# Patient Record
Sex: Male | Born: 1950 | Race: Black or African American | Hispanic: No | Marital: Single | Smoking: Former smoker
Health system: Southern US, Community
[De-identification: ages and names within clinical notes are randomized; demographics above are authoritative.]

## PROBLEM LIST (undated history)

## (undated) DIAGNOSIS — I779 Disorder of arteries and arterioles, unspecified: Secondary | ICD-10-CM

## (undated) DIAGNOSIS — I2510 Atherosclerotic heart disease of native coronary artery without angina pectoris: Secondary | ICD-10-CM

## (undated) DIAGNOSIS — I82409 Acute embolism and thrombosis of unspecified deep veins of unspecified lower extremity: Secondary | ICD-10-CM

## (undated) DIAGNOSIS — D649 Anemia, unspecified: Secondary | ICD-10-CM

## (undated) DIAGNOSIS — E785 Hyperlipidemia, unspecified: Secondary | ICD-10-CM

## (undated) DIAGNOSIS — M069 Rheumatoid arthritis, unspecified: Secondary | ICD-10-CM

## (undated) DIAGNOSIS — E119 Type 2 diabetes mellitus without complications: Secondary | ICD-10-CM

## (undated) DIAGNOSIS — J449 Chronic obstructive pulmonary disease, unspecified: Secondary | ICD-10-CM

## (undated) DIAGNOSIS — M359 Systemic involvement of connective tissue, unspecified: Secondary | ICD-10-CM

## (undated) DIAGNOSIS — M1990 Unspecified osteoarthritis, unspecified site: Secondary | ICD-10-CM

## (undated) DIAGNOSIS — J40 Bronchitis, not specified as acute or chronic: Secondary | ICD-10-CM

## (undated) DIAGNOSIS — I10 Essential (primary) hypertension: Secondary | ICD-10-CM

## (undated) DIAGNOSIS — I739 Peripheral vascular disease, unspecified: Secondary | ICD-10-CM

## (undated) DIAGNOSIS — I6529 Occlusion and stenosis of unspecified carotid artery: Secondary | ICD-10-CM

## (undated) DIAGNOSIS — M489 Spondylopathy, unspecified: Secondary | ICD-10-CM

## (undated) DIAGNOSIS — K219 Gastro-esophageal reflux disease without esophagitis: Secondary | ICD-10-CM

## (undated) HISTORY — DX: Essential (primary) hypertension: I10

## (undated) HISTORY — DX: Anemia, unspecified: D64.9

## (undated) HISTORY — DX: Bronchitis, not specified as acute or chronic: J40

## (undated) HISTORY — DX: Rheumatoid arthritis, unspecified: M06.9

## (undated) HISTORY — PX: OTHER SURGICAL HISTORY: SHX169

## (undated) HISTORY — DX: Atherosclerotic heart disease of native coronary artery without angina pectoris: I25.10

## (undated) HISTORY — DX: Spondylopathy, unspecified: M48.9

## (undated) HISTORY — DX: Occlusion and stenosis of unspecified carotid artery: I65.29

## (undated) HISTORY — DX: Acute embolism and thrombosis of unspecified deep veins of unspecified lower extremity: I82.409

## (undated) HISTORY — DX: Hyperlipidemia, unspecified: E78.5

## (undated) HISTORY — DX: Gastro-esophageal reflux disease without esophagitis: K21.9

## (undated) HISTORY — DX: Type 2 diabetes mellitus without complications: E11.9

## (undated) HISTORY — DX: Peripheral vascular disease, unspecified: I73.9

## (undated) HISTORY — PX: SURGERY SCROTAL / TESTICULAR: SUR1316

## (undated) HISTORY — DX: Disorder of arteries and arterioles, unspecified: I77.9

## (undated) HISTORY — DX: Chronic obstructive pulmonary disease, unspecified: J44.9

## (undated) HISTORY — PX: CERVICAL DISC SURGERY: SHX588

---

## 2005-10-31 ENCOUNTER — Ambulatory Visit (HOSPITAL_COMMUNITY): Admission: RE | Admit: 2005-10-31 | Discharge: 2005-10-31 | Payer: Self-pay | Admitting: Vascular Surgery

## 2005-11-06 ENCOUNTER — Inpatient Hospital Stay (HOSPITAL_COMMUNITY): Admission: RE | Admit: 2005-11-06 | Discharge: 2005-11-09 | Payer: Self-pay | Admitting: Vascular Surgery

## 2005-11-06 HISTORY — PX: FEMORAL-POPLITEAL BYPASS GRAFT: SHX937

## 2005-11-07 ENCOUNTER — Encounter: Payer: Self-pay | Admitting: Vascular Surgery

## 2005-12-03 ENCOUNTER — Ambulatory Visit: Payer: Self-pay | Admitting: Nurse Practitioner

## 2005-12-04 ENCOUNTER — Ambulatory Visit: Payer: Self-pay | Admitting: Cardiology

## 2005-12-04 ENCOUNTER — Inpatient Hospital Stay (HOSPITAL_COMMUNITY): Admission: AD | Admit: 2005-12-04 | Discharge: 2005-12-06 | Payer: Self-pay | Admitting: Cardiology

## 2006-02-07 ENCOUNTER — Ambulatory Visit (HOSPITAL_COMMUNITY): Admission: RE | Admit: 2006-02-07 | Discharge: 2006-02-07 | Payer: Self-pay | Admitting: Neurosurgery

## 2006-02-17 ENCOUNTER — Ambulatory Visit: Admission: RE | Admit: 2006-02-17 | Discharge: 2006-02-17 | Payer: Self-pay | Admitting: Neurosurgery

## 2006-02-19 ENCOUNTER — Ambulatory Visit: Payer: Self-pay | Admitting: Internal Medicine

## 2006-02-19 ENCOUNTER — Emergency Department (HOSPITAL_COMMUNITY): Admission: EM | Admit: 2006-02-19 | Discharge: 2006-02-19 | Payer: Self-pay | Admitting: *Deleted

## 2006-02-28 ENCOUNTER — Ambulatory Visit: Payer: Self-pay | Admitting: Cardiology

## 2006-03-03 ENCOUNTER — Ambulatory Visit: Payer: Self-pay | Admitting: Cardiology

## 2006-03-14 ENCOUNTER — Ambulatory Visit: Payer: Self-pay | Admitting: Vascular Surgery

## 2006-04-22 ENCOUNTER — Ambulatory Visit: Payer: Self-pay | Admitting: Vascular Surgery

## 2006-05-27 ENCOUNTER — Ambulatory Visit: Payer: Self-pay | Admitting: Vascular Surgery

## 2006-08-19 ENCOUNTER — Ambulatory Visit: Payer: Self-pay | Admitting: Vascular Surgery

## 2006-09-10 ENCOUNTER — Ambulatory Visit: Payer: Self-pay | Admitting: Internal Medicine

## 2006-09-10 LAB — CONVERTED CEMR LAB
Chlamydia, Swab/Urine, PCR: NEGATIVE
GC Probe Amp, Urine: NEGATIVE

## 2006-09-16 ENCOUNTER — Ambulatory Visit: Payer: Self-pay | Admitting: Cardiology

## 2006-11-11 ENCOUNTER — Ambulatory Visit: Payer: Self-pay | Admitting: Vascular Surgery

## 2007-05-26 ENCOUNTER — Ambulatory Visit: Payer: Self-pay | Admitting: Vascular Surgery

## 2008-02-16 ENCOUNTER — Ambulatory Visit: Payer: Self-pay | Admitting: Vascular Surgery

## 2008-04-16 DIAGNOSIS — I2510 Atherosclerotic heart disease of native coronary artery without angina pectoris: Secondary | ICD-10-CM | POA: Insufficient documentation

## 2008-04-16 DIAGNOSIS — K219 Gastro-esophageal reflux disease without esophagitis: Secondary | ICD-10-CM | POA: Insufficient documentation

## 2008-04-16 DIAGNOSIS — J449 Chronic obstructive pulmonary disease, unspecified: Secondary | ICD-10-CM | POA: Insufficient documentation

## 2008-04-16 DIAGNOSIS — I739 Peripheral vascular disease, unspecified: Secondary | ICD-10-CM

## 2008-04-16 DIAGNOSIS — M069 Rheumatoid arthritis, unspecified: Secondary | ICD-10-CM | POA: Insufficient documentation

## 2008-04-16 DIAGNOSIS — R7309 Other abnormal glucose: Secondary | ICD-10-CM | POA: Insufficient documentation

## 2008-06-08 ENCOUNTER — Ambulatory Visit: Payer: Self-pay | Admitting: Cardiology

## 2008-06-09 ENCOUNTER — Encounter: Payer: Self-pay | Admitting: Physician Assistant

## 2008-06-15 ENCOUNTER — Ambulatory Visit: Payer: Self-pay | Admitting: Cardiology

## 2008-06-15 ENCOUNTER — Encounter: Payer: Self-pay | Admitting: Physician Assistant

## 2008-07-04 ENCOUNTER — Encounter: Payer: Self-pay | Admitting: Physician Assistant

## 2008-07-08 ENCOUNTER — Encounter: Payer: Self-pay | Admitting: Cardiology

## 2008-07-08 ENCOUNTER — Ambulatory Visit: Payer: Self-pay | Admitting: Cardiology

## 2008-07-14 ENCOUNTER — Encounter: Payer: Self-pay | Admitting: Cardiology

## 2008-07-18 ENCOUNTER — Ambulatory Visit: Payer: Self-pay | Admitting: Cardiology

## 2008-07-20 ENCOUNTER — Ambulatory Visit: Payer: Self-pay | Admitting: Cardiovascular Disease

## 2008-07-20 ENCOUNTER — Inpatient Hospital Stay (HOSPITAL_BASED_OUTPATIENT_CLINIC_OR_DEPARTMENT_OTHER): Admission: RE | Admit: 2008-07-20 | Discharge: 2008-07-20 | Payer: Self-pay | Admitting: Cardiovascular Disease

## 2008-08-05 ENCOUNTER — Ambulatory Visit: Payer: Self-pay | Admitting: Cardiology

## 2008-08-12 ENCOUNTER — Encounter: Payer: Self-pay | Admitting: Cardiology

## 2008-09-01 ENCOUNTER — Ambulatory Visit: Payer: Self-pay | Admitting: Cardiovascular Disease

## 2008-10-24 ENCOUNTER — Ambulatory Visit: Payer: Self-pay | Admitting: Cardiovascular Disease

## 2008-10-24 ENCOUNTER — Ambulatory Visit: Payer: Self-pay | Admitting: Vascular Surgery

## 2009-02-07 ENCOUNTER — Ambulatory Visit: Payer: Self-pay | Admitting: Vascular Surgery

## 2009-04-11 ENCOUNTER — Inpatient Hospital Stay (HOSPITAL_COMMUNITY): Admission: RE | Admit: 2009-04-11 | Discharge: 2009-04-14 | Payer: Self-pay | Admitting: Neurosurgery

## 2010-02-06 NOTE — Assessment & Plan Note (Signed)
Summary: PVD   Visit Type:  New PV consult  CC:  referral per Dr. Andee Lineman.  History of Present Illness: 60 year-old gentleman here for evaluation of lower extremity PAD. He has had left fem-pop bypass in 2007, but has not been followed regularly since then. He underwent cath recently and was found to have bilateral iliac disease. Referred here for further evaluation.  The pt is very active - he rides a bike, does yardwork, and goes fishing a lot. He has some leg pain, but it is nonexertional. He describes left thigh pain and pain behind knee, but this is nonexertional. He occasionally has left calf pain with ambulation, but this is not frequent. He denies foot pain or ulcerations.  He has been followed by Dr Hart Rochester since his fem-pop and had ABI's in Feb 2010 that were greater than 1.0 bilaterally.      Current Medications (verified): 1)  Celebrex 200 Mg  Caps (Celecoxib) .... Take 1 Capsule By Mouth Once A Day 2)  Hydrochlorothiazide 25 Mg  Tabs (Hydrochlorothiazide) .... Take 1 Tablet By Mouth Once A Day 3)  Adult Aspirin Low Strength 81 Mg  Tbdp (Aspirin) .... Take 1 Tablet By Mouth Once A Day 4)  Atenolol 25 Mg  Tabs (Atenolol) .... Take 1 Tablet By Mouth Once A Day 5)  Glipizide 5 Mg  Tabs (Glipizide) .... Take 1 Tablet By Mouth Once A Day 6)  Lipitor 20 Mg Tabs (Atorvastatin Calcium) .... Take One Tablet By Mouth Daily. Place On File, Pt Will Call When Need 7)  Prevacid Solutab 30 Mg Tbdp (Lansoprazole) .... Take 1 Tablet By Mouth Once A Day  Allergies (verified): No Known Drug Allergies  Past History:  Past medical, surgical, family and social histories (including risk factors) reviewed, and no changes noted (except as noted below).  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 CAD (ICD-414.00),  nonobstructive by cath 2010. GASTROESOPHAGEAL REFLUX DISEASE (ICD-530.81) RHEUMATOID ARTHRITIS (ICD-714.0)  Past Surgical History: Reviewed history from 04/16/2008 and no changes required.  1. Repair of left arm fracture, elbow.  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  3. Testicular surgery.  Family History: Reviewed history from 04/16/2008 and no changes required.  Mother had a heart attack at age 40 and is deceased.  Father had a stroke and is deceased at age 60.  He has a sister who is  alive at 19 with high blood pressure.  He also has a brother that is  alive at 75 with high blood pressure.  Social History: Reviewed history from 04/16/2008 and no changes required.   The patient lives in [place].  He does not smoke and he  drinks an occasional beer.  He quit smoking approximately a year ago.  Review of Systems       positive for arthritic pain in hands, chest pain. Otherwise negative except as per HPI.  Vital Signs:  Patient profile:   60 year old male Height:      70 inches Weight:      188 pounds BMI:     27.07 Pulse rhythm:   regular Resp:     18 per minute BP sitting:   140 / 90  (left arm) Cuff size:   large  Vitals Entered By: Vikki Ports (September 01, 2008 10:08 AM)  Serial Vital Signs/Assessments:  Time      Position  BP  Pulse  Resp  Temp     By 10:10 AM  R Arm     14/90                          Vikki Ports   Physical Exam  General:  Pt is well-developed, alert and oriented, no acute distress HEENT: normal Neck: no thyromegaly           JVP normal, carotid upstrokes normal without bruits Lungs: CTA Chest: equal expansion  CV: Apical impulse nondisplaced, RRR without murmur or gallop Abd: soft, NT, positive BS, no HSM, no bruit Back: no CVA tenderness Ext: no clubbing, cyanosis, or edema        femoral pulses 2+ without bruits        pedal pulses 2+ on right and 1+ on left Skin: warm, dry, no rash Neuro: CNII-XII intact,strength 5/5 =  b/l    EKG  Procedure date:  09/01/2008  Findings:      Right ABI 1.22 and left 1.03. Brisk, triphasic waveforms at the ankle bilaterally.  Impression & Recommendations:  Problem # 1:  PERIPHERAL VASCULAR DISEASE (ICD-443.9) The pt does not have typical claudication. His ABI's were reviewed and are normal bilaterally. I reviewed his cath images, and he does have heavily calcified common iliacs at the aortoiliac junction. The degree of stenosis is very difficult to assess because of calcification. Based on the ABI's and strong bilateral femoral pulses, I do not think he has hemodynamically significant iliac stenosis.   We discussed the importance of abstaining from smoking (quit one year ago), taking a statin (he is on lipitor), and taking ASA.   He will f/u with Dr Hart Rochester, which is scheduled next month, and will continue graft surveillance through the [facility] office.  Patient Instructions: 1)  Your physician recommends that you schedule a follow-up appointment as needed.

## 2010-02-07 ENCOUNTER — Ambulatory Visit: Admit: 2010-02-07 | Payer: Self-pay | Admitting: Vascular Surgery

## 2010-02-08 ENCOUNTER — Encounter (INDEPENDENT_AMBULATORY_CARE_PROVIDER_SITE_OTHER): Payer: Medicare Other

## 2010-02-08 DIAGNOSIS — I739 Peripheral vascular disease, unspecified: Secondary | ICD-10-CM

## 2010-02-08 DIAGNOSIS — Z48812 Encounter for surgical aftercare following surgery on the circulatory system: Secondary | ICD-10-CM

## 2010-02-16 NOTE — Procedures (Unsigned)
BYPASS GRAFT EVALUATION  INDICATION:  Followup left femoropopliteal graft.  HISTORY: Diabetes:  Yes. Cardiac:  Yes. Hypertension:  Yes. Smoking:  Yes. Previous Surgery:  Left femoropopliteal graft placed 11/06/2005.  SINGLE LEVEL ARTERIAL EXAM                              RIGHT              LEFT Brachial:                    142                144 Anterior tibial:             135                145 Posterior tibial:            130                152 Peroneal: Ankle/brachial index:        0.94               1.06  PREVIOUS ABI:  Date: 02/07/2009  RIGHT:  1.10  LEFT:  1.15  LOWER EXTREMITY BYPASS GRAFT DUPLEX EXAM:  DUPLEX:  Patent left femoropopliteal graft with elevated velocities at the distal segment.  IMPRESSION: 1. Normal ankle-brachial indices; however, there has been a decrease     since previous exam 1 year ago. 2. Patent left femoropopliteal graft with high end 50% to 75% stenosis     at the distal segment.  This is apparently a new finding.  ___________________________________________ Quita Skye. Hart Rochester, M.D.  LT/MEDQ  D:  02/08/2010  T:  02/08/2010  Job:  [id number]

## 2010-03-13 ENCOUNTER — Ambulatory Visit (INDEPENDENT_AMBULATORY_CARE_PROVIDER_SITE_OTHER): Payer: Medicare Other | Admitting: Vascular Surgery

## 2010-03-13 DIAGNOSIS — I70219 Atherosclerosis of native arteries of extremities with intermittent claudication, unspecified extremity: Secondary | ICD-10-CM

## 2010-03-14 NOTE — Assessment & Plan Note (Signed)
OFFICE VISIT  OLA, FAWVER DOB:  April 03, 1950                                       03/13/2010 CHART#:[id number]  Patient returns today complaining of some intermittent discomfort behind his left knee.  He is concerned that this is related to his bypass graft.  He has no history of orthopedic problems in the left leg and saw me for similar complaints in February of 2010.  He states he is able to walk fairly good distances without specific claudication symptoms.  He denies any rest pain or numbness in the feet.  No symptoms are present in the right leg.  REVIEW OF SYSTEMS:  He denies any chest pain, dyspnea on exertion, PND, orthopnea, chronic cough, or hemoptysis.  All other systems are negative.  PHYSICAL EXAMINATION:  Blood pressure 141/87, heart rate 102, respirations 16.  Generally, he is a well-developed and well-nourished male in no apparent distress.  Lungs:  Clear to auscultation.  No rhonchi or wheezing.  Cardiovascular:  Regular rhythm.  No murmurs. Carotid pulses are 3+.  No audible bruits.  Abdomen:  Soft, nontender with no masses.  Lower extremity exam reveals 3+ femoral and 2+ popliteal pulses bilaterally.  Recently he had a venous duplex exam of his left leg bypass which revealed a normal ABI in the left leg with widely patent graft.  There is some slight elevation of velocities near the distal anastomosis, suggesting a possible 50% narrowing in the lower end of the Gore-Tex graft.  I have reassured him regarding his bypass, and he continues to have his knee pain.  I think an orthopedic consult would be indicated.  We will see him in 6 months with a repeat scan of his graft and look specifically at the distal end of the Gore-Tex graft to see if there is any progressive narrowing.  This graft has been in place for 5 years.    Quita Skye Hart Rochester, M.D. Electronically Signed  JDL/MEDQ  D:  03/13/2010  T:  03/14/2010  Job:  [id number]

## 2010-03-28 LAB — BASIC METABOLIC PANEL
BUN: 17 mg/dL (ref 6–23)
CO2: 28 mEq/L (ref 19–32)
Calcium: 9.1 mg/dL (ref 8.4–10.5)
Chloride: 104 mEq/L (ref 96–112)
Creatinine, Ser: 0.87 mg/dL (ref 0.4–1.5)
GFR calc Af Amer: >60 mL/min (ref >60)
GFR calc non Af Amer: >60 mL/min (ref >60)
Glucose, Bld: 176 mg/dL — ABNORMAL HIGH (ref 70–99)
Potassium: 4.5 mEq/L (ref 3.5–5.1)
Sodium: 140 mEq/L (ref 135–145)

## 2010-03-28 LAB — CBC
HCT: 42.1 % (ref 39.0–52.0)
Hemoglobin: 14.1 g/dL (ref 13.0–17.0)
MCHC: 33.5 g/dL (ref 30.0–36.0)
MCV: 91.2 fL (ref 78.0–100.0)
Platelets: 245 10*3/uL (ref 150–400)
RBC: 4.61 MIL/uL (ref 4.22–5.81)
RDW: 14.3 % (ref 11.5–15.5)
WBC: 7.7 10*3/uL (ref 4.0–10.5)

## 2010-03-28 LAB — GLUCOSE, CAPILLARY
Glucose-Capillary: 149 mg/dL — ABNORMAL HIGH (ref 70–99)
Glucose-Capillary: 160 mg/dL — ABNORMAL HIGH (ref 70–99)
Glucose-Capillary: 184 mg/dL — ABNORMAL HIGH (ref 70–99)
Glucose-Capillary: 202 mg/dL — ABNORMAL HIGH (ref 70–99)
Glucose-Capillary: 305 mg/dL — ABNORMAL HIGH (ref 70–99)

## 2010-03-28 LAB — SURGICAL PCR SCREEN

## 2010-04-15 LAB — POCT I-STAT GLUCOSE
Glucose, Bld: 128 mg/dL — ABNORMAL HIGH (ref 70–99)
Operator id: 194801

## 2010-05-22 NOTE — Procedures (Signed)
BYPASS GRAFT EVALUATION   INDICATION:  Follow up left femoral-popliteal artery bypass graft.   HISTORY:  Diabetes:  Yes.  Cardiac:  Possible, per patient.  Hypertension:  Yes.  Smoking:  Quit.  Previous Surgery:  Left fem-pop bypass graft, 11/06/05, by Dr. Hart Rochester.   SINGLE LEVEL ARTERIAL EXAM                               RIGHT              LEFT  Brachial:                    147                146  Anterior tibial:             151                150  Posterior tibial:            124                143  Peroneal:  Ankle/brachial index:        1.03               1.02   PREVIOUS ABI:  Date: 11/11/06  RIGHT:  >1.0  LEFT:  >1.0   LOWER EXTREMITY BYPASS GRAFT DUPLEX EXAM:   DUPLEX:  Doppler arterial waveforms appear triphasic proximal to,  within, and distal to the left femoral-popliteal artery bypass graft.   IMPRESSION:  1. Stable ankle brachial indices bilaterally.  2. Patent left femoral-popliteal artery bypass graft.  3. No significant changes from previous study.      ___________________________________________  Quita Skye Hart Rochester, M.D.   AS/MEDQ  D:  05/26/2007  T:  05/26/2007  Job:  [id number]

## 2010-05-22 NOTE — Cardiovascular Report (Signed)
NAMEJAKAYDEN, [name]               ACCOUNT NO.:  [id number]   MEDICAL RECORD NO.:  [id number]          PATIENT TYPE:  OIB   LOCATION:  1961                         FACILITY:  [facility]   PHYSICIAN:  Peter C. Eden Emms, MD, FACCDATE OF BIRTH:  17-Jun-1950   DATE OF PROCEDURE:  07/20/2008  DATE OF DISCHARGE:  07/20/2008                            CARDIAC CATHETERIZATION   A 60 year old patient, seen by Dr. [name] in [place], chest pain with  abnormal Myoview, suggesting inferior wall ischemia.   Cine catheterization was done with 5-French catheter from the right  femoral artery.   The patient had a vague history of peripheral vascular disease.  It was  not clearly outlined to the patient's H and P.  From the right femoral  artery, we were able to insert a sheath without difficulty.  However,  the patient had an extremely calcified distal aorta at the bifurcation  of the iliacs.  It took [place] about 15 minutes to finally pass this area  using the right coronary catheter and a slick wire.   We did a sheath injection, a right iliac injection, and at the end of  the case the distal aortic injection.  The patient has severe bilateral  iliac artery stenoses.   The left side actually seems worse than the right.   Arteriography.   The left main coronary artery was normal.   Left anterior descending artery was normal, it was large artery, and  wrapped the apex.  First diagonal branch was normal.   The previously described systolic compression of the mid LAD was not  appreciated.   Circumflex coronary artery was large and somewhat codominant.  There is  a very small intermediate branch.  The first obtuse marginal branch was  somewhat small and normal.  The second obtuse marginal branch was  extremely large and branching and normal.   Right coronary artery was somewhat small and codominant.  It was normal.   RAO ventriculography.  RAO ventriculography showed minimal inferior wall  hypokinesis.   EF was 50-55%.  There was no gradient across the aortic  valve and no MR.  Aortic pressure was 137/91.  LV pressure was 137/21.   As indicated distal aortography showed severe peripheral vascular  disease.   IMPRESSION:  The patient appeared to have another false positive  Myoview.  He apparently had one back in 2007 when he had his last heart  cath.  He does not have coronary artery disease.  However, he does  appear to have severe peripheral vascular disease.  I think it would be  worthwhile for him to have a followup duplex with ABIs and see Dr.  Excell Seltzer in our [place] office.      [name]. Eden Emms, MD, [facility]  Electronically Signed     PCN/MEDQ  D:  07/20/2008  T:  07/20/2008  Job:  [id number]

## 2010-05-22 NOTE — Assessment & Plan Note (Signed)
[facility]                          [facility] NOTE   [name], [name]                        MRN:          [id number]  DATE:09/16/2006                            DOB:          1950-09-27    HISTORY OF PRESENT ILLNESS:  The patient is a 60 year old African-  American male with a history of nonobstructive coronary artery disease.  The patient was initially scheduled for neck surgery by Hilda Lias,  M.D.  However, the patient now reports his neck pain has resolved and he  is not planning to having any surgery done.  He did have a cardiology  study done as a preoperative screen which showed a normal ejection  fraction and no ischemia.  The patient had done quite well from a  cardiovascular respect.  He reports no shortness of breath, chest pain,  orthopnea, PND, palpitations, or syncope.   MEDICATIONS:  1. Celebrex 20 mg p.o. daily.  2. Hydrochlorothiazide 25 mg p.o. daily.  3. Prevacid 30 mg p.o. daily.  4. Lipitor 20 mg p.o. daily.  5. Aspirin 81 mg p.o. daily.  6. Methotrexate.  7. Atenolol.  8. Glipizide.  9. Folic acid.   PHYSICAL EXAMINATION:  VITAL SIGNS:  Blood pressure 128/88, pulse rate  106, weight 189 pounds.  NECK:  Normal carotid upstrokes, no carotid bruits.  LUNGS:  Clear breath sounds bilaterally.  HEART:  Regular rate and rhythm, normal S1 and S2, no murmurs, rubs, or  gallops.  ABDOMEN:  Soft and nontender.  No rebound or guarding.  Good bowel  sounds.  EXTREMITIES:  No cyanosis, clubbing, or edema.  NEUROLOGY:  The patient is alert and oriented.  Nonfocal.   PROBLEM LIST:  1. Coronary artery disease.      a.     Coronary calcification of the left anterior descending.      b.     Nonobstructing coronary artery disease by catheterization in       November 2007.      c.     Normal ejection fraction by recent Cardiolite.      d.     Atypical chest pain, resolved.  2. Peripheral vascular disease.      a.      Status post left fem/pop secondary to claudication.      b.     Normal ankle brachial indexes bilaterally.  3. Chronic obstructive pulmonary disease.  4. Rheumatoid arthritis on methotrexate.  5. Borderline diabetes mellitus.  6. Subcentimeter nodule on CT scan.  7. Gastroesophageal reflux disease.  8. Cervical spine injury, resolved.   PLAN:  1. The patient has done well from a cardiovascular perspective.  He      can continue on aspirin and Lipitor.  The patient states that he      has some problems with impotence related to atenolol and I have      suggested that this can be discontinued.  2. The patient can follow up with [place] in 1 year.     [name], MD,FACC  Electronically Signed    GED/MedQ  DD:  09/16/2006  DT: 09/16/2006  Job #: [id number]

## 2010-05-22 NOTE — Assessment & Plan Note (Signed)
[facility]                          [facility] NOTE   VA, BROADWELL                        MRN:          [id number]  DATE:06/08/2008                            DOB:          1950-09-26    PRIMARY CARDIOLOGIST:  [name], MD,FACC   REASON FOR VISIT:  Routine followup.   [name] is a 60 year old male with history of nonobstructive CAD by  previous study in 2007, who has not been seen in our clinic since  September 2008, by Dr. Andee Lineman.   [name] reports compliance with his medications, has not smoked on a  regular basis in 2 years, and had a lipid profile earlier this year  notable for an LDL of 102, on low-dose Lipitor.   Clinically, he notes some exertional dyspnea, but denies any frank  exertional chest pain.  However, he was referred for an exercise stress  test, by Dr. Sherril Croon back in December, when he presented with chest pain.  He was able to achieve 96% of PMHR, reported no associated chest pain,  and had no diagnostic ST changes.  Perfusion imaging was interpreted as  normal, by Dr. [name], with calculated EF of 41%.   EKG in our office today indicates NSR at 84 bpm with normal axis and no  ischemic changes.   HOME MEDICATIONS:  1. Aspirin 81 daily.  2. Lipitor 10 daily.  3. Hydrochlorothiazide 25 daily.  4. Celebrex 200 daily.  5. Glipizide 10 b.i.d.  6. Lisinopril 10 daily.  7. Lansoprazole 30 daily.   PHYSICAL EXAMINATION:  VITAL SIGNS:  Blood pressure 123/77, pulse 83,  regular, weight 187.  GENERAL:  A 60 year old male, sitting upright, in no distress.  HEENT:  Normocephalic, atraumatic.  NECK:  Palpable bilateral carotid pulses without bruits; no JVD.  LUNGS:  Diminished breath sounds, without crackles or wheezes.  HEART:  Regular rate and rhythm with no significant murmurs.  No rubs.  ABDOMEN:  Soft, nontender.  EXTREMITIES:  Diminished pulses with no significant pedal edema.  NEURO:  No focal  deficit.   IMPRESSION:  1. Nonobstructive CAD.      a.     By cardiac catheterization, November 2007.      b.     Preserved LVF.      c.     Negative, adequate GXT Cardiolite, December 2009.  2. Peripheral arterial disease.      a.     Status post left fem-pop BPG, October 2007, followed by Dr.       Josephina Gip.  3. COPD/emphysema.      a.     History of tobacco.  4. Stable right pulmonary nodules, followed by Dr. Sherril Croon.  5. Type 2 diabetes mellitus.  6. Hypertension.  7. Dyslipidemia.  8. Rheumatoid arthritis.   PLAN:  1. A 2-D echocardiogram for reassessment of left ventricular function.      If this suggests any significant decline in his LVF, then we will      need to reassess his clinical status as to whether or not he needs  a repeat cardiac catheterization.  2. Increase Lipitor to 20 mg daily for aggressive lipid management,      with target LDL of 70 or less, if feasible.  3. Schedule return clinic followup with myself and Dr. Andee Lineman in 3      months.      Gene Serpe, PA-C  Electronically Signed      [name], MD,FACC  Electronically Signed   GS/MedQ  DD: 06/08/2008  DT: 06/09/2008  Job #: [id number]   cc:   Doreen Beam, MD

## 2010-05-22 NOTE — Procedures (Signed)
BYPASS GRAFT EVALUATION   INDICATION:  Followup left femoral-popliteal bypass graft.   HISTORY:  Diabetes:  Yes  Cardiac:  No  Hypertension:  Yes  Smoking:  Quit  Previous Surgery:  See above   SINGLE LEVEL ARTERIAL EXAM                               RIGHT              LEFT  Brachial:                    139                128  Anterior tibial:             136                145  Posterior tibial:            123                132  Peroneal:  Ankle/brachial index:        0.9                1.04   PREVIOUS ABI:  Date: 05/27/2006  RIGHT:  1.4  LEFT:  1.02   LOWER EXTREMITY BYPASS GRAFT DUPLEX EXAM:   DUPLEX:  Patent left femoral-popliteal bypass graft with no evidence of  focal stenosis.   IMPRESSION:  1. Patent left femoral-popliteal bypass graft with no evidence of      focal stenosis.  2. Normal ABI with biphasic Doppler wave form in bilateral legs,      status post left femoral-popliteal bypass graft.   ___________________________________________  Quita Skye. Hart Rochester, M.D.   MG/MEDQ  D:  08/19/2006  T:  08/20/2006  Job:  [id number]

## 2010-05-22 NOTE — Assessment & Plan Note (Signed)
[facility]                          [facility] NOTE   [name], [name]                        MRN:          [id number]  DATE:07/18/2008                            DOB:          1950/10/22    [name] is a 60 year old gentleman, who presents for evaluation of  chest pain and an abnormal nuclear study.  The patient does have a  history of chest pain.  He underwent cardiac catheterization in November  2007.  At that time, his LV function was preserved.  There was  midsystolic contraction of the mid LAD, but no high-grade disease was  noted.  He otherwise had nonobstructive disease.  He recently was  admitted to [facility] with atypical chest pain.  He ruled out  for myocardial infarction with serial enzymes.  A D-dimer was elevated,  but a CT was performed on July 08, 2008.  There was no pulmonary embolus.  There was emphysematous changes with stable densities in the right lung.  The patient was subsequently scheduled to have a Myoview.  This was  performed on July 14, 2008.  The patient exercised in the stage 3. There  was no chest pain and no electrocardiographic changes.  It was  interpreted by Dr. Diona Browner as showing a medium-sized reversible defect  in the mid inferior septal, mid inferior, and apical inferior wall.  This was felt to be demonstrative mild ischemia.  Ejection fraction was  48%.  Also note, he had an echocardiogram performed on June 9 of this  year that showed normal LV function.  There was a mild aortic  insufficiency.  There was a mild tricuspid regurgitation.  The patient  now returns for further evaluation of its abnormal nuclear study.  He  continues to have occasional chest pain.  This has been present 4  months.  It is in various locations in the chest.  It can be on the  right side, left side or in the substernal area.  It lasts anywhere from  30 seconds to 1 minute.  It is not pleuritic or positional nor  this  related to food.  It is not exertional.  He does state that when he  presses his chest that occasionally feels worse.  There is no associated  nausea, vomiting, shortness of breath, or diaphoresis.  It does not  radiate.  He also is having some neck pain at times.  Note, he does not  have exertional chest pain.   REVIEW OF SYSTEMS:  He denies any fevers or chills, productive cough,  hemoptysis, dysphagia, odynophagia, melena, hematochezia, dysuria, or  hematuria.  The remaining systems are negative other than neck pain.   MEDICATIONS:  1. Celebrex 200 mg p.o. daily.  2. Hydrochlorothiazide 25 mg p.o. daily.  3. Lipitor 10 mg p.o. daily.  4. Aspirin 81 mg p.o. daily.  5. Glipizide 5 mg p.o. b.i.d.  6. Lansoprazole 30 mg p.o. daily.   FAMILY HISTORY:  Negative for coronary artery disease.   PAST MEDICAL HISTORY:  Significant for hypertension, hyperlipidemia, and  diabetes.  He has COPD.  He has a history of peripheral vascular disease  and has had previous surgery for his left lower extremity.  He also has  rheumatoid arthritis and gastroesophageal disease.   SOCIAL HISTORY:  The patient states he quit smoking approximately 1-1/2  years ago, but does smoke 1-2 cigarettes on occasion.   PHYSICAL EXAMINATION:  VITAL SIGNS:  Today shows a blood pressure of  123/84, his pulse is 83.  He weighs 187.8 pounds.  GENERAL:  He is well developed, well nourished in no acute distress.  SKIN:  Warm and dry.  Does not appear to be depressed.  There is no  peripheral clubbing.  BACK:  Normal.  HEENT:  Normal eyelids.  NECK:  Supple with normal upstroke bilaterally.  No bruits heard.  There  is no jugular venous distention.  I cannot appreciate thyromegaly.  CHEST:  Diminished breath sounds throughout.  CARDIOVASCULAR:  Regular rate and rhythm.  Normal S1 and S2.  There are  no murmurs, rubs, or gallops noted.  ABDOMEN:  Nontender and nondistended.  Positive bowel sounds.  No   hepatosplenomegaly.  No mass appreciated.  There is no abdominal bruit.  He has 2+ femoral pulse on the right with no bruit.  EXTREMITIES:  No edema.  I could palpate no cords.  NEUROLOGIC:  Grossly intact.   DIAGNOSES:  1. Chest pain - the patient's symptoms are extremely atypical.  There      was some tenderness to palpation on physical examination.  However,      given his abnormal Myoview and ongoing symptoms, I think we will      need to be definitive with cardiac catheterization.  The risk and      benefits have been discussed and agrees to proceed.  We will      arrange this is an outpatient.  Note, he also has multiple risk      factors.  2. Abnormal stress test - chest pain.  3. Diabetes mellitus.  4. Hypertension - his blood pressure is controlled with present      medications.  5. Hyperlipidemia - he will continue his statin.  6. Tobacco abuse - we discussed importance of discontinuing.  7. Peripheral vascular disease.  8. Rheumatoid arthritis.   We will make further recommendations once we know his anatomy.     Brett Frieze [name], MD, [facility]  Electronically Signed    BSC/MedQ  DD: 07/18/2008  DT: 07/19/2008  Job #: [id number]   cc:   Brett Beam, MD

## 2010-05-22 NOTE — Procedures (Signed)
BYPASS GRAFT EVALUATION   INDICATION:  Followup left fem-pop bypass graft.   HISTORY:  Diabetes:  Yes  Cardiac:  No  Hypertension:  Yes  Smoking:  Quit  Previous Surgery:  Please see above   SINGLE LEVEL ARTERIAL EXAM                               RIGHT              LEFT  Brachial:                    119                130  Anterior tibial:             152                148  Posterior tibial:            125                134  Peroneal:  Ankle/brachial index:        1.17               1.14   PREVIOUS ABI:  Date: 08/19/2006  RIGHT:  0.98  LEFT:  1.04   LOWER EXTREMITY BYPASS GRAFT DUPLEX EXAM:   DUPLEX:  Left fem-pop bypass graft with no evidence of focal stenosis.   IMPRESSION:  Normal ABI with biphasic Doppler waveform noted in  bilateral legs.  Status post left fem-pop bypass graft.   ___________________________________________  Quita Skye. Hart Rochester, M.D.   MG/MEDQ  D:  11/11/2006  T:  11/12/2006  Job:  [id number]

## 2010-05-22 NOTE — Procedures (Signed)
BYPASS GRAFT EVALUATION   INDICATION:  Left groin and knee pain for 1 week.   HISTORY:  Diabetes:  Yes.  Cardiac:  Yes.  Hypertension:  Yes.  Smoking:  Former smoker.  Previous Surgery:  Left fem-pop bypass graft on 11/06/2005 by Dr.  Hart Rochester.   SINGLE LEVEL ARTERIAL EXAM                               RIGHT              LEFT  Brachial:                    138                132  Anterior tibial:             144                150  Posterior tibial:            112                120  Peroneal:                                       144  Ankle/brachial index:        >1.0               >1.0   PREVIOUS ABI:  Date: 05/26/07  RIGHT:  >1.0  LEFT:  >1.0   LOWER EXTREMITY BYPASS GRAFT DUPLEX EXAM:   DUPLEX:  Doppler arterial waveforms are biphasic proximal to, within,  and distal to the bypass graft.   IMPRESSION:  1. Ankle brachial indices are stable from previous study bilaterally      and suggest no evidence of significant arterial occlusive disease.  2. Patent left femoropopliteal bypass graft with no evidence of vein      graft stenosis.   ___________________________________________  Quita Skye Hart Rochester, M.D.   MC/MEDQ  D:  02/16/2008  T:  02/16/2008  Job:  [id number]

## 2010-05-22 NOTE — Assessment & Plan Note (Signed)
OFFICE VISIT   [name], [name]  DOB:  04-06-1950                                       02/16/2008  CHART#:[id number]   The patient is status post left femoral-popliteal bypass graft with a 6  mm Gore-Tex Propaten graft in October of 2007 for severe claudication.  He has had complete relief of his claudication symptoms.  One week ago  he began having pain behind the left knee which occasionally extends up  into the thigh.  It usually occurs with rest.  This sometimes lasts up  to an hour and has occurred on multiple occasions.  He has had no distal  swelling in his calf or ankle and has no history of DVT or  thrombophlebitis.  He has not ambulated much lately because of the  weather.  He has no symptoms in the contralateral right leg.  He has no  history of orthopedic problems in the left leg.  He is taking one  aspirin a day.  He denies any chest pain, dyspnea on exertion, PND or  orthopnea and also has no neurologic symptoms.   PHYSICAL EXAMINATION:  Vital signs:  Blood pressure 141/90, heart rate  is 80, respirations 20.  Neck:  His carotid pulse is 3+, no audible  bruits.  Chest:  Clear to auscultation.  Abdomen:  Soft and nontender  with no masses.  He has 3+ femoral and popliteal pulses bilaterally with  no distal pulses.  Both feet are well-perfused.  There is no distal  edema or evidence of any fluctuance or mass.   Duplex scan revealed the fem-pop graft on the left to be widely patent  with an ABI greater than 1.0 in both legs.  He was reassured regarding  these findings.  If he continues to have pain he will obtain an  orthopedic consultation.   Quita Skye Hart Rochester, M.D.  Electronically Signed   JDL/MEDQ  D:  02/16/2008  T:  02/17/2008  Job:  [id number]

## 2010-05-22 NOTE — Assessment & Plan Note (Signed)
OFFICE VISIT   [name], [name]  DOB:  1950-11-30                                       11/11/2006  CHART#:[id number]   The patient underwent left femoral popliteal bypass grafting with a 6 mm  PROPATEN graft 1 year ago to the above knee popliteal artery.  He had 1  vessel runoff to a peroneal artery, but has had an excellent result with  resolution of his claudication symptoms.  He most recently had ABI in  both legs when checked in March.  He was back for a protocol check today  and this graft looks good with no evidence of increased velocity and ABI  greater than 1.0 in both legs with biphasic flow.  He was concerned  about some discomfort the has in his lower thigh when he rides his  exercise bike.  It sometimes extends into the calf, but is not true  claudication-type symptoms.   EXAM:  Blood pressure 140/90, heart rate is 94.  Left leg incisions  remain well-healed.  He has a 3+ femoral and 2 to 3+ popliteal graft  pulse with well-perfused left foot.   He is continuing to do well from his diabetes, and taking his aspirin on  a daily basis, and will return on a regular basis to be called on  protocol.   Quita Skye Hart Rochester, M.D.  Electronically Signed   JDL/MEDQ  D:  11/11/2006  T:  11/12/2006  Job:  [id number]

## 2010-05-22 NOTE — Procedures (Signed)
BYPASS GRAFT EVALUATION   INDICATION:  History of left femoral-popliteal bypass graft with pain.   HISTORY:  Diabetes:  Yes  Cardiac:  Yes  Hypertension:  Yes  Smoking:  Quit  Previous Surgery:  Left femoral-popliteal.   SINGLE LEVEL ARTERIAL EXAM                               RIGHT              LEFT  Brachial:                    149                152  Anterior tibial:             151                149  Posterior tibial:            130                124  Peroneal:  Ankle/brachial index:        0.99               0.98   PREVIOUS ABI:  Date: 02/16/2008  RIGHT:  Greater than 1  LEFT:  Greater  than 1   LOWER EXTREMITY BYPASS GRAFT DUPLEX EXAM:   DUPLEX:  Patent left femoral-popliteal bypass graft with no evidence of  focal stenosis.   IMPRESSION:  1. Patent left femoral-popliteal bypass graft with no evidence of      focal stenosis.  2. Normal ankle-brachial indices with biphasic Doppler waveform noted      in bilateral legs.   ___________________________________________  [name], M.D.   MG/MEDQ  D:  10/24/2008  T:  10/25/2008  Job:  [id number]

## 2010-05-22 NOTE — Assessment & Plan Note (Signed)
[facility]                          [facility] OFFICE NOTE   [name], [name]                        MRN:          [id number]  DATE:08/05/2008                            DOB:          [birth year]    HISTORY OF PRESENT ILLNESS:  The patient is a 60 year old male seen by  Dr. [name], on June 08, 2008, with atypical chest pain, but with an  abnormal Myoview scan.  There was some suggestion of inferior wall  ischemia.  In the past, the patient had a false positive Cardiolite  study.  The patient underwent catheterization by Dr. Eden Emms, who found  no significant CAD.  Per his report, his ejection fraction was 50-55%.  He also noted that the patient had significant bilateral iliac artery  stenosis, but judging from the note, it did not appear that he knew that  the patient had left fem-pop bypass grafting.  The patient does report  some claudication on the left side with pain in the calf, but only after  significant distance of walking.  In the right side, he actually has no  significant complaints of claudication.  I did Dopplers in the office  today and he actually got a palpable pulse in the posterior tibial  artery on the right side and very good signal by Doppler.  I could not  palpate the pulse on the left side, but he also had a reasonable good  signal in the posterior tibial vessel.  The patient currently denies any  substernal chest pain.  He has no orthopnea, PND, palpitations, or  syncope.  He stated that he is very worried about bilateral leg  amputations given his family history of severe peripheral vascular  disease.   MEDICATIONS:  1. Celebrex 200 mg a day.  2. Hydrochlorothiazide 25 mg p.o. daily.  3. Lipitor 10 mg p.o. daily.  4. Aspirin 81 mg p.o. daily.  5. Lansoprazole 30 mg p.o. daily.  6. Glipizide 5 mg p.o. b.i.d.   PHYSICAL EXAMINATION:  VITAL SIGNS:  Blood pressure 150/91, heart rate  is 82, weight is 187 pounds.  GENERAL:  Well-nourished African American male in no apparent distress.  HEENT:  Pupils are equal.  Conjunctivae clear.  NECK:  Supple.  Normal carotid upstroke bilaterally with no bruits.  No  JVD.  LUNGS:  Diminished breath sounds without crackles or wheeze.  HEART:  Regular rate and rhythm.  No significant murmurs.  No rubs.  ABDOMEN:  Soft, nontender.  EXTREMITIES:  No pedal edema and pulses as described above.  NEURO:  The patient is alert and oriented.  Grossly nonfocal.   IMPRESSION:  1. Nonobstructive coronary artery disease.      a.     Catheterization July 20, 2008.      b.     False positive Cardiolite stress study with lateral       ischemia.  2. Peripheral arterial disease.      a.     Status post left femoral-popliteal bypass graft October       2000, followed by Dr. Hart Rochester, but not recently  anymore.      b.     Questionable severe bilateral iliac artery stenosis per       distal arteriogram by catheterization.      c.     Claudication left leg.  3. Status post right pulmonary nodules followed by her Vyas.  4. Type 2 diabetes mellitus.  5. Hypertension.  6. Dyslipidemia.  7. Rheumatoid arthritis.   PLAN:  1. Somewhat surprising that the patient has severe bilateral iliac      artery stenosis given the fact that his claudication symptoms have      not worsened.  On the right leg, he reports no symptoms.  On the      left leg, he does have some claudication, but it takes quite a bit      of activity before noticing any symptoms.  I did call Dr. Excell Seltzer      and notified him about the patient's problem.  I want him to be      seen by Dr. Excell Seltzer, given the fact that we did a catheterization,      and if he does have iliac stenosis, that he thinks might be      symptomatic, than it can be treated percutaneously.  I did refer      the patient for ABIs with segmental pressures to make sure that      this indeed is a flow limiting problem.  Of note is that the      patient  had ABIs done a year ago, which were within normal limits.  2. From cardiac standpoint, the patient is stable and we will continue      aggressive risk factor modification.     [name], MD,FACC  Electronically Signed    GED/MedQ  DD: 08/05/2008  DT: 08/06/2008  Job #: [id number]   cc:   Veverly Fells. Excell Seltzer, MD  Doreen Beam, MD

## 2010-05-25 NOTE — Consult Note (Signed)
NAMEJUNIOR, HUEZO NO.:  [id number]   MEDICAL RECORD NO.:  [id number]          PATIENT TYPE:  EMS   LOCATION:  [facility]JO                         FACILITY:  [facility]   PHYSICIAN:  Nicolasa Ducking, ANP DATE OF BIRTH:  [birth year]   DATE OF CONSULTATION:  02/19/2006  DATE OF DISCHARGE:                                 CONSULTATION   DICTATED FOR:  Dr. Arvilla Meres.   PRIMARY CARDIOLOGIST:  Dr. Jacinto Halim.   PRIMARY CARE PHYSICIAN:  Dr. Reymundo Poll in [place].   PATIENT PROFILE:  A 60 year old African American male with prior history  of atypical chest pain and nonobstructive coronary artery disease, who  presents to the ED with continued atypical chest pain.   PROBLEM LIST:  1. Atypical chest pain/nonobstructive coronary artery disease.      a.     Elevated troponin in November 2007.      b.     December 05, 2005:  Cardiac catheterization.  Left main       normal.  LAD 30%.  Mid left circumflex normal.  RCA:  Minor       irregularities.  2. A 3-mm right lower lobe nodule noted on CT in November 2007, with      recommended followup in February 2008.  3. PVD/claudication, status post left fem-pop diagnosed November 2007      by Dr. Hart Rochester.  4. GERD.  5. Rheumatoid arthritis.  6. Hypertension.  7. Hyperlipidemia.  8. Borderline diabetes.  9. History of left elbow fracture April 2007.  10.Chronic neck pain/degenerative disk disease, pending surgery.   HISTORY OF PRESENT ILLNESS:  A 60 year old African American male with a  history of  chest pain and nonobstructive CAD.  He is status post  catheterization November 2007, which revealed nonobstructive disease,  and also had a chest CT at that time which was negative for PE, but did  show a 3-mm right lower lobe nodule.  He was recommended 64-month  followup.  Since then he has continued to have intermittent sharp,  shooting, focal left-sided chest pain without associated symptoms  occurring several times per day that last  for just a few seconds and  resolving spontaneously.  These pains do not limit his activities.  He  presented today for cervical disk surgery, and reported chest pain to  the anesthesia staff, and was sent down to the ED for evaluation.  We  were asked to eval.  He is currently pain-free.  EKG was without acute  changes.   ALLERGIES:  NO KNOWN DRUG ALLERGIES.   HOME MEDICATIONS:  1. Lipitor, Prevacid, Celebrex, hydrochlorothiazide, and aspirin.   FAMILY HISTORY:  Mother died of an MI in her 48s.  She also had PVD.  Father died at age 21 with a history of CVA and hypertension.  He has 6  brothers and sisters.  There is peripheral vascular disease, MI, and  diabetes in his siblings.   SOCIAL HISTORY:  He lives in [place] with his wife.  He is retired and on  disability.  He has an 80 pack-year history of tobacco abuse, quitting  in October  2007.  Occasionally will drink a beer.  Denies any drug use.  Does not exercise.   REVIEW OF SYSTEMS:  Positive for sharp, shooting, focal chest pains, as  outlined in the HPI.  Positive for neck pain.  History of borderline  diabetes.  Otherwise, all other systems are reviewed as negative.   PHYSICAL EXAMINATION:  Temperature is 97.6.  Heart rate 93.  Respirations 18.  Blood pressure 110/88.  Pulse ox 96% on room air.  A  pleasant, African American male in no acute distress.  Awake and alert  and oriented x3.  Neck:  No bruits or JVD.  Lungs:  Respirations regular  and unlabored, clear to auscultation.  Cardiac:  Regular S1, S2.  No S3,  S4 or murmurs.  Abdomen is round, soft and nontender, nondistended.  Bowel sounds present x4.  Extremities.  Warm, dry.  No clubbing,  cyanosis or edema.  Dorsalis pedis, posterior tibial pulses 1+ and equal  bilaterally.   CLINICAL FINDINGS:  Chest x-ray is pending.  ECG shows sinus rhythm with  a normal axis, a rate of 85, and minimal ST segment depression in V4-V6.  Was unchanged from previous ECG.  Lab work  is pending.   ASSESSMENT AND PLAN:  1. Atypical chest pain similar to previous pain that he described in      November of 2007.  Catheterization at that time showed      nonobstructive coronary artery disease.  He also had a CT of his      chest at that time that was negative for PE, but did show a 3-mm      right lower lobe nodule with recommended 60-month followup.  As it      has been 3 months, we will go ahead and write for that repeat CT      today.  If CT is negative/stable, he would be okay for discharge      from the ED, from our standpoint.  We do not feel that his chest      pain is cardiac in origin, given our previous findings.  2. Hypertension, stable.  Continue home medications, which include      hydrochlorothiazide.  3. Hyperlipidemia.  This is followed by his primary care physician.      The patient is on Lipitor.  4. Borderline diabetes, followed by primary care.  5. Degenerative disk disease/neck pain.  Patient was pending surgery      today.  From a cardiac standpoint he is felt to be low risk for      surgery, and does not require any additional testing at this time.  6. GERD.  He is on a PPI at home.  7. PVD/claudication.  He remains on aspirin and statin therapy.  8. Remote tobacco abuse.  Patient quit in October of 2007, and is      congratulated as well as encouraged to remain quit.      Nicolasa Ducking, ANP     CB/MEDQ  D:  02/19/2006  T:  02/20/2006  Job:  [id number]

## 2010-05-25 NOTE — Discharge Summary (Signed)
[name], [name]               ACCOUNT NO.:  [id number]   MEDICAL RECORD NO.:  [id number]          PATIENT TYPE:  INP   LOCATION:  2028                         FACILITY:  [facility]   PHYSICIAN:  Arturo Morton. Riley Kill, MD, FACCDATE OF BIRTH:  29-Jan-1950   DATE OF ADMISSION:  12/04/2005  DATE OF DISCHARGE:  12/06/2005                               DISCHARGE SUMMARY   PRIMARY CARDIOLOGIST:  [name], MD, [facility].   PRIMARY CARE PHYSICIAN:  Dr. Doreen Beam.   PRINCIPAL DIAGNOSIS:  Chest pain.   SECONDARY DIAGNOSES:  1. Peripheral vascular disease status post left-sided femoral      popliteal bypass November 26, 2005 by Dr. Hart Rochester.  2. Gastroesophageal reflux disease.  3. Rheumatoid arthritis.  4. Hypertension times three years.  5. Hyperlipidemia.  6. Borderline diabetes mellitus.  7. History of left elbow fracture in April 2007.  8. A 3-mm right lower lobe nodule noted on chest CT on this admission.   ALLERGIES:  No known drug allergies.   PROCEDURE:  Left heart catheterization and chest CT.   HISTORY OF PRESENT ILLNESS:  A 60 year old African-American male with a  prior history as stated above who was in his usual state of health until  the morning of December 04, 2005 when he was out fishing and smoking a  cigar and developed 8 out of 10 substernal chest pressure radiating into  the epigastric area.  He had some associated dizziness, and symptoms  resolved spontaneously.  Unfortunately, he continued to have  intermittent episodes of this discomfort thus prompting him to present  to the [facility] ED in [place] where an ECG was without acute changes.  However, his troponin is mildly elevated at 0.07, 0.08, and 0.07.  CK-MB  was negative.   After being seen by cardiology, decision was made to transfer him to  [facility] for further evaluation of cardiac catheterization.   HOSPITAL COURSE:  He underwent left heart cardiac catheterization on  November 29 revealing nonobstructive  coronary artery disease.  Left  ventriculography was not performed secondary to ventricular ectopy.  Because there was no identifiable cause for his chest pain on  catheterization, a D-dimer was drawn and was fairly elevated at 2.25.  He subsequently underwent a CT of the chest to rule out P.E., and this  showed no evidence of pulmonary  thromboembolism; however, a 3-mm right  lower lobe nodule was noted, and radiology recommended three to six  months' followup CT.  He has not had any recurrent chest pain.  He is  being discharged home this morning in satisfactory condition.   DISCHARGE LABORATORY DATA:  Hemoglobin 13.1, hematocrit 39.0, WBC 6.0,  platelets 271.  PT 13.5, INR 1.0, PTT 82, D-dimer 2.25.  Sodium 133,  potassium 4.3, chloride 99, CO2 28, BUN 15, creatinine 1.1, glucose 118,  total bilirubin 0.6, alkaline phosphatase 76, AST 26, ALT 30, total  protein 7.0, albumin 4.0, calcium 9.1, hemoglobin A1c 7.26, CK 615, MB  3.2, troponin-I 0.08, total cholesterol 258, triglycerides 108, HDL 41,  LDL 195.  TSH 1.363.   DISPOSITION:  The patient  will be discharged home today in good  condition.   FOLLOWUP:  He is asked to follow up with his primary care physician, Dr.  Doreen Beam in [place] in one to two weeks.  We will require a repeat chest  CT in three to six months to reevaluate the 3-mm right lower lobe nodule  noted on CT during this admission.   DISCHARGE MEDICATIONS:  1. Lipitor 10 mg daily.  2. Aspirin 81 mg daily.  3. HCTZ 25 mg daily.  4. Prevacid as previously prescribed.  5. Celebrex as previously prescribed.   He has been counseled on the importance of all tobacco/smoking  cessation.   OUTSTANDING LABORATORY STUDIES:  None.   DURATION OF DISCHARGE ENCOUNTER:  Fifty minutes including physician  time.      Nicolasa Ducking, ANP      Arturo Morton. Riley Kill, MD, [facility]  Electronically Signed    CB/MEDQ  D:  12/06/2005  T:  12/07/2005  Job:  [id number]   cc:   Doreen Beam

## 2010-05-25 NOTE — H&P (Signed)
NAMESADIEL, MOTA               ACCOUNT NO.:  [id number]   MEDICAL RECORD NO.:  [id number]          PATIENT TYPE:  INP   LOCATION:  NA                           FACILITY:  [facility]   PHYSICIAN:  Constance Holster, PA    DATE OF BIRTH:  10/16/1950   DATE OF ADMISSION:  11/06/2005  DATE OF DISCHARGE:                                HISTORY & PHYSICAL   CHIEF COMPLAINT:  Pain in the left foot.   HISTORY OF PRESENT ILLNESS:  This is a 60 year old African American male,  who complains of worsening claudication symptoms in the left leg that  started the first of the year.  The patient's symptoms have recently  worsened and he is unable to walk 1 block.  The patient complains of pain  with ambulation and rarely at rest.  He complains of pain in his left foot.  The patient begins on his toe of his left foot and then his foot becomes  numb.  The patient says that the pain sometimes radiates into the left calf  and up the left thigh.  The patient denies any peripheral edema, history of  ulcerations, infection.  The patient does have a slight decrease in motor  function due to the fact that he can only walk about 1 block.  The patient  denies any fever, chills, tenderness, chest pain, shortness of breath,  dyspnea on exertion, TIA symptoms, syncope, dizziness.   The patient underwent ABIs on October 24, 2005 at [facility], which shows ABI  is 1.06 on the right and 0.69 on the left.  The patient underwent an  aortogram on October 31, 2005 by Dr. Hart Rochester, which showed left SF occlusion  with AK popliteal and  __________  runoff (peroneal).  Patent SFA and 3-  vessel runoff (occluded PT).  See dictated procedure note for further  details.   Due to the patient's severe claudication symptoms, which are worsening, it  was best thought that he undergo a left fem-pop bypass graft on November 06, 2005 by Dr. Hart Rochester.  The risks and benefits were explained to the patient in  great detail and he has agreed  to proceed.   PAST MEDICAL HISTORY:  1. Hypertension.  2. Arthritis.   PAST SURGICAL HISTORY:  1. Repair of left arm fracture, elbow.  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  3. Testicular surgery.   ALLERGIES:  NO KNOWN DRUG ALLERGIES.   CURRENT MEDICATIONS:  1. Celebrex 200 mg p.o. daily.  2. Hydrochlorothiazide 25 mg p.o. daily.   REVIEW OF SYSTEMS:  See HPI for pertinent positives.  Negative for diabetes  mellitus type 2, pulmonary artery disease, COPD, CVA, hyperlipidemia.   SOCIAL HISTORY:  The patient is single and has 3 children.  He lives alone.  The patient does still drive.  He quit smoking tobacco 1 year ago; however,  he has a history of smoking 1 pack a day for over 35 years.  The patient  occasionally drinks beer.   FAMILY HISTORY:  The patient has a positive family history of diabetes  mellitus in his mother and  cerebrovascular accident in his father.  The  patient does have a family history of lower extremity amputations.  He  denies any coronary artery disease.   PHYSICAL EXAMINATION:  VITAL SIGNS:  Blood pressure 128/78, heart rate 60,  respiration 16, temperature 97, height 5 feet 10 inches, weight 175 pounds.  GENERAL:  This is a 60 year old African American male in no acute distress.  HEENT:  Normocephalic, atraumatic.  Pupils are equal, round, and reactive to  light and accommodation.  Extraocular movements are intact.  Oral mucosa is  pink and moist.  Sclerae are nonicteric.  NECK:  Supple.  Palpable full carotids.  No carotid bruits heard to  auscultation.  LUNGS:  Symmetrical on inspiration and unlabored.  Clear to auscultation  bilaterally.  CARDIAC:  Regular rate and rhythm.  No murmur, gallop, or rub.  ABDOMEN:  Soft, nontender, and nondistended.  Normoactive bowel sounds.  GU/RECTAL:  Deferred.  EXTREMITIES:  No lower extremity edema.  Right lower extremity warm.  Left  lower extremity cool.  Pulses:  Radial 2+ bilaterally, femoral 2+  bilaterally.   Popliteal 2+ on the right, dorsalis pedis 2+ on the right.  There are no palpable popliteal, dorsalis pedis, or posterior tibial pulses  on the left.  NEUROLOGIC:  Nonfocal.  Alert and oriented x4.  The patient is lying in bed  after his aortogram and __________ .  Gaze is unable to be assessed at this  time.  The patient has muscle strength 5+ bilaterally and throughout.  Deep  tendon reflexes are 2+ and symmetrical.   ASSESSMENT:  Left superficial femoral artery with claudication symptoms.   PLAN:  1. Admit the patient to [facility] on November 06, 2005 by Dr. Hart Rochester.  2. The patient will undergo a left fem-pop bypass graft.  The risks and      benefits are explained to the patient in great detail.  Dr. Hart Rochester has      seen and evaluated the patient prior to admission and agrees with the      above.      Constance Holster, PA     JMW/MEDQ  D:  10/31/2005  T:  11/01/2005  Job:  [id number]

## 2010-05-25 NOTE — Op Note (Signed)
[name], [name]               ACCOUNT NO.:  [id number]   MEDICAL RECORD NO.:  [id number]          PATIENT TYPE:  AMB   LOCATION:  SDS                          FACILITY:  [facility]   PHYSICIAN:  Quita Skye. Hart Rochester, M.D.  DATE OF BIRTH:  January 12, 1950   DATE OF PROCEDURE:  11/01/2005  DATE OF DISCHARGE:  10/31/2005                                 OPERATIVE REPORT   PREOPERATIVE DIAGNOSIS:  Severe claudication, left leg, and occasional rest  pain and numbness, left foot, secondary to superficial femoral and tibial  occlusive disease.   PROCEDURE:  Abdominal aortogram with bilateral lower extremity runoff via  right common femoral approach.   SURGEON:  Quita Skye. Hart Rochester, M.D.   ANESTHESIA:  Local with Xylocaine.   CONTRAST:  130 mL.   COMPLICATIONS:  None.   DESCRIPTION OF PROCEDURE:  The patient was taken to the [facility]  peripheral endovascular lab and placed in the supine position at which time  both groins were prepped with Betadine solution and draped in a routine,  sterile manner.  After infiltration of 1% Xylocaine, the right common  femoral artery was entered percutaneously.  A guidewire was passed into the  suprarenal aorta under fluoroscopic guidance.  A 5-French sheath and dilator  were passed over the guidewire.  Dilator removed and the standard pigtail  catheter positioned in the suprarenal aorta.  Flush abdominal aortogram was  performed, injecting 20 mL contrast at 20 mL per second.  This revealed the  aorta to be widely patent with single renal arteries bilaterally and rapid  filling of the superior mesenteric artery with no stenotic lesions noted.  The aorta had mild atherosclerotic changes but was widely patent as were  both common, internal and external iliac arteries.   Catheter was withdrawn into the terminal aorta and bilateral lower extremity  runoff performed, injecting 88 mL contrast at 8 mL per second.  This  revealed the right iliac, common femoral,  superficial femoral, and popliteal  arteries to be widely patent.  Tibial vessels on the right all filled  proximally, but the posterior tibial artery was occluded in the mid-calf and  distally.  The other two vessels filled to the foot.  On the left side,  there was patent common femoral and profunda femoris artery, left  superficial femoral arteries with good caliber to the mid to distal thigh  where there was a 5-cm segment of totally occluded vessel reconstituting  above the knee with essentially one vessel runoff through the perineal  artery, other two vessels being small.  Better views of the distal left  superficial femoral, popliteal and tibial vessels were obtained using the  peak-hold technique, confirming the above findings.   The catheter was then removed over the guidewire.  Sheath removed.  Adequate  compression applied.  No complications ensued.   FINDINGS:  1. Widely patent aortoiliac system.  2. Single widely patent renal arteries bilaterally.  3. Segmental occlusion, left superficial femoral artery in mid-thigh with      reconstitution above-knee popliteal      artery and one-vessel runoff through peroneal  artery.  4. Widely patent right superficial femoral artery and tibial disease on      the right with occluded posterior tibial artery distally.           ______________________________  Quita Skye Hart Rochester, M.D.     JDL/MEDQ  D:  10/31/2005  T:  11/01/2005  Job:  [id number]

## 2010-05-25 NOTE — Cardiovascular Report (Signed)
NAMEMANA, [name]               ACCOUNT NO.:  [id number]   MEDICAL RECORD NO.:  [id number]          PATIENT TYPE:  INP   LOCATION:  2028                         FACILITY:  [facility]   PHYSICIAN:  Arturo Morton. Riley Kill, MD, FACCDATE OF BIRTH:  1950-04-12   DATE OF PROCEDURE:  12/05/2005  DATE OF DISCHARGE:                            CARDIAC CATHETERIZATION   INDICATIONS:  Mr. Brogden recently underwent peripheral vascular  surgery.  He presents now with some chest pain and elevated troponin.  There were no definite EKG changes.  The current study is done to assess  coronary anatomy.   PROCEDURES:  1. Left heart catheterization.  2. Selective coronary arteriography.  3. Selective left ventriculography.   DESCRIPTION OF PROCEDURE:  The patient was brought to the  catheterization laboratory and prepped and draped in usual fashion.  Through an anterior puncture, the right femoral artery was easily  entered.  A 5-French sheath was placed.  Views of the left and right  coronary arteries were obtained in multiple angiographic projections.  We concentrated on the mid LAD because there was mid systolic  contraction.  Multiple views were obtained.  Views of the right coronary  artery were then obtained and we gave intracoronary nitroglycerin  because of lumpy bumpy irregularity in the PDA.  Central aortic and left  ventricular pressures were measured with pigtail.  Ventriculography was  performed in the RAO projection.  He was taken to the holding area in  satisfactory clinical condition.  ACT was checked and was felt to be  appropriate for sheath removal.   HEMODYNAMIC DATA:  1. Central aortic pressure 136/87, mean 108.  2. Left atrial pressure 91/7, but after intracoronary nitroglycerin      administration.  3. No gradient on pullback across the aortic valve.   ANGIOGRAPHIC DATA:  1. Ventriculography was done in the RAO projection.  There was      ventricular ectopy at the beginning of  the injection so a reliable      ejection fraction could not be calculated but late beats suggested      well-preserved overall LV function.  2. There is moderate coronary calcification noted by plain      fluoroscopy.  3. The left main is free of critical disease.  4. There is moderate calcification of the proximal left anterior      descending artery.  There is mid systolic contraction of the mid      LAD, but there does not appear to be high-grade disease.  This      would be estimated in the range of 30%.  The diagonals and distal      LAD appear to be intact.  5. The circumflex is a large system providing multiple marginal      branches.  There is some calcification in the mid vessel but      appears smooth throughout.  6. The right coronary artery is smooth throughout, but in the mid      distal portion leading into the PDA there is a fair amount of lumpy      bumpy  irregularity.  No critical stenoses are noted.   CONCLUSION:  1. Low normal ejection fraction.  2. Mid systolic closure of the left anterior descending but without      high-grade stenosis.  3. Lumpy bumpy irregularity of the posterior descending artery.   The patient is postoperative.  He has mild troponin elevation.  We will  get a D-dimer.  He may need a CT to rule out pulmonary embolus as a  cause of his symptoms.  He also needs aggressive risk factor reduction  including discontinuation of smoking.      Arturo Morton. Riley Kill, MD, [facility]  Electronically Signed     TDS/MEDQ  D:  12/05/2005  T:  12/05/2005  Job:  [id number]   cc:   [name], MD,FACC  CV Lab

## 2010-05-25 NOTE — Op Note (Signed)
NAMEQUANTA, ROHER               ACCOUNT NO.:  [id number]   MEDICAL RECORD NO.:  [id number]          PATIENT TYPE:  INP   LOCATION:  2550                         FACILITY:  [facility]   PHYSICIAN:  Quita Skye. Hart Rochester, M.D.  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   OPERATION:  Left common femoral to popliteal (above-knee) bypass using a 6-  mm Gore-Tex graft (Propatent) plus intraoperative arteriogram x2.   SURGEON:  Dr. Hart Rochester   FIRST ASSISTANT:  Nurse.   ANESTHESIA:  General endotracheal.   PROCEDURE:  The patient was taken to the operating room, placed in the  supine position at which time satisfactory general endotracheal anesthesia  was administered.  The left leg was prepped with Betadine scrub and solution  and draped in routine sterile manner.  Longitudinal incision was made in the  left inguinal region, carried down through subcutaneous tissue, and the  common superficial and profunda femoris arteries dissected free, encircled  with vessel loops.  All had an excellent pulse.  Saphenous vein was exposed  at the saphenofemoral junction.  It was a barely adequate vein which did not  have any early branches but was not as large as I would have liked and since  we were going to the above-knee popliteal segment, it was decided to  preserve the vein.  Therefore, it was carefully avoided for later use.  Medial incision was made in the distal thigh above-the-knee, also being  careful to avoid injury to the saphenous vein, and the popliteal artery was  encircled with vessel loops after dissecting it free distal to the adductor  canal.  It was a  soft, normal-appearing vessel at this point with no  palpable pulse.  Subfascial anatomic tunnel was created, and a 6-mm Gore-Tex  - Propatent graft was delivered through the tunnel, and the patient was  heparinized.  Popliteal artery was occluded proximally and distally and  above-the-knee with Vesseloops, opened with 15 blade, extended with the  Potts scissors.  It would easily accept a 4.5 mm dilator.  Graft was  slightly spatulated and anastomosed end-to-side with 6-0 Prolene.  Following  this, femoral vessels were occluded in the inguinal region, longitudinal  opening made in the common femoral artery with 15 blade extended with the  Potts scissors.  The graft was fashioned appropriately and spatulated and  anastomosed end-to-side with 6-0 Prolene.  Clamps then released, and there  was a good pulse in the graft with loud but slightly monophasic flow at the  popliteal level and audible flow at the anterior tibial level in the ankle.  Intraoperative arteriogram was performed with a look at the distal  anastomosis, and a second was done to look at the runoff which was a one-  vessel runoff through the peroneal artery,  other vessels being occluded, and  this collateralized at the anterior tibial at the ankle.  Protamine was given to reverse the heparin.  Following adequate hemostasis.  The wounds were irrigated with saline, closed in layers with Vicryl in a  subcuticular fashion with Steri-Strips.  Sterile dressing applied.  The  patient taken to recovery room in satisfactory condition.           ______________________________  Quita Skye Hart Rochester, M.D.     JDL/MEDQ  D:  11/06/2005  T:  11/06/2005  Job:  [id number]

## 2010-05-25 NOTE — Assessment & Plan Note (Signed)
[facility]                          [facility] OFFICE NOTE   Brett, [name]                        MRN:          [id number]  DATE:02/28/2006                            DOB:          03-31-1950    REFERRING PHYSICIAN:  [name]   HISTORY OF PRESENT ILLNESS:  The patient is a pleasant 60 year old  African American male with a history of non obstructive coronary artery  disease.  The patient is status post catheterization performed in  November of 2007.  He had a rather significant coronary calcification,  but had no significant obstructive disease.  The LAD at most had a 30%  stenosis.  The patient has been bothered for some time with atypical  chest pain.  He also had a lawn mower accident last year, which left him  with significant injuries.  He required elbow surgery.  In addition,  also he has chronic neck pain for which he is consulting Dr. Jeral Fruit.  Recommendations have currently been given to proceed with cervical spine  surgery.  As a matter of fact the patient the patient was scheduled for  surgery by Dr. Jeral Fruit but the day of his surgery he complained of  substernal  chest pain and he was sent to the emergency room.  A CT scan  was obtained which showed no evidence of pulmonary embolism.  The  patient had negative cardiac enzymes markers and EKG was also negative  for myocardial infarction.  The patient was then given a recommendation  to follow up with his primary care physician and his cardiologist for  further clearance.  The patient now presents for an office visit to  assess his atypical chest pain.  Of note the patient reports left sided  pain, which is rather sharp in nature, but has no exertional component.  Palpitation of the chest actually does cause some tenderness.  The  patient also has chronic arthritis for which he takes Celebrex.  He  states that the pain is radiating from the back into the left shoulder  and also  radiating around to the left chest wall.  The patient denies  any orthopnea, paroxysmal nocturnal dyspnea, palpitations or syncope.  The patient is compliant with his medical regiment and actually stopped  smoking several months ago.  His EKG in the office today shows normal  sinus rhythm with no acute ischemic changes.   ALLERGIES:  No known drug allergies.   PAST MEDICAL HISTORY:  Problems as below.   FAMILY HISTORY:  Mother had a heart attack at age 48 and is deceased.  Father had a stroke and is deceased at age 79.  He has a sister who is  alive at 62 with high blood pressure.  He also has a brother that is  alive at 29 with high blood pressure.   SOCIAL HISTORY:  The patient lives in [place].  He does not smoke and he  drinks an occasional beer.  He quit smoking approximately a year ago.   MEDICATIONS:  1. Celebrex 200 mg p.o. daily.  2. Hydrochlorothiazide 25 mg p.o. daily.  3. Prevacid 30 mg p.o. daily.  4. Lipitor 10 mg p.o. daily.  5. aspirin 81 mg p.o. daily.   REVIEW OF SYSTEMS:  No nausea or vomiting, no fever or chills.  No  orthopnea or paroxysmal nocturnal dyspnea. No palpitations or syncope.  No melena or hematochezia.  No dysuria or frequency.  The remainder of  the review of system is otherwise within normal limits.   PHYSICAL EXAMINATION:  VITAL SIGNS:  Blood pressure is 135/92, heart  rate 102 beats per minute, weight is 192 pounds.  NECK:  Normal carotid upstroke and no carotid bruits.  LUNGS:  Clear, breath sounds bilaterally.  HEART:  Regular rate and rhythm with normal S1, S2, no murmurs, rubs or  gallops.  ABDOMEN:  Soft, nontender, no rebound or guarding, good bowel sounds.  EXTREMITY:  No cyanosis, clubbing or edema.  NEURO:  The patient alert, oriented and grossly non focal.   PROBLEM:  1. Coronary artery disease.      a.     Coronary calcification of the left anterior descending.      b.     Nonobstructive coronary artery disease by catheterization  in       November of 2007.      c.     Low normal ejection fraction, but no heart failure symptoms.      d.     Atypical chest pain (likely noncardiac).  2. Peripheral vascular disease.      a.     Status post left fem-pop secondary to claudication.  3. Chronic obstructive pulmonary disease.  4. Rheumatoid arthritis.  5. Border line diabetes mellitus.  6. History of 3 mm nodule on CT, but not documented on most recent CT      scan.  7. Gastroesophageal reflux disease.  8. Cervical spine injury.   PLAN:  1. From a cardiovascular perspective, the patient appears to be of      moderate risk for perioperative myocardial infarction.  The      patients chest pain is atypical, but warrant further screening with      an exercise Cardiolite study, which can be performed early next      week.  2. If the patient's Cardiolite study is within normal limits, then the      patient appears to be at low risk for any cardiovascular      complications and if needed can proceed with neurosurgical      procedure.  3. The patients chest pain is very atypical and appears noncardiac.  I      did start the patient on beta blocker therapy, which can be      continued perioperatively, but more importantly I am using this      medication for better blood pressure control due to elevated      diastolic blood      pressure.  4. The patient can follow up with [place] in 6 months.     [name], MD,FACC  Electronically Signed    GED/MedQ  DD: 02/28/2006  DT: 02/28/2006  Job #: [id number]   cc:   Leafy Half, M.D.

## 2010-05-25 NOTE — Discharge Summary (Signed)
[name], [name] NO.:  [id number]   MEDICAL RECORD NO.:  [id number]          PATIENT TYPE:  INP   LOCATION:  2028                         FACILITY:  [facility]   PHYSICIAN:  Rowe Clack, P.A.-C. DATE OF BIRTH:  04/27/1950   DATE OF ADMISSION:  11/06/2005  DATE OF DISCHARGE:  11/09/2005                               DISCHARGE SUMMARY   HISTORY OF PRESENT ILLNESS:  The patient is a 60 year old African  American male who complained of worsening claudication symptoms in the  left leg that started at the beginning of this year.  The patient's  symptoms had worsened over time, and he was unable to walk more than one  block without pain.  The patient began to complain of pain with  ambulation and rarely at rest.  He complained of pain in his left foot.  The pain began at the top of his toes of the left foot and his foot  would become numb.  Sometimes the pain would radiate into the left calf  and up the left thigh.  The patient denied any peripheral edema, history  of ulcerations or infection.  The patient also noted some slight  decrease in motor function as well.  The patient denied fevers, chills,  tenderness, chest pain, shortness of breath, dyspnea on exertion, TIA  symptoms, syncope or dizziness.  He underwent an EBI study on October 24, 2005, at [facility], which showed his ABI to be 1.06 on the  right and 0.69 on the left.  He subsequently underwent arteriography on  October 31, 2005, by Dr. Hart Rochester, on which he was found to have occlusion  of the left superficial femoral artery and was deemed to be a candidate  for surgical revascularization.  He was admitted this hospitalization  for the procedure.   PAST MEDICAL HISTORY:  1. Hypertension.  2. Arthritis.  3. Peripheral vascular disease.   PAST SURGICAL HISTORY:  1. Repair of left arm fracture, at elbow.  2. Right fifth finger amputation.  3. Testicular surgery.   ALLERGIES:  NO KNOWN DRUG  ALLERGIES.   MEDICATIONS PRIOR TO ADMISSION:  1. Celebrex 200 mg daily.  2. Hydrochlorothiazide 25 mg daily.   FAMILY HISTORY:  Please see the history and physical notes on admission.   SOCIAL HISTORY:  Please see the history and physical notes on admission.   REVIEW OF SYSTEMS:  Please see the history and physical notes on  admission.   PHYSICAL EXAMINATION:  Please see the history and physical notes on  admission.   HOSPITAL COURSE:  The patient was admitted and taken to the operating  room on November 06, 2005, at which time he underwent the following  procedure, left femoropopliteal above-knee bypass with 6-mm Propaten  Gore-Tex and the intraoperative arteriogram x2.  The patient tolerated  the procedure well and was taken to the postanesthesia care unit in  stable condition.   POSTOPERATIVE HOSPITAL COURSE:  The patient has done well.  He has  maintained stable hemodynamics.  His ABI postoperatively on the left was  1 with positive Doppler flow in  the posterior tibial.  His incisions did  well without evidence of infection or bleeding.  His progression was  somewhat slow but he did make steady progress over time.  He remained  hemodynamically stable and afebrile and on November 09, 2005, he was  deemed to be acceptable for discharge.   CONDITION ON DISCHARGE:  Stable and improving.   MEDICATIONS ON DISCHARGE:  As preoperatively, additionally for pain,  Tylox 1 or 2 every 6 hours as needed.  Patient received written  instructions regarding medications, activity, diet, wound care and  followup.   FOLLOWUP:  Followup will be with Dr. Hart Rochester on Tuesday, November 20th,  at 2:30 p.m. with ankle-brachial index study.   FINAL DIAGNOSIS:  Severe peripheral vascular disease as described, now  status post surgical revascularization of the left leg with a left  femoral to above-knee popliteal bypass with Propaten graft.  Other  diagnoses as previously listed per the  history.      Rowe Clack, P.A.-C.     [name]  D:  12/25/2005  T:  12/25/2005  Job:  [id number]   cc:   Quita Skye. Hart Rochester, M.D.

## 2010-05-29 ENCOUNTER — Ambulatory Visit (INDEPENDENT_AMBULATORY_CARE_PROVIDER_SITE_OTHER): Payer: Medicare Other | Admitting: Vascular Surgery

## 2010-05-29 ENCOUNTER — Encounter (INDEPENDENT_AMBULATORY_CARE_PROVIDER_SITE_OTHER): Payer: Medicare Other

## 2010-05-29 DIAGNOSIS — I739 Peripheral vascular disease, unspecified: Secondary | ICD-10-CM

## 2010-05-29 DIAGNOSIS — Z48812 Encounter for surgical aftercare following surgery on the circulatory system: Secondary | ICD-10-CM

## 2010-05-29 DIAGNOSIS — I70219 Atherosclerosis of native arteries of extremities with intermittent claudication, unspecified extremity: Secondary | ICD-10-CM

## 2010-05-30 NOTE — Assessment & Plan Note (Signed)
OFFICE VISIT  [name], [name] DOB:  [birth year]                                       05/29/2010 CHART#:[id number]  The patient returns today for continued followup regarding his left femoral-popliteal bypass graft performed in 2007, which is an above knee fem-pop Gore-Tex graft with Propaten with 1 vessel runoff through the peroneal artery.  ABI was greater than 1 in February of this year, and in March of this year he was having some pain behind his knee but not claudication symptoms, and the graft was patent.  Sometime in the last 2 months he had occluded his bypass graft; it sounds like by history it has been at least a month ago.  He is having occasional numbness in the left foot, severe claudication symptoms and wants this reevaluated.  I do not think we will be able to thrombolyse this graft since it has been occluded for awhile.  The saphenous vein was very borderline in size at the time of surgery.  I discussed with him the option of waiting to see if the left leg symptoms improved versus repeat angiogram now, and he would like to proceed.  PHYSICAL EXAMINATION:  Vital Signs:  Today, his blood pressure is 134/88, heart rate 100, respirations 20.  Extremities:  He has 3+ left femoral pulse and no popliteal or distal pulse palpable.  Left foot is slightly cool but adequately perfused with no infection or ulceration. Right foot is unremarkable.  We will schedule him for an angiogram to be done by Dr. Myra Gianotti on Tuesday, the 29th of May to see what options are available.  He may be a candidate for a redo femoral-popliteal graft below the knee using either saphenous vein or Gore-Tex, depending on the findings.    Quita Skye Hart Rochester, M.D. Electronically Signed  JDL/MEDQ  D:  05/29/2010  T:  05/30/2010  Job:  [id number]

## 2010-06-05 ENCOUNTER — Ambulatory Visit (HOSPITAL_COMMUNITY)
Admission: RE | Admit: 2010-06-05 | Discharge: 2010-06-05 | Disposition: A | Discharge status: Home / Self Care | Payer: Medicare Other | Source: Ambulatory Visit | Attending: Surgery | Admitting: Surgery

## 2010-06-05 DIAGNOSIS — I25810 Atherosclerosis of coronary artery bypass graft(s) without angina pectoris: Secondary | ICD-10-CM | POA: Insufficient documentation

## 2010-06-05 DIAGNOSIS — T82898A Other specified complication of vascular prosthetic devices, implants and grafts, initial encounter: Secondary | ICD-10-CM

## 2010-06-05 DIAGNOSIS — I70219 Atherosclerosis of native arteries of extremities with intermittent claudication, unspecified extremity: Secondary | ICD-10-CM

## 2010-06-05 LAB — POCT I-STAT, CHEM 8
BUN: 13 mg/dL (ref 6–23)
Calcium, Ion: 0.9 mmol/L — ABNORMAL LOW (ref 1.12–1.32)
Chloride: 111 mEq/L (ref 96–112)
Creatinine, Ser: 0.9 mg/dL (ref 0.4–1.5)
Glucose, Bld: 159 mg/dL — ABNORMAL HIGH (ref 70–99)
HCT: 45 % (ref 39.0–52.0)
Hemoglobin: 15.3 g/dL (ref 13.0–17.0)
Potassium: 4.2 mEq/L (ref 3.5–5.1)
Sodium: 139 mEq/L (ref 135–145)
TCO2: 24 mmol/L (ref 0–100)

## 2010-06-05 LAB — GLUCOSE, CAPILLARY: Glucose-Capillary: 165 mg/dL — ABNORMAL HIGH (ref 70–99)

## 2010-06-06 LAB — GLUCOSE, CAPILLARY: Glucose-Capillary: 114 mg/dL — ABNORMAL HIGH (ref 70–99)

## 2010-06-06 NOTE — Procedures (Unsigned)
BYPASS GRAFT EVALUATION  INDICATION:  Follow up left fem-pop bypass graft.  HISTORY: Diabetes:  Yes. Cardiac:  No. Hypertension:  Yes. Smoking:  Current. Previous Surgery:  Left femoral-to-popliteal bypass graft, 11/06/2005.  SINGLE LEVEL ARTERIAL EXAM                              RIGHT              LEFT Brachial:                    137                135 Anterior tibial:             140                74 Posterior tibial:            65                 86 Peroneal: Ankle/brachial index:        1.02               0.63  PREVIOUS ABI:  Date: 02/08/2010  RIGHT:  0.94  LEFT:  1.06  LOWER EXTREMITY BYPASS GRAFT DUPLEX EXAM:  DUPLEX:  No flow visualized within the left femoral to popliteal bypass graft.  IMPRESSION: 1. Occluded left femoral to popliteal bypass graft. 2. A left ankle brachial index has decreased significantly since the     previous examination.  ___________________________________________ Quita Skye Hart Rochester, M.D.  EM/MEDQ  D:  05/30/2010  T:  05/30/2010  Job:  [id number]

## 2010-06-12 ENCOUNTER — Ambulatory Visit (INDEPENDENT_AMBULATORY_CARE_PROVIDER_SITE_OTHER): Payer: Medicare Other | Admitting: Vascular Surgery

## 2010-06-12 DIAGNOSIS — I70219 Atherosclerosis of native arteries of extremities with intermittent claudication, unspecified extremity: Secondary | ICD-10-CM

## 2010-06-13 NOTE — Op Note (Signed)
[name], [name]               ACCOUNT NO.:  [id number]  MEDICAL RECORD NO.:  [id number]           PATIENT TYPE:  O  LOCATION:  SDSC                         FACILITY:  [facility]  PHYSICIAN:  Juleen China IV, MDDATE OF BIRTH:  11/20/1950  DATE OF PROCEDURE:  06/05/2010 DATE OF DISCHARGE:  06/05/2010                              OPERATIVE REPORT   SURGEON: 1. Durene Cal IV, MD  PREOPERATIVE DIAGNOSIS:  Left leg claudication.  POSTOPERATIVE DIAGNOSIS:  Left leg claudication.  PROCEDURES PERFORMED: 1. Ultrasound access right femoral artery. 2. Abdominal aortogram. 3. Bilateral runoff. 4. Second-order catheterization.  INDICATIONS:  This is a 60 year old gentleman status post left femoral to above-knee popliteal artery bypass graft with Gore-Tex use.  By ultrasound, has occluded bypass.  He is now asymptomatic.  He comes in for arteriogram.  PROCEDURE:  The patient was identified in the holding area and taken to room #8, placed supine on the table.  The right groin was prepped and draped in usual fashion.  Time-out was called.  Ultrasound was used to evaluate the right common femoral artery, it was widely patent. Additional ultrasound image was obtained.  The right femoral artery was accessed under ultrasound guidance with an 18-gauge needle.  An 0.035 wire was advanced into the aorta under fluoroscopic visualization.  A 5- French sheath was placed over the wire, and Omniflush catheter was advanced to the level of L1.  Abdominal aortogram was obtained.  Next, using Omniflush catheter and Bentson wire, the aortic bifurcation was crossed.  Catheter was placed in the left external iliac artery and left leg runoff was performed.  FINDINGS:  Aortogram:  The visualized portions of the suprarenal abdominal aorta showed no significant disease.  There are single renal arteries bilaterally, which are widely patent.  The infrarenal abdominal aorta is widely patent.  There is  luminal irregularity at the origin of the common iliac artery, this is approximately 50% stenosis.  The right hypogastric artery is widely patent.  The right external iliac artery is widely patent.  The left common, external and internal iliac arteries are widely patent.  Left lower extremity:  The left common femoral artery is widely patent. The left profunda femoral artery is widely patent.  There is a hood of an occluded bypass graft.  We visualized the native superficial femoral artery occludes in the midthigh.  There is reconstitution of the above- knee popliteal artery.  The popliteal artery is patent down across the knee.  The dominant runoff is through the peroneal artery.  Right lower extremity:  The right common femoral artery is widely patent.  The right profunda femoral artery is widely patent.  The right superficial femoral artery and popliteal artery are widely patent.  The anterior tibial is the dominant runoff.  The peroneal occludes at the ankle, the posterior tibial is diminutive.  After the above images were obtained, decision was made to terminate the procedure.  Catheter and wires were removed.  The patient was taken to the holding area for sheath pull.  IMPRESSION: 1. Luminal irregularity in the right common iliac artery approximately     50%  stenosis. 2. Occluded left femoral to above-knee popliteal bypass graft. 3. Reconstitution of the left above-knee popliteal artery with single-     vessel runoff via the peroneal artery. 4. Patent right superficial femoral and popliteal artery with two-     vessel runoff via the anterior tibial and peroneal.     Juleen China IV, MD     VWB/MEDQ  D:  06/05/2010  T:  06/06/2010  Job:  [id number]  Electronically Signed by Arelia Longest IV MD on 06/13/2010 01:28:59 PM

## 2010-06-14 NOTE — H&P (Addendum)
HISTORY AND PHYSICAL EXAMINATION  June 12, 2010  Re:  [name], [name]                 DOB:  24-Aug-1950  CHIEF COMPLAINT:  Tightness left calf with ambulation.  HISTORY OF PRESENT ILLNESS:  This is a 60 year old male patient with non- insulin diabetes mellitus and left popliteal bypass graft performed by me in 2007 with 6 mm Gore-Tex because of a small saphenous vein.  It lasted for 5 years and then he developed recurrent symptoms.  He had an angiogram performed which revealed the graft to be occluded.  He has a patent above-knee popliteal artery on the left with one vessel runoff to tibial artery.  He is now scheduled for redo left femoral-popliteal bypass graft for his severe claudication symptoms.  Chronic medical problems: 1. Type 2 diabetes mellitus. 2. Hypertension. 3. Hyperlipidemia. 4. History of tobacco abuse. 5. Negative for coronary artery disease, COPD or stroke.  SOCIAL HISTORY:  He is single and has 3 children and says he smokes about a quarter pack of cigarettes per day now but smoked up to a pack cigarettes per day for 40+ years, does not use alcohol.  FAMILY HISTORY:  Positive for stroke in his father, negative for coronary artery disease, positive for diabetes in multiple family members.  REVIEW OF SYSTEMS:  Totally negative on a complete review of systems in this gentleman.  PHYSICAL EXAMINATION:  Blood pressure 122/82, heart rate 90, respirations 18. GENERAL:  He is a thin well-developed male in no apparent distress, alert and oriented x3. HEENT:  Exam normal for age.  EOMs intact. LUNGS:  Clear to auscultation.  No rhonchi or wheezing. CARDIOVASCULAR:  Regular rhythm.  No murmurs.  Carotid pulses are 3+ with no bruits. ABDOMEN:  Soft, nontender with no masses. MUSCULOSKELETAL:  Free of major deformities. NEUROLOGIC:  Exam normal. SKIN:  Free of rashes. Lower extremity exam reveals left leg to have 3+ femoral and absent popliteal  and distal pulses.  Right leg has 3+ femoral and popliteal and 2+ dorsalis pedis pulse.  IMPRESSION:  Occlusion of left femoral-popliteal bypass graft with recurrent claudication.  Plan is to perform a redo left fem-pop graft on June 15 with Pro Patent Gore-Tex.  Risks and benefits have been thoroughly discussed and the patient would like to proceed.    [name], M.D. Electronically Signed  JDL/MEDQ  D:  06/12/2010  T:  06/13/2010  Job:  [id number]

## 2010-06-20 ENCOUNTER — Encounter (HOSPITAL_COMMUNITY)
Admission: RE | Admit: 2010-06-20 | Discharge: 2010-06-20 | Disposition: A | Discharge status: Home / Self Care | Payer: Medicare Other | Source: Ambulatory Visit | Attending: Vascular Surgery | Admitting: Vascular Surgery

## 2010-06-20 ENCOUNTER — Other Ambulatory Visit: Payer: Self-pay | Admitting: Vascular Surgery

## 2010-06-20 ENCOUNTER — Other Ambulatory Visit (HOSPITAL_COMMUNITY): Payer: Self-pay | Admitting: *Deleted

## 2010-06-20 ENCOUNTER — Ambulatory Visit (HOSPITAL_COMMUNITY)
Admission: RE | Admit: 2010-06-20 | Discharge: 2010-06-20 | Disposition: A | Discharge status: Home / Self Care | Payer: Medicare Other | Source: Ambulatory Visit | Attending: Vascular Surgery | Admitting: Vascular Surgery

## 2010-06-20 ENCOUNTER — Ambulatory Visit (HOSPITAL_COMMUNITY)
Admission: RE | Admit: 2010-06-20 | Discharge: 2010-06-20 | Disposition: A | Discharge status: Home / Self Care | Payer: Medicare Other | Source: Ambulatory Visit | Attending: *Deleted | Admitting: *Deleted

## 2010-06-20 DIAGNOSIS — Z01812 Encounter for preprocedural laboratory examination: Secondary | ICD-10-CM | POA: Insufficient documentation

## 2010-06-20 DIAGNOSIS — Z01818 Encounter for other preprocedural examination: Secondary | ICD-10-CM | POA: Insufficient documentation

## 2010-06-20 DIAGNOSIS — M069 Rheumatoid arthritis, unspecified: Secondary | ICD-10-CM

## 2010-06-20 DIAGNOSIS — I739 Peripheral vascular disease, unspecified: Secondary | ICD-10-CM

## 2010-06-20 DIAGNOSIS — R079 Chest pain, unspecified: Secondary | ICD-10-CM | POA: Insufficient documentation

## 2010-06-20 DIAGNOSIS — R0602 Shortness of breath: Secondary | ICD-10-CM | POA: Insufficient documentation

## 2010-06-20 DIAGNOSIS — F17200 Nicotine dependence, unspecified, uncomplicated: Secondary | ICD-10-CM | POA: Insufficient documentation

## 2010-06-20 LAB — COMPREHENSIVE METABOLIC PANEL
ALT: 21 U/L (ref 0–53)
AST: 19 U/L (ref 0–37)
Albumin: 4 g/dL (ref 3.5–5.2)
Alkaline Phosphatase: 72 U/L (ref 39–117)
BUN: 13 mg/dL (ref 6–23)
CO2: 30 mEq/L (ref 19–32)
Calcium: 9.8 mg/dL (ref 8.4–10.5)
Chloride: 104 mEq/L (ref 96–112)
Creatinine, Ser: 0.72 mg/dL (ref 0.4–1.5)
GFR calc Af Amer: >60 mL/min (ref >60)
GFR calc non Af Amer: >60 mL/min (ref >60)
Glucose, Bld: 157 mg/dL — ABNORMAL HIGH (ref 70–99)
Potassium: 4.8 mEq/L (ref 3.5–5.1)
Sodium: 143 mEq/L (ref 135–145)
Total Bilirubin: 0.3 mg/dL (ref 0.3–1.2)
Total Protein: 7 g/dL (ref 6.0–8.3)

## 2010-06-20 LAB — SURGICAL PCR SCREEN
MRSA, PCR: POSITIVE — AB
Staphylococcus aureus: POSITIVE — AB

## 2010-06-20 LAB — CBC
HCT: 42.9 % (ref 39.0–52.0)
Hemoglobin: 14.6 g/dL (ref 13.0–17.0)
MCH: 30.5 pg (ref 26.0–34.0)
MCHC: 34 g/dL (ref 30.0–36.0)
MCV: 89.7 fL (ref 78.0–100.0)
Platelets: 257 10*3/uL (ref 150–400)
RBC: 4.78 MIL/uL (ref 4.22–5.81)
RDW: 16.3 % — ABNORMAL HIGH (ref 11.5–15.5)
WBC: 9.2 10*3/uL (ref 4.0–10.5)

## 2010-06-20 LAB — URINALYSIS, ROUTINE W REFLEX MICROSCOPIC
Bilirubin Urine: NEGATIVE
Glucose, UA: NEGATIVE mg/dL
Hgb urine dipstick: NEGATIVE
Ketones, ur: NEGATIVE mg/dL
Leukocytes, UA: NEGATIVE
Nitrite: NEGATIVE
Protein, ur: NEGATIVE mg/dL
Specific Gravity, Urine: 1.012 (ref 1.005–1.030)
Urobilinogen, UA: 0.2 mg/dL (ref 0.0–1.0)
pH: 6 (ref 5.0–8.0)

## 2010-06-20 LAB — APTT: aPTT: 26 seconds (ref 24–37)

## 2010-06-20 LAB — PROTIME-INR
INR: 1 (ref 0.00–1.49)
Prothrombin Time: 13.4 seconds (ref 11.6–15.2)

## 2010-06-22 ENCOUNTER — Inpatient Hospital Stay (HOSPITAL_COMMUNITY)
Admission: RE | Admit: 2010-06-22 | Discharge: 2010-06-25 | DRG: 254 | Disposition: A | Discharge status: Home / Self Care | Payer: Medicare Other | Source: Ambulatory Visit | Attending: Vascular Surgery | Admitting: Vascular Surgery

## 2010-06-22 ENCOUNTER — Inpatient Hospital Stay (HOSPITAL_COMMUNITY): Payer: Medicare Other

## 2010-06-22 DIAGNOSIS — E119 Type 2 diabetes mellitus without complications: Secondary | ICD-10-CM | POA: Diagnosis present

## 2010-06-22 DIAGNOSIS — I70219 Atherosclerosis of native arteries of extremities with intermittent claudication, unspecified extremity: Secondary | ICD-10-CM

## 2010-06-22 DIAGNOSIS — Z7982 Long term (current) use of aspirin: Secondary | ICD-10-CM

## 2010-06-22 DIAGNOSIS — T82898A Other specified complication of vascular prosthetic devices, implants and grafts, initial encounter: Principal | ICD-10-CM | POA: Diagnosis present

## 2010-06-22 DIAGNOSIS — I10 Essential (primary) hypertension: Secondary | ICD-10-CM | POA: Diagnosis present

## 2010-06-22 DIAGNOSIS — Z79899 Other long term (current) drug therapy: Secondary | ICD-10-CM

## 2010-06-22 DIAGNOSIS — Y832 Surgical operation with anastomosis, bypass or graft as the cause of abnormal reaction of the patient, or of later complication, without mention of misadventure at the time of the procedure: Secondary | ICD-10-CM | POA: Diagnosis present

## 2010-06-22 HISTORY — PX: PR VEIN BYPASS GRAFT,AORTO-FEM-POP: 35551

## 2010-06-22 LAB — GLUCOSE, CAPILLARY
Glucose-Capillary: 224 mg/dL — ABNORMAL HIGH (ref 70–99)
Glucose-Capillary: 168 mg/dL — ABNORMAL HIGH (ref 70–99)
Glucose-Capillary: 267 mg/dL — ABNORMAL HIGH (ref 70–99)

## 2010-06-23 DIAGNOSIS — I70219 Atherosclerosis of native arteries of extremities with intermittent claudication, unspecified extremity: Secondary | ICD-10-CM

## 2010-06-23 LAB — GLUCOSE, CAPILLARY
Glucose-Capillary: 192 mg/dL — ABNORMAL HIGH (ref 70–99)
Glucose-Capillary: 135 mg/dL — ABNORMAL HIGH (ref 70–99)
Glucose-Capillary: 233 mg/dL — ABNORMAL HIGH (ref 70–99)
Glucose-Capillary: 116 mg/dL — ABNORMAL HIGH (ref 70–99)

## 2010-06-23 LAB — BASIC METABOLIC PANEL
BUN: 11 mg/dL (ref 6–23)
CO2: 35 mEq/L — ABNORMAL HIGH (ref 19–32)
Calcium: 8.7 mg/dL (ref 8.4–10.5)
Chloride: 99 mEq/L (ref 96–112)
Creatinine, Ser: 0.98 mg/dL (ref 0.50–1.35)
GFR calc Af Amer: >60 mL/min (ref >60)
GFR calc non Af Amer: >60 mL/min (ref >60)
Glucose, Bld: 226 mg/dL — ABNORMAL HIGH (ref 70–99)
Potassium: 3.9 mEq/L (ref 3.5–5.1)
Sodium: 137 mEq/L (ref 135–145)

## 2010-06-23 LAB — CBC
HCT: 37.4 % — ABNORMAL LOW (ref 39.0–52.0)
Hemoglobin: 12.2 g/dL — ABNORMAL LOW (ref 13.0–17.0)
MCH: 29.5 pg (ref 26.0–34.0)
MCHC: 32.6 g/dL (ref 30.0–36.0)
MCV: 90.6 fL (ref 78.0–100.0)
Platelets: 237 10*3/uL (ref 150–400)
RBC: 4.13 MIL/uL — ABNORMAL LOW (ref 4.22–5.81)
RDW: 16.8 % — ABNORMAL HIGH (ref 11.5–15.5)
WBC: 8.8 10*3/uL (ref 4.0–10.5)

## 2010-06-24 LAB — CBC
HCT: 39.2 % (ref 39.0–52.0)
Hemoglobin: 13 g/dL (ref 13.0–17.0)
MCH: 29.8 pg (ref 26.0–34.0)
MCHC: 33.2 g/dL (ref 30.0–36.0)
MCV: 89.9 fL (ref 78.0–100.0)
Platelets: 224 10*3/uL (ref 150–400)
RBC: 4.36 MIL/uL (ref 4.22–5.81)
RDW: 16.3 % — ABNORMAL HIGH (ref 11.5–15.5)
WBC: 8 10*3/uL (ref 4.0–10.5)

## 2010-06-24 LAB — BASIC METABOLIC PANEL
BUN: 11 mg/dL (ref 6–23)
CO2: 31 mEq/L (ref 19–32)
Calcium: 8.8 mg/dL (ref 8.4–10.5)
Chloride: 98 mEq/L (ref 96–112)
Creatinine, Ser: 0.73 mg/dL (ref 0.50–1.35)
GFR calc Af Amer: >60 mL/min (ref >60)
GFR calc non Af Amer: >60 mL/min (ref >60)
Glucose, Bld: 181 mg/dL — ABNORMAL HIGH (ref 70–99)
Potassium: 3.8 mEq/L (ref 3.5–5.1)
Sodium: 137 mEq/L (ref 135–145)

## 2010-06-24 LAB — GLUCOSE, CAPILLARY
Glucose-Capillary: 164 mg/dL — ABNORMAL HIGH (ref 70–99)
Glucose-Capillary: 154 mg/dL — ABNORMAL HIGH (ref 70–99)
Glucose-Capillary: 177 mg/dL — ABNORMAL HIGH (ref 70–99)
Glucose-Capillary: 127 mg/dL — ABNORMAL HIGH (ref 70–99)

## 2010-06-25 LAB — CROSSMATCH
ABO/RH(D): O POS
Antibody Screen: NEGATIVE
Unit division: 0
Unit division: 0

## 2010-06-25 LAB — GLUCOSE, CAPILLARY
Glucose-Capillary: 183 mg/dL — ABNORMAL HIGH (ref 70–99)
Glucose-Capillary: 164 mg/dL — ABNORMAL HIGH (ref 70–99)

## 2010-06-25 NOTE — Op Note (Signed)
[name], [name] NO.:  [id number]  MEDICAL RECORD NO.:  [id number]  LOCATION:  3305                         FACILITY:  [facility]  PHYSICIAN:  Quita Skye. Hart Rochester, M.D.  DATE OF BIRTH:  [birth year]  DATE OF PROCEDURE:  06/22/2010 DATE OF DISCHARGE:                              OPERATIVE REPORT   PREOPERATIVE DIAGNOSIS:  Left femoral-popliteal and tibial occlusive disease with occluded previous left femoral-popliteal bypass graft.  POSTOPERATIVE DIAGNOSIS:  Left femoral-popliteal and tibial occlusive disease with occluded previous left femoral-popliteal bypass graft.  OPERATIONS:  Redo left common femoral to popliteal (above-knee) bypass using a 6-mm Gore-Tex -Propaten bypass.  Intraoperative arteriogram x2.  SURGEON:  Quita Skye. Hart Rochester, MD  FIRST ASSISTANT:  Newton Pigg, [place]  ANESTHESIA:  General endotracheal.  HISTORY:  The patient had a left femoral-popliteal bypass graft performed in 2007, which lasted 5 years had recently occluded and now angiograms reveal a patent above-knee popliteal segment with significant disease below the knee best vessel being the posterior tibial artery, he was scheduled for redo femoral-popliteal bypass to the above-knee segment.  He does have remaining saphenous vein.  PROCEDURE:  The patient was taken to the operating room, placed in supine position at which time satisfactory general endotracheal anesthesia was administered.  The left leg was prepped with Betadine scrub and solution draped in routine sterile manner.  A short longitudinal incision was made in the inguinal area through the previous scar, carried down through subcutaneous tissue.  Care was taken not to injure the saphenous vein which was not exposed.  The old Gore-Tex graft was identified where it was anastomosed to the common femoral artery, proximal control of the common femoral was obtained just below the inguinal ligament and the superficial femoral and  profunda femoris arteries were both exposed for distal control.  The medial incision was made through the previous scar in the above-knee position in the distal thigh again avoiding the saphenous vein.  Popliteal artery was exposed just distal to the previous anastomosis.  It was a soft vessel which was of good caliber with very mild occlusive disease.  A new set subfascial anatomic tunnel was created and a 6-mm Gore-Tex Propaten graft delivered through the tunnel.  The patient was heparinized.  Popliteal artery occluded proximally and distally opened with a 15-blade, extended with Potts scissors, would accept a 4.5 and 5-mm dilator distally.  Gore-Tex was spatulated and anastomosed end-to-side with 6-0 Prolene.  Clamps released.  Attention turned to the inguinal area where the vessels were occluded with clamps.  The previous graft was transected and excised up to the previous anastomosis was about a 2 mm rim of cortex being left circumferentially and arteriotomy extended into the native artery proximally.  The new graft was spatulated and anastomosed end-to-side with 6-0 Prolene.  Clamps released.  There was an excellent pulse in the graft.  Doppler flow in the posterior tibial artery was present and brisk, but somewhat monophasic.  Intraoperative arteriogram revealed a widely patent anastomosis with no narrowing of the popliteal artery with not good filling of the tibial vessels on the initial view, a second angiogram was performed which revealed sluggish flow through three diseased  vessels down to the lower third of the leg.  Protamine was given to reverse the heparin, following adequate hemostasis, the wound was irrigated with saline and closed in layers with Vicryl in subcuticular fashion with Dermabond.  The patient was taken to recovery room in stable condition.     Quita Skye Hart Rochester, M.D.     JDL/MEDQ  D:  06/22/2010  T:  06/23/2010  Job:  [id number]  Electronically Signed by  Josephina Gip M.D. on 06/25/2010 11:42:19 AM

## 2010-06-29 ENCOUNTER — Ambulatory Visit (INDEPENDENT_AMBULATORY_CARE_PROVIDER_SITE_OTHER): Payer: Medicare Other

## 2010-06-29 DIAGNOSIS — I7092 Chronic total occlusion of artery of the extremities: Secondary | ICD-10-CM

## 2010-06-29 NOTE — Assessment & Plan Note (Signed)
OFFICE VISIT  [name], [name] DOB:  [birth year]                                       06/22/2010 CHART#:[id number]  [name] returns today with concerns of bruising on his left inner thigh, as he is status post left femoral to popliteal above knee bypass using a 6-mm Gore-Tex Propaten bypass graft and intraoperative arteriogram x2 on June 22, 2010.  Upon inspection of his thigh, his incisions are healing nicely and clean, dry and intact.  He does have some bruising on his thigh but no evidence of hematoma.  The patient denies any fevers or any other concerns.  PHYSICAL EXAMINATION:  Vital signs:  Today, blood pressure in the left arm is 131/95, heart rate of 84 and 99% O2 saturation on room air, respirations 14.  His incisions are healing nicely.  He has 2+ dorsalis pedal pulse that is palpable on his left foot.  The patient does have a follow-up appointment with Dr. Hart Rochester on July 3, and he will keep this appointment.  Newton Pigg, [place]  Fransisco Hertz, MD Electronically Signed  SE/MEDQ  D:  06/29/2010  T:  06/29/2010  Job:  [id number]

## 2010-07-06 ENCOUNTER — Encounter: Payer: Self-pay | Admitting: Cardiovascular Disease

## 2010-07-10 ENCOUNTER — Ambulatory Visit: Payer: Medicare Other | Admitting: Vascular Surgery

## 2010-07-10 ENCOUNTER — Ambulatory Visit (INDEPENDENT_AMBULATORY_CARE_PROVIDER_SITE_OTHER): Payer: Medicare Other | Admitting: Vascular Surgery

## 2010-07-10 DIAGNOSIS — I70219 Atherosclerosis of native arteries of extremities with intermittent claudication, unspecified extremity: Secondary | ICD-10-CM

## 2010-07-10 NOTE — Assessment & Plan Note (Signed)
OFFICE VISIT  RAN, TULLIS DOB:  1950-12-24                                       07/10/2010 CHART#:[id number]  The patient returns 3 weeks post left femoral to above knee popliteal bypass with 6 mm Gore-Tex.  He has essentially single vessel runoff to the posterior tibial artery which is diseased.  His claudication symptoms are completely relieved.  He has had some shooting pains in the groin and thigh area which would be expected but no distal edema.  PHYSICAL EXAMINATION:  Blood pressure 134/98, heart rate is 84, respirations 14, temperature 97.9.  Both leg incisions on the left are nicely healed.  He has a 3+ femoral and popliteal pulse and 1-2+ posterior tibial pulse.  The foot is well-perfused.  I have reassured him regarding these findings.  I did give him a refill of his hydrocodone/acetaminophen 7.5/500 #20 and he will take over-the- counter medications following this prescription.  He will return in 3 month for followup in the vascular lab.    Quita Skye Hart Rochester, M.D. Electronically Signed  JDL/MEDQ  D:  07/10/2010  T:  07/10/2010  Job:  [id number]

## 2010-07-25 NOTE — Discharge Summary (Signed)
  [name], [name]               ACCOUNT NO.:  [id number]  MEDICAL RECORD NO.:  [id number]  LOCATION:  2030                         FACILITY:  [facility]  PHYSICIAN:  Quita Skye. Hart Rochester, M.D.  DATE OF BIRTH:  05/19/1950  DATE OF ADMISSION:  06/22/2010 DATE OF DISCHARGE:  06/25/2010                              DISCHARGE SUMMARY   ADMISSION DIAGNOSIS:  Left femoral, popliteal, and tibial occlusive disease with occluded previous left femoral to popliteal bypass graft.  HISTORY OF PRESENT ILLNESS:  This patient had a left femoral to popliteal bypass graft performed in 2007 which lasted 5 years and had recently occluded and now angiograms revealed patent above-knee popliteal segment with significant disease below the knee with the vessel being popliteal tibial artery.  [name] was scheduled for redo femoral popliteal bypass to above-knee segment.  [name] does have remaining saphenous vein.  HOSPITAL COURSE:  The patient was admitted and taken to the operating room on June 22, 2010, where [name] underwent a redo left common femoral to popliteal above knee bypass using a 6-mm Gore-Tex for patent bypass. There was an intraoperative arteriogram x2.  [name] tolerated procedure well and transported to the recovery room in satisfactory condition.  By postoperative day #1, [name] was doing well and was transported to the telemetry floor that day.  [name] did have some nausea that improved by postoperative day #2.  By postoperative day #3, [name] was doing well and was discharged home.  [name] did have some bright red blood per rectum, but the patient does have hemorrhoids.  Otherwise, his postoperative course included increasing ambulation as well as increasing intake of solids without difficulty.  DISCHARGE DIAGNOSES: 1. Left femoral to popliteal and tibial occlusive disease with     occluded previous left fem-pop bypass graft.     a.     Status post left redo common femoral to popliteal above knee      bypass and  intraoperative arteriogram on June 22, 2010. 2. Coronary artery disease. 3. Remote tobacco use. 4. Stable right pulmonary nodules followed by Dr. Sherril Croon. 5. Type 2 diabetes. 6. Hypertension. 7. Dyslipidemia. 8. Rheumatoid arthritis.  DISCHARGE MEDICATIONS: 1. Aspirin 81 mg p.o. daily. 2. Atorvastatin 10 mg p.o. daily. 3. Celebrex 200 mg p.o. daily. 4. Glipizide 10 mg p.o. daily. 5. HCTZ 25 mg p.o. daily. 6. Hydrocodone 7.5/500, 1-2 p.o. q.6 h. p.r.n. pain #30, no refill. 7. Omeprazole 20 mg p.o. daily. 8. Testosterone injection every 2 weeks.  FOLLOWUP:  The patient is to follow up with Dr. Hart Rochester in 2 weeks.     Brett Pigg, PA   ______________________________ Quita Skye Hart Rochester, M.D.    SE/MEDQ  D:  07/10/2010  T:  07/11/2010  Job:  [id number]  cc:   Macarthur Critchley. [name], M.D. [name], MD,FACC  Electronically Signed by Brett Pigg PA on 07/11/2010 08:37:16 AM Electronically Signed by Josephina Gip M.D. on 07/25/2010 02:55:49 PM

## 2010-08-14 ENCOUNTER — Ambulatory Visit: Payer: Medicare Other | Admitting: Vascular Surgery

## 2010-10-10 ENCOUNTER — Encounter (INDEPENDENT_AMBULATORY_CARE_PROVIDER_SITE_OTHER): Payer: Medicare Other | Admitting: *Deleted

## 2010-10-10 DIAGNOSIS — I739 Peripheral vascular disease, unspecified: Secondary | ICD-10-CM

## 2010-10-10 DIAGNOSIS — Z48812 Encounter for surgical aftercare following surgery on the circulatory system: Secondary | ICD-10-CM

## 2010-10-16 ENCOUNTER — Encounter: Payer: Self-pay | Admitting: Vascular Surgery

## 2010-10-16 NOTE — Procedures (Unsigned)
BYPASS GRAFT EVALUATION  INDICATION:  Followup left bypass graft placement.  HISTORY: Diabetes:  Yes Cardiac:  No Hypertension:  Yes Smoking:  Yes Previous Surgery:  Left femoral-to-popliteal bypass graft 11/06/2005. Redo of left femoral-to-popliteal bypass graft 06/22/2010.  SINGLE LEVEL ARTERIAL EXAM                              RIGHT              LEFT Brachial:                    130                132 Anterior tibial:             126                120 Posterior tibial:            128                151 Peroneal: Ankle/brachial index:        0.97               1.14  PREVIOUS ABI:  Date: 05/29/2010  RIGHT:  1.02  LEFT:  0.63  LOWER EXTREMITY BYPASS GRAFT DUPLEX EXAM:  DUPLEX:  Patent left femoral-to-popliteal bypass graft with no evidence of restenosis.  IMPRESSION: 1. Bilateral ankle-brachial indices are within normal limits. 2. The right ankle-brachial indices remains stable and the left ankle-     brachial index has improved significantly. 3. Patent left femoral-to-popliteal bypass graft with velocity     measurements shown on the following worksheet.  ___________________________________________ Quita Skye. Hart Rochester, M.D.  EM/MEDQ  D:  10/11/2010  T:  10/11/2010  Job:  [id number]

## 2010-10-18 ENCOUNTER — Other Ambulatory Visit: Payer: Self-pay | Admitting: Vascular Surgery

## 2010-10-18 DIAGNOSIS — Z48812 Encounter for surgical aftercare following surgery on the circulatory system: Secondary | ICD-10-CM

## 2010-10-18 DIAGNOSIS — I739 Peripheral vascular disease, unspecified: Secondary | ICD-10-CM

## 2010-12-05 ENCOUNTER — Encounter: Payer: Self-pay | Admitting: Vascular Surgery

## 2010-12-11 ENCOUNTER — Other Ambulatory Visit: Payer: Medicare Other

## 2010-12-11 ENCOUNTER — Ambulatory Visit: Payer: Medicare Other | Admitting: Vascular Surgery

## 2010-12-24 ENCOUNTER — Encounter: Payer: Self-pay | Admitting: Vascular Surgery

## 2010-12-25 ENCOUNTER — Ambulatory Visit (INDEPENDENT_AMBULATORY_CARE_PROVIDER_SITE_OTHER): Payer: Medicare Other | Admitting: Vascular Surgery

## 2010-12-25 ENCOUNTER — Encounter: Payer: Self-pay | Admitting: Vascular Surgery

## 2010-12-25 ENCOUNTER — Other Ambulatory Visit: Payer: Medicare Other

## 2010-12-25 VITALS — BP 118/83 | HR 68 | Resp 16 | Ht 70.0 in | Wt 183.0 lb

## 2010-12-25 DIAGNOSIS — Z48812 Encounter for surgical aftercare following surgery on the circulatory system: Secondary | ICD-10-CM

## 2010-12-25 DIAGNOSIS — I70219 Atherosclerosis of native arteries of extremities with intermittent claudication, unspecified extremity: Secondary | ICD-10-CM

## 2010-12-25 DIAGNOSIS — Z95828 Presence of other vascular implants and grafts: Secondary | ICD-10-CM

## 2010-12-25 DIAGNOSIS — I739 Peripheral vascular disease, unspecified: Secondary | ICD-10-CM

## 2010-12-25 DIAGNOSIS — Z9889 Other specified postprocedural states: Secondary | ICD-10-CM

## 2010-12-25 NOTE — Progress Notes (Signed)
Addended by: Sharee Pimple on: 12/25/2010 11:33 AM   Modules accepted: Orders

## 2010-12-25 NOTE — Progress Notes (Signed)
Subjective:     Patient ID: [name], male   DOB: [birth year], 60 y.o.   MRN: [id number]  HPI this 60 year old male returns for continued followup regarding his redo left femoral-popliteal bypass graft performed in June of 2012 using 6 mm Gore-Tex. His claudication symptoms are completely relieved. He does have some occasional discomfort in his left anterior thigh and popliteal fossa which is transient. He denies pain or numbness in the left foot and has minimal symptoms in the contralateral right leg.  Past Medical History  Diagnosis Date  . Diabetes mellitus     borderline. Now taking oral hypoglycemics  . CAD (coronary artery disease)     nonobstrtuctive by cath 2010  . PVD (peripheral vascular disease)     s/p left fem-pop bypass 2007  . Rheumatoid arthritis   . GERD (gastroesophageal reflux disease)   . Hypertension   . Hyperlipidemia   . COPD (chronic obstructive pulmonary disease)   . Leg pain   . Bronchitis   . Carotid artery occlusion     History  Substance Use Topics  . Smoking status: Former Smoker -- 40 years    Types: Cigarettes    Quit date: 01/07/2009  . Smokeless tobacco: Not on file   Comment:    . Alcohol Use: Yes     occasional beer    Family History  Problem Relation Age of Onset  . Heart attack Mother 108  . Stroke Father     No Known Allergies  Current outpatient prescriptions:aspirin 81 MG tablet, Take 81 mg by mouth daily.  , Disp: , Rfl: ;  atorvastatin (LIPITOR) 10 MG tablet, Take 10 mg by mouth daily.  , Disp: , Rfl: ;  celecoxib (CELEBREX) 200 MG capsule, Take 200 mg by mouth daily.  , Disp: , Rfl: ;  glipiZIDE (GLUCOTROL) 5 MG tablet, Take 5 mg by mouth daily.  , Disp: , Rfl: ;  hydrochlorothiazide 25 MG tablet, Take 25 mg by mouth daily.  , Disp: , Rfl:  OMEPRAZOLE PO, Take by mouth.  , Disp: , Rfl:   BP 118/83  Pulse 68  Resp 16  Ht 5\' 10"  (1.778 m)  Wt 183 lb (83.008 kg)  BMI 26.26 kg/m2  SpO2 98%  Body mass index is 26.26  kg/(m^2).        Review of Systems he denies chest pain, dyspnea on exertion, PND, orthopnea, claudication, lateralizing weakness, aphasia, amaurosis fugax, syncope. He occasionally has some discomfort in his left neck and upper chest area from previous cervical spine surgery. All other systems are negative and a complete review of systems    Objective:   Physical Exam or pressure 118/83 heart rate 68 respirations 16 General well-developed well-nourished male no apparent stress HEENT normal for age Lungs no rhonchi or wheezing Cardiovascular regular and no murmurs carotid pulses 3+ no audible bruits Abdomen soft nontender no masses Lower extremity exam reveals 3+ femoral and 2-3+ popliteal pulse on the left. Left foot is well-perfused. Incisions are well-healed. Right leg has 3+ femoral pulse with a well-perfused right foot. Musculoskeletal free major deformities Neurologic exam normal  Data ordered lower extremity arterial Dopplers and a duplex scan which are reviewed and interpreted. ABI on the left leg is 1.1 in the scan of the bypass reveals no areas of stenosis.     Assessment:     Nicely functioning left femoral-popliteal-redo-bypass using 6 mm Gore-Tex    Plan:     Continue to follow in the vascular  lab for patency of bypass

## 2010-12-25 NOTE — Progress Notes (Signed)
LLE arterial duplex performed @ [facility] 12/25/2010

## 2010-12-25 NOTE — Progress Notes (Signed)
ABI performed @ [facility] 12/25/2010

## 2011-01-08 HISTORY — PX: COLONOSCOPY: SHX174

## 2011-01-14 NOTE — Procedures (Unsigned)
BYPASS GRAFT EVALUATION  INDICATION:  Peripheral vascular disease.  HISTORY: Diabetes:  Yes. Cardiac: Hypertension:  Yes. Smoking:  Currently. Previous Surgery:  Left femoral to popliteal artery bypass on 11/06/2005; left femoral-popliteal artery bypass graft revision, 06/22/2010.  SINGLE LEVEL ARTERIAL EXAM                              RIGHT              LEFT Brachial: Anterior tibial: Posterior tibial: Peroneal: Ankle/brachial index:        0.92/ - [place]         1.10.  TOE BRACHIAL INDEX  RIGHT:  0.64  LEFT:  0.48   PREVIOUS ABI:  Date:  10/10/2010  RIGHT:  0.97  LEFT:  1.14   LOWER EXTREMITY BYPASS GRAFT DUPLEX EXAM:  DUPLEX:  Mild heterogenous plaque present at the popliteal artery and tibioperoneal trunk of the left lower extremity.     IMPRESSION: 1. Patent left femoral-to-popliteal artery bypass graft present. 2. Mild plaquing, as noted above. 3. Right ankle brachial index is considered inaccurate, most likely     due to vessel wall calcification. 4. Left ankle brachial index is 1.10 and considered normal. 5. Right toe brachial index is 0.64 and considered asymptomatic. 6. Left toe brachial index is 0.48 and considered in the claudicant     range. 7. Minimal changes present since previous study done 10/10/2010.       ___________________________________________ Quita Skye. Hart Rochester, M.D.  SH/MEDQ  D:  12/25/2010  T:  12/25/2010  Job:  [id number]

## 2011-02-06 DIAGNOSIS — M159 Polyosteoarthritis, unspecified: Secondary | ICD-10-CM | POA: Insufficient documentation

## 2011-05-01 ENCOUNTER — Other Ambulatory Visit: Payer: Self-pay | Admitting: Neurosurgery

## 2011-05-01 DIAGNOSIS — M542 Cervicalgia: Secondary | ICD-10-CM

## 2011-05-07 ENCOUNTER — Ambulatory Visit
Admission: RE | Admit: 2011-05-07 | Discharge: 2011-05-07 | Disposition: A | Discharge status: Home / Self Care | Payer: Medicare Other | Source: Ambulatory Visit | Attending: Neurosurgery | Admitting: Neurosurgery

## 2011-05-07 VITALS — BP 134/77 | HR 72

## 2011-05-07 DIAGNOSIS — M542 Cervicalgia: Secondary | ICD-10-CM

## 2011-05-07 MED ORDER — ONDANSETRON HCL 4 MG/2ML IJ SOLN
4.0000 mg | Freq: Once | INTRAMUSCULAR | Status: AC
  Administered 2011-05-07: 4 mg via INTRAMUSCULAR

## 2011-05-07 MED ORDER — IOHEXOL 300 MG/ML  SOLN
10.0000 mL | Freq: Once | INTRAMUSCULAR | Status: AC | PRN
  Administered 2011-05-07: 10 mL via INTRATHECAL

## 2011-05-07 MED ORDER — DIAZEPAM 5 MG PO TABS
10.0000 mg | ORAL_TABLET | Freq: Once | ORAL | Status: AC
  Administered 2011-05-07: 10 mg via ORAL

## 2011-05-07 MED ORDER — HYDROMORPHONE HCL PF 2 MG/ML IJ SOLN
2.0000 mg | Freq: Once | INTRAMUSCULAR | Status: AC
  Administered 2011-05-07: 2 mg via INTRAMUSCULAR

## 2011-05-07 NOTE — Discharge Instructions (Signed)

## 2011-05-25 ENCOUNTER — Other Ambulatory Visit: Payer: Self-pay | Admitting: Emergency Medicine

## 2011-05-25 ENCOUNTER — Encounter: Payer: Self-pay | Admitting: Cardiology

## 2011-05-25 ENCOUNTER — Encounter: Payer: Self-pay | Admitting: Emergency Medicine

## 2011-06-24 ENCOUNTER — Other Ambulatory Visit: Payer: Self-pay | Admitting: Cardiology

## 2011-06-24 ENCOUNTER — Encounter: Payer: Self-pay | Admitting: Neurosurgery

## 2011-06-24 ENCOUNTER — Ambulatory Visit (INDEPENDENT_AMBULATORY_CARE_PROVIDER_SITE_OTHER): Payer: Medicare Other | Admitting: Cardiology

## 2011-06-24 ENCOUNTER — Encounter: Payer: Self-pay | Admitting: *Deleted

## 2011-06-24 ENCOUNTER — Telehealth: Payer: Self-pay | Admitting: Cardiology

## 2011-06-24 ENCOUNTER — Encounter: Payer: Self-pay | Admitting: Cardiology

## 2011-06-24 VITALS — BP 123/81 | HR 89 | Ht 70.0 in | Wt 179.0 lb

## 2011-06-24 DIAGNOSIS — I519 Heart disease, unspecified: Secondary | ICD-10-CM

## 2011-06-24 DIAGNOSIS — I2510 Atherosclerotic heart disease of native coronary artery without angina pectoris: Secondary | ICD-10-CM

## 2011-06-24 DIAGNOSIS — R011 Cardiac murmur, unspecified: Secondary | ICD-10-CM

## 2011-06-24 DIAGNOSIS — I70219 Atherosclerosis of native arteries of extremities with intermittent claudication, unspecified extremity: Secondary | ICD-10-CM

## 2011-06-24 DIAGNOSIS — I739 Peripheral vascular disease, unspecified: Secondary | ICD-10-CM

## 2011-06-24 DIAGNOSIS — R072 Precordial pain: Secondary | ICD-10-CM

## 2011-06-24 DIAGNOSIS — Z95828 Presence of other vascular implants and grafts: Secondary | ICD-10-CM

## 2011-06-24 DIAGNOSIS — Z9889 Other specified postprocedural states: Secondary | ICD-10-CM

## 2011-06-24 DIAGNOSIS — Z01810 Encounter for preprocedural cardiovascular examination: Secondary | ICD-10-CM

## 2011-06-24 NOTE — Telephone Encounter (Signed)
Complete Echo -scheduled for 06/26/2011 @ [facility]  Dobutamine Echo -scheduled for 07-02-2011 @ [facility]  Checking percert

## 2011-06-24 NOTE — Patient Instructions (Signed)
   Baseline Echo  Dobutamine Echo   Our office will contact you with results Your physician wants you to follow up in:  1 year.  You will receive a reminder letter in the mail one-two months in advance.  If you don't receive a letter, please call our office to schedule the follow up appointment

## 2011-06-25 ENCOUNTER — Ambulatory Visit (INDEPENDENT_AMBULATORY_CARE_PROVIDER_SITE_OTHER): Payer: Medicare Other | Admitting: Neurosurgery

## 2011-06-25 ENCOUNTER — Ambulatory Visit (INDEPENDENT_AMBULATORY_CARE_PROVIDER_SITE_OTHER): Payer: Medicare Other | Admitting: Vascular Surgery

## 2011-06-25 VITALS — BP 138/93 | HR 63 | Resp 20 | Ht 70.0 in | Wt 176.0 lb

## 2011-06-25 DIAGNOSIS — Z48812 Encounter for surgical aftercare following surgery on the circulatory system: Secondary | ICD-10-CM

## 2011-06-25 DIAGNOSIS — I739 Peripheral vascular disease, unspecified: Secondary | ICD-10-CM

## 2011-06-25 NOTE — Progress Notes (Signed)
[facility] HISTORY AND PHYSICAL   CC: Six-month postop followup ABIs and graft Referring Physician: Hart Rochester  History of Present Illness: 61 year old male patient of Dr. Hart Rochester who is status post a left femoropopliteal revision in June 2012. The patient denies any signs or symptoms claudication, rest pain and has no open wounds on his lower extremities. Patient also denies any new medical diagnoses or recent surgeries.  Past Medical History  Diagnosis Date  . Diabetes mellitus     borderline. Now taking oral hypoglycemics  . CAD (coronary artery disease)     nonobstrtuctive by cath 2010  . PVD (peripheral vascular disease)     s/p left fem-pop bypass 2007  . Rheumatoid arthritis   . GERD (gastroesophageal reflux disease)   . Hypertension   . Hyperlipidemia   . COPD (chronic obstructive pulmonary disease)   . Leg pain   . Bronchitis   . Carotid artery occlusion     ROS: [x]  Positive   [ ]  Denies    General: [ ]  Weight loss, [ ]  Fever, [ ]  chills Neurologic: [ ]  Dizziness, [ ]  Blackouts, [ ]  Seizure [ ]  Stroke, [ ]  "Mini stroke", [ ]  Slurred speech, [ ]  Temporary blindness; [ ]  weakness in arms or legs, [ ]  Hoarseness Cardiac: [ ]  Chest pain/pressure, [ ]  Shortness of breath at rest [ ]  Shortness of breath with exertion, [ ]  Atrial fibrillation or irregular heartbeat Vascular: [ ]  Pain in legs with walking, [ ]  Pain in legs at rest, [ ]  Pain in legs at night,  [ ]  Non-healing ulcer, [ ]  Blood clot in vein/DVT,   Pulmonary: [ ]  Home oxygen, [ ]  Productive cough, [ ]  Coughing up blood, [ ]  Asthma,  [ ]  Wheezing Musculoskeletal:  [ ]  Arthritis, [ ]  Low back pain, [ ]  Joint pain Hematologic: [ ]  Easy Bruising, [ ]  Anemia; [ ]  Hepatitis Gastrointestinal: [ ]  Blood in stool, [ ]  Gastroesophageal Reflux/heartburn, [ ]  Trouble swallowing Urinary: [ ]  chronic Kidney disease, [ ]  on HD - [ ]  MWF or [ ]  TTHS, [ ]  Burning with urination, [ ]  Difficulty  urinating Skin: [ ]  Rashes, [ ]  Wounds Psychological: [ ]  Anxiety, [ ]  Depression   Social History History  Substance Use Topics  . Smoking status: Current Everyday Smoker -- 0.1 packs/day for 40 years    Types: Cigarettes, Cigars  . Smokeless tobacco: Never Used   Comment:  quit smoking cigarettes last time was 08/2010  . Alcohol Use: Yes     occasional beer    Family History Family History  Problem Relation Age of Onset  . Heart attack Mother 80  . Stroke Father     No Known Allergies  Current Outpatient Prescriptions  Medication Sig Dispense Refill  . aspirin 81 MG tablet Take 81 mg by mouth daily.        Marland Kitchen atorvastatin (LIPITOR) 20 MG tablet Take 20 mg by mouth daily.      . celecoxib (CELEBREX) 200 MG capsule Take 200 mg by mouth daily.        Marland Kitchen glipiZIDE (GLUCOTROL) 10 MG tablet Take 10 mg by mouth daily.      . hydrochlorothiazide 25 MG tablet Take 25 mg by mouth daily.        . metFORMIN (GLUCOPHAGE) 500 MG tablet Take 500 mg by mouth daily with breakfast.      . omeprazole (PRILOSEC) 20 MG capsule Take 20 mg  by mouth daily.        Physical Examination  Filed Vitals:   06/25/11 1029  BP: 138/93  Pulse:   Resp:     Body mass index is 25.25 kg/(m^2).  General:  WDWN in NAD Gait: Normal HEENT: WNL Eyes: Pupils equal Pulmonary: normal non-labored breathing , without Rales, rhonchi,  wheezing Cardiac: RRR, without  Murmurs, rubs or gallops; No carotid bruits Abdomen: soft, NT, no masses Skin: no rashes, ulcers noted Vascular Exam/Pulses: Patient has palpable femoral and popliteal pulses bilaterally, pedal pulses are palpable but dampened  Extremities without ischemic changes, no Gangrene , no cellulitis; no open wounds;  Musculoskeletal: no muscle wasting or atrophy  Neurologic: A&O X 3; Appropriate Affect ; SENSATION: normal; MOTOR FUNCTION:  moving all extremities equally. Speech is fluent/normal  Non-Invasive Vascular Imaging: Lower extremity  arterial duplex shows a triphasic graft, ABIs are 1.01 on the right, 1.13 on the left  ASSESSMENT/PLAN: This is a patient that is one year status post graft revision doing well. He will followup in 6 months with repeat lower extremity graft duplex as well as ABIs. He is in agreement with this, his questions were encouraged and answered.  Lauree Chandler ANP  Clinic M.D.: Hart Rochester

## 2011-06-26 DIAGNOSIS — R072 Precordial pain: Secondary | ICD-10-CM

## 2011-06-26 NOTE — Addendum Note (Signed)
Addended by: Sharee Pimple on: 06/26/2011 10:07 AM   Modules accepted: Orders

## 2011-07-01 NOTE — Telephone Encounter (Signed)
For Stress Echo=Auth#[id number] expires 08-15-11

## 2011-07-02 DIAGNOSIS — R072 Precordial pain: Secondary | ICD-10-CM

## 2011-07-02 NOTE — Procedures (Unsigned)
BYPASS GRAFT EVALUATION  INDICATION:  Peripheral vascular disease  HISTORY: Diabetes:  Yes Cardiac:  No Hypertension:  Yes Smoking:  Currently Previous Surgery:  Left femoral to popliteal artery bypass graft 11/06/2005; left femoral to popliteal artery bypass graft revision 06/22/2010  SINGLE LEVEL ARTERIAL EXAM                              RIGHT              LEFT Brachial: Anterior tibial: Posterior tibial: Peroneal: Ankle/brachial index:        1.01               1.13  TOE BRACHIAL INDEX RIGHT:  0.50  LEFT:  0.53   PREVIOUS ABI:  Date:  12/25/2010  RIGHT:  0.92  LEFT:  1.10  PREVIOUS TOE BRACHIAL INDEX:  Date:  12/25/2010  RIGHT:  0.64  LEFT: 0.48  LOWER EXTREMITY BYPASS GRAFT DUPLEX EXAM:  DUPLEX:  Mild diffuse heterogeneous plaque present involving the left distal external iliac, common femoral and popliteal arteries without hemodynamic changes present.  IMPRESSION: 1. Patent left femoral to popliteal artery bypass graft. 2. Bilateral ankle brachial indices are  greater than 0.95 and     considered in the normal range.  Right tibial and dorsalis pedis     arteries present with monophasic flow suggesting disease. 3. Bilateral toe brachial indices are in claudicant range. 4. Essentially unchanged since previous study on 12/25/2010.  ___________________________________________ Quita Skye. Hart Rochester, M.D.  SH/MEDQ  D:  06/25/2011  T:  06/25/2011  Job:  [id number]

## 2011-07-17 ENCOUNTER — Telehealth: Payer: Self-pay | Admitting: *Deleted

## 2011-07-17 NOTE — Telephone Encounter (Signed)
Patient informed. 

## 2011-07-17 NOTE — Telephone Encounter (Signed)
Message copied by Eustace Moore on Wed Jul 17, 2011  8:35 AM ------      Message from: [name]      Created: Tue Jul 09, 2011 10:44 AM       Positive stress test - please add to gene schedule asap to set up for cath.

## 2011-07-17 NOTE — Telephone Encounter (Signed)
Patient informed and given appointment for 07/24/11 @1 :30pm Haynes Hoehn NP at [facility] Office per Caryn Bee.

## 2011-07-17 NOTE — Telephone Encounter (Signed)
Message copied by Eustace Moore on Wed Jul 17, 2011  8:30 AM ------      Message from: [name]      Created: Tue Jul 09, 2011 10:37 AM       ECHO stable

## 2011-07-24 ENCOUNTER — Other Ambulatory Visit: Payer: Self-pay | Admitting: Nurse Practitioner

## 2011-07-24 ENCOUNTER — Ambulatory Visit (INDEPENDENT_AMBULATORY_CARE_PROVIDER_SITE_OTHER): Payer: Medicare Other | Admitting: Nurse Practitioner

## 2011-07-24 ENCOUNTER — Encounter: Payer: Self-pay | Admitting: Nurse Practitioner

## 2011-07-24 VITALS — BP 130/80 | HR 80 | Ht 70.0 in | Wt 175.6 lb

## 2011-07-24 DIAGNOSIS — I2510 Atherosclerotic heart disease of native coronary artery without angina pectoris: Secondary | ICD-10-CM

## 2011-07-24 DIAGNOSIS — Z01810 Encounter for preprocedural cardiovascular examination: Secondary | ICD-10-CM

## 2011-07-24 DIAGNOSIS — R079 Chest pain, unspecified: Secondary | ICD-10-CM

## 2011-07-24 LAB — CBC WITH DIFFERENTIAL/PLATELET
Basophils Absolute: 0 10*3/uL (ref 0.0–0.1)
Basophils Relative: 0.4 % (ref 0.0–3.0)
Eosinophils Absolute: 0.2 10*3/uL (ref 0.0–0.7)
Eosinophils Relative: 1.7 % (ref 0.0–5.0)
HCT: 43.8 % (ref 39.0–52.0)
Hemoglobin: 14.3 g/dL (ref 13.0–17.0)
Lymphocytes Relative: 29.3 % (ref 12.0–46.0)
Lymphs Abs: 2.6 10*3/uL (ref 0.7–4.0)
MCHC: 32.7 g/dL (ref 30.0–36.0)
MCV: 91.4 fl (ref 78.0–100.0)
Monocytes Absolute: 0.5 10*3/uL (ref 0.1–1.0)
Monocytes Relative: 5.1 % (ref 3.0–12.0)
Neutro Abs: 5.7 10*3/uL (ref 1.4–7.7)
Neutrophils Relative %: 63.5 % (ref 43.0–77.0)
Platelets: 214 10*3/uL (ref 150.0–400.0)
RBC: 4.79 Mil/uL (ref 4.22–5.81)
RDW: 17.6 % — ABNORMAL HIGH (ref 11.5–14.6)
WBC: 9.1 10*3/uL (ref 4.5–10.5)

## 2011-07-24 LAB — BASIC METABOLIC PANEL
BUN: 18 mg/dL (ref 6–23)
CO2: 29 mEq/L (ref 19–32)
Calcium: 9.3 mg/dL (ref 8.4–10.5)
Chloride: 108 mEq/L (ref 96–112)
Creatinine, Ser: 0.9 mg/dL (ref 0.4–1.5)
GFR: 111.91 mL/min (ref >60.00)
Glucose, Bld: 92 mg/dL (ref 70–99)
Potassium: 4.5 mEq/L (ref 3.5–5.1)
Sodium: 145 mEq/L (ref 135–145)

## 2011-07-24 LAB — PROTIME-INR
INR: 1.1 ratio — ABNORMAL HIGH (ref 0.8–1.0)
Prothrombin Time: 11.7 s (ref 10.2–12.4)

## 2011-07-24 LAB — APTT: aPTT: 30.8 s — ABNORMAL HIGH (ref 21.7–28.8)

## 2011-07-24 NOTE — Assessment & Plan Note (Signed)
He presents with atypical chest pain. He has multiple CV risk factors including DM, HTN, HLD and PVD with ongoing tobacco abuse. Has had an abnormal stress echo and is referred on for cardiac cath. Will arrange for next Friday. Hold Glucophage after next Thursday. Checking labs today. The procedure, risks and benefits have been reviewed and he is willing to proceed. Smoking cessation is encouraged. Patient is agreeable to this plan and will call if any problems develop in the interim.

## 2011-07-24 NOTE — Progress Notes (Signed)
 [name] Date of Birth: 07/19/1950 Medical Record #[id number]  History of Present Illness: Mr. Nephew is seen today for a pre cath visit. He is seen for Dr. Degent. He has nonobstructive CAD with last cath that I see back in 2007. He is diabetic, has HTN, HLD, ongoing tobacco abuse and PVD. Has had prior vascular procedures with Dr. Lawson. He was referred for a stress echo that was abnormal. He has been having some atypical chest and neck pain. This all started after a TV fell on him back in November. He does have a plate in his neck as well. He does have some DOE, especially with walking to his garden. His chest pain occurs with and without exertion. No NTG use. Sugars are better and he says his A1C is down to 7 from 9.   Current Outpatient Prescriptions on File Prior to Visit  Medication Sig Dispense Refill  . aspirin 81 MG tablet Take 81 mg by mouth daily.        . atorvastatin (LIPITOR) 20 MG tablet Take 20 mg by mouth daily.      . celecoxib (CELEBREX) 200 MG capsule Take 200 mg by mouth daily.        . glipiZIDE (GLUCOTROL) 10 MG tablet Take 10 mg by mouth daily.      . hydrochlorothiazide 25 MG tablet Take 25 mg by mouth daily.        . metFORMIN (GLUCOPHAGE) 500 MG tablet Take 500 mg by mouth 2 (two) times daily with a meal.       . omeprazole (PRILOSEC) 20 MG capsule Take 40 mg by mouth daily.         No Known Allergies  Past Medical History  Diagnosis Date  . Diabetes mellitus     borderline. Now taking oral hypoglycemics  . CAD (coronary artery disease)     nonobstrtuctive by cath 2007  . PVD (peripheral vascular disease)     s/p left fem-pop bypass 2007  . Rheumatoid arthritis   . GERD (gastroesophageal reflux disease)   . Hypertension   . Hyperlipidemia   . COPD (chronic obstructive pulmonary disease)   . Leg pain   . Bronchitis   . Carotid artery occlusion   . Cervical spine disease     prior neck surgery in 2007 with Dr. Botero    Past Surgical History   Procedure Date  . Repair of left arm fracture   . 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 Surgery scrotal / testicular   . Femoral-popliteal bypass graft 11/06/2005  . Pr vein bypass graft,aorto-fem-pop 06-22-2010    Redo Left Fem-Pop    History  Smoking status  . Current Everyday Smoker -- 0.1 packs/day for 40 years  . Types: Cigarettes, Cigars  Smokeless tobacco  . Never Used  Comment:  quit smoking cigarettes last time was 08/2010    History  Alcohol Use  . Yes    occasional beer    Family History  Problem Relation Age of Onset  . Heart attack Mother 82  . Stroke Father     Review of Systems: The review of systems is per the HPI.  All other systems were reviewed and are negative.  Physical Exam: BP 130/80  Pulse 80  Ht 5' 10" (1.778 m)  Wt 175 lb 9.6 oz (79.652 kg)  BMI 25.20 kg/m2 Patient is very pleasant and in no acute distress. Skin is warm and dry. Color is normal.  HEENT is unremarkable.   Normocephalic/atraumatic. PERRL. Sclera are nonicteric. Neck is supple. No masses. No JVD. Lungs are clear. Cardiac exam shows a regular rate and rhythm. No palpable chest wall pain. Abdomen is soft. Extremities are without edema. Gait and ROM are intact. No gross neurologic deficits noted.   LABORATORY DATA:   Stress echo is reviewed from Dr. DeGent and was positive with posterior akinesis and no significant increase in overall LVEF. Findings were felt to be consistent with ischemia.    Assessment / Plan:  

## 2011-07-24 NOTE — Patient Instructions (Addendum)
We are going to arrange for a heart catheterization since your stress test was abnormal.  We need to check some labs today.   You are scheduled for an outpatient cardiac catheterization on next Friday, July 26th at 9:30 am with Dr. Clifton James or associates.  Go the Heart [facility] Vascular Center at [facility] on next Friday, July 26th at 8:30am .  Call the Heart & Vascular Center at [phone] if you are unable to make your appointment.  The code to get into the parking garage under the building is 7000. Then go to the first floor.  You must have someone available to drive you home. Someone needs to be with you for the first 24 hours after you arrive home. Please wear clothes that are easy to get on and off.  Do not eat or drink after midnight on next Thursday. You may have water only with your medications on the morning of your procedure.   May sure you take your aspirin on the day of your procedure.   Do not take the following medicines - stop the Metformin after next Thursday and do not resume until you are told to.     Directions to the Outpatient Cardiac Cath Lab at [facility]:  Please Note:  Park in [place] under the building not the parking deck.  From [employer]: Turn onto [address] Left onto [address] (1st stoplight) Right at the brick entrance to the hospital ([address]) Bear to the right and you will see a blue sign "Heart and Vascular Center" Parking garage is a sharp right, to get through the gate put in the code 7000. Once you park, take the elevator to the first floor.  Please do not arrive before 6:30 am.  The building will be dark before that time.  From [employer]: Turn onto [address] Turn left into the brick entrance to the hospital ([address] circle [address]) [place] to the right and you will see a blue sign "[facility]" Parking garage is a sharp right, to get through the gate put in the code 7000. Once you park, take the elevator to the  first floor.  Please do not arrive before 6:30 am.  The building will be dark before that time.

## 2011-08-02 ENCOUNTER — Inpatient Hospital Stay (HOSPITAL_BASED_OUTPATIENT_CLINIC_OR_DEPARTMENT_OTHER)
Admission: RE | Admit: 2011-08-02 | Discharge: 2011-08-02 | Disposition: A | Discharge status: Home / Self Care | Payer: Medicare Other | Source: Ambulatory Visit | Attending: Cardiovascular Disease | Admitting: Cardiovascular Disease

## 2011-08-02 ENCOUNTER — Encounter (HOSPITAL_BASED_OUTPATIENT_CLINIC_OR_DEPARTMENT_OTHER)
Admission: RE | Disposition: A | Discharge status: Home / Self Care | Payer: Self-pay | Source: Ambulatory Visit | Attending: Cardiovascular Disease

## 2011-08-02 DIAGNOSIS — R079 Chest pain, unspecified: Secondary | ICD-10-CM

## 2011-08-02 DIAGNOSIS — I2510 Atherosclerotic heart disease of native coronary artery without angina pectoris: Secondary | ICD-10-CM | POA: Insufficient documentation

## 2011-08-02 DIAGNOSIS — R0789 Other chest pain: Secondary | ICD-10-CM | POA: Insufficient documentation

## 2011-08-02 DIAGNOSIS — J449 Chronic obstructive pulmonary disease, unspecified: Secondary | ICD-10-CM | POA: Insufficient documentation

## 2011-08-02 DIAGNOSIS — F17200 Nicotine dependence, unspecified, uncomplicated: Secondary | ICD-10-CM | POA: Insufficient documentation

## 2011-08-02 DIAGNOSIS — K219 Gastro-esophageal reflux disease without esophagitis: Secondary | ICD-10-CM | POA: Insufficient documentation

## 2011-08-02 DIAGNOSIS — J4489 Other specified chronic obstructive pulmonary disease: Secondary | ICD-10-CM | POA: Insufficient documentation

## 2011-08-02 DIAGNOSIS — E119 Type 2 diabetes mellitus without complications: Secondary | ICD-10-CM | POA: Insufficient documentation

## 2011-08-02 DIAGNOSIS — Z01810 Encounter for preprocedural cardiovascular examination: Secondary | ICD-10-CM

## 2011-08-02 DIAGNOSIS — E785 Hyperlipidemia, unspecified: Secondary | ICD-10-CM | POA: Insufficient documentation

## 2011-08-02 DIAGNOSIS — I10 Essential (primary) hypertension: Secondary | ICD-10-CM | POA: Insufficient documentation

## 2011-08-02 SURGERY — JV LEFT HEART CATHETERIZATION WITH CORONARY ANGIOGRAM
Anesthesia: Moderate Sedation

## 2011-08-02 MED ORDER — ASPIRIN 81 MG PO CHEW
324.0000 mg | CHEWABLE_TABLET | ORAL | Status: AC
  Administered 2011-08-02: 324 mg via ORAL

## 2011-08-02 MED ORDER — SODIUM CHLORIDE 0.9 % IV SOLN: 250.0000 mL | INTRAVENOUS | Status: DC | PRN

## 2011-08-02 MED ORDER — SODIUM CHLORIDE 0.9 % IV SOLN: INTRAVENOUS | Status: AC

## 2011-08-02 MED ORDER — DIAZEPAM 5 MG PO TABS: 10.0000 mg | ORAL_TABLET | ORAL | Status: DC

## 2011-08-02 MED ORDER — SODIUM CHLORIDE 0.9 % IJ SOLN: 3.0000 mL | Freq: Two times a day (BID) | INTRAMUSCULAR | Status: DC

## 2011-08-02 MED ORDER — SODIUM CHLORIDE 0.9 % IV SOLN
INTRAVENOUS | Status: DC
  Administered 2011-08-02: 10:00:00 via INTRAVENOUS

## 2011-08-02 MED ORDER — DIAZEPAM 5 MG PO TABS
5.0000 mg | ORAL_TABLET | ORAL | Status: AC
  Administered 2011-08-02: 5 mg via ORAL

## 2011-08-02 MED ORDER — ACETAMINOPHEN 325 MG PO TABS: 650.0000 mg | ORAL_TABLET | ORAL | Status: DC | PRN

## 2011-08-02 MED ORDER — SODIUM CHLORIDE 0.9 % IJ SOLN: 3.0000 mL | INTRAMUSCULAR | Status: DC | PRN

## 2011-08-02 NOTE — H&P (View-Only) (Signed)
[name] Date of Birth: Apr 05, 1950 Medical Record #[id number]  History of Present Illness: [name] is seen today for a pre cath visit. He is seen for [name]. He has nonobstructive CAD with last cath that I see back in 2007. He is diabetic, has HTN, HLD, ongoing tobacco abuse and PVD. Has had prior vascular procedures with Dr. Hart Rochester. He was referred for a stress echo that was abnormal. He has been having some atypical chest and neck pain. This all started after a TV fell on him back in November. He does have a plate in his neck as well. He does have some DOE, especially with walking to his garden. His chest pain occurs with and without exertion. No NTG use. Sugars are better and he says his A1C is down to 7 from 9.   Current Outpatient Prescriptions on File Prior to Visit  Medication Sig Dispense Refill  . aspirin 81 MG tablet Take 81 mg by mouth daily.        Marland Kitchen atorvastatin (LIPITOR) 20 MG tablet Take 20 mg by mouth daily.      . celecoxib (CELEBREX) 200 MG capsule Take 200 mg by mouth daily.        Marland Kitchen glipiZIDE (GLUCOTROL) 10 MG tablet Take 10 mg by mouth daily.      . hydrochlorothiazide 25 MG tablet Take 25 mg by mouth daily.        . metFORMIN (GLUCOPHAGE) 500 MG tablet Take 500 mg by mouth 2 (two) times daily with a meal.       . omeprazole (PRILOSEC) 20 MG capsule Take 40 mg by mouth daily.         No Known Allergies  Past Medical History  Diagnosis Date  . Diabetes mellitus     borderline. Now taking oral hypoglycemics  . CAD (coronary artery disease)     nonobstrtuctive by cath 2007  . PVD (peripheral vascular disease)     s/p left fem-pop bypass 2007  . Rheumatoid arthritis   . GERD (gastroesophageal reflux disease)   . Hypertension   . Hyperlipidemia   . COPD (chronic obstructive pulmonary disease)   . Leg pain   . Bronchitis   . Carotid artery occlusion   . Cervical spine disease     prior neck surgery in 2007 with Dr. Jeral Fruit    Past Surgical History   Procedure Date  . Repair of left arm fracture   . 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 Surgery scrotal / testicular   . Femoral-popliteal bypass graft 11/06/2005  . Pr vein bypass graft,aorto-fem-pop 06-22-2010    Redo Left Fem-Pop    History  Smoking status  . Current Everyday Smoker -- 0.1 packs/day for 40 years  . Types: Cigarettes, Cigars  Smokeless tobacco  . Never Used  Comment:  quit smoking cigarettes last time was 08/2010    History  Alcohol Use  . Yes    occasional beer    Family History  Problem Relation Age of Onset  . Heart attack Mother 50  . Stroke Father     Review of Systems: The review of systems is per the HPI.  All other systems were reviewed and are negative.  Physical Exam: BP 130/80  Pulse 80  Ht 5\' 10"  (1.778 m)  Wt 175 lb 9.6 oz (79.652 kg)  BMI 25.20 kg/m2 Patient is very pleasant and in no acute distress. Skin is warm and dry. Color is normal.  HEENT is unremarkable.  Normocephalic/atraumatic. PERRL. Sclera are nonicteric. Neck is supple. No masses. No JVD. Lungs are clear. Cardiac exam shows a regular rate and rhythm. No palpable chest wall pain. Abdomen is soft. Extremities are without edema. Gait and ROM are intact. No gross neurologic deficits noted.   LABORATORY DATA:   Stress echo is reviewed from [name] and was positive with posterior akinesis and no significant increase in overall LVEF. Findings were felt to be consistent with ischemia.    Assessment / Plan:

## 2011-08-02 NOTE — Progress Notes (Signed)
DR Clifton James in to talk to pt and family about the results of test and plan of care. Pt able to tolerate fluids.  No problem at this time.

## 2011-08-02 NOTE — CV Procedure (Signed)
    Cardiac Catheterization Operative Report  [name] [id number] 7/26/201310:20 AM VYAS,DHRUV B., MD  Procedure Performed:  1. Left Heart Catheterization 2. Selective Coronary Angiography 3. Left ventricular angiogram  Operator: Verne Carrow, MD  Arterial access site:  Right radial artery.   Indication:   Chest pain, abnormal stress echo.                                     Procedure Details: The risks, benefits, complications, treatment options, and expected outcomes were discussed with the patient. The patient and/or family concurred with the proposed plan, giving informed consent. The patient was brought to the cath lab after IV hydration was begun and oral premedication was given. The patient was further sedated with Versed and Fentanyl. The right wrist was assessed with an Allens test which was positive. The right wrist was prepped and draped in a sterile fashion. 1% lidocaine was used for local anesthesia. Using the modified Seldinger access technique, a 5 French sheath was placed in the right radial artery. 3 mg Verapamil was given through the sheath. 4000 units IV heparin was given. Standard diagnostic catheters were used to perform selective coronary angiography. A pigtail catheter was used to perform a left ventricular angiogram. The sheath was removed from the right radial artery and a Terumo hemostasis band was applied at the arteriotomy site on the right wrist.    There were no immediate complications. The patient was taken to the recovery area in stable condition.   Hemodynamic Findings: Central aortic pressure: 123/64 Left ventricular pressure: 117/12/12  Angiographic Findings:  Left main: No disease noted.   Left Anterior Descending Artery: Large vessel that courses to the apex. There are mild luminal irregularities in the mid vessel.   Circumflex Artery: Moderate sized vessel with no obstructive disease.   Right Coronary Artery: Moderate sized,  co-dominant vessel with no obstructive disease noted.   Left Ventricular Angiogram: LVEF 45-50%.    Impression: 1. Mild non-obstructive CAD 2. Low normal LV systolic function 3. Non-cardiac chest pain  Recommendations: Continue medical management. Aggressive risk factor reduction. Smoking cessation.        Complications:  None. The patient tolerated the procedure well.

## 2011-08-02 NOTE — Interval H&P Note (Signed)
History and Physical Interval Note:  08/02/2011 9:39 AM  [name]  has presented today for surgery, with the diagnosis of chest pain  The various methods of treatment have been discussed with the patient and family. After consideration of risks, benefits and other options for treatment, the patient has consented to  Procedure(s) (LRB): JV LEFT HEART CATHETERIZATION WITH CORONARY ANGIOGRAM (N/A) as a surgical intervention .  The patient's history has been reviewed, patient examined, no change in status, stable for surgery.  I have reviewed the patient's chart and labs.  Questions were answered to the patient's satisfaction.     [name]

## 2011-08-02 NOTE — Progress Notes (Signed)
3cc of pressure removed with a total of 6cc out since inflation.  7cc left.

## 2011-08-02 NOTE — Progress Notes (Signed)
Total Pressure in TR Band was 13cc's 1100 am:  3cc of pressure removed

## 2011-08-09 LAB — POCT I-STAT GLUCOSE
Glucose, Bld: 139 mg/dL — ABNORMAL HIGH (ref 70–99)
Operator id: 221371

## 2011-08-12 ENCOUNTER — Telehealth: Payer: Self-pay | Admitting: *Deleted

## 2011-08-12 ENCOUNTER — Encounter: Payer: Self-pay | Admitting: Nurse Practitioner

## 2011-08-12 ENCOUNTER — Ambulatory Visit (INDEPENDENT_AMBULATORY_CARE_PROVIDER_SITE_OTHER): Payer: Medicare Other | Admitting: Nurse Practitioner

## 2011-08-12 VITALS — BP 130/78 | HR 75 | Ht 69.0 in | Wt 176.0 lb

## 2011-08-12 DIAGNOSIS — I10 Essential (primary) hypertension: Secondary | ICD-10-CM

## 2011-08-12 DIAGNOSIS — I2510 Atherosclerotic heart disease of native coronary artery without angina pectoris: Secondary | ICD-10-CM

## 2011-08-12 DIAGNOSIS — I519 Heart disease, unspecified: Secondary | ICD-10-CM

## 2011-08-12 DIAGNOSIS — I429 Cardiomyopathy, unspecified: Secondary | ICD-10-CM | POA: Insufficient documentation

## 2011-08-12 LAB — BASIC METABOLIC PANEL
BUN: 13 mg/dL (ref 6–23)
CO2: 30 mEq/L (ref 19–32)
Calcium: 8.9 mg/dL (ref 8.4–10.5)
Chloride: 104 mEq/L (ref 96–112)
Creatinine, Ser: 0.9 mg/dL (ref 0.4–1.5)
GFR: 113.36 mL/min (ref >60.00)
Glucose, Bld: 179 mg/dL — ABNORMAL HIGH (ref 70–99)
Potassium: 4.3 mEq/L (ref 3.5–5.1)
Sodium: 140 mEq/L (ref 135–145)

## 2011-08-12 MED ORDER — LISINOPRIL 5 MG PO TABS: 5.0000 mg | ORAL_TABLET | Freq: Every day | ORAL | Status: DC

## 2011-08-12 NOTE — Assessment & Plan Note (Signed)
He has some mild LV dysfunction. Will stop his HCTZ and try him on low dose ACE. I have asked him to check some blood pressures at home since it sounds like he has had some orthostasis. I will see him in a month. Check BMET today and on return. Patient is agreeable to this plan and will call if any problems develop in the interim.

## 2011-08-12 NOTE — Progress Notes (Signed)
[name] Date of Birth: 1950/11/19 Medical Record #[id number]  History of Present Illness: Harbert is seen today for a post cath visit. He is seen for Dr. Andee Lineman. He has Dm, HTN, HLD, ongoing tobacco abuse and PVD. Has had a recent abnormal stress echo and has had recent cath showing mild nonobstructive CAD, low normal LV systolic function at 45 to 50%. His prior echo showed 50%. Marland Kitchen He will be managed medically. Smoking cessation is encouraged.   He comes in today. He is here alone. He is doing well. No problems post cath. His arm is ok. He is not having chest pain. He notes that he has had some "weak spells" and that when he checked his BP it was around 90 systolic. He is on just HCTZ. Does like salt. No swelling. Not short of breath. Still smoking some and we discussed the need for cessation.   Current Outpatient Prescriptions on File Prior to Visit  Medication Sig Dispense Refill  . aspirin 81 MG tablet Take 81 mg by mouth daily.        Marland Kitchen atorvastatin (LIPITOR) 20 MG tablet Take 20 mg by mouth daily.      . celecoxib (CELEBREX) 200 MG capsule Take 200 mg by mouth daily.        Marland Kitchen glipiZIDE (GLUCOTROL) 10 MG tablet Take 10 mg by mouth daily.      Marland Kitchen omeprazole (PRILOSEC) 20 MG capsule Take 40 mg by mouth daily.       Marland Kitchen lisinopril (PRINIVIL,ZESTRIL) 5 MG tablet Take 1 tablet (5 mg total) by mouth daily.  30 tablet  11    No Known Allergies  Past Medical History  Diagnosis Date  . Diabetes mellitus     borderline. Now taking oral hypoglycemics  . CAD (coronary artery disease)     nonobstrtuctive by cath 2007 & 2013  . PVD (peripheral vascular disease)     s/p left fem-pop bypass 2007  . Rheumatoid arthritis   . GERD (gastroesophageal reflux disease)   . Hypertension   . Hyperlipidemia   . COPD (chronic obstructive pulmonary disease)   . Leg pain   . Bronchitis   . Carotid artery occlusion   . Cervical spine disease     prior neck surgery in 2007 with Dr. Jeral Fruit    Past  Surgical History  Procedure Date  . Repair of left arm fracture   . 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 Surgery scrotal / testicular   . Femoral-popliteal bypass graft 11/06/2005  . Pr vein bypass graft,aorto-fem-pop 06-22-2010    Redo Left Fem-Pop    History  Smoking status  . Current Everyday Smoker -- 0.1 packs/day for 40 years  . Types: Cigarettes, Cigars  Smokeless tobacco  . Never Used  Comment:  quit smoking cigarettes last time was 08/2010    History  Alcohol Use  . Yes    occasional beer    Family History  Problem Relation Age of Onset  . Heart attack Mother 53  . Stroke Father     Review of Systems: The review of systems is per the HPI.  All other systems were reviewed and are negative.  Physical Exam: BP 130/78  Pulse 75  Ht 5\' 9"  (1.753 m)  Wt 176 lb (79.833 kg)  BMI 25.99 kg/m2  SpO2 95% Patient is very pleasant and in no acute distress. Skin is warm and dry. Color is normal.  HEENT is unremarkable. Normocephalic/atraumatic. PERRL. Sclera are nonicteric. Neck  is supple. No masses. No JVD. Lungs are clear. Cardiac exam shows a regular rate and rhythm. Abdomen is soft. Extremities are without edema. Gait and ROM are intact. No gross neurologic deficits noted.   LABORATORY DATA: BMET is pending for today.    Assessment / Plan:

## 2011-08-12 NOTE — Telephone Encounter (Signed)
Message copied by Awilda Bill on Mon Aug 12, 2011  1:47 PM ------      Message from: Rosalio Macadamia      Created: Mon Aug 12, 2011 12:59 PM       Ok to report. Labs are satisfactory except for glucose. Will recheck on return visit since we started ACE today

## 2011-08-12 NOTE — Patient Instructions (Addendum)
Stay on your current medicines except stop the HCTZ  We are going to try Lisinopril 5 mg a day. This is for your blood pressure and the weakness of your heart muscle  We are going to check lab today  I will see you in a month.  Cut back on your salt. Work on your smoking. Keep a record of your blood pressures for me.  Call the [facility] office at [phone] if you have any questions, problems or concerns.

## 2011-08-12 NOTE — Assessment & Plan Note (Signed)
His cath showed mild nonobstructive CAD. Will manage medically with risk factor modification. He is encouraged to stop smoking.

## 2011-08-12 NOTE — Telephone Encounter (Signed)
Notified patient of lab results.  Patient will come in fasting for labs 09/12/2011 same day as OV with Lawson Fiscal.  Vista Mink, CMA

## 2011-09-12 ENCOUNTER — Encounter: Payer: Self-pay | Admitting: Nurse Practitioner

## 2011-09-12 ENCOUNTER — Ambulatory Visit (INDEPENDENT_AMBULATORY_CARE_PROVIDER_SITE_OTHER): Payer: Medicare Other | Admitting: Nurse Practitioner

## 2011-09-12 VITALS — BP 128/80 | HR 72 | Ht 70.0 in | Wt 179.0 lb

## 2011-09-12 DIAGNOSIS — I519 Heart disease, unspecified: Secondary | ICD-10-CM

## 2011-09-12 DIAGNOSIS — I25810 Atherosclerosis of coronary artery bypass graft(s) without angina pectoris: Secondary | ICD-10-CM

## 2011-09-12 LAB — BASIC METABOLIC PANEL
BUN: 12 mg/dL (ref 6–23)
CO2: 28 mEq/L (ref 19–32)
Calcium: 9 mg/dL (ref 8.4–10.5)
Chloride: 104 mEq/L (ref 96–112)
Creatinine, Ser: 0.8 mg/dL (ref 0.4–1.5)
GFR: 124.71 mL/min (ref >60.00)
Glucose, Bld: 137 mg/dL — ABNORMAL HIGH (ref 70–99)
Potassium: 4.2 mEq/L (ref 3.5–5.1)
Sodium: 138 mEq/L (ref 135–145)

## 2011-09-12 NOTE — Progress Notes (Signed)
[name] Date of Birth: 02-25-1950 Medical Record #[id number]  History of Present Illness: [name] is seen today for a one month visit. He is a former patient of Dr. Margarita Mail. He has Dm, HTN, HLD, ongoing tobacco abuse and PVD. Has had a recent abnormal stress echo and has had recent cath showing mild nonobstructive CAD, low normal LV systolic function at 45 to 50%. His prior echo showed 50%. Marland Kitchen He will be managed medically. Smoking cessation is encouraged. At his last visit, we started low dose ACE. Smoking cessation was encouraged.   He comes in today. He is here alone. He is doing well. Feels good. Has some sternal discomfort that he notes with moving/twisting and palpation. Has lots of arthritis. He brings in his list of blood pressures which are very satisfactory. He is not having angina. No shortness of breath. Feels good on his current regimen. Trying to stay active. He has cut down on his smoking.  Current Outpatient Prescriptions on File Prior to Visit  Medication Sig Dispense Refill  . aspirin 81 MG tablet Take 81 mg by mouth daily.        Marland Kitchen atorvastatin (LIPITOR) 20 MG tablet Take 20 mg by mouth daily.      . celecoxib (CELEBREX) 200 MG capsule Take 200 mg by mouth daily.        Marland Kitchen glipiZIDE (GLUCOTROL) 10 MG tablet Take 10 mg by mouth daily.      Marland Kitchen lisinopril (PRINIVIL,ZESTRIL) 5 MG tablet Take 1 tablet (5 mg total) by mouth daily.  30 tablet  11  . metFORMIN (GLUCOPHAGE) 500 MG tablet Take 500 mg by mouth 2 (two) times daily with a meal.      . omeprazole (PRILOSEC) 20 MG capsule Take 40 mg by mouth daily.         No Known Allergies  Past Medical History  Diagnosis Date  . Diabetes mellitus     borderline. Now taking oral hypoglycemics  . CAD (coronary artery disease)     nonobstrtuctive by cath 2007 & 2013  . PVD (peripheral vascular disease)     s/p left fem-pop bypass 2007  . Rheumatoid arthritis   . GERD (gastroesophageal reflux disease)   . Hypertension   .  Hyperlipidemia   . COPD (chronic obstructive pulmonary disease)   . Leg pain   . Bronchitis   . Carotid artery occlusion   . Cervical spine disease     prior neck surgery in 2007 with Dr. Jeral Fruit    Past Surgical History  Procedure Date  . Repair of left arm fracture   . 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 Surgery scrotal / testicular   . Femoral-popliteal bypass graft 11/06/2005  . Pr vein bypass graft,aorto-fem-pop 06-22-2010    Redo Left Fem-Pop    History  Smoking status  . Current Everyday Smoker -- 0.1 packs/day for 40 years  . Types: Cigarettes, Cigars  Smokeless tobacco  . Never Used  Comment:  quit smoking cigarettes last time was 08/2010    History  Alcohol Use  . Yes    occasional beer    Family History  Problem Relation Age of Onset  . Heart attack Mother 79  . Stroke Father     Review of Systems: The review of systems is per the HPI.  All other systems were reviewed and are negative.  Physical Exam: BP 128/80  Pulse 72  Ht 5\' 10"  (1.778 m)  Wt 179 lb (81.194 kg)  BMI 25.68 kg/m2 Patient is very pleasant and in no acute distress. Skin is warm and dry. Color is normal.  HEENT is unremarkable. Normocephalic/atraumatic. PERRL. Sclera are nonicteric. Neck is supple. No masses. No JVD. Lungs are clear. Cardiac exam shows a regular rate and rhythm. Abdomen is soft. Extremities are without edema. Gait and ROM are intact. No gross neurologic deficits noted.   LABORATORY DATA: BMET is pending for today  Lab Results  Component Value Date   WBC 9.1 07/24/2011   HGB 14.3 07/24/2011   HCT 43.8 07/24/2011   PLT 214.0 07/24/2011   GLUCOSE 179* 08/12/2011   ALT 21 06/20/2010   AST 19 06/20/2010   NA 140 08/12/2011   K 4.3 08/12/2011   CL 104 08/12/2011   CREATININE 0.9 08/12/2011   BUN 13 08/12/2011   CO2 30 08/12/2011   INR 1.1* 07/24/2011     Assessment / Plan:  1. CAD - He is doing well with medical therapy.   2. Mild LV dysfunction - tolerating low dose ACE. BP looks  ok. Will check BMET today  3. Tobacco abuse - he has cut way back on his smoking. He is encouraged. He says he will be "smoke free" when he comes back.  Overall, he is felt to be doing very well. Will check a BMET today. He would like to follow up with me in 4 months. Will check fasting labs at that visit as well.   Patient is agreeable to this plan and will call if any problems develop in the interim.

## 2011-09-12 NOTE — Patient Instructions (Addendum)
Continue with your current medicines  Try to keep work on your smoking  We will see you back here in 4 months with fasting labs  Continue to monitor your blood pressure at home  Call the [facility] office at [phone] if you have any questions, problems or concerns.

## 2011-09-15 NOTE — Assessment & Plan Note (Signed)
Patient reports of increased shortness of breath although no chest pain.  He still smoking with multiple cardiac risk factors.  He'll be referred for an echocardiogram and dobutamine echocardiogram.

## 2011-09-15 NOTE — Progress Notes (Signed)
Brett Bottoms, MD, [facility] ABIM Board Certified in Adult Cardiovascular Medicine,Internal Medicine and Critical Care Medicine    CC:   followup patient with coronary artery disease and peripheral vascular disease                                                                               HPI:        The patient is a 61 year old male with history of peripheral vascular disease and coronary artery disease.  He has multiple risk factors.  He has a history of questionable small CVA in the past.  He continued to complain of left leg pain but is followed by bypass surgery.  He has no chest pain either at rest or exertion  But does become short of breath when walking up a flight of stairs.  He also had recent bronchitis and was treated by primary care physician.  Last catheterization was in 2010.  The patient denies any orthopnea or PND.  He denies any palpitations or syncope.  PMH: reviewed and listed in Problem List in Electronic Records (and see below) Past Medical History  Diagnosis Date  . Diabetes mellitus     borderline. Now taking oral hypoglycemics  . CAD (coronary artery disease)     nonobstrtuctive by cath 2007 & 2013  . PVD (peripheral vascular disease)     s/p left fem-pop bypass 2007  . Rheumatoid arthritis   . GERD (gastroesophageal reflux disease)   . Hypertension   . Hyperlipidemia   . COPD (chronic obstructive pulmonary disease)   . Leg pain   . Bronchitis   . Carotid artery occlusion   . Cervical spine disease     prior neck surgery in 2007 with Dr. Jeral Fruit   Past Surgical History  Procedure Date  . Repair of left arm fracture   . 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 Surgery scrotal / testicular   . Femoral-popliteal bypass graft 11/06/2005  . Pr vein bypass graft,aorto-fem-pop 06-22-2010    Redo Left Fem-Pop    Allergies/SH/FHX : available in Electronic Records for review  No Known Allergies History   Social History  . Marital Status: Single    Spouse Name: N/A      Number of Children: N/A  . Years of Education: N/A   Occupational History  . Not on file.   Social History Main Topics  . Smoking status: Current Everyday Smoker -- 0.1 packs/day for 40 years    Types: Cigarettes, Cigars  . Smokeless tobacco: Never Used   Comment:  quit smoking cigarettes last time was 08/2010  . Alcohol Use: Yes     occasional beer  . Drug Use: No  . Sexually Active: Not Currently   Other Topics Concern  . Not on file   Social History Narrative   The patient lives in [place].    Family History  Problem Relation Age of Onset  . Heart attack Mother 58  . Stroke Father     Medications: Current Outpatient Prescriptions  Medication Sig Dispense Refill  . aspirin 81 MG tablet Take 81 mg by mouth daily.        Marland Kitchen atorvastatin (LIPITOR) 20 MG  tablet Take 20 mg by mouth daily.      . celecoxib (CELEBREX) 200 MG capsule Take 200 mg by mouth daily.        Marland Kitchen glipiZIDE (GLUCOTROL) 10 MG tablet Take 10 mg by mouth daily.      Marland Kitchen omeprazole (PRILOSEC) 20 MG capsule Take 40 mg by mouth daily.       Marland Kitchen lisinopril (PRINIVIL,ZESTRIL) 5 MG tablet Take 1 tablet (5 mg total) by mouth daily.  30 tablet  11  . metFORMIN (GLUCOPHAGE) 500 MG tablet Take 500 mg by mouth 2 (two) times daily with a meal.        ROS: No nausea or vomiting. No fever or chills.No melena or hematochezia.No bleeding.No claudication  Physical Exam: BP 123/81  Pulse 89  Ht 5\' 10"  (1.778 m)  Wt 179 lb (81.194 kg)  BMI 25.68 kg/m2  SpO2 95% General: well-nourished white male in no distress Neck:normal carotid upstroke and no carotid bruits no thyromegaly Lungs:the breath sounds bilaterallyclear Cardiac:regular rate and rhythm.  Normal S1 and S2 Vascular:no edema.  Unable to palpate pulses distally Skin:warm and dry Physcologic:normal affect  12lead ECG:not obtained Limited bedside ECHO:N/A No images are attached to the encounter.   I reviewed and summarized the old records. I reviewed ECG and  prior blood work.  Assessment and Plan  Atherosclerosis of native arteries of the extremities with intermittent claudication followup with Dr. Hart Rochester.  CAD Patient reports of increased shortness of breath although no chest pain.  He still smoking with multiple cardiac risk factors.  He'll be referred for an echocardiogram and dobutamine echocardiogram.  History of arterial bypass of lower limb Status post left femoropopliteal 2007  Dr. Hart Rochester is status post redo left femoropopliteal followup by vascular surgery.  LV dysfunction Repeat followup echocardiogram    Patient Active Problem List  Diagnosis  . CAD  . PERIPHERAL VASCULAR DISEASE  . CHRONIC OBSTRUCTIVE PULMONARY DISEASE  . GASTROESOPHAGEAL REFLUX DISEASE  . RHEUMATOID ARTHRITIS  . DIABETES MELLITUS, BORDERLINE  . History of arterial bypass of lower limb  . Atherosclerosis of native arteries of the extremities with intermittent claudication  . LV dysfunction

## 2011-09-15 NOTE — Assessment & Plan Note (Signed)
followup with Dr. Hart Rochester.

## 2011-09-15 NOTE — Assessment & Plan Note (Signed)
Repeat followup echocardiogram

## 2011-09-15 NOTE — Assessment & Plan Note (Signed)
Status post left femoropopliteal 2007  Dr. Hart Rochester is status post redo left femoropopliteal followup by vascular surgery.

## 2011-09-16 ENCOUNTER — Telehealth: Payer: Self-pay | Admitting: *Deleted

## 2011-09-16 NOTE — Telephone Encounter (Signed)
Pt is aware of results.  Vista Mink, CMA

## 2011-09-16 NOTE — Telephone Encounter (Signed)
Message copied by Awilda Bill on Mon Sep 16, 2011  3:59 PM ------      Message from: Rosalio Macadamia      Created: Fri Sep 13, 2011  6:48 AM       Ok to report. Labs are satisfactory.

## 2011-09-17 ENCOUNTER — Telehealth: Payer: Self-pay

## 2011-09-17 DIAGNOSIS — I739 Peripheral vascular disease, unspecified: Secondary | ICD-10-CM

## 2011-09-17 NOTE — Telephone Encounter (Signed)
Phone call from pt.   C/o onset of pain left thigh and behind (L) knee about 1 week ago.  States the pain can occur at night and also, with activity, during the day.  States the pain is noticeable with walking uphill, also.  Questioned about any change in sensation of left leg.  States he has some numbness around left knee, that has been there, prior to the onset of pain one week ago.  Discussed w/ Dr. Arbie Cookey.  Rec'd v.o. to check ABI's, and only if ABI's are abnormal, then he should have a (L) lower extremity arterial duplex.

## 2011-09-18 NOTE — Telephone Encounter (Signed)
As per [name]'s pone message I scheduled an appt for this patient to come in on Tues 09/24/11 at 2:30pm for vascular lab studies and to see JDL. He is aware of the appt time/date. awt

## 2011-09-23 ENCOUNTER — Encounter: Payer: Self-pay | Admitting: Vascular Surgery

## 2011-09-24 ENCOUNTER — Other Ambulatory Visit: Payer: Self-pay

## 2011-09-24 ENCOUNTER — Ambulatory Visit (INDEPENDENT_AMBULATORY_CARE_PROVIDER_SITE_OTHER): Payer: Medicare Other | Admitting: Vascular Surgery

## 2011-09-24 ENCOUNTER — Encounter (INDEPENDENT_AMBULATORY_CARE_PROVIDER_SITE_OTHER): Payer: Medicare Other | Admitting: *Deleted

## 2011-09-24 ENCOUNTER — Encounter: Payer: Self-pay | Admitting: Vascular Surgery

## 2011-09-24 VITALS — BP 137/94 | HR 56 | Resp 16 | Ht 70.0 in | Wt 180.0 lb

## 2011-09-24 DIAGNOSIS — Z48812 Encounter for surgical aftercare following surgery on the circulatory system: Secondary | ICD-10-CM

## 2011-09-24 DIAGNOSIS — I739 Peripheral vascular disease, unspecified: Secondary | ICD-10-CM

## 2011-09-24 NOTE — Progress Notes (Signed)
Subjective:     Patient ID: [name], male   DOB: 10/12/1950, 60 y.o.   MRN: [id number]  HPI this 60year-old male had a redo left femoral-popliteal bypass graft performed by me in June of 2012. He continues to have no claudication in the left leg. He did have some pain behind the left calf which she was concerned about but that has resolved. He does have severe right buttock and thigh claudication which is worse than it was 6 months ago and limiting him. He has no rest pain or history of nonhealing ulcer   Past Medical History  Diagnosis Date  . Diabetes mellitus     borderline. Now taking oral hypoglycemics  . CAD (coronary artery disease)     nonobstrtuctive by cath 2007 & 2013  . PVD (peripheral vascular disease)     s/p left fem-pop bypass 2007  . Rheumatoid arthritis   . GERD (gastroesophageal reflux disease)   . Hypertension   . Hyperlipidemia   . COPD (chronic obstructive pulmonary disease)   . Leg pain   . Bronchitis   . Carotid artery occlusion   . Cervical spine disease     prior neck surgery in 2007 with Dr. Botero    History  Substance Use Topics  . Smoking status: Current Some Day Smoker -- 0.1 packs/day for 40 years    Types: Cigarettes, Cigars  . Smokeless tobacco: Never Used   Comment: pt states that he only smokes when he with his friends on the weekend, states that he does not plan to smoke after today  . Alcohol Use: Yes     occasional beer    Family History  Problem Relation Age of Onset  . Heart attack Mother 82  . Stroke Father     No Known Allergies  Current outpatient prescriptions:aspirin 81 MG tablet, Take 81 mg by mouth daily.  , Disp: , Rfl: ;  atorvastatin (LIPITOR) 20 MG tablet, Take 20 mg by mouth daily., Disp: , Rfl: ;  celecoxib (CELEBREX) 200 MG capsule, Take 200 mg by mouth daily.  , Disp: , Rfl: ;  glipiZIDE (GLUCOTROL) 10 MG tablet, Take 10 mg by mouth daily., Disp: , Rfl:  lisinopril (PRINIVIL,ZESTRIL) 5 MG tablet, Take 1 tablet  (5 mg total) by mouth daily., Disp: 30 tablet, Rfl: 11;  metFORMIN (GLUCOPHAGE) 500 MG tablet, Take 500 mg by mouth 2 (two) times daily with a meal., Disp: , Rfl: ;  omeprazole (PRILOSEC) 20 MG capsule, Take 40 mg by mouth daily. , Disp: , Rfl:   BP 137/94  Pulse 56  Resp 16  Ht 5' 10" (1.778 m)  Wt 180 lb (81.647 kg)  BMI 25.83 kg/m2  SpO2 98%  Body mass index is 25.83 kg/(m^2).          Review of Systems denies chest pain, dyspnea on exertion, PND, orthopnea, hemoptysis      Objective:   Physical Exam blood pressure 137/94 heart rate 86 respirations 16 General well-developed well-nourished male no apparent stress alert and oriented x3 Left leg 3+ femoral popliteal pulse palpable. Right leg with 1+ femoral pulse palpable. No distal pulses palpable right.  Today I ordered duplex scan of left femoral-popliteal bypass and bilateral ABIs. Duplex scan of the left reveals the widely patent femoral popliteal graft with ABI of 1.0 distally. Right leg ABI is only 0.77 with monophasic flow.    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 Chen on Thursday, September 26      

## 2011-09-25 ENCOUNTER — Encounter (HOSPITAL_COMMUNITY): Payer: Self-pay | Admitting: Pharmacy Technician

## 2011-10-02 MED ORDER — SODIUM CHLORIDE 0.9 % IV SOLN
INTRAVENOUS | Status: DC
  Administered 2011-10-03: 07:00:00 via INTRAVENOUS

## 2011-10-03 ENCOUNTER — Other Ambulatory Visit: Payer: Self-pay | Admitting: *Deleted

## 2011-10-03 ENCOUNTER — Telehealth: Payer: Self-pay | Admitting: Vascular Surgery

## 2011-10-03 ENCOUNTER — Ambulatory Visit (HOSPITAL_COMMUNITY)
Admission: RE | Admit: 2011-10-03 | Discharge: 2011-10-03 | Disposition: A | Discharge status: Home / Self Care | Payer: Medicare Other | Source: Ambulatory Visit | Attending: Vascular Surgery | Admitting: Vascular Surgery

## 2011-10-03 ENCOUNTER — Encounter (HOSPITAL_COMMUNITY)
Admission: RE | Disposition: A | Discharge status: Home / Self Care | Payer: Self-pay | Source: Ambulatory Visit | Attending: Vascular Surgery

## 2011-10-03 DIAGNOSIS — E785 Hyperlipidemia, unspecified: Secondary | ICD-10-CM | POA: Insufficient documentation

## 2011-10-03 DIAGNOSIS — I10 Essential (primary) hypertension: Secondary | ICD-10-CM | POA: Insufficient documentation

## 2011-10-03 DIAGNOSIS — I739 Peripheral vascular disease, unspecified: Secondary | ICD-10-CM

## 2011-10-03 DIAGNOSIS — R7309 Other abnormal glucose: Secondary | ICD-10-CM | POA: Insufficient documentation

## 2011-10-03 DIAGNOSIS — Z48812 Encounter for surgical aftercare following surgery on the circulatory system: Secondary | ICD-10-CM

## 2011-10-03 DIAGNOSIS — I2510 Atherosclerotic heart disease of native coronary artery without angina pectoris: Secondary | ICD-10-CM | POA: Insufficient documentation

## 2011-10-03 DIAGNOSIS — K219 Gastro-esophageal reflux disease without esophagitis: Secondary | ICD-10-CM | POA: Insufficient documentation

## 2011-10-03 DIAGNOSIS — J449 Chronic obstructive pulmonary disease, unspecified: Secondary | ICD-10-CM | POA: Insufficient documentation

## 2011-10-03 DIAGNOSIS — J4489 Other specified chronic obstructive pulmonary disease: Secondary | ICD-10-CM | POA: Insufficient documentation

## 2011-10-03 DIAGNOSIS — I70209 Unspecified atherosclerosis of native arteries of extremities, unspecified extremity: Secondary | ICD-10-CM | POA: Insufficient documentation

## 2011-10-03 HISTORY — PX: ILIAC ARTERY STENT: SHX1786

## 2011-10-03 HISTORY — PX: ABDOMINAL AORTAGRAM: SHX5454

## 2011-10-03 LAB — POCT I-STAT, CHEM 8
BUN: 20 mg/dL (ref 6–23)
Calcium, Ion: 1.17 mmol/L (ref 1.13–1.30)
Chloride: 104 mEq/L (ref 96–112)
Creatinine, Ser: 0.9 mg/dL (ref 0.50–1.35)
Glucose, Bld: 173 mg/dL — ABNORMAL HIGH (ref 70–99)
HCT: 44 % (ref 39.0–52.0)
Hemoglobin: 15 g/dL (ref 13.0–17.0)
Potassium: 4.6 mEq/L (ref 3.5–5.1)
Sodium: 141 mEq/L (ref 135–145)
TCO2: 26 mmol/L (ref 0–100)

## 2011-10-03 LAB — GLUCOSE, CAPILLARY: Glucose-Capillary: 119 mg/dL — ABNORMAL HIGH (ref 70–99)

## 2011-10-03 LAB — POCT ACTIVATED CLOTTING TIME: Activated Clotting Time: 185 seconds

## 2011-10-03 SURGERY — ABDOMINAL AORTAGRAM
Anesthesia: LOCAL | Laterality: Right

## 2011-10-03 MED ORDER — MORPHINE SULFATE 4 MG/ML IJ SOLN: 2.0000 mg | INTRAMUSCULAR | Status: DC | PRN

## 2011-10-03 MED ORDER — LIDOCAINE HCL (PF) 1 % IJ SOLN
INTRAMUSCULAR | Status: AC
  Filled 2011-10-03: qty 30

## 2011-10-03 MED ORDER — FENTANYL CITRATE 0.05 MG/ML IJ SOLN
INTRAMUSCULAR | Status: AC
  Filled 2011-10-03: qty 2

## 2011-10-03 MED ORDER — ACETAMINOPHEN 325 MG PO TABS: 650.0000 mg | ORAL_TABLET | ORAL | Status: DC | PRN

## 2011-10-03 MED ORDER — HEPARIN (PORCINE) IN NACL 2-0.9 UNIT/ML-% IJ SOLN
INTRAMUSCULAR | Status: AC
  Filled 2011-10-03: qty 1000

## 2011-10-03 MED ORDER — HEPARIN SODIUM (PORCINE) 1000 UNIT/ML IJ SOLN
INTRAMUSCULAR | Status: AC
  Filled 2011-10-03: qty 1

## 2011-10-03 MED ORDER — ONDANSETRON HCL 4 MG/2ML IJ SOLN: 4.0000 mg | Freq: Four times a day (QID) | INTRAMUSCULAR | Status: DC | PRN

## 2011-10-03 MED ORDER — CLOPIDOGREL BISULFATE 300 MG PO TABS
300.0000 mg | ORAL_TABLET | Freq: Once | ORAL | Status: AC
  Administered 2011-10-03: 300 mg via ORAL
  Filled 2011-10-03: qty 1

## 2011-10-03 MED ORDER — SODIUM CHLORIDE 0.9 % IV SOLN: 1.0000 mL/kg/h | INTRAVENOUS | Status: DC

## 2011-10-03 MED ORDER — OXYCODONE-ACETAMINOPHEN 5-325 MG PO TABS: 1.0000 | ORAL_TABLET | ORAL | Status: DC | PRN

## 2011-10-03 MED ORDER — CLOPIDOGREL BISULFATE 75 MG PO TABS: 75.0000 mg | ORAL_TABLET | Freq: Every day | ORAL | Status: DC

## 2011-10-03 MED ORDER — MIDAZOLAM HCL 2 MG/2ML IJ SOLN
INTRAMUSCULAR | Status: AC
  Filled 2011-10-03: qty 2

## 2011-10-03 NOTE — Telephone Encounter (Signed)
Message copied by Margaretmary Eddy on Thu Oct 03, 2011  3:21 PM ------      Message from: Melene Plan      Created: Thu Oct 03, 2011  2:01 PM                   ----- Message -----         From: Fransisco Hertz, MD         Sent: 10/03/2011  12:50 PM           To: Reuel Derby, Melene Plan, RN            Heraldo Mikulak      [id number]      [birth year]            PROCEDURE:      1.  Right common femoral artery cannulation under ultrasound guidance      2.  Aortogram      3.  Right subintimal angioplasty and stenting right common iliac artery        4.  Bilateral leg runoff            Follow-up: 4 weeks            Orders(s) for follow-up: BLE ABI

## 2011-10-03 NOTE — H&P (View-Only) (Signed)
Subjective:     Patient ID: [name], male   DOB: 05-07-1950, 61 y.o.   MRN: [id number]  HPI this 61year-old male had a redo left femoral-popliteal bypass graft performed by me in June of 2012. He continues to have no claudication in the left leg. He did have some pain behind the left calf which she was concerned about but that has resolved. He does have severe right buttock and thigh claudication which is worse than it was 6 months ago and limiting him. He has no rest pain or history of nonhealing ulcer   Past Medical History  Diagnosis Date  . Diabetes mellitus     borderline. Now taking oral hypoglycemics  . CAD (coronary artery disease)     nonobstrtuctive by cath 2007 & 2013  . PVD (peripheral vascular disease)     s/p left fem-pop bypass 2007  . Rheumatoid arthritis   . GERD (gastroesophageal reflux disease)   . Hypertension   . Hyperlipidemia   . COPD (chronic obstructive pulmonary disease)   . Leg pain   . Bronchitis   . Carotid artery occlusion   . Cervical spine disease     prior neck surgery in 2007 with Dr. Jeral Fruit    History  Substance Use Topics  . Smoking status: Current Some Day Smoker -- 0.1 packs/day for 40 years    Types: Cigarettes, Cigars  . Smokeless tobacco: Never Used   Comment: pt states that he only smokes when he with his friends on the weekend, states that he does not plan to smoke after today  . Alcohol Use: Yes     occasional beer    Family History  Problem Relation Age of Onset  . Heart attack Mother 62  . Stroke Father     No Known Allergies  Current outpatient prescriptions:aspirin 81 MG tablet, Take 81 mg by mouth daily.  , Disp: , Rfl: ;  atorvastatin (LIPITOR) 20 MG tablet, Take 20 mg by mouth daily., Disp: , Rfl: ;  celecoxib (CELEBREX) 200 MG capsule, Take 200 mg by mouth daily.  , Disp: , Rfl: ;  glipiZIDE (GLUCOTROL) 10 MG tablet, Take 10 mg by mouth daily., Disp: , Rfl:  lisinopril (PRINIVIL,ZESTRIL) 5 MG tablet, Take 1 tablet  (5 mg total) by mouth daily., Disp: 30 tablet, Rfl: 11;  metFORMIN (GLUCOPHAGE) 500 MG tablet, Take 500 mg by mouth 2 (two) times daily with a meal., Disp: , Rfl: ;  omeprazole (PRILOSEC) 20 MG capsule, Take 40 mg by mouth daily. , Disp: , Rfl:   BP 137/94  Pulse 56  Resp 16  Ht 5\' 10"  (1.778 m)  Wt 180 lb (81.647 kg)  BMI 25.83 kg/m2  SpO2 98%  Body mass index is 25.83 kg/(m^2).          Review of Systems denies chest pain, dyspnea on exertion, PND, orthopnea, hemoptysis      Objective:   Physical Exam blood pressure 137/94 heart rate 86 respirations 16 General well-developed well-nourished male no apparent stress alert and oriented x3 Left leg 3+ femoral popliteal pulse palpable. Right leg with 1+ femoral pulse palpable. No distal pulses palpable right.  Today I ordered duplex scan of left femoral-popliteal bypass and bilateral ABIs. Duplex scan of the left reveals the widely patent femoral popliteal graft with ABI of 1.0 distally. Right leg ABI is only 0.77 with monophasic flow.    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 Imogene Burn on Thursday, September 26

## 2011-10-03 NOTE — Interval H&P Note (Signed)
[facility] [place]  History and Physical Update  The patient was interviewed and re-examined.  The patient's previous History and Physical has been reviewed and is unchanged from Dr. Candie Chroman clinic note.  There is no change in the plan of care: Aortogram, bilateral runoff, and possible intervention.  Leonides Sake, MD [facility] [place] Office: [phone] Pager: [phone]  10/03/2011, 7:30 AM

## 2011-10-03 NOTE — Op Note (Signed)
OPERATIVE NOTE   PROCEDURE: 1.  Right common femoral artery cannulation under ultrasound guidance 2.  Aortogram 3.  Right subintimal angioplasty and stenting right common iliac artery  4.  Bilateral leg runoff  PRE-OPERATIVE DIAGNOSIS: Right iliac artery occlusive disease  POST-OPERATIVE DIAGNOSIS: same as above   SURGEON: Leonides Sake, MD  ANESTHESIA: conscious sedation  ESTIMATED BLOOD LOSS: 50 cc  CONTRAST: 200 cc  FINDING(S):  Aorta: patent  Superior mesenteric artery: patent Celiac artery: patent  Right Left  RA Patent Patent  CIA Occluded for 3 cm: resolved after angioplasty and stenting Patent  EIA Patent Patent  IIA Patent Patent  CFA Patent Patent  SFA Patent Patent left common femoral artery to above-the-knee popliteal bypass  PFA Patent Patent  Pop Patent Patent  Trif Patent Patent  AT Patent, runoff to foot Occludes shortly after takeoff  Pero Patent, runoff to foot Patent, only runoff to foot  PT Patent proximally, occludes in mid-segment Occludes shortly after takeoff     SPECIMEN(S):  none  INDICATIONS:   [name] is a 61 y.o. male who presents with likely right iliac artery occlusive disease.  The patient presents for: aortogram, bilateral leg runoff, and possible intervention.  I discussed with the patient the nature of angiographic procedures, especially the limited patencies of any endovascular intervention.  The patient is aware of that the risks of an angiographic procedure include but are not limited to: bleeding, infection, access site complications, renal failure, embolization, rupture of vessel, dissection, possible need for emergent surgical intervention, possible need for surgical procedures to treat the patient's pathology, and stroke and death.  The patient is aware of the risks and agrees to proceed.  DESCRIPTION: After full informed consent was obtained from the patient, the patient was brought back to the angiography suite.  The  patient was placed supine upon the angiography table and connected to monitoring equipment.  The patient was then given conscious sedation, the amounts of which are documented in the patient's chart.  The patient was prepped and drape in the standard fashion for an angiographic procedure.  At this point, attention was turned to the right groin.  Under ultrasound guidance, the right common femoral artery will be cannulated with a 18 gauge needle.  The [facility] wire was passed up into the common iliac artery where it curled up.  The needle was exchanged for a 5-Fr sheath, which was advanced over the wire into the common femoral artery.  The dilator was then removed.  I did a hand injection which demonstrated a chronic total occlusion of the right common iliac artery proximal to the iliac bifurcation.  The wire was exchanged for a glidewire and a KMP catheter was loaded over the wire.  Using this combination, I performed a subintimal dissection, eventually re-entering into the aorta.  A hand injection verified re-entry.  I exchanged the wire for an Amplatz wire.  I place a pigtail catheter into the aorta, up to the level of L1.  The catheter was connected to the power injector circuit.  After de-airring and de-clotting the circuit, a power injector aortogram was completed.  I then pulled the catheter down to proximal to the aortic bifurcation.  A bilateral pelvic angiogram was completed to determine the proximal extent of disease.  There was right common iliac artery stump that was ~5 mm in length.  I then did another retrograde hand injection, which demonstrates the distal extent of the disease.  This chronic total occlusion measured ~3  cm with ~7 mm diameter in the entry and exit vessels.    The patient was given 7000 units of Heparin intravenously, which was a therapeutic bolus.  The catheter was removed and the sheath exchanged for a 7-Fr long sheath which was placed into the aorta.  I selected a 7 mm x 38 mm iCAST  stent, as there was a concern for rupture with heavy calcification in the right common iliac artery.  The iCAST stent loaded into the occluded segment with minimal difficulty and the sheath was pulled distal to the occlusion.  I inflated the balloon to 10 atm for 1 minute, fully deploying the covered stent and resolving waists in the right common iliac artery at the same time.  The balloon was removed.  At this point, there was a strong pulse in the right groin that was not present previously.  I did a hand injection via the sheath which demonstrated recannulation of the right common iliac artery with good position proximally.  There is ~5 mm overhang of the distal stent in the dilated-post stenotic segment of the external iliac artery, but I expect the external iliac artery to remodel around the stent now that the stenosis has resolved.  At this point, I replaced the pigtail catheter over the wire and placed it proximal to the aortic bifurcation.  An automated bilateral leg runoff was completed.  The findings are as listed above.  The blood flow in the right leg is now faster than the left leg.  I replaced the wire into the catheter, straightening out the crook in the catheter.  Both were removed from the sheath.  The sheath was aspirated.  No clots were present and the sheath was reloaded with heparinized saline.  The sheath will be removed in the holding area once anticoagulation reverses.  COMPLICATIONS: none  CONDITION: stable   Leonides Sake, MD [facility] [place] Office: [phone] Pager: [phone]  10/03/2011, 12:31 PM

## 2011-10-04 LAB — POCT ACTIVATED CLOTTING TIME: Activated Clotting Time: 204 seconds

## 2011-10-31 ENCOUNTER — Encounter: Payer: Self-pay | Admitting: Vascular Surgery

## 2011-11-01 ENCOUNTER — Ambulatory Visit (INDEPENDENT_AMBULATORY_CARE_PROVIDER_SITE_OTHER): Payer: Medicare Other | Admitting: Vascular Surgery

## 2011-11-01 ENCOUNTER — Encounter: Payer: Self-pay | Admitting: Vascular Surgery

## 2011-11-01 ENCOUNTER — Encounter (INDEPENDENT_AMBULATORY_CARE_PROVIDER_SITE_OTHER): Payer: Medicare Other | Admitting: *Deleted

## 2011-11-01 VITALS — BP 134/71 | HR 65 | Temp 98.4°F | Resp 16 | Ht 70.0 in | Wt 187.0 lb

## 2011-11-01 DIAGNOSIS — I739 Peripheral vascular disease, unspecified: Secondary | ICD-10-CM

## 2011-11-01 DIAGNOSIS — Z48812 Encounter for surgical aftercare following surgery on the circulatory system: Secondary | ICD-10-CM | POA: Insufficient documentation

## 2011-11-01 DIAGNOSIS — I745 Embolism and thrombosis of iliac artery: Secondary | ICD-10-CM

## 2011-11-01 MED ORDER — CLOPIDOGREL BISULFATE 75 MG PO TABS: 75.0000 mg | ORAL_TABLET | Freq: Every day | ORAL | Status: DC

## 2011-11-01 NOTE — Progress Notes (Addendum)
[facility]   Postoperative Visit  History of Present Illness  [name] is a 61 y.o. male who presents for postoperative follow-up from procedure on Date: 10/03/11: subintimal angioplasty and stenting (SIA+S) R CIA .  The patient notes resolution of lower extremity symptoms but continued R hip pain.  The patient is able to complete their activities of daily living.  The patient's current symptoms are: none.  Past Medical History, Past Surgical History, Social History, Family History, Medications, Allergies, and Review of Systems are unchanged from previous evaluation on 10/03/11.  Physical Examination  Filed Vitals:   11/01/11 1523  BP: 134/71  Pulse: 65  Temp: 98.4 F (36.9 C)  TempSrc: Oral  Resp: 16  Height: 5\' 10"  (1.778 m)  Weight: 187 lb (84.823 kg)  SpO2: 98%   Body mass index is 26.83 kg/(m^2).  General: A&O x 3, WDWN  Pulmonary: Sym exp, good air movt, CTAB, no rales, rhonchi, & wheezing  Cardiac: RRR, Nl S1, S2, no Murmurs, rubs or gallops  Vascular: Vessel Right Left  Radial Palpable Palpable  Ulnar Palpable Palpable  Brachial Palpable Palpable  Carotid Palpable, without bruit Palpable, without bruit  Aorta Not palpable N/A  Femoral Palpable Palpable  Popliteal Not palpable Not palpable  PT Palpable Palpable  DP Palpable Not Palpable   Gastrointestinal: soft, NTND, -G/R, - HSM, - masses, - CVAT B  Musculoskeletal: M/S 5/5 throughout , Extremities without ischemic changes , right groin without hematoma, no echymosis present at cannulation site  Neurologic:  Pain and light touch intact in extremities , Motor exam as listed above  BLE ABI (Date: 11/01/11)  R: 1.35 (0.77), PT: biphasic, AT: triphasic  L: 1.12 (1.03), Peroneal: biphasic, PT: biphasic, AT: not detected  Medical Decision Making  [name] is a 61 y.o. male who presents s/p SIA+S R CIA. Based on his angiographic findings, this patient needs: routine  surveillance q3 months with Aortoiliac duplex and ABI Pt will follow up with Dr. Hart Rochester in 3 months. I discussed in depth with the patient the nature of atherosclerosis, and emphasized the importance of maximal medical management including strict control of blood pressure, blood glucose, and lipid levels, obtaining regular exercise, and cessation of smoking.  The patient is aware that without maximal medical management the underlying atherosclerotic disease process will progress, limiting the benefit of any interventions.  Thank you for allowing [place] to participate in this patient's care.  Leonides Sake, MD [facility] [place] Office: [phone] Pager: [phone]

## 2011-12-25 ENCOUNTER — Encounter: Payer: Self-pay | Admitting: Neurosurgery

## 2011-12-26 ENCOUNTER — Other Ambulatory Visit: Payer: Self-pay | Admitting: *Deleted

## 2011-12-26 ENCOUNTER — Ambulatory Visit: Payer: Medicare Other | Admitting: Neurosurgery

## 2011-12-26 DIAGNOSIS — Z48812 Encounter for surgical aftercare following surgery on the circulatory system: Secondary | ICD-10-CM

## 2011-12-26 DIAGNOSIS — I739 Peripheral vascular disease, unspecified: Secondary | ICD-10-CM

## 2012-01-13 ENCOUNTER — Ambulatory Visit: Payer: Medicare Other | Admitting: Nurse Practitioner

## 2012-01-13 ENCOUNTER — Other Ambulatory Visit (INDEPENDENT_AMBULATORY_CARE_PROVIDER_SITE_OTHER): Payer: Medicare Other

## 2012-01-13 ENCOUNTER — Ambulatory Visit (INDEPENDENT_AMBULATORY_CARE_PROVIDER_SITE_OTHER): Payer: Medicare Other | Admitting: Nurse Practitioner

## 2012-01-13 ENCOUNTER — Other Ambulatory Visit: Payer: Self-pay | Admitting: *Deleted

## 2012-01-13 ENCOUNTER — Encounter: Payer: Self-pay | Admitting: Nurse Practitioner

## 2012-01-13 VITALS — BP 152/92 | HR 82 | Ht 70.0 in | Wt 189.8 lb

## 2012-01-13 DIAGNOSIS — I10 Essential (primary) hypertension: Secondary | ICD-10-CM

## 2012-01-13 DIAGNOSIS — R0989 Other specified symptoms and signs involving the circulatory and respiratory systems: Secondary | ICD-10-CM

## 2012-01-13 DIAGNOSIS — I2510 Atherosclerotic heart disease of native coronary artery without angina pectoris: Secondary | ICD-10-CM

## 2012-01-13 DIAGNOSIS — I519 Heart disease, unspecified: Secondary | ICD-10-CM

## 2012-01-13 DIAGNOSIS — I25810 Atherosclerosis of coronary artery bypass graft(s) without angina pectoris: Secondary | ICD-10-CM

## 2012-01-13 DIAGNOSIS — I259 Chronic ischemic heart disease, unspecified: Secondary | ICD-10-CM

## 2012-01-13 DIAGNOSIS — I739 Peripheral vascular disease, unspecified: Secondary | ICD-10-CM

## 2012-01-13 DIAGNOSIS — R079 Chest pain, unspecified: Secondary | ICD-10-CM

## 2012-01-13 LAB — BASIC METABOLIC PANEL
BUN: 12 mg/dL (ref 6–23)
CO2: 29 mEq/L (ref 19–32)
Calcium: 9 mg/dL (ref 8.4–10.5)
Chloride: 101 mEq/L (ref 96–112)
Creatinine, Ser: 1 mg/dL (ref 0.4–1.5)
GFR: 102.39 mL/min (ref >60.00)
Glucose, Bld: 266 mg/dL — ABNORMAL HIGH (ref 70–99)
Potassium: 4 mEq/L (ref 3.5–5.1)
Sodium: 137 mEq/L (ref 135–145)

## 2012-01-13 MED ORDER — LISINOPRIL 10 MG PO TABS: 10.0000 mg | ORAL_TABLET | Freq: Every day | ORAL | Status: DC

## 2012-01-13 NOTE — Addendum Note (Signed)
Addended by: Tonita Phoenix on: 01/13/2012 10:14 AM   Modules accepted: Orders

## 2012-01-13 NOTE — Progress Notes (Signed)
[name] Date of Birth: [birth year] Medical Record #[id number]  History of Present Illness: [name] is seen back today for a 4 month check. He is a former patient of Dr. Margarita Mail. He has DM, HTN, HLD, ongoing tobacco abuse and PVD. He had had an abnormal stress echo and subsequent cath showing mild nonobstructive CAD, low normal EF at 45 to 50%. Past echo was at 50%. He is to be managed medically. Smoking cessation has been encouraged.   He comes back today. He is here alone. He is doing ok. Since he was last here he has had more PV issues and has had right iliac stenting back in September. He has stopped smoking. He continues to have some atypical chest pain that comes on with twisting, turning or sudden movements or picking up items. It usually resolves with pain medicine. He has a plate in his neck from Dr. Jeral Fruit and is thinking about getting back in with him for evaluation. He is not lightheaded or dizzy. His weight is up almost 10 pounds. BP is higher and has been higher at home as well.   Current Outpatient Prescriptions on File Prior to Visit  Medication Sig Dispense Refill  . atorvastatin (LIPITOR) 20 MG tablet Take 20 mg by mouth daily.      . celecoxib (CELEBREX) 200 MG capsule Take 200 mg by mouth daily.       . clopidogrel (PLAVIX) 75 MG tablet Take 1 tablet (75 mg total) by mouth daily.  30 tablet  0  . glipiZIDE (GLUCOTROL) 10 MG tablet Take 10 mg by mouth 2 (two) times daily before a meal.       . HYDROcodone-acetaminophen (LORTAB) 7.5-500 MG per tablet Take 1 tablet by mouth as needed.       . metFORMIN (GLUCOPHAGE) 500 MG tablet Take 500 mg by mouth 2 (two) times daily with a meal.      . omeprazole (PRILOSEC) 20 MG capsule Take 40 mg by mouth daily.       . tadalafil (CIALIS) 5 MG tablet Take 5 mg by mouth daily as needed. For erectile dysfunction.        No Known Allergies  Past Medical History  Diagnosis Date  . Diabetes mellitus     borderline. Now taking  oral hypoglycemics  . CAD (coronary artery disease)     nonobstrtuctive by cath 2007 & 2013  . PVD (peripheral vascular disease)     s/p left fem-pop bypass 2007; PTA and stenting of right iliac artery 09/2011  . Rheumatoid arthritis   . GERD (gastroesophageal reflux disease)   . Hypertension   . Hyperlipidemia   . COPD (chronic obstructive pulmonary disease)   . Leg pain   . Bronchitis   . Carotid artery occlusion   . Cervical spine disease     prior neck surgery in 2007 with Dr. Jeral Fruit    Past Surgical History  Procedure Date  . Repair of left arm fracture   . 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 Surgery scrotal / testicular   . Femoral-popliteal bypass graft 11/06/2005  . Pr vein bypass graft,aorto-fem-pop 06-22-2010    Redo Left Fem-Pop  . Iliac artery stent 10-03-11    Right CIA stenting    History  Smoking status  . Former Smoker -- 0.1 packs/day for 40 years  . Types: Cigarettes, Cigars  . Quit date: 09/01/2011  Smokeless tobacco  . Never Used    History  Alcohol Use  .  Yes    Comment: occasional beer    Family History  Problem Relation Age of Onset  . Heart attack Mother 43  . Stroke Father     Review of Systems: The review of systems is per the HPI.  All other systems were reviewed and are negative.  Physical Exam: BP 152/92  Pulse 82  Ht 5\' 10"  (1.778 m)  Wt 189 lb 12.8 oz (86.093 kg)  BMI 27.23 kg/m2 Patient is very pleasant and in no acute distress. Skin is warm and dry. Color is normal.  HEENT is unremarkable. Normocephalic/atraumatic. PERRL. Sclera are nonicteric. Neck is supple. No masses. No JVD. Lungs are clear. Cardiac exam shows a regular rate and rhythm. Abdomen is soft. Extremities are without edema. Gait and ROM are intact. No gross neurologic deficits noted.   LABORATORY DATA: Pending.   EKG today shows sinus rhythm and is normal.   Lab Results  Component Value Date   WBC 9.1 07/24/2011   HGB 15.0 10/03/2011   HCT 44.0 10/03/2011    PLT 214.0 07/24/2011   GLUCOSE 173* 10/03/2011   ALT 21 06/20/2010   AST 19 06/20/2010   NA 141 10/03/2011   K 4.6 10/03/2011   CL 104 10/03/2011   CREATININE 0.90 10/03/2011   BUN 20 10/03/2011   CO2 28 09/12/2011   INR 1.1* 07/24/2011   Cardiac Cath Impression from July 2013  1. Mild non-obstructive CAD  2. Low normal LV systolic function  3. Non-cardiac chest pain   Recommendations: Continue medical management. Aggressive risk factor reduction. Smoking cessation.     Assessment / Plan: 1. CAD - managed medically. This discomfort he describes has been a chronic issue and is clearly related to movement and position changes. Not felt to be cardiac. I have asked him to get back in touch with Dr. Jeral Fruit.   2. Mild LV dysfunction - on ACE  3. HTN - Lisinopril is increased today to 10 mg. Salt restriction is also encouraged.   4. Tobacco abuse - he has stopped smoking.   We are checking labs today. He wishes to be seen in [place] for his follow up. He will need to be seen in a month.   Patient is agreeable to this plan and will call if any problems develop in the interim.

## 2012-01-13 NOTE — Patient Instructions (Addendum)
We are going to increase your blood pressure medicine today.  Increase your Lisinopril to 10 mg a day. I have sent this prescription to the drug store. You can take 2 of your 5 mg tablets and use them up. The prescription at the drug store is for 10 mg.   Cut back on your salt  We need to check labs today  You need to be seen in about a month at the [place] office.  Congratulations for not smoking.   Make an appointment to be seen back by Dr. Jeral Fruit for your pain.   Call the [facility] office at [phone] if you have any questions, problems or concerns.

## 2012-01-22 ENCOUNTER — Ambulatory Visit (INDEPENDENT_AMBULATORY_CARE_PROVIDER_SITE_OTHER): Payer: Medicare Other | Admitting: *Deleted

## 2012-01-22 ENCOUNTER — Encounter: Payer: Self-pay | Admitting: *Deleted

## 2012-01-22 VITALS — BP 199/127 | HR 96 | Ht 70.0 in | Wt 192.0 lb

## 2012-01-22 DIAGNOSIS — I10 Essential (primary) hypertension: Secondary | ICD-10-CM

## 2012-01-22 NOTE — Patient Instructions (Signed)
Patient instructed to go to ED per [facility].

## 2012-01-22 NOTE — Progress Notes (Signed)
Patient walked into office c/o of being very dizzy and having elevated BP. Patient brought a list of BP's he took while at home. Patient said that the dizziness started on yesterday evening. Patient has taken all of his cardiac medications as directed but says he did miss his diabetic medication. Patient denies having side effects from medication since dose change. Patient denies chest pain or sob. Patient did take his medication today. Patient say's he has a scheduled appointment with [facility] at 1:30 pm and thought he would come here first to have the dizziness addressed with our office since he had recent increase in his lisinopril. Nurse observed patient walking and it was unsteady. Nurse advised patient that due to him being so unsteady and dizzy that he needed to have someone come to our office to pick him up because it would be unsafe for him to drive. Orthostatic BP done and results called to [facility]. MD advised patient to go to ED for evaluation.

## 2012-02-10 ENCOUNTER — Encounter: Payer: Self-pay | Admitting: Vascular Surgery

## 2012-02-11 ENCOUNTER — Other Ambulatory Visit: Payer: Medicare Other

## 2012-02-11 ENCOUNTER — Encounter (INDEPENDENT_AMBULATORY_CARE_PROVIDER_SITE_OTHER): Payer: Medicare Other | Admitting: *Deleted

## 2012-02-11 ENCOUNTER — Encounter: Payer: Self-pay | Admitting: Vascular Surgery

## 2012-02-11 ENCOUNTER — Ambulatory Visit (INDEPENDENT_AMBULATORY_CARE_PROVIDER_SITE_OTHER): Payer: Medicare Other | Admitting: Vascular Surgery

## 2012-02-11 VITALS — BP 137/89 | HR 91 | Ht 70.0 in | Wt 180.5 lb

## 2012-02-11 DIAGNOSIS — Z48812 Encounter for surgical aftercare following surgery on the circulatory system: Secondary | ICD-10-CM

## 2012-02-11 DIAGNOSIS — I739 Peripheral vascular disease, unspecified: Secondary | ICD-10-CM

## 2012-02-11 NOTE — Progress Notes (Signed)
Subjective:     Patient ID: [name], male   DOB: 05-Nov-1950, 62 y.o.   MRN: [id number]  HPI this 62 year old male returns for continued followup regarding his lower extremity occlusive disease. She previously had a left femoral-popliteal bypass graft by me in 2007 and revision of the graft in June of 2012. He also had right common iliac PTA and stenting by Dr. Standley Brooking September 2013. He denies any claudication symptoms at the present time is only ambulating about one block. He has no history of cellulitis infection gangrene or rest pain.  Past Medical History  Diagnosis Date  . Diabetes mellitus     borderline. Now taking oral hypoglycemics  . CAD (coronary artery disease)     nonobstrtuctive by cath 2007 & 2013  . PVD (peripheral vascular disease)     s/p left fem-pop bypass 2007; PTA and stenting of right iliac artery 09/2011  . Rheumatoid arthritis   . GERD (gastroesophageal reflux disease)   . Hypertension   . Hyperlipidemia   . COPD (chronic obstructive pulmonary disease)   . Leg pain   . Bronchitis   . Carotid artery occlusion   . Cervical spine disease     prior neck surgery in 2007 with Dr. Jeral Fruit  . Atrial fibrillation   . Stroke   . DVT (deep venous thrombosis)     History  Substance Use Topics  . Smoking status: Former Smoker -- 0.1 packs/day for 40 years    Types: Cigarettes, Cigars    Quit date: 09/01/2011  . Smokeless tobacco: Never Used  . Alcohol Use: Yes     Comment: occasional beer    Family History  Problem Relation Age of Onset  . Heart attack Mother 53  . Deep vein thrombosis Mother   . Diabetes Mother   . Hyperlipidemia Mother   . Stroke Father   . Diabetes Father   . Hyperlipidemia Father   . Diabetes Sister   . Hyperlipidemia Sister   . Diabetes Brother   . Hyperlipidemia Brother   . Hyperlipidemia Son     No Known Allergies  Current outpatient prescriptions:amoxicillin (AMOXIL) 500 MG capsule, Take 1 capsule by mouth 3 (three) times  daily., Disp: , Rfl: ;  atorvastatin (LIPITOR) 20 MG tablet, Take 20 mg by mouth daily., Disp: , Rfl: ;  celecoxib (CELEBREX) 200 MG capsule, Take 200 mg by mouth daily. , Disp: , Rfl: ;  clopidogrel (PLAVIX) 75 MG tablet, Take 1 tablet (75 mg total) by mouth daily., Disp: 30 tablet, Rfl: 0 glipiZIDE (GLUCOTROL) 10 MG tablet, Take 10 mg by mouth 2 (two) times daily before a meal. , Disp: , Rfl: ;  HYDROcodone-acetaminophen (LORTAB) 7.5-500 MG per tablet, Take 1 tablet by mouth as needed. , Disp: , Rfl: ;  lisinopril (PRINIVIL,ZESTRIL) 10 MG tablet, Take 20 mg by mouth daily., Disp: , Rfl: ;  metFORMIN (GLUCOPHAGE) 500 MG tablet, Take 500 mg by mouth daily with breakfast. , Disp: , Rfl:  omeprazole (PRILOSEC) 40 MG capsule, Take 40 mg by mouth daily., Disp: , Rfl: ;  predniSONE (STERAPRED UNI-PAK) 5 MG TABS, , Disp: , Rfl: ;  PROAIR HFA 108 (90 BASE) MCG/ACT inhaler, Inhale 2 puffs into the lungs as needed., Disp: , Rfl: ;  tadalafil (CIALIS) 5 MG tablet, Take 5 mg by mouth daily as needed. For erectile dysfunction., Disp: , Rfl:   BP 137/89  Pulse 91  Ht 5\' 10"  (1.778 m)  Wt 180 lb 8 oz (81.874 kg)  BMI 25.90 kg/m2  SpO2 94%  Body mass index is 25.90 kg/(m^2).           Review of Systems denies chest pain, dyspnea on exertion, PND, orthopnea, hemoptysis, claudication.    Objective:   Physical Exam blood pressure 137/89 heart rate 91 respirations 18 Gen.-alert and oriented x3 in no apparent distress HEENT normal for age Lungs no rhonchi or wheezing Cardiovascular regular rhythm no murmurs carotid pulses 3+ palpable no bruits audible Abdomen soft nontender no palpable masses Musculoskeletal free of  major deformities Skin clear -no rashes Neurologic normal Lower extremities 3+ femoral and dorsalis pedis pulses palpable bilaterally with no edema  Today I ordered lower extremity arterial Dopplers which are reviewed and interpreted. ABI on the right is 1.35 and on the left is 1.12.  Duplex scan of the left femoral-popliteal bypass graft reveals no evidence of stenosis. The right common iliac stent was not visualized well because of extensive bowel gas.     Assessment:     Nicely functioning left femoral-popliteal bypass and right iliac stent with ABIs greater than 1.0    Plan:     Return in 6 months for duplex scan of left femoral-popliteal graft and right common iliac stent with check ABIs and sees nurse practitioner

## 2012-02-12 ENCOUNTER — Other Ambulatory Visit: Payer: Self-pay | Admitting: *Deleted

## 2012-02-12 DIAGNOSIS — Z48812 Encounter for surgical aftercare following surgery on the circulatory system: Secondary | ICD-10-CM

## 2012-02-12 DIAGNOSIS — I739 Peripheral vascular disease, unspecified: Secondary | ICD-10-CM

## 2012-03-05 ENCOUNTER — Ambulatory Visit (INDEPENDENT_AMBULATORY_CARE_PROVIDER_SITE_OTHER): Payer: Medicare Other | Admitting: Cardiology

## 2012-03-05 ENCOUNTER — Encounter: Payer: Self-pay | Admitting: Cardiology

## 2012-03-05 VITALS — BP 117/77 | HR 83 | Ht 70.0 in | Wt 183.0 lb

## 2012-03-05 DIAGNOSIS — I429 Cardiomyopathy, unspecified: Secondary | ICD-10-CM

## 2012-03-05 DIAGNOSIS — I10 Essential (primary) hypertension: Secondary | ICD-10-CM | POA: Insufficient documentation

## 2012-03-05 DIAGNOSIS — I70219 Atherosclerosis of native arteries of extremities with intermittent claudication, unspecified extremity: Secondary | ICD-10-CM

## 2012-03-05 DIAGNOSIS — I2510 Atherosclerotic heart disease of native coronary artery without angina pectoris: Secondary | ICD-10-CM

## 2012-03-05 NOTE — Patient Instructions (Addendum)
Your physician recommends that you schedule a follow-up appointment in: 6 months. You will receive a reminder letter in the mail in about 4 months reminding you to call and schedule your appointment. If you don't receive this letter, please contact our office. Your physician recommends that you continue on your current medications as directed. Please refer to the Current Medication list given to you today. Your physician has requested that you have an echocardiogram in 6 months just before your next visit. Echocardiography is a painless test that uses sound waves to create images of your heart. It provides your doctor with information about the size and shape of your heart and how well your heart's chambers and valves are working. This procedure takes approximately one hour. There are no restrictions for this procedure.  

## 2012-03-05 NOTE — Assessment & Plan Note (Signed)
No active angina symptoms. Cardiac catheterization from last year demonstrated nonobstructive disease. Continue medical therapy. Encouraged regular exercise regimen. Followup arranged in 6 months.

## 2012-03-05 NOTE — Assessment & Plan Note (Signed)
Blood pressure well controlled today. No changes made. 

## 2012-03-05 NOTE — Progress Notes (Signed)
Clinical Summary [name] is a medically complex 62 y.o.male presenting to the office for followup. He is a former patient of Dr. Andee [name], has been followed most recently by Ms. [name] in our [place] office. He states that in general he has been doing well, no active angina symptoms. He has started walking and using a stationary bicycle more regularly. We reviewed his medications. Lipids have been followed by Dr. Sherril [name].  Lab work from January of this year showed potassium 4.0, BUN 12, creatinine 1.0.  I reviewed his cardiac catheterization results from last year, also history of cardiomyopathy with mild LV dysfunction. He has had stable weights, NYHA class II dyspnea, no pitting edema.   No Known Allergies  Current Outpatient Prescriptions  Medication Sig Dispense Refill  . atorvastatin (LIPITOR) 20 MG tablet Take 20 mg by mouth daily.      . celecoxib (CELEBREX) 200 MG capsule Take 200 mg by mouth daily.       . clopidogrel (PLAVIX) 75 MG tablet Take 1 tablet (75 mg total) by mouth daily.  30 tablet  0  . gabapentin (NEURONTIN) 300 MG capsule Take 300 mg by mouth daily. *Patient has not started this medication yet*      . glipiZIDE (GLUCOTROL) 10 MG tablet Take 10 mg by mouth 2 (two) times daily before a meal.       . HYDROcodone-acetaminophen (LORTAB) 7.5-500 MG per tablet Take 1 tablet by mouth as needed.       Marland Kitchen levofloxacin (LEVAQUIN) 500 MG tablet Take 500 mg by mouth daily. Will finish in 4 days      . lisinopril (PRINIVIL,ZESTRIL) 10 MG tablet Take 20 mg by mouth daily.      . metFORMIN (GLUCOPHAGE) 500 MG tablet Take 500 mg by mouth daily with breakfast.       . omeprazole (PRILOSEC) 40 MG capsule Take 40 mg by mouth daily.      Marland Kitchen PROAIR HFA 108 (90 BASE) MCG/ACT inhaler Inhale 2 puffs into the lungs as needed.      . tadalafil (CIALIS) 5 MG tablet Take 5 mg by mouth daily as needed. For erectile dysfunction.      . traMADol (ULTRAM) 50 MG tablet Take 50 mg by mouth every 8  (eight) hours as needed.        No current facility-administered medications for this visit.    Past Medical History  Diagnosis Date  . Type 2 diabetes mellitus   . Coronary atherosclerosis of native coronary artery     Nonobstrtuctive by catheterization 2007 and 2013  . PAD (peripheral artery disease)     s/p left fem-pop bypass 2007; PTA and stenting of right iliac artery 09/2011  . Rheumatoid arthritis   . GERD (gastroesophageal reflux disease)   . Essential hypertension, benign   . Hyperlipidemia   . COPD (chronic obstructive pulmonary disease)   . Bronchitis   . Carotid artery occlusion   . Cervical spine disease     Neck surgery in 2007 with Dr. Jeral [name]  . Atrial fibrillation   . Stroke   . DVT (deep venous thrombosis)     Social History [name] reports that he quit smoking about 6 months ago. His smoking use included Cigarettes and Cigars. He has a 4 pack-year smoking history. He has never used smokeless tobacco. [name] reports that  drinks alcohol.  Review of Systems No palpitations or syncope. States that he was having difficulty with hypotension when on the higher  dose lisinopril, although this has improved on the current dose. He follows blood pressures at home. Otherwise negative.  Physical Examination Filed Vitals:   03/05/12 1329  BP: 117/77  Pulse: 83   Filed Weights   03/05/12 1329  Weight: 183 lb (83.008 kg)   No acute distress. HEENT: Conjunctiva and lids normal, oropharynx clear. Neck: Supple, no elevated JVP, no thyromegaly. Lungs: Clear to auscultation, nonlabored breathing at rest. Cardiac: Regular rate and rhythm, no S3 or significant systolic murmur. Abdomen: Soft, nontender,bowel sounds present. Extremities: No pitting edema, distal pulses 1-2+. Skin: Warm and dry. Musculoskeletal: No kyphosis. Neuropsychiatric: Alert and oriented x3, affect grossly appropriate.   Problem List and Plan   Coronary atherosclerosis of native  coronary artery No active angina symptoms. Cardiac catheterization from last year demonstrated nonobstructive disease. Continue medical therapy. Encouraged regular exercise regimen. Followup arranged in 6 months.  Essential hypertension, benign Blood pressure well controlled today. No changes made.  Secondary cardiomyopathy, unspecified Mild LV systolic dysfunction documented previously, LVEF 45-50%. Continue medical therapy, followup echocardiogram for next visit.  Atherosclerosis of native arteries of the extremities with intermittent claudication Keep followup with Dr. Hart [name].    Jonelle Sidle, M.D., F.A.C.C.

## 2012-03-05 NOTE — Assessment & Plan Note (Signed)
Mild LV systolic dysfunction documented previously, LVEF 45-50%. Continue medical therapy, followup echocardiogram for next visit.

## 2012-03-05 NOTE — Assessment & Plan Note (Signed)
Keep followup with Dr. Hart Rochester.

## 2012-05-25 ENCOUNTER — Telehealth: Payer: Self-pay | Admitting: Cardiology

## 2012-05-25 NOTE — Telephone Encounter (Signed)
Patient has been experiencing chest pain for the past couple of weeks. They are becoming more frequent and stronger.   He is seeing his PCP this coming Friday and I scheduled him to see Gene June 5th  Advised him to go to ER if needed

## 2012-05-25 NOTE — Telephone Encounter (Signed)
noted 

## 2012-05-27 ENCOUNTER — Telehealth: Payer: Self-pay | Admitting: *Deleted

## 2012-05-27 NOTE — Telephone Encounter (Signed)
Patient called in with c/o left knee pain (joint pain only when walking). He reports no claudication symptoms; no discoloration, edema, fever, erythema and no rest pain. He has had no falls or injuries. Patient already has an appt with his PCP, Dr. Sherril Croon, on this Friday 05-29-12. He does have a history of rheumatoid arthritis and diabetes. We discussed the S&S of possible problems with his graft (Left fem-pop 2007, revision 2012).He will call us back if he has any LE coldness, numbness, discoloration,edema, rest pain or claudication etc.   Patient is in agreement with this plan and voices understanding of things to look out for.   He will call Dr. Sherril Croon to see if he can move up his appt.

## 2012-06-11 ENCOUNTER — Encounter: Payer: Medicare Other | Admitting: Physician Assistant

## 2012-06-11 NOTE — Progress Notes (Signed)
Primary Cardiologist: Simona Huh, MD    HPI: Patient presents for evaluation of CP.  No Known Allergies  Current Outpatient Prescriptions  Medication Sig Dispense Refill  . atorvastatin (LIPITOR) 20 MG tablet Take 20 mg by mouth daily.      . celecoxib (CELEBREX) 200 MG capsule Take 200 mg by mouth daily.       . clopidogrel (PLAVIX) 75 MG tablet Take 1 tablet (75 mg total) by mouth daily.  30 tablet  0  . gabapentin (NEURONTIN) 300 MG capsule Take 300 mg by mouth daily. *Patient has not started this medication yet*      . glipiZIDE (GLUCOTROL) 10 MG tablet Take 10 mg by mouth 2 (two) times daily before a meal.       . HYDROcodone-acetaminophen (LORTAB) 7.5-500 MG per tablet Take 1 tablet by mouth as needed.       Marland Kitchen levofloxacin (LEVAQUIN) 500 MG tablet Take 500 mg by mouth daily. Will finish in 4 days      . lisinopril (PRINIVIL,ZESTRIL) 10 MG tablet Take 20 mg by mouth daily.      . metFORMIN (GLUCOPHAGE) 500 MG tablet Take 500 mg by mouth daily with breakfast.       . omeprazole (PRILOSEC) 40 MG capsule Take 40 mg by mouth daily.      Marland Kitchen PROAIR HFA 108 (90 BASE) MCG/ACT inhaler Inhale 2 puffs into the lungs as needed.      . tadalafil (CIALIS) 5 MG tablet Take 5 mg by mouth daily as needed. For erectile dysfunction.      . traMADol (ULTRAM) 50 MG tablet Take 50 mg by mouth every 8 (eight) hours as needed.        No current facility-administered medications for this visit.    Past Medical History  Diagnosis Date  . Type 2 diabetes mellitus   . Coronary atherosclerosis of native coronary artery     Nonobstrtuctive by catheterization 2007 and 2013  . PAD (peripheral artery disease)     s/p left fem-pop bypass 2007; PTA and stenting of right iliac artery 09/2011  . Rheumatoid arthritis   . GERD (gastroesophageal reflux disease)   . Essential hypertension, benign   . Hyperlipidemia   . COPD (chronic obstructive pulmonary disease)   . Bronchitis   . Carotid artery occlusion   .  Cervical spine disease     Neck surgery in 2007 with Dr. Jeral Fruit  . Atrial fibrillation   . Stroke   . DVT (deep venous thrombosis)     Past Surgical History  Procedure Laterality Date  . Repair of left arm fracture    . 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 Surgery scrotal / testicular    . Femoral-popliteal bypass graft  11/06/2005  . Pr vein bypass graft,aorto-fem-pop  06-22-2010    Redo Left Fem-Pop  . Iliac artery stent  10-03-11    Right CIA stenting    History   Social History  . Marital Status: Single    Spouse Name: N/A    Number of Children: N/A  . Years of Education: N/A   Occupational History  . Not on file.   Social History Main Topics  . Smoking status: Former Smoker -- 0.10 packs/day for 40 years    Types: Cigarettes, Cigars    Quit date: 09/01/2011  . Smokeless tobacco: Never Used  . Alcohol Use: Yes     Comment: occasional beer  . Drug Use: No  . Sexually Active:  Not Currently   Other Topics Concern  . Not on file   Social History Narrative   The patient lives in [place].     Family History  Problem Relation Age of Onset  . Heart attack Mother 42  . Deep vein thrombosis Mother   . Diabetes Mother   . Hyperlipidemia Mother   . Stroke Father   . Diabetes Father   . Hyperlipidemia Father   . Diabetes Sister   . Hyperlipidemia Sister   . Diabetes Brother   . Hyperlipidemia Brother   . Hyperlipidemia Son     ROS: no nausea, vomiting; no fever, chills; no melena, hematochezia; no claudication  PHYSICAL EXAM: There were no vitals taken for this visit. GENERAL: 62 year old male; NAD HEENT: NCAT, PERRLA, EOMI; sclera clear; no xanthelasma NECK: palpable bilateral carotid pulses, no bruits; no JVD; no TM LUNGS: CTA bilaterally CARDIAC: RRR (S1, S2); no significant murmurs; no rubs or gallops ABDOMEN: soft, non-tender; intact BS EXTREMETIES: intact distal pulses; no significant peripheral edema SKIN: warm/dry; no obvious  rash/lesions MUSCULOSKELETAL: no joint deformity NEURO: no focal deficit; NL affect   EKG: reviewed and available in Electronic Records   ASSESSMENT & PLAN:  No problem-specific assessment & plan notes found for this encounter.   [name], PAC

## 2012-07-02 ENCOUNTER — Encounter: Payer: Self-pay | Admitting: Physician Assistant

## 2012-07-02 ENCOUNTER — Ambulatory Visit (INDEPENDENT_AMBULATORY_CARE_PROVIDER_SITE_OTHER): Payer: Medicare Other | Admitting: Physician Assistant

## 2012-07-02 VITALS — BP 133/83 | HR 95 | Ht 70.0 in | Wt 188.8 lb

## 2012-07-02 DIAGNOSIS — I10 Essential (primary) hypertension: Secondary | ICD-10-CM

## 2012-07-02 DIAGNOSIS — E785 Hyperlipidemia, unspecified: Secondary | ICD-10-CM | POA: Insufficient documentation

## 2012-07-02 DIAGNOSIS — J449 Chronic obstructive pulmonary disease, unspecified: Secondary | ICD-10-CM

## 2012-07-02 DIAGNOSIS — R079 Chest pain, unspecified: Secondary | ICD-10-CM

## 2012-07-02 DIAGNOSIS — I2510 Atherosclerotic heart disease of native coronary artery without angina pectoris: Secondary | ICD-10-CM

## 2012-07-02 DIAGNOSIS — I739 Peripheral vascular disease, unspecified: Secondary | ICD-10-CM

## 2012-07-02 MED ORDER — NITROGLYCERIN 0.4 MG SL SUBL: 0.4000 mg | SUBLINGUAL_TABLET | SUBLINGUAL | Status: DC | PRN

## 2012-07-02 NOTE — Assessment & Plan Note (Signed)
Followed by Dr. Hart Rochester, in [place]

## 2012-07-02 NOTE — Assessment & Plan Note (Signed)
No further workup indicated. Patient presents with long-standing history of atypical CP, unpredictable in onset and of very brief duration, and with no strict correlation to exertion. Twelve-lead EKG today is benign. I provided reassurance that his symptoms are most likely noncardiac in origin, particularly in light of his negative cardiac catheterization result of one year ago. I recommended he continue his current medication regimen, with the addition of prn NTG, so as to provide additional diagnostic information, and resume followup with Dr. Diona Browner in 6 months.

## 2012-07-02 NOTE — Progress Notes (Signed)
Primary Cardiologist: Simona Huh, MD   HPI: Patient presents for evaluation of CP.  He suggests a several-month history of intermittent CP, unpredictable in onset, particularly intense (8/10), but sharp in quality, lasting only a few seconds in duration. This happens several times a week. He denies any exertional CP. He also denies any radiation of his CP to the jaw or upper extremities, or any associated dyspnea or diaphoresis. He denies any exacerbation with movement of the torso, arms, or with deep inspiration. He also denies symptoms suggestive of active reflux disease. Of note, he believes that this chest pain, which has been ongoing for quite some time, and which preceded his last OV, may be attributed to some neck surgery that he had in the past.   Twelve-lead EKG today, reviewed by me, indicates NSR 97 the p.m.; NSST changes  No Known Allergies  Current Outpatient Prescriptions  Medication Sig Dispense Refill  . atorvastatin (LIPITOR) 20 MG tablet Take 20 mg by mouth daily.      . celecoxib (CELEBREX) 200 MG capsule Take 200 mg by mouth daily.       . clopidogrel (PLAVIX) 75 MG tablet Take 1 tablet (75 mg total) by mouth daily.  30 tablet  0  . glipiZIDE (GLUCOTROL) 10 MG tablet Take 10 mg by mouth 2 (two) times daily before a meal.       . HYDROcodone-acetaminophen (LORTAB) 7.5-500 MG per tablet Take 1 tablet by mouth as needed.       Marland Kitchen lisinopril (PRINIVIL,ZESTRIL) 10 MG tablet Take 20 mg by mouth daily.      . metFORMIN (GLUCOPHAGE) 500 MG tablet Take 500 mg by mouth daily with breakfast.       . NON FORMULARY Take 1 tablet by mouth 2 (two) times daily. Cinnamon Biotin Chromium      . omeprazole (PRILOSEC) 40 MG capsule Take 40 mg by mouth daily.      Marland Kitchen PROAIR HFA 108 (90 BASE) MCG/ACT inhaler Inhale 2 puffs into the lungs as needed.      . tadalafil (CIALIS) 5 MG tablet Take 5 mg by mouth daily as needed. For erectile dysfunction.      . gabapentin (NEURONTIN) 300 MG capsule  Take 300 mg by mouth daily. *Patient has not started this medication yet*      . nitroGLYCERIN (NITROSTAT) 0.4 MG SL tablet Place 1 tablet (0.4 mg total) under the tongue every 5 (five) minutes as needed for chest pain.  90 tablet  3   No current facility-administered medications for this visit.    Past Medical History  Diagnosis Date  . Type 2 diabetes mellitus   . Coronary atherosclerosis of native coronary artery     Nonobstrtuctive by catheterization 2007 and 2013  . PAD (peripheral artery disease)     s/p left fem-pop bypass 2007; PTA and stenting of right iliac artery 09/2011  . Rheumatoid arthritis(714.0)   . GERD (gastroesophageal reflux disease)   . Essential hypertension, benign   . Hyperlipidemia   . COPD (chronic obstructive pulmonary disease)   . Bronchitis   . Carotid artery occlusion   . Cervical spine disease     Neck surgery in 2007 with Dr. Jeral Fruit  . Atrial fibrillation   . Stroke   . DVT (deep venous thrombosis)     Past Surgical History  Procedure Laterality Date  . Repair of left arm fracture    . 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 Surgery scrotal /  testicular    . Femoral-popliteal bypass graft  11/06/2005  . Pr vein bypass graft,aorto-fem-pop  06-22-2010    Redo Left Fem-Pop  . Iliac artery stent  10-03-11    Right CIA stenting    History   Social History  . Marital Status: Single    Spouse Name: N/A    Number of Children: N/A  . Years of Education: N/A   Occupational History  . Not on file.   Social History Main Topics  . Smoking status: Former Smoker -- 0.10 packs/day for 40 years    Types: Cigarettes, Cigars    Quit date: 09/01/2011  . Smokeless tobacco: Never Used  . Alcohol Use: Yes     Comment: occasional beer  . Drug Use: No  . Sexually Active: Not Currently   Other Topics Concern  . Not on file   Social History Narrative   The patient lives in [place].     Family History  Problem Relation Age of Onset  . Heart attack Mother  45  . Deep vein thrombosis Mother   . Diabetes Mother   . Hyperlipidemia Mother   . Stroke Father   . Diabetes Father   . Hyperlipidemia Father   . Diabetes Sister   . Hyperlipidemia Sister   . Diabetes Brother   . Hyperlipidemia Brother   . Hyperlipidemia Son     ROS: no nausea, vomiting; no fever, chills; no melena, hematochezia; no claudication  PHYSICAL EXAM: BP 133/83  Pulse 95  Ht 5\' 10"  (1.778 m)  Wt 188 lb 12.8 oz (85.639 kg)  BMI 27.09 kg/m2 GENERAL: 62 year old male; NAD HEENT: NCAT, PERRLA, EOMI; sclera clear; no xanthelasma NECK: palpable bilateral carotid pulses, no bruits; no JVD; no TM LUNGS: CTA bilaterally CARDIAC: RRR (S1, S2); no significant murmurs; no rubs or gallops ABDOMEN: soft, non-tender; intact BS EXTREMETIES: no significant peripheral edema SKIN: warm/dry; no obvious rash/lesions MUSCULOSKELETAL: no joint deformity NEURO: no focal deficit; NL affect   EKG: reviewed and available in Electronic Records   ASSESSMENT & PLAN:  Coronary atherosclerosis of native coronary artery No further workup indicated. Patient presents with long-standing history of atypical CP, unpredictable in onset and of very brief duration, and with no strict correlation to exertion. Twelve-lead EKG today is benign. I provided reassurance that his symptoms are most likely noncardiac in origin, particularly in light of his negative cardiac catheterization result of one year ago. I recommended he continue his current medication regimen, with the addition of prn NTG, so as to provide additional diagnostic information, and resume followup with Dr. Diona Browner in 6 months.  Essential hypertension, benign Followed by primary M.D.  Hyperlipidemia Recommended low intensity statin therapy, with target LDL of 100 or less, if feasible.  CHRONIC OBSTRUCTIVE PULMONARY DISEASE Patient quit smoking approximately one year ago.  PERIPHERAL VASCULAR DISEASE Followed by Dr. Hart Rochester, in  [facility]    Gene [place], [facility]

## 2012-07-02 NOTE — Assessment & Plan Note (Signed)
Recommended low intensity statin therapy, with target LDL of 100 or less, if feasible.

## 2012-07-02 NOTE — Assessment & Plan Note (Signed)
Patient quit smoking approximately one year ago.

## 2012-07-02 NOTE — Patient Instructions (Addendum)
Your physician recommends that you schedule a follow-up appointment in: 6 months. You will receive a reminder letter in the mail in about 4 months reminding you to call and schedule your appointment. If you don't receive this letter, please contact our office. Your physician recommends that you continue on your current medications as directed. Please refer to the Current Medication list given to you today. You have been given a prescription today for nitroglycerin to take for chest pain. Dissolve one under tongue for chest pain every 5 minutes up to 3 doses. If no relief, proceed to ED.

## 2012-07-02 NOTE — Assessment & Plan Note (Signed)
Followed by primary M.D. 

## 2012-07-23 ENCOUNTER — Other Ambulatory Visit: Payer: Self-pay | Admitting: *Deleted

## 2012-07-23 DIAGNOSIS — I739 Peripheral vascular disease, unspecified: Secondary | ICD-10-CM

## 2012-07-23 DIAGNOSIS — Z48812 Encounter for surgical aftercare following surgery on the circulatory system: Secondary | ICD-10-CM

## 2012-08-05 ENCOUNTER — Telehealth: Payer: Self-pay

## 2012-08-05 IMAGING — CR DG CERVICAL SPINE FLEX&EXT ONLY
2 series · 2 of 2 positions shown · non-contrast
Comparison: Intraoperative films 04/11/2009.

CLINICAL DATA: The left-sided neck pain with flexion.  Surgery
April 2009.

CERVICAL SPINE - FLEXION AND EXTENSION VIEWS ONLY

[w c-spine flexion]
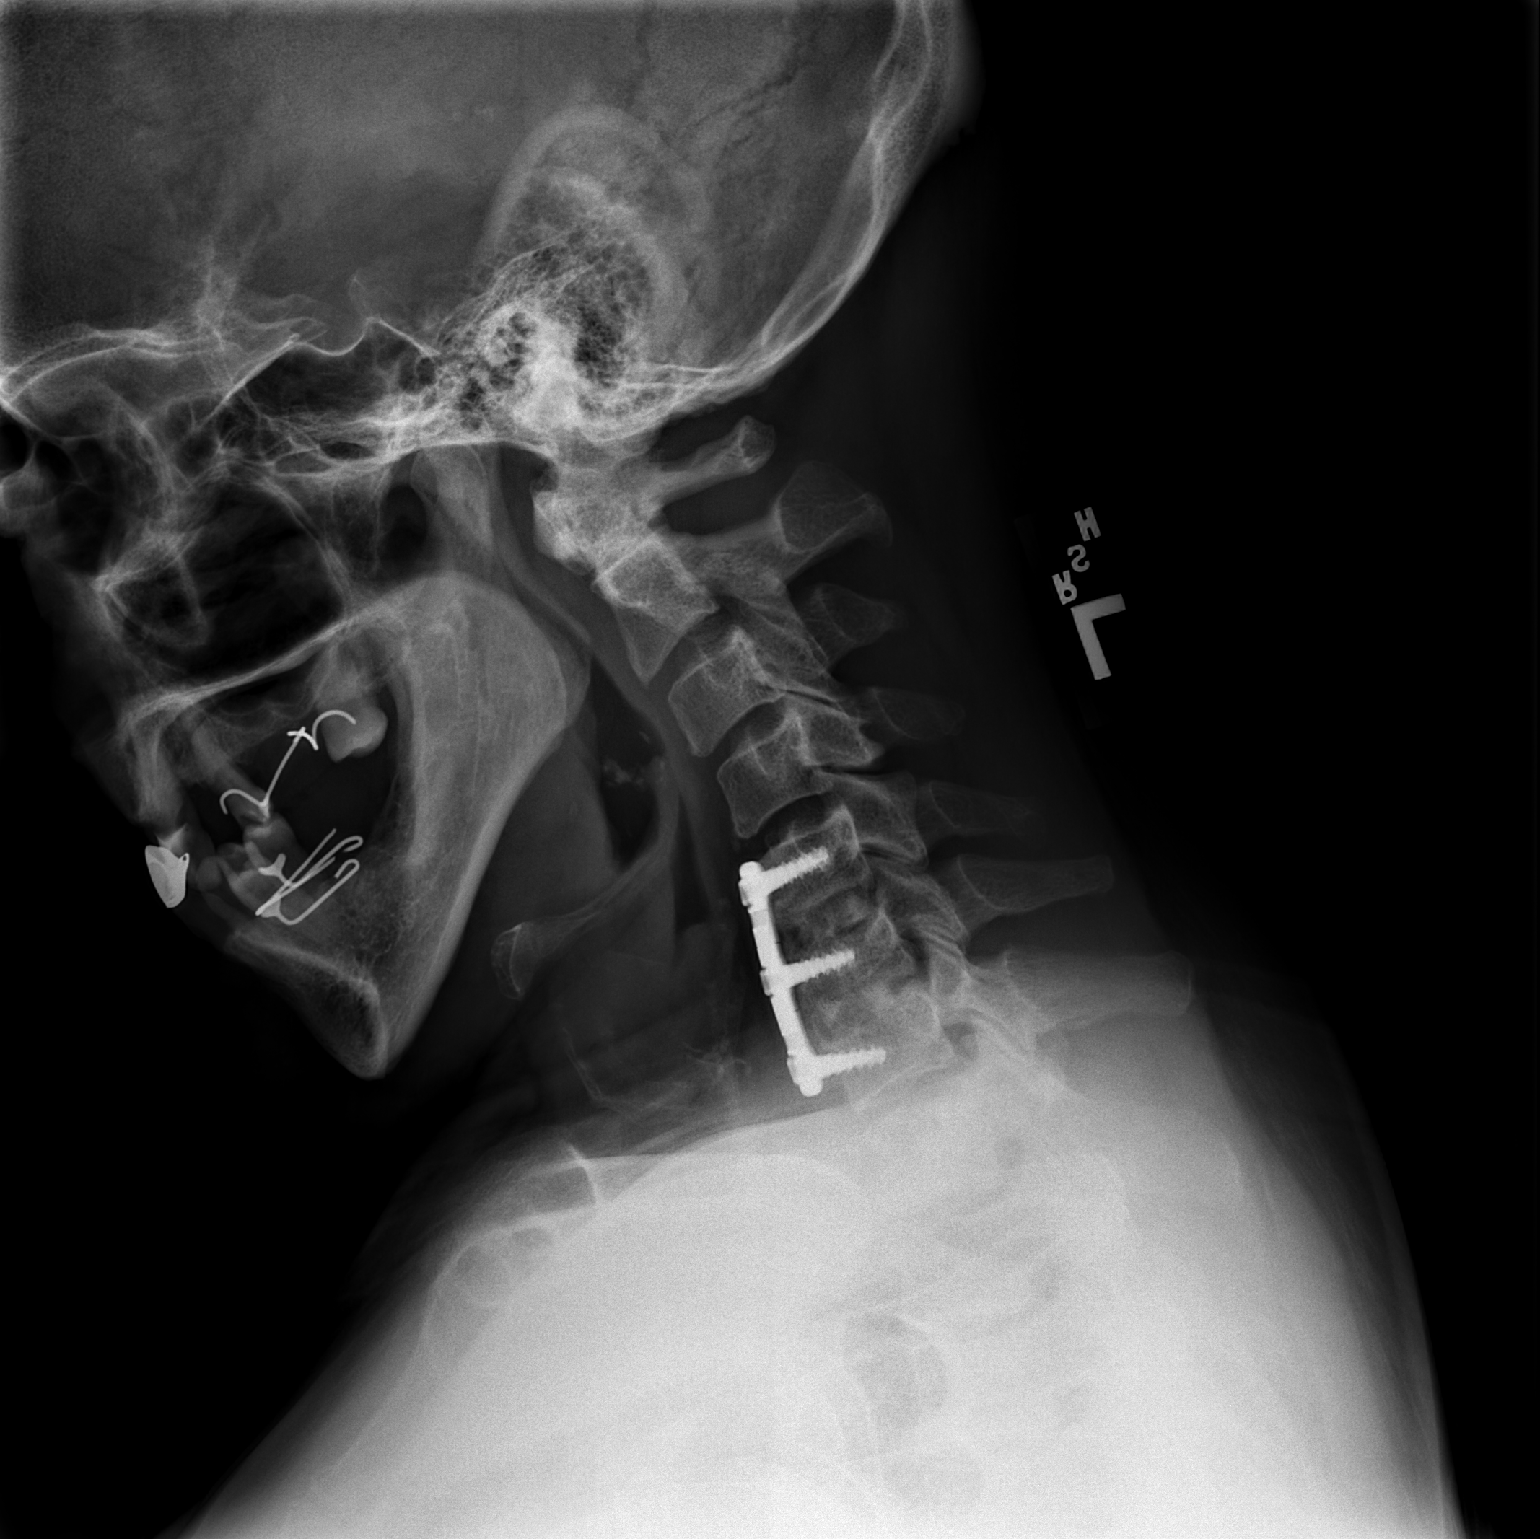

[w c-spine extension]
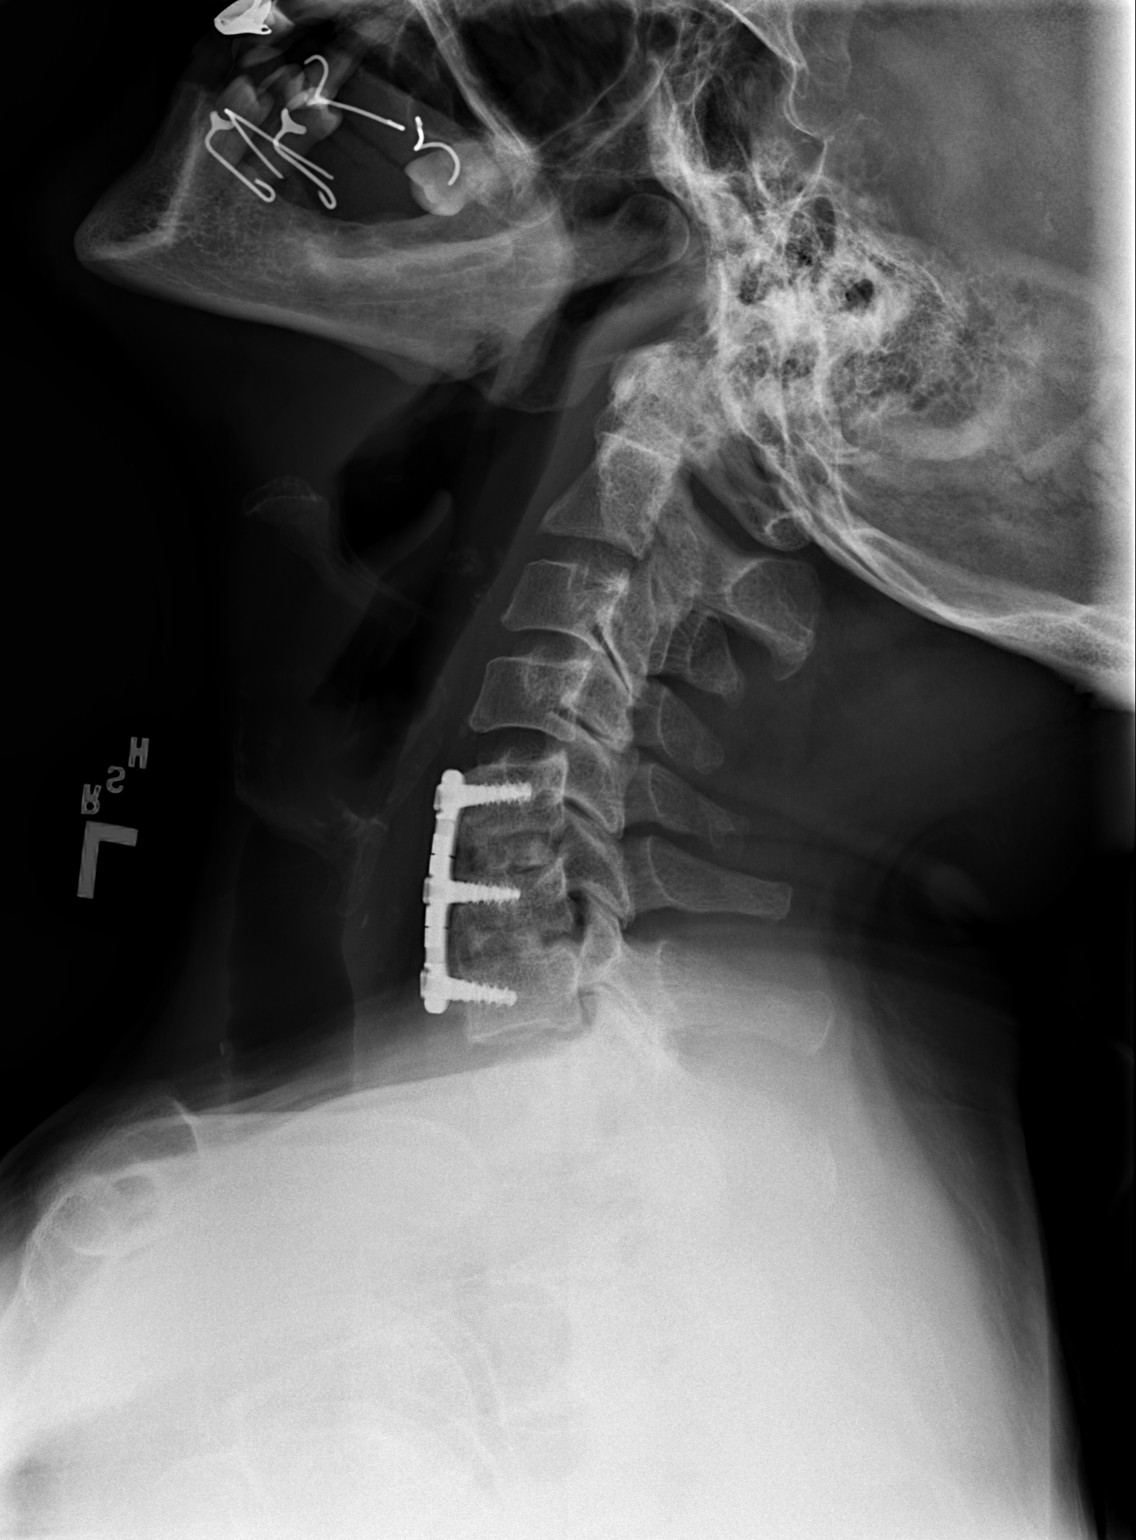

[2 of 2 positions shown; findings below may reference images not displayed]

FINDINGS: The patient is status post anterior fusion at C5-6 and C6-
7.  Anterior plate and screw fixation is noted.  The hardware is
intact.

There is no abnormal movement within the fused segments during
flexion or extension.  Limited range of motion from the skull base
through C4-5 is within normal limits.  The prevertebral soft
tissues are normal.  No other acute abnormalities evident.
IMPRESSION: 1.  Status post anterior fusion at C5-6 and C6-7 without
radiographic evidence for complication.
2.  Normal movement above the fused segments from the skull base
through C4-5.

## 2012-08-05 IMAGING — CR DG CHEST 2V
2 series · 2 of 2 positions shown · non-contrast
Comparison: 04/11/2009

CLINICAL DATA: Preop evaluation fem-pop bypass.  Chest pain and
short of breath.  Smoker.

CHEST - 2 VIEW

[view not recorded (1 of 2)]
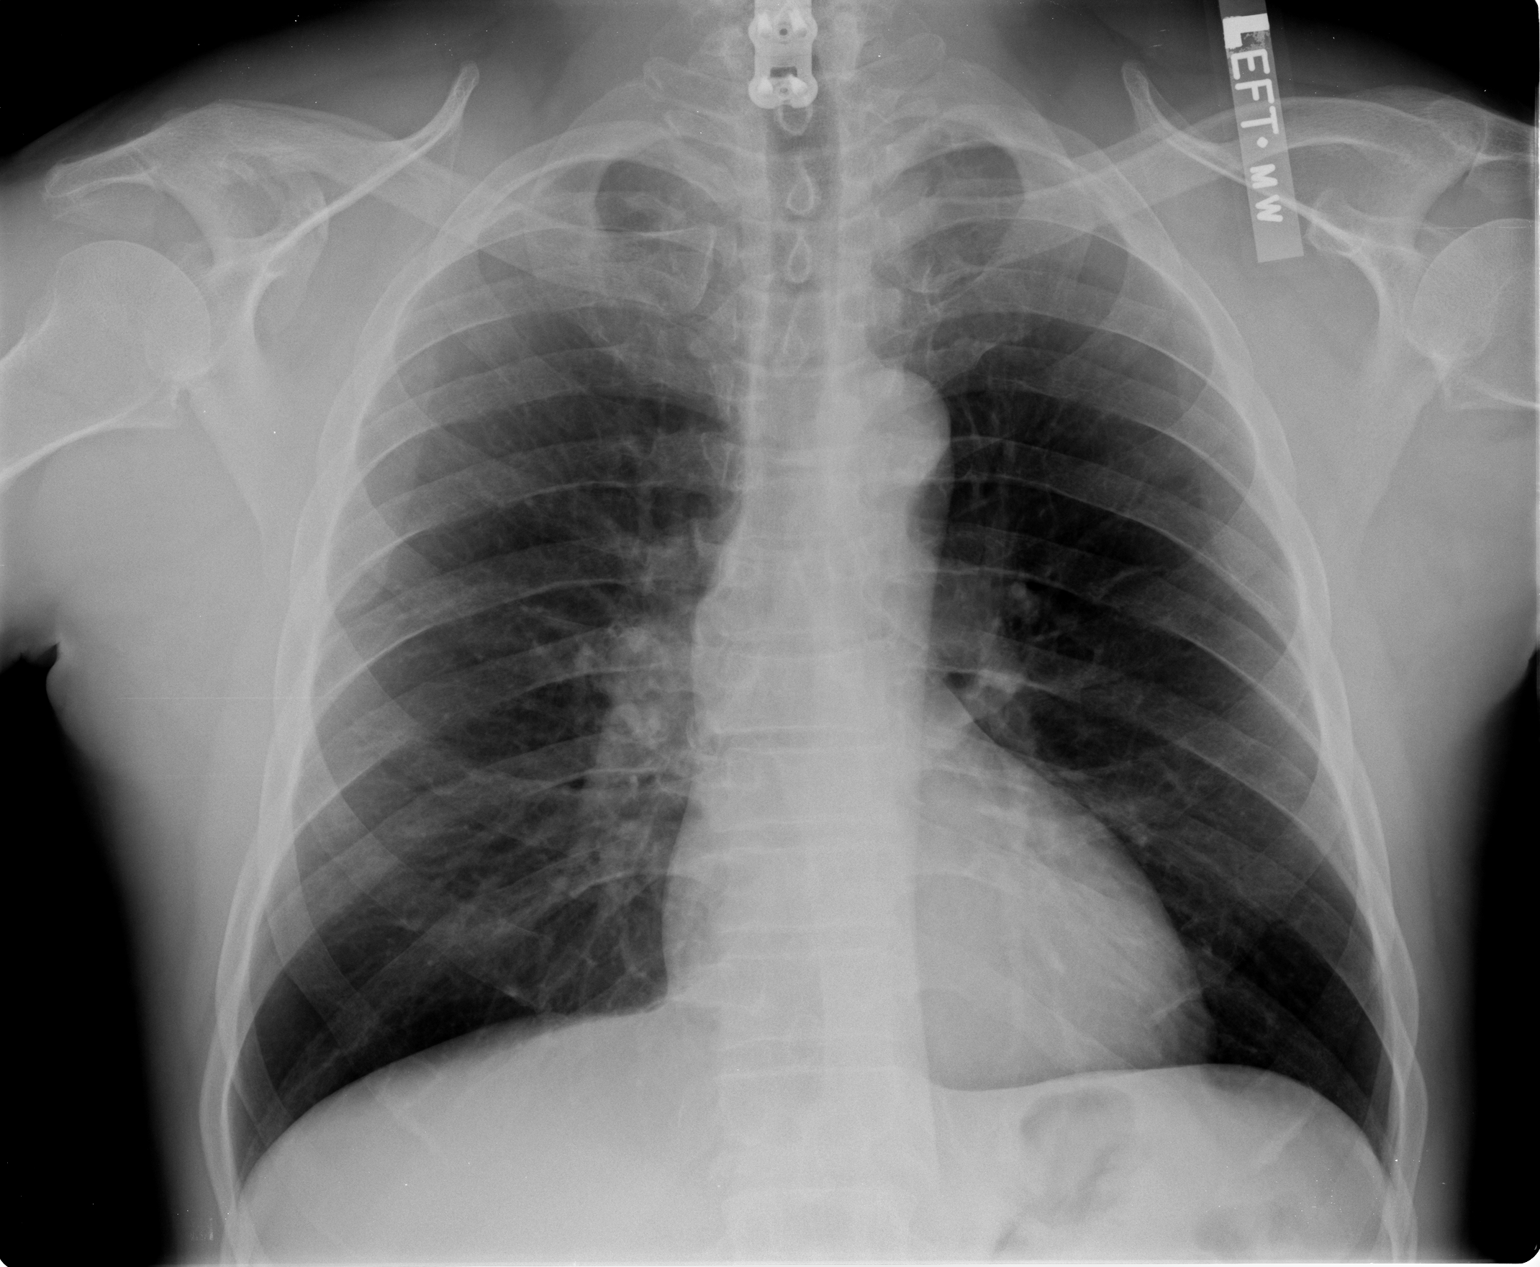

[view not recorded (2 of 2)]
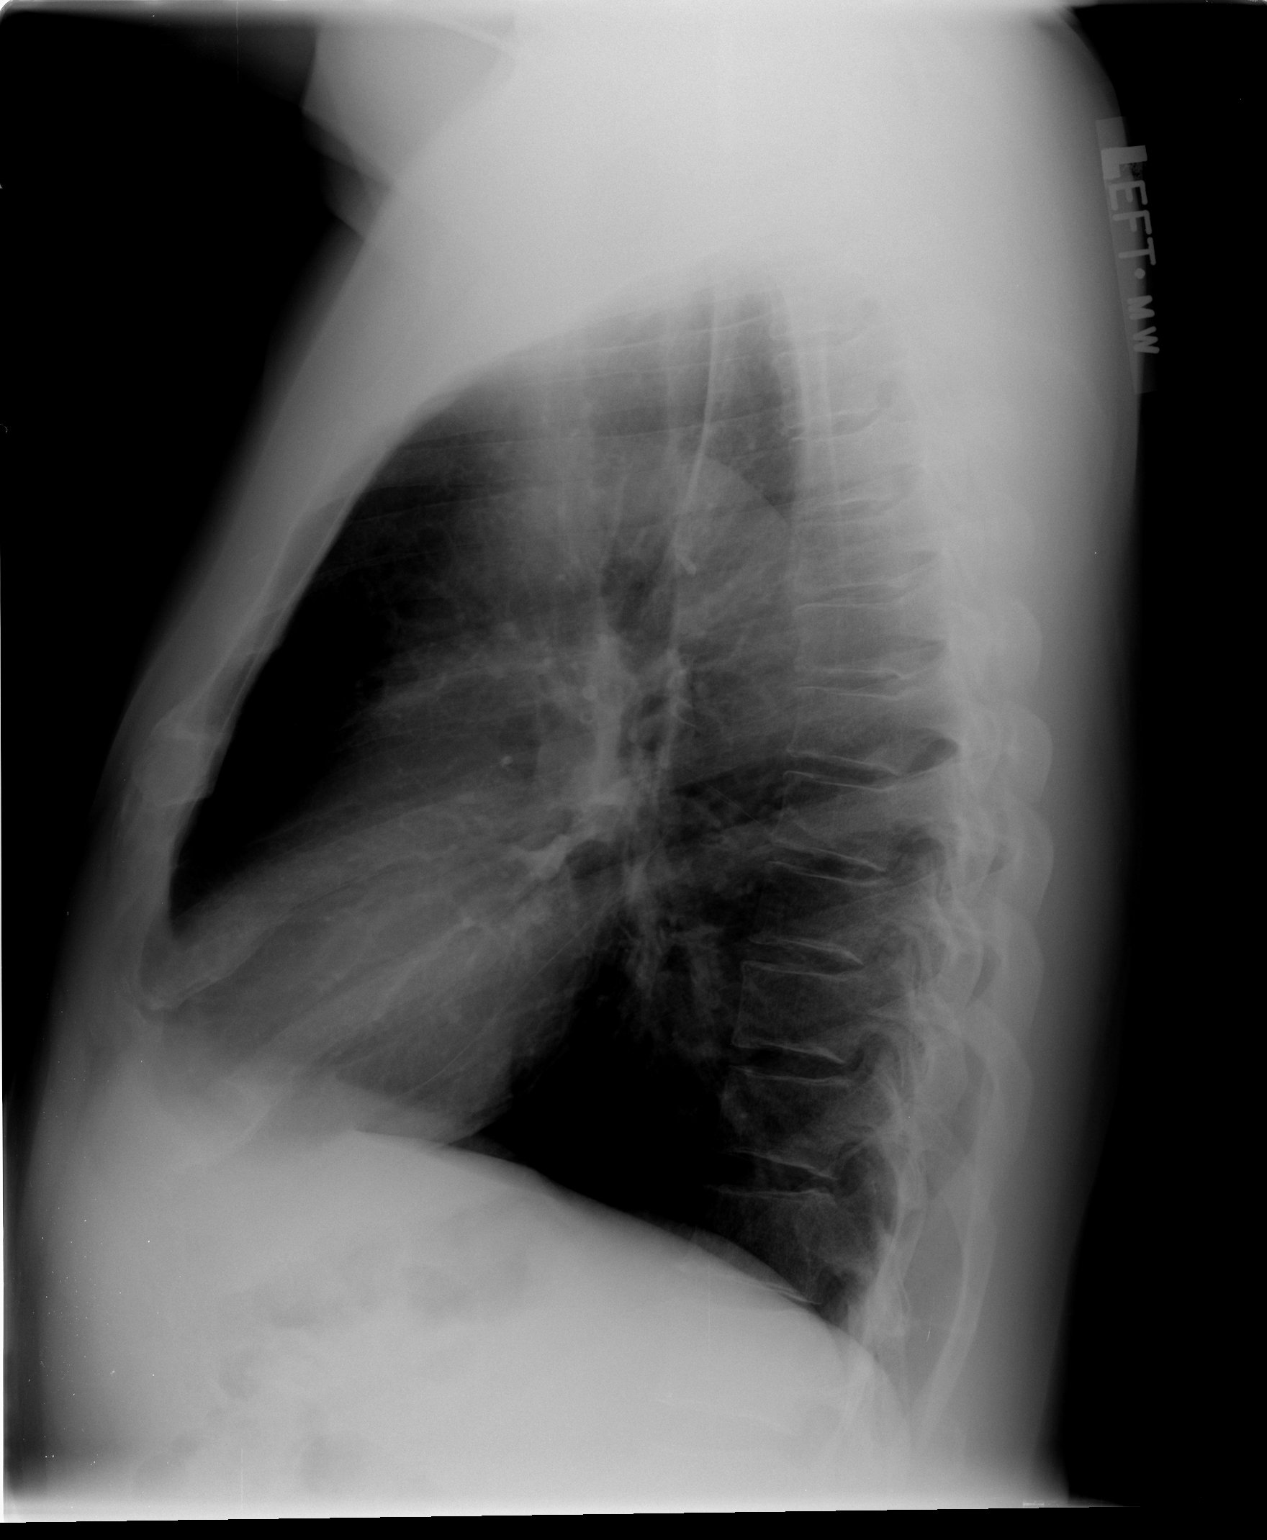

[2 of 2 positions shown; findings below may reference images not displayed]

FINDINGS: Interval ACDF of the cervical spine.

Heart size and pulmonary vascularity within normal limits.
Negative for heart failure or pneumonia.  Negative for mass lesion.
Mild hyperinflation.
IMPRESSION: No active cardiopulmonary disease.

## 2012-08-05 NOTE — Telephone Encounter (Signed)
Rec'd. Call from Jodie for Dr. Katrinka Blazing at [facility], inquiring if pt. Needs to have antibiotic prophylaxis prior to dental procedures, given hx. Of right CIA stent.  Per guidelines of vascular surgeons, antibiotic prophylaxis is recommended for all pt's with vascular stents/ stent graft repairs, per the AHA dosing guidelines.  Advised Jodie per phone of this recommendation.  Will fax note to dental office @ [phone].

## 2012-08-18 ENCOUNTER — Ambulatory Visit: Payer: Medicare Other | Admitting: Neurosurgery

## 2012-08-18 ENCOUNTER — Other Ambulatory Visit: Payer: Medicare Other

## 2012-08-25 DIAGNOSIS — R079 Chest pain, unspecified: Secondary | ICD-10-CM

## 2012-08-31 ENCOUNTER — Other Ambulatory Visit (INDEPENDENT_AMBULATORY_CARE_PROVIDER_SITE_OTHER): Payer: Medicare Other | Admitting: *Deleted

## 2012-08-31 ENCOUNTER — Encounter (INDEPENDENT_AMBULATORY_CARE_PROVIDER_SITE_OTHER): Payer: Medicare Other | Admitting: *Deleted

## 2012-08-31 DIAGNOSIS — I739 Peripheral vascular disease, unspecified: Secondary | ICD-10-CM

## 2012-08-31 DIAGNOSIS — Z48812 Encounter for surgical aftercare following surgery on the circulatory system: Secondary | ICD-10-CM

## 2012-09-01 ENCOUNTER — Other Ambulatory Visit: Payer: Self-pay | Admitting: *Deleted

## 2012-09-01 DIAGNOSIS — Z48812 Encounter for surgical aftercare following surgery on the circulatory system: Secondary | ICD-10-CM

## 2012-09-01 DIAGNOSIS — I739 Peripheral vascular disease, unspecified: Secondary | ICD-10-CM

## 2012-09-03 ENCOUNTER — Encounter: Payer: Self-pay | Admitting: Cardiology

## 2012-09-03 ENCOUNTER — Ambulatory Visit (INDEPENDENT_AMBULATORY_CARE_PROVIDER_SITE_OTHER): Payer: Medicare Other | Admitting: Cardiology

## 2012-09-03 VITALS — BP 128/85 | HR 69 | Ht 69.0 in | Wt 192.0 lb

## 2012-09-03 DIAGNOSIS — I10 Essential (primary) hypertension: Secondary | ICD-10-CM

## 2012-09-03 DIAGNOSIS — I2510 Atherosclerotic heart disease of native coronary artery without angina pectoris: Secondary | ICD-10-CM

## 2012-09-03 DIAGNOSIS — I429 Cardiomyopathy, unspecified: Secondary | ICD-10-CM

## 2012-09-03 NOTE — Patient Instructions (Addendum)
Your physician recommends that you schedule a follow-up appointment in: 6 months in [place] office  Your physician recommends that you continue on your current medications as directed. Please refer to the Current Medication list given to you today.

## 2012-09-03 NOTE — Assessment & Plan Note (Signed)
LVEF 45-50%, no heart failure symptoms. Blood pressure well controlled. Continue medical therapy.

## 2012-09-03 NOTE — Assessment & Plan Note (Signed)
Blood pressure control looks good today. 

## 2012-09-03 NOTE — Assessment & Plan Note (Signed)
No clear-cut angina with history of mild nonobstructive CAD most recently assessed in July 2013. Plan is to continue medical therapy.

## 2012-09-03 NOTE — Progress Notes (Signed)
Clinical Summary [name] is a 62 y.o.male last seen in the office back in June by [name] PA-C. He reported symptoms at that time of atypical chest discomfort (has fairly chronic history of same), ECG was reviewed, no further testing was recommended at that point.   Interval record review finds evaluation at the [facility] ER with chest pain. Lab work reviewed, total CK was elevated but his CK-MB was negative as well as troponin I. BNP was normal at 34. Chest x-ray demonstrated no acute disease. ECG showed sinus rhythm with nonspecific T-wave changes.  He had a cardiac catheterization done in July of 2013 that showed mild nonobstructive CAD, LVEF approximately 50%.  He is here with his wife today. The thoracic discomfort he describes is very atypical, sounds musculoskeletal in etiology. Seems to be worse after he lifts things and strains. Also notices a "cramping" knot in his chest wall that gets better with heat.   No Known Allergies  Current Outpatient Prescriptions  Medication Sig Dispense Refill  . atorvastatin (LIPITOR) 20 MG tablet Take 20 mg by mouth daily.      . celecoxib (CELEBREX) 200 MG capsule Take 200 mg by mouth daily.       . clopidogrel (PLAVIX) 75 MG tablet Take 1 tablet (75 mg total) by mouth daily.  30 tablet  0  . glipiZIDE (GLUCOTROL) 10 MG tablet Take 10 mg by mouth 2 (two) times daily before a meal.       . HYDROcodone-acetaminophen (LORTAB) 7.5-500 MG per tablet Take 1 tablet by mouth as needed.       Marland Kitchen lisinopril (PRINIVIL,ZESTRIL) 20 MG tablet Take 20 mg by mouth daily.      . metFORMIN (GLUCOPHAGE) 500 MG tablet Take 500 mg by mouth daily with breakfast.       . nitroGLYCERIN (NITROSTAT) 0.4 MG SL tablet Place 1 tablet (0.4 mg total) under the tongue every 5 (five) minutes as needed for chest pain.  90 tablet  3  . omeprazole (PRILOSEC) 40 MG capsule Take 40 mg by mouth daily.      Marland Kitchen oxyCODONE-acetaminophen (PERCOCET/ROXICET) 5-325 MG per tablet Take 1  tablet by mouth every 6 (six) hours as needed.       . tadalafil (CIALIS) 5 MG tablet Take 5 mg by mouth daily as needed. For erectile dysfunction.      . traMADol (ULTRAM) 50 MG tablet Take 50 mg by mouth every 6 (six) hours as needed.        No current facility-administered medications for this visit.    Past Medical History  Diagnosis Date  . Type 2 diabetes mellitus   . Coronary atherosclerosis of native coronary artery     Nonobstrtuctive by catheterization 2007 and 2013  . PAD (peripheral artery disease)     s/p left fem-pop bypass 2007; PTA and stenting of right iliac artery 09/2011  . Rheumatoid arthritis(714.0)   . GERD (gastroesophageal reflux disease)   . Essential hypertension, benign   . Hyperlipidemia   . COPD (chronic obstructive pulmonary disease)   . Bronchitis   . Carotid artery occlusion   . Cervical spine disease     Neck surgery in 2007 with Dr. Jeral [name]  . Atrial fibrillation   . Stroke   . DVT (deep venous thrombosis)     Social History [name] reports that he quit smoking about a year ago. His smoking use included Cigarettes and Cigars. He has a 4 pack-year smoking history. He has  never used smokeless tobacco. [name] reports that  drinks alcohol.  Review of Systems Negative except as outlined.  Physical Examination Filed Vitals:   09/03/12 0842  BP: 128/85  Pulse: 69   Filed Weights   09/03/12 0842  Weight: 192 lb (87.091 kg)   Comfortable at rest.  HEENT: Conjunctiva and lids normal, oropharynx clear.  Neck: Supple, no elevated JVP, no thyromegaly.  Lungs: Clear to auscultation, nonlabored breathing at rest.  Cardiac: Regular rate and rhythm, no S3 or significant systolic murmur.  Abdomen: Soft, nontender,bowel sounds present.  Extremities: No pitting edema, distal pulses 1-2+.  Skin: Warm and dry.  Musculoskeletal: No kyphosis.  Neuropsychiatric: Alert and oriented x3, affect grossly appropriate.   Problem List and Plan    Coronary atherosclerosis of native coronary artery No clear-cut angina with history of mild nonobstructive CAD most recently assessed in July 2013. Plan is to continue medical therapy.  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 Blood pressure well controlled. Continue medical therapy.  Essential hypertension, benign Blood pressure control looks good today.    [name], M.D., F.A.C.C.

## 2012-09-04 ENCOUNTER — Encounter: Payer: Medicare Other | Admitting: Cardiology

## 2012-09-09 ENCOUNTER — Encounter: Payer: Self-pay | Admitting: Vascular Surgery

## 2012-10-13 ENCOUNTER — Telehealth: Payer: Self-pay

## 2012-10-13 DIAGNOSIS — I739 Peripheral vascular disease, unspecified: Secondary | ICD-10-CM

## 2012-10-13 DIAGNOSIS — M79609 Pain in unspecified limb: Secondary | ICD-10-CM

## 2012-10-13 NOTE — Telephone Encounter (Signed)
Discussed pt. symptoms with Dr. Hart Rochester.  Advised to schedule pt. for appt. for left LE arterial duplex and ABI's, and appt. with nurse practitioner tomorrow.

## 2012-10-13 NOTE — Telephone Encounter (Signed)
Pt. Called with c/o pain in his left calf .  States it has been going on x 2 weeks.  Reports that he noticed a sore on his lateral left knee first; he said " I must have bumped it, but I don't remember."   States there was a scab over the sore, but it has healed.  States the lateral side of the knee is "very sore on the bone."   Unable to tell if there is any discoloration, redness, or inflammation.  Denies fever or chills.  States "I sweat because I am a diabetic."  Reports that there was pain in the knee area, first, and it has moved into the left calf.  States the calf pain is present with walking, and worse when he lays down at night.  Denies swelling.  Will discuss w/ Dr. Hart Rochester.

## 2012-10-14 ENCOUNTER — Encounter: Payer: Self-pay | Admitting: Family

## 2012-10-14 ENCOUNTER — Ambulatory Visit (INDEPENDENT_AMBULATORY_CARE_PROVIDER_SITE_OTHER): Payer: Medicare Other | Admitting: Family

## 2012-10-14 ENCOUNTER — Ambulatory Visit (HOSPITAL_COMMUNITY)
Admission: RE | Admit: 2012-10-14 | Discharge: 2012-10-14 | Disposition: A | Discharge status: Home / Self Care | Payer: Medicare Other | Source: Ambulatory Visit | Attending: Family | Admitting: Family

## 2012-10-14 ENCOUNTER — Ambulatory Visit (INDEPENDENT_AMBULATORY_CARE_PROVIDER_SITE_OTHER)
Admission: RE | Admit: 2012-10-14 | Discharge: 2012-10-14 | Disposition: A | Discharge status: Home / Self Care | Payer: Medicare Other | Source: Ambulatory Visit | Attending: Family | Admitting: Family

## 2012-10-14 VITALS — BP 132/88 | HR 69 | Resp 16 | Ht 70.0 in | Wt 185.0 lb

## 2012-10-14 DIAGNOSIS — I739 Peripheral vascular disease, unspecified: Secondary | ICD-10-CM

## 2012-10-14 DIAGNOSIS — M79609 Pain in unspecified limb: Secondary | ICD-10-CM | POA: Insufficient documentation

## 2012-10-14 DIAGNOSIS — I70219 Atherosclerosis of native arteries of extremities with intermittent claudication, unspecified extremity: Secondary | ICD-10-CM

## 2012-10-14 NOTE — Progress Notes (Signed)
[facility] [place] HISTORY AND PHYSICAL -PAD  Previous Bypass Surgery/ Stent Placement: Yes Surgeon:Lawson, Chen  History of Present Illness [name] is a 62 y.o. male has a history of lower extremity occlusive disease. He previously had a left femoral-popliteal bypass graft by Dr. Hart Rochester in 2007 and revision of the graft in June of 2012. He also had right common iliac PTA and stenting by Dr. Standley Brooking September 2013. He denies any claudication symptoms at the present time is only ambulating about one block. He has no history of cellulitis infection gangrene or rest pain. He returns today for 2 weeks history of left knee pain, concerned this is related to his LE occlusive disease Pain in left knee, lateral aspect, started 2 weeks ago, he does not know of any injury. Subsequently pain then started at posterior aspect of left knee only at night. Soreness at lateral aspect left knee is aggravated by standing and flexing left knee, and climbing stairs; he denies problems with right knee. He denies claudication in thighs and calves with walking. He denies hx of MI, positive for atrial fib. in the past.  Patient denies non healing ulcers on lower extremities.  Patient denies New Medical or Surgical History.   Pt Diabetic: Yes, states last A1C was 7.? Pt smoker: quit October, 2013  Pt meds include: Statin :Yes ASA: No Other anticoagulants/antiplatelets: Plavix  Past Medical History  Diagnosis Date  . Type 2 diabetes mellitus   . Coronary atherosclerosis of native coronary artery     Nonobstrtuctive by catheterization 2007 and 2013  . PAD (peripheral artery disease)     s/p left fem-pop bypass 2007; PTA and stenting of right iliac artery 09/2011  . Rheumatoid arthritis(714.0)   . GERD (gastroesophageal reflux disease)   . Essential hypertension, benign   . Hyperlipidemia   . COPD (chronic obstructive pulmonary disease)   . Bronchitis   . Carotid artery occlusion    . Cervical spine disease     Neck surgery in 2007 with Dr. Jeral Fruit  . Atrial fibrillation   . Stroke   . DVT (deep venous thrombosis)     Social History History  Substance Use Topics  . Smoking status: Former Smoker -- 0.10 packs/day for 40 years    Types: Cigarettes, Cigars    Quit date: 09/01/2011  . Smokeless tobacco: Never Used  . Alcohol Use: Yes     Comment: occasional beer    Family History Family History  Problem Relation Age of Onset  . Heart attack Mother 13  . Deep vein thrombosis Mother   . Diabetes Mother   . Hyperlipidemia Mother   . Stroke Father   . Diabetes Father   . Hyperlipidemia Father   . Diabetes Sister   . Hyperlipidemia Sister   . Diabetes Brother   . Hyperlipidemia Brother   . Hyperlipidemia Son     Past Surgical History  Procedure Laterality Date  . Repair of left arm fracture    . 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 Surgery scrotal / testicular    . Femoral-popliteal bypass graft  11/06/2005  . Pr vein bypass graft,aorto-fem-pop  06-22-2010    Redo Left Fem-Pop  . Iliac artery stent  10-03-11    Right CIA stenting    No Known Allergies  Current Outpatient Prescriptions  Medication Sig Dispense Refill  . atorvastatin (LIPITOR) 20 MG tablet Take 20 mg by mouth daily.      . celecoxib (CELEBREX) 200  MG capsule Take 200 mg by mouth daily.       . clopidogrel (PLAVIX) 75 MG tablet Take 1 tablet (75 mg total) by mouth daily.  30 tablet  0  . glipiZIDE (GLUCOTROL) 10 MG tablet Take 10 mg by mouth 2 (two) times daily before a meal.       . HYDROcodone-acetaminophen (LORTAB) 7.5-500 MG per tablet Take 1 tablet by mouth as needed.       Marland Kitchen lisinopril (PRINIVIL,ZESTRIL) 20 MG tablet Take 20 mg by mouth daily.      . metFORMIN (GLUCOPHAGE) 500 MG tablet Take 500 mg by mouth daily with breakfast.       . nitroGLYCERIN (NITROSTAT) 0.4 MG SL tablet Place 1 tablet (0.4 mg total) under the tongue every 5 (five) minutes as needed for chest pain.  90  tablet  3  . omeprazole (PRILOSEC) 40 MG capsule Take 40 mg by mouth daily.      Marland Kitchen oxyCODONE-acetaminophen (PERCOCET/ROXICET) 5-325 MG per tablet Take 1 tablet by mouth every 6 (six) hours as needed.       . tadalafil (CIALIS) 5 MG tablet Take 5 mg by mouth daily as needed. For erectile dysfunction.      . traMADol (ULTRAM) 50 MG tablet Take 50 mg by mouth every 6 (six) hours as needed.        No current facility-administered medications for this visit.    ROS: [x]  Positive   [ ]  Denies  General:[ ]  Weight loss,  [ ]  Weight gain, [ ]  Fever, [ ]  chills Neurologic: [ ]  Dizziness, [ ]  Blackouts, [ ]  Seizure [ ]  Stroke, [ ]  "Mini stroke", [ ]  Slurred speech, [ ]  Temporary blindness;  [ ] weakness, [ ]  Hoarseness Cardiac: [ ]  Chest pain/pressure, [ ]  Shortness of breath at rest [ ]  Shortness of breath with exertion,  [ ]   Atrial fibrillation or irregular heartbeat Vascular:[ ]  Pain in legs with walking, [ ]  Pain in legs at rest ,[ ]  Pain in legs at night,  [ ]   Non-healing ulcer, [ ]  Blood clot in vein/DVT,   Pulmonary: [ ]  Home oxygen, [ ]   Productive cough, [ ]  Coughing up blood,  [ ]  Asthma,  [ ]  Wheezing Musculoskeletal:  [ ]  Arthritis, [ ]  Low back pain,  [ ]  Joint pain Hematologic:[ ]  Easy Bruising, [ ]  Anemia; [ ]  Hepatitis Gastrointestinal: [ ]  Blood in stool,  [ ]  Gastroesophageal Reflux, [ ]  Trouble swallowing Urinary: [ ]  chronic Kidney disease, [ ]  on HD, [ ]  Burning with urination, [ ]  Frequent urination, [ ]  Difficulty urinating;  Skin: [ ]  Rashes, [ ]  Wounds     Physical Examination  Filed Vitals:   10/14/12 1159  BP: 132/88  Pulse: 69  Resp: 16   Filed Weights   10/14/12 1159  Weight: 185 lb (83.915 kg)   Body mass index is 26.54 kg/(m^2).  General: A&O x 3, WDWN,  Gait: normal Eyes: PERRLA, Pulmonary: CTAB, without wheezes , rales or rhonchi Cardiac: regular Rythm , without murmur          Carotid Bruits Left Right   Negative Negative  Aorta: not  palpable Radial pulses: 2+ palpable.                           VASCULAR EXAM: Extremities without ischemic changes  without Gangrene; without open wounds, no swelling at either knee, no edema, no redness nor  streaking.                                                                                                          LE Pulses LEFT RIGHT       FEMORAL   palpable   palpable        POPLITEAL  not palpable   not  palpable       POSTERIOR TIBIAL  not palpable   not palpable        DORSALIS PEDIS      ANTERIOR TIBIAL  palpable   palpable    Abdomen: soft, NT, no masses. Skin: no rashes, no ulcers noted. Musculoskeletal: no muscle wasting or atrophy.  Neurologic: A&O X 3; Appropriate Affect ; SENSATION: normal; MOTOR FUNCTION:  moving all extremities equally, motor strength 5/5 throughout. Speech is fluent/normal. CN 2-12 intact except hard of hearing (he thinks he has wax in his ears).    Non-Invasive Vascular Imaging: DATE: 10/14/2012 ABI: RIGHT 1.22, Waveforms: biphasic PT, triphasic DP;  LEFT 1.09, Waveforms: biphasic PT and DP DUPLEX SCAN OF BYPASS: Widely patent left  femoral-popliteal bypass graft with normal velocities.  Previous ABIs (08/31/12): right: 1.38, left: 1.19  Outside Studies/Documentation  ASSESSMENT: [name] is a 62 y.o. male who presents with 2 weeks history of left knee pain. His left  femoral-popliteal bypass graft is widely patent with no evidence of stenosis, no evidence of left popliteal aneurysm. Pt. Complaints, ABI and LE arterial Duplex results are not consistent with claudication, may be left knee ligament or Baker's cyst related. His symptoms and exam are not consistent with a DVT. He indicates relief at knowing this is not related to his history of arterial occlusion.   PLAN:  I discussed in depth with the patient the nature of atherosclerosis, and emphasized the importance of maximal medical management including strict control of blood  pressure, blood glucose, and lipid levels, obtaining regular exercise, and continued cessation of smoking.  The patient is aware that without maximal medical management the underlying atherosclerotic disease process will progress, limiting the benefit of any interventions. He is already scheduled to return in August, 2015. Will request that nurse call him next week and ask if he is having any leg swelling or change in his left knee symptoms. The patient was given information about PAD including signs, symptoms, treatment, what symptoms should prompt the patient to seek immediate medical care, and risk reduction measures to take.  Charisse March, RN, MSN, FNP-C [facility] Phone: [phone]  Clinic MD: Edilia Bo  10/14/2012 11:10 AM

## 2012-10-14 NOTE — Patient Instructions (Signed)
Peripheral Vascular Disease Peripheral Vascular Disease (PVD), also called Peripheral Arterial Disease (PAD), is a circulation problem caused by cholesterol (atherosclerotic plaque) deposits in the arteries. PVD commonly occurs in the lower extremities (legs) but it can occur in other areas of the body, such as your arms. The cholesterol buildup in the arteries reduces blood flow which can cause pain and other serious problems. The presence of PVD can place a person at risk for Coronary Artery Disease (CAD).  CAUSES  Causes of PVD can be many. It is usually associated with more than one risk factor such as:   High Cholesterol.  Smoking.  Diabetes.  Lack of exercise or inactivity.  High blood pressure (hypertension).  Obesity.  Family history. SYMPTOMS   When the lower extremities are affected, patients with PVD may experience:  Leg pain with exertion or physical activity. This is called INTERMITTENT CLAUDICATION. This may present as cramping or numbness with physical activity. The location of the pain is associated with the level of blockage. For example, blockage at the abdominal level (distal abdominal aorta) may result in buttock or hip pain. Lower leg arterial blockage may result in calf pain.  As PVD becomes more severe, pain can develop with less physical activity.  In people with severe PVD, leg pain may occur at rest.  Other PVD signs and symptoms:  Leg numbness or weakness.  Coldness in the affected leg or foot, especially when compared to the other leg.  A change in leg color.  Patients with significant PVD are more prone to ulcers or sores on toes, feet or legs. These may take longer to heal or may reoccur. The ulcers or sores can become infected.  If signs and symptoms of PVD are ignored, gangrene may occur. This can result in the loss of toes or loss of an entire limb.  Not all leg pain is related to PVD. Other medical conditions can cause leg pain such  as:  Blood clots (embolism) or Deep Vein Thrombosis.  Inflammation of the blood vessels (vasculitis).  Spinal stenosis. DIAGNOSIS  Diagnosis of PVD can involve several different types of tests. These can include:  Pulse Volume Recording Method (PVR). This test is simple, painless and does not involve the use of X-rays. PVR involves measuring and comparing the blood pressure in the arms and legs. An ABI (Ankle-Brachial Index) is calculated. The normal ratio of blood pressures is 1. As this number becomes smaller, it indicates more severe disease.  < 0.95  indicates significant narrowing in one or more leg vessels.  <0.8 there will usually be pain in the foot, leg or buttock with exercise.  <0.4 will usually have pain in the legs at rest.  <0.25  usually indicates limb threatening PVD.  Doppler detection of pulses in the legs. This test is painless and checks to see if you have a pulses in your legs/feet.  A dye or contrast material (a substance that highlights the blood vessels so they show up on x-ray) may be given to help your caregiver better see the arteries for the following tests. The dye is eliminated from your body by the kidney's. Your caregiver may order blood work to check your kidney function and other laboratory values before the following tests are performed:  Magnetic Resonance Angiography (MRA). An MRA is a picture study of the blood vessels and arteries. The MRA machine uses a large magnet to produce images of the blood vessels.  Computed Tomography Angiography (CTA). A CTA is a   specialized x-ray that looks at how the blood flows in your blood vessels. An IV may be inserted into your arm so contrast dye can be injected.  Angiogram. Is a procedure that uses x-rays to look at your blood vessels. This procedure is minimally invasive, meaning a small incision (cut) is made in your groin. A small tube (catheter) is then inserted into the artery of your groin. The catheter is  guided to the blood vessel or artery your caregiver wants to examine. Contrast dye is injected into the catheter. X-rays are then taken of the blood vessel or artery. After the images are obtained, the catheter is taken out. TREATMENT  Treatment of PVD involves many interventions which may include:  Lifestyle changes:  Quitting smoking.  Exercise.  Following a low fat, low cholesterol diet.  Control of diabetes.  Foot care is very important to the PVD patient. Good foot care can help prevent infection.  Medication:  Cholesterol-lowering medicine.  Blood pressure medicine.  Anti-platelet drugs.  Certain medicines may reduce symptoms of Intermittent Claudication.  Interventional/Surgical options:  Angioplasty. An Angioplasty is a procedure that inflates a balloon in the blocked artery. This opens the blocked artery to improve blood flow.  Stent Implant. A wire mesh tube (stent) is placed in the artery. The stent expands and stays in place, allowing the artery to remain open.  Peripheral Bypass Surgery. This is a surgical procedure that reroutes the blood around a blocked artery to help improve blood flow. This type of procedure may be performed if Angioplasty or stent implants are not an option. SEEK IMMEDIATE MEDICAL CARE IF:   You develop pain or numbness in your arms or legs.  Your arm or leg turns cold, becomes blue in color.  You develop redness, warmth, swelling and pain in your arms or legs. MAKE SURE YOU:   Understand these instructions.  Will watch your condition.  Will get help right away if you are not doing well or get worse. Document Released: 02/01/2004 Document Revised: 03/18/2011 Document Reviewed: 12/29/2007 [employer] Patient Information 2014 [employer].  

## 2012-10-20 ENCOUNTER — Telehealth: Payer: Self-pay | Admitting: *Deleted

## 2012-10-20 NOTE — Telephone Encounter (Signed)
I called [name], at the request of our nurse practioner, to see if his left leg pain had gotten any better or worse. He reports that it is feeling somewhat better, pain is localized in the joint and behind his knee. He states that he has no swelling, redness or erythema. He has been afebrile. [name] has been using a heating pad for short periods of time along with using Aspircream to the area. He will contact his PCP for recommendations or referral to an orthopedist asap. He will contact us if he develops any of the above symptoms. He is in agreement with this plan and voices understanding.

## 2012-12-01 ENCOUNTER — Other Ambulatory Visit: Payer: Self-pay | Admitting: *Deleted

## 2012-12-01 DIAGNOSIS — I739 Peripheral vascular disease, unspecified: Secondary | ICD-10-CM

## 2012-12-01 DIAGNOSIS — I25810 Atherosclerosis of coronary artery bypass graft(s) without angina pectoris: Secondary | ICD-10-CM

## 2012-12-01 MED ORDER — CLOPIDOGREL BISULFATE 75 MG PO TABS: 75.0000 mg | ORAL_TABLET | Freq: Every day | ORAL | Status: DC

## 2013-03-04 ENCOUNTER — Telehealth: Payer: Self-pay

## 2013-03-04 NOTE — Telephone Encounter (Signed)
Phone call from pt. With c/o pain in left leg that has worsened over past month.  Reports (L) leg pain occurs both at rest and with walking.  States that the pain @ rest occurs mostly in the left thigh and knee.  Reports that "the left leg feels very weak" when he walks.  States his PCP evaluated his left knee pain, and questioned if the pt. pulled something in the knee.  But, pt. states the pain and weakness in the leg is worsening.  Requests appt. to have evaluated.  Denies any open sores on the left leg.  Discussed with Dr. Oneida Alar.  Recommends to bring pt. In to see Dr. Kellie Simmering next week, and sched. left leg arterial duplex of BPG and ABI's

## 2013-03-08 ENCOUNTER — Encounter: Payer: Self-pay | Admitting: Vascular Surgery

## 2013-03-09 ENCOUNTER — Ambulatory Visit (HOSPITAL_COMMUNITY)
Admission: RE | Admit: 2013-03-09 | Discharge: 2013-03-09 | Disposition: A | Discharge status: Home / Self Care | Payer: Medicare Other | Source: Ambulatory Visit | Attending: Vascular Surgery | Admitting: Vascular Surgery

## 2013-03-09 ENCOUNTER — Ambulatory Visit (INDEPENDENT_AMBULATORY_CARE_PROVIDER_SITE_OTHER)
Admission: RE | Admit: 2013-03-09 | Discharge: 2013-03-09 | Disposition: A | Discharge status: Home / Self Care | Payer: Medicare Other | Source: Ambulatory Visit | Attending: Vascular Surgery | Admitting: Vascular Surgery

## 2013-03-09 ENCOUNTER — Encounter: Payer: Self-pay | Admitting: Vascular Surgery

## 2013-03-09 ENCOUNTER — Ambulatory Visit (INDEPENDENT_AMBULATORY_CARE_PROVIDER_SITE_OTHER): Payer: Medicare Other | Admitting: Vascular Surgery

## 2013-03-09 VITALS — BP 112/83 | HR 105 | Ht 70.0 in | Wt 184.0 lb

## 2013-03-09 DIAGNOSIS — Z48812 Encounter for surgical aftercare following surgery on the circulatory system: Secondary | ICD-10-CM

## 2013-03-09 DIAGNOSIS — M79609 Pain in unspecified limb: Secondary | ICD-10-CM | POA: Insufficient documentation

## 2013-03-09 DIAGNOSIS — I739 Peripheral vascular disease, unspecified: Secondary | ICD-10-CM

## 2013-03-09 NOTE — Progress Notes (Signed)
Subjective:     Patient ID: [name], male   DOB: 09-24-1950, 63 y.o.   MRN: [id number]  HPI this 63 year old male returns for continued followup regarding his left femoral-popliteal bypass graft and right iliac stent. He continues to have complaints of left leg pain in the medial thigh and knee area. He does not have classic claudication symptoms in the calves. He is able to ambulate several blocks.  Past Medical History  Diagnosis Date  . Type 2 diabetes mellitus   . Coronary atherosclerosis of native coronary artery     Nonobstrtuctive by catheterization 2007 and 2013  . PAD (peripheral artery disease)     s/p left fem-pop bypass 2007; PTA and stenting of right iliac artery 09/2011  . Rheumatoid arthritis(714.0)   . GERD (gastroesophageal reflux disease)   . Essential hypertension, benign   . Hyperlipidemia   . COPD (chronic obstructive pulmonary disease)   . Bronchitis   . Carotid artery occlusion   . Cervical spine disease     Neck surgery in 2007 with Dr. Joya Salm  . Atrial fibrillation   . Stroke   . DVT (deep venous thrombosis)     History  Substance Use Topics  . Smoking status: Former Smoker -- 0.10 packs/day for 40 years    Types: Cigarettes, Cigars    Quit date: 09/01/2011  . Smokeless tobacco: Never Used  . Alcohol Use: Yes     Comment: occasional beer    Family History  Problem Relation Age of Onset  . Heart attack Mother 26  . Deep vein thrombosis Mother   . Diabetes Mother   . Hyperlipidemia Mother   . Stroke Father   . Diabetes Father   . Hyperlipidemia Father   . Diabetes Sister   . Hyperlipidemia Sister   . Diabetes Brother   . Hyperlipidemia Brother   . Hyperlipidemia Son     No Known Allergies  Current outpatient prescriptions:atorvastatin (LIPITOR) 20 MG tablet, Take 20 mg by mouth daily., Disp: , Rfl: ;  celecoxib (CELEBREX) 200 MG capsule, Take 200 mg by mouth daily. , Disp: , Rfl: ;  clopidogrel (PLAVIX) 75 MG tablet, Take 1 tablet (75  mg total) by mouth daily., Disp: 30 tablet, Rfl: 0;  clopidogrel (PLAVIX) 75 MG tablet, Take 1 tablet (75 mg total) by mouth daily., Disp: 30 tablet, Rfl: 11 glipiZIDE (GLUCOTROL) 10 MG tablet, Take 10 mg by mouth 2 (two) times daily before a meal. , Disp: , Rfl: ;  HYDROcodone-acetaminophen (LORTAB) 7.5-500 MG per tablet, Take 1 tablet by mouth as needed. , Disp: , Rfl: ;  lisinopril (PRINIVIL,ZESTRIL) 20 MG tablet, Take 20 mg by mouth daily., Disp: , Rfl: ;  metFORMIN (GLUCOPHAGE) 500 MG tablet, Take 500 mg by mouth daily with breakfast. , Disp: , Rfl:  nitroGLYCERIN (NITROSTAT) 0.4 MG SL tablet, Place 1 tablet (0.4 mg total) under the tongue every 5 (five) minutes as needed for chest pain., Disp: 90 tablet, Rfl: 3;  omeprazole (PRILOSEC) 40 MG capsule, Take 40 mg by mouth daily., Disp: , Rfl: ;  oxyCODONE-acetaminophen (PERCOCET/ROXICET) 5-325 MG per tablet, Take 1 tablet by mouth every 6 (six) hours as needed. , Disp: , Rfl: ;  PROAIR HFA 108 (90 BASE) MCG/ACT inhaler, , Disp: , Rfl:  tadalafil (CIALIS) 5 MG tablet, Take 5 mg by mouth daily as needed. For erectile dysfunction., Disp: , Rfl: ;  traMADol (ULTRAM) 50 MG tablet, Take 50 mg by mouth every 6 (six) hours as needed. ,  Disp: , Rfl:   BP 112/83  Pulse 105  Ht 5\' 10"  (1.778 m)  Wt 184 lb (83.462 kg)  BMI 26.40 kg/m2  SpO2 95%  Body mass index is 26.4 kg/(m^2).           Review of Systems denies chest pain, dyspnea on exertion, PND, orthopnea.     Objective:   Physical Exam BP 112/83  Pulse 105  Ht 5\' 10"  (1.778 m)  Wt 184 lb (83.462 kg)  BMI 26.40 kg/m2  SpO2 95%  Gen. well-developed thin male in no apparent stress alert and oriented x3 Lungs no rhonchi or wheezing Left leg with 3+ femoral popliteal pulse palpable left foot well perfused. Right leg with 3+ femoral popliteal and posterior tibial pulse palpable. Today I ordered lower extremity arterial Dopplers which revealed triphasic flow in a widely patent left  femoral-popliteal bypass graftboth feet with ABI of 1.24 on the right 1.18 on the left 2     Assessment:     Doing well following redo left femoral-popliteal bypass graft and right iliac stent Left knee pain due to probable osteoarthritis     Plan:     Return in 6 months for duplex scan of left femoral-popliteal bypass graft and right iliac stent and ABIs

## 2013-03-09 NOTE — Addendum Note (Signed)
Addended by: Mena Goes on: 03/09/2013 03:30 PM   Modules accepted: Orders

## 2013-08-10 ENCOUNTER — Ambulatory Visit (INDEPENDENT_AMBULATORY_CARE_PROVIDER_SITE_OTHER): Payer: Medicare Other | Admitting: Cardiology

## 2013-08-10 VITALS — BP 113/67 | HR 103 | Ht 69.0 in | Wt 177.0 lb

## 2013-08-10 DIAGNOSIS — R0789 Other chest pain: Secondary | ICD-10-CM | POA: Diagnosis not present

## 2013-08-10 DIAGNOSIS — I429 Cardiomyopathy, unspecified: Secondary | ICD-10-CM

## 2013-08-10 MED ORDER — CELECOXIB 200 MG PO CAPS: 200.0000 mg | ORAL_CAPSULE | Freq: Every day | ORAL | Status: DC

## 2013-08-10 MED ORDER — IBUPROFEN 200 MG PO CAPS: 400.0000 mg | ORAL_CAPSULE | Freq: Two times a day (BID) | ORAL | Status: AC

## 2013-08-10 NOTE — Patient Instructions (Signed)
   Ibuprofen 400mg  twice a day  X 5 days only, then stop  Hold your Celebrex during this time. Continue all other medications.   Follow up Dr. Domenic Polite in the next 3 weeks

## 2013-08-10 NOTE — Progress Notes (Signed)
Clinical Summary [name] is a 63 y.o.male regular patient of Dr Domenic Polite, he is seen today as an add on patient for chest pain.  1. Chest pain - per notes history of atypical chest pain. Prior admits negative for ACS. Cath 2013 with non-obstructive disease, LVEF 45-50% by LVgram. Prior CT PEs negative.  - describes left sided chest pain/rib pain at the mid axillary line, squeezing like pain 7/10. Can sometimes be worst with movment, can be worst with deep breath. No other associated symptoms. Comes and goes, lasts few seconds to few minutes.  - reports seen in [place] [facility] twice over the last few months, and also by Dr [name] regarding pain with no significant abnormalities found - reports prior lawnmower accident several years ago, chest pain on and off since that time  - pain better with hydrocodone    Past Medical History  Diagnosis Date  . Type 2 diabetes mellitus   . Coronary atherosclerosis of native coronary artery     Nonobstrtuctive by catheterization 2007 and 2013  . PAD (peripheral artery disease)     s/p left fem-pop bypass 2007; PTA and stenting of right iliac artery 09/2011  . Rheumatoid arthritis(714.0)   . GERD (gastroesophageal reflux disease)   . Essential hypertension, benign   . Hyperlipidemia   . COPD (chronic obstructive pulmonary disease)   . Bronchitis   . Carotid artery occlusion   . Cervical spine disease     Neck surgery in 2007 with Dr. Joya Salm  . Atrial fibrillation   . Stroke   . DVT (deep venous thrombosis)      No Known Allergies   Current Outpatient Prescriptions  Medication Sig Dispense Refill  . atorvastatin (LIPITOR) 20 MG tablet Take 20 mg by mouth daily.      . celecoxib (CELEBREX) 200 MG capsule Take 200 mg by mouth daily.       . clopidogrel (PLAVIX) 75 MG tablet Take 1 tablet (75 mg total) by mouth daily.  30 tablet  0  . clopidogrel (PLAVIX) 75 MG tablet Take 1 tablet (75 mg total) by mouth daily.  30 tablet  11  . glipiZIDE  (GLUCOTROL) 10 MG tablet Take 10 mg by mouth 2 (two) times daily before a meal.       . HYDROcodone-acetaminophen (LORTAB) 7.5-500 MG per tablet Take 1 tablet by mouth as needed.       Marland Kitchen lisinopril (PRINIVIL,ZESTRIL) 20 MG tablet Take 20 mg by mouth daily.      . metFORMIN (GLUCOPHAGE) 500 MG tablet Take 500 mg by mouth daily with breakfast.       . nitroGLYCERIN (NITROSTAT) 0.4 MG SL tablet Place 1 tablet (0.4 mg total) under the tongue every 5 (five) minutes as needed for chest pain.  90 tablet  3  . omeprazole (PRILOSEC) 40 MG capsule Take 40 mg by mouth daily.      Marland Kitchen oxyCODONE-acetaminophen (PERCOCET/ROXICET) 5-325 MG per tablet Take 1 tablet by mouth every 6 (six) hours as needed.       Marland Kitchen PROAIR HFA 108 (90 BASE) MCG/ACT inhaler       . tadalafil (CIALIS) 5 MG tablet Take 5 mg by mouth daily as needed. For erectile dysfunction.      . traMADol (ULTRAM) 50 MG tablet Take 50 mg by mouth every 6 (six) hours as needed.        No current facility-administered medications for this visit.     Past Surgical History  Procedure Laterality Date  . Repair of left arm fracture    . 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 Surgery scrotal / testicular    . Femoral-popliteal bypass graft  11/06/2005  . Pr vein bypass graft,aorto-fem-pop  06-22-2010    Redo Left Fem-Pop  . Iliac artery stent  10-03-11    Right [facility] stenting     No Known Allergies    Family History  Problem Relation Age of Onset  . Heart attack Mother 34  . Deep vein thrombosis Mother   . Diabetes Mother   . Hyperlipidemia Mother   . Stroke Father   . Diabetes Father   . Hyperlipidemia Father   . Diabetes Sister   . Hyperlipidemia Sister   . Diabetes Brother   . Hyperlipidemia Brother   . Hyperlipidemia Son      Social History [name] reports that he quit smoking about 23 months ago. His smoking use included Cigarettes and Cigars. He has a 4 pack-year smoking history. He has never used smokeless tobacco. [name]  reports that he drinks alcohol.   Review of Systems CONSTITUTIONAL: No weight loss, fever, chills, weakness or fatigue.  HEENT: Eyes: No visual loss, blurred vision, double vision or yellow sclerae.No hearing loss, sneezing, congestion, runny nose or sore throat.  SKIN: No rash or itching.  CARDIOVASCULAR: per HPI RESPIRATORY: No shortness of breath, cough or sputum.  GASTROINTESTINAL: No anorexia, nausea, vomiting or diarrhea. No abdominal pain or blood.  GENITOURINARY: No burning on urination, no polyuria NEUROLOGICAL: No headache, dizziness, syncope, paralysis, ataxia, numbness or tingling in the extremities. No change in bowel or bladder control.  MUSCULOSKELETAL: No muscle, back pain, joint pain or stiffness.  LYMPHATICS: No enlarged nodes. No history of splenectomy.  PSYCHIATRIC: No history of depression or anxiety.  ENDOCRINOLOGIC: No reports of sweating, cold or heat intolerance. No polyuria or polydipsia.  Marland Kitchen   Physical Examination Pp 92 bp 113/67 Wt 177 lbs BMI 26 Gen: resting comfortably, no acute distress HEENT: no scleral icterus, pupils equal round and reactive, no palptable cervical adenopathy,  CV: RRR, no m/r/g, no JVD Resp: Clear to auscultation bilaterally GI: abdomen is soft, non-tender, non-distended, normal bowel sounds, no hepatosplenomegaly MSK: extremities are warm. Left chest/mid axillary pain somewhat reproducible on exam  Skin: warm, no rash Neuro:  no focal deficits Psych: appropriate affect   Diagnostic Studies 07/2011 Cath Hemodynamic Findings:  Central aortic pressure: 123/64  Left ventricular pressure: 117/12/12  Angiographic Findings:  Left main: No disease noted.  Left Anterior Descending Artery: Large vessel that courses to the apex. There are mild luminal irregularities in the mid vessel.  Circumflex Artery: Moderate sized vessel with no obstructive disease.  Right Coronary Artery: Moderate sized, co-dominant vessel with no obstructive  disease noted.  Left Ventricular Angiogram: LVEF 45-50%.  Impression:  1. Mild non-obstructive CAD  2. Low normal LV systolic function  3. Non-cardiac chest pain  Recommendations: Continue medical management. Aggressive risk factor reduction. Smoking cessation.  Complications: None. The patient tolerated the procedure well.   06/2011 Echo LVEF 50%   Assessment and Plan  1. Chest pain - long history of atypical chest pain per notes - symptoms today are atypical, EKG without ischemic changes. Sinus tach on EKG, manual check pulse in 92.  - pain somewhat reproducible on exam. Most consistent with MSK pain - will given 5 day course of ibuprofen 400mg  bid, hold celebrex while taking - f/u 3-4 weeks with primary cardiologist Dr Domenic Polite  for repeat evaluation      Arnoldo Lenis, M.D., F.A.C.C.

## 2013-08-16 ENCOUNTER — Telehealth: Payer: Self-pay | Admitting: Cardiology

## 2013-08-16 NOTE — Telephone Encounter (Signed)
[name] was seen in the ER [facility] today. Given RX for Motrin . [name] is concerned about taking Motrin .

## 2013-08-16 NOTE — Telephone Encounter (Signed)
Patient wanted to know if he needed to go back on ibuprofen that was prescribed by the ED doctor last night. Patient said he forgot to let the ED doctor know that he was currently finishing up a 5 day supply of ibuprofen that was prescribed by Dr. Harl Bowie. Patient informed that he should not restart ibuprofen and go back on his celebrex as suggested at his last office visit. Patient verbalized understanding of plan.

## 2013-08-16 NOTE — Telephone Encounter (Signed)
[name] returned your telephone call. He is home now.

## 2013-08-25 ENCOUNTER — Other Ambulatory Visit: Payer: Self-pay | Admitting: *Deleted

## 2013-08-25 DIAGNOSIS — I739 Peripheral vascular disease, unspecified: Secondary | ICD-10-CM

## 2013-08-25 DIAGNOSIS — Z48812 Encounter for surgical aftercare following surgery on the circulatory system: Secondary | ICD-10-CM

## 2013-08-31 ENCOUNTER — Other Ambulatory Visit (HOSPITAL_COMMUNITY): Payer: Medicare Other

## 2013-08-31 ENCOUNTER — Encounter (HOSPITAL_COMMUNITY): Payer: Medicare Other

## 2013-08-31 ENCOUNTER — Ambulatory Visit: Payer: Medicare Other | Admitting: Vascular Surgery

## 2013-09-02 ENCOUNTER — Ambulatory Visit: Payer: Medicare Other | Admitting: Cardiology

## 2013-09-07 ENCOUNTER — Ambulatory Visit: Payer: Medicare Other | Admitting: Cardiology

## 2013-09-15 ENCOUNTER — Encounter: Payer: Self-pay | Admitting: Family

## 2013-09-16 ENCOUNTER — Ambulatory Visit (HOSPITAL_COMMUNITY)
Admission: RE | Admit: 2013-09-16 | Discharge: 2013-09-16 | Disposition: A | Discharge status: Home / Self Care | Payer: Medicare Other | Source: Ambulatory Visit | Attending: Family | Admitting: Family

## 2013-09-16 ENCOUNTER — Encounter: Payer: Medicare Other | Admitting: Family

## 2013-09-16 ENCOUNTER — Ambulatory Visit (INDEPENDENT_AMBULATORY_CARE_PROVIDER_SITE_OTHER)
Admission: RE | Admit: 2013-09-16 | Discharge: 2013-09-16 | Disposition: A | Discharge status: Home / Self Care | Payer: Medicare Other | Source: Ambulatory Visit | Attending: Family | Admitting: Family

## 2013-09-16 DIAGNOSIS — I739 Peripheral vascular disease, unspecified: Secondary | ICD-10-CM

## 2013-09-16 DIAGNOSIS — M79609 Pain in unspecified limb: Secondary | ICD-10-CM | POA: Diagnosis not present

## 2013-09-16 DIAGNOSIS — Z95828 Presence of other vascular implants and grafts: Secondary | ICD-10-CM | POA: Insufficient documentation

## 2013-09-16 DIAGNOSIS — Z48812 Encounter for surgical aftercare following surgery on the circulatory system: Secondary | ICD-10-CM

## 2013-09-16 DIAGNOSIS — I10 Essential (primary) hypertension: Secondary | ICD-10-CM | POA: Diagnosis not present

## 2013-09-16 DIAGNOSIS — E119 Type 2 diabetes mellitus without complications: Secondary | ICD-10-CM | POA: Diagnosis not present

## 2013-09-17 ENCOUNTER — Encounter: Payer: Self-pay | Admitting: Vascular Surgery

## 2013-09-17 NOTE — Patient Instructions (Signed)
Dear Mr. Hartfield,  Your recent visit with our Vascular Lab Sept. 10, 2015  Lab studies indicate:   NO  Significant change noted when compared to the exam on March 09, 2013 and  Aug. 25, 2014.  Please follow up in ONE  YEAR .          Peripheral Vascular Disease Peripheral Vascular Disease (PVD), also called Peripheral Arterial Disease (PAD), is a circulation problem caused by cholesterol (atherosclerotic plaque) deposits in the arteries. PVD commonly occurs in the lower extremities (legs) but it can occur in other areas of the body, such as your arms. The cholesterol buildup in the arteries reduces blood flow which can cause pain and other serious problems. The presence of PVD can place a person at risk for Coronary Artery Disease (CAD).  CAUSES  Causes of PVD can be many. It is usually associated with more than one risk factor such as:   High Cholesterol.  Smoking.  Diabetes.  Lack of exercise or inactivity.  High blood pressure (hypertension).  Obesity.  Family history. SYMPTOMS   When the lower extremities are affected, patients with PVD may experience:  Leg pain with exertion or physical activity. This is called INTERMITTENT CLAUDICATION. This may present as cramping or numbness with physical activity. The location of the pain is associated with the level of blockage. For example, blockage at the abdominal level (distal abdominal aorta) may result in buttock or hip pain. Lower leg arterial blockage may result in calf pain.  As PVD becomes more severe, pain can develop with less physical activity.  In people with severe PVD, leg pain may occur at rest.  Other PVD signs and symptoms:  Leg numbness or weakness.  Coldness in the affected leg or foot, especially when compared to the other leg.  A change in leg color.  Patients with significant PVD are more prone to ulcers or sores on toes, feet or legs. These may take longer to heal or may reoccur. The ulcers or sores  can become infected.  If signs and symptoms of PVD are ignored, gangrene may occur. This can result in the loss of toes or loss of an entire limb.  Not all leg pain is related to PVD. Other medical conditions can cause leg pain such as:  Blood clots (embolism) or Deep Vein Thrombosis.  Inflammation of the blood vessels (vasculitis).  Spinal stenosis. DIAGNOSIS  Diagnosis of PVD can involve several different types of tests. These can include:  Pulse Volume Recording Method (PVR). This test is simple, painless and does not involve the use of X-rays. PVR involves measuring and comparing the blood pressure in the arms and legs. An ABI (Ankle-Brachial Index) is calculated. The normal ratio of blood pressures is 1. As this number becomes smaller, it indicates more severe disease.  < 0.95 - indicates significant narrowing in one or more leg vessels.  <0.8 - there will usually be pain in the foot, leg or buttock with exercise.  <0.4 - will usually have pain in the legs at rest.  <0.25 - usually indicates limb threatening PVD.  Doppler detection of pulses in the legs. This test is painless and checks to see if you have a pulses in your legs/feet.  A dye or contrast material (a substance that highlights the blood vessels so they show up on x-ray) may be given to help your caregiver better see the arteries for the following tests. The dye is eliminated from your body by the kidney's. Your caregiver  may order blood work to check your kidney function and other laboratory values before the following tests are performed:  Magnetic Resonance Angiography (MRA). An MRA is a picture study of the blood vessels and arteries. The MRA machine uses a large magnet to produce images of the blood vessels.  Computed Tomography Angiography (CTA). A CTA is a specialized x-ray that looks at how the blood flows in your blood vessels. An IV may be inserted into your arm so contrast dye can be injected.  Angiogram. Is  a procedure that uses x-rays to look at your blood vessels. This procedure is minimally invasive, meaning a small incision (cut) is made in your groin. A small tube (catheter) is then inserted into the artery of your groin. The catheter is guided to the blood vessel or artery your caregiver wants to examine. Contrast dye is injected into the catheter. X-rays are then taken of the blood vessel or artery. After the images are obtained, the catheter is taken out. TREATMENT  Treatment of PVD involves many interventions which may include:  Lifestyle changes:  Quitting smoking.  Exercise.  Following a low fat, low cholesterol diet.  Control of diabetes.  Foot care is very important to the PVD patient. Good foot care can help prevent infection.  Medication:  Cholesterol-lowering medicine.  Blood pressure medicine.  Anti-platelet drugs.  Certain medicines may reduce symptoms of Intermittent Claudication.  Interventional/Surgical options:  Angioplasty. An Angioplasty is a procedure that inflates a balloon in the blocked artery. This opens the blocked artery to improve blood flow.  Stent Implant. A wire mesh tube (stent) is placed in the artery. The stent expands and stays in place, allowing the artery to remain open.  Peripheral Bypass Surgery. This is a surgical procedure that reroutes the blood around a blocked artery to help improve blood flow. This type of procedure may be performed if Angioplasty or stent implants are not an option. SEEK IMMEDIATE MEDICAL CARE IF:   You develop pain or numbness in your arms or legs.  Your arm or leg turns cold, becomes blue in color.  You develop redness, warmth, swelling and pain in your arms or legs. MAKE SURE YOU:   Understand these instructions.  Will watch your condition.  Will get help right away if you are not doing well or get worse. Document Released: 02/01/2004 Document Revised: 03/18/2011 Document Reviewed: 12/29/2007 [facility]  Patient Information 2015 [place], [place]. This information is not intended to replace advice given to you by your health care provider. Make sure you discuss any questions you have with your health care provider.

## 2013-09-23 NOTE — Progress Notes (Signed)
Lab only 

## 2013-09-27 ENCOUNTER — Encounter: Payer: Self-pay | Admitting: Family

## 2013-09-27 ENCOUNTER — Telehealth: Payer: Self-pay

## 2013-09-27 NOTE — Telephone Encounter (Signed)
Phone call from pt.  Reported he has a dark area on the lateral left foot that is the approx. size of a nickel; he first noticed the dark area yesterday.  Stated he noticed the left foot was somewhat painful to walk on, for the past week.  Stated there is swelling in the area surrounding the dark spot.  Denies any open sore.  Questioned if he had any leg pain @ rest or with walking.  Stated there is some discomfort in his left calf with walking.  Reported that he was not able to complete his last appt. on 09/16/13, because he had to leave.  Stated he didn't get to see the Nurse Practitioner; had vascular studies done only.  Voiced concern about the pain/ discoloration in the left foot, and requested appt.  Appt. given for 9/22 @ 10:00 AM.  Agrees with plan.

## 2013-09-28 ENCOUNTER — Encounter: Payer: Self-pay | Admitting: Family

## 2013-09-28 ENCOUNTER — Ambulatory Visit (INDEPENDENT_AMBULATORY_CARE_PROVIDER_SITE_OTHER): Payer: Medicare Other | Admitting: Family

## 2013-09-28 VITALS — BP 121/83 | HR 80 | Temp 98.1°F | Resp 16 | Ht 70.0 in | Wt 184.0 lb

## 2013-09-28 DIAGNOSIS — L819 Disorder of pigmentation, unspecified: Secondary | ICD-10-CM

## 2013-09-28 DIAGNOSIS — M79605 Pain in left leg: Secondary | ICD-10-CM

## 2013-09-28 DIAGNOSIS — M7989 Other specified soft tissue disorders: Secondary | ICD-10-CM

## 2013-09-28 DIAGNOSIS — M79609 Pain in unspecified limb: Secondary | ICD-10-CM

## 2013-09-28 NOTE — Patient Instructions (Signed)

## 2013-09-28 NOTE — Progress Notes (Signed)
[facility] [place] HISTORY AND PHYSICAL -PAD  History of Present Illness [name] is a 63 y.o. male patient of Dr. Kellie Simmering and Dr. Bridgett Larsson who has a history of lower extremity occlusive disease. He previously had a left femoral-popliteal bypass graft by Dr. Kellie Simmering in 2007 and revision of the graft in June of 2012. He also had right common iliac PTA and stenting by Dr. Lurline Hare September 2013. He denies any claudication symptoms at the present time is only ambulating about one block. He has no history of cellulitis infection gangrene or rest pain.   He returns today with c/o left calf and foot pain 1-2 weeks, left lateral foot discoloration for 2 days, and swelling of his left leg for a week. He is concerned that this may be the beginning of a PAD related ulcer.  He denies claudication in thighs and calves with walking.  He denies hx of MI, positive for atrial fib. in the past.  He states that he was told that he had a "light stroke" in the remote past, pointed to behind hid right ear; no carotid Duplex on file, pt states no procedure would have helped or could be done.  Patient denies non healing ulcers on lower extremities.  Patient denies New Medical or Surgical History.   Pt Diabetic: Yes, states last A1C was 7.?  Pt smoker: quit October, 2013   Pt meds include:  Statin :Yes  ASA: No  Other anticoagulants/antiplatelets: Plavix    Past Medical History  Diagnosis Date  . Type 2 diabetes mellitus   . Coronary atherosclerosis of native coronary artery     Nonobstrtuctive by catheterization 2007 and 2013  . PAD (peripheral artery disease)     s/p left fem-pop bypass 2007; PTA and stenting of right iliac artery 09/2011  . Rheumatoid arthritis(714.0)   . GERD (gastroesophageal reflux disease)   . Essential hypertension, benign   . Hyperlipidemia   . COPD (chronic obstructive pulmonary disease)   . Bronchitis   . Carotid artery occlusion   . Cervical spine  disease     Neck surgery in 2007 with Dr. Joya Salm  . Atrial fibrillation   . Stroke   . DVT (deep venous thrombosis)     Social History History  Substance Use Topics  . Smoking status: Former Smoker -- 0.10 packs/day for 40 years    Types: Cigarettes, Cigars    Quit date: 09/01/2011  . Smokeless tobacco: Never Used  . Alcohol Use: Yes     Comment: occasional beer    Family History Family History  Problem Relation Age of Onset  . Heart attack Mother 58  . Deep vein thrombosis Mother   . Diabetes Mother   . Hyperlipidemia Mother   . Stroke Father   . Diabetes Father   . Hyperlipidemia Father   . Diabetes Sister   . Hyperlipidemia Sister   . Diabetes Brother   . Hyperlipidemia Brother   . Hyperlipidemia Son     Past Surgical History  Procedure Laterality Date  . Repair of left arm fracture    . 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 Surgery scrotal / testicular    . Femoral-popliteal bypass graft  11/06/2005  . Pr vein bypass graft,aorto-fem-pop  06-22-2010    Redo Left Fem-Pop  . Iliac artery stent  10-03-11    Right CIA stenting    No Known Allergies  Current Outpatient Prescriptions  Medication Sig Dispense Refill  . atorvastatin (LIPITOR)  20 MG tablet Take 20 mg by mouth daily.      . celecoxib (CELEBREX) 200 MG capsule Take 1 capsule (200 mg total) by mouth daily. (HOLD X 5 DAYS WHILE TAKING THE IBUPROFEN)      . clopidogrel (PLAVIX) 75 MG tablet Take 1 tablet (75 mg total) by mouth daily.  30 tablet  11  . DEXILANT 30 MG capsule Take 1 capsule by mouth daily.      Marland Kitchen glipiZIDE (GLUCOTROL) 10 MG tablet Take 5 mg by mouth 2 (two) times daily before a meal.       . HYDROcodone-acetaminophen (NORCO) 7.5-325 MG per tablet Take 1 tablet by mouth 2 (two) times daily as needed.      Marland Kitchen JANUVIA 100 MG tablet Take 1 tablet by mouth daily.      Marland Kitchen lisinopril (PRINIVIL,ZESTRIL) 20 MG tablet Take 20 mg by mouth daily.      . metFORMIN (GLUCOPHAGE) 500 MG tablet Take 1,000 mg by  mouth 2 (two) times daily with a meal.       . nitroGLYCERIN (NITROSTAT) 0.4 MG SL tablet Place 1 tablet (0.4 mg total) under the tongue every 5 (five) minutes as needed for chest pain.  90 tablet  3  . ONE TOUCH ULTRA TEST test strip       . PROAIR HFA 108 (90 BASE) MCG/ACT inhaler Inhale 2 puffs into the lungs every 4 (four) hours as needed.       . sildenafil (VIAGRA) 100 MG tablet Take 100 mg by mouth daily as needed for erectile dysfunction.      . VOLTAREN 1 % GEL Apply 1 application topically 2 (two) times daily as needed.       No current facility-administered medications for this visit.    ROS: See HPI for pertinent positives and negatives.   Physical Examination  Filed Vitals:   09/28/13 1032  BP: 121/83  Pulse: 80  Temp: 98.1 F (36.7 C)  TempSrc: Oral  Resp: 16  Height: 5\' 10"  (1.778 m)  Weight: 184 lb (83.462 kg)  SpO2: 96%   Body mass index is 26.4 kg/(m^2).  General: A&O x 3, WDWN,  Gait: normal  Eyes: PERRLA,  Pulmonary: CTAB, without wheezes , rales or rhonchi  Cardiac: regular Rythm , without detected murmur   Carotid Bruits  Left  Right    Negative  Negative    Aorta: not palpable  Radial pulses: 2+ palpable.   VASCULAR EXAM:  Extremities without ischemic changes  without Gangrene; without open wounds, no swelling at either knee, no edema, no redness nor streaking.   LE Pulses  LEFT  RIGHT   FEMORAL  2+palpable  2+palpable   POPLITEAL  not palpable  not  palpable   POSTERIOR TIBIAL  2+ palpable  2+ palpable   DORSALIS PEDIS  ANTERIOR TIBIAL  1+palpable  2+palpable    Abdomen: soft, NT, no masses palpated.  Skin: no rashes, no ulcers noted.  Musculoskeletal: no muscle wasting or atrophy. Bony prominence at lateral aspect left foot with mild dark discoloration, no abrasion, no open wound.  Neurologic: A&O X 3; Appropriate Affect ; SENSATION: normal; MOTOR FUNCTION: moving all extremities equally, motor strength 5/5 throughout. Speech is  fluent/normal. CN 2-12 intact except hard of hearing (he thinks he has wax in his ears).    Non-Invasive Vascular Imaging: DATE: 09/16/13 [facility] - ILIAC DUPLEX EVALUATION    INDICATION: Iliac stent    PREVIOUS INTERVENTION(S): Right common iliac artery stent  on 10/02/11; Redo left femoral to above knee popliteal bypass graft on 06/22/10    DUPLEX EXAM:      Peak Systolic Velocity (cm/s)  AORTA - Proximal 38  AORTA - Mid 63  AORTA - Distal 63    RIGHT  LEFT  Peak Systolic Velocity (cm/s) Ratio (if abnormal) Waveform  Peak Systolic Velocity (cm/s) Ratio (if abnormal) Waveform  113  T Common Iliac Artery - Proximal     121  T Common Iliac Artery - Mid     Not Visualized   Common Iliac Artery - Distal     105  T External Iliac Artery - Proximal     88  T External Iliac Artery - Mid     85  T External Iliac Artery - Distal     Not Visualized    Internal Iliac Artery     1.25 Today's ABI / TBI 1.16  1.24 Previous ABI / TBI (03/09/13 ) 1.18    Waveform:    M - Monophasic       B - Biphasic       T - Triphasic  If Ankle Brachial Index (ABI) or Toe Brachial Index (TBI) performed, please see complete report     ADDITIONAL FINDINGS: Decreased visualization of the abdominal vasculature and right common iliac artery stent due to overlying bowel gas.     IMPRESSION: 1. Doppler velocities and waveforms suggest a patent right common iliac artery stent with no hemodynamically significant stenosis of the right iliac arteries noted, based on limited visualization, as described above. 2. Comparison to the previous exam is noted on the second page of this report.       Compared to the previous exam:  Obtained velocities of the right common iliac artery appear less than when compared to the previous exam on 08/31/12.        LOWER EXTREMITY ARTERIAL DUPLEX EVALUATION    INDICATION: Follow up bypass graft     PREVIOUS INTERVENTION(S): Redo left femoral to above knee popliteal bypass graft on  06/22/10; Right common iliac artery stent on 10/02/11    DUPLEX EXAM:     RIGHT  LEFT   Peak Systolic Velocity (cm/s) Ratio (if abnormal) Waveform  Peak Systolic Velocity (cm/s) Ratio (if abnormal) Waveform     Inflow Artery 72  T     Proximal Anastomosis 69  T     Proximal Graft 50  T     Mid Graft 46  T      Distal Graft 54  T     Distal Anastomosis 53  T     Outflow Artery 52  T  1.25 Today's ABI / TBI 1.16  1.24 Previous ABI / TBI (03/09/2013) 1.18    Waveform:    M - Monophasic       B - Biphasic       T - Triphasic  If Ankle Brachial Index (ABI) or Toe Brachial Index (TBI) performed, please see complete report     ADDITIONAL FINDINGS: No internal vessel narrowing noted within the bypass graft or anastomosis.    IMPRESSION: Patent left leg bypass graft with no evidence of stenosis noted.    Compared to the previous exam:  No significant change noted when compared to the exam on 03/09/13.     ASSESSMENT: [name] is a 63 y.o. male who is s/p right common iliac artery stent on 10/02/11, and redo left femoral to above knee popliteal bypass graft on  06/22/10. He presents with c/o mild dark discoloration of bony prominence at lateral aspect of his left foot. This appears to be where his shoe rubs, no abrasion, no ulcer. He recently exercised more than usual and his left foot and calf started hurting intermittently after this. Bilateral ABI's are normal. Duplex indicates that eft leg bypass graft is patent with no evidence of stenosis. Doppler velocities and waveforms suggest a patent right common iliac artery stent with no hemodynamically significant stenosis of the right iliac arteries noted, based on limited visualization, as described above. He has no evidence of lower extremity arterial compromise.  There is no left calf swelling or tenderness, no evidence of DVT, he has continued regular lower extremity exercises and has not had periods of inactivity. He states that he has a  podiatrist with whom he will make an appointment if the mild swelling over the bony prominence of his left foot does not resolve. Will add carotid Duplex when he returns in a year since he states he had a mild stroke in the past, not recently, and no carotid Duplex results are on file.  PLAN:  I discussed in depth with the patient the nature of atherosclerosis, and emphasized the importance of maximal medical management including strict control of blood pressure, blood glucose, and lipid levels, obtaining regular exercise, and continued cessation of smoking.  The patient is aware that without maximal medical management the underlying atherosclerotic disease process will progress, limiting the benefit of any interventions.  Based on the patient's vascular studies and examination, pt will return to clinic in 1 year, which already may be scheduled, for ABI's, aorto-iliac Duplex, left LE arterial Duplex, and carotid Duplex.  The patient was given information about PAD including signs, symptoms, treatment, what symptoms should prompt the patient to seek immediate medical care, and risk reduction measures to take.  Clemon Chambers, RN, MSN, FNP-C [facility] Phone: [phone]  Clinic MD: Kellie Simmering  09/28/2013 10:45 AM

## 2013-12-07 ENCOUNTER — Other Ambulatory Visit: Payer: Self-pay | Admitting: *Deleted

## 2013-12-07 DIAGNOSIS — I739 Peripheral vascular disease, unspecified: Secondary | ICD-10-CM

## 2013-12-07 MED ORDER — CLOPIDOGREL BISULFATE 75 MG PO TABS: 75.0000 mg | ORAL_TABLET | Freq: Every day | ORAL | Status: DC

## 2013-12-16 ENCOUNTER — Encounter (HOSPITAL_COMMUNITY): Payer: Self-pay | Admitting: Vascular Surgery

## 2014-03-02 ENCOUNTER — Ambulatory Visit (HOSPITAL_COMMUNITY)
Admission: RE | Admit: 2014-03-02 | Discharge: 2014-03-02 | Disposition: A | Discharge status: Home / Self Care | Payer: Medicaid Other | Source: Ambulatory Visit | Attending: Family | Admitting: Family

## 2014-03-02 ENCOUNTER — Ambulatory Visit (INDEPENDENT_AMBULATORY_CARE_PROVIDER_SITE_OTHER): Payer: Medicare Other | Admitting: Family

## 2014-03-02 ENCOUNTER — Encounter: Payer: Self-pay | Admitting: Family

## 2014-03-02 ENCOUNTER — Telehealth: Payer: Self-pay

## 2014-03-02 ENCOUNTER — Other Ambulatory Visit (HOSPITAL_COMMUNITY): Payer: Medicaid Other

## 2014-03-02 VITALS — BP 133/88 | HR 101 | Resp 18 | Ht 70.0 in | Wt 189.0 lb

## 2014-03-02 DIAGNOSIS — Z95828 Presence of other vascular implants and grafts: Secondary | ICD-10-CM

## 2014-03-02 DIAGNOSIS — Z87891 Personal history of nicotine dependence: Secondary | ICD-10-CM

## 2014-03-02 DIAGNOSIS — E1159 Type 2 diabetes mellitus with other circulatory complications: Secondary | ICD-10-CM

## 2014-03-02 DIAGNOSIS — M79605 Pain in left leg: Secondary | ICD-10-CM

## 2014-03-02 DIAGNOSIS — Z9889 Other specified postprocedural states: Secondary | ICD-10-CM

## 2014-03-02 DIAGNOSIS — I739 Peripheral vascular disease, unspecified: Secondary | ICD-10-CM

## 2014-03-02 DIAGNOSIS — E1151 Type 2 diabetes mellitus with diabetic peripheral angiopathy without gangrene: Secondary | ICD-10-CM

## 2014-03-02 DIAGNOSIS — M25562 Pain in left knee: Secondary | ICD-10-CM | POA: Diagnosis not present

## 2014-03-02 DIAGNOSIS — M25569 Pain in unspecified knee: Secondary | ICD-10-CM | POA: Insufficient documentation

## 2014-03-02 NOTE — Telephone Encounter (Addendum)
Phone call from pt.  Reported he has had increased pain in the left leg x 1 wk.  Stated the pain occurs with activity and with rest.  Denies any open sores of left LE.  Reported there is no swelling of the left LE.  Stated it appears the left calf is slightly bigger than the right calf.  Also reported the left great and 2nd toe appears darker than it has been.  Also, reported the left leg feels weaker, intermittently; described that when he stoops or squats down, he doesn't have much strength in the left leg to stand upright.  Stated the left leg feels heavier at times.  Advised will discuss with provider, and call back to schedule an appt.  Agrees w/ plan.

## 2014-03-02 NOTE — Telephone Encounter (Signed)
Discussed pt's symptoms with Dr. Kellie Simmering.  Recommended to schedule pt. for Left Lower Extremity Arterial Duplex to r/o occluded BPG, and appt. with Nurse Practitioner, ASAP, today.  Pt. notified of Dr. Evelena Leyden recommendations.  Agreed and will proceed to the office for a 1:30 PM appt.

## 2014-03-02 NOTE — Patient Instructions (Signed)

## 2014-03-02 NOTE — Progress Notes (Signed)
[facility] [place] HISTORY AND PHYSICAL -PAD  History of Present Illness [name] is a 64 y.o. male patient of Dr. Kellie Simmering and Dr. Bridgett Larsson who has a history of lower extremity occlusive disease. He previously had a left femoral-popliteal bypass graft by Dr. Kellie Simmering in 2007 and revision of the graft in June of 2012. He also had right common iliac PTA and stenting by Dr. Lurline Hare September 2013.  He returns today with c/o left thigh and calf pain for a few days, worse at night and sitting, less with walking, denies non healing wounds. He is still riding his stationary bike regularly.   He denies hx of MI, positive for atrial fib. in the past.  He states that he was told that he had a "light stroke" in the remote past, pointed to behind hid right ear; no carotid Duplex on file, pt states no procedure would have helped or could be done.  Patient denies New Medical or Surgical History.   Pt Diabetic: Yes, states last A1C was 7.?, states uncontrolled at times  Pt smoker: quit October, 2013   Pt meds include:  Statin :Yes  ASA: No  Other anticoagulants/antiplatelets: Plavix    Past Medical History  Diagnosis Date  . Type 2 diabetes mellitus   . Coronary atherosclerosis of native coronary artery     Nonobstrtuctive by catheterization 2007 and 2013  . PAD (peripheral artery disease)     s/p left fem-pop bypass 2007; PTA and stenting of right iliac artery 09/2011  . Rheumatoid arthritis(714.0)   . GERD (gastroesophageal reflux disease)   . Essential hypertension, benign   . Hyperlipidemia   . COPD (chronic obstructive pulmonary disease)   . Bronchitis   . Carotid artery occlusion   . Cervical spine disease     Neck surgery in 2007 with Dr. Joya Salm  . Atrial fibrillation   . Stroke   . DVT (deep venous thrombosis)     Social History History  Substance Use Topics  . Smoking status: Former Smoker -- 0.10 packs/day for 40 years    Types: Cigarettes, Cigars   Quit date: 09/01/2011  . Smokeless tobacco: Never Used  . Alcohol Use: Yes     Comment: occasional beer    Family History Family History  Problem Relation Age of Onset  . Heart attack Mother 17  . Deep vein thrombosis Mother   . Diabetes Mother   . Hyperlipidemia Mother   . Stroke Father   . Diabetes Father   . Hyperlipidemia Father   . Diabetes Sister   . Hyperlipidemia Sister   . Diabetes Brother   . Hyperlipidemia Brother   . Hyperlipidemia Son     Past Surgical History  Procedure Laterality Date  . Repair of left arm fracture    . 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 Surgery scrotal / testicular    . Femoral-popliteal bypass graft  11/06/2005  . Pr vein bypass graft,aorto-fem-pop  06-22-2010    Redo Left Fem-Pop  . Iliac artery stent  10-03-11    Right CIA stenting  . Abdominal aortagram N/A 10/03/2011    Procedure: ABDOMINAL Maxcine Ham;  Surgeon: Conrad Kula, MD;  Location: [facility] CATH LAB;  Service: Cardiovascular;  Laterality: N/A;    No Known Allergies  Current Outpatient Prescriptions  Medication Sig Dispense Refill  . atorvastatin (LIPITOR) 20 MG tablet Take 20 mg by mouth daily.    . celecoxib (CELEBREX) 200 MG capsule Take 1 capsule (200  mg total) by mouth daily. (HOLD X 5 DAYS WHILE TAKING THE IBUPROFEN)    . clopidogrel (PLAVIX) 75 MG tablet Take 1 tablet (75 mg total) by mouth daily. 30 tablet 11  . DEXILANT 30 MG capsule Take 1 capsule by mouth daily.    Marland Kitchen glipiZIDE (GLUCOTROL) 10 MG tablet Take 5 mg by mouth 2 (two) times daily before a meal.     . HYDROcodone-acetaminophen (NORCO) 7.5-325 MG per tablet Take 1 tablet by mouth 2 (two) times daily as needed.    Marland Kitchen JANUVIA 100 MG tablet Take 1 tablet by mouth daily.    Marland Kitchen lisinopril (PRINIVIL,ZESTRIL) 20 MG tablet Take 20 mg by mouth daily.    . metFORMIN (GLUCOPHAGE) 500 MG tablet Take 1,000 mg by mouth 2 (two) times daily with a meal.     . nitroGLYCERIN (NITROSTAT) 0.4 MG SL tablet Place 1 tablet (0.4 mg total)  under the tongue every 5 (five) minutes as needed for chest pain. 90 tablet 3  . ONE TOUCH ULTRA TEST test strip     . PROAIR HFA 108 (90 BASE) MCG/ACT inhaler Inhale 2 puffs into the lungs every 4 (four) hours as needed.     . sildenafil (VIAGRA) 100 MG tablet Take 100 mg by mouth daily as needed for erectile dysfunction.    . VOLTAREN 1 % GEL Apply 1 application topically 2 (two) times daily as needed.     No current facility-administered medications for this visit.    ROS: See HPI for pertinent positives and negatives.   Physical Examination Filed Vitals:   03/02/14 1353  BP: 133/88  Pulse: 101  Resp: 18  Height: 5\' 10"  (1.778 m)  Weight: 189 lb (85.73 kg)  SpO2: 94%   Body mass index is 27.12 kg/(m^2).   General: A&O x 3, WDWN,  Gait: normal  Eyes: PERRLA,  Pulmonary: CTAB, without wheezes , rales or rhonchi  Cardiac: regular Rythm , without detected murmur   Carotid Bruits  Left  Right    Negative  Negative    Aorta: not palpable  Radial pulses: 2+ palpable.   VASCULAR EXAM:  Extremities without ischemic changes  without Gangrene; without open wounds, no swelling at either knee, no edema, no redness nor streaking.   LE Pulses  LEFT  RIGHT   FEMORAL  2+palpable  2+palpable   POPLITEAL  not palpable  not  palpable   POSTERIOR TIBIAL  1+ palpable  2+ palpable   DORSALIS PEDIS  ANTERIOR TIBIAL  Faintly palpable  2+palpable    Abdomen: soft, NT, no masses palpated.  Skin: no rashes, no ulcers.  Musculoskeletal: no muscle wasting or atrophy, no abrasion, no open wounds.  Neurologic: A&O X 3; Appropriate Affect,  MOTOR FUNCTION: moving all extremities equally, motor strength 5/5 throughout. Speech is fluent/normal. CN 2-12 intact except hard of hearing.           Non-Invasive Vascular Imaging: DATE: 03/02/2014 LOWER EXTREMITY ARTERIAL DUPLEX EVALUATION    INDICATION: Follow up bypass graft     PREVIOUS INTERVENTION(S):  Redo of left femoral to above knee popliteal bypass graft on 06/22/10; Right common iliac artery stent on 10/02/11    DUPLEX EXAM:     RIGHT  LEFT   Peak Systolic Velocity (cm/s) Ratio (if abnormal) Waveform  Peak Systolic Velocity (cm/s) Ratio (if abnormal) Waveform     Inflow Artery 89  T     Proximal Anastomosis 112  T     Proximal Graft 62  T     Mid Graft 48  T      Distal Graft 56  T     Distal Anastomosis 41  T     Outflow Artery 55  T   Today's ABI / TBI   1.25 Previous ABI / TBI (09/16/13 ) 1.16    Waveform:    M - Monophasic       B - Biphasic       T - Triphasic  If Ankle Brachial Index (ABI) or Toe Brachial Index (TBI) performed, please see complete report     ADDITIONAL FINDINGS: No internal vessel narrowing noted within the bypass graft or anastomosis.    IMPRESSION: Patent left leg bypass graft with no evidence of stenosis noted.    Compared to the previous exam:  No significant change noted when compared to the exam on 09/16/13.     ASSESSMENT: Haedyn Ancrum is a 64 y.o. male who is s/p left femoral-popliteal bypass graft by Dr. Kellie Simmering in 2007 and revision of the graft in June of 2012. He also had right common iliac PTA and stenting by Dr. Lurline Hare September 2013.  His left thigh and calf pain is worse with lying and sitting, and less painful with walking; this pain is not due to lack of arterial perfusion as his left leg Doppler waveforms are all triphasic, no ABI's performed nor needed today. Today's left LE arterial Duplex reveals no internal vessel narrowing noted within the bypass graft or anastomosis. Patent left leg bypass graft with no evidence of stenosis noted. No significant change noted when compared to the exam on 09/16/13. I spoke with Dr. Kellie Simmering about pt's Duplex results, HPI, and physical exam.   PLAN:  I discussed in depth with the patient the nature of atherosclerosis, and emphasized the importance of maximal medical management including strict  control of blood pressure, blood glucose, and lipid levels, obtaining regular exercise, and continued cessation of smoking.  The patient is aware that without maximal medical management the underlying atherosclerotic disease process will progress, limiting the benefit of any interventions.  Based on the patient's vascular studies and examination, pt will return to clinic in as already scheduled in September 2016 for ABI's, aorto-iliac Duplex, left LE arterial Duplex, and carotid Duplex.  The patient was given information about PAD including signs, symptoms, treatment, what symptoms should prompt the patient to seek immediate medical care, and risk reduction measures to take.  Clemon Chambers, RN, MSN, FNP-C [facility] Phone: [phone]  Clinic MD: Kellie Simmering on call  03/02/2014 1:47 PM

## 2014-05-13 ENCOUNTER — Encounter: Payer: Self-pay | Admitting: *Deleted

## 2014-05-16 ENCOUNTER — Encounter: Payer: Self-pay | Admitting: Cardiology

## 2014-05-16 ENCOUNTER — Ambulatory Visit (INDEPENDENT_AMBULATORY_CARE_PROVIDER_SITE_OTHER): Payer: Medicare Other | Admitting: Cardiology

## 2014-05-16 VITALS — BP 137/89 | HR 72 | Ht 70.0 in | Wt 186.8 lb

## 2014-05-16 DIAGNOSIS — E785 Hyperlipidemia, unspecified: Secondary | ICD-10-CM | POA: Diagnosis not present

## 2014-05-16 DIAGNOSIS — R0789 Other chest pain: Secondary | ICD-10-CM

## 2014-05-16 DIAGNOSIS — I2510 Atherosclerotic heart disease of native coronary artery without angina pectoris: Secondary | ICD-10-CM

## 2014-05-16 DIAGNOSIS — I10 Essential (primary) hypertension: Secondary | ICD-10-CM | POA: Diagnosis not present

## 2014-05-16 NOTE — Addendum Note (Signed)
Addended by: Merlene Laughter on: 05/16/2014 09:43 AM   Modules accepted: Level of Service

## 2014-05-16 NOTE — Patient Instructions (Signed)
Your physician recommends that you continue on your current medications as directed. Please refer to the Current Medication list given to you today. Your physician recommends that you schedule a follow-up appointment in: 6 months. You will receive a reminder letter in the mail in about 4 months reminding you to call and schedule your appointment. If you don't receive this letter, please contact our office. 

## 2014-05-16 NOTE — Progress Notes (Signed)
Cardiology Office Note  Date: 05/16/2014   ID: Antero Derosia, DOB 14-May-1950, MRN [id number]  PCP: [name]., MD  Primary Cardiologist: Rozann Lesches, MD   Chief Complaint  Patient presents with  . Coronary Artery Disease  . PAD  . Hyperlipidemia    History of Present Illness: [name] is a 64 y.o. male most recently seen in the office by Dr. Harl Bowie back in August 2015. At that time he was reporting atypical chest pain that was somewhat reproducible on examination suggesting possible musculoskeletal component. He was treated with a course of anti-inflammatory agents.  He has a history of nonobstructive CAD as outlined below, cardiac catheterization in July 2013 demonstrated only mild coronary atherosclerosis.  Visit with [facility] noted in February of this year. He has a history of left femoral-popliteal bypass graft by Dr. Kellie Simmering in 2007 with revision of the graft in June 2012. He also had right common iliac PTA and stenting by Dr. Bridgett Larsson in September 2013. He had reassuring follow-up lower extremity arterial Dopplers without evidence of significant restenosis.  Today he reports no major progressing chest pain symptoms or shortness of breath. He does continue to have atypical, musculoskeletal sounding thoracic pains at times, no clear exertional precipitant. Follow-up ECG today is outlined below.  We are requesting his most recent lipid panel from Dr. [name].;   Past Medical History  Diagnosis Date  . Type 2 diabetes mellitus   . Coronary atherosclerosis of native coronary artery     Nonobstrtuctive by catheterization 2007 and 2013  . PAD (peripheral artery disease)     s/p left fem-pop bypass 2007; PTA and stenting of right iliac artery 09/2011  . Rheumatoid arthritis(714.0)   . GERD (gastroesophageal reflux disease)   . Essential hypertension, benign   . Hyperlipidemia   . COPD (chronic obstructive pulmonary disease)   . Bronchitis   . Carotid artery occlusion   .  Cervical spine disease     Neck surgery in 2007 with Dr. Joya Salm  . Atrial fibrillation   . Stroke   . DVT (deep venous thrombosis)     Past Surgical History  Procedure Laterality Date  . Repair of left arm fracture    . 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 Surgery scrotal / testicular    . Femoral-popliteal bypass graft  11/06/2005  . Pr vein bypass graft,aorto-fem-pop  06-22-2010    Redo Left Fem-Pop  . Iliac artery stent  10-03-11    Right CIA stenting  . Abdominal aortagram N/A 10/03/2011    Procedure: ABDOMINAL Maxcine Ham;  Surgeon: Conrad Ridgely, MD;  Location: [facility] CATH LAB;  Service: Cardiovascular;  Laterality: N/A;    Current Outpatient Prescriptions  Medication Sig Dispense Refill  . atorvastatin (LIPITOR) 20 MG tablet Take 20 mg by mouth daily.    . celecoxib (CELEBREX) 200 MG capsule Take 1 capsule (200 mg total) by mouth daily. (HOLD X 5 DAYS WHILE TAKING THE IBUPROFEN)    . clopidogrel (PLAVIX) 75 MG tablet Take 1 tablet (75 mg total) by mouth daily. 30 tablet 11  . DEXILANT 30 MG capsule Take 1 capsule by mouth daily.    Marland Kitchen glipiZIDE (GLUCOTROL) 10 MG tablet Take 5 mg by mouth 2 (two) times daily before a meal.     . HYDROcodone-acetaminophen (NORCO) 7.5-325 MG per tablet Take 1 tablet by mouth 2 (two) times daily as needed.    Marland Kitchen JANUVIA 100 MG tablet Take 1 tablet by mouth daily.    Marland Kitchen  lisinopril (PRINIVIL,ZESTRIL) 20 MG tablet Take 20 mg by mouth daily.    . metFORMIN (GLUCOPHAGE) 500 MG tablet Take 1,000 mg by mouth 2 (two) times daily with a meal.     . nitroGLYCERIN (NITROSTAT) 0.4 MG SL tablet Place 1 tablet (0.4 mg total) under the tongue every 5 (five) minutes as needed for chest pain. 90 tablet 3  . ONE TOUCH ULTRA TEST test strip     . PROAIR HFA 108 (90 BASE) MCG/ACT inhaler Inhale 2 puffs into the lungs every 4 (four) hours as needed.     . sildenafil (VIAGRA) 100 MG tablet Take 100 mg by mouth daily as needed for erectile dysfunction.    . VOLTAREN 1 % GEL Apply 1  application topically as needed.      No current facility-administered medications for this visit.    Allergies:  Review of patient's allergies indicates no known allergies.   Social History: The patient  reports that he quit smoking about 2 years ago. His smoking use included Cigarettes and Cigars. He has a 4 pack-year smoking history. He has never used smokeless tobacco. He reports that he drinks alcohol. He reports that he does not use illicit drugs.   ROS:  Please see the history of present illness. Otherwise, complete review of systems is positive for chronic leg pains and lower back pain, decreased energy.  All other systems are reviewed and negative.   Physical Exam: VS:  BP 137/89 mmHg  Pulse 72  Ht 5\' 10"  (1.778 m)  Wt 186 lb 12.8 oz (84.732 kg)  BMI 26.80 kg/m2  SpO2 96%, BMI Body mass index is 26.8 kg/(m^2).  Wt Readings from Last 3 Encounters:  05/16/14 186 lb 12.8 oz (84.732 kg)  03/02/14 189 lb (85.73 kg)  09/28/13 184 lb (83.462 kg)     General: Patient appears comfortable at rest. HEENT: Conjunctiva and lids normal, oropharynx clear. Neck: Supple, no elevated JVP or carotid bruits, no thyromegaly. Lungs: Clear to auscultation, nonlabored breathing at rest. Cardiac: Regular rate and rhythm, S4, no significant systolic murmur, no pericardial rub. Abdomen: Soft, nontender, bowel sounds present, no guarding or rebound. Extremities: No pitting edema, DPs diminished bilaterally. Skin: Warm and dry. Musculoskeletal: No kyphosis. Neuropsychiatric: Alert and oriented x3, affect grossly appropriate.   ECG: ECG is ordered today and reviewed showing sinus arrhythmia with nonspecific T-wave flattening.  Recent Labwork:  08/15/2013: BUN 16, creatinine 0.8, AST 26, ALT 22, potassium 3.8, hemoglobin 12.1, platelets 200.  Assessment and Plan:  1. History of atypical chest pain, no major progressing pattern. ECG is nonspecific.  2. History of mild coronary atherosclerosis  by cardiac catheterizations, most recently 2013. No clear indication for follow-up ischemic testing at this time. Would continue medical therapy and observation. He is on Plavix, Lipitor, and lisinopril.  3. Hyperlipidemia, on Lipitor. Requesting most recent lipid panel from Dr. [name].  Current medicines were reviewed with the patient today.   Orders Placed This Encounter  Procedures  . EKG 12-Lead    Disposition: FU with me in 6 months.   Signed, Satira Sark, MD, [facility] 05/16/2014 8:58 AM    [place] [facility] [place], [place], [place] [address] Phone: [phone]; Fax: [phone]

## 2014-09-05 ENCOUNTER — Other Ambulatory Visit: Payer: Self-pay | Admitting: *Deleted

## 2014-09-05 DIAGNOSIS — I6523 Occlusion and stenosis of bilateral carotid arteries: Secondary | ICD-10-CM

## 2014-09-09 ENCOUNTER — Telehealth: Payer: Self-pay

## 2014-09-09 NOTE — Telephone Encounter (Signed)
Phone call from pt.  Reported recently went fishing and received several bug bites.  Noticed a swollen area with a red spot in the center of it, approx. quarter-sized, on the left groin, bypass incision.  Reported he also has a few areas on back of left leg, that look like bug bites.  Questioned if the area on the left groin was related to his bypass?  Denied any pain in left leg.  Denied any open sores of left LE.  Denied any numbness or cold sensation of left LE.   Denied fever/ chills.  Advised to contact his PCP to evaluate recent bug bites.  Verb. Understanding.

## 2014-09-21 ENCOUNTER — Encounter: Payer: Self-pay | Admitting: Family

## 2014-09-23 ENCOUNTER — Ambulatory Visit: Payer: Medicare Other | Admitting: Family

## 2014-09-23 ENCOUNTER — Ambulatory Visit (INDEPENDENT_AMBULATORY_CARE_PROVIDER_SITE_OTHER)
Admission: RE | Admit: 2014-09-23 | Discharge: 2014-09-23 | Disposition: A | Discharge status: Home / Self Care | Payer: Medicare Other | Source: Ambulatory Visit | Attending: Family | Admitting: Family

## 2014-09-23 ENCOUNTER — Other Ambulatory Visit (HOSPITAL_COMMUNITY): Payer: Medicare Other

## 2014-09-23 ENCOUNTER — Encounter: Payer: Self-pay | Admitting: Family

## 2014-09-23 ENCOUNTER — Ambulatory Visit (HOSPITAL_COMMUNITY)
Admission: RE | Admit: 2014-09-23 | Discharge: 2014-09-23 | Disposition: A | Discharge status: Home / Self Care | Payer: Medicare Other | Source: Ambulatory Visit | Attending: Family | Admitting: Family

## 2014-09-23 ENCOUNTER — Ambulatory Visit (INDEPENDENT_AMBULATORY_CARE_PROVIDER_SITE_OTHER): Payer: Medicare Other | Admitting: Family

## 2014-09-23 ENCOUNTER — Other Ambulatory Visit: Payer: Self-pay | Admitting: Vascular Surgery

## 2014-09-23 ENCOUNTER — Encounter (HOSPITAL_COMMUNITY): Payer: Medicare Other

## 2014-09-23 VITALS — BP 140/89 | HR 57 | Temp 97.8°F | Resp 16 | Ht 70.0 in | Wt 183.0 lb

## 2014-09-23 DIAGNOSIS — E1159 Type 2 diabetes mellitus with other circulatory complications: Secondary | ICD-10-CM | POA: Diagnosis not present

## 2014-09-23 DIAGNOSIS — I6523 Occlusion and stenosis of bilateral carotid arteries: Secondary | ICD-10-CM

## 2014-09-23 DIAGNOSIS — E1151 Type 2 diabetes mellitus with diabetic peripheral angiopathy without gangrene: Secondary | ICD-10-CM

## 2014-09-23 DIAGNOSIS — I739 Peripheral vascular disease, unspecified: Secondary | ICD-10-CM

## 2014-09-23 DIAGNOSIS — I745 Embolism and thrombosis of iliac artery: Secondary | ICD-10-CM

## 2014-09-23 DIAGNOSIS — Z48812 Encounter for surgical aftercare following surgery on the circulatory system: Secondary | ICD-10-CM | POA: Insufficient documentation

## 2014-09-23 DIAGNOSIS — Z9889 Other specified postprocedural states: Secondary | ICD-10-CM

## 2014-09-23 DIAGNOSIS — Z95828 Presence of other vascular implants and grafts: Secondary | ICD-10-CM | POA: Diagnosis not present

## 2014-09-23 DIAGNOSIS — Z4889 Encounter for other specified surgical aftercare: Secondary | ICD-10-CM | POA: Diagnosis not present

## 2014-09-23 DIAGNOSIS — Z87891 Personal history of nicotine dependence: Secondary | ICD-10-CM

## 2014-09-23 NOTE — Patient Instructions (Signed)

## 2014-09-23 NOTE — Progress Notes (Signed)
[facility] HISTORY AND PHYSICAL -PAD  History of Present Illness [name] is a 64 y.o. male patient of Dr. Kellie Simmering and Dr. Bridgett Larsson who has a history of lower extremity occlusive disease. He previously had a left femoral-popliteal bypass graft by Dr. Kellie Simmering in 2007 and revision of the graft in June of 2012. He also had right common iliac PTA and stenting by Dr. Lurline Hare September 2013.  He returns today with c/o left thigh and calf pain for a few days, worse at night and sitting, less with walking, denies non healing wounds. He is still riding his stationary bike regularly.   He denies hx of MI, positive for atrial fib. in the past.  He states that he was told that he had a "light stroke" in the remote past, pointed to behind hid right ear; no carotid Duplex on file, pt states no procedure would have helped or could be done.  He was seen in [facility] ED recently and found to have C7 issues, is wearing a cervical collar.   Pt Diabetic: Yes, states last A1C was 8.1, increased from 7.1 Pt smoker: quit October, 2013   Pt meds include:  Statin :Yes  ASA: No  Other anticoagulants/antiplatelets: Plavix      Past Medical History  Diagnosis Date  . Type 2 diabetes mellitus   . Coronary atherosclerosis of native coronary artery     Nonobstrtuctive by catheterization 2007 and 2013  . PAD (peripheral artery disease)     s/p left fem-pop bypass 2007; PTA and stenting of right iliac artery 09/2011  . Rheumatoid arthritis(714.0)   . GERD (gastroesophageal reflux disease)   . Essential hypertension, benign   . Hyperlipidemia   . COPD (chronic obstructive pulmonary disease)   . Bronchitis   . Carotid artery occlusion   . Cervical spine disease     Neck surgery in 2007 with Dr. Joya Salm  . Atrial fibrillation   . Stroke   . DVT (deep venous thrombosis)     Social History Social History  Substance Use Topics  . Smoking status: Former Smoker -- 0.10  packs/day for 40 years    Types: Cigarettes, Cigars    Quit date: 09/01/2011  . Smokeless tobacco: Never Used  . Alcohol Use: 0.0 oz/week    0 Standard drinks or equivalent per week     Comment: occasional beer    Family History Family History  Problem Relation Age of Onset  . Heart attack Mother 22  . Deep vein thrombosis Mother   . Diabetes Mother   . Hyperlipidemia Mother   . Hypertension Mother   . Peripheral vascular disease Mother   . Stroke Father   . Diabetes Father   . Hyperlipidemia Father   . Hypertension Father   . Peripheral vascular disease Father   . Diabetes Sister   . Hyperlipidemia Sister   . Hypertension Sister   . Diabetes Brother   . Hyperlipidemia Brother   . Cancer Brother   . Deep vein thrombosis Brother   . Hypertension Brother   . Peripheral vascular disease Brother   . Hyperlipidemia Son   . Cancer Son   . Hypertension Son     Past Surgical History  Procedure Laterality Date  . Repair of left arm fracture    . 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 Surgery scrotal / testicular    . Femoral-popliteal bypass graft  11/06/2005  . Pr vein bypass graft,aorto-fem-pop  06-22-2010  Redo Left Fem-Pop  . Iliac artery stent  10-03-11    Right CIA stenting  . Abdominal aortagram N/A 10/03/2011    Procedure: ABDOMINAL Maxcine Ham;  Surgeon: Conrad Hildreth, MD;  Location: [facility] CATH LAB;  Service: Cardiovascular;  Laterality: N/A;    No Known Allergies  Current Outpatient Prescriptions  Medication Sig Dispense Refill  . atorvastatin (LIPITOR) 20 MG tablet Take 20 mg by mouth daily.    . celecoxib (CELEBREX) 200 MG capsule Take 1 capsule (200 mg total) by mouth daily. (HOLD X 5 DAYS WHILE TAKING THE IBUPROFEN)    . clopidogrel (PLAVIX) 75 MG tablet Take 1 tablet (75 mg total) by mouth daily. 30 tablet 11  . DEXILANT 30 MG capsule Take 1 capsule by mouth daily.    . diazepam (VALIUM) 5 MG tablet Take 5 mg by mouth every 12 (twelve) hours as needed for anxiety.     Marland Kitchen glipiZIDE (GLUCOTROL) 10 MG tablet Take 5 mg by mouth 2 (two) times daily before a meal.     . HYDROcodone-acetaminophen (NORCO) 7.5-325 MG per tablet Take 1 tablet by mouth 2 (two) times daily as needed.    Marland Kitchen JANUVIA 100 MG tablet Take 1 tablet by mouth daily.    Marland Kitchen lisinopril (PRINIVIL,ZESTRIL) 20 MG tablet Take 20 mg by mouth daily.    . metFORMIN (GLUCOPHAGE) 500 MG tablet Take 1,000 mg by mouth 2 (two) times daily with a meal.     . nitroGLYCERIN (NITROSTAT) 0.4 MG SL tablet Place 1 tablet (0.4 mg total) under the tongue every 5 (five) minutes as needed for chest pain. 90 tablet 3  . ONE TOUCH ULTRA TEST test strip     . PROAIR HFA 108 (90 BASE) MCG/ACT inhaler Inhale 2 puffs into the lungs every 4 (four) hours as needed.     . sildenafil (VIAGRA) 100 MG tablet Take 100 mg by mouth daily as needed for erectile dysfunction.    . VOLTAREN 1 % GEL Apply 1 application topically as needed.      No current facility-administered medications for this visit.    ROS: See HPI for pertinent positives and negatives.   Physical Examination  Filed Vitals:   09/23/14 1008  BP: 140/89  Pulse: 57  Temp: 97.8 F (36.6 C)  Resp: 16  Height: 5\' 10"  (1.778 m)  Weight: 183 lb (83.008 kg)  SpO2: 97%   Body mass index is 26.26 kg/(m^2).  General: A&O x 3, WDWN,  Gait: normal  Eyes: PERRLA,  Pulmonary: CTAB, without wheezes , rales or rhonchi  Cardiac: regular Rythm, without detected murmur   Carotid Bruits  Left  Right    Negative  Negative    Aorta: not palpable  Radial pulses: 2+ palpable.   VASCULAR EXAM:  Extremities without ischemic changes  without Gangrene; without open wounds, no swelling at either knee, no edema, no redness nor streaking.   LE Pulses  LEFT  RIGHT   FEMORAL  2+palpable  2+palpable   POPLITEAL  not palpable  not  palpable   POSTERIOR TIBIAL  1+ palpable  2+ palpable   DORSALIS PEDIS  ANTERIOR TIBIAL   Faintly palpable  2+palpable    Abdomen: soft, NT, no masses palpated.  Skin: no rashes, no ulcers.  Musculoskeletal: no muscle wasting or atrophy, no abrasion, no open wounds.  Neurologic: A&O X 3; Appropriate Affect, MOTOR FUNCTION: moving all extremities equally, motor strength 5/5 throughout. Speech is fluent/normal. CN 2-12 intact except hard of hearing.  Non-Invasive Vascular Imaging: DATE: 09/23/2014   CEREBROVASCULAR DUPLEX EVALUATION    INDICATION: Carotid artery disease     PREVIOUS INTERVENTION(S): No carotid intervention.    DUPLEX EXAM:     RIGHT  LEFT  Peak Systolic Velocities (cm/s) End Diastolic Velocities (cm/s) Plaque LOCATION Peak Systolic Velocities (cm/s) End Diastolic Velocities (cm/s) Plaque  73 14  CCA PROXIMAL 80 20   78 17  CCA MID 123 29   65 16 HT CCA DISTAL 90 15 HT  49 8 HT ECA 70 5 HT  68 18 HT ICA PROXIMAL 43 13 HT  70 16  ICA MID 51 14   61 17  ICA DISTAL 88 33     0.89 ICA / CCA Ratio (PSV) 0.71  Antegrade  Vertebral Flow Antegrade   660 Brachial Systolic Pressure (mmHg) 630  Multiphasic (Subclavian artery) Brachial Artery Waveforms Multiphasic (Subclavian artery)    Plaque Morphology:  HM = Homogeneous, HT = Heterogeneous, CP = Calcific Plaque, SP = Smooth Plaque, IP = Irregular Plaque     ADDITIONAL FINDINGS:     IMPRESSION: Bilateral internal carotid artery velocities suggest a <40% stenosis.     Compared to the previous exam:  No prior exam performed at this facility for comparison.     ILIAC ARTERY STENT EVALUATION    INDICATION: Iliac stent evaluation    PREVIOUS INTERVENTION(S): Redo of left femoral to above knee popliteal bypass graft 06/22/2010. Right common iliac artery stent placement 10/02/2011.    DUPLEX EXAM:     RIGHT  LEFT   Peak Systolic Velocity (cm/s) Ratio (if abnormal) Waveform  Peak Systolic Velocity (cm/s) Ratio (if abnormal) Waveform  51   Aorta - Distal        Artery - Proximal  to Stent     180   Stent - Proximal     85   Stent - Mid     107   Stent - Distal     91   Artery - Distal to Stent     [place] Today's ABI / TBI 1.11  1.25 Previous ABI / TBI (  ) 1.16    Waveform:    M - Monophasic       B - Biphasic       T - Triphasic  If Ankle Brachial Index (ABI) or Toe Brachial Index (TBI) performed, please see complete report     ADDITIONAL FINDINGS: Somewhat limited visualization of the abdominal vasculature due to overlying bowel gas.    IMPRESSION: Patent right common iliac artery stent without evidence of stenosis.    Compared to the previous exam:  No significant change in comparison to the last exam.     LOWER EXTREMITY ARTERIAL DUPLEX EVALUATION    INDICATION: Peripheral vascular disease     PREVIOUS INTERVENTION(S): Redo of left femoral to above knee popliteal bypass graft 06/22/2010. Right common iliac artery stent 10/02/2011.    DUPLEX EXAM:     RIGHT  LEFT   Peak Systolic Velocity (cm/s) Ratio (if abnormal) Waveform  Peak Systolic Velocity (cm/s) Ratio (if abnormal) Waveform     Inflow Artery 76  T     Proximal Anastomosis 83  T     Proximal Graft 48  T     Mid Graft 50  T      Distal Graft 68  T     Distal Anastomosis 48  B     Outflow Artery 42  B   [place] Today's  ABI / TBI                                 1.11  1.25 Previous ABI / TBI (  ) 1.16    Waveform:    M - Monophasic       B - Biphasic       T - Triphasic  If Ankle Brachial Index (ABI) or Toe Brachial Index (TBI) performed, please see complete report  ADDITIONAL FINDINGS:     IMPRESSION: Patent left lower extremity bypass graft with no evidence of restenosis.     Compared to the previous exam:  No significant change in comparison to the last exam.      ASSESSMENT: Jaze Rodino is a 64 y.o. male who is s/p left femoral-popliteal bypass graft by Dr. Kellie Simmering in 2007 and revision of the graft in June of 2012. He also had right common iliac PTA and stenting by Dr. Lurline Hare September  2013. Pt reports remote history of TIA.  Today's carotid duplex suggests minimal bilateral ICA stenoses.  Today's right iliac stent duplex suggests a patent right common iliac artery stent without evidence of stenosis.No significant change in comparison to the last exam.   Today's left LE arterial duplex suggests a patent left lower extremity bypass graft with no evidence of restenosis. No significant change in comparison to the last exam.  His left thigh and calf pain is worse with lying and sitting, and less painful with walking; this pain is not due to lack of arterial perfusion as his left leg    PLAN:  Graduated walking program and work closely with his DM provider to get his DM under as good control as possible.  Based on the patient's vascular studies and examination, pt will return to clinic in 1 year with carotid Duplex, ABI's, right iliac artery duplex, and left LE arterial duplex.  Pt knows to notify us if he has concerns about the circulation in his legs.  I discussed in depth with the patient the nature of atherosclerosis, and emphasized the importance of maximal medical management including strict control of blood pressure, blood glucose, and lipid levels, obtaining regular exercise, and continued cessation of smoking.  The patient is aware that without maximal medical management the underlying atherosclerotic disease process will progress, limiting the benefit of any interventions.  The patient was given information about PAD including signs, symptoms, treatment, what symptoms should prompt the patient to seek immediate medical care, and risk reduction measures to take.  Clemon Chambers, RN, MSN, FNP-C [facility] Phone: [phone]  Clinic MD: Bridgett Larsson  09/23/2014 10:39 AM

## 2014-11-22 ENCOUNTER — Encounter: Payer: Self-pay | Admitting: Cardiology

## 2014-11-22 ENCOUNTER — Ambulatory Visit (INDEPENDENT_AMBULATORY_CARE_PROVIDER_SITE_OTHER): Payer: Medicare Other | Admitting: Cardiology

## 2014-11-22 VITALS — BP 148/98 | HR 93 | Ht 70.0 in | Wt 180.0 lb

## 2014-11-22 DIAGNOSIS — R42 Dizziness and giddiness: Secondary | ICD-10-CM

## 2014-11-22 DIAGNOSIS — Z8679 Personal history of other diseases of the circulatory system: Secondary | ICD-10-CM

## 2014-11-22 DIAGNOSIS — I739 Peripheral vascular disease, unspecified: Secondary | ICD-10-CM

## 2014-11-22 DIAGNOSIS — I2510 Atherosclerotic heart disease of native coronary artery without angina pectoris: Secondary | ICD-10-CM

## 2014-11-22 MED ORDER — LISINOPRIL 10 MG PO TABS: 10.0000 mg | ORAL_TABLET | Freq: Every day | ORAL | Status: DC

## 2014-11-22 NOTE — Patient Instructions (Signed)
Your physician has recommended you make the following change in your medication:  Decrease your lisinopril to 10 mg daily. Please break your 20 mg tablet in half daily until they are finished. Continue all other medications the same. Your physician has requested that you have an echocardiogram. Echocardiography is a painless test that uses sound waves to create images of your heart. It provides your doctor with information about the size and shape of your heart and how well your heart's chambers and valves are working. This procedure takes approximately one hour. There are no restrictions for this procedure. Your physician has requested that you regularly monitor and record your blood pressure readings at home. Please use the same machine at the same time of day to check your readings and record them to bring to your follow-up visit. Your physician recommends that you schedule a follow-up appointment in: 1 month.

## 2014-11-22 NOTE — Progress Notes (Signed)
Cardiology Office Note  Date: 11/22/2014   ID: [name], DOB 31-Aug-1950, MRN [id number]  PCP: [name]., MD  Primary Cardiologist: Rozann Lesches, MD   Chief Complaint  Patient presents with  . Coronary Artery Disease  . Cardiomyopathy    History of Present Illness: [name] is a 64 y.o. male last seen in May. He presents for a follow-up visit. Main complaint is of significant neck pain. He had an MRI done back in October at [facility] showing cervical disc disease in addition to previous cervical fusion surgery. He states that he is seeing Dr. Carloyn Manner at this time.  From a cardiac perspective he reports no angina, has stable NYHA class II dyspnea. He tells me that he has noticed that his blood pressure is low at times, shows me a systolic of 98 on one occasion. He sometimes gives dizzy when he stands up, he has had no palpitations or syncope however.  Last echocardiogram was in 2013 at [place]. He remains on Lipitor, lisinopril, and Plavix.  We reviewed his medications which are outlined below.  He continues to follow with [facility], last seen in September. He previously had a left femoral-popliteal bypass graft by Dr. Kellie Simmering in 2007 and revision of the graft in June of 2012. He also had right common iliac PTA and stenting by Dr. Lurline Hare September 2013. He had follow-up arterial Doppler studies at that time that showed minimal bilateral ICA stenoses, patent right common iliac artery stent, and patent left lower extremity bypass graft.   Past Medical History  Diagnosis Date  . Type 2 diabetes mellitus ([place])   . Coronary atherosclerosis of native coronary artery     Nonobstrtuctive by catheterization 2007 and 2013  . PAD (peripheral artery disease) ([place])     s/p left fem-pop bypass 2007; PTA and stenting of right iliac artery 09/2011  . Rheumatoid arthritis(714.0)   . GERD (gastroesophageal reflux disease)   . Essential hypertension, benign   . Hyperlipidemia   . COPD  (chronic obstructive pulmonary disease) ([place])   . Bronchitis   . Carotid artery occlusion   . Cervical spine disease     Neck surgery in 2007 with Dr. Joya Salm  . DVT (deep venous thrombosis) [facility])     Past Surgical History  Procedure Laterality Date  . Repair of left arm fracture    . 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 Surgery scrotal / testicular    . Femoral-popliteal bypass graft  11/06/2005  . Pr vein bypass graft,aorto-fem-pop  06-22-2010    Redo Left Fem-Pop  . Iliac artery stent  10-03-11    Right CIA stenting  . Abdominal aortagram N/A 10/03/2011    Procedure: ABDOMINAL Maxcine Ham;  Surgeon: Conrad Lawler, MD;  Location: [facility] CATH LAB;  Service: Cardiovascular;  Laterality: N/A;    Current Outpatient Prescriptions  Medication Sig Dispense Refill  . atorvastatin (LIPITOR) 20 MG tablet Take 20 mg by mouth daily.    . celecoxib (CELEBREX) 200 MG capsule Take 1 capsule (200 mg total) by mouth daily. (HOLD X 5 DAYS WHILE TAKING THE IBUPROFEN)    . clopidogrel (PLAVIX) 75 MG tablet Take 1 tablet (75 mg total) by mouth daily. 30 tablet 11  . DEXILANT 30 MG capsule Take 1 capsule by mouth daily.    . diazepam (VALIUM) 5 MG tablet Take 5 mg by mouth every 12 (twelve) hours as needed for anxiety.    Marland Kitchen glipiZIDE (GLUCOTROL) 10 MG tablet Take 5 mg  by mouth 2 (two) times daily before a meal.     . HYDROcodone-acetaminophen (NORCO) 7.5-325 MG per tablet Take 1 tablet by mouth 2 (two) times daily as needed.    Marland Kitchen JANUVIA 100 MG tablet Take 1 tablet by mouth daily.    . metFORMIN (GLUCOPHAGE) 500 MG tablet Take 1,000 mg by mouth 2 (two) times daily with a meal.     . nitroGLYCERIN (NITROSTAT) 0.4 MG SL tablet Place 1 tablet (0.4 mg total) under the tongue every 5 (five) minutes as needed for chest pain. 90 tablet 3  . ONE TOUCH ULTRA TEST test strip     . PROAIR HFA 108 (90 BASE) MCG/ACT inhaler Inhale 2 puffs into the lungs every 4 (four) hours as needed.     . sildenafil (VIAGRA) 100 MG tablet  Take 100 mg by mouth daily as needed for erectile dysfunction.    . VOLTAREN 1 % GEL Apply 1 application topically as needed.     Marland Kitchen lisinopril (PRINIVIL,ZESTRIL) 10 MG tablet Take 1 tablet (10 mg total) by mouth daily. 90 tablet 3   No current facility-administered medications for this visit.    Allergies:  Review of patient's allergies indicates no known allergies.   Social History: The patient  reports that he quit smoking about 3 years ago. His smoking use included Cigarettes and Cigars. He has a 4 pack-year smoking history. He has never used smokeless tobacco. He reports that he drinks alcohol. He reports that he does not use illicit drugs.   ROS:  Please see the history of present illness. Otherwise, complete review of systems is positive for chronic neck pain and stiffness.  All other systems are reviewed and negative.   Physical Exam: VS:  BP 148/98 mmHg  Pulse 93  Ht 5\' 10"  (1.778 m)  Wt 180 lb (81.647 kg)  BMI 25.83 kg/m2  SpO2 93%, BMI Body mass index is 25.83 kg/(m^2).  Wt Readings from Last 3 Encounters:  11/22/14 180 lb (81.647 kg)  09/23/14 183 lb (83.008 kg)  05/16/14 186 lb 12.8 oz (84.732 kg)     General: Patient appears comfortable at rest. HEENT: Conjunctiva and lids normal, oropharynx clear. Neck: No elevated JVP or carotid bruits, no thyromegaly. Lungs: Clear to auscultation, nonlabored breathing at rest. Cardiac: Regular rate and rhythm, S4, no significant systolic murmur, no pericardial rub. Abdomen: Soft, nontender, bowel sounds present, no guarding or rebound. Extremities: No pitting edema, DPs diminished bilaterally. Skin: Warm and dry. Musculoskeletal: No kyphosis. Neuropsychiatric: Alert and oriented x3, affect grossly appropriate.   ECG: Tracing from 05/16/2014 showed sinus arrhythmia with nonspecific T-wave changes.  Recent Labwork:  April 2016: Hemoglobin A1c 7.1, cholesterol 147, triglycerides 73, HDL 46, LDL 86.  Other Studies Reviewed  Today:  Echocardiogram 06/26/2011 [facility]) reported LVEF 50% with mild hypokinesis of the inferolateral wall, mildly sclerotic aortic valve with trivial aortic regurgitation, trivial tricuspid regurgitation, no pericardial effusion.  Cardiac catheterization 08/02/2011: Angiographic Findings:  Left main: No disease noted.   Left Anterior Descending Artery: Large vessel that courses to the apex. There are mild luminal irregularities in the mid vessel.   Circumflex Artery: Moderate sized vessel with no obstructive disease.   Right Coronary Artery: Moderate sized, co-dominant vessel with no obstructive disease noted.   Left Ventricular Angiogram: LVEF 45-50%.    Assessment and Plan:  1. History of nonobstructive CAD based on cardiac catheterization from 2013. He does not report any active angina symptoms. Plan is to continue medical therapy which  includes Plavix and Lipitor at this point.  2. Intermittent dizziness and reported mild hypotension. He checks blood pressure periodically at home. Some of his symptoms sound orthostatic as well. Plan will be to reduce lisinopril to 10 mg daily. He will check his blood pressure and record it daily for now.  3. History of cardiomyopathy with mild reduction in LVEF as of 2013. Follow-up echocardiogram will be obtained as this will also be helpful in further titrating his medications.  4. Peripheral arterial disease, stable as per recent follow-up with [facility]. Recent Doppler studies reviewed.  5. Cervical spine disease with chronic pain. He is following with Dr. Carloyn Manner at this time.  Current medicines were reviewed with the patient today.   Orders Placed This Encounter  Procedures  . ECHOCARDIOGRAM COMPLETE    Disposition: FU with me in 1 month.   Signed, Satira Sark, MD, [facility] 11/22/2014 10:05 AM    [facility] at [place], [place],  [address] Phone: [phone]; Fax: [phone]

## 2014-11-24 ENCOUNTER — Ambulatory Visit (INDEPENDENT_AMBULATORY_CARE_PROVIDER_SITE_OTHER): Payer: Medicare Other

## 2014-11-24 ENCOUNTER — Other Ambulatory Visit: Payer: Self-pay

## 2014-11-24 ENCOUNTER — Telehealth: Payer: Self-pay | Admitting: *Deleted

## 2014-11-24 DIAGNOSIS — I2510 Atherosclerotic heart disease of native coronary artery without angina pectoris: Secondary | ICD-10-CM

## 2014-11-24 DIAGNOSIS — Z8679 Personal history of other diseases of the circulatory system: Secondary | ICD-10-CM

## 2014-11-24 NOTE — Telephone Encounter (Signed)
-----   Message from Satira Sark, MD sent at 11/24/2014  2:33 PM EST ----- Reviewed report. Please let him know that LVEF is now normal range at 60-65%. This shows improvement compared to his prior evaluation.

## 2014-11-24 NOTE — Telephone Encounter (Signed)
Patient informed. 

## 2014-12-20 ENCOUNTER — Other Ambulatory Visit: Payer: Self-pay | Admitting: *Deleted

## 2014-12-20 DIAGNOSIS — I739 Peripheral vascular disease, unspecified: Secondary | ICD-10-CM

## 2014-12-20 MED ORDER — CLOPIDOGREL BISULFATE 75 MG PO TABS: 75.0000 mg | ORAL_TABLET | Freq: Every day | ORAL | Status: DC

## 2014-12-23 ENCOUNTER — Encounter: Payer: Self-pay | Admitting: Cardiology

## 2014-12-23 ENCOUNTER — Ambulatory Visit (INDEPENDENT_AMBULATORY_CARE_PROVIDER_SITE_OTHER): Payer: Medicare Other | Admitting: Cardiology

## 2014-12-23 VITALS — BP 128/78 | HR 53 | Ht 70.0 in | Wt 189.0 lb

## 2014-12-23 DIAGNOSIS — Z8679 Personal history of other diseases of the circulatory system: Secondary | ICD-10-CM | POA: Diagnosis not present

## 2014-12-23 DIAGNOSIS — I2510 Atherosclerotic heart disease of native coronary artery without angina pectoris: Secondary | ICD-10-CM | POA: Diagnosis not present

## 2014-12-23 DIAGNOSIS — I10 Essential (primary) hypertension: Secondary | ICD-10-CM

## 2014-12-23 NOTE — Progress Notes (Signed)
Cardiology Office Note  Date: 12/23/2014   ID: [name], DOB 06/11/1950, MRN [id number]  PCP: [name]., MD  Primary Cardiologist: Rozann Lesches, MD   Chief Complaint  Patient presents with  . Cardiomyopathy    History of Present Illness: [name] is a 64 y.o. male last seen in November. He presents for a follow-up visit. At the last visit we reduced his lisinopril to 10 mg daily. He brought in records of his blood pressure from home, systolics are generally in the 120 to 130 range. He has had less dizziness with standing. Reports stable NYHA class II dyspnea, no chest pain.  Interval follow-up echocardiogram was obtained showing normalization of LVEF, now on the range of 60-65%.we discussed these results today, and plan to continue medical therapy and observation.  Past Medical History  Diagnosis Date  . Type 2 diabetes mellitus ([place])   . Coronary atherosclerosis of native coronary artery     Nonobstrtuctive by catheterization 2007 and 2013  . PAD (peripheral artery disease) ([place])     s/p left fem-pop bypass 2007; PTA and stenting of right iliac artery 09/2011  . Rheumatoid arthritis(714.0)   . GERD (gastroesophageal reflux disease)   . Essential hypertension, benign   . Hyperlipidemia   . COPD (chronic obstructive pulmonary disease) ([place])   . Bronchitis   . Carotid artery occlusion   . Cervical spine disease     Neck surgery in 2007 with Dr. Joya Salm  . DVT (deep venous thrombosis) ([place])     Current Outpatient Prescriptions  Medication Sig Dispense Refill  . atorvastatin (LIPITOR) 20 MG tablet Take 20 mg by mouth daily.    . celecoxib (CELEBREX) 200 MG capsule Take 1 capsule (200 mg total) by mouth daily. (HOLD X 5 DAYS WHILE TAKING THE IBUPROFEN)    . clopidogrel (PLAVIX) 75 MG tablet Take 1 tablet (75 mg total) by mouth daily. 30 tablet 11  . DEXILANT 30 MG capsule Take 1 capsule by mouth daily.    . diazepam (VALIUM) 5 MG tablet Take 5 mg by mouth  every 12 (twelve) hours as needed for anxiety.    Marland Kitchen glipiZIDE (GLUCOTROL) 10 MG tablet Take 5 mg by mouth 2 (two) times daily before a meal.     . HYDROcodone-acetaminophen (NORCO) 7.5-325 MG per tablet Take 1 tablet by mouth 2 (two) times daily as needed.    Marland Kitchen JANUVIA 100 MG tablet Take 1 tablet by mouth daily.    Marland Kitchen lisinopril (PRINIVIL,ZESTRIL) 10 MG tablet Take 1 tablet (10 mg total) by mouth daily. 90 tablet 3  . metFORMIN (GLUCOPHAGE) 500 MG tablet Take 1,000 mg by mouth 2 (two) times daily with a meal.     . nitroGLYCERIN (NITROSTAT) 0.4 MG SL tablet Place 1 tablet (0.4 mg total) under the tongue every 5 (five) minutes as needed for chest pain. 90 tablet 3  . ONE TOUCH ULTRA TEST test strip     . PROAIR HFA 108 (90 BASE) MCG/ACT inhaler Inhale 2 puffs into the lungs every 4 (four) hours as needed.     . sildenafil (VIAGRA) 100 MG tablet Take 100 mg by mouth daily as needed for erectile dysfunction.    . VOLTAREN 1 % GEL Apply 1 application topically as needed.      No current facility-administered medications for this visit.   Allergies:  Review of patient's allergies indicates no known allergies.   Social History: The patient  reports that he quit smoking about 3  years ago. His smoking use included Cigarettes and Cigars. He has a 4 pack-year smoking history. He has never used smokeless tobacco. He reports that he drinks alcohol. He reports that he does not use illicit drugs.   ROS:  Please see the history of present illness. Otherwise, complete review of systems is positive for chronic neck pain.  All other systems are reviewed and negative.   Physical Exam: VS:  BP 128/78 mmHg  Pulse 53  Ht 5\' 10"  (1.778 m)  Wt 189 lb (85.73 kg)  BMI 27.12 kg/m2  SpO2 98%, BMI Body mass index is 27.12 kg/(m^2).  Wt Readings from Last 3 Encounters:  12/23/14 189 lb (85.73 kg)  11/22/14 180 lb (81.647 kg)  09/23/14 183 lb (83.008 kg)    General: Patient appears comfortable at rest. HEENT:  Conjunctiva and lids normal, oropharynx clear. Neck: No elevated JVP or carotid bruits, no thyromegaly. Lungs: Clear to auscultation, nonlabored breathing at rest. Cardiac: Regular rate and rhythm, S4, no significant systolic murmur, no pericardial rub. Abdomen: Soft, nontender, bowel sounds present, no guarding or rebound. Extremities: No pitting edema, DPs diminished bilaterally.  ECG: Tracing from 05/16/2014 showed sinus arrhythmia with nonspecific ST-T changes.  Recent Labwork:  April 2016: Hemoglobin A1c 7.1, cholesterol 147, triglycerides 73, HDL 46, LDL 86.  Other Studies Reviewed Today:  Echocardiogram 11/24/2014: Study Conclusions  - Left ventricle: The cavity size was normal. Wall thickness was normal. Systolic function was normal. The estimated ejection fraction was in the range of 60% to 65%. Doppler parameters are consistent with abnormal left ventricular relaxation (grade 1 diastolic dysfunction). - Aortic valve: Mildly calcified annulus. Trileaflet; mildly thickened leaflets. There was mild regurgitation. The AI VC is 0.3 cm. Valve area (VTI): 2.18 cm^2. Valve area (Vmax): 1.73 cm^2. Valve area (Vmean): 2.35 cm^2. Regurgitation pressure half-time: 546 ms. - Mitral valve: Mildly calcified annulus. Mildly thickened leaflets . - Atrial septum: No defect or patent foramen ovale was identified. - Technically difficult study.  Assessment and Plan:  1. History of cardiomyopathy with normalization of LVEF on medical therapy by recent follow-up echocardiogram. No changes made to current regimen. He is symptomatically stable at this time.  2. Nonobstructive CAD by cardiac catheterization in 2013. No active angina symptoms.  3. Essential hypertension. Lisinopril dose was cut back at the last visit. Continue current regimen. Encouraged to recheck blood pressure if he feels orthostatic dizziness.  Current medicines were reviewed with the patient  today.  Disposition: FU with me in 6 months.   Signed, Satira Sark, MD, [facility] 12/23/2014 8:55 AM    [facility] at [place], [place], [place] [address] Phone: [phone]; Fax: [phone]

## 2014-12-23 NOTE — Patient Instructions (Signed)
Your physician recommends that you continue on your current medications as directed. Please refer to the Current Medication list given to you today. Your physician recommends that you schedule a follow-up appointment in: 6 months. You will receive a reminder letter in the mail in about 4 months reminding you to call and schedule your appointment. If you don't receive this letter, please contact our office. 

## 2015-04-28 ENCOUNTER — Telehealth: Payer: Self-pay

## 2015-04-28 ENCOUNTER — Encounter: Payer: Self-pay | Admitting: Family

## 2015-04-28 DIAGNOSIS — I739 Peripheral vascular disease, unspecified: Secondary | ICD-10-CM

## 2015-04-28 DIAGNOSIS — L989 Disorder of the skin and subcutaneous tissue, unspecified: Secondary | ICD-10-CM

## 2015-04-28 NOTE — Telephone Encounter (Signed)
Spoke to pt to sch appt 4/25 lab at 2 and np at 3:15.

## 2015-04-28 NOTE — Telephone Encounter (Signed)
Phone call from pt.  Reported he saw Dr. [name] recently, and was advised to see the Vascular Surgeon due to a "bump" on inner left lower leg.  Reported the bump is approx. Quarter-sized, and hard.  Stated it has been present about 1-1.5 weeks.  Denied any break in skin.  Stated that the area is sore when he pushes on it.  Reported Dr. [name] has put him on an antibiotic.  Denied any pain, change in color, or change in temperature of left LE.  Questioned about fever/ chills.  Stated "sometimes I wake up at night in a sweat."  Advised will have scheduler call him with an appt.

## 2015-05-02 ENCOUNTER — Ambulatory Visit (HOSPITAL_COMMUNITY)
Admission: RE | Admit: 2015-05-02 | Discharge: 2015-05-02 | Disposition: A | Discharge status: Home / Self Care | Payer: Medicare Other | Source: Ambulatory Visit | Attending: Family | Admitting: Family

## 2015-05-02 ENCOUNTER — Encounter: Payer: Self-pay | Admitting: Family

## 2015-05-02 ENCOUNTER — Ambulatory Visit (INDEPENDENT_AMBULATORY_CARE_PROVIDER_SITE_OTHER): Payer: Medicare Other | Admitting: Family

## 2015-05-02 VITALS — BP 116/72 | HR 82 | Temp 98.1°F | Resp 18 | Ht 70.0 in | Wt 184.0 lb

## 2015-05-02 DIAGNOSIS — I779 Disorder of arteries and arterioles, unspecified: Secondary | ICD-10-CM | POA: Diagnosis not present

## 2015-05-02 DIAGNOSIS — Z95828 Presence of other vascular implants and grafts: Secondary | ICD-10-CM

## 2015-05-02 DIAGNOSIS — I2510 Atherosclerotic heart disease of native coronary artery without angina pectoris: Secondary | ICD-10-CM | POA: Diagnosis not present

## 2015-05-02 DIAGNOSIS — E119 Type 2 diabetes mellitus without complications: Secondary | ICD-10-CM | POA: Diagnosis not present

## 2015-05-02 DIAGNOSIS — I739 Peripheral vascular disease, unspecified: Secondary | ICD-10-CM | POA: Insufficient documentation

## 2015-05-02 DIAGNOSIS — Z87891 Personal history of nicotine dependence: Secondary | ICD-10-CM

## 2015-05-02 DIAGNOSIS — K219 Gastro-esophageal reflux disease without esophagitis: Secondary | ICD-10-CM | POA: Insufficient documentation

## 2015-05-02 DIAGNOSIS — E1151 Type 2 diabetes mellitus with diabetic peripheral angiopathy without gangrene: Secondary | ICD-10-CM | POA: Diagnosis not present

## 2015-05-02 DIAGNOSIS — I10 Essential (primary) hypertension: Secondary | ICD-10-CM | POA: Insufficient documentation

## 2015-05-02 DIAGNOSIS — I6523 Occlusion and stenosis of bilateral carotid arteries: Secondary | ICD-10-CM

## 2015-05-02 DIAGNOSIS — L989 Disorder of the skin and subcutaneous tissue, unspecified: Secondary | ICD-10-CM | POA: Diagnosis not present

## 2015-05-02 DIAGNOSIS — E785 Hyperlipidemia, unspecified: Secondary | ICD-10-CM | POA: Insufficient documentation

## 2015-05-02 NOTE — Progress Notes (Signed)
[facility]  HISTORY AND PHYSICAL -PAD  History of Present Illness [name] is a 65 y.o. male patient of Dr. Kellie Simmering and Dr. Bridgett Larsson who has a history of lower extremity occlusive disease. He previously had a left femoral-popliteal bypass graft by Dr. Kellie Simmering in 2007 and revision of the graft in June of 2012. He also had right common iliac PTA and stenting by Dr. Bridgett Larsson September 2013.  He returns today with c/o sore bump noted on left shin about 2 weeks ago; he denies any known injury but states he noticed it when he was sweeping and bumped the broom on his shin.  He saw his PCP about this 4 days ago, Bactrim DS was prescribed and pt is taking this.  He is still riding his stationary bike regularly.   He denies hx of MI, positive for atrial fib. in the past.  He states that he was told that he had a "light stroke" in the remote past, pointed to behind hid right ear; no carotid Duplex on file, pt states no procedure would have helped or could be done.  He was seen in [facility] ED recently and found to have C7 issues.  Pt Diabetic: Yes, 6.5 A1C in February 2017 per pt, improved, was uncontrolled  Pt smoker: quit October, 2013   Pt meds include:  Statin :Yes  ASA: No  Other anticoagulants/antiplatelets: Plavix    Past Medical History  Diagnosis Date  . Type 2 diabetes mellitus ([place])   . Coronary atherosclerosis of native coronary artery     Nonobstrtuctive by catheterization 2007 and 2013  . PAD (peripheral artery disease) ([place])     s/p left fem-pop bypass 2007; PTA and stenting of right iliac artery 09/2011  . Rheumatoid arthritis(714.0)   . GERD (gastroesophageal reflux disease)   . Essential hypertension, benign   . Hyperlipidemia   . COPD (chronic obstructive pulmonary disease) ([place])   . Bronchitis   . Carotid artery occlusion   . Cervical spine disease     Neck surgery in 2007 with Dr. Joya Salm  . DVT (deep venous thrombosis) ([place])      Social History Social History  Substance Use Topics  . Smoking status: Former Smoker -- 0.10 packs/day for 40 years    Types: Cigarettes, Cigars    Quit date: 09/01/2011  . Smokeless tobacco: Never Used  . Alcohol Use: 0.0 oz/week    0 Standard drinks or equivalent per week     Comment: occasional beer    Family History Family History  Problem Relation Age of Onset  . Heart attack Mother 50  . Deep vein thrombosis Mother   . Diabetes Mother   . Hyperlipidemia Mother   . Hypertension Mother   . Peripheral vascular disease Mother   . Stroke Father   . Diabetes Father   . Hyperlipidemia Father   . Hypertension Father   . Peripheral vascular disease Father   . Diabetes Sister   . Hyperlipidemia Sister   . Hypertension Sister   . Diabetes Brother   . Hyperlipidemia Brother   . Cancer Brother   . Deep vein thrombosis Brother   . Hypertension Brother   . Peripheral vascular disease Brother   . Hyperlipidemia Son   . Cancer Son   . Hypertension Son     Past Surgical History  Procedure Laterality Date  . Repair of left arm fracture    . 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 Surgery  scrotal / testicular    . Femoral-popliteal bypass graft  11/06/2005  . Pr vein bypass graft,aorto-fem-pop  06-22-2010    Redo Left Fem-Pop  . Iliac artery stent  10-03-11    Right CIA stenting  . Abdominal aortagram N/A 10/03/2011    Procedure: ABDOMINAL Maxcine Ham;  Surgeon: Conrad Walcott, MD;  [facility] LAB;  Service: Cardiovascular;  Laterality: N/A;    No Known Allergies  Current Outpatient Prescriptions  Medication Sig Dispense Refill  . atorvastatin (LIPITOR) 20 MG tablet Take 20 mg by mouth daily.    . celecoxib (CELEBREX) 200 MG capsule Take 1 capsule (200 mg total) by mouth daily. (HOLD X 5 DAYS WHILE TAKING THE IBUPROFEN)    . CIALIS 5 MG tablet   0  . clopidogrel (PLAVIX) 75 MG tablet Take 1 tablet (75 mg total) by mouth daily. 30 tablet 11  . DEXILANT 30 MG capsule  Take 1 capsule by mouth daily.    . diazepam (VALIUM) 5 MG tablet Take 5 mg by mouth every 12 (twelve) hours as needed for anxiety.    Marland Kitchen glipiZIDE (GLUCOTROL) 10 MG tablet Take 5 mg by mouth 2 (two) times daily before a meal.     . HYDROcodone-acetaminophen (NORCO) 7.5-325 MG per tablet Take 1 tablet by mouth 2 (two) times daily as needed.    Marland Kitchen JANUVIA 100 MG tablet Take 1 tablet by mouth daily.    Marland Kitchen lisinopril (PRINIVIL,ZESTRIL) 10 MG tablet Take 1 tablet (10 mg total) by mouth daily. 90 tablet 3  . metFORMIN (GLUCOPHAGE) 500 MG tablet Take 1,000 mg by mouth 2 (two) times daily with a meal.     . nitroGLYCERIN (NITROSTAT) 0.4 MG SL tablet Place 1 tablet (0.4 mg total) under the tongue every 5 (five) minutes as needed for chest pain. 90 tablet 3  . ONE TOUCH ULTRA TEST test strip     . PROAIR HFA 108 (90 BASE) MCG/ACT inhaler Inhale 2 puffs into the lungs every 4 (four) hours as needed.     . sulfamethoxazole-trimethoprim (BACTRIM DS,SEPTRA DS) 800-160 MG tablet Take 1 tablet by mouth 2 (two) times daily.  0  . VOLTAREN 1 % GEL Apply 1 application topically as needed.      No current facility-administered medications for this visit.    ROS: See HPI for pertinent positives and negatives.   Physical Examination  Filed Vitals:   05/02/15 1537  BP: 116/72  Pulse: 82  Temp: 98.1 F (36.7 C)  TempSrc: Oral  Resp: 18  Height: 5\' 10"  (1.778 m)  Weight: 184 lb (83.462 kg)  SpO2: 98%   Body mass index is 26.4 kg/(m^2).  General: A&O x 3, WDWN,  Gait: normal  Eyes: PERRLA,  Pulmonary: CTAB, without wheezes , rales or rhonchi  Cardiac: regular rythm, no detected murmur   Carotid Bruits  Left  Right    Negative  Negative    Aorta: not palpable  Radial pulses: 2+ palpable.   VASCULAR EXAM:  Extremities without ischemic changes  without Gangrene; without open wounds, no lesion at left shin, no raised area, no edema, no erythema nor streaking. Moderately  tender to touch at spot that pt points to on left shin.  LE Pulses  LEFT  RIGHT   FEMORAL  2+palpable  2+palpable   POPLITEAL  not palpable  not  palpable   POSTERIOR TIBIAL  1+ palpable  1+ palpable   DORSALIS PEDIS  ANTERIOR TIBIAL  Faintly palpable  2+palpable  Abdomen: soft, NT, no masses palpated.  Skin: no rashes, no ulcers.  Musculoskeletal: no muscle wasting or atrophy.  Neurologic: A&O X 3; Appropriate Affect, MOTOR FUNCTION: moving all extremities equally, motor strength 5/5 throughout. Speech is fluent/normal. CN 2-12 intact except hard of hearing.                     Non-Invasive Vascular Imaging: DATE: 05/02/2015 ABI: RIGHT: [place] ([place], 09/23/14), Waveforms: triphasic, TBI: 0.58, toe pressure 74;  LEFT: 1.15, Waveforms: triphasic, TBI: 0.41, toe pressure 52   ASSESSMENT: Damontre Hinerman is a 65 y.o. male who is s/p left femoral-popliteal bypass graft by Dr. Kellie Simmering in 2007 and revision of the graft in June of 2012. He also had right common iliac PTA and stenting by Dr. Bridgett Larsson September 2013. Pt reports remote history of TIA.  September 2016 carotid duplex suggests minimal bilateral ICA stenoses.  ABI's today indicate non compressible vessels in the right leg (tibial artery medial calcification), waveforms are triphasic. Left ABI is normal with triphasic waveforms.  Pt is concerned that the moderately tender spot on his left shin that he noticed 2 weeks ago may be related to lack of blood flow.  ABI's indicate normal arterial perfusion in his left leg. Pt has no observable lesion on his left shin where he points to a tender area. I advised pt to follow up with his PCP re any continuing left shin tenderness.    PLAN:  Based on the patient's vascular studies and examination, pt will return to clinic in September 2017 as already scheduled with carotid Duplex, ABI's, right iliac artery duplex, and left LE arterial  duplex.  Pt knows to notify us if he has concerns about the circulation in his legs.  I discussed in depth with the patient the nature of atherosclerosis, and emphasized the importance of maximal medical management including strict control of blood pressure, blood glucose, and lipid levels, obtaining regular exercise, and continued cessation of smoking.  The patient is aware that without maximal medical management the underlying atherosclerotic disease process will progress, limiting the benefit of any interventions.  The patient was given information about PAD including signs, symptoms, treatment, what symptoms should prompt the patient to seek immediate medical care, and risk reduction measures to take.  Clemon Chambers, RN, MSN, FNP-C [facility] [employer] Phone: [phone]  Clinic MD: Kellie Simmering  05/02/2015 3:47 PM

## 2015-05-02 NOTE — Patient Instructions (Signed)
Peripheral Vascular Disease Peripheral vascular disease (PVD) is a disease of the blood vessels that are not part of your heart and brain. A simple term for PVD is poor circulation. In most cases, PVD narrows the blood vessels that carry blood from your heart to the rest of your body. This can result in a decreased supply of blood to your arms, legs, and internal organs, like your stomach or kidneys. However, it most often affects a person's lower legs and feet. There are two types of PVD.  Organic PVD. This is the more common type. It is caused by damage to the structure of blood vessels.  Functional PVD. This is caused by conditions that make blood vessels contract and tighten (spasm). Without treatment, PVD tends to get worse over time. PVD can also lead to acute ischemic limb. This is when an arm or limb suddenly has trouble getting enough blood. This is a medical emergency. CAUSES Each type of PVD has many different causes. The most common cause of PVD is buildup of a fatty material (plaque) inside of your arteries (atherosclerosis). Small amounts of plaque can break off from the walls of the blood vessels and become lodged in a smaller artery. This blocks blood flow and can cause acute ischemic limb. Other common causes of PVD include:  Blood clots that form inside of blood vessels.  Injuries to blood vessels.  Diseases that cause inflammation of blood vessels or cause blood vessel spasms.  Health behaviors and health history that increase your risk of developing PVD. RISK FACTORS  You may have a greater risk of PVD if you:  Have a family history of PVD.  Have certain medical conditions, including:  High cholesterol.  Diabetes.  High blood pressure (hypertension).  Coronary heart disease.  Past problems with blood clots.  Past injury, such as burns or a broken bone. These may have damaged blood vessels in your limbs.  Buerger disease. This is caused by inflamed blood  vessels in your hands and feet.  Some forms of arthritis.  Rare birth defects that affect the arteries in your legs.  Use tobacco.  Do not get enough exercise.  Are obese.  Are age 50 or older. SIGNS AND SYMPTOMS  PVD may cause many different symptoms. Your symptoms depend on what part of your body is not getting enough blood. Some common signs and symptoms include:  Cramps in your lower legs. This may be a symptom of poor leg circulation (claudication).  Pain and weakness in your legs while you are physically active that goes away when you rest (intermittent claudication).  Leg pain when at rest.  Leg numbness, tingling, or weakness.  Coldness in a leg or foot, especially when compared with the other leg.  Skin or hair changes. These can include:  Hair loss.  Shiny skin.  Pale or bluish skin.  Thick toenails.  Inability to get or maintain an erection (erectile dysfunction). People with PVD are more prone to developing ulcers and sores on their toes, feet, or legs. These may take longer than normal to heal. DIAGNOSIS Your health care provider may diagnose PVD from your signs and symptoms. The health care provider will also do a physical exam. You may have tests to find out what is causing your PVD and determine its severity. Tests may include:  Blood pressure recordings from your arms and legs and measurements of the strength of your pulses (pulse volume recordings).  Imaging studies using sound waves to take pictures of   the blood flow through your blood vessels (Doppler ultrasound).  Injecting a dye into your blood vessels before having imaging studies using:  X-rays (angiogram or arteriogram).  Computer-generated X-rays (CT angiogram).  A powerful electromagnetic field and a computer (magnetic resonance angiogram or MRA). TREATMENT Treatment for PVD depends on the cause of your condition and the severity of your symptoms. It also depends on your age. Underlying  causes need to be treated and controlled. These include long-lasting (chronic) conditions, such as diabetes, high cholesterol, and high blood pressure. You may need to first try making lifestyle changes and taking medicines. Surgery may be needed if these do not work. Lifestyle changes may include:  Quitting smoking.  Exercising regularly.  Following a low-fat, low-cholesterol diet. Medicines may include:  Blood thinners to prevent blood clots.  Medicines to improve blood flow.  Medicines to improve your blood cholesterol levels. Surgical procedures may include:  A procedure that uses an inflated balloon to open a blocked artery and improve blood flow (angioplasty).  A procedure to put in a tube (stent) to keep a blocked artery open (stent implant).  Surgery to reroute blood flow around a blocked artery (peripheral bypass surgery).  Surgery to remove dead tissue from an infected wound on the affected limb.  Amputation. This is surgical removal of the affected limb. This may be necessary in cases of acute ischemic limb that are not improved through medical or surgical treatments. HOME CARE INSTRUCTIONS  Take medicines only as directed by your health care provider.  Do not use any tobacco products, including cigarettes, chewing tobacco, or electronic cigarettes. If you need help quitting, ask your health care provider.  Lose weight if you are overweight, and maintain a healthy weight as directed by your health care provider.  Eat a diet that is low in fat and cholesterol. If you need help, ask your health care provider.  Exercise regularly. Ask your health care provider to suggest some good activities for you.  Use compression stockings or other mechanical devices as directed by your health care provider.  Take good care of your feet.  Wear comfortable shoes that fit well.  Check your feet often for any cuts or sores. SEEK MEDICAL CARE IF:  You have cramps in your legs  while walking.  You have leg pain when you are at rest.  You have coldness in a leg or foot.  Your skin changes.  You have erectile dysfunction.  You have cuts or sores on your feet that are not healing. SEEK IMMEDIATE MEDICAL CARE IF:  Your arm or leg turns cold and blue.  Your arms or legs become red, warm, swollen, painful, or numb.  You have chest pain or trouble breathing.  You suddenly have weakness in your face, arm, or leg.  You become very confused or lose the ability to speak.  You suddenly have a very bad headache or lose your vision.   This information is not intended to replace advice given to you by your health care provider. Make sure you discuss any questions you have with your health care provider.   Document Released: 02/01/2004 Document Revised: 01/14/2014 Document Reviewed: 06/03/2013 Elsevier Interactive Patient Education 2016 Elsevier Inc[employer]    Stroke Prevention Some medical conditions and behaviors are associated with an increased chance of having a stroke. You may prevent a stroke by making healthy choices and managing medical conditions. HOW CAN I REDUCE MY RISK OF HAVING A STROKE?   Stay physically active. Get at   least 30 minutes of activity on most or all days.  Do not smoke. It may also be helpful to avoid exposure to secondhand smoke.  Limit alcohol use. Moderate alcohol use is considered to be:  No more than 2 drinks per day for men.  No more than 1 drink per day for nonpregnant women.  Eat healthy foods. This involves:  Eating 5 or more servings of fruits and vegetables a day.  Making dietary changes that address high blood pressure (hypertension), high cholesterol, diabetes, or obesity.  Manage your cholesterol levels.  Making food choices that are high in fiber and low in saturated fat, trans fat, and cholesterol may control cholesterol levels.  Take any prescribed medicines to control cholesterol as directed by your health care  provider.  Manage your diabetes.  Controlling your carbohydrate and sugar intake is recommended to manage diabetes.  Take any prescribed medicines to control diabetes as directed by your health care provider.  Control your hypertension.  Making food choices that are low in salt (sodium), saturated fat, trans fat, and cholesterol is recommended to manage hypertension.  Ask your health care provider if you need treatment to lower your blood pressure. Take any prescribed medicines to control hypertension as directed by your health care provider.  If you are 18-39 years of age, have your blood pressure checked every 3-5 years. If you are 40 years of age or older, have your blood pressure checked every year.  Maintain a healthy weight.  Reducing calorie intake and making food choices that are low in sodium, saturated fat, trans fat, and cholesterol are recommended to manage weight.  Stop drug abuse.  Avoid taking birth control pills.  Talk to your health care provider about the risks of taking birth control pills if you are over 35 years old, smoke, get migraines, or have ever had a blood clot.  Get evaluated for sleep disorders (sleep apnea).  Talk to your health care provider about getting a sleep evaluation if you snore a lot or have excessive sleepiness.  Take medicines only as directed by your health care provider.  For some people, aspirin or blood thinners (anticoagulants) are helpful in reducing the risk of forming abnormal blood clots that can lead to stroke. If you have the irregular heart rhythm of atrial fibrillation, you should be on a blood thinner unless there is a good reason you cannot take them.  Understand all your medicine instructions.  Make sure that other conditions (such as anemia or atherosclerosis) are addressed. SEEK IMMEDIATE MEDICAL CARE IF:   You have sudden weakness or numbness of the face, arm, or leg, especially on one side of the body.  Your face  or eyelid droops to one side.  You have sudden confusion.  You have trouble speaking (aphasia) or understanding.  You have sudden trouble seeing in one or both eyes.  You have sudden trouble walking.  You have dizziness.  You have a loss of balance or coordination.  You have a sudden, severe headache with no known cause.  You have new chest pain or an irregular heartbeat. Any of these symptoms may represent a serious problem that is an emergency. Do not wait to see if the symptoms will go away. Get medical help at once. Call your local emergency services (911 in [place] Do not drive yourself to the hospital.   This information is not intended to replace advice given to you by your health care provider. Make sure you discuss any questions   you have with your health care provider.   Document Released: 02/01/2004 Document Revised: 01/14/2014 Document Reviewed: 06/26/2012 Elsevier Interactive Patient Education 2016 Elsevier Inc.  

## 2015-06-16 ENCOUNTER — Encounter: Payer: Self-pay | Admitting: Cardiology

## 2015-06-16 ENCOUNTER — Ambulatory Visit (INDEPENDENT_AMBULATORY_CARE_PROVIDER_SITE_OTHER): Payer: Medicare Other | Admitting: Cardiology

## 2015-06-16 VITALS — BP 102/78 | HR 89 | Ht 70.0 in | Wt 181.0 lb

## 2015-06-16 DIAGNOSIS — I2510 Atherosclerotic heart disease of native coronary artery without angina pectoris: Secondary | ICD-10-CM

## 2015-06-16 DIAGNOSIS — E785 Hyperlipidemia, unspecified: Secondary | ICD-10-CM | POA: Diagnosis not present

## 2015-06-16 DIAGNOSIS — I10 Essential (primary) hypertension: Secondary | ICD-10-CM | POA: Diagnosis not present

## 2015-06-16 DIAGNOSIS — I739 Peripheral vascular disease, unspecified: Secondary | ICD-10-CM

## 2015-06-16 NOTE — Patient Instructions (Signed)
Continue all current medications. Your physician wants you to follow up in: 6 months.  You will receive a reminder letter in the mail one-two months in advance.  If you don't receive a letter, please call our office to schedule the follow up appointment   

## 2015-06-16 NOTE — Progress Notes (Signed)
Cardiology Office Note  Date: 06/16/2015   ID: [name], DOB Aug 11, 1950, MRN [id number]  PCP: [name], MD  Primary Cardiologist: Rozann Lesches, MD   Chief Complaint  Patient presents with  . Coronary Artery Disease    History of Present Illness: [name] is a 65 y.o. male last seen in December 2016. He presents for a routine follow-up visit. He does not report any angina symptoms on medical therapy. States that he has had some trouble with fluctuating blood pressure and a recent nosebleed, but this has not been recurrent.  He has had interval follow-up with the [facility]. He has a history of left femoral-popliteal bypass graft by Dr. Kellie Simmering in 2007 and revision of the graft in June of 2012. He also had right common iliac PTA and stenting by Dr. Bridgett Larsson September 2013.  We have continued medical therapy and observation with history of nonobstructive CAD based on previous evaluation, most recently 2013. Current cardiac regimen includes Plavix, Lipitor, lisinopril, and as needed nitroglycerin. I reviewed his ECG today which shows normal sinus rhythm.  He continues to follow with Dr. [name] for primary care.  Past Medical History  Diagnosis Date  . Type 2 diabetes mellitus ([place])   . Coronary atherosclerosis of native coronary artery     Nonobstrtuctive by catheterization 2007 and 2013  . PAD (peripheral artery disease) ([place])     s/p left fem-pop bypass 2007; PTA and stenting of right iliac artery 09/2011  . Rheumatoid arthritis(714.0)   . GERD (gastroesophageal reflux disease)   . Essential hypertension, benign   . Hyperlipidemia   . COPD (chronic obstructive pulmonary disease) ([place])   . Bronchitis   . Carotid artery occlusion   . Cervical spine disease     Neck surgery in 2007 with Dr. Joya Salm  . DVT (deep venous thrombosis) [facility])     Past Surgical History  Procedure Laterality Date  . Repair of left arm fracture    . 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 Surgery scrotal  / testicular    . Femoral-popliteal bypass graft  11/06/2005  . Pr vein bypass graft,aorto-fem-pop  06-22-2010    Redo Left Fem-Pop  . Iliac artery stent  10-03-11    Right CIA stenting  . Abdominal aortagram N/A 10/03/2011    Procedure: ABDOMINAL Maxcine Ham;  Surgeon: Conrad Danbury, MD;  Location: [facility] CATH LAB;  Service: Cardiovascular;  Laterality: N/A;    Current Outpatient Prescriptions  Medication Sig Dispense Refill  . atorvastatin (LIPITOR) 20 MG tablet Take 20 mg by mouth daily.    . celecoxib (CELEBREX) 200 MG capsule Take 1 capsule (200 mg total) by mouth daily. (HOLD X 5 DAYS WHILE TAKING THE IBUPROFEN)    . CIALIS 5 MG tablet   0  . clopidogrel (PLAVIX) 75 MG tablet Take 1 tablet (75 mg total) by mouth daily. 30 tablet 11  . DEXILANT 30 MG capsule Take 30 mg by mouth. Take prn if BP goes up    . diazepam (VALIUM) 5 MG tablet Take 5 mg by mouth every 12 (twelve) hours as needed for anxiety.    Marland Kitchen glipiZIDE (GLUCOTROL) 10 MG tablet Take 5 mg by mouth 2 (two) times daily before a meal.     . HYDROcodone-acetaminophen (NORCO) 7.5-325 MG per tablet Take 1 tablet by mouth 2 (two) times daily as needed.    Marland Kitchen JANUVIA 100 MG tablet Take 1 tablet by mouth daily.    Marland Kitchen lisinopril (PRINIVIL,ZESTRIL)  10 MG tablet Take 1 tablet (10 mg total) by mouth daily. 90 tablet 3  . metFORMIN (GLUCOPHAGE) 500 MG tablet Take 1,000 mg by mouth 2 (two) times daily with a meal.     . nitroGLYCERIN (NITROSTAT) 0.4 MG SL tablet Place 1 tablet (0.4 mg total) under the tongue every 5 (five) minutes as needed for chest pain. 90 tablet 3  . ONE TOUCH ULTRA TEST test strip     . PROAIR HFA 108 (90 BASE) MCG/ACT inhaler Inhale 2 puffs into the lungs every 4 (four) hours as needed.     . sulfamethoxazole-trimethoprim (BACTRIM DS,SEPTRA DS) 800-160 MG tablet Take 1 tablet by mouth 2 (two) times daily.  0  . VOLTAREN 1 % GEL Apply 1 application topically as needed.      No current facility-administered medications for  this visit.   Allergies:  Review of patient's allergies indicates no known allergies.   Social History: The patient  reports that he quit smoking about 3 years ago. His smoking use included Cigarettes and Cigars. He has a 4 pack-year smoking history. He has never used smokeless tobacco. He reports that he drinks alcohol. He reports that he does not use illicit drugs.   ROS:  Please see the history of present illness. Otherwise, complete review of systems is positive for arthritic pains and neck pain.  All other systems are reviewed and negative.   Physical Exam: VS:  BP 102/78 mmHg  Pulse 89  Ht 5\' 10"  (1.778 m)  Wt 181 lb (82.101 kg)  BMI 25.97 kg/m2  SpO2 98%, BMI Body mass index is 25.97 kg/(m^2).  Wt Readings from Last 3 Encounters:  06/16/15 181 lb (82.101 kg)  05/02/15 184 lb (83.462 kg)  12/23/14 189 lb (85.73 kg)    General: Patient appears comfortable at rest. HEENT: Conjunctiva and lids normal, oropharynx clear. Neck: No elevated JVP or carotid bruits, no thyromegaly. Lungs: Clear to auscultation, nonlabored breathing at rest. Cardiac: Regular rate and rhythm, S4, no significant systolic murmur, no pericardial rub. Abdomen: Soft, nontender, bowel sounds present, no guarding or rebound. Extremities: No pitting edema, DPs diminished bilaterally. Skin: Warm and dry. Musculoskeletal: No kyphosis. Neuropsychiatric: Alert and oriented 3, affect appropriate.  ECG: I personally reviewed the tracing from 05/16/2014 which showed sinus arrhythmia with nonspecific T-wave changes.  Recent Labwork:  April 2016: Hemoglobin A1c 7.1, cholesterol 147, triglycerides 73, HDL 46, LDL 86  Other Studies Reviewed Today:  Echocardiogram 11/24/2014: Study Conclusions  - Left ventricle: The cavity size was normal. Wall thickness was  normal. Systolic function was normal. The estimated ejection  fraction was in the range of 60% to 65%. Doppler parameters are  consistent with abnormal  left ventricular relaxation (grade 1  diastolic dysfunction). - Aortic valve: Mildly calcified annulus. Trileaflet; mildly  thickened leaflets. There was mild regurgitation. The AI VC is  0.3 cm. Valve area (VTI): 2.18 cm^2. Valve area (Vmax): 1.73  cm^2. Valve area (Vmean): 2.35 cm^2. Regurgitation pressure  half-time: 546 ms. - Mitral valve: Mildly calcified annulus. Mildly thickened leaflets. - Atrial septum: No defect or patent foramen ovale was identified. - Technically difficult study.  Assessment and Plan:  1. History of nonobstructive CAD, no active angina symptoms on current medical regimen. Continue observation.  2. Essential hypertension, blood pressure is well controlled today. Continue lisinopril.  3. Peripheral arterial disease as outlined above with recent interval follow-up visit noted with vascular surgery. No progressive claudication symptoms.  4. Hyperlipidemia, on Lipitor with last  LDL 86.  Current medicines were reviewed with the patient today.   Orders Placed This Encounter  Procedures  . EKG 12-Lead    Disposition: FU with me in 6 months.   Signed, Satira Sark, MD, [facility] 06/16/2015 9:04 AM    [facility] [place], [place], [place] [address] Phone: [phone]; Fax: [phone]

## 2015-09-25 ENCOUNTER — Encounter: Payer: Self-pay | Admitting: Family

## 2015-09-29 ENCOUNTER — Ambulatory Visit (INDEPENDENT_AMBULATORY_CARE_PROVIDER_SITE_OTHER)
Admission: RE | Admit: 2015-09-29 | Discharge: 2015-09-29 | Disposition: A | Discharge status: Home / Self Care | Payer: Medicare Other | Source: Ambulatory Visit | Attending: Vascular Surgery | Admitting: Vascular Surgery

## 2015-09-29 ENCOUNTER — Encounter: Payer: Self-pay | Admitting: Family

## 2015-09-29 ENCOUNTER — Ambulatory Visit (INDEPENDENT_AMBULATORY_CARE_PROVIDER_SITE_OTHER): Payer: Medicare Other | Admitting: Family

## 2015-09-29 ENCOUNTER — Ambulatory Visit (HOSPITAL_COMMUNITY)
Admission: RE | Admit: 2015-09-29 | Discharge: 2015-09-29 | Disposition: A | Discharge status: Home / Self Care | Payer: Medicare Other | Source: Ambulatory Visit | Attending: Vascular Surgery | Admitting: Vascular Surgery

## 2015-09-29 ENCOUNTER — Ambulatory Visit: Payer: Medicare Other | Admitting: Family

## 2015-09-29 VITALS — BP 125/79 | HR 91 | Temp 98.1°F | Resp 16 | Ht 70.0 in | Wt 174.0 lb

## 2015-09-29 DIAGNOSIS — I6523 Occlusion and stenosis of bilateral carotid arteries: Secondary | ICD-10-CM

## 2015-09-29 DIAGNOSIS — Z87891 Personal history of nicotine dependence: Secondary | ICD-10-CM

## 2015-09-29 DIAGNOSIS — E1151 Type 2 diabetes mellitus with diabetic peripheral angiopathy without gangrene: Secondary | ICD-10-CM

## 2015-09-29 DIAGNOSIS — I879 Disorder of vein, unspecified: Secondary | ICD-10-CM | POA: Insufficient documentation

## 2015-09-29 DIAGNOSIS — Z95828 Presence of other vascular implants and grafts: Secondary | ICD-10-CM

## 2015-09-29 DIAGNOSIS — I779 Disorder of arteries and arterioles, unspecified: Secondary | ICD-10-CM | POA: Diagnosis not present

## 2015-09-29 DIAGNOSIS — Z4889 Encounter for other specified surgical aftercare: Secondary | ICD-10-CM

## 2015-09-29 DIAGNOSIS — Z48812 Encounter for surgical aftercare following surgery on the circulatory system: Secondary | ICD-10-CM

## 2015-09-29 NOTE — Patient Instructions (Addendum)
Peripheral Vascular Disease Peripheral vascular disease (PVD) is a disease of the blood vessels that are not part of your heart and brain. A simple term for PVD is poor circulation. In most cases, PVD narrows the blood vessels that carry blood from your heart to the rest of your body. This can result in a decreased supply of blood to your arms, legs, and internal organs, like your stomach or kidneys. However, it most often affects a person's lower legs and feet. There are two types of PVD.  Organic PVD. This is the more common type. It is caused by damage to the structure of blood vessels.  Functional PVD. This is caused by conditions that make blood vessels contract and tighten (spasm). Without treatment, PVD tends to get worse over time. PVD can also lead to acute ischemic limb. This is when an arm or limb suddenly has trouble getting enough blood. This is a medical emergency. CAUSES Each type of PVD has many different causes. The most common cause of PVD is buildup of a fatty material (plaque) inside of your arteries (atherosclerosis). Small amounts of plaque can break off from the walls of the blood vessels and become lodged in a smaller artery. This blocks blood flow and can cause acute ischemic limb. Other common causes of PVD include:  Blood clots that form inside of blood vessels.  Injuries to blood vessels.  Diseases that cause inflammation of blood vessels or cause blood vessel spasms.  Health behaviors and health history that increase your risk of developing PVD. RISK FACTORS  You may have a greater risk of PVD if you:  Have a family history of PVD.  Have certain medical conditions, including:  High cholesterol.  Diabetes.  High blood pressure (hypertension).  Coronary heart disease.  Past problems with blood clots.  Past injury, such as burns or a broken bone. These may have damaged blood vessels in your limbs.  Buerger disease. This is caused by inflamed blood  vessels in your hands and feet.  Some forms of arthritis.  Rare birth defects that affect the arteries in your legs.  Use tobacco.  Do not get enough exercise.  Are obese.  Are age 50 or older. SIGNS AND SYMPTOMS  PVD may cause many different symptoms. Your symptoms depend on what part of your body is not getting enough blood. Some common signs and symptoms include:  Cramps in your lower legs. This may be a symptom of poor leg circulation (claudication).  Pain and weakness in your legs while you are physically active that goes away when you rest (intermittent claudication).  Leg pain when at rest.  Leg numbness, tingling, or weakness.  Coldness in a leg or foot, especially when compared with the other leg.  Skin or hair changes. These can include:  Hair loss.  Shiny skin.  Pale or bluish skin.  Thick toenails.  Inability to get or maintain an erection (erectile dysfunction). People with PVD are more prone to developing ulcers and sores on their toes, feet, or legs. These may take longer than normal to heal. DIAGNOSIS Your health care provider may diagnose PVD from your signs and symptoms. The health care provider will also do a physical exam. You may have tests to find out what is causing your PVD and determine its severity. Tests may include:  Blood pressure recordings from your arms and legs and measurements of the strength of your pulses (pulse volume recordings).  Imaging studies using sound waves to take pictures of   the blood flow through your blood vessels (Doppler ultrasound).  Injecting a dye into your blood vessels before having imaging studies using:  X-rays (angiogram or arteriogram).  Computer-generated X-rays (CT angiogram).  A powerful electromagnetic field and a computer (magnetic resonance angiogram or MRA). TREATMENT Treatment for PVD depends on the cause of your condition and the severity of your symptoms. It also depends on your age. Underlying  causes need to be treated and controlled. These include long-lasting (chronic) conditions, such as diabetes, high cholesterol, and high blood pressure. You may need to first try making lifestyle changes and taking medicines. Surgery may be needed if these do not work. Lifestyle changes may include:  Quitting smoking.  Exercising regularly.  Following a low-fat, low-cholesterol diet. Medicines may include:  Blood thinners to prevent blood clots.  Medicines to improve blood flow.  Medicines to improve your blood cholesterol levels. Surgical procedures may include:  A procedure that uses an inflated balloon to open a blocked artery and improve blood flow (angioplasty).  A procedure to put in a tube (stent) to keep a blocked artery open (stent implant).  Surgery to reroute blood flow around a blocked artery (peripheral bypass surgery).  Surgery to remove dead tissue from an infected wound on the affected limb.  Amputation. This is surgical removal of the affected limb. This may be necessary in cases of acute ischemic limb that are not improved through medical or surgical treatments. HOME CARE INSTRUCTIONS  Take medicines only as directed by your health care provider.  Do not use any tobacco products, including cigarettes, chewing tobacco, or electronic cigarettes. If you need help quitting, ask your health care provider.  Lose weight if you are overweight, and maintain a healthy weight as directed by your health care provider.  Eat a diet that is low in fat and cholesterol. If you need help, ask your health care provider.  Exercise regularly. Ask your health care provider to suggest some good activities for you.  Use compression stockings or other mechanical devices as directed by your health care provider.  Take good care of your feet.  Wear comfortable shoes that fit well.  Check your feet often for any cuts or sores. SEEK MEDICAL CARE IF:  You have cramps in your legs  while walking.  You have leg pain when you are at rest.  You have coldness in a leg or foot.  Your skin changes.  You have erectile dysfunction.  You have cuts or sores on your feet that are not healing. SEEK IMMEDIATE MEDICAL CARE IF:  Your arm or leg turns cold and blue.  Your arms or legs become red, warm, swollen, painful, or numb.  You have chest pain or trouble breathing.  You suddenly have weakness in your face, arm, or leg.  You become very confused or lose the ability to speak.  You suddenly have a very bad headache or lose your vision.   This information is not intended to replace advice given to you by your health care provider. Make sure you discuss any questions you have with your health care provider.   Document Released: 02/01/2004 Document Revised: 01/14/2014 Document Reviewed: 06/03/2013 Elsevier Interactive Patient Education 2016 [employer]    Before your next abdominal ultrasound:  Take two Extra-Strength Gas-X capsules at bedtime the night before the test. Take another two Extra-Strength Gas-X capsules 3 hours before the test.   

## 2015-09-29 NOTE — Progress Notes (Signed)
[facility] [place]   CC: Follow up peripheral artery occlusive disease  History of Present Illness [name] is a 65 y.o. male patient of Dr. Kellie Simmering and Dr. Bridgett Larsson who has a history of lower extremity arterial occlusive disease. He previously had a left femoral-popliteal bypass graft by Dr. Kellie Simmering in 2007 and revision of the graft in June of 2012. He also had right common iliac PTA and stenting by Dr. Bridgett Larsson September 2013.  He returns today for follow up.  He walks 20-30 minutes, 3 days/week; this is more walking than he had been doing.  His right hip and thigh have been hurting more int he morning, but as he walks more this improves.   He denies hx of MI, positive for atrial fib. in the past.  He states that he was told that he had a "light stroke" in the remote past, pointed to behind hid right ear; no carotid Duplex on file, pt states no procedure would have helped or could be done.  He was evaluated at [facility] ED and found to have C7 issues.  Pt Diabetic: Yes, 6.5 A1C in February 2017 per pt, improved, was previously uncontrolled  Pt smoker: quit October, 2013   Pt meds include:  Statin :Yes  ASA: No  Other anticoagulants/antiplatelets: Plavix     Past Medical History:  Diagnosis Date  . Bronchitis   . Carotid artery occlusion   . Cervical spine disease    Neck surgery in 2007 with Dr. Joya Salm  . COPD (chronic obstructive pulmonary disease) ([place])   . Coronary atherosclerosis of native coronary artery    Nonobstrtuctive by catheterization 2007 and 2013  . DVT (deep venous thrombosis) ([place])   . Essential hypertension, benign   . GERD (gastroesophageal reflux disease)   . Hyperlipidemia   . PAD (peripheral artery disease) ([place])    s/p left fem-pop bypass 2007; PTA and stenting of right iliac artery 09/2011  . Rheumatoid arthritis(714.0)   . Type 2 diabetes mellitus ([place])     Social History Social History  Substance Use  Topics  . Smoking status: Former Smoker    Packs/day: 0.10    Years: 40.00    Types: Cigarettes, Cigars    Quit date: 09/01/2011  . Smokeless tobacco: Never Used  . Alcohol use 0.0 oz/week     Comment: occasional beer    Family History Family History  Problem Relation Age of Onset  . Heart attack Mother 51  . Deep vein thrombosis Mother   . Diabetes Mother   . Hyperlipidemia Mother   . Hypertension Mother   . Peripheral vascular disease Mother   . Stroke Father   . Diabetes Father   . Hyperlipidemia Father   . Hypertension Father   . Peripheral vascular disease Father   . Diabetes Sister   . Hyperlipidemia Sister   . Hypertension Sister   . Diabetes Brother   . Hyperlipidemia Brother   . Cancer Brother   . Deep vein thrombosis Brother   . Hypertension Brother   . Peripheral vascular disease Brother   . Hyperlipidemia Son   . Cancer Son   . Hypertension Son     Past Surgical History:  Procedure Laterality Date  . ABDOMINAL AORTAGRAM N/A 10/03/2011   Procedure: ABDOMINAL Maxcine Ham;  Surgeon: Conrad Rio, MD;  Location: [facility] CATH LAB;  Service: Cardiovascular;  Laterality: N/A;  . FEMORAL-POPLITEAL BYPASS GRAFT  11/06/2005  . ILIAC ARTERY STENT  10-03-11  Right CIA stenting  . PR VEIN BYPASS GRAFT,AORTO-FEM-POP  06-22-2010   Redo Left Fem-Pop  . Repair of left arm fracture    . 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 SURGERY SCROTAL / TESTICULAR      No Known Allergies  Current Outpatient Prescriptions  Medication Sig Dispense Refill  . atorvastatin (LIPITOR) 20 MG tablet Take 20 mg by mouth daily.    . celecoxib (CELEBREX) 200 MG capsule Take 1 capsule (200 mg total) by mouth daily. (HOLD X 5 DAYS WHILE TAKING THE IBUPROFEN)    . CIALIS 5 MG tablet   0  . clopidogrel (PLAVIX) 75 MG tablet Take 1 tablet (75 mg total) by mouth daily. 30 tablet 11  . DEXILANT 30 MG capsule Take 30 mg by mouth. Take prn if BP goes up    . diazepam (VALIUM) 5 MG tablet Take 5 mg by mouth  every 12 (twelve) hours as needed for anxiety.    Marland Kitchen glipiZIDE (GLUCOTROL) 10 MG tablet Take 5 mg by mouth 2 (two) times daily before a meal.     . HYDROcodone-acetaminophen (NORCO) 7.5-325 MG per tablet Take 1 tablet by mouth 2 (two) times daily as needed.    Marland Kitchen JANUVIA 100 MG tablet Take 1 tablet by mouth daily.    Marland Kitchen lisinopril (PRINIVIL,ZESTRIL) 10 MG tablet Take 1 tablet (10 mg total) by mouth daily. 90 tablet 3  . metFORMIN (GLUCOPHAGE) 500 MG tablet Take 1,000 mg by mouth 2 (two) times daily with a meal.     . nitroGLYCERIN (NITROSTAT) 0.4 MG SL tablet Place 1 tablet (0.4 mg total) under the tongue every 5 (five) minutes as needed for chest pain. 90 tablet 3  . ONE TOUCH ULTRA TEST test strip     . PROAIR HFA 108 (90 BASE) MCG/ACT inhaler Inhale 2 puffs into the lungs every 4 (four) hours as needed.     . sulfamethoxazole-trimethoprim (BACTRIM DS,SEPTRA DS) 800-160 MG tablet Take 1 tablet by mouth 2 (two) times daily.  0  . VOLTAREN 1 % GEL Apply 1 application topically as needed.      No current facility-administered medications for this visit.     ROS: See HPI for pertinent positives and negatives.   Physical Examination  Vitals:   09/29/15 1310 09/29/15 1314  BP: 131/66 125/79  Pulse: 91   Resp: 16   Temp: 98.1 F (36.7 C)   TempSrc: Oral   SpO2: 94%   Weight: 174 lb (78.9 kg)   Height: 5\' 10"  (1.778 m)    Body mass index is 24.97 kg/m.  General: A&O x 3, WDWN,  Gait: normal  Eyes: PERRLA,  Pulmonary: Respirations are non labored, CTAB, without wheezes , rales or rhonchi  Cardiac: regular rhythm and rate, no detected murmur   Carotid Bruits  Left  Right    Negative  Negative    Aorta: not palpable  Radial pulses: 2+ palpable.   VASCULAR EXAM:  Extremities without ischemic changes  without Gangrene; without open wounds, no lesion at left shin, no raised area, no edema, no erythema nor streaking. Moderately tender to touch at spot  that pt points to on left shin.  LE Pulses  LEFT  RIGHT   FEMORAL  3+palpable  2+palpable   POPLITEAL  not palpable  not  palpable   POSTERIOR TIBIAL  not palpable  not palpable   DORSALIS PEDIS  ANTERIOR TIBIAL  Faintly palpable  3+palpable    Abdomen: soft, NT,  no masses palpated.  Skin: no rashes, no ulcers.  Musculoskeletal: no muscle wasting or atrophy.  Neurologic: A&O X 3; Appropriate Affect, MOTOR FUNCTION: moving all extremities equally, motor strength 5/5 throughout. Speech is fluent/normal. CN 2-12 intact except hard of hearing.     ASSESSMENT: Tustin Board is a 65 y.o. male  who is s/p left femoral-popliteal bypass graft by Dr. Kellie Simmering in 2007 and revision of the graft in June of 2012. He also had right common iliac PTA and stenting by Dr. Bridgett Larsson September 2013. Pt reports remote history of TIA. He reports pain in the right hip on awakening which improves with walking; this is c/w pain secondary to OA, not claudication.  He does not seem to have claudication in his left lower extremity.  There are no signs of ischemia in his feet/legs.  He is walking more.  His right DP pulse is 3+ palpable even though vessels are non compressible in the right LE.  The left LE bypass graft remains without restenosis.  Bilateral femoral pulses are palpable.   DATA Carotid duplex suggests minimal bilateral ICA stenoses, antegrade vertebral arteries, and multiphasic subclavian arteries (normal). No significant change compared to the exam on 09/23/14.  Right iliac artery stent duplex demonstrates limited visualization due to overlying bowel gas. Patent right iliac common iliac artery stent with no evidence of restenosis. No significant change compared to exam of 09/23/14.   Left LE arterial duplex demonstrates no evidence of restenosis in the bypass graft; no significant change from 09/23/14 exam.   Right ABI with suprasytolic pressures  due to non compressible vessels, likely secondary to medial calcification. Right DP is triphasic, PT is dampened and biphasic.  Left ABI is 0.95 with biphasic waveforms   PLAN:  Graduated walking program reinforced, and how to achieve.  Based on the patient's vascular studies and examination, pt will return to clinic in 6 months with ABI's. Will recheck right iliac artery stent duplex, left LE arterial duplex, and carotid duplex in a year.   I discussed in depth with the patient the nature of atherosclerosis, and emphasized the importance of maximal medical management including strict control of blood pressure, blood glucose, and lipid levels, obtaining regular exercise, and continued cessation of smoking.  The patient is aware that without maximal medical management the underlying atherosclerotic disease process will progress, limiting the benefit of any interventions.  The patient was given information about PAD including signs, symptoms, treatment, what symptoms should prompt the patient to seek immediate medical care, and risk reduction measures to take.  Clemon Chambers, RN, MSN, FNP-C [facility] Phone: [phone]  Clinic MD: Bridgett Larsson  09/29/15 1:31 PM

## 2015-10-05 ENCOUNTER — Encounter: Payer: Self-pay | Admitting: Adult Health

## 2015-10-05 ENCOUNTER — Ambulatory Visit (INDEPENDENT_AMBULATORY_CARE_PROVIDER_SITE_OTHER): Payer: Medicare Other | Admitting: Adult Health

## 2015-10-05 VITALS — BP 134/78 | HR 105 | Ht 70.0 in | Wt 176.0 lb

## 2015-10-05 DIAGNOSIS — I2510 Atherosclerotic heart disease of native coronary artery without angina pectoris: Secondary | ICD-10-CM

## 2015-10-05 DIAGNOSIS — E7800 Pure hypercholesterolemia, unspecified: Secondary | ICD-10-CM | POA: Diagnosis not present

## 2015-10-05 DIAGNOSIS — I739 Peripheral vascular disease, unspecified: Secondary | ICD-10-CM | POA: Diagnosis not present

## 2015-10-05 NOTE — Patient Instructions (Signed)
Your physician wants you to follow-up in: 6 Months with Dr. McDowell.  You will receive a reminder letter in the mail two months in advance. If you don't receive a letter, please call our office to schedule the follow-up appointment.  Your physician recommends that you continue on your current medications as directed. Please refer to the Current Medication list given to you today.  If you need a refill on your cardiac medications before your next appointment, please call your pharmacy.  Thank you for choosing  [facility]! '  

## 2015-10-05 NOTE — Progress Notes (Signed)
Name: [name]    DOB: 07/08/1950  Age: 65 y.o.  MR#: [id number]       PCP:  [name], MD      Insurance: Payor: [employer] MEDICARE / Plan: [facility] MEDICARE / Product Type: *No Product type* /   CC:   No chief complaint on file.   VS Vitals:   10/05/15 1403  BP: 134/78  Pulse: (!) 105  SpO2: 95%  Weight: 176 lb (79.8 kg)  Height: 5\' 10"  (1.778 m)    Weights Current Weight  10/05/15 176 lb (79.8 kg)  09/29/15 174 lb (78.9 kg)  06/16/15 181 lb (82.1 kg)    Blood Pressure  BP Readings from Last 3 Encounters:  10/05/15 134/78  09/29/15 125/79  06/16/15 102/78     Admit date:  (Not on file) Last encounter with RMR:  Visit date not found   Allergy Review of patient's allergies indicates no known allergies.  Current Outpatient Prescriptions  Medication Sig Dispense Refill  . atorvastatin (LIPITOR) 20 MG tablet Take 20 mg by mouth daily.    . celecoxib (CELEBREX) 200 MG capsule Take 1 capsule (200 mg total) by mouth daily. (HOLD X 5 DAYS WHILE TAKING THE IBUPROFEN)    . CIALIS 5 MG tablet   0  . clopidogrel (PLAVIX) 75 MG tablet Take 1 tablet (75 mg total) by mouth daily. 30 tablet 11  . glipiZIDE (GLUCOTROL) 10 MG tablet Take 5 mg by mouth 2 (two) times daily before a meal.     . HYDROcodone-acetaminophen (NORCO) 7.5-325 MG per tablet Take 1 tablet by mouth 2 (two) times daily as needed.    Marland Kitchen JANUVIA 100 MG tablet Take 1 tablet by mouth daily.    Marland Kitchen lisinopril (PRINIVIL,ZESTRIL) 2.5 MG tablet Take 2.5 mg by mouth daily.     . metFORMIN (GLUCOPHAGE) 500 MG tablet Take 1,000 mg by mouth 2 (two) times daily with a meal.     . nitroGLYCERIN (NITROSTAT) 0.4 MG SL tablet Place 1 tablet (0.4 mg total) under the tongue every 5 (five) minutes as needed for chest pain. 90 tablet 3  . ONE TOUCH ULTRA TEST test strip     . PROAIR HFA 108 (90 BASE) MCG/ACT inhaler Inhale 2 puffs into the lungs every 4 (four) hours as needed.     . VOLTAREN 1 % GEL Apply 1 application topically  as needed.      No current facility-administered medications for this visit.     Discontinued Meds:    Medications Discontinued During This Encounter  Medication Reason  . DEXILANT 30 MG capsule Error  . diazepam (VALIUM) 5 MG tablet Error  . lisinopril (PRINIVIL,ZESTRIL) 10 MG tablet Dose change  . sulfamethoxazole-trimethoprim (BACTRIM DS,SEPTRA DS) 800-160 MG tablet Error    Patient Active Problem List   Diagnosis Date Noted  . Pain in joint, lower leg 03/02/2014  . Discoloration of skin-Left dorsum foot 09/28/2013  . Swelling of limb-Left Calf / Leg 09/28/2013  . PVD (peripheral vascular disease) ([place]) 10/14/2012  . Pain in limb- Left popliteal and calf 10/14/2012  . Hyperlipidemia   . Essential hypertension, benign 03/05/2012  . 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 History of arterial bypass of lower limb 12/25/2010  . Atherosclerosis of native arteries of the extremities with intermittent claudication 12/25/2010  . Coronary atherosclerosis of native coronary artery 04/16/2008  . PERIPHERAL VASCULAR DISEASE 04/16/2008  . CHRONIC OBSTRUCTIVE PULMONARY DISEASE 04/16/2008  . GASTROESOPHAGEAL REFLUX DISEASE 04/16/2008  .  RHEUMATOID ARTHRITIS 04/16/2008  . DIABETES MELLITUS, BORDERLINE 04/16/2008    LABS    Component Value Date/Time   NA 137 01/13/2012 1014   NA 141 10/03/2011 0728   NA 138 09/12/2011 1046   K 4.0 01/13/2012 1014   K 4.6 10/03/2011 0728   K 4.2 09/12/2011 1046   CL 101 01/13/2012 1014   CL 104 10/03/2011 0728   CL 104 09/12/2011 1046   CO2 29 01/13/2012 1014   CO2 28 09/12/2011 1046   CO2 30 08/12/2011 0904   GLUCOSE 266 (H) 01/13/2012 1014   GLUCOSE 173 (H) 10/03/2011 0728   GLUCOSE 137 (H) 09/12/2011 1046   BUN 12 01/13/2012 1014   BUN 20 10/03/2011 0728   BUN 12 09/12/2011 1046   CREATININE 1.0 01/13/2012 1014   CREATININE 0.90 10/03/2011 0728   CREATININE 0.8 09/12/2011 1046   CALCIUM 9.0 01/13/2012 1014   CALCIUM 9.0  09/12/2011 1046   CALCIUM 8.9 08/12/2011 0904   GFRNONAA >60 06/24/2010 0435   GFRNONAA >60 06/23/2010 0400   GFRNONAA >60 06/20/2010 0936   GFRAA >60 06/24/2010 0435   GFRAA >60 06/23/2010 0400   GFRAA >60 06/20/2010 0936   CMP     Component Value Date/Time   NA 137 01/13/2012 1014   K 4.0 01/13/2012 1014   CL 101 01/13/2012 1014   CO2 29 01/13/2012 1014   GLUCOSE 266 (H) 01/13/2012 1014   BUN 12 01/13/2012 1014   CREATININE 1.0 01/13/2012 1014   CALCIUM 9.0 01/13/2012 1014   PROT 7.0 06/20/2010 0936   ALBUMIN 4.0 06/20/2010 0936   AST 19 06/20/2010 0936   ALT 21 06/20/2010 0936   ALKPHOS 72 06/20/2010 0936   BILITOT 0.3 06/20/2010 0936   GFRNONAA >60 06/24/2010 0435   GFRAA >60 06/24/2010 0435       Component Value Date/Time   WBC 9.1 07/24/2011 1426   WBC 8.0 06/24/2010 0435   WBC 8.8 06/23/2010 0400   HGB 15.0 10/03/2011 0728   HGB 14.3 07/24/2011 1426   HGB 13.0 06/24/2010 0435   HCT 44.0 10/03/2011 0728   HCT 43.8 07/24/2011 1426   HCT 39.2 06/24/2010 0435   MCV 91.4 07/24/2011 1426   MCV 89.9 06/24/2010 0435   MCV 90.6 06/23/2010 0400    Lipid Panel  No results found for: CHOL, TRIG, HDL, CHOLHDL, VLDL, LDLCALC, LDLDIRECT  ABG    Component Value Date/Time   TCO2 26 10/03/2011 0728     No results found for: TSH BNP (last 3 results) No results for input(s): BNP in the last 8760 hours.  ProBNP (last 3 results) No results for input(s): PROBNP in the last 8760 hours.  Cardiac Panel (last 3 results) No results for input(s): CKTOTAL, CKMB, TROPONINI, RELINDX in the last 72 hours.  Iron/TIBC/Ferritin/ %Sat No results found for: IRON, TIBC, FERRITIN, IRONPCTSAT   EKG Orders placed or performed in visit on 06/16/15  . EKG 12-Lead     Prior Assessment and Plan Problem List as of 10/05/2015 Reviewed: 09/29/2015  2:59 PM by Viann Fish, NP     Cardiovascular and Mediastinum   Coronary atherosclerosis of native coronary artery   Last Assessment  & Plan 09/03/2012 Office Visit Written 09/03/2012  9:03 AM by Satira Sark, MD    No clear-cut angina with history of mild nonobstructive CAD most recently assessed in July 2013. Plan is to continue medical therapy.      PERIPHERAL VASCULAR DISEASE   Last Assessment &  Plan 07/02/2012 Office Visit Written 07/02/2012  1:34 PM by Donney Dice, PA-C    Followed by Dr. Kellie Simmering, in [place]      Atherosclerosis of native arteries of the extremities with intermittent claudication   Last Assessment & Plan 03/05/2012 Office Visit Written 03/05/2012  1:54 PM by Satira Sark, MD    Keep followup with Dr. Kellie Simmering.      Secondary cardiomyopathy, unspecified   Last Assessment & Plan 09/03/2012 Office Visit Written 09/03/2012  9:03 AM by Satira Sark, MD    LVEF 45-50%, no heart failure symptoms. Blood pressure well controlled. Continue medical therapy.      Essential hypertension, benign   Last Assessment & Plan 09/03/2012 Office Visit Written 09/03/2012  9:04 AM by Satira Sark, MD    Blood pressure control looks good today.      PVD (peripheral vascular disease) ([place])     Respiratory   CHRONIC OBSTRUCTIVE PULMONARY DISEASE   Last Assessment & Plan 07/02/2012 Office Visit Written 07/02/2012  1:33 PM by Donney Dice, PA-C    Patient quit smoking approximately one year ago.        Digestive   GASTROESOPHAGEAL REFLUX DISEASE     Musculoskeletal and Integument   RHEUMATOID ARTHRITIS     Other   DIABETES MELLITUS, BORDERLINE   History of arterial bypass of lower limb   Last Assessment & Plan 06/24/2011 Office Visit Written 09/15/2011  1:27 PM by Ezra Sites, MD    Status post left femoropopliteal 2007  Dr. Kellie Simmering is status post redo left femoropopliteal followup by vascular surgery.      Hyperlipidemia   Last Assessment & Plan 07/02/2012 Office Visit Written 07/02/2012  1:33 PM by Donney Dice, PA-C    Recommended low intensity statin therapy, with target LDL of 100 or less,  if feasible.      Pain in limb- Left popliteal and calf   Discoloration of skin-Left dorsum foot   Swelling of limb-Left Calf / Leg   Pain in joint, lower leg       Imaging: No results found.

## 2015-10-05 NOTE — Progress Notes (Signed)
Cardiology Office Note   Date:  10/05/2015   ID:  [name], DOB 1950/10/30, MRN [id number]  PCP:  [name], MD  Cardiologist: [place]/  Jory Sims, NP   No chief complaint on file.     History of Present Illness: [name] is a 65 y.o. male who presents for ongoing assessment and management of coronary artery disease, nonobstructive by cardiac catheterization in 2013, peripheral arterial disease status post left femoropopliteal bypass 2007 was PTA and stenting of the right iliac artery in 2013, hypertension, hyperlipidemia, with known history of rheumatoid arthritis GERD and COPD.  Patient was last seen by Dr. Domenic Polite in the [place] office, at which time the patient was symptomatically stable.   He is here today with complaints of left-sided neck pain shooting pain into his chest. 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 He also has been noticing that his heart stent racing. He admits to drinking good bit of caffeine during the day coffee in the morning and at night and ice tea throughout the day.  He denies exertional chest pain dyspnea on exertion or significant fatigue. He is back to walking 30 minutes every day. He has had recent femoropopliteal surgery on the left and is regaining his strength and is not having any pain. Past Medical History:  Diagnosis Date  . Bronchitis   . Carotid artery occlusion   . Cervical spine disease    Neck surgery in 2007 with Dr. Joya Salm  . COPD (chronic obstructive pulmonary disease) ([place])   . Coronary atherosclerosis of native coronary artery    Nonobstrtuctive by catheterization 2007 and 2013  . DVT (deep venous thrombosis) ([place])   . Essential hypertension, benign   . GERD (gastroesophageal reflux disease)   . Hyperlipidemia   . PAD (peripheral artery disease) ([place])    s/p left fem-pop bypass 2007; PTA and stenting of right iliac artery 09/2011  . Rheumatoid arthritis(714.0)   . Type 2 diabetes  mellitus ([place])     Past Surgical History:  Procedure Laterality Date  . ABDOMINAL AORTAGRAM N/A 10/03/2011   Procedure: ABDOMINAL Maxcine Ham;  Surgeon: Conrad Freedom, MD;  Location: [facility] CATH LAB;  Service: Cardiovascular;  Laterality: N/A;  . FEMORAL-POPLITEAL BYPASS GRAFT  11/06/2005  . ILIAC ARTERY STENT  10-03-11   Right CIA stenting  . PR VEIN BYPASS GRAFT,AORTO-FEM-POP  06-22-2010   Redo Left Fem-Pop  . Repair of left arm fracture    . 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 SURGERY SCROTAL / TESTICULAR       Current Outpatient Prescriptions  Medication Sig Dispense Refill  . atorvastatin (LIPITOR) 20 MG tablet Take 20 mg by mouth daily.    . celecoxib (CELEBREX) 200 MG capsule Take 1 capsule (200 mg total) by mouth daily. (HOLD X 5 DAYS WHILE TAKING THE IBUPROFEN)    . CIALIS 5 MG tablet   0  . clopidogrel (PLAVIX) 75 MG tablet Take 1 tablet (75 mg total) by mouth daily. 30 tablet 11  . glipiZIDE (GLUCOTROL) 10 MG tablet Take 5 mg by mouth 2 (two) times daily before a meal.     . HYDROcodone-acetaminophen (NORCO) 7.5-325 MG per tablet Take 1 tablet by mouth 2 (two) times daily as needed.    Marland Kitchen JANUVIA 100 MG tablet Take 1 tablet by mouth daily.    Marland Kitchen lisinopril (PRINIVIL,ZESTRIL) 2.5 MG tablet Take 2.5 mg by mouth daily.     . metFORMIN (GLUCOPHAGE) 500 MG tablet Take  1,000 mg by mouth 2 (two) times daily with a meal.     . nitroGLYCERIN (NITROSTAT) 0.4 MG SL tablet Place 1 tablet (0.4 mg total) under the tongue every 5 (five) minutes as needed for chest pain. 90 tablet 3  . ONE TOUCH ULTRA TEST test strip     . PROAIR HFA 108 (90 BASE) MCG/ACT inhaler Inhale 2 puffs into the lungs every 4 (four) hours as needed.     . VOLTAREN 1 % GEL Apply 1 application topically as needed.      No current facility-administered medications for this visit.     Allergies:   Review of patient's allergies indicates no known allergies.    Social History:  The patient  reports that he quit smoking about 4  years ago. His smoking use included Cigarettes and Cigars. He has a 4.00 pack-year smoking history. He has never used smokeless tobacco. He reports that he drinks alcohol. He reports that he does not use drugs.   Family History:  The patient's family history includes Cancer in his brother and son; Deep vein thrombosis in his brother and mother; Diabetes in his brother, father, mother, and sister; Heart attack (age of onset: 62) in his mother; Hyperlipidemia in his brother, father, mother, sister, and son; Hypertension in his brother, father, mother, sister, and son; Peripheral vascular disease in his brother, father, and mother; Stroke in his father.    ROS: All other systems are reviewed and negative. Unless otherwise mentioned in H&P    PHYSICAL EXAM: VS:  BP 134/78   Pulse (!) 105   Ht 5\' 10"  (1.778 m)   Wt 176 lb (79.8 kg)   SpO2 95%   BMI 25.25 kg/m  , BMI Body mass index is 25.25 kg/m. GEN: Well nourished, well developed, in no acute distress  HEENT: normal  Neck: no JVD, carotid bruits, or masses. Significant stiffness on the left with range of motion. Cardiac: RRR; no murmurs, rubs, or gallops,no edema  Respiratory:  Clear to auscultation bilaterally, normal work of breathing GI: soft, nontender, nondistended, + BS MS: no deformity or atrophy no pain in his left leg postoperatively, with range of motion or palpation  Skin: warm and dry, no rash Neuro:  Strength and sensation are intact Psych: euthymic mood, full affect    Wt Readings from Last 3 Encounters:  10/05/15 176 lb (79.8 kg)  09/29/15 174 lb (78.9 kg)  06/16/15 181 lb (82.1 kg)      ASSESSMENT AND PLAN:  1. Left-sided neck pain: Atypical from cardiac etiology. Pain with range of motion. He has been advised to follow-up with his orthopedist or primary care physician for ongoing management. He is reassured that this is not consistent with coronary artery disease or angina. He will continue his current medication  regimen of  metoprolol, and lisinopril.  2. PAD: Most recent aorto iliac bypass on the left with percutaneous stent intervention per Dr. Bridgett Larsson. He continues to walk daily and is regaining his strength in his legs he denies any significant pain. He continues on ELIQUIS 5 mg twice a day.   3. Hypercholesterolemia: Continue statin therapy with atorvastatin 20 mg daily. Follow-up with primary care for ongoing labs.   Current medicines are reviewed at length with the patient today.    Labs/ tests ordered today include:  No orders of the defined types were placed in this encounter.    Disposition:   FU with 6 months Dr. Manus Gunning office  Signed, Curt Bears  Purcell Nails, NP  10/05/2015 2:57 PM    [facility] [address] [place], [place] [address] Phone: [phone]; Fax: [phone]

## 2015-10-06 ENCOUNTER — Ambulatory Visit: Payer: Medicare Other | Admitting: Family

## 2015-10-13 DIAGNOSIS — M898X9 Other specified disorders of bone, unspecified site: Secondary | ICD-10-CM | POA: Insufficient documentation

## 2015-12-06 ENCOUNTER — Telehealth: Payer: Self-pay

## 2015-12-06 ENCOUNTER — Ambulatory Visit (INDEPENDENT_AMBULATORY_CARE_PROVIDER_SITE_OTHER): Payer: Medicare Other | Admitting: Family

## 2015-12-06 ENCOUNTER — Ambulatory Visit (HOSPITAL_COMMUNITY)
Admission: RE | Admit: 2015-12-06 | Discharge: 2015-12-06 | Disposition: A | Discharge status: Home / Self Care | Payer: Medicare Other | Source: Ambulatory Visit | Attending: Family | Admitting: Family

## 2015-12-06 ENCOUNTER — Other Ambulatory Visit: Payer: Self-pay | Admitting: *Deleted

## 2015-12-06 ENCOUNTER — Encounter: Payer: Self-pay | Admitting: Family

## 2015-12-06 VITALS — BP 120/72 | HR 80 | Temp 97.1°F | Resp 16 | Ht 69.0 in | Wt 175.0 lb

## 2015-12-06 DIAGNOSIS — E1151 Type 2 diabetes mellitus with diabetic peripheral angiopathy without gangrene: Secondary | ICD-10-CM

## 2015-12-06 DIAGNOSIS — Z87891 Personal history of nicotine dependence: Secondary | ICD-10-CM

## 2015-12-06 DIAGNOSIS — I779 Disorder of arteries and arterioles, unspecified: Secondary | ICD-10-CM

## 2015-12-06 DIAGNOSIS — Z95828 Presence of other vascular implants and grafts: Secondary | ICD-10-CM

## 2015-12-06 DIAGNOSIS — M7989 Other specified soft tissue disorders: Secondary | ICD-10-CM

## 2015-12-06 DIAGNOSIS — M79605 Pain in left leg: Secondary | ICD-10-CM | POA: Diagnosis not present

## 2015-12-06 NOTE — Telephone Encounter (Signed)
Discussed with Dr. Bridgett Larsson.  Recommended to schedule for Left LE Venous Duplex, and appt. with NP today.  Pt. Notified of appt./ agreed.

## 2015-12-06 NOTE — Progress Notes (Signed)
[facility] [place]   CC: Follow up peripheral artery occlusive disease  History of Present Illness [name] is a 65 y.o. male patient of Dr. Kellie Simmering and Dr. Bridgett Larsson who has a history of lower extremity arterial occlusive disease. He previously had a left femoral-popliteal bypass graft by Dr. Kellie Simmering in 2007 and revision of the graft in June of 2012. He also had right common iliac PTA and stenting by Dr. Bridgett Larsson September 2013.  He returns today with c/o 3 days hx of swelling and pain in medial aspect of left thigh, started after a 7 hour car trip. He denies dyspnea.   He walks 20-30 minutes, 3 days/week; this is more walking than he had been doing.  His right hip and thigh have been hurting more int he morning, but as he walks more this improves.   He denies hx of MI, positive for atrial fib. in the past.  He states that he was told that he had a "light stroke" in the remote past, pointed to behind hid right ear; no carotid Duplex on file, pt states no procedure would have helped or could be done.  He was evaluated at [facility] ED and found to have C7 issues.  Pt Diabetic: Yes, 6.5 A1C in February 2017 per pt, improved, was previously uncontrolled  Pt smoker: quit October, 2013  Pt meds include:  Statin :Yes ASA: No Other anticoagulants/antiplatelets: Plavix    Past Medical History:  Diagnosis Date  . Bronchitis   . Carotid artery occlusion   . Cervical spine disease    Neck surgery in 2007 with Dr. Joya Salm  . COPD (chronic obstructive pulmonary disease) ([place])   . Coronary atherosclerosis of native coronary artery    Nonobstrtuctive by catheterization 2007 and 2013  . DVT (deep venous thrombosis) ([place])   . Essential hypertension, benign   . GERD (gastroesophageal reflux disease)   . Hyperlipidemia   . PAD (peripheral artery disease) ([place])    s/p left fem-pop bypass 2007; PTA and stenting of right iliac artery 09/2011  . Rheumatoid  arthritis(714.0)   . Type 2 diabetes mellitus ([place])     Social History Social History  Substance Use Topics  . Smoking status: Former Smoker    Packs/day: 0.10    Years: 40.00    Types: Cigarettes, Cigars    Quit date: 09/01/2011  . Smokeless tobacco: Never Used  . Alcohol use 0.0 oz/week     Comment: occasional beer    Family History Family History  Problem Relation Age of Onset  . Heart attack Mother 23  . Deep vein thrombosis Mother   . Diabetes Mother   . Hyperlipidemia Mother   . Hypertension Mother   . Peripheral vascular disease Mother   . Stroke Father   . Diabetes Father   . Hyperlipidemia Father   . Hypertension Father   . Peripheral vascular disease Father   . Diabetes Sister   . Hyperlipidemia Sister   . Hypertension Sister   . Diabetes Brother   . Hyperlipidemia Brother   . Cancer Brother   . Deep vein thrombosis Brother   . Hypertension Brother   . Peripheral vascular disease Brother   . Hyperlipidemia Son   . Cancer Son   . Hypertension Son     Past Surgical History:  Procedure Laterality Date  . ABDOMINAL AORTAGRAM N/A 10/03/2011   Procedure: ABDOMINAL Maxcine Ham;  Surgeon: Conrad Hopkinton, MD;  Location: [facility] CATH LAB;  Service: Cardiovascular;  Laterality: N/A;  . FEMORAL-POPLITEAL BYPASS GRAFT  11/06/2005  . ILIAC ARTERY STENT  10-03-11   Right CIA stenting  . PR VEIN BYPASS GRAFT,AORTO-FEM-POP  06-22-2010   Redo Left Fem-Pop  . Repair of left arm fracture    . 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 SURGERY SCROTAL / TESTICULAR      No Known Allergies  Current Outpatient Prescriptions  Medication Sig Dispense Refill  . atorvastatin (LIPITOR) 20 MG tablet Take 20 mg by mouth daily.    . celecoxib (CELEBREX) 200 MG capsule Take 1 capsule (200 mg total) by mouth daily. (HOLD X 5 DAYS WHILE TAKING THE IBUPROFEN)    . CIALIS 5 MG tablet   0  . clopidogrel (PLAVIX) 75 MG tablet Take 1 tablet (75 mg total) by mouth daily. 30 tablet 11  . glipiZIDE  (GLUCOTROL) 10 MG tablet Take 5 mg by mouth 2 (two) times daily before a meal.     . HYDROcodone-acetaminophen (NORCO) 7.5-325 MG per tablet Take 1 tablet by mouth 2 (two) times daily as needed.    Marland Kitchen JANUVIA 100 MG tablet Take 1 tablet by mouth daily.    Marland Kitchen lisinopril (PRINIVIL,ZESTRIL) 2.5 MG tablet Take 2.5 mg by mouth daily.     . metFORMIN (GLUCOPHAGE) 500 MG tablet Take 1,000 mg by mouth 2 (two) times daily with a meal.     . ONE TOUCH ULTRA TEST test strip     . PROAIR HFA 108 (90 BASE) MCG/ACT inhaler Inhale 2 puffs into the lungs every 4 (four) hours as needed.     . testosterone cypionate (DEPOTESTOSTERONE CYPIONATE) 200 MG/ML injection     . DEXILANT 30 MG capsule     . nitroGLYCERIN (NITROSTAT) 0.4 MG SL tablet Place 1 tablet (0.4 mg total) under the tongue every 5 (five) minutes as needed for chest pain. (Patient not taking: Reported on 12/06/2015) 90 tablet 3  . VOLTAREN 1 % GEL Apply 1 application topically as needed.      No current facility-administered medications for this visit.     ROS: See HPI for pertinent positives and negatives.   Physical Examination  Vitals:   12/06/15 1544 12/06/15 1546  BP: 126/76 120/72  Pulse: 80   Resp: 16   Temp: 97.1 F (36.2 C)   SpO2: 95%   Weight: 175 lb (79.4 kg)   Height: 5\' 9"  (1.753 m)    Body mass index is 25.84 kg/m.  General: A&O x 3, WDWN Gait: normal  Eyes: PERRLA,  Pulmonary: Respirations are non labored Cardiac: regular rhythm and rate   Aorta: not palpable  Radial pulses: 2+ palpable and =.   VASCULAR EXAM: Extremitieswithout ischemic changes  without Gangrene; without open wounds, no edema, no erythema nor streaking.   LE Pulses  LEFT  RIGHT   FEMORAL  3+palpable 2+palpable   POPLITEAL  not palpable  not palpable  POSTERIOR TIBIAL  not palpable  not palpable   DORSALIS PEDIS ANTERIOR TIBIAL  1+ palpable  Faintly palpable    Abdomen:  soft, NT, no masses palpated.  Skin: no rashes, no ulcers.  Musculoskeletal: no muscle wasting or atrophy.  Neurologic: A&O X 3; Appropriate Affect, MOTOR FUNCTION: moving all extremities equally, motor strength 5/5 throughout. Speech is fluent/normal. CN 2-12 intact except hard of hearing.     ASSESSMENT: [name] is a 65 y.o. male whois s/p left femoral-popliteal bypass graft by Dr. Kellie Simmering in 2007 and revision of the graft in June  of 2012. He also had right common iliac PTA and stenting by Dr. Bridgett Larsson September 2013.  He returns today with c/o 3 days hx of swelling and pain in medial aspect of left thigh, started after a 7 hour car trip. He denies dyspnea.   Pt reports remote history of TIA. He reports pain in the right hip on awakening which improves with walking; this is c/w pain secondary to OA, not claudication.  He does not seem to have claudication in his left lower extremity.  There are no signs of ischemia in his feet/legs.  He is walking more.   DATA Left LE venous duplex today with no evidence of deep or superficial vein thrombosis. Patent bypass graft.   09-29-15 Carotid duplex suggests minimal bilateral ICA stenoses, antegrade vertebral arteries, and multiphasic subclavian arteries (normal). No significant change compared to the exam on 09/23/14.  [id number] Right iliac artery stent duplex demonstrates limited visualization due to overlying bowel gas. Patent right iliac common iliac artery stent with no evidence of restenosis. No significant change compared to exam of 09/23/14.   09-29-15 Left LE arterial duplex demonstrates no evidence of restenosis in the bypass graft; no significant change from 09/23/14 exam.   09-29-15 Right ABI with suprasytolic pressures due to non compressible vessels, likely secondary to medial calcification. Right DP is triphasic, PT is dampened and biphasic.  Left ABI is 0.95 with biphasic waveforms.  PLAN:  Graduated walking  program reinforced, and how to achieve.  Based on the patient's vascular studies and examination, pt will return to clinic in March 2018 with ABI's. Will recheck right iliac artery stent duplex, left LE arterial duplex, and carotid duplex a year after his September 2017 visit.   I discussed in depth with the patient the nature of atherosclerosis, and emphasized the importance of maximal medical management including strict control of blood pressure, blood glucose, and lipid levels, obtaining regular exercise, and continued cessation of smoking.  The patient is aware that without maximal medical management the underlying atherosclerotic disease process will progress, limiting the benefit of any interventions.  The patient was given information about PAD including signs, symptoms, treatment, what symptoms should prompt the patient to seek immediate medical care, and risk reduction measures to take.  Clemon Chambers, RN, MSN, FNP-C [facility] [employer] Phone: [phone]  Clinic MD: Early  12/06/15 4:09 PM      Phone call from pt.  Reported swelling of left inner thigh from groin to knee since Sunday.  Reported there is some warmth and tenderness, but denied any redness of area.  Reported the skin is intact.  Also c/o left leg feels weak when walking.  Reported he returned from vacation on Sunday, and had been in a car for about 7 hours.  Reported he has been putting rubbing alcohol on it, and the swelling is a little improved

## 2015-12-06 NOTE — Patient Instructions (Signed)

## 2015-12-06 NOTE — Telephone Encounter (Signed)
Phone call from pt.  Reported swelling of left inner thigh from groin to knee since Sunday.  Reported there is some warmth and tenderness, but denied any redness of area.  Reported the skin is intact.  Also c/o left leg feels weak when walking.  Reported he returned from vacation on Sunday, and had been in a car for about 7 hours.  Reported he has been putting rubbing alcohol on it, and the swelling is a little improved.  Will discuss with MD, and call pt. back.  Agreed.

## 2015-12-11 NOTE — Addendum Note (Signed)
Addended by: Lianne Cure A on: 12/11/2015 11:44 AM   Modules accepted: Orders

## 2016-03-13 ENCOUNTER — Telehealth: Payer: Self-pay

## 2016-03-13 NOTE — Telephone Encounter (Signed)
Phone call from pt.  Reported having increased pain in left knee over past week with walking; described as an "aching".  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  Reported he has occas. Discomfort in left thigh, but doesn't associate it with walking.  Denied rest pain.  Denied swelling of left LE.  Denied open sores of lower extremities.  Stated he feels some numbness in the left knee area.  Denied any symptoms with right lower extremity.  Advised to report the aching of left knee to PCP.  Also advised will have a Scheduler contact him to move his f/u appt. To an earlier date to eval. circulation.  Agreed with plan.

## 2016-03-14 NOTE — Telephone Encounter (Signed)
Moved by Ebony Hail to 3/15 and 3/23

## 2016-03-15 ENCOUNTER — Other Ambulatory Visit: Payer: Self-pay

## 2016-03-15 DIAGNOSIS — I739 Peripheral vascular disease, unspecified: Secondary | ICD-10-CM

## 2016-03-18 ENCOUNTER — Encounter: Payer: Self-pay | Admitting: Vascular Surgery

## 2016-03-21 ENCOUNTER — Ambulatory Visit (HOSPITAL_COMMUNITY)
Admission: RE | Admit: 2016-03-21 | Discharge: 2016-03-21 | Disposition: A | Discharge status: Home / Self Care | Payer: Medicare Other | Source: Ambulatory Visit | Attending: Vascular Surgery | Admitting: Vascular Surgery

## 2016-03-21 DIAGNOSIS — I739 Peripheral vascular disease, unspecified: Secondary | ICD-10-CM

## 2016-03-25 NOTE — Progress Notes (Addendum)
Established Intermittent Claudication  History of Present Illness  [name] is a 66 y.o. ([birth year]) male who presents with chief complaint: Left knee pain.  Pt's sx are more c/w mechanical issues with the joint.    Prior procedures included:  1.  R SIA+S R CIA (10/03/11) 2.  Redo L CFA to AK pop BPG (06/23/10) by Dr. Kellie Simmering 3.  L CFA to AK pop BPG (11/06/05) by Dr. Kellie Simmering  The patient's denies intermittent claudication symptoms.   The patient's symptoms are: L knee pain with motion and posterior knee pain.  The patient's treatment regimen currently included: maximal medical management.  The patient's PMH, PSH, and SH, and FamHx are unchanged from 12/06/15.  Current Outpatient Prescriptions  Medication Sig Dispense Refill  . atorvastatin (LIPITOR) 20 MG tablet Take 20 mg by mouth daily.    . celecoxib (CELEBREX) 200 MG capsule Take 1 capsule (200 mg total) by mouth daily. (HOLD X 5 DAYS WHILE TAKING THE IBUPROFEN)    . CIALIS 5 MG tablet   0  . clopidogrel (PLAVIX) 75 MG tablet Take 1 tablet (75 mg total) by mouth daily. 30 tablet 11  . DEXILANT 30 MG capsule     . glipiZIDE (GLUCOTROL) 10 MG tablet Take 5 mg by mouth 2 (two) times daily before a meal.     . HYDROcodone-acetaminophen (NORCO) 7.5-325 MG per tablet Take 1 tablet by mouth 2 (two) times daily as needed.    Marland Kitchen JANUVIA 100 MG tablet Take 1 tablet by mouth daily.    Marland Kitchen lisinopril (PRINIVIL,ZESTRIL) 2.5 MG tablet Take 2.5 mg by mouth daily.     . metFORMIN (GLUCOPHAGE) 500 MG tablet Take 1,000 mg by mouth 2 (two) times daily with a meal.     . nitroGLYCERIN (NITROSTAT) 0.4 MG SL tablet Place 1 tablet (0.4 mg total) under the tongue every 5 (five) minutes as needed for chest pain. (Patient not taking: Reported on 12/06/2015) 90 tablet 3  . ONE TOUCH ULTRA TEST test strip     . PROAIR HFA 108 (90 BASE) MCG/ACT inhaler Inhale 2 puffs into the lungs every 4 (four) hours as needed.     . testosterone cypionate  (DEPOTESTOSTERONE CYPIONATE) 200 MG/ML injection     . VOLTAREN 1 % GEL Apply 1 application topically as needed.      No current facility-administered medications for this visit.     No Known Allergies  On ROS today: no rest pain, no intermittent claudication    Physical Examination  Vitals:   03/29/16 0909  BP: (!) 143/96  Pulse: 72  Resp: 16  Temp: 97.1 F (36.2 C)  TempSrc: Oral  SpO2: 95%  Weight: 177 lb (80.3 kg)  Height: 5\' 9"  (1.753 m)    Body mass index is 26.14 kg/m.  General: Alert, O x 3, WD,NAD  Pulmonary: Sym exp, good B air movt,CTA B  Cardiac: RRR, Nl S1, S2, no Murmurs, No rubs, No S3,S4  Vascular: Vessel Right Left  Radial Palpable Palpable  Brachial Palpable Palpable  Carotid Palpable, No Bruit Palpable, No Bruit  Aorta Not palpable N/A  Femoral Palpable Palpable  Popliteal Not palpable Not palpable  PT Palpable Palpable  DP Palpable Palpable   Gastrointestinal: soft, non-distended, non-tender to palpation, No guarding or rebound, no HSM, no masses, no CVAT B, No palpable prominent aortic pulse,    Musculoskeletal: M/S 5/5 throughout , Extremities without ischemic changes  , No edema present,  , No LDS present,  healing surgical incisions, mild crepitus in L hip, stable L knee, no varus or valgus laxity  Neurologic: CN 2-12 intact , Pain and light touch intact in extremities , Motor exam as listed above   Non-Invasive Vascular Imaging ABI (Date: 03/25/2016)  R:   ABI: 1.51 ([place]),   DP: tri  PT: tri  TBI:  0.83  L:   ABI: 1.21 (),   DP: bi  PT: bi  TBI: 0.76   Medical Decision Making  [name] is a 66 y.o. male who presents with:  left leg intermittent claudication without evidence of critical limb ischemia, s/p Redo L fem-AK pop BPG, R SIA+S R CIA   Based on the patient's vascular studies and examination, I have offered the patient: q3 month ABI and L bypass duplex.  This patient had concerns with the long-term  longevity of this graft given his prior graft failed in roughly this time span, so he requested more frequent surveillance..  I discussed in depth with the patient the nature of atherosclerosis, and emphasized the importance of maximal medical management including strict control of blood pressure, blood glucose, and lipid levels, antiplatelet agents, obtaining regular exercise, and cessation of smoking.    The patient is aware that without maximal medical management the underlying atherosclerotic disease process will progress, limiting the benefit of any interventions. The patient is currently on a statin: Lipitor.  The patient is currently on an anti-platelet: Plavix.  Pt may need refer to Ortho for further evaluation for possible osteoarthritis in L leg.  Thank you for allowing [place] to participate in this patient's care.   Adele Barthel, MD, FACS [facility] [place] Office: [phone] Pager: [phone]

## 2016-03-29 ENCOUNTER — Encounter: Payer: Self-pay | Admitting: Vascular Surgery

## 2016-03-29 ENCOUNTER — Ambulatory Visit (INDEPENDENT_AMBULATORY_CARE_PROVIDER_SITE_OTHER): Payer: Medicare Other | Admitting: Vascular Surgery

## 2016-03-29 VITALS — BP 143/96 | HR 72 | Temp 97.1°F | Resp 16 | Ht 69.0 in | Wt 177.0 lb

## 2016-03-29 DIAGNOSIS — I70212 Atherosclerosis of native arteries of extremities with intermittent claudication, left leg: Secondary | ICD-10-CM

## 2016-03-29 DIAGNOSIS — I739 Peripheral vascular disease, unspecified: Secondary | ICD-10-CM | POA: Diagnosis not present

## 2016-04-02 NOTE — Addendum Note (Signed)
Addended by: Lianne Cure A on: 04/02/2016 09:57 AM   Modules accepted: Orders

## 2016-04-05 ENCOUNTER — Encounter: Payer: Self-pay | Admitting: Cardiology

## 2016-04-05 ENCOUNTER — Ambulatory Visit (INDEPENDENT_AMBULATORY_CARE_PROVIDER_SITE_OTHER): Payer: Medicare Other | Admitting: Cardiology

## 2016-04-05 VITALS — BP 141/92 | HR 68 | Ht 70.0 in | Wt 175.0 lb

## 2016-04-05 DIAGNOSIS — E1151 Type 2 diabetes mellitus with diabetic peripheral angiopathy without gangrene: Secondary | ICD-10-CM

## 2016-04-05 DIAGNOSIS — E782 Mixed hyperlipidemia: Secondary | ICD-10-CM

## 2016-04-05 DIAGNOSIS — I2510 Atherosclerotic heart disease of native coronary artery without angina pectoris: Secondary | ICD-10-CM | POA: Diagnosis not present

## 2016-04-05 DIAGNOSIS — I739 Peripheral vascular disease, unspecified: Secondary | ICD-10-CM

## 2016-04-05 NOTE — Patient Instructions (Signed)

## 2016-04-05 NOTE — Progress Notes (Signed)
Cardiology Office Note  Date: 04/05/2016   ID: [name], DOB 1950-01-18, MRN [id number]  PCP: [name], MD  Primary Cardiologist: Rozann Lesches, MD   Chief Complaint  Patient presents with  . Coronary Artery Disease    History of Present Illness: [name] is a 66 y.o. male last seen by Ms. Lawrence NP in September 2017. He presents for a routine follow-up visit. Reports no angina symptoms. He states that he walks at least 2 miles most mornings of the week. Has NYHA class II dyspnea which is stable. He has also had some other orthopedic complaints including left hip pain, back pain after doing some straining recently. He has also had some muscle fasciculations in his left forearm.  He continues to follow with Dr. Bridgett Larsson in the [facility] office for PAD and prior revascularization operations. I reviewed the most recent office note from March 23. Recent ABIs are in the chart.  I reviewed his medications. Cardiac regimen includes Plavix, Lipitor, lisinopril, and as needed nitroglycerin which he has not required.  Past Medical History:  Diagnosis Date  . Bronchitis   . Carotid artery occlusion   . Cervical spine disease    Neck surgery in 2007 with Dr. Joya Salm  . COPD (chronic obstructive pulmonary disease) ([place])   . Coronary atherosclerosis of native coronary artery    Nonobstrtuctive by catheterization 2007 and 2013  . DVT (deep venous thrombosis) ([place])   . Essential hypertension, benign   . GERD (gastroesophageal reflux disease)   . Hyperlipidemia   . PAD (peripheral artery disease) ([place])    s/p left fem-pop bypass 2007; PTA and stenting of right iliac artery 09/2011  . Rheumatoid arthritis(714.0)   . Type 2 diabetes mellitus ([place])     Past Surgical History:  Procedure Laterality Date  . ABDOMINAL AORTAGRAM N/A 10/03/2011   Procedure: ABDOMINAL Maxcine Ham;  Surgeon: Conrad McKinnon, MD;  Location[facility] C[facility] LAB;  Service: Cardiovascular;  Laterality: N/A;  .  FEMORAL-POPLITEAL BYPASS GRAFT  11/06/2005  . ILIAC ARTERY STENT  10-03-11   Right CIA stenting  . PR VEIN BYPASS GRAFT,AORTO-FEM-POP  06-22-2010   Redo Left Fem-Pop  . Repair of left arm fracture    . 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 SURGERY SCROTAL / TESTICULAR      Current Outpatient Prescriptions  Medication Sig Dispense Refill  . atorvastatin (LIPITOR) 20 MG tablet Take 20 mg by mouth daily.    . celecoxib (CELEBREX) 200 MG capsule Take 1 capsule (200 mg total) by mouth daily. (HOLD X 5 DAYS WHILE TAKING THE IBUPROFEN)    . CIALIS 5 MG tablet Take 5 mg by mouth daily as needed.   0  . clopidogrel (PLAVIX) 75 MG tablet Take 1 tablet (75 mg total) by mouth daily. 30 tablet 11  . DEXILANT 30 MG capsule Take 30 mg by mouth daily.     Marland Kitchen glipiZIDE (GLUCOTROL) 10 MG tablet Take 5 mg by mouth 2 (two) times daily before a meal.     . HYDROcodone-acetaminophen (NORCO) 7.5-325 MG per tablet Take 1 tablet by mouth 2 (two) times daily as needed.    Marland Kitchen JANUVIA 100 MG tablet Take 1 tablet by mouth daily.    Marland Kitchen leflunomide (ARAVA) 20 MG tablet Take 30 mg by mouth daily.    Marland Kitchen lisinopril (PRINIVIL,ZESTRIL) 5 MG tablet Take 1 tablet by mouth daily.    . metFORMIN (GLUCOPHAGE) 500 MG tablet Take 500 mg by mouth 2 (  two) times daily with a meal.     . nitroGLYCERIN (NITROSTAT) 0.4 MG SL tablet Place 1 tablet (0.4 mg total) under the tongue every 5 (five) minutes as needed for chest pain. 90 tablet 3  . ONE TOUCH ULTRA TEST test strip     . PROAIR HFA 108 (90 BASE) MCG/ACT inhaler Inhale 2 puffs into the lungs every 4 (four) hours as needed.     . VOLTAREN 1 % GEL Apply 1 application topically as needed.      No current facility-administered medications for this visit.    Allergies:  Patient has no known allergies.   Social History: The patient  reports that he quit smoking about 4 years ago. His smoking use included Cigarettes and Cigars. He started smoking about 49 years ago. He has a 4.00 pack-year  smoking history. He has never used smokeless tobacco. He reports that he drinks alcohol. He reports that he does not use drugs.   Family History: The patient's family history includes Cancer in his brother and son; Deep vein thrombosis in his brother and mother; Diabetes in his brother, father, mother, and sister; Heart attack (age of onset: 34) in his mother; Hyperlipidemia in his brother, father, mother, sister, and son; Hypertension in his brother, father, mother, sister, and son; Peripheral vascular disease in his brother, father, and mother; Stroke in his father.   ROS:  Please see the history of present illness. Otherwise, complete review of systems is positive for none.  All other systems are reviewed and negative.   Physical Exam: VS:  BP (!) 141/92   Pulse 68   Ht 5\' 10"  (1.778 m)   Wt 175 lb (79.4 kg)   SpO2 95% Comment: on RA  BMI 25.11 kg/m , BMI Body mass index is 25.11 kg/m.  Wt Readings from Last 3 Encounters:  04/05/16 175 lb (79.4 kg)  03/29/16 177 lb (80.3 kg)  12/06/15 175 lb (79.4 kg)    General: Patient appears comfortable at rest. HEENT: Conjunctiva and lids normal, oropharynx clear. Neck: No elevated JVP or carotid bruits, no thyromegaly. Lungs: Clear to auscultation, nonlabored breathing at rest. Cardiac: Regular rate and rhythm, S4, no significant systolic murmur, no pericardial rub. Abdomen: Soft, nontender, bowel sounds present, no guarding or rebound. Extremities: No pitting edema, DPs diminished bilaterally. Skin: Warm and dry. Musculoskeletal: No kyphosis. Neuropsychiatric: Alert and oriented 3, affect appropriate.  ECG: I personally reviewed the tracing from 06/16/2015 which showed normal sinus rhythm with sinus arrhythmia.  Recent Labwork:  April 2016: Hemoglobin A1c 7.1, cholesterol 147, triglycerides 73, HDL 46, LDL 86  Other Studies Reviewed Today:  Echocardiogram 11/24/2014: Study Conclusions  - Left ventricle: The cavity size was normal.  Wall thickness was  normal. Systolic function was normal. The estimated ejection  fraction was in the range of 60% to 65%. Doppler parameters are  consistent with abnormal left ventricular relaxation (grade 1  diastolic dysfunction). - Aortic valve: Mildly calcified annulus. Trileaflet; mildly  thickened leaflets. There was mild regurgitation. The AI VC is  0.3 cm. Valve area (VTI): 2.18 cm^2. Valve area (Vmax): 1.73  cm^2. Valve area (Vmean): 2.35 cm^2. Regurgitation pressure  half-time: 546 ms. - Mitral valve: Mildly calcified annulus. Mildly thickened leaflets. - Atrial septum: No defect or patent foramen ovale was identified. - Technically difficult study.  Assessment and Plan:  1. Nonobstructive CAD, no active angina symptoms. Continue Plavix and statin therapy.  2. Severe PAD, followed by Dr. Bridgett Larsson and status post prior  revascularization operations. He continues to walk for exercise. Quit smoking several years ago.  3. Hyperlipidemia, on Lipitor. Continue to follow lipids with Dr. [name].  4. Type 2 diabetes mellitus, on Glucotrol, Glucophage, and Januvia. He follows with Dr. [name].  Current medicines were reviewed with the patient today.   Disposition: Follow-up in 6 months.  Signed, Satira Sark, MD, [facility] 04/05/2016 3:41 PM    [place] [facility] [place], [place], [place] [address] Phone: [phone]; Fax: [phone]

## 2016-04-17 ENCOUNTER — Other Ambulatory Visit: Payer: Self-pay | Admitting: Vascular Surgery

## 2016-04-17 DIAGNOSIS — I739 Peripheral vascular disease, unspecified: Secondary | ICD-10-CM

## 2016-04-17 MED ORDER — CLOPIDOGREL BISULFATE 75 MG PO TABS: 75.0000 mg | ORAL_TABLET | Freq: Every day | ORAL | 11 refills | Status: AC

## 2016-05-31 ENCOUNTER — Inpatient Hospital Stay (HOSPITAL_COMMUNITY)
Admission: RE | Admit: 2016-05-31 | Discharge: 2016-05-31 | Disposition: A | Discharge status: Home / Self Care | Payer: Medicare Other | Source: Ambulatory Visit | Attending: Family | Admitting: Family

## 2016-05-31 ENCOUNTER — Ambulatory Visit: Payer: Medicare Other | Admitting: Family

## 2016-05-31 DIAGNOSIS — I779 Disorder of arteries and arterioles, unspecified: Secondary | ICD-10-CM

## 2016-05-31 DIAGNOSIS — Z95828 Presence of other vascular implants and grafts: Secondary | ICD-10-CM

## 2016-05-31 DIAGNOSIS — E1151 Type 2 diabetes mellitus with diabetic peripheral angiopathy without gangrene: Secondary | ICD-10-CM

## 2016-05-31 DIAGNOSIS — Z87891 Personal history of nicotine dependence: Secondary | ICD-10-CM

## 2016-07-17 ENCOUNTER — Encounter: Payer: Self-pay | Admitting: Vascular Surgery

## 2016-07-22 NOTE — Progress Notes (Signed)
Established Previous Bypass   History of Present Illness   [name] is a 66 y.o. ([birth year]) male who presents with chief complaint: none.   Prior procedures included:  1.  R SIA+S R CIA (10/03/11) 2.  Redo L CFA to AK pop BPG (06/23/10) by Dr. Kellie Simmering 3.  L CFA to AK pop BPG (11/06/05) by Dr. Kellie Simmering.    The patient's symptoms have not progressed.  The patient's symptoms are: none. The patient's treatment regimen currently included: maximal medical management.  The patient's PMH, PSH, SH, and FamHx are unchanged from 03/29/16.  Current Outpatient Prescriptions  Medication Sig Dispense Refill  . atorvastatin (LIPITOR) 20 MG tablet Take 20 mg by mouth daily.    . celecoxib (CELEBREX) 200 MG capsule Take 1 capsule (200 mg total) by mouth daily. (HOLD X 5 DAYS WHILE TAKING THE IBUPROFEN)    . CIALIS 5 MG tablet Take 5 mg by mouth daily as needed.   0  . clopidogrel (PLAVIX) 75 MG tablet Take 1 tablet (75 mg total) by mouth daily. 30 tablet 11  . DEXILANT 30 MG capsule Take 30 mg by mouth daily.     Marland Kitchen glipiZIDE (GLUCOTROL) 10 MG tablet Take 5 mg by mouth 2 (two) times daily before a meal.     . HYDROcodone-acetaminophen (NORCO) 7.5-325 MG per tablet Take 1 tablet by mouth 2 (two) times daily as needed.    Marland Kitchen JANUVIA 100 MG tablet Take 1 tablet by mouth daily.    Marland Kitchen leflunomide (ARAVA) 20 MG tablet Take 30 mg by mouth daily.    Marland Kitchen lisinopril (PRINIVIL,ZESTRIL) 5 MG tablet Take 1 tablet by mouth daily.    . metFORMIN (GLUCOPHAGE) 500 MG tablet Take 500 mg by mouth 2 (two) times daily with a meal.     . nitroGLYCERIN (NITROSTAT) 0.4 MG SL tablet Place 1 tablet (0.4 mg total) under the tongue every 5 (five) minutes as needed for chest pain. 90 tablet 3  . ONE TOUCH ULTRA TEST test strip     . PROAIR HFA 108 (90 BASE) MCG/ACT inhaler Inhale 2 puffs into the lungs every 4 (four) hours as needed.     . VOLTAREN 1 % GEL Apply 1 application topically as needed.      No current  facility-administered medications for this visit.     On ROS today: no intermittent claudication , no wounds   Physical Examination   Vitals:   07/26/16 1020 07/26/16 1022  BP: (!) 142/95 (!) 147/99  Pulse: 92   Resp: 18   Temp: 97.8 F (36.6 C)   TempSrc: Oral   SpO2: 97%   Weight: 164 lb 14.4 oz (74.8 kg)   Height: 5\' 10"  (1.778 m)    Body mass index is 23.66 kg/m.  General Alert, O x 3, WD, NAD  Pulmonary Sym exp, good B air movt, CTA B  Cardiac RRR, Nl S1, S2, no Murmurs, No rubs, No S3,S4  Vascular Vessel Right Left  Radial Palpable Palpable  Brachial Palpable Palpable  Carotid Palpable, No Bruit Palpable, No Bruit  Aorta Not palpable N/A  Femoral Palpable Palpable  Popliteal Not palpable Not palpable  PT Palpable Palpable  DP Palpable Palpable    Gastro- intestinal soft, non-distended, non-tender to palpation, No guarding or rebound, no HSM, no masses, no CVAT B, No palpable prominent aortic pulse,    Musculo- skeletal M/S 5/5 throughout  , Extremities without ischemic changes  , No edema present, No visible  varicosities , No Lipodermatosclerosis present  Neurologic Pain and light touch intact in extremities , Motor exam as listed above    Non-Invasive Vascular Imaging ABI (07/26/2016)  R:   ABI: 1.19 ([place]),   PT: tri  DP: tri  TBI:  0.86  L:   ABI: 1.22 (0.95),   PT: bi  DP: bi  TBI: 0.67  R Aortoiliac Duplex (07/26/2016)  Ao: 60 c/s  R iliac: artery proximal to stent 289 c/s, stent: 212-306 c/s (~0.94)  Bypass Duplex (07/26/2016)  Proximal to bypass: 74 c/s  With-in bypass: 41-99 c/s  Distal to bypass: 49 c/s  Widely patent bypass without stenosis   Medical Decision Making   [name] is a 66 y.o. male who presents with:left leg intermittent claudication without evidence of critical limb ischemia, s/p Redo L fem-AK pop BPG, R SIA+S R CIA   Based on the patient's vascular studies and examination, I have offered the  patient: BLE ABI, bypass duplex, and aortoiliac duplex in 3 months per pt's preference  I discussed in depth with the patient the nature of atherosclerosis, and emphasized the importance of maximal medical management including strict control of blood pressure, blood glucose, and lipid levels, obtaining regular exercise, and cessation of smoking.    The patient is aware that without maximal medical management the underlying atherosclerotic disease process will progress, limiting the benefit of any interventions.  I discussed in depth with the patient the need for long term surveillance to improve the primary assisted patency of his bypass.  The patient agrees to cooperate with such. The patient is currently on a statin: Lipitor.  The patient is currently on an anti-platelet: Plavix.  Thank you for allowing [place] to participate in this patient's care.   Adele Barthel, MD, FACS [facility] [place] Office: [phone] Pager: [phone]

## 2016-07-26 ENCOUNTER — Ambulatory Visit (HOSPITAL_COMMUNITY)
Admission: RE | Admit: 2016-07-26 | Discharge: 2016-07-26 | Disposition: A | Discharge status: Home / Self Care | Payer: Medicare Other | Source: Ambulatory Visit | Attending: Vascular Surgery | Admitting: Vascular Surgery

## 2016-07-26 ENCOUNTER — Encounter: Payer: Self-pay | Admitting: Vascular Surgery

## 2016-07-26 ENCOUNTER — Ambulatory Visit (INDEPENDENT_AMBULATORY_CARE_PROVIDER_SITE_OTHER): Payer: Medicare Other | Admitting: Vascular Surgery

## 2016-07-26 VITALS — BP 147/99 | HR 92 | Temp 97.8°F | Resp 18 | Ht 70.0 in | Wt 164.9 lb

## 2016-07-26 DIAGNOSIS — R9439 Abnormal result of other cardiovascular function study: Secondary | ICD-10-CM | POA: Diagnosis not present

## 2016-07-26 DIAGNOSIS — I708 Atherosclerosis of other arteries: Secondary | ICD-10-CM | POA: Insufficient documentation

## 2016-07-26 DIAGNOSIS — Z95828 Presence of other vascular implants and grafts: Secondary | ICD-10-CM | POA: Diagnosis not present

## 2016-07-26 DIAGNOSIS — I739 Peripheral vascular disease, unspecified: Secondary | ICD-10-CM | POA: Insufficient documentation

## 2016-07-26 DIAGNOSIS — I70212 Atherosclerosis of native arteries of extremities with intermittent claudication, left leg: Secondary | ICD-10-CM

## 2016-07-29 NOTE — Addendum Note (Signed)
Addended by: Lianne Cure A on: 07/29/2016 02:43 PM   Modules accepted: Orders

## 2016-10-11 DIAGNOSIS — M47812 Spondylosis without myelopathy or radiculopathy, cervical region: Secondary | ICD-10-CM | POA: Insufficient documentation

## 2016-10-30 NOTE — Progress Notes (Signed)
Established Previous Bypass   History of Present Illness   [name] is a 66 y.o. ([birth year]) male who presents with chief complaint: none.   Prior procedures included:  1. R SIA+S R CIA (10/03/11) 2. Redo L CFA to AK pop BPG (06/23/10) by Dr. Kellie Simmering 3. L CFA to AK pop BPG (11/06/05) by Dr. Kellie Simmering.    The patient's symptoms have not progressed.  The patient's symptoms are: recent swelling sensation in L calf.  The patient's treatment regimen currently included: maximal medical management.  The patient's PMH, PSH, SH, and FamHx are unchanged from 07/26/16.  Current Outpatient Prescriptions  Medication Sig Dispense Refill  . atorvastatin (LIPITOR) 20 MG tablet Take 20 mg by mouth daily.    . celecoxib (CELEBREX) 200 MG capsule Take 1 capsule (200 mg total) by mouth daily. (HOLD X 5 DAYS WHILE TAKING THE IBUPROFEN)    . CIALIS 5 MG tablet Take 5 mg by mouth daily as needed.   0  . clopidogrel (PLAVIX) 75 MG tablet Take 1 tablet (75 mg total) by mouth daily. 30 tablet 11  . DEXILANT 30 MG capsule Take 30 mg by mouth daily.     Marland Kitchen glipiZIDE (GLUCOTROL) 10 MG tablet Take 5 mg by mouth 2 (two) times daily before a meal.     . HYDROcodone-acetaminophen (NORCO) 7.5-325 MG per tablet Take 1 tablet by mouth 2 (two) times daily as needed.    Marland Kitchen JANUVIA 100 MG tablet Take 1 tablet by mouth daily.    Marland Kitchen leflunomide (ARAVA) 20 MG tablet Take 30 mg by mouth daily.    Marland Kitchen lisinopril (PRINIVIL,ZESTRIL) 5 MG tablet Take 1 tablet by mouth as needed.     . metFORMIN (GLUCOPHAGE) 500 MG tablet Take 500 mg by mouth 2 (two) times daily with a meal.     . nitroGLYCERIN (NITROSTAT) 0.4 MG SL tablet Place 1 tablet (0.4 mg total) under the tongue every 5 (five) minutes as needed for chest pain. 90 tablet 3  . ONE TOUCH ULTRA TEST test strip     . PROAIR HFA 108 (90 BASE) MCG/ACT inhaler Inhale 2 puffs into the lungs every 4 (four) hours as needed.     . VOLTAREN 1 % GEL Apply 1 application  topically as needed.      No current facility-administered medications for this visit.     On ROS today: no intermittent claudication, no wounds   Physical Examination   Vitals:   11/01/16 1003 11/01/16 1006  BP: (!) 161/88 (!) 146/99  Pulse: 74 74  Resp: 16   Temp: 98.4 F (36.9 C)   SpO2: 98%   Weight: 170 lb (77.1 kg)   Height: 5\' 10"  (1.778 m)    Body mass index is 24.39 kg/m.  General Alert, O x 3, WD, NAD  Pulmonary Sym exp, good B air movt, CTA B  Cardiac RRR, Nl S1, S2, no Murmurs, No rubs, No S3,S4  Vascular Vessel Right Left  Radial Palpable Palpable  Brachial Palpable Palpable  Carotid Palpable, No Bruit Palpable, No Bruit  Aorta Not palpable N/A  Femoral Palpable Palpable  Popliteal Not palpable Not palpable  PT Palpable Palpable  DP Palpable Palpable    Gastro- intestinal soft, non-distended, non-tender to palpation, No guarding or rebound, no HSM, no masses, no CVAT B, No palpable prominent aortic pulse,    Musculo- skeletal M/S 5/5 throughout  , Extremities without ischemic changes  , No edema present, No visible varicosities ,  No Lipodermatosclerosis present  Neurologic Pain and light touch intact in extremities , Motor exam as listed above    Non-invasive Vascular Imaging   ABI (11/01/2016)  R:   ABI: [place] (1.19),   PT: tri  DP: tri  TBI:  0.73  L:   ABI: 1.27 (1.22),   PT: tri  DP: bi  TBI: 0.60  R Aortoiliac Duplex (11/01/2016)  Ao: 63 c/s  R iliac:   artery proximal to stent 248 (289 c/s),   stent: 183-315 c/s (212-306 c/s)  Bypass Duplex (11/01/2016)  Proximal to bypass: 75 c/s (74 c/s)  With-in bypass: 51-59 c/s (41-99 c/s)  Distal to bypass: 38 c/s (49 c/s)  idely patent bypass without stenosis   Medical Decision Making   [name] is a 66 y.o. male who presents with:leftleg intermittent claudication without evidence of critical limb ischemia, s/p Redo L fem-AK pop BPG, R SIA+S R  CIA   Velocities on last two sets of aortoiliac indicate progress in disease proximal to the stent (Vr = 5.0).  This patient may need extension of the stent vs DCB of in-stent restenosis. I discussed with the patient the nature of angiographic procedures, especially the limited patencies of any endovascular intervention.   The patient is aware of that the risks of an angiographic procedure include but are not limited to: bleeding, infection, access site complications, renal failure, embolization, rupture of vessel, dissection, arteriovenous fistula, possible need for emergent surgical intervention, possible need for surgical procedures to treat the patient's pathology, anaphylactic reaction to contrast, and stroke and death.   The patient is aware of the risks and agrees to proceed.  He is scheduled for 10 JAN 19.  Based on the patient's vascular studies and examination, I have offered the patient: BLE ABI, bypass duplex, and aortoiliac duplex in 3 months per pt's preference  I discussed in depth with the patient the nature of atherosclerosis, and emphasized the importance of maximal medical management including strict control of blood pressure, blood glucose, and lipid levels, obtaining regular exercise, and cessation of smoking.    The patient is aware that without maximal medical management the underlying atherosclerotic disease process will progress, limiting the benefit of any interventions.  I discussed in depth with the patient the need for long term surveillance to improve the primary assisted patency of his bypass.  The patient agrees to cooperate with such.  The patient is currently on a statin: Lipitor.   The patient is currently on an anti-platelet: Plavix.  Thank you for allowing [place] to participate in this patient's care.   Adele Barthel, MD, FACS [facility] [place] Office: [phone] Pager: [phone]

## 2016-11-01 ENCOUNTER — Ambulatory Visit (HOSPITAL_COMMUNITY)
Admission: RE | Admit: 2016-11-01 | Discharge: 2016-11-01 | Disposition: A | Discharge status: Home / Self Care | Payer: Medicare Other | Source: Ambulatory Visit | Attending: Vascular Surgery | Admitting: Vascular Surgery

## 2016-11-01 ENCOUNTER — Encounter: Payer: Self-pay | Admitting: Vascular Surgery

## 2016-11-01 ENCOUNTER — Ambulatory Visit (INDEPENDENT_AMBULATORY_CARE_PROVIDER_SITE_OTHER): Payer: Medicare Other | Admitting: Vascular Surgery

## 2016-11-01 ENCOUNTER — Ambulatory Visit (INDEPENDENT_AMBULATORY_CARE_PROVIDER_SITE_OTHER)
Admission: RE | Admit: 2016-11-01 | Discharge: 2016-11-01 | Disposition: A | Discharge status: Home / Self Care | Payer: Medicare Other | Source: Ambulatory Visit | Attending: Vascular Surgery | Admitting: Vascular Surgery

## 2016-11-01 ENCOUNTER — Ambulatory Visit: Payer: Medicare Other | Admitting: Vascular Surgery

## 2016-11-01 ENCOUNTER — Encounter (HOSPITAL_COMMUNITY): Payer: Medicare Other

## 2016-11-01 VITALS — BP 146/99 | HR 74 | Temp 98.4°F | Resp 16 | Ht 70.0 in | Wt 170.0 lb

## 2016-11-01 DIAGNOSIS — I70212 Atherosclerosis of native arteries of extremities with intermittent claudication, left leg: Secondary | ICD-10-CM

## 2016-11-01 DIAGNOSIS — Z95828 Presence of other vascular implants and grafts: Secondary | ICD-10-CM | POA: Diagnosis present

## 2016-11-27 ENCOUNTER — Telehealth: Payer: Self-pay | Admitting: *Deleted

## 2016-11-27 NOTE — Telephone Encounter (Signed)
Call to patient and his wife. To be at [facility] admitting at 5:30 am on 01/16/17 for procedure. To hold pm dose metformin on 01/15/17 then no food or drink aftermidnight except may take any heart of blood pressure medications with sips of water. Hold other and hold any diabetes med am of test. Verbalizes understanding.

## 2016-12-11 ENCOUNTER — Other Ambulatory Visit: Payer: Self-pay | Admitting: *Deleted

## 2016-12-24 ENCOUNTER — Other Ambulatory Visit: Payer: Self-pay | Admitting: *Deleted

## 2016-12-25 ENCOUNTER — Telehealth: Payer: Self-pay | Admitting: *Deleted

## 2016-12-25 NOTE — Telephone Encounter (Signed)
Let message for time change for patient to be at hospital admitting department at 7:30 am for procedure on 01/16/2017. Instructed to call back to confirm.

## 2017-01-16 ENCOUNTER — Ambulatory Visit (HOSPITAL_COMMUNITY)
Admission: RE | Admit: 2017-01-16 | Discharge: 2017-01-16 | Disposition: A | Discharge status: Home / Self Care | Payer: Medicare Other | Source: Ambulatory Visit | Attending: Vascular Surgery | Admitting: Vascular Surgery

## 2017-01-16 ENCOUNTER — Encounter (HOSPITAL_COMMUNITY)
Admission: RE | Disposition: A | Discharge status: Home / Self Care | Payer: Self-pay | Source: Ambulatory Visit | Attending: Vascular Surgery

## 2017-01-16 ENCOUNTER — Telehealth: Payer: Self-pay | Admitting: Vascular Surgery

## 2017-01-16 DIAGNOSIS — I10 Essential (primary) hypertension: Secondary | ICD-10-CM | POA: Insufficient documentation

## 2017-01-16 DIAGNOSIS — I70219 Atherosclerosis of native arteries of extremities with intermittent claudication, unspecified extremity: Secondary | ICD-10-CM | POA: Diagnosis present

## 2017-01-16 DIAGNOSIS — Z87891 Personal history of nicotine dependence: Secondary | ICD-10-CM | POA: Diagnosis not present

## 2017-01-16 DIAGNOSIS — J449 Chronic obstructive pulmonary disease, unspecified: Secondary | ICD-10-CM | POA: Diagnosis not present

## 2017-01-16 DIAGNOSIS — I708 Atherosclerosis of other arteries: Secondary | ICD-10-CM | POA: Insufficient documentation

## 2017-01-16 DIAGNOSIS — E119 Type 2 diabetes mellitus without complications: Secondary | ICD-10-CM | POA: Diagnosis not present

## 2017-01-16 DIAGNOSIS — K219 Gastro-esophageal reflux disease without esophagitis: Secondary | ICD-10-CM | POA: Insufficient documentation

## 2017-01-16 DIAGNOSIS — E785 Hyperlipidemia, unspecified: Secondary | ICD-10-CM | POA: Diagnosis not present

## 2017-01-16 DIAGNOSIS — I70212 Atherosclerosis of native arteries of extremities with intermittent claudication, left leg: Secondary | ICD-10-CM | POA: Diagnosis not present

## 2017-01-16 DIAGNOSIS — M069 Rheumatoid arthritis, unspecified: Secondary | ICD-10-CM | POA: Diagnosis not present

## 2017-01-16 DIAGNOSIS — I70201 Unspecified atherosclerosis of native arteries of extremities, right leg: Secondary | ICD-10-CM | POA: Diagnosis not present

## 2017-01-16 DIAGNOSIS — I2510 Atherosclerotic heart disease of native coronary artery without angina pectoris: Secondary | ICD-10-CM | POA: Diagnosis not present

## 2017-01-16 DIAGNOSIS — Z86718 Personal history of other venous thrombosis and embolism: Secondary | ICD-10-CM | POA: Diagnosis not present

## 2017-01-16 HISTORY — PX: PERIPHERAL VASCULAR BALLOON ANGIOPLASTY: CATH118281

## 2017-01-16 HISTORY — PX: ABDOMINAL AORTOGRAM W/LOWER EXTREMITY: CATH118223

## 2017-01-16 LAB — POCT I-STAT, CHEM 8
BUN: 13 mg/dL (ref 6–20)
Calcium, Ion: 1.11 mmol/L — ABNORMAL LOW (ref 1.15–1.40)
Chloride: 105 mmol/L (ref 101–111)
Creatinine, Ser: 0.7 mg/dL (ref 0.61–1.24)
Glucose, Bld: 131 mg/dL — ABNORMAL HIGH (ref 65–99)
HCT: 39 % (ref 39.0–52.0)
Hemoglobin: 13.3 g/dL (ref 13.0–17.0)
Potassium: 4.1 mmol/L (ref 3.5–5.1)
Sodium: 142 mmol/L (ref 135–145)
TCO2: 26 mmol/L (ref 22–32)

## 2017-01-16 LAB — GLUCOSE, CAPILLARY
Glucose-Capillary: 121 mg/dL — ABNORMAL HIGH (ref 65–99)
Glucose-Capillary: 117 mg/dL — ABNORMAL HIGH (ref 65–99)

## 2017-01-16 LAB — POCT ACTIVATED CLOTTING TIME
Activated Clotting Time: 235 seconds
Activated Clotting Time: 175 seconds
Activated Clotting Time: 202 seconds

## 2017-01-16 SURGERY — ABDOMINAL AORTOGRAM W/LOWER EXTREMITY
Anesthesia: LOCAL | Laterality: Right

## 2017-01-16 MED ORDER — SODIUM CHLORIDE 0.9 % WEIGHT BASED INFUSION: 1.0000 mL/kg/h | INTRAVENOUS | Status: DC

## 2017-01-16 MED ORDER — SODIUM CHLORIDE 0.9% FLUSH: 3.0000 mL | INTRAVENOUS | Status: DC | PRN

## 2017-01-16 MED ORDER — FENTANYL CITRATE (PF) 100 MCG/2ML IJ SOLN
INTRAMUSCULAR | Status: DC | PRN
  Administered 2017-01-16 (×2): 50 ug via INTRAVENOUS

## 2017-01-16 MED ORDER — MIDAZOLAM HCL 2 MG/2ML IJ SOLN
INTRAMUSCULAR | Status: DC | PRN
  Administered 2017-01-16: 1 mg via INTRAVENOUS

## 2017-01-16 MED ORDER — LIDOCAINE HCL (PF) 1 % IJ SOLN
INTRAMUSCULAR | Status: AC
  Filled 2017-01-16: qty 30

## 2017-01-16 MED ORDER — LIDOCAINE HCL (PF) 1 % IJ SOLN
INTRAMUSCULAR | Status: DC | PRN
  Administered 2017-01-16: 15 mL

## 2017-01-16 MED ORDER — FENTANYL CITRATE (PF) 100 MCG/2ML IJ SOLN
INTRAMUSCULAR | Status: AC
  Filled 2017-01-16: qty 2

## 2017-01-16 MED ORDER — SODIUM CHLORIDE 0.9 % IV SOLN: 250.0000 mL | INTRAVENOUS | Status: DC | PRN

## 2017-01-16 MED ORDER — MIDAZOLAM HCL 2 MG/2ML IJ SOLN
INTRAMUSCULAR | Status: AC
  Filled 2017-01-16: qty 2

## 2017-01-16 MED ORDER — LABETALOL HCL 5 MG/ML IV SOLN: 10.0000 mg | INTRAVENOUS | Status: DC | PRN

## 2017-01-16 MED ORDER — IODIXANOL 320 MG/ML IV SOLN
INTRAVENOUS | Status: DC | PRN
  Administered 2017-01-16: 170 mL via INTRAVENOUS

## 2017-01-16 MED ORDER — HEPARIN (PORCINE) IN NACL 2-0.9 UNIT/ML-% IJ SOLN
INTRAMUSCULAR | Status: AC
  Filled 2017-01-16: qty 500

## 2017-01-16 MED ORDER — HYDRALAZINE HCL 20 MG/ML IJ SOLN: 5.0000 mg | INTRAMUSCULAR | Status: DC | PRN

## 2017-01-16 MED ORDER — OXYCODONE HCL 5 MG PO TABS: 5.0000 mg | ORAL_TABLET | ORAL | Status: DC | PRN

## 2017-01-16 MED ORDER — SODIUM CHLORIDE 0.9 % IV SOLN
INTRAVENOUS | Status: DC
  Administered 2017-01-16: 08:00:00 via INTRAVENOUS

## 2017-01-16 MED ORDER — HEPARIN SODIUM (PORCINE) 1000 UNIT/ML IJ SOLN
INTRAMUSCULAR | Status: AC
  Filled 2017-01-16: qty 1

## 2017-01-16 MED ORDER — SODIUM CHLORIDE 0.9% FLUSH: 3.0000 mL | Freq: Two times a day (BID) | INTRAVENOUS | Status: DC

## 2017-01-16 MED ORDER — HEPARIN (PORCINE) IN NACL 2-0.9 UNIT/ML-% IJ SOLN
INTRAMUSCULAR | Status: AC | PRN
  Administered 2017-01-16: 1000 mL

## 2017-01-16 MED ORDER — HEPARIN SODIUM (PORCINE) 1000 UNIT/ML IJ SOLN
INTRAMUSCULAR | Status: DC | PRN
Start: 2017-01-16 — End: 2017-01-16
  Administered 2017-01-16: 8000 [IU] via INTRAVENOUS

## 2017-01-16 SURGICAL SUPPLY — 20 items
BALLN LUTONIX AV 8X40X75 (BALLOONS) ×3
BALLN MUSTANG 8.0X40 75 (BALLOONS) ×3
BALLOON LUTONIX AV 8X40X75 (BALLOONS) IMPLANT
BALLOON MUSTANG 8.0X40 75 (BALLOONS) IMPLANT
CATH ANGIO 5F BER2 65CM (CATHETERS) ×1 IMPLANT
CATH OMNI FLUSH 5F 65CM (CATHETERS) ×2 IMPLANT
CATH SOFT-VU 4F 65 STRAIGHT (CATHETERS) IMPLANT
CATH SOFT-VU STRAIGHT 4F 65CM (CATHETERS) ×3
DEVICE CONTINUOUS FLUSH (MISCELLANEOUS) ×1 IMPLANT
KIT ENCORE 26 ADVANTAGE (KITS) ×1 IMPLANT
KIT PV (KITS) ×3 IMPLANT
SHEATH BRITE TIP 6FR 35CM (SHEATH) ×1 IMPLANT
SHEATH BRITE TIP 7FR 35CM (SHEATH) ×1 IMPLANT
SHEATH PINNACLE 5F 10CM (SHEATH) ×1 IMPLANT
SYR MEDRAD MARK V 150ML (SYRINGE) ×3 IMPLANT
TRANSDUCER W/STOPCOCK (MISCELLANEOUS) ×3 IMPLANT
TRAY PV CATH (CUSTOM PROCEDURE TRAY) ×3 IMPLANT
WIRE BENTSON .035X145CM (WIRE) ×2 IMPLANT
WIRE J 3MM .035X145CM (WIRE) ×1 IMPLANT
WIRE ROSEN-J .035X180CM (WIRE) ×1 IMPLANT

## 2017-01-16 NOTE — Telephone Encounter (Signed)
-----   Message from Mena Goes, RN sent at 01/16/2017  2:55 PM EST ----- Regarding: 4 weeks postop    ----- Message ----- From: Conrad [place], MD Sent: 01/16/2017  11:24 AM To: [facility] [address]  [name] [id number] 1950-10-03  PROCEDURE: 1.  Right common femoral artery cannulation under ultrasound guidance 2.  Placement of catheter in aorta 3.  Aortogram 4.  Bilateral leg runoff via catheter 5.  Bland angioplasty of right common iliac artery (8 mm x 40 mm) 6.  Drug coated angioplasty of right common iliac artery (8 mm x 40 mm) 7.  Conscious sedation for 61 minutes  Follow-up: 4 weeks

## 2017-01-16 NOTE — H&P (Signed)
Brief History and Physical  History of Present Illness   [name] is a 67 y.o. male who presents with chief complaint: elevated velocities in R CIA stent.  The patient presents today for aortogram, bilateral pelvic angiogram, possible R CIA intervention.    Past Medical History:  Diagnosis Date  . Bronchitis   . Carotid artery occlusion   . Cervical spine disease    Neck surgery in 2007 with Dr. Joya Salm  . COPD (chronic obstructive pulmonary disease) ([place])   . Coronary atherosclerosis of native coronary artery    Nonobstrtuctive by catheterization 2007 and 2013  . DVT (deep venous thrombosis) ([place])   . Essential hypertension, benign   . GERD (gastroesophageal reflux disease)   . Hyperlipidemia   . PAD (peripheral artery disease) ([place])    s/p left fem-pop bypass 2007; PTA and stenting of right iliac artery 09/2011  . Rheumatoid arthritis(714.0)   . Type 2 diabetes mellitus ([place])     Past Surgical History:  Procedure Laterality Date  . ABDOMINAL AORTAGRAM N/A 10/03/2011   Procedure: ABDOMINAL Maxcine Ham;  Surgeon: Conrad Mellette, MD;  Location: [facility] CATH LAB;  Service: Cardiovascular;  Laterality: N/A;  . FEMORAL-POPLITEAL BYPASS GRAFT  11/06/2005  . ILIAC ARTERY STENT  10-03-11   Right CIA stenting  . PR VEIN BYPASS GRAFT,AORTO-FEM-POP  06-22-2010   Redo Left Fem-Pop  . Repair of left arm fracture    . 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 SURGERY SCROTAL / TESTICULAR      Social History   Socioeconomic History  . Marital status: Single    Spouse name: Not on file  . Number of children: Not on file  . Years of education: Not on file  . Highest education level: Not on file  Social Needs  . Financial resource strain: Not on file  . Food insecurity - worry: Not on file  . Food insecurity - inability: Not on file  . Transportation needs - medical: Not on file  . Transportation needs - non-medical: Not on file  Occupational History  . Not on file  Tobacco Use  .  Smoking status: Former Smoker    Packs/day: 0.10    Years: 40.00    Pack years: 4.00    Types: Cigarettes, Cigars    Start date: 12/22/1966    Last attempt to quit: 09/01/2011    Years since quitting: 5.3  . Smokeless tobacco: Never Used  Substance and Sexual Activity  . Alcohol use: Yes    Alcohol/week: 0.0 oz    Comment: occasional beer  . Drug use: No  . Sexual activity: Not Currently  Other Topics Concern  . Not on file  Social History Narrative   The patient lives in [place].     Family History  Problem Relation Age of Onset  . Heart attack Mother 23  . Deep vein thrombosis Mother   . Diabetes Mother   . Hyperlipidemia Mother   . Hypertension Mother   . Peripheral vascular disease Mother   . Stroke Father   . Diabetes Father   . Hyperlipidemia Father   . Hypertension Father   . Peripheral vascular disease Father   . Diabetes Sister   . Hyperlipidemia Sister   . Hypertension Sister   . Diabetes Brother   . Hyperlipidemia Brother   . Cancer Brother   . Deep vein thrombosis Brother   . Hypertension Brother   . Peripheral vascular disease Brother   .  Hyperlipidemia Son   . Cancer Son   . Hypertension Son     No current facility-administered medications for this encounter.     No Known Allergies  Review of Systems: As listed above, otherwise negative.   Physical Examination   Vitals:   01/16/17 0708  BP: (!) 172/103  Pulse: 82  Temp: 98 F (36.7 C)  TempSrc: Oral  SpO2: 100%  Weight: 175 lb (79.4 kg)  Height: 5\' 10"  (1.778 m)   Body mass index is 25.11 kg/m.  General Alert, O x 3, WD, NAD  Pulmonary Sym exp, good B air movt, CTA B  Cardiac RRR, Nl S1, S2, no Murmurs, No rubs, No S3,S4  Musculo- skeletal B feet viable  Neurologic Pain and light touch intact in extremities ,     Laboratory  See iStat   Medical Decision Making   [name] is a 66 y.o. male who presents with: prior L fem-pop BPG for intermittent claudication, s/p SIA+S  R CIA with likely in-stent restenosis vs proximal disease.   The patient is scheduled for: aortogram, bilateral pelvic angiogram, possible R intervention I discussed with the patient the nature of angiographic procedures, especially the limited patencies of any endovascular intervention.  The patient is aware of that the risks of an angiographic procedure include but are not limited to: bleeding, infection, access site complications, renal failure, embolization, rupture of vessel, dissection, possible need for emergent surgical intervention, possible need for surgical procedures to treat the patient's pathology, and stroke and death.    The patient is aware of the risks and agrees to proceed.   Adele Barthel, MD, FACS [facility] [place] Office: [phone] Pager: [phone]  01/16/2017, 7:11 AM

## 2017-01-16 NOTE — Telephone Encounter (Signed)
Sched appt 02/12/17 at 1:15. Lm on hm#.

## 2017-01-16 NOTE — Op Note (Addendum)
OPERATIVE NOTE   PROCEDURE: 1.  Right common femoral artery cannulation under ultrasound guidance 2.  Placement of catheter in aorta 3.  Aortogram 4.  Bilateral leg runoff via catheter 5.  Bland angioplasty of right common iliac artery (8 mm x 40 mm) 6.  Drug coated angioplasty of right common iliac artery (8 mm x 40 mm) 7.  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  POST-OPERATIVE DIAGNOSIS: same as above   SURGEON: Adele Barthel, MD  ANESTHESIA: conscious sedation  ESTIMATED BLOOD LOSS: 100 cc  CONTRAST: 170 cc  FINDING(S):  Aorta: patent  Superior mesenteric artery: not seen Celiac artery: not seen   Right Left  RA Patent Patent  CIA Patent, proximal stent sub-optimally deployed resulting 30% stenosis: resolved after serial angioplasty, some residual post-stenotic dilation Patent, heavily calcified  EIA patent patent  IIA patent patent  CFA patent patent  SFA patent Patent proximally, occludes in proximal 1/3, patent CFA to AK popliteal bypass  PFA patent patent  Pop patent patent  Trif patent patent  AT Patent, dominant runoff Occludes shortly after take-off  Pero Patent, distal overlaps with AT Patent, some disease near takeoff, dominant runoff  PT occluded occluded   SPECIMEN(S):  none  INDICATIONS:   [name] is a 67 y.o. male who presents with recurrent stenosis in right common iliac artery on surveillance studies.  The patient presents for: aortogram, bilateral leg runoff, and possible intervention.  I discussed with the patient the nature of angiographic procedures, especially the limited patencies of any endovascular intervention.  The patient is aware of that the risks of an angiographic procedure include but are not limited to: bleeding, infection, access site complications, renal failure, embolization, rupture of vessel, dissection, possible need for  emergent surgical intervention, possible need for surgical procedures to treat the patient's pathology, and stroke and death.  The patient is aware of the risks and agrees to proceed.  DESCRIPTION: After full informed consent was obtained from the patient, the patient was brought back to the angiography suite.  The patient was placed supine upon the angiography table and connected to cardiopulmonary monitoring equipment.  The patient was then given conscious sedation, the amounts of which are documented in the patient's chart.  A circulating radiologic technician maintained continuous monitoring of the patient's cardiopulmonary status.  Additionally, the control room radiologic technician provided backup monitoring throughout the procedure.  The patient was prepped and drape in the standard fashion for an angiographic procedure.  At this point, attention was turned to the right groin.  Under ultrasound guidance,  the subcutaneous tissue surrounding the right common femoral artery was anesthesized with 1% lidocaine with epinephrine.  On Sonosite, there was substantial anterior wall calcification.  The artery was then cannulated with a 18 gauge needle.  The Bentson wire was passed up into the aorta.  The needle was exchanged for a 5-Fr sheath, which was advanced over the wire into the common femoral artery.  The dilator was then removed.  The wire would not select the stent, so I had to load a BER-2 catheter over the wire.  Using this combination, I was able to select the right common iliac artery stent and cross into the aorta.  The catheter was exchanged for an Omniflush catheter which was loaded over the wire up to the level of L1.  The catheter was connected to the power injector circuit.  After de-airring and de-clotting the  circuit, a power injector aortogram was completed.    I pulled the sheath down to the distal aorta and did RAO and LAO pelvic injections.  On the RAO injection, sub-optimal expansion  of the right common iliac artery stent was noted, resulting in a 30% stenosis.  I felt this would need intervention to obtain the longest patency possible for this stent.  I decided to do an automated bilateral leg runoff prior to intervention to obtain baseline images.  The findings are listed above.    I replaced the Bentson wire into the catheter, straightening out the crook in the catheter.  Both were removed from the sheath together.  I exchanged the sheath for a 6-Fr long sheath which was advanced into the aorta.  The patient was given 8000 units of Heparin intravenously, which was a therapeutic bolus. I removed the dilator and then selected a 8 mm x 40 mm bland angioplasty balloon.  This was centered on the proximal segment of the right common iliac artery stent and inflated to 8 ATM for 2 minutes.  I deflated the balloon and removed it.    At this point, I elected to treat any in-stent restenosis with a drug coated balloon.  Unfortunately, the Lutonix requires a 7-Fr sheath, so I started to exchange the sheath over the Bentson wire.  This wire bent which resulted in difficulty placing the 7-Fr sheath.  I removed the 6-Fr sheath and then loaded a 4-Fr endhole catheter over the wire and advanced it into the right external iliac artery.  The wire was exchanged for a Rosen wire.  Using this wire, I able to place the 7-Fr sheath into the right external iliac artery.  I again used a BER-2 catheter and Bentson wire to select the right common iliac artery stent.  After getting into the aorta, I exchanged the wire for the [place] again.  I loaded the dilator over the wire and advanced the sheath into the aorta.  The dilator was exchanged for the Lutonix 8 mm x 40 mm drug coated balloon was inflated to treat the entire common iliac artery stent and residual unstented right common iliac artery.  This balloon was inflated at 8 ATM for 2 minutes.  The balloon was deflated and removed.  A retrograde injection was  completed which demonstrated sub-total resolution of the proximal stent stenosis.  No injury of the right common femoral artery was noted.   COMPLICATIONS: small right groin hematoma  CONDITION: stable   Adele Barthel, MD, [facility] [place] Office: [phone] Pager: [phone]  01/16/2017, 11:09 AM

## 2017-01-16 NOTE — Progress Notes (Addendum)
Site area: RFA Site Prior to Removal:  Level 2 Pressure Applied For: 20 min Manual:  yes  Patient Status During Pull: stable  Post Pull Site:  Level 2 Post Pull Instructions Given: yes  Post Pull Pulses Present: pslpable rt DP Dressing Applied:  tegaderm Bedrest begins @ 1230 till 1630 Comments: pt arrived to [facility] with level 2 Hematoma at RFA site--known by Dr Bridgett Larsson. Sheath removed at 1210 post ACT 175 at 1200.

## 2017-01-16 NOTE — Discharge Instructions (Signed)

## 2017-01-17 ENCOUNTER — Encounter (HOSPITAL_COMMUNITY): Payer: Self-pay | Admitting: Vascular Surgery

## 2017-01-28 ENCOUNTER — Ambulatory Visit (INDEPENDENT_AMBULATORY_CARE_PROVIDER_SITE_OTHER): Payer: Medicare Other | Admitting: Internal Medicine

## 2017-01-28 ENCOUNTER — Encounter: Payer: Self-pay | Admitting: Internal Medicine

## 2017-01-28 VITALS — BP 132/78 | HR 93 | Ht 70.0 in | Wt 175.8 lb

## 2017-01-28 DIAGNOSIS — J449 Chronic obstructive pulmonary disease, unspecified: Secondary | ICD-10-CM

## 2017-01-28 DIAGNOSIS — I10 Essential (primary) hypertension: Secondary | ICD-10-CM

## 2017-01-28 MED ORDER — DEXLANSOPRAZOLE 30 MG PO CPDR: DELAYED_RELEASE_CAPSULE | ORAL | 11 refills | Status: DC

## 2017-01-28 MED ORDER — BUDESONIDE-FORMOTEROL FUMARATE 160-4.5 MCG/ACT IN AERO: 2.0000 | INHALATION_SPRAY | Freq: Two times a day (BID) | RESPIRATORY_TRACT | 0 refills | Status: DC

## 2017-01-28 MED ORDER — BUDESONIDE-FORMOTEROL FUMARATE 160-4.5 MCG/ACT IN AERO: 2.0000 | INHALATION_SPRAY | Freq: Two times a day (BID) | RESPIRATORY_TRACT | 11 refills | Status: DC

## 2017-01-28 NOTE — Progress Notes (Signed)
Subjective:     Patient ID: [name], male   DOB: 11-May-1950,     MRN: [id number]  HPI   37 yobm quit smoking 2013 with dx of copd on prn inhalers didn't really use it much and "worse over the years" and placed on BREO but worse fall  2018 so placed on Trelegy and referred to pulmonary clinic 01/28/2017 by Dr   [name]    01/28/2017 1st [facility] office visit/ Telisha Zawadzki  ? Copd vs  Chief Complaint  Patient presents with  . Pulmonary Consult    Referred by Dr. [name] for eval of COPD. Pt states that he has been having SOB for the past several yrs, worse for the past 3-6 months. He gets SOB with walking short distances and up and down stairs. He has CP that comes and goes. He also has some cough with white sputum.  He is using proair once daily on average.   doe x years much worse x sev months, cp is not related to breathing or exertion localized L ant chest/ lasts just a few secs Cough is worse since sob worse/ esp hs and early in am Baseline = 15 min walk outside mostly flat but struggles if gets in a hurry or steps  Thinks proair helps his cp but note resolves in 2 secs whether uses proair or not  No other obvious day to day or daytime variability or assoc excess/ purulent sputum or mucus plugs or hemoptysis or cp or chest tightness, subjective wheeze or overt sinus or hb symptoms. No unusual exposure hx or h/o childhood pna/ asthma or knowledge of premature birth.    Also denies any obvious fluctuation of symptoms with weather or environmental changes or other aggravating or alleviating factors except as outlined above   Current Allergies, Complete Past Medical History, Past Surgical History, Family History, and Social History were reviewed in [employer] record.  ROS  The following are not active complaints unless bolded Hoarseness, sore throat, dysphagia, dental problems, itching, sneezing,  nasal congestion or discharge of excess mucus or purulent secretions,  ear ache,   fever, chills, sweats, unintended wt loss or wt gain, classically pleuritic or exertional cp,  Orthopnea and noct strangling sensations pnd or leg swelling, presyncope, palpitations, abdominal pain, anorexia, nausea, vomiting, diarrhea  or change in bowel habits or change in bladder habits, change in stools or change in urine, dysuria, hematuria,  rash, arthralgias, visual complaints, headache, numbness, weakness or ataxia or problems with walking or coordination,  change in mood/affect or memory.        Current Meds  Medication Sig  . atorvastatin (LIPITOR) 20 MG tablet Take 20 mg by mouth daily.  . celecoxib (CELEBREX) 200 MG capsule Take 1 capsule (200 mg total) by mouth daily. (HOLD X 5 DAYS WHILE TAKING THE IBUPROFEN)  . clopidogrel (PLAVIX) 75 MG tablet Take 1 tablet (75 mg total) by mouth daily.  Marland Kitchen DEXILANT 30 MG capsule Take 30 mg by mouth daily.   Marland Kitchen glipiZIDE (GLUCOTROL) 10 MG tablet Take 10 mg by mouth daily.   Marland Kitchen HYDROcodone-acetaminophen (NORCO) 7.5-325 MG per tablet Take 1 tablet by mouth 2 (two) times daily as needed for moderate pain.   Marland Kitchen JANUVIA 100 MG tablet Take 100 mg by mouth daily.   Marland Kitchen leflunomide (ARAVA) 20 MG tablet Take 20 mg by mouth daily.   Marland Kitchen lisinopril (PRINIVIL,ZESTRIL) 5 MG tablet Take 5 mg by mouth every evening.   . metFORMIN (GLUCOPHAGE)  500 MG tablet Take 500 mg by mouth 2 (two) times daily with a meal.   . nitroGLYCERIN (NITROSTAT) 0.4 MG SL tablet Place 1 tablet (0.4 mg total) under the tongue every 5 (five) minutes as needed for chest pain.  . ONE TOUCH ULTRA TEST test strip   . PROAIR HFA 108 (90 BASE) MCG/ACT inhaler Inhale 2 puffs into the lungs every 6 (six) hours as needed for wheezing or shortness of breath.   . VOLTAREN 1 % GEL Apply 1 application topically daily as needed (pain).   .   Fluticasone-Umeclidin-Vilant (TRELEGY ELLIPTA) 100-62.5-25 MCG/INH AEPB Inhale 1 puff into the lungs daily.           Review of Systems      Objective:   Physical Exam    amb hoarse bm nad   . Wt Readings from Last 3 Encounters:  01/28/17 175 lb 12.8 oz (79.7 kg)  01/16/17 175 lb (79.4 kg)  11/01/16 170 lb (77.1 kg)     Vital signs reviewed - Note on arrival 02 sats  98% on RA      HEENT: nl  turbinates bilaterally, and oropharynx. Nl external ear canals without cough reflex - upper and lower partial plates   NECK :  without JVD/Nodes/TM/ nl carotid upstrokes bilaterally   LUNGS: no acc muscle use,  Minimal barrel chest with mid exp rhonchi bilaterally much better with plm    CV:  RRR  no s3 or murmur or increase in P2, and no edema   ABD:  soft and nontender with nl inspiratory excursion in the supine position. No bruits or organomegaly appreciated, bowel sounds nl  MS:  Nl gait/ ext warm without deformities, calf tenderness, cyanosis or clubbing No obvious joint restrictions   SKIN: warm and dry without lesions    NEURO:  alert, approp, nl sensorium with  no motor or cerebellar deficits apparent.     I personally reviewed images and agree with radiology impression as follows:  CXR:   01/10/17 Mild copd changes only     Assessment:

## 2017-01-28 NOTE — Assessment & Plan Note (Addendum)
Quit smoking 2012 PFT's 01/04/15  FEV1 2.15 (71 % ) ratio 58  p 14 % improvement from saba p ? prior to study with DLCO  70 % corrects to 94  % for alv volume  With only mild curvature f/v loop  - Spirometry 01/28/2017  FEV1 1.59 (53%)  Ratio 59 off all rx  - 01/28/2017  After extensive coaching inhaler device  effectiveness =    So symb 160 2bid and d/c acei and double the dexilant    DDX of  difficult airways management almost all start with A and  include Adherence, Ace Inhibitors, Acid Reflux, Active Sinus Disease, Alpha 1 Antitripsin deficiency, Anxiety masquerading as Airways dz,  ABPA,  Allergy(esp in young), Aspiration (esp in elderly), Adverse effects of meds,  Active smokers, A bunch of PE's (a small clot burden can't cause this syndrome unless there is already severe underlying pulm or vascular dz with poor reserve) plus two Bs  = Bronchiectasis and Beta blocker use..and one C= CHF    Adherence is always the initial "prime suspect" and is a multilayered concern that requires a "trust but verify" approach in every patient - starting with knowing how to use medications, especially inhalers, correctly, keeping up with refills and understanding the fundamental difference between maintenance and prns vs those medications only taken for a very short course and then stopped and not refilled.  -see hfa teaching - return with all meds in hand using a trust but verify approach to confirm accurate Medication  Reconciliation The principal here is that until we are certain that the  patients are doing what we've asked, it makes no sense to ask them to do more.   ACEi adverse effects at the  top of the usual list of suspects and the only way to rule it out is a trial off > see a/p    ? Acid (or non-acid) GERD > always difficult to exclude as up to 75% of pts in some series report no assoc GI/ Heartburn symptoms> rec increase dexilant to Take 30- 60 min before your first and last meals of the day   ?  Adverse effects of dpi > try hfa = symbicort 160 2bid   ? Anxiety > usually at the bottom of this list of usual suspects but should be much higher on this pt's based on H and P    ? Allergy/asthma component > address with high dose ics only for now and regroup at next ov    Total time devoted to counseling  > 50 % of initial 60 min office visit:  review case with pt/ discussion of options/alternatives/ personally creating written customized instructions  in presence of pt  then going over those specific  Instructions directly with the pt including how to use all of the meds but in particular covering each new medication in detail and the difference between the maintenance= "automatic" meds and the prns using an action plan format for the latter (If this problem/symptom => do that organization reading Left to right).  Please see AVS from this visit for a full list of these instructions which I personally wrote for this pt and  are unique to this visit.

## 2017-01-28 NOTE — Assessment & Plan Note (Signed)
In the best review of chronic cough to date ( NEJM 2016 375 [id number]) ,  ACEi are now felt to cause cough in up to  20% of pts which is a 4 fold increase from previous reports and does not include the variety of non-specific complaints we see in pulmonary clinic in pts on ACEi but previously attributed to another dx like  Copd/asthma and  include PNDS, throat and chest congestion, "bronchitis", unexplained dyspnea and noct "strangling" sensations, and hoarseness, but also  atypical /refractory GERD symptoms like dysphagia and "bad heartburn"   The only way I know  to prove this is not an "ACEi Case" is a trial off ACEi x a minimum of 6 weeks then regroup.   Try losartan 50 mg daily

## 2017-01-28 NOTE — Patient Instructions (Signed)
Continue the dexilant but for now take it Take 30- 60 min before your first and last meals of the day until cough and breathing better then just Take 30-60 min before first meal of the day   Stop Trelegy and lisinopril  Losartan 50 mg only daily takes the place of lisinopril   Plan A = Automatic = symbicort 160 Take 2 puffs first thing in am and then another 2 puffs about 12 hours later.   Work on inhaler technique:  relax and gently blow all the way out then take a nice smooth deep breath back in, triggering the inhaler at same time you start breathing in.  Hold for up to 5 seconds if you can. Blow out thru nose. Rinse and gargle with water when done      Plan B = Backup Only use your albuterol as a rescue medication to be used if you can't catch your breath by resting or doing a relaxed purse lip breathing pattern.  - The less you use it, the better it will work when you need it. - Ok to use the inhaler up to 2 puffs  every 4 hours if you must but call for appointment if use goes up over your usual need - Don't leave home without it !!  (think of it like the spare tire for your car)    Please schedule a follow up office visit in 4 weeks, sooner if needed  with all medications /inhalers/ solutions in hand so we can verify exactly what you are taking. This includes all medications from all doctors and over the counters

## 2017-01-30 ENCOUNTER — Telehealth: Payer: Self-pay | Admitting: Internal Medicine

## 2017-01-30 MED ORDER — LOSARTAN POTASSIUM 50 MG PO TABS: 50.0000 mg | ORAL_TABLET | Freq: Every day | ORAL | 11 refills | Status: DC

## 2017-01-30 NOTE — Telephone Encounter (Signed)
Patient Instructions by Tanda Rockers, MD at 01/28/2017 1:30 PM   Author: Tanda Rockers, MD Author Type: Physician Filed: 01/28/2017 2:09 PM  Note Status: Signed Cosign: Cosign Not Required Encounter Date: 01/28/2017  Editor: Tanda Rockers, MD (Physician)    Continue the dexilant but for now take it Take 30- 60 min before your first and last meals of the day until cough and breathing better then just Take 30-60 min before first meal of the day   Stop Trelegy and lisinopril  Losartan 50 mg only daily takes the place of lisinopril   Plan A = Automatic = symbicort 160 Take 2 puffs first thing in am and then another 2 puffs about 12 hours later.   Work on inhaler technique:  relax and gently blow all the way out then take a nice smooth deep breath back in, triggering the inhaler at same time you start breathing in.  Hold for up to 5 seconds if you can. Blow out thru nose. Rinse and gargle with water when done      Plan B = Backup Only use your albuterol as a rescue medication to be used if you can't catch your breath by resting or doing a relaxed purse lip breathing pattern.  - The less you use it, the better it will work when you need it. - Ok to use the inhaler up to 2 puffs  every 4 hours if you must but call for appointment if use goes up over your usual need - Don't leave home without it !!  (think of it like the spare tire for your car)    Please schedule a follow up office visit in 4 weeks, sooner if needed  with all medications /inhalers/ solutions in hand so we can verify exactly what you are taking. This includes all medications from all doctors and over the counters       Spoke with pt, he needs a Rx for the bp med MW gave him instead of the Lisinopril. Sent in Losartan, to pharmacy. Nothing further is needed.

## 2017-02-05 ENCOUNTER — Telehealth: Payer: Self-pay | Admitting: Internal Medicine

## 2017-02-05 MED ORDER — BUDESONIDE-FORMOTEROL FUMARATE 160-4.5 MCG/ACT IN AERO: 2.0000 | INHALATION_SPRAY | Freq: Two times a day (BID) | RESPIRATORY_TRACT | 11 refills | Status: DC

## 2017-02-05 NOTE — Telephone Encounter (Signed)
Spoke with pt. He is needing a prescription sent in for Symbicort. Rx has been sent in. Nothing further was needed.

## 2017-02-06 NOTE — Progress Notes (Signed)
Postoperative Visit (Angio)   History of Present Illness   [name] is a 67 y.o. male who presents cc:  Right groin and RLQ pain.  Prior procedure include: 1.  DCB OTA R CIA (01/16/17) 2. R SIA+S R CIA (10/03/11) 3. Redo L CFA to AK pop BPG (06/23/10) by Dr. Kellie [name] 4. L CFA to AK pop BPG (11/06/05) by Dr. Kellie [name].   Patient reported went to ED for CT which demonstrated a possible cecal mass.   The pt is scheduled for a colonoscopy.  The patient's current symptoms are: intermittent pain at cannulation side and RLQ pain that more significant.  Pt denies any hematochezia or weight loss.  Past Medical History, Past Surgical History, Social History, Family History, Medications, Allergies, and Review of Systems are unchanged from previous evaluation on 01/16/17.  Current Outpatient Medications  Medication Sig Dispense Refill  . atorvastatin (LIPITOR) 20 MG tablet Take 20 mg by mouth daily.    . budesonide-formoterol (SYMBICORT) 160-4.5 MCG/ACT inhaler Inhale 2 puffs into the lungs 2 (two) times daily. 1 Inhaler 11  . celecoxib (CELEBREX) 200 MG capsule Take 1 capsule (200 mg total) by mouth daily. (HOLD X 5 DAYS WHILE TAKING THE IBUPROFEN)    . clopidogrel (PLAVIX) 75 MG tablet Take 1 tablet (75 mg total) by mouth daily. 30 tablet 11  . Dexlansoprazole (DEXILANT) 30 MG capsule Take 30- 60 min before your first and last meals of the day 60 capsule 11  . glipiZIDE (GLUCOTROL) 10 MG tablet Take 10 mg by mouth daily.     Marland Kitchen HYDROcodone-acetaminophen (NORCO) 7.5-325 MG per tablet Take 1 tablet by mouth 2 (two) times daily as needed for moderate pain.     Marland Kitchen JANUVIA 100 MG tablet Take 100 mg by mouth daily.     Marland Kitchen leflunomide (ARAVA) 20 MG tablet Take 20 mg by mouth daily.     Marland Kitchen losartan (COZAAR) 50 MG tablet Take 1 tablet (50 mg total) by mouth daily. 30 tablet 11  . metFORMIN (GLUCOPHAGE) 500 MG tablet Take 500 mg by mouth 2 (two) times daily with a meal.     . nitroGLYCERIN (NITROSTAT)  0.4 MG SL tablet Place 1 tablet (0.4 mg total) under the tongue every 5 (five) minutes as needed for chest pain. 90 tablet 3  . ONE TOUCH ULTRA TEST test strip     . PROAIR HFA 108 (90 BASE) MCG/ACT inhaler Inhale 2 puffs into the lungs every 6 (six) hours as needed for wheezing or shortness of breath.     . VOLTAREN 1 % GEL Apply 1 application topically daily as needed (pain).      No current facility-administered medications for this visit.     ROS: RLQ abd pain, R groin pain   For VQI Use Only   PRE-ADM LIVING: Home  AMB STATUS: Ambulatory   Physical Examination   Vitals:   02/12/17 1314 02/12/17 1319  BP: (!) 155/92 (!) 148/93  Pulse: 86 87  Resp: 18   Temp: 98 F (36.7 C)   TempSrc: Oral   SpO2: 97%   Weight: 173 lb (78.5 kg)   Height: 5\' 10"  (1.778 m)    Body mass index is 24.82 kg/m.  General Alert, O x 3, WD, NAD  Pulmonary Sym exp, good B air movt, CTA B  Cardiac RRR, Nl S1, S2, no Murmurs, No rubs, No S3,S4  Vascular Vessel Right Left  Radial Palpable Palpable  Brachial Palpable Palpable  Carotid Palpable,  No Bruit Palpable, No Bruit  Aorta Not palpable N/A  Femoral Palpable Palpable  Popliteal Not palpable Not palpable  PT Palpable Palpable  DP Palpable Palpable    Gastrointestinal soft, non-distended, non-tender to palpation, No guarding or rebound, no HSM, no masses, no CVAT B, No palpable prominent aortic pulse,  possible small R inguinal hernia  Musculoskeletal M/S 5/5 throughout  , Extremities without ischemic changes  , No edema present, R groin: no hematoma or PSA under Sonosite, small inguinal hernia evident, small LN  Neurologic  Pain and light touch intact in extremities , Motor exam as listed above    Medical Decision Making   [name] is a 67 y.o. male who presents s/p recent DCB PTA R CIA stenting, R CIA stent, Redo L fem-AK pop BPG, L fem-AK pop BPG  Unclear etiology of this patient's RLQ pain, work-up in progress. Suspect R  groin pain is residual from the R femoral cannulation vs small RIH. Based on his angiographic findings, this patient needs: q3 BLE ABI and aortoiliac duplex and LLE arterial duplex. I discussed in depth with the patient the nature of atherosclerosis, and emphasized the importance of maximal medical management including strict control of blood pressure, blood glucose, and lipid levels, obtaining regular exercise, and cessation of smoking.  The patient is aware that without maximal medical management the underlying atherosclerotic disease process will progress, limiting the benefit of any interventions. The patient is currently on a statin: Lipitor.  The patient is currently on an anti-platelet: Plavix.  Thank you for allowing [place] to participate in this patient's care.   Adele Barthel, MD, FACS [facility] Office: [phone] Pager: [phone]

## 2017-02-07 ENCOUNTER — Ambulatory Visit: Payer: Medicare Other | Admitting: Vascular Surgery

## 2017-02-07 ENCOUNTER — Encounter (HOSPITAL_COMMUNITY): Payer: Medicare Other

## 2017-02-10 ENCOUNTER — Ambulatory Visit (INDEPENDENT_AMBULATORY_CARE_PROVIDER_SITE_OTHER): Payer: Medicare Other | Admitting: Nurse Practitioner

## 2017-02-10 ENCOUNTER — Encounter: Payer: Self-pay | Admitting: Nurse Practitioner

## 2017-02-10 ENCOUNTER — Other Ambulatory Visit: Payer: Self-pay

## 2017-02-10 DIAGNOSIS — R935 Abnormal findings on diagnostic imaging of other abdominal regions, including retroperitoneum: Secondary | ICD-10-CM | POA: Diagnosis not present

## 2017-02-10 DIAGNOSIS — K921 Melena: Secondary | ICD-10-CM | POA: Diagnosis not present

## 2017-02-10 MED ORDER — PEG 3350-KCL-NA BICARB-NACL 420 G PO SOLR: 4000.0000 mL | ORAL | 0 refills | Status: DC

## 2017-02-10 NOTE — H&P (View-Only) (Signed)
Primary Care Physician:  [name], MD Primary Gastroenterologist:  Dr. Gala Romney  Chief Complaint  Patient presents with  . ascending colon mass    HPI:   [name] is a 67 y.o. male who presents on referral from primary care for a sending colon mass.  The patient last saw primary care on 02/06/2017.  At that time he noted right lower quadrant pain which was sudden in onset, intermittent, increasing frequency and described as moderate sharp pain.  CT of the abdomen and pelvis was performed.  A baseline CT was included with provider notes.  Baseline CT of the abdomen on 12/13/2015 found diverticulosis without diverticulitis, normal-appearing appendix, no other focal abnormality.  Repeat CT of the abdomen completed 02/06/2017 found questionable 3 cm solid and cystic mass of the ascending colon which could be artifactual but recommended further evaluation with colonoscopy.  Today he states he has a lot of questions. His last colonoscopy was about 5-6 years ago at [facility], cannot remember the provider. His RLQ abdominal pain has improved. Denies N/V, hematochezia. Later on he states if he has stools more than 2 times in a day he will see some hematochezia/bleeding. Has had some darkened stools not on iron but has used Pepto in the past. Denies fever, chills, acute changes in bowel habits. Subjectively lost 20 lbs over the past year with increased walking to try and improve his LE vascular status. Has COPD. Breathing is better on new inhalers. Denies chest pain, dizziness, lightheadedness, syncope, near syncope. Denies any other upper or lower GI symptoms.  Past Medical History:  Diagnosis Date  . Bronchitis   . Carotid artery occlusion   . Cervical spine disease    Neck surgery in 2007 with Dr. Joya Salm  . COPD (chronic obstructive pulmonary disease) ([place])   . Coronary atherosclerosis of native coronary artery    Nonobstrtuctive by catheterization 2007 and 2013  . DVT (deep venous  thrombosis) ([place])   . Essential hypertension, benign   . GERD (gastroesophageal reflux disease)   . Hyperlipidemia   . PAD (peripheral artery disease) ([place])    s/p left fem-pop bypass 2007; PTA and stenting of right iliac artery 09/2011  . Rheumatoid arthritis(714.0)   . Type 2 diabetes mellitus ([place])     Past Surgical History:  Procedure Laterality Date  . ABDOMINAL AORTAGRAM N/A 10/03/2011   Procedure: ABDOMINAL Maxcine Ham;  Surgeon: Conrad O'Neill, MD;  Location[facility] CATH LAB;  Service: Cardiovascular;  Laterality: N/A;  . ABDOMINAL AORTOGRAM W/LOWER EXTREMITY N/A 01/16/2017   Procedure: ABDOMINAL AORTOGRAM W/LOWER EXTREMITY;  Surgeon: Conrad Clear Lake, MD;  Location: [facility] CV LAB;  Service: Cardiovascular;  Laterality: N/A;  . FEMORAL-POPLITEAL BYPASS GRAFT  11/06/2005  . ILIAC ARTERY STENT  10-03-11   Right CIA stenting  . PERIPHERAL VASCULAR BALLOON ANGIOPLASTY Right 01/16/2017   Procedure: PERIPHERAL VASCULAR BALLOON ANGIOPLASTY;  Surgeon: Conrad Boothville, MD;  Location: [facility] LAB;  Service: Cardiovascular;  Laterality: Right;  common iliac   . PR VEIN BYPASS GRAFT,AORTO-FEM-POP  06-22-2010   Redo Left Fem-Pop  . Repair of left arm fracture    . 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 SURGERY SCROTAL / TESTICULAR      Current Outpatient Medications  Medication Sig Dispense Refill  . atorvastatin (LIPITOR) 20 MG tablet Take 20 mg by mouth daily.    . budesonide-formoterol (SYMBICORT) 160-4.5 MCG/ACT inhaler Inhale 2 puffs into the lungs 2 (two) times daily. 1 Inhaler  11  . celecoxib (CELEBREX) 200 MG capsule Take 1 capsule (200 mg total) by mouth daily. (HOLD X 5 DAYS WHILE TAKING THE IBUPROFEN)    . clopidogrel (PLAVIX) 75 MG tablet Take 1 tablet (75 mg total) by mouth daily. 30 tablet 11  . Dexlansoprazole (DEXILANT) 30 MG capsule Take 30- 60 min before your first and last meals of the day (Patient taking differently: Take 30 mg by mouth daily. ) 60 capsule 11  . glipiZIDE  (GLUCOTROL) 10 MG tablet Take 10 mg by mouth daily.     Marland Kitchen HYDROcodone-acetaminophen (NORCO) 7.5-325 MG per tablet Take 1 tablet by mouth 2 (two) times daily as needed for moderate pain.     Marland Kitchen JANUVIA 100 MG tablet Take 100 mg by mouth daily.     Marland Kitchen losartan (COZAAR) 50 MG tablet Take 1 tablet (50 mg total) by mouth daily. 30 tablet 11  . metFORMIN (GLUCOPHAGE) 500 MG tablet Take 500 mg by mouth 2 (two) times daily with a meal.     . nitroGLYCERIN (NITROSTAT) 0.4 MG SL tablet Place 1 tablet (0.4 mg total) under the tongue every 5 (five) minutes as needed for chest pain. 90 tablet 3  . ONE TOUCH ULTRA TEST test strip     . PROAIR HFA 108 (90 BASE) MCG/ACT inhaler Inhale 2 puffs into the lungs every 6 (six) hours as needed for wheezing or shortness of breath.     . VOLTAREN 1 % GEL Apply 1 application topically daily as needed (pain).      No current facility-administered medications for this visit.     Allergies as of 02/10/2017  . (No Known Allergies)    Family History  Problem Relation Age of Onset  . Heart attack Mother 65  . Deep vein thrombosis Mother   . Diabetes Mother   . Hyperlipidemia Mother   . Hypertension Mother   . Peripheral vascular disease Mother   . Stroke Father   . Diabetes Father   . Hyperlipidemia Father   . Hypertension Father   . Peripheral vascular disease Father   . Diabetes Sister   . Hyperlipidemia Sister   . Hypertension Sister   . Diabetes Brother   . Hyperlipidemia Brother   . Cancer Brother   . Deep vein thrombosis Brother   . Hypertension Brother   . Peripheral vascular disease Brother   . Hyperlipidemia Son   . Cancer Son   . Hypertension Son   . Colon cancer Neg Hx     Social History   Socioeconomic History  . Marital status: Single    Spouse name: Not on file  . Number of children: Not on file  . Years of education: Not on file  . Highest education level: Not on file  Social Needs  . Financial resource strain: Not on file  . Food  insecurity - worry: Not on file  . Food insecurity - inability: Not on file  . Transportation needs - medical: Not on file  . Transportation needs - non-medical: Not on file  Occupational History  . Not on file  Tobacco Use  . Smoking status: Former Smoker    Packs/day: 1.00    Years: 45.00    Pack years: 45.00    Types: Cigarettes, Cigars    Last attempt to quit: 09/01/2011    Years since quitting: 5.4  . Smokeless tobacco: Never Used  Substance and Sexual Activity  . Alcohol use: Yes    Alcohol/week: 0.0 oz  Comment: occasional beer  . Drug use: No  . Sexual activity: Not Currently  Other Topics Concern  . Not on file  Social History Narrative   The patient lives in [place].     Review of Systems: General: Negative for anorexia, weight loss, fever, chills, fatigue, weakness. ENT: Negative for hoarseness, difficulty swallowing , nasal congestion. CV: Negative for chest pain, angina, palpitations, dyspnea on exertion, peripheral edema.  Respiratory: Negative for dyspnea at rest, dyspnea on exertion, cough, sputum, wheezing.  GI: See history of present illness. MS: Negative for joint pain, low back pain.  Derm: Negative for rash or itching.  Endo: Negative for unusual weight change.  Heme: Negative for bruising or bleeding. Allergy: Negative for rash or hives.    Physical Exam: BP (!) 159/87   Pulse (!) 108   Temp 97.9 F (36.6 C) (Oral)   Ht 5\' 10"  (1.778 m)   Wt 177 lb 6.4 oz (80.5 kg)   BMI 25.45 kg/m  General:   Alert and oriented. Pleasant and cooperative. Well-nourished and well-developed.  Eyes:  Without icterus, sclera clear and conjunctiva pink.  Ears:  Normal auditory acuity. Cardiovascular:  S1, S2 present without murmurs appreciated. Extremities without clubbing or edema. Respiratory:  Clear to auscultation bilaterally. No wheezes, rales, or rhonchi. No distress.  Gastrointestinal:  +BS, soft, and non-distended. Mild to moderate point tenderness noted  RLQ palpation. No HSM noted. No guarding or rebound. No masses appreciated.  Rectal:  Deferred  Musculoskalatal:  Symmetrical without gross deformities. Neurologic:  Alert and oriented x4;  grossly normal neurologically. Psych:  Alert and cooperative. Normal mood and affect. Heme/Lymph/Immune: No excessive bruising noted.    02/10/2017 11:15 AM   Disclaimer: This note was dictated with voice recognition software. Similar sounding words can inadvertently be transcribed and may not be corrected upon review.

## 2017-02-10 NOTE — Patient Instructions (Signed)
EG verbally advised for pt to take 1/2 dose of diabetic medication evening before tcs and none the morning of tcs. Noted on pt's instructions and advised verbally.

## 2017-02-10 NOTE — Assessment & Plan Note (Signed)
The patient initially did not hematochezia but then stated if he has more than 2 stools in 1 day he will have some hematochezia/bright red rectal bleeding.  Also notes dark stools, but occasionally takes Pepto-Bismol.  Given his abnormal CT findings, as per below, we will proceed with colonoscopy as per below.  Return for follow-up in 3 months.

## 2017-02-10 NOTE — Progress Notes (Signed)
Primary Care Physician:  [name], MD Primary Gastroenterologist:  Dr. Gala Romney  Chief Complaint  Patient presents with  . ascending colon mass    HPI:   [name] is a 66 y.o. male who presents on referral from primary care for a sending colon mass.  The patient last saw primary care on 02/06/2017.  At that time he noted right lower quadrant pain which was sudden in onset, intermittent, increasing frequency and described as moderate sharp pain.  CT of the abdomen and pelvis was performed.  A baseline CT was included with provider notes.  Baseline CT of the abdomen on 12/13/2015 found diverticulosis without diverticulitis, normal-appearing appendix, no other focal abnormality.  Repeat CT of the abdomen completed 02/06/2017 found questionable 3 cm solid and cystic mass of the ascending colon which could be artifactual but recommended further evaluation with colonoscopy.  Today he states he has a lot of questions. His last colonoscopy was about 5-6 years ago at [facility], cannot remember the provider. His RLQ abdominal pain has improved. Denies N/V, hematochezia. Later on he states if he has stools more than 2 times in a day he will see some hematochezia/bleeding. Has had some darkened stools not on iron but has used Pepto in the past. Denies fever, chills, acute changes in bowel habits. Subjectively lost 20 lbs over the past year with increased walking to try and improve his LE vascular status. Has COPD. Breathing is better on new inhalers. Denies chest pain, dizziness, lightheadedness, syncope, near syncope. Denies any other upper or lower GI symptoms.  Past Medical History:  Diagnosis Date  . Bronchitis   . Carotid artery occlusion   . Cervical spine disease    Neck surgery in 2007 with Dr. Joya Salm  . COPD (chronic obstructive pulmonary disease) ([place])   . Coronary atherosclerosis of native coronary artery    Nonobstrtuctive by catheterization 2007 and 2013  . DVT (deep venous  thrombosis) ([place])   . Essential hypertension, benign   . GERD (gastroesophageal reflux disease)   . Hyperlipidemia   . PAD (peripheral artery disease) ([place])    s/p left fem-pop bypass 2007; PTA and stenting of right iliac artery 09/2011  . Rheumatoid arthritis(714.0)   . Type 2 diabetes mellitus ([place])     Past Surgical History:  Procedure Laterality Date  . ABDOMINAL AORTAGRAM N/A 10/03/2011   Procedure: ABDOMINAL Maxcine Ham;  Surgeon: Conrad Long Lake, MD;  Location: [facility] CATH LAB;  Service: Cardiovascular;  Laterality: N/A;  . ABDOMINAL AORTOGRAM W/LOWER EXTREMITY N/A 01/16/2017   Procedure: ABDOMINAL AORTOGRAM W/LOWER EXTREMITY;  Surgeon: Conrad Hebron, MD;  Location: [facility] LAB;  Service: Cardiovascular;  Laterality: N/A;  . FEMORAL-POPLITEAL BYPASS GRAFT  11/06/2005  . ILIAC ARTERY STENT  10-03-11   Right CIA stenting  . PERIPHERAL VASCULAR BALLOON ANGIOPLASTY Right 01/16/2017   Procedure: PERIPHERAL VASCULAR BALLOON ANGIOPLASTY;  Surgeon: Conrad Heber, MD;  Location: [facility] LAB;  Service: Cardiovascular;  Laterality: Right;  common iliac   . PR VEIN BYPASS GRAFT,AORTO-FEM-POP  06-22-2010   Redo Left Fem-Pop  . Repair of left arm fracture    . 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 SURGERY SCROTAL / TESTICULAR      Current Outpatient Medications  Medication Sig Dispense Refill  . atorvastatin (LIPITOR) 20 MG tablet Take 20 mg by mouth daily.    . budesonide-formoterol (SYMBICORT) 160-4.5 MCG/ACT inhaler Inhale 2 puffs into the lungs 2 (two) times daily. 1 Inhaler  11  . celecoxib (CELEBREX) 200 MG capsule Take 1 capsule (200 mg total) by mouth daily. (HOLD X 5 DAYS WHILE TAKING THE IBUPROFEN)    . clopidogrel (PLAVIX) 75 MG tablet Take 1 tablet (75 mg total) by mouth daily. 30 tablet 11  . Dexlansoprazole (DEXILANT) 30 MG capsule Take 30- 60 min before your first and last meals of the day (Patient taking differently: Take 30 mg by mouth daily. ) 60 capsule 11  . glipiZIDE  (GLUCOTROL) 10 MG tablet Take 10 mg by mouth daily.     Marland Kitchen HYDROcodone-acetaminophen (NORCO) 7.5-325 MG per tablet Take 1 tablet by mouth 2 (two) times daily as needed for moderate pain.     Marland Kitchen JANUVIA 100 MG tablet Take 100 mg by mouth daily.     Marland Kitchen losartan (COZAAR) 50 MG tablet Take 1 tablet (50 mg total) by mouth daily. 30 tablet 11  . metFORMIN (GLUCOPHAGE) 500 MG tablet Take 500 mg by mouth 2 (two) times daily with a meal.     . nitroGLYCERIN (NITROSTAT) 0.4 MG SL tablet Place 1 tablet (0.4 mg total) under the tongue every 5 (five) minutes as needed for chest pain. 90 tablet 3  . ONE TOUCH ULTRA TEST test strip     . PROAIR HFA 108 (90 BASE) MCG/ACT inhaler Inhale 2 puffs into the lungs every 6 (six) hours as needed for wheezing or shortness of breath.     . VOLTAREN 1 % GEL Apply 1 application topically daily as needed (pain).      No current facility-administered medications for this visit.     Allergies as of 02/10/2017  . (No Known Allergies)    Family History  Problem Relation Age of Onset  . Heart attack Mother 17  . Deep vein thrombosis Mother   . Diabetes Mother   . Hyperlipidemia Mother   . Hypertension Mother   . Peripheral vascular disease Mother   . Stroke Father   . Diabetes Father   . Hyperlipidemia Father   . Hypertension Father   . Peripheral vascular disease Father   . Diabetes Sister   . Hyperlipidemia Sister   . Hypertension Sister   . Diabetes Brother   . Hyperlipidemia Brother   . Cancer Brother   . Deep vein thrombosis Brother   . Hypertension Brother   . Peripheral vascular disease Brother   . Hyperlipidemia Son   . Cancer Son   . Hypertension Son   . Colon cancer Neg Hx     Social History   Socioeconomic History  . Marital status: Single    Spouse name: Not on file  . Number of children: Not on file  . Years of education: Not on file  . Highest education level: Not on file  Social Needs  . Financial resource strain: Not on file  . Food  insecurity - worry: Not on file  . Food insecurity - inability: Not on file  . Transportation needs - medical: Not on file  . Transportation needs - non-medical: Not on file  Occupational History  . Not on file  Tobacco Use  . Smoking status: Former Smoker    Packs/day: 1.00    Years: 45.00    Pack years: 45.00    Types: Cigarettes, Cigars    Last attempt to quit: 09/01/2011    Years since quitting: 5.4  . Smokeless tobacco: Never Used  Substance and Sexual Activity  . Alcohol use: Yes    Alcohol/week: 0.0 oz  Comment: occasional beer  . Drug use: No  . Sexual activity: Not Currently  Other Topics Concern  . Not on file  Social History Narrative   The patient lives in [place].     Review of Systems: General: Negative for anorexia, weight loss, fever, chills, fatigue, weakness. ENT: Negative for hoarseness, difficulty swallowing , nasal congestion. CV: Negative for chest pain, angina, palpitations, dyspnea on exertion, peripheral edema.  Respiratory: Negative for dyspnea at rest, dyspnea on exertion, cough, sputum, wheezing.  GI: See history of present illness. MS: Negative for joint pain, low back pain.  Derm: Negative for rash or itching.  Endo: Negative for unusual weight change.  Heme: Negative for bruising or bleeding. Allergy: Negative for rash or hives.    Physical Exam: BP (!) 159/87   Pulse (!) 108   Temp 97.9 F (36.6 C) (Oral)   Ht 5\' 10"  (1.778 m)   Wt 177 lb 6.4 oz (80.5 kg)   BMI 25.45 kg/m  General:   Alert and oriented. Pleasant and cooperative. Well-nourished and well-developed.  Eyes:  Without icterus, sclera clear and conjunctiva pink.  Ears:  Normal auditory acuity. Cardiovascular:  S1, S2 present without murmurs appreciated. Extremities without clubbing or edema. Respiratory:  Clear to auscultation bilaterally. No wheezes, rales, or rhonchi. No distress.  Gastrointestinal:  +BS, soft, and non-distended. Mild to moderate point tenderness noted  RLQ palpation. No HSM noted. No guarding or rebound. No masses appreciated.  Rectal:  Deferred  Musculoskalatal:  Symmetrical without gross deformities. Neurologic:  Alert and oriented x4;  grossly normal neurologically. Psych:  Alert and cooperative. Normal mood and affect. Heme/Lymph/Immune: No excessive bruising noted.    02/10/2017 11:15 AM   Disclaimer: This note was dictated with voice recognition software. Similar sounding words can inadvertently be transcribed and may not be corrected upon review.

## 2017-02-10 NOTE — Assessment & Plan Note (Signed)
The patient was referred by primary care for abnormal CT of the abdomen.  Baseline CT completed in 2017 essentially normal except for diverticulosis.  Repeat CT due to right lower quadrant pain on 02/06/2017 found a questionable 3 cm solid and cystic mass of the ascending colon with recommended colonoscopy for further evaluation.  The patient states his last colonoscopy was 5-6 years ago at [facility].  We have requested these records to be scanned in our system.  He is having some intermittent hematochezia.  Based on his CT imaging findings and symptoms we will refer him for an ASAP colonoscopy.  Follow-up in 3 months.  Proceed with TCS on propofol/MAC with Dr. Gala Romney in near future: the risks, benefits, and alternatives have been discussed with the patient in detail. The patient states understanding and desires to proceed.  The patient is currently on Plavix, hydrocodone.  Occasional alcohol.  Given polypharmacy and intermittent alcohol use we will plan for the procedure on propofol/MAC.  We will have him hold half his diabetes medicines tonight before his procedure and taken on the morning of.

## 2017-02-10 NOTE — Patient Instructions (Signed)
1. We will schedule your colonoscopy for you. 2. We will request her previous colonoscopy from [facility]. 3. Return for follow-up in 3 months. 4. Call us if you have any questions or concerns.

## 2017-02-11 NOTE — Progress Notes (Signed)
CC'ED TO PCP 

## 2017-02-11 NOTE — Patient Instructions (Signed)
[name]  02/11/2017     @PREFPERIOPPHARMACY @   Your procedure is scheduled on  02/17/2017   Report to [facility] at  1015  A.M.  Call this number if you have problems the morning of surgery:  [phone]   Remember:  Do not eat food or drink liquids after midnight.  Take these medicines the morning of surgery with A SIP OF WATER  Celebrex, dexilant, norco, losartan.   Do not wear jewelry, make-up or nail polish.  Do not wear lotions, powders, or perfumes, or deodorant.  Do not shave 48 hours prior to surgery.  Men may shave face and neck.  Do not bring valuables to the hospital.  [facility] is not responsible for any belongings or valuables.  Contacts, dentures or bridgework may not be worn into surgery.  Leave your suitcase in the car.  After surgery it may be brought to your room.  For patients admitted to the hospital, discharge time will be determined by your treatment team.  Patients discharged the day of surgery will not be allowed to drive home.   Name and phone number of your driver:   family Special instructions:  Follow the diet and prep instructions given to you by Dr Roseanne Kaufman office.  Please read over the following fact sheets that you were given. Anesthesia Post-op Instructions and Care and Recovery After Surgery       Colonoscopy, Adult A colonoscopy is an exam to look at the large intestine. It is done to check for problems, such as:  Lumps (tumors).  Growths (polyps).  Swelling (inflammation).  Bleeding.  What happens before the procedure? Eating and drinking Follow instructions from your doctor about eating and drinking. These instructions may include:  A few days before the procedure - follow a low-fiber diet. ? Avoid nuts. ? Avoid seeds. ? Avoid dried fruit. ? Avoid raw fruits. ? Avoid vegetables.  1-3 days before the procedure - follow a clear liquid diet. Avoid liquids that have red or purple dye. Drink only clear  liquids, such as: ? Clear broth or bouillon. ? Black coffee or tea. ? Clear juice. ? Clear soft drinks or sports drinks. ? Gelatin dessert. ? Popsicles.  On the day of the procedure - do not eat or drink anything during the 2 hours before the procedure.  Bowel prep If you were prescribed an oral bowel prep:  Take it as told by your doctor. Starting the day before your procedure, you will need to drink a lot of liquid. The liquid will cause you to poop (have bowel movements) until your poop is almost clear or light green.  If your skin or butt gets irritated from diarrhea, you may: ? Wipe the area with wipes that have medicine in them, such as adult wet wipes with aloe and vitamin E. ? Put something on your skin that soothes the area, such as petroleum jelly.  If you throw up (vomit) while drinking the bowel prep, take a break for up to 60 minutes. Then begin the bowel prep again. If you keep throwing up and you cannot take the bowel prep without throwing up, call your doctor.  General instructions  Ask your doctor about changing or stopping your normal medicines. This is important if you take diabetes medicines or blood thinners.  Plan to have someone take you home from the hospital or clinic. What happens during the procedure?  An IV tube may  be put into one of your veins.  You will be given medicine to help you relax (sedative).  To reduce your risk of infection: ? Your doctors will wash their hands. ? Your anal area will be washed with soap.  You will be asked to lie on your side with your knees bent.  Your doctor will get a long, thin, flexible tube ready. The tube will have a camera and a light on the end.  The tube will be put into your anus.  The tube will be gently put into your large intestine.  Air will be delivered into your large intestine to keep it open. You may feel some pressure or cramping.  The camera will be used to take photos.  A small tissue  sample may be removed from your body to be looked at under a microscope (biopsy). If any possible problems are found, the tissue will be sent to a lab for testing.  If small growths are found, your doctor may remove them and have them checked for cancer.  The tube that was put into your anus will be slowly removed. The procedure may vary among doctors and hospitals. What happens after the procedure?  Your doctor will check on you often until the medicines you were given have worn off.  Do not drive for 24 hours after the procedure.  You may have a small amount of blood in your poop.  You may pass gas.  You may have mild cramps or bloating in your belly (abdomen).  It is up to you to get the results of your procedure. Ask your doctor, or the department performing the procedure, when your results will be ready. This information is not intended to replace advice given to you by your health care provider. Make sure you discuss any questions you have with your health care provider. Document Released: 01/26/2010 Document Revised: 10/25/2015 Document Reviewed: 03/07/2015 Elsevier Interactive Patient Education  2017 [employer]  Colonoscopy, Adult, Care After This sheet gives you information about how to care for yourself after your procedure. Your health care provider may also give you more specific instructions. If you have problems or questions, contact your health care provider. What can I expect after the procedure? After the procedure, it is common to have:  A small amount of blood in your stool for 24 hours after the procedure.  Some gas.  Mild abdominal cramping or bloating.  Follow these instructions at home: General instructions   For the first 24 hours after the procedure: ? Do not drive or use machinery. ? Do not sign important documents. ? Do not drink alcohol. ? Do your regular daily activities at a slower pace than normal. ? Eat soft, easy-to-digest foods. ? Rest  often.  Take over-the-counter or prescription medicines only as told by your health care provider.  It is up to you to get the results of your procedure. Ask your health care provider, or the department performing the procedure, when your results will be ready. Relieving cramping and bloating  Try walking around when you have cramps or feel bloated.  Apply heat to your abdomen as told by your health care provider. Use a heat source that your health care provider recommends, such as a moist heat pack or a heating pad. ? Place a towel between your skin and the heat source. ? Leave the heat on for 20-30 minutes. ? Remove the heat if your skin turns bright red. This is especially important if you  are unable to feel pain, heat, or cold. You may have a greater risk of getting burned. Eating and drinking  Drink enough fluid to keep your urine clear or pale yellow.  Resume your normal diet as instructed by your health care provider. Avoid heavy or fried foods that are hard to digest.  Avoid drinking alcohol for as long as instructed by your health care provider. Contact a health care provider if:  You have blood in your stool 2-3 days after the procedure. Get help right away if:  You have more than a small spotting of blood in your stool.  You pass large blood clots in your stool.  Your abdomen is swollen.  You have nausea or vomiting.  You have a fever.  You have increasing abdominal pain that is not relieved with medicine. This information is not intended to replace advice given to you by your health care provider. Make sure you discuss any questions you have with your health care provider. Document Released: 08/08/2003 Document Revised: 09/18/2015 Document Reviewed: 03/07/2015 Elsevier Interactive Patient Education  2018 [place] Anesthesia is a term that refers to techniques, procedures, and medicines that help a person stay safe and comfortable  during a medical procedure. Monitored anesthesia care, or sedation, is one type of anesthesia. Your anesthesia specialist may recommend sedation if you will be having a procedure that does not require you to be unconscious, such as:  Cataract surgery.  A dental procedure.  A biopsy.  A colonoscopy.  During the procedure, you may receive a medicine to help you relax (sedative). There are three levels of sedation:  Mild sedation. At this level, you may feel awake and relaxed. You will be able to follow directions.  Moderate sedation. At this level, you will be sleepy. You may not remember the procedure.  Deep sedation. At this level, you will be asleep. You will not remember the procedure.  The more medicine you are given, the deeper your level of sedation will be. Depending on how you respond to the procedure, the anesthesia specialist may change your level of sedation or the type of anesthesia to fit your needs. An anesthesia specialist will monitor you closely during the procedure. Let your health care provider know about:  Any allergies you have.  All medicines you are taking, including vitamins, herbs, eye drops, creams, and over-the-counter medicines.  Any use of steroids (by mouth or as a cream).  Any problems you or family members have had with sedatives and anesthetic medicines.  Any blood disorders you have.  Any surgeries you have had.  Any medical conditions you have, such as sleep apnea.  Whether you are pregnant or may be pregnant.  Any use of cigarettes, alcohol, or street drugs. What are the risks? Generally, this is a safe procedure. However, problems may occur, including:  Getting too much medicine (oversedation).  Nausea.  Allergic reaction to medicines.  Trouble breathing. If this happens, a breathing tube may be used to help with breathing. It will be removed when you are awake and breathing on your own.  Heart trouble.  Lung trouble.  Before  the procedure Staying hydrated Follow instructions from your health care provider about hydration, which may include:  Up to 2 hours before the procedure - you may continue to drink clear liquids, such as water, clear fruit juice, black coffee, and plain tea.  Eating and drinking restrictions Follow instructions from your health care provider about eating and drinking,  which may include:  8 hours before the procedure - stop eating heavy meals or foods such as meat, fried foods, or fatty foods.  6 hours before the procedure - stop eating light meals or foods, such as toast or cereal.  6 hours before the procedure - stop drinking milk or drinks that contain milk.  2 hours before the procedure - stop drinking clear liquids.  Medicines Ask your health care provider about:  Changing or stopping your regular medicines. This is especially important if you are taking diabetes medicines or blood thinners.  Taking medicines such as aspirin and ibuprofen. These medicines can thin your blood. Do not take these medicines before your procedure if your health care provider instructs you not to.  Tests and exams  You will have a physical exam.  You may have blood tests done to show: ? How well your kidneys and liver are working. ? How well your blood can clot.  General instructions  Plan to have someone take you home from the hospital or clinic.  If you will be going home right after the procedure, plan to have someone with you for 24 hours.  What happens during the procedure?  Your blood pressure, heart rate, breathing, level of pain and overall condition will be monitored.  An IV tube will be inserted into one of your veins.  Your anesthesia specialist will give you medicines as needed to keep you comfortable during the procedure. This may mean changing the level of sedation.  The procedure will be performed. After the procedure  Your blood pressure, heart rate, breathing rate, and  blood oxygen level will be monitored until the medicines you were given have worn off.  Do not drive for 24 hours if you received a sedative.  You may: ? Feel sleepy, clumsy, or nauseous. ? Feel forgetful about what happened after the procedure. ? Have a sore throat if you had a breathing tube during the procedure. ? Vomit. This information is not intended to replace advice given to you by your health care provider. Make sure you discuss any questions you have with your health care provider. Document Released: 09/19/2004 Document Revised: 06/02/2015 Document Reviewed: 04/16/2015 Elsevier Interactive Patient Education  2018 [place], Care After These instructions provide you with information about caring for yourself after your procedure. Your health care provider may also give you more specific instructions. Your treatment has been planned according to current medical practices, but problems sometimes occur. Call your health care provider if you have any problems or questions after your procedure. What can I expect after the procedure? After your procedure, it is common to:  Feel sleepy for several hours.  Feel clumsy and have poor balance for several hours.  Feel forgetful about what happened after the procedure.  Have poor judgment for several hours.  Feel nauseous or vomit.  Have a sore throat if you had a breathing tube during the procedure.  Follow these instructions at home: For at least 24 hours after the procedure:   Do not: ? Participate in activities in which you could fall or become injured. ? Drive. ? Use heavy machinery. ? Drink alcohol. ? Take sleeping pills or medicines that cause drowsiness. ? Make important decisions or sign legal documents. ? Take care of children on your own.  Rest. Eating and drinking  Follow the diet that is recommended by your health care provider.  If you vomit, drink water, juice, or soup when you  can drink without vomiting.  Make sure you have little or no nausea before eating solid foods. General instructions  Have a responsible adult stay with you until you are awake and alert.  Take over-the-counter and prescription medicines only as told by your health care provider.  If you smoke, do not smoke without supervision.  Keep all follow-up visits as told by your health care provider. This is important. Contact a health care provider if:  You keep feeling nauseous or you keep vomiting.  You feel light-headed.  You develop a rash.  You have a fever. Get help right away if:  You have trouble breathing. This information is not intended to replace advice given to you by your health care provider. Make sure you discuss any questions you have with your health care provider. Document Released: 04/16/2015 Document Revised: 08/16/2015 Document Reviewed: 04/16/2015 Elsevier Interactive Patient Education  [employer].

## 2017-02-12 ENCOUNTER — Ambulatory Visit (INDEPENDENT_AMBULATORY_CARE_PROVIDER_SITE_OTHER): Payer: Medicare Other | Admitting: Vascular Surgery

## 2017-02-12 ENCOUNTER — Telehealth: Payer: Self-pay

## 2017-02-12 ENCOUNTER — Encounter: Payer: Self-pay | Admitting: Vascular Surgery

## 2017-02-12 ENCOUNTER — Other Ambulatory Visit: Payer: Self-pay

## 2017-02-12 DIAGNOSIS — I771 Stricture of artery: Secondary | ICD-10-CM | POA: Diagnosis not present

## 2017-02-12 MED ORDER — PEG 3350-KCL-NA BICARB-NACL 420 G PO SOLR: 4000.0000 mL | ORAL | 0 refills | Status: DC

## 2017-02-12 NOTE — Telephone Encounter (Signed)
Pt called office with questions about TCS prep. Answered questions.

## 2017-02-12 NOTE — Telephone Encounter (Signed)
Called pt. TCS w/Propofol for 02/17/17 changed to 10:45am. He will arrive at 8:45am. Instructed to start drinking 2nd half of prep that morning at 5:45am. NPO after 7:45am. Endo scheduler is aware.

## 2017-02-13 ENCOUNTER — Encounter: Payer: Self-pay | Admitting: Internal Medicine

## 2017-02-13 ENCOUNTER — Ambulatory Visit (INDEPENDENT_AMBULATORY_CARE_PROVIDER_SITE_OTHER): Payer: Medicare Other | Admitting: Internal Medicine

## 2017-02-13 VITALS — BP 140/82 | HR 82 | Ht 70.0 in | Wt 175.0 lb

## 2017-02-13 DIAGNOSIS — I10 Essential (primary) hypertension: Secondary | ICD-10-CM | POA: Diagnosis not present

## 2017-02-13 DIAGNOSIS — J449 Chronic obstructive pulmonary disease, unspecified: Secondary | ICD-10-CM | POA: Diagnosis not present

## 2017-02-13 NOTE — Assessment & Plan Note (Signed)
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   Continue losartan 50 mg daily - increase to 100 mg daily if needed   Follow up per Primary Care planned

## 2017-02-13 NOTE — Assessment & Plan Note (Signed)
Quit smoking 2012 PFT's 01/04/15  FEV1 2.15 (71 % ) ratio 58  p 14 % improvement from saba p ? prior to study with DLCO  70 % corrects to 94  % for alv volume  With only mild curvature f/v loop  - Spirometry 01/28/2017  FEV1 1.59 (53%)  Ratio 59 off all rx  - 01/28/2017  After extensive coaching inhaler device  effectiveness =   75%  So symb 160 2bid and d/c acei and double the dexilant > marked improvement 02/13/2017    Clearly improved off acei and DPI > no change needed though needs full pfts to complete the w/u and restage accurately > return in 6 weeks   I had an extended discussion with the patient reviewing all relevant studies completed to date and  lasting 15 to 20 minutes of a 25 minute visit    Each maintenance medication was reviewed in detail including most importantly the difference between maintenance and prns and under what circumstances the prns are to be triggered using an action plan format that is not reflected in the computer generated alphabetically organized AVS.    Please see AVS for specific instructions unique to this visit that I personally wrote and verbalized to the the pt in detail and then reviewed with pt  by my nurse highlighting any  changes in therapy recommended at today's visit to their plan of care.

## 2017-02-13 NOTE — Patient Instructions (Signed)
Please schedule a follow up office visit in 6 weeks, call sooner if needed with all medications /inhalers/ solutions in hand so we can verify exactly what you are taking. This includes all medications from all doctors and over the counters  - PFT's on return

## 2017-02-13 NOTE — Progress Notes (Signed)
Subjective:     Patient ID: [name], male   DOB: [birth year],     MRN: [id number]     .Brief patient profile:  66 yobm quit smoking 2013 with dx of copd on prn inhalers didn't really use it much and "worse over the years" and placed on BREO but worse fall  2018 so placed on Trelegy and referred to pulmonary clinic 01/28/2017 by Dr   [name]    History of Present Illness  01/28/2017 1st [facility] office visit/ [name]  ? Copd   Chief Complaint  Patient presents with  . Pulmonary Consult    Referred by Dr. [name] for eval of COPD. Pt states that he has been having SOB for the past several yrs, worse for the past 3-6 months. He gets SOB with walking short distances and up and down stairs. He has CP that comes and goes. He also has some cough with white sputum.  He is using proair once daily on average.   doe x years much worse x sev months, cp is not related to breathing or exertion localized L ant chest/ lasts just a few secs Cough is worse since sob worse/ esp hs and early in am Baseline = 15 min walk outside mostly flat but struggles if gets in a hurry or steps  Thinks proair helps his cp but note resolves in 2 secs whether uses proair or not rec Continue the dexilant but for now take it Take 30- 60 min before your first and last meals of the day until cough and breathing better then just Take 30-60 min before first meal of the day  Stop Trelegy and lisinopril Losartan 50 mg only daily takes the place of lisinopril  Plan A = Automatic = symbicort 160 Take 2 puffs first thing in am and then another 2 puffs about 12 hours later.  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 Follow-up    4 week ROV, head cold, wants to know what his diagnosis is    Dyspnea:  Limited by [facility] not sob  Cough: some better, coughing returned with "head  cold" min discolored x one week, thinks it's getting better s rx  Sleep: ok     back to one daily dexilant as hs so much better vs baseline = choking sensation   No obvious day to day or daytime variability or assoc excess/ purulent sputum or mucus plugs or hemoptysis or cp or chest tightness, subjective wheeze or overt  r hb symptoms. No unusual exposure hx or h/o childhood pna/ asthma or knowledge of premature birth.  Sleeping ok flat without nocturnal  or early am exacerbation  of respiratory  c/o's or need for noct saba. Also denies any obvious fluctuation of symptoms with weather or environmental changes or other aggravating or alleviating factors except as outlined above   Current Allergies, Complete Past Medical History, Past Surgical History, Family History, and Social History were reviewed in [employer] record.  ROS  The following are not active complaints unless bolded Hoarseness, sore throat, dysphagia, dental problems, itching, sneezing,  nasal congestion or discharge of excess mucus or purulent secretions, ear ache,   fever, chills, sweats, unintended wt loss or wt gain, classically pleuritic or exertional cp,  orthopnea pnd or leg  swelling, presyncope, palpitations, abdominal pain, anorexia, nausea, vomiting, diarrhea  or change in bowel habits or change in bladder habits, change in stools or change in urine, dysuria, hematuria,  rash, arthralgias, visual complaints, headache, numbness, weakness or ataxia or problems with walking or coordination,  change in mood/affect or memory.        Current Meds  Medication Sig  . atorvastatin (LIPITOR) 20 MG tablet Take 20 mg by mouth daily.  . budesonide-formoterol (SYMBICORT) 160-4.5 MCG/ACT inhaler Inhale 2 puffs into the lungs 2 (two) times daily.  . celecoxib (CELEBREX) 200 MG capsule Take 1 capsule (200 mg total) by mouth daily. (HOLD X 5 DAYS WHILE TAKING THE IBUPROFEN) (Patient taking differently: Take 200 mg by  mouth daily. )  . clopidogrel (PLAVIX) 75 MG tablet Take 1 tablet (75 mg total) by mouth daily.  Marland Kitchen Dexlansoprazole (DEXILANT) 30 MG capsule Take 30- 60 min before your first and last meals of the day (Patient taking differently: Take 30 mg by mouth daily. )  . glipiZIDE (GLUCOTROL) 10 MG tablet Take 10 mg by mouth daily.   Marland Kitchen HYDROcodone-acetaminophen (NORCO) 7.5-325 MG per tablet Take 1 tablet by mouth 2 (two) times daily as needed for moderate pain.   Marland Kitchen JANUVIA 100 MG tablet Take 100 mg by mouth daily.   Marland Kitchen losartan (COZAAR) 50 MG tablet Take 1 tablet (50 mg total) by mouth daily.  . metFORMIN (GLUCOPHAGE) 500 MG tablet Take 500 mg by mouth 2 (two) times daily with a meal.   . nitroGLYCERIN (NITROSTAT) 0.4 MG SL tablet Place 1 tablet (0.4 mg total) under the tongue every 5 (five) minutes as needed for chest pain.  . ONE TOUCH ULTRA TEST test strip   . polyethylene glycol-electrolytes (TRILYTE) 420 g solution Take 4,000 mLs by mouth as directed.  Marland Kitchen PROAIR HFA 108 (90 BASE) MCG/ACT inhaler Inhale 2 puffs into the lungs every 6 (six) hours as needed for wheezing or shortness of breath.   . vitamin C (ASCORBIC ACID) 500 MG tablet Take 500 mg by mouth daily.  . VOLTAREN 1 % GEL Apply 1 application topically daily as needed (pain).          Objective:   Physical Exam    amb  bm easily confused with details of care   .  02/13/2017        175   01/28/17 175 lb 12.8 oz (79.7 kg)  01/16/17 175 lb (79.4 kg)  11/01/16 170 lb (77.1 kg)      Vital signs reviewed - Note on arrival 02 sats  98% on RA  And BP 140/82     HEENT: nl  turbinates bilaterally, and oropharynx. Nl external ear canals without cough reflex - upper and lower partial plates     LUNGS: no acc muscle use,  Minimal barrel chest with mid exp rhonchi bilaterally much better with plm    HEENT: nl dentition, turbinates bilaterally, and oropharynx. Nl external ear canals without cough reflex   NECK :  without JVD/Nodes/TM/ nl  carotid upstrokes bilaterally   LUNGS: no acc muscle use,  Minimal barrel contour chest with min pseudowheeze clears with plm    CV:  RRR  no s3 or murmur or increase in P2, and no edema   ABD:  soft and nontender with nl inspiratory excursion in the supine position. No bruits or organomegaly appreciated, bowel sounds nl  MS:  Nl gait/ ext warm without deformities, calf tenderness, cyanosis or clubbing No obvious joint  restrictions   SKIN: warm and dry without lesions    NEURO:  alert, approp, nl sensorium with  no motor or cerebellar deficits apparent.       I personally reviewed images and agree with radiology impression as follows:  CXR:   01/10/17 Mild copd changes only     Assessment:

## 2017-02-14 ENCOUNTER — Other Ambulatory Visit: Payer: Self-pay

## 2017-02-14 ENCOUNTER — Encounter (HOSPITAL_COMMUNITY)
Admission: RE | Admit: 2017-02-14 | Discharge: 2017-02-14 | Disposition: A | Discharge status: Home / Self Care | Payer: Medicare Other | Source: Ambulatory Visit | Attending: Internal Medicine | Admitting: Internal Medicine

## 2017-02-14 ENCOUNTER — Encounter (HOSPITAL_COMMUNITY): Payer: Self-pay

## 2017-02-14 DIAGNOSIS — I2510 Atherosclerotic heart disease of native coronary artery without angina pectoris: Secondary | ICD-10-CM | POA: Diagnosis not present

## 2017-02-14 DIAGNOSIS — Z87891 Personal history of nicotine dependence: Secondary | ICD-10-CM | POA: Diagnosis not present

## 2017-02-14 DIAGNOSIS — J449 Chronic obstructive pulmonary disease, unspecified: Secondary | ICD-10-CM | POA: Diagnosis not present

## 2017-02-14 DIAGNOSIS — K640 First degree hemorrhoids: Secondary | ICD-10-CM | POA: Diagnosis not present

## 2017-02-14 DIAGNOSIS — Z7902 Long term (current) use of antithrombotics/antiplatelets: Secondary | ICD-10-CM | POA: Diagnosis not present

## 2017-02-14 DIAGNOSIS — K219 Gastro-esophageal reflux disease without esophagitis: Secondary | ICD-10-CM | POA: Diagnosis not present

## 2017-02-14 DIAGNOSIS — Z95820 Peripheral vascular angioplasty status with implants and grafts: Secondary | ICD-10-CM | POA: Diagnosis not present

## 2017-02-14 DIAGNOSIS — Z791 Long term (current) use of non-steroidal anti-inflammatories (NSAID): Secondary | ICD-10-CM | POA: Diagnosis not present

## 2017-02-14 DIAGNOSIS — E785 Hyperlipidemia, unspecified: Secondary | ICD-10-CM | POA: Diagnosis not present

## 2017-02-14 DIAGNOSIS — R933 Abnormal findings on diagnostic imaging of other parts of digestive tract: Secondary | ICD-10-CM | POA: Diagnosis not present

## 2017-02-14 DIAGNOSIS — Z7984 Long term (current) use of oral hypoglycemic drugs: Secondary | ICD-10-CM | POA: Diagnosis not present

## 2017-02-14 DIAGNOSIS — I6529 Occlusion and stenosis of unspecified carotid artery: Secondary | ICD-10-CM | POA: Diagnosis not present

## 2017-02-14 DIAGNOSIS — Z7951 Long term (current) use of inhaled steroids: Secondary | ICD-10-CM | POA: Diagnosis not present

## 2017-02-14 DIAGNOSIS — Z89021 Acquired absence of right finger(s): Secondary | ICD-10-CM | POA: Diagnosis not present

## 2017-02-14 DIAGNOSIS — E1151 Type 2 diabetes mellitus with diabetic peripheral angiopathy without gangrene: Secondary | ICD-10-CM | POA: Diagnosis not present

## 2017-02-14 DIAGNOSIS — K5730 Diverticulosis of large intestine without perforation or abscess without bleeding: Secondary | ICD-10-CM | POA: Diagnosis not present

## 2017-02-14 DIAGNOSIS — Z79899 Other long term (current) drug therapy: Secondary | ICD-10-CM | POA: Diagnosis not present

## 2017-02-14 DIAGNOSIS — I10 Essential (primary) hypertension: Secondary | ICD-10-CM | POA: Diagnosis not present

## 2017-02-14 DIAGNOSIS — Z86718 Personal history of other venous thrombosis and embolism: Secondary | ICD-10-CM | POA: Diagnosis not present

## 2017-02-14 DIAGNOSIS — K921 Melena: Secondary | ICD-10-CM | POA: Diagnosis not present

## 2017-02-14 DIAGNOSIS — K6389 Other specified diseases of intestine: Secondary | ICD-10-CM | POA: Diagnosis present

## 2017-02-14 HISTORY — DX: Unspecified osteoarthritis, unspecified site: M19.90

## 2017-02-14 LAB — SURGICAL PCR SCREEN
MRSA, PCR: NEGATIVE
Staphylococcus aureus: NEGATIVE

## 2017-02-17 ENCOUNTER — Encounter (HOSPITAL_COMMUNITY)
Admission: RE | Disposition: A | Discharge status: Home / Self Care | Payer: Self-pay | Source: Ambulatory Visit | Attending: Internal Medicine

## 2017-02-17 ENCOUNTER — Ambulatory Visit (HOSPITAL_COMMUNITY)
Admission: RE | Admit: 2017-02-17 | Discharge: 2017-02-17 | Disposition: A | Discharge status: Home / Self Care | Payer: Medicare Other | Source: Ambulatory Visit | Attending: Internal Medicine | Admitting: Internal Medicine

## 2017-02-17 ENCOUNTER — Ambulatory Visit (HOSPITAL_COMMUNITY): Payer: Medicare Other | Admitting: Anesthesiology

## 2017-02-17 ENCOUNTER — Encounter (HOSPITAL_COMMUNITY): Payer: Self-pay

## 2017-02-17 ENCOUNTER — Other Ambulatory Visit: Payer: Self-pay

## 2017-02-17 DIAGNOSIS — I70212 Atherosclerosis of native arteries of extremities with intermittent claudication, left leg: Secondary | ICD-10-CM

## 2017-02-17 DIAGNOSIS — I739 Peripheral vascular disease, unspecified: Secondary | ICD-10-CM

## 2017-02-17 DIAGNOSIS — Z89021 Acquired absence of right finger(s): Secondary | ICD-10-CM | POA: Insufficient documentation

## 2017-02-17 DIAGNOSIS — K921 Melena: Secondary | ICD-10-CM | POA: Diagnosis not present

## 2017-02-17 DIAGNOSIS — E785 Hyperlipidemia, unspecified: Secondary | ICD-10-CM | POA: Insufficient documentation

## 2017-02-17 DIAGNOSIS — K640 First degree hemorrhoids: Secondary | ICD-10-CM | POA: Insufficient documentation

## 2017-02-17 DIAGNOSIS — R933 Abnormal findings on diagnostic imaging of other parts of digestive tract: Secondary | ICD-10-CM | POA: Diagnosis not present

## 2017-02-17 DIAGNOSIS — J449 Chronic obstructive pulmonary disease, unspecified: Secondary | ICD-10-CM | POA: Insufficient documentation

## 2017-02-17 DIAGNOSIS — Z95828 Presence of other vascular implants and grafts: Secondary | ICD-10-CM

## 2017-02-17 DIAGNOSIS — Z87891 Personal history of nicotine dependence: Secondary | ICD-10-CM | POA: Insufficient documentation

## 2017-02-17 DIAGNOSIS — Z7951 Long term (current) use of inhaled steroids: Secondary | ICD-10-CM | POA: Insufficient documentation

## 2017-02-17 DIAGNOSIS — Z791 Long term (current) use of non-steroidal anti-inflammatories (NSAID): Secondary | ICD-10-CM | POA: Insufficient documentation

## 2017-02-17 DIAGNOSIS — K219 Gastro-esophageal reflux disease without esophagitis: Secondary | ICD-10-CM | POA: Insufficient documentation

## 2017-02-17 DIAGNOSIS — I6529 Occlusion and stenosis of unspecified carotid artery: Secondary | ICD-10-CM | POA: Insufficient documentation

## 2017-02-17 DIAGNOSIS — Z79899 Other long term (current) drug therapy: Secondary | ICD-10-CM | POA: Insufficient documentation

## 2017-02-17 DIAGNOSIS — I2510 Atherosclerotic heart disease of native coronary artery without angina pectoris: Secondary | ICD-10-CM | POA: Insufficient documentation

## 2017-02-17 DIAGNOSIS — Z95820 Peripheral vascular angioplasty status with implants and grafts: Secondary | ICD-10-CM | POA: Insufficient documentation

## 2017-02-17 DIAGNOSIS — Z7902 Long term (current) use of antithrombotics/antiplatelets: Secondary | ICD-10-CM | POA: Insufficient documentation

## 2017-02-17 DIAGNOSIS — R935 Abnormal findings on diagnostic imaging of other abdominal regions, including retroperitoneum: Secondary | ICD-10-CM

## 2017-02-17 DIAGNOSIS — Z86718 Personal history of other venous thrombosis and embolism: Secondary | ICD-10-CM | POA: Insufficient documentation

## 2017-02-17 DIAGNOSIS — E1151 Type 2 diabetes mellitus with diabetic peripheral angiopathy without gangrene: Secondary | ICD-10-CM | POA: Insufficient documentation

## 2017-02-17 DIAGNOSIS — K5730 Diverticulosis of large intestine without perforation or abscess without bleeding: Secondary | ICD-10-CM | POA: Diagnosis not present

## 2017-02-17 DIAGNOSIS — I10 Essential (primary) hypertension: Secondary | ICD-10-CM | POA: Insufficient documentation

## 2017-02-17 DIAGNOSIS — Z7984 Long term (current) use of oral hypoglycemic drugs: Secondary | ICD-10-CM | POA: Insufficient documentation

## 2017-02-17 HISTORY — PX: COLONOSCOPY WITH PROPOFOL: SHX5780

## 2017-02-17 LAB — GLUCOSE, CAPILLARY
Glucose-Capillary: 135 mg/dL — ABNORMAL HIGH (ref 65–99)
Glucose-Capillary: 125 mg/dL — ABNORMAL HIGH (ref 65–99)

## 2017-02-17 SURGERY — COLONOSCOPY WITH PROPOFOL
Anesthesia: Monitor Anesthesia Care

## 2017-02-17 MED ORDER — PROPOFOL 10 MG/ML IV BOLUS
INTRAVENOUS | Status: AC
  Filled 2017-02-17: qty 40

## 2017-02-17 MED ORDER — CHLORHEXIDINE GLUCONATE CLOTH 2 % EX PADS: 6.0000 | MEDICATED_PAD | Freq: Once | CUTANEOUS | Status: DC

## 2017-02-17 MED ORDER — MIDAZOLAM HCL 2 MG/2ML IJ SOLN
INTRAMUSCULAR | Status: AC
  Filled 2017-02-17: qty 2

## 2017-02-17 MED ORDER — FENTANYL CITRATE (PF) 100 MCG/2ML IJ SOLN
INTRAMUSCULAR | Status: AC
  Filled 2017-02-17: qty 2

## 2017-02-17 MED ORDER — FENTANYL CITRATE (PF) 100 MCG/2ML IJ SOLN
25.0000 ug | Freq: Once | INTRAMUSCULAR | Status: AC
  Administered 2017-02-17: 25 ug via INTRAVENOUS

## 2017-02-17 MED ORDER — MIDAZOLAM HCL 2 MG/2ML IJ SOLN
1.0000 mg | INTRAMUSCULAR | Status: AC
  Administered 2017-02-17: 2 mg via INTRAVENOUS

## 2017-02-17 MED ORDER — MIDAZOLAM HCL 5 MG/5ML IJ SOLN
INTRAMUSCULAR | Status: DC | PRN
  Administered 2017-02-17: 2 mg via INTRAVENOUS

## 2017-02-17 MED ORDER — PROPOFOL 500 MG/50ML IV EMUL
INTRAVENOUS | Status: DC | PRN
  Administered 2017-02-17: 125 ug/kg/min via INTRAVENOUS

## 2017-02-17 MED ORDER — LACTATED RINGERS IV SOLN
INTRAVENOUS | Status: DC
  Administered 2017-02-17: 11:00:00 via INTRAVENOUS

## 2017-02-17 NOTE — Anesthesia Preprocedure Evaluation (Signed)
Anesthesia Evaluation  Patient identified by MRN, date of birth, ID band Patient awake    Reviewed: Allergy & Precautions, NPO status , Patient's Chart, lab work & pertinent test results  Airway Mallampati: III  TM Distance: >3 FB     Dental  (+) Edentulous Upper, Edentulous Lower   Pulmonary COPD, former smoker,    breath sounds clear to auscultation       Cardiovascular hypertension, Pt. on medications + CAD, + Peripheral Vascular Disease ( s/p left fem-pop bypass 2007; PTA and stenting of right iliac artery 09/2011) and + DVT   Rhythm:Regular Rate:Normal     Neuro/Psych    GI/Hepatic GERD  Controlled and Medicated,  Endo/Other  diabetes, Type 2  Renal/GU      Musculoskeletal  (+) Arthritis , Rheumatoid disorders,    Abdominal   Peds  Hematology   Anesthesia Other Findings   Reproductive/Obstetrics                             Anesthesia Physical Anesthesia Plan  ASA: III  Anesthesia Plan: MAC   Post-op Pain Management:    Induction: Intravenous  PONV Risk Score and Plan:   Airway Management Planned: Simple Face Mask  Additional Equipment:   Intra-op Plan:   Post-operative Plan:   Informed Consent: I have reviewed the patients History and Physical, chart, labs and discussed the procedure including the risks, benefits and alternatives for the proposed anesthesia with the patient or authorized representative who has indicated his/her understanding and acceptance.     Plan Discussed with:   Anesthesia Plan Comments:         Anesthesia Quick Evaluation

## 2017-02-17 NOTE — Transfer of Care (Signed)
Immediate Anesthesia Transfer of Care Note  Patient: [name]  Procedure(s) Performed: COLONOSCOPY WITH PROPOFOL (N/A )  Patient Location: PACU  Anesthesia Type:MAC  Level of Consciousness: drowsy  Airway & Oxygen Therapy: Patient Spontanous Breathing and Patient connected to face mask oxygen  Post-op Assessment: Report given to RN, Post -op Vital signs reviewed and stable and Patient moving all extremities  Post vital signs: Reviewed and stable  Last Vitals:  Vitals:   02/17/17 1100 02/17/17 1105  BP: 125/85 121/84  Pulse:    Resp: 16 15  Temp:    SpO2: 95% 96%    Last Pain:  Vitals:   02/17/17 1012  TempSrc: Oral  PainSc: 0-No pain         Complications: No apparent anesthesia complications

## 2017-02-17 NOTE — Interval H&P Note (Signed)
History and Physical Interval Note:  02/17/2017 11:08 AM  [name]  has presented today for surgery, with the diagnosis of abnormal CT abdomen, hematochezia  The various methods of treatment have been discussed with the patient and family. After consideration of risks, benefits and other options for treatment, the patient has consented to  Procedure(s) with comments: COLONOSCOPY WITH PROPOFOL (N/A) - 2:45pm as a surgical intervention .  The patient's history has been reviewed, patient examined, no change in status, stable for surgery.  I have reviewed the patient's chart and labs.  Questions were answered to the patient's satisfaction.     Robert Rourk  No change. Diagnostic colonoscopy per plan.  The risks, benefits, limitations, alternatives and imponderables have been reviewed with the patient. Questions have been answered. All parties are agreeable.

## 2017-02-17 NOTE — Anesthesia Postprocedure Evaluation (Signed)
Anesthesia Post Note  Patient: [name]  Procedure(s) Performed: COLONOSCOPY WITH PROPOFOL (N/A )  Patient location during evaluation: PACU Anesthesia Type: MAC Level of consciousness: awake and patient cooperative Pain management: pain level controlled Vital Signs Assessment: post-procedure vital signs reviewed and stable Respiratory status: spontaneous breathing, nonlabored ventilation and respiratory function stable Cardiovascular status: blood pressure returned to baseline Postop Assessment: no apparent nausea or vomiting Anesthetic complications: no     Last Vitals:  Vitals:   02/17/17 1100 02/17/17 1105  BP: 125/85 121/84  Pulse:    Resp: 16 15  Temp:    SpO2: 95% 96%    Last Pain:  Vitals:   02/17/17 1012  TempSrc: Oral  PainSc: 0-No pain                 Samantha Ragen J

## 2017-02-17 NOTE — Op Note (Signed)
[facility] Patient Name: [name] Procedure Date: 02/17/2017 11:08 AM MRN: [id number] Date of Birth: Jul 06, 1950 Attending MD: Norvel Richards , MD CSN: [id number] Age: 67 Admit Type: Outpatient Procedure:                Colonoscopy Indications:              Abnormal CT of the GI tract; hematochezia Providers:                Norvel Richards, MD, Lurline Del, RN, Nelma Rothman, Technician Referring MD:              Medicines:                Propofol per Anesthesia Complications:            No immediate complications. Estimated Blood Loss:     Estimated blood loss: none. Procedure:                Pre-Anesthesia Assessment:                           - Prior to the procedure, a History and Physical                            was performed, and patient medications and                            allergies were reviewed. The patient's tolerance of                            previous anesthesia was also reviewed. The risks                            and benefits of the procedure and the sedation                            options and risks were discussed with the patient.                            All questions were answered, and informed consent                            was obtained. Prior Anticoagulants: The patient has                            taken no previous anticoagulant or antiplatelet                            agents. ASA Grade Assessment: III - A patient with                            severe systemic disease. After reviewing the risks  and benefits, the patient was deemed in                            satisfactory condition to undergo the procedure.                           After obtaining informed consent, the colonoscope                            was passed under direct vision. Throughout the                            procedure, the patient's blood pressure, pulse, and                            oxygen  saturations were monitored continuously. The                            EC-[id number] ([id number]) scope was introduced through                            the and advanced to the the cecum, identified by                            appendiceal orifice and ileocecal valve. The                            EC-[id number] ([id number]) scope was introduced through                            the and advanced to the. The colonoscopy was                            performed without difficulty. The patient tolerated                            the procedure well. The quality of the bowel                            preparation was adequate. The ileocecal valve,                            appendiceal orifice, and rectum were photographed. Scope In: 11:35:03 AM Scope Out: 11:46:37 AM Scope Withdrawal Time: 0 hours 7 minutes 7 seconds  Total Procedure Duration: 0 hours 11 minutes 34 seconds  Findings:      The perianal and digital rectal examinations were normal.      Many small and large-mouthed diverticula were found in the sigmoid       colon, descending colon and transverse colon.      Internal hemorrhoids were found during retroflexion. The hemorrhoids       were moderate, medium-sized and Grade I (internal hemorrhoids that do       not prolapse).      The exam was otherwise without abnormality on direct and retroflexion  views. Ascending segment seen well. No mucsal abnormalities observed. Impression:               - Diverticulosis in the sigmoid colon, in the                            descending colon and in the transverse colon.                           - Internal hemorrhoids.                           - The examination was otherwise normal on direct                            and retroflexion views.                           - No specimens collected. I suspect CT findings                            were artifactual in origin. I suspect benign                            anorectal bleeding from  hemorrhoids. Moderate Sedation:      Moderate (conscious) sedation was personally administered by an       anesthesia professional. The following parameters were monitored: oxygen       saturation, heart rate, blood pressure, respiratory rate, EKG, adequacy       of pulmonary ventilation, and response to care. Total physician       intraservice time was 17 minutes. Recommendation:           - Patient has a contact number available for                            emergencies. The signs and symptoms of potential                            delayed complications were discussed with the                            patient. Return to normal activities tomorrow.                            Written discharge instructions were provided to the                            patient.                           - Resume previous diet.                           - Continue present medications. Begin Benefiber 1  tablespoon twice daily. Course of Anusol HC cream.                            Office visit with [place] in 3 months.                           - Repeat colonoscopy in 10 years for screening                            purposes.                           - Return to GI clinic in 3 months. Procedure Code(s):        --- Professional ---                           [phone], Colonoscopy, flexible; diagnostic, including                            collection of specimen(s) by brushing or washing,                            when performed (separate procedure) Diagnosis Code(s):        --- Professional ---                           K64.0, First degree hemorrhoids                           K57.30, Diverticulosis of large intestine without                            perforation or abscess without bleeding                           R93.3, Abnormal findings on diagnostic imaging of                            other parts of digestive tract CPT copyright 2016 [employer]. All rights  reserved. The codes documented in this report are preliminary and upon coder review may  be revised to meet current compliance requirements. Cristopher Estimable. Janalynn Eder, MD Norvel Richards, MD 02/17/2017 11:52:27 AM This report has been signed electronically. Number of Addenda: 0

## 2017-02-17 NOTE — Discharge Instructions (Addendum)
Colonoscopy Discharge Instructions  Read the instructions outlined below and refer to this sheet in the next few weeks. These discharge instructions provide you with general information on caring for yourself after you leave the hospital. Your doctor may also give you specific instructions. While your treatment has been planned according to the most current medical practices available, unavoidable complications occasionally occur. If you have any problems or questions after discharge, call Dr. Gala Romney at [phone]. ACTIVITY  You may resume your regular activity, but move at a slower pace for the next 24 hours.   Take frequent rest periods for the next 24 hours.   Walking will help get rid of the air and reduce the bloated feeling in your belly (abdomen).   No driving for 24 hours (because of the medicine (anesthesia) used during the test).    Do not sign any important legal documents or operate any machinery for 24 hours (because of the anesthesia used during the test).  NUTRITION  Drink plenty of fluids.   You may resume your normal diet as instructed by your doctor.   Begin with a light meal and progress to your normal diet. Heavy or fried foods are harder to digest and may make you feel sick to your stomach (nauseated).   Avoid alcoholic beverages for 24 hours or as instructed.  MEDICATIONS  You may resume your normal medications unless your doctor tells you otherwise.  WHAT YOU CAN EXPECT TODAY  Some feelings of bloating in the abdomen.   Passage of more gas than usual.   Spotting of blood in your stool or on the toilet paper.  IF YOU HAD POLYPS REMOVED DURING THE COLONOSCOPY:  No aspirin products for 7 days or as instructed.   No alcohol for 7 days or as instructed.   Eat a soft diet for the next 24 hours.  FINDING OUT THE RESULTS OF YOUR TEST Not all test results are available during your visit. If your test results are not back during the visit, make an appointment  with your caregiver to find out the results. Do not assume everything is normal if you have not heard from your caregiver or the medical facility. It is important for you to follow up on all of your test results.  SEEK IMMEDIATE MEDICAL ATTENTION IF:  You have more than a spotting of blood in your stool.   Your belly is swollen (abdominal distention).   You are nauseated or vomiting.   You have a temperature over 101.   You have abdominal pain or discomfort that is severe or gets worse throughout the day.    Diverticulosis and hemorrhoid information provided  Begin Benefiber 1 tablespoon twice daily  Begin Anusol HC cream applied to the anorectum 3 times a day  Repeat colonoscopy in 10 years for screening purposes  Office visit with [place] in 3 months   PATIENT INSTRUCTIONS POST-ANESTHESIA  IMMEDIATELY FOLLOWING SURGERY:  Do not drive or operate machinery for the first twenty four hours after surgery.  Do not make any important decisions for twenty four hours after surgery or while taking narcotic pain medications or sedatives.  If you develop intractable nausea and vomiting or a severe headache please notify your doctor immediately.  FOLLOW-UP:  Please make an appointment with your surgeon as instructed. You do not need to follow up with anesthesia unless specifically instructed to do so.  WOUND CARE INSTRUCTIONS (if applicable):  Keep a dry clean dressing on the anesthesia/puncture wound site if there  is drainage.  Once the wound has quit draining you may leave it open to air.  Generally you should leave the bandage intact for twenty four hours unless there is drainage.  If the epidural site drains for more than 36-48 hours please call the anesthesia department.  QUESTIONS?:  Please feel free to call your physician or the hospital operator if you have any questions, and they will be happy to assist you.         Diverticulosis Diverticulosis is a condition that develops when  small pouches (diverticula) form in the wall of the large intestine (colon). The colon is where water is absorbed and stool is formed. The pouches form when the inside layer of the colon pushes through weak spots in the outer layers of the colon. You may have a few pouches or many of them. What are the causes? The cause of this condition is not known. What increases the risk? The following factors may make you more likely to develop this condition:  Being older than age 76. Your risk for this condition increases with age. Diverticulosis is rare among people younger than age 41. By age 5, many people have it.  Eating a low-fiber diet.  Having frequent constipation.  Being overweight.  Not getting enough exercise.  Smoking.  Taking over-the-counter pain medicines, like aspirin and ibuprofen.  Having a family history of diverticulosis.  What are the signs or symptoms? In most people, there are no symptoms of this condition. If you do have symptoms, they may include:  Bloating.  Cramps in the abdomen.  Constipation or diarrhea.  Pain in the lower left side of the abdomen.  How is this diagnosed? This condition is most often diagnosed during an exam for other colon problems. Because diverticulosis usually has no symptoms, it often cannot be diagnosed independently. This condition may be diagnosed by:  Using a flexible scope to examine the colon (colonoscopy).  Taking an X-ray of the colon after dye has been put into the colon (barium enema).  Doing a CT scan.  How is this treated? You may not need treatment for this condition if you have never developed an infection related to diverticulosis. If you have had an infection before, treatment may include:  Eating a high-fiber diet. This may include eating more fruits, vegetables, and grains.  Taking a fiber supplement.  Taking a live bacteria supplement (probiotic).  Taking medicine to relax your colon.  Taking antibiotic  medicines.  Follow these instructions at home:  Drink 6-8 glasses of water or more each day to prevent constipation.  Try not to strain when you have a bowel movement.  If you have had an infection before: ? Eat more fiber as directed by your health care provider or your diet and nutrition specialist (dietitian). ? Take a fiber supplement or probiotic, if your health care provider approves.  Take over-the-counter and prescription medicines only as told by your health care provider.  If you were prescribed an antibiotic, take it as told by your health care provider. Do not stop taking the antibiotic even if you start to feel better.  Keep all follow-up visits as told by your health care provider. This is important. Contact a health care provider if:  You have pain in your abdomen.  You have bloating.  You have cramps.  You have not had a bowel movement in 3 days. Get help right away if:  Your pain gets worse.  Your bloating becomes very bad.  You have a fever or chills, and your symptoms suddenly get worse.  You vomit.  You have bowel movements that are bloody or black.  You have bleeding from your rectum. Summary  Diverticulosis is a condition that develops when small pouches (diverticula) form in the wall of the large intestine (colon).  You may have a few pouches or many of them.  This condition is most often diagnosed during an exam for other colon problems.  If you have had an infection related to diverticulosis, treatment may include increasing the fiber in your diet, taking supplements, or taking medicines. This information is not intended to replace advice given to you by your health care provider. Make sure you discuss any questions you have with your health care provider. Document Released: 09/21/2003 Document Revised: 11/13/2015 Document Reviewed: 11/13/2015 Elsevier Interactive Patient Education  2017 [employer].     Hemorrhoids Hemorrhoids are  swollen veins in and around the rectum or anus. There are two types of hemorrhoids:  Internal hemorrhoids. These occur in the veins that are just inside the rectum. They may poke through to the outside and become irritated and painful.  External hemorrhoids. These occur in the veins that are outside of the anus and can be felt as a painful swelling or hard lump near the anus.  Most hemorrhoids do not cause serious problems, and they can be managed with home treatments such as diet and lifestyle changes. If home treatments do not help your symptoms, procedures can be done to shrink or remove the hemorrhoids. What are the causes? This condition is caused by increased pressure in the anal area. This pressure may result from various things, including:  Constipation.  Straining to have a bowel movement.  Diarrhea.  Pregnancy.  Obesity.  Sitting for long periods of time.  Heavy lifting or other activity that causes you to strain.  Anal sex.  What are the signs or symptoms? Symptoms of this condition include:  Pain.  Anal itching or irritation.  Rectal bleeding.  Leakage of stool (feces).  Anal swelling.  One or more lumps around the anus.  How is this diagnosed? This condition can often be diagnosed through a visual exam. Other exams or tests may also be done, such as:  Examination of the rectal area with a gloved hand (digital rectal exam).  Examination of the anal canal using a small tube (anoscope).  A blood test, if you have lost a significant amount of blood.  A test to look inside the colon (sigmoidoscopy or colonoscopy).  How is this treated? This condition can usually be treated at home. However, various procedures may be done if dietary changes, lifestyle changes, and other home treatments do not help your symptoms. These procedures can help make the hemorrhoids smaller or remove them completely. Some of these procedures involve surgery, and others do not.  Common procedures include:  Rubber band ligation. Rubber bands are placed at the base of the hemorrhoids to cut off the blood supply to them.  Sclerotherapy. Medicine is injected into the hemorrhoids to shrink them.  Infrared coagulation. A type of light energy is used to get rid of the hemorrhoids.  Hemorrhoidectomy surgery. The hemorrhoids are surgically removed, and the veins that supply them are tied off.  Stapled hemorrhoidopexy surgery. A circular stapling device is used to remove the hemorrhoids and use staples to cut off the blood supply to them.  Follow these instructions at home: Eating and drinking  Eat foods that have a  lot of fiber in them, such as whole grains, beans, nuts, fruits, and vegetables. Ask your health care provider about taking products that have added fiber (fiber supplements).  Drink enough fluid to keep your urine clear or pale yellow. Managing pain and swelling  Take warm sitz baths for 20 minutes, 3-4 times a day to ease pain and discomfort.  If directed, apply ice to the affected area. Using ice packs between sitz baths may be helpful. ? Put ice in a plastic bag. ? Place a towel between your skin and the bag. ? Leave the ice on for 20 minutes, 2-3 times a day. General instructions  Take over-the-counter and prescription medicines only as told by your health care provider.  Use medicated creams or suppositories as told.  Exercise regularly.  Go to the bathroom when you have the urge to have a bowel movement. Do not wait.  Avoid straining to have bowel movements.  Keep the anal area dry and clean. Use wet toilet paper or moist towelettes after a bowel movement.  Do not sit on the toilet for long periods of time. This increases blood pooling and pain. Contact a health care provider if:  You have increasing pain and swelling that are not controlled by treatment or medicine.  You have uncontrolled bleeding.  You have difficulty having a bowel  movement, or you are unable to have a bowel movement.  You have pain or inflammation outside the area of the hemorrhoids. This information is not intended to replace advice given to you by your health care provider. Make sure you discuss any questions you have with your health care provider. Document Released: 12/22/1999 Document Revised: 05/24/2015 Document Reviewed: 09/07/2014 Elsevier Interactive Patient Education  [employer].

## 2017-02-20 ENCOUNTER — Telehealth: Payer: Self-pay | Admitting: Internal Medicine

## 2017-02-20 ENCOUNTER — Encounter (HOSPITAL_COMMUNITY): Payer: Self-pay | Admitting: Internal Medicine

## 2017-02-20 MED ORDER — AZITHROMYCIN 250 MG PO TABS: ORAL_TABLET | ORAL | 0 refills | Status: DC

## 2017-02-20 NOTE — Telephone Encounter (Signed)
zpak Take dexilant 60 mg (2 x30 or new rx for 60mg ) 30 min before first meal and pepcid 20 mg one hour befor bed until cough better x one week or until returns for ov - whichever comes first

## 2017-02-20 NOTE — Telephone Encounter (Signed)
ATC pt, no answer. Left message for pt to call back.  

## 2017-02-20 NOTE — Telephone Encounter (Signed)
Called and spoke with pt. Pt states he was instructed by MW on 02/14/16 to take Dexilant 30-60 mins before first meal of the day. Pt states he is experiencing chest discomfort and prod cough with green mucus & sweat and chills mainly at night when sleeping.  Pt is wanting to know if he should start taking Dexlilant BID.   MW please advise. Thanks  02/13/17 AVS: Please schedule a follow up office visit in 6 weeks, call sooner if needed with all medications /inhalers/ solutions in hand so we can verify exactly what you are taking. This includes all medications from all doctors and over the counters  - PFT's on return

## 2017-02-20 NOTE — Telephone Encounter (Signed)
Called pt and advised message from the provider. Pt understood and verbalized understanding. Nothing further is needed.   Rx sent into his pharmacy

## 2017-02-21 DIAGNOSIS — M722 Plantar fascial fibromatosis: Secondary | ICD-10-CM | POA: Insufficient documentation

## 2017-02-26 ENCOUNTER — Telehealth: Payer: Self-pay | Admitting: Internal Medicine

## 2017-02-26 NOTE — Telephone Encounter (Signed)
Ok to leave off and just use proair up to q 4h prn but be sure has f/u ov w/in 2 weeks

## 2017-02-26 NOTE — Telephone Encounter (Signed)
Pt has been scheduled for OV with MW on 03/11/17 @ 11:35. Nothing further is needed.

## 2017-02-26 NOTE — Telephone Encounter (Signed)
Called and spoke with pt who stated he began having a headache Saturday after taking his symbicort. Pt also had a headache Sunday each time when he took the symbicort and also Monday morning.  Due to pt having a headache each time Saturday and Sunday, and then Monday morning from after taking the Symbicort, pt decided not to take the med Monday night.  Pt's headache went away Monday morning around 11am so pt decided not to take the Symbicort that night and pt did not have another headache.  Pt stated he also did not take the symbicort Tuesday or so far today on Wednesday and from not taking med, pt has been headache free.  Pt states his breathing has been good while taking the Symbicort but does not like the headaches he has had from it.  Dr. Melvyn Novas, please advise on recommendations for pt based on the above.  Thanks!

## 2017-03-11 ENCOUNTER — Ambulatory Visit (INDEPENDENT_AMBULATORY_CARE_PROVIDER_SITE_OTHER)
Admission: RE | Admit: 2017-03-11 | Discharge: 2017-03-11 | Disposition: A | Discharge status: Home / Self Care | Payer: Medicare Other | Source: Ambulatory Visit | Attending: Internal Medicine | Admitting: Internal Medicine

## 2017-03-11 ENCOUNTER — Ambulatory Visit (INDEPENDENT_AMBULATORY_CARE_PROVIDER_SITE_OTHER): Payer: Medicare Other | Admitting: Internal Medicine

## 2017-03-11 ENCOUNTER — Encounter: Payer: Self-pay | Admitting: Internal Medicine

## 2017-03-11 VITALS — BP 140/70 | HR 89 | Ht 70.0 in | Wt 180.0 lb

## 2017-03-11 DIAGNOSIS — J449 Chronic obstructive pulmonary disease, unspecified: Secondary | ICD-10-CM

## 2017-03-11 DIAGNOSIS — J9611 Chronic respiratory failure with hypoxia: Secondary | ICD-10-CM | POA: Diagnosis not present

## 2017-03-11 DIAGNOSIS — J441 Chronic obstructive pulmonary disease with (acute) exacerbation: Secondary | ICD-10-CM | POA: Diagnosis not present

## 2017-03-11 DIAGNOSIS — I10 Essential (primary) hypertension: Secondary | ICD-10-CM | POA: Diagnosis not present

## 2017-03-11 MED ORDER — LOSARTAN POTASSIUM 50 MG PO TABS: ORAL_TABLET | ORAL | 11 refills | Status: DC

## 2017-03-11 NOTE — Patient Instructions (Addendum)
Keep your appointment for March 29 but call sooner if condition worsens   Change your cozar to where you take 50 mg x one pill twice daily for a total of 100mg  daily   Please remember to go to the  x-ray department downstairs in the basement  for your tests - we will call you with the results when they are available.      No change in your medications

## 2017-03-11 NOTE — Progress Notes (Signed)
Subjective:    Patient ID: [name], male   DOB: 1950/03/03    MRN: [id number]    Brief patient profile:  52 yobm quit smoking 2013 with dx of copd on prn inhalers didn't really use it much and "worse over the years" and placed on BREO but worse fall  2018 so placed on Trelegy and referred to pulmonary clinic 01/28/2017 by Dr   [name]      History of Present Illness  01/28/2017 1st [facility] office visit/ [name]  ? Copd   Chief Complaint  Patient presents with  . Pulmonary Consult    Referred by Dr. [name] for eval of COPD. Pt states that he has been having SOB for the past several yrs, worse for the past 3-6 months. He gets SOB with walking short distances and up and down stairs. He has CP that comes and goes. He also has some cough with white sputum.  He is using proair once daily on average.   doe x years much worse x sev months, cp is not related to breathing or exertion localized L ant chest/ lasts just a few secs Cough is worse since sob worse/ esp hs and early in am Baseline = 15 min walk outside mostly flat but struggles if gets in a hurry or steps  Thinks proair helps his cp but note resolves in 2 secs whether uses proair or not rec Continue the dexilant but for now take it Take 30- 60 min before your first and last meals of the day until cough and breathing better then just Take 30-60 min before first meal of the day  Stop Trelegy and lisinopril Losartan 50 mg only daily takes the place of lisinopril  Plan A = Automatic = symbicort 160 Take 2 puffs first thing in am and then another 2 puffs about 12 hours later.  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 Follow-up    4 week ROV, head cold, wants to know what his diagnosis is    Dyspnea:  Limited by [facility] not sob  Cough: some better, coughing returned with "head cold"  min discolored x one week, thinks it's getting better s rx  Sleep: ok   back to one daily dexilant as hs so much better vs baseline = choking sensation  rec No change rx    03/11/2017  f/u ov/[name] re: copd II/ brought meds  Chief Complaint  Patient presents with  . Follow-up    zpack helped with his cough. He occ produces some yellow sputum. He has not had to use his albuterol inhaler. He has some CP on the left that comes and goes.   Dyspnea:  MMRC3 = can't walk 100 yards even at a slow pace at a flat grade s stopping due to sob   Cough: better p rx zpak Sleep: ok  SABA use:  No need for saba L cp  X years comes and goes / better p zpak "it was my nodules" - fleeting and no change with breathing or ex  No obvious day to day or daytime variability or assoc excess/ purulent sputum or mucus plugs or hemoptysis or  chest tightness, subjective wheeze or overt sinus or hb symptoms. No unusual exposure hx or h/o childhood pna/ asthma or knowledge of  premature birth.  Sleeping ok flat without nocturnal  or early am exacerbation  of respiratory  c/o's or need for noct saba. Also denies any obvious fluctuation of symptoms with weather or environmental changes or other aggravating or alleviating factors except as outlined above   Current Allergies, Complete Past Medical History, Past Surgical History, Family History, and Social History were reviewed in [employer] record.  ROS  The following are not active complaints unless bolded Hoarseness, sore throat, dysphagia, dental problems, itching, sneezing,  nasal congestion or discharge of excess mucus or purulent secretions, ear ache,   fever, chills, sweats, unintended wt loss or wt gain, classically pleuritic or exertional cp,  orthopnea pnd or leg swelling, presyncope, palpitations, abdominal pain, anorexia, nausea, vomiting, diarrhea  or change in bowel habits or change in bladder habits, change in stools or change in urine,  dysuria, hematuria,  rash, arthralgias, visual complaints, headache, numbness, weakness or ataxia or problems with walking or coordination,  change in mood/affect or memory.        Current Meds  Medication Sig  . atorvastatin (LIPITOR) 20 MG tablet Take 20 mg by mouth daily.  . budesonide-formoterol (SYMBICORT) 160-4.5 MCG/ACT inhaler Inhale 2 puffs into the lungs 2 (two) times daily.  . celecoxib (CELEBREX) 200 MG capsule Take 1 capsule (200 mg total) by mouth daily. (HOLD X 5 DAYS WHILE TAKING THE IBUPROFEN) (Patient taking differently: Take 200 mg by mouth daily. )  . clopidogrel (PLAVIX) 75 MG tablet Take 1 tablet (75 mg total) by mouth daily.  Marland Kitchen Dexlansoprazole (DEXILANT) 30 MG capsule Take 30- 60 min before your first and last meals of the day (Patient taking differently: Take 30 mg by mouth daily. )  . docusate sodium (COLACE) 100 MG capsule Take 100 mg by mouth daily.  Marland Kitchen glipiZIDE (GLUCOTROL) 10 MG tablet Take 10 mg by mouth daily.   Marland Kitchen HYDROcodone-acetaminophen (NORCO) 7.5-325 MG per tablet Take 1 tablet by mouth 2 (two) times daily as needed for moderate pain.   Marland Kitchen JANUVIA 100 MG tablet Take 100 mg by mouth daily.   Marland Kitchen losartan (COZAAR) 50 MG tablet Take 2 each am  . metFORMIN (GLUCOPHAGE) 500 MG tablet Take 500 mg by mouth 2 (two) times daily with a meal.   . nitroGLYCERIN (NITROSTAT) 0.4 MG SL tablet Place 1 tablet (0.4 mg total) under the tongue every 5 (five) minutes as needed for chest pain.  . ONE TOUCH ULTRA TEST test strip   . PROAIR HFA 108 (90 BASE) MCG/ACT inhaler Inhale 2 puffs into the lungs every 6 (six) hours as needed for wheezing or shortness of breath.   . vitamin C (ASCORBIC ACID) 500 MG tablet Take 500 mg by mouth daily.  . VOLTAREN 1 % GEL Apply 1 application topically daily as needed (pain).   . [DISCONTINUED] losartan (COZAAR) 50 MG tablet Take 1 tablet (50 mg total) by mouth daily.            Objective:   Physical Exam    amb  bm easily confused with  details of care / fails to answer questions in any kind of straightforward fashion.  . 03/11/2017         180   02/13/2017        175   01/28/17 175 lb 12.8 oz (79.7 kg)  01/16/17 175 lb (79.4 kg)  11/01/16 170 lb (77.1 kg)     Vital signs reviewed - Note on arrival 02 sats  96%  on RA         HEENT: nl  turbinates bilaterally, and oropharynx. Nl external ear canals without cough reflex - upper and lower partials   NECK :  without JVD/Nodes/TM/ nl carotid upstrokes bilaterally   LUNGS: no acc muscle use,  Barrel  contour chest wall with bilateral  Distant bs without cough on insp or exp maneuver and hyper resonant   to  percussion bilaterally     CV:  RRR  no s3 or murmur or increase in P2, and no edema   ABD:  soft and nontender with nl inspiratory excursion in the supine position. No bruits or organomegaly appreciated, bowel sounds nl  MS:  Nl gait/ ext warm without deformities, calf tenderness, cyanosis or clubbing No obvious joint restrictions   SKIN: warm and dry without lesions    NEURO:  alert, approp, nl sensorium with  no motor or cerebellar deficits apparent.  .         CXR PA and Lateral:   03/11/2017 :    I personally reviewed images and agree with radiology impression as follows:    No active cardiopulmonary disease.  Heart upper normal    Assessment:

## 2017-03-12 ENCOUNTER — Telehealth: Payer: Self-pay | Admitting: Internal Medicine

## 2017-03-12 NOTE — Telephone Encounter (Signed)
Notes recorded by Tanda Rockers, MD on 03/11/2017 at 5:21 PM EST Call pt: Reviewed cxr and no acute change so no change in recommendations made at [facility]. ---------- Spoke with pt, aware of results/recs.  Nothing further needed.

## 2017-03-12 NOTE — Progress Notes (Signed)
Cardiology Office Note  Date: 03/13/2017   ID: [name], DOB May 18, 1950, MRN [id number]  PCP: [name], MD  Primary Cardiologist: Rozann Lesches, MD   Chief Complaint  Patient presents with  . Coronary Artery Disease    History of Present Illness: [name] is a 67 y.o. male last seen in March 2018.  He presents for a follow-up visit.  From a cardiac perspective, he does not report any angina or nitroglycerin use.  He reports NYHA class II dyspnea with most activities, although does have COPD which flares up from time to time.  He follows with Dr. Melvyn Novas in the Pulmonary clinic.  Recent follow-up chest x-ray is noted below.  He continues to follow with Dr. Bridgett Larsson, underwent drug coated angioplasty of the right common iliac artery recently in January. He has undergone multiple revascularization procedures for PAD.  I reviewed his medications.  He remains on Lipitor, Plavix, Cozaar, and as needed nitroglycerin.  Recent ECG was normal.  Continues to follow with Dr. [name], we are requesting his last lipid panel.  Past Medical History:  Diagnosis Date  . Arthritis    RA  . Bronchitis   . Carotid artery occlusion   . Cervical spine disease    Neck surgery in 2007 with Dr. Joya Salm  . COPD (chronic obstructive pulmonary disease) ([place])   . Coronary atherosclerosis of native coronary artery    Nonobstrtuctive by catheterization 2007 and 2013  . DVT (deep venous thrombosis) ([place])   . Essential hypertension, benign   . GERD (gastroesophageal reflux disease)   . Hyperlipidemia   . PAD (peripheral artery disease) ([place])    s/p left fem-pop bypass 2007; PTA and stenting of right iliac artery 09/2011  . Rheumatoid arthritis(714.0)   . Type 2 diabetes mellitus ([place])     Past Surgical History:  Procedure Laterality Date  . ABDOMINAL AORTAGRAM N/A 10/03/2011   Procedure: ABDOMINAL Maxcine Ham;  Surgeon: Conrad Bethel [place], MD;  Location: [facility] CATH LAB;  Service: Cardiovascular;   Laterality: N/A;  . ABDOMINAL AORTOGRAM W/LOWER EXTREMITY N/A 01/16/2017   Procedure: ABDOMINAL AORTOGRAM W/LOWER EXTREMITY;  Surgeon: Conrad , MD;  Location: [place] CV LAB;  Service: Cardiovascular;  Laterality: N/A;  . CERVICAL DISC SURGERY    . COLONOSCOPY WITH PROPOFOL N/A 02/17/2017   Procedure: COLONOSCOPY WITH PROPOFOL;  Surgeon: Daneil Dolin, MD;  Location: [facility] ENDO SU[facility];  Service: Endoscopy;  Laterality: N/A;  2:45pm  . FEMORAL-POPLITEAL BYPASS GRAFT  11/06/2005  . ILIAC ARTERY STENT  10-03-11   Right CIA stenting  . PERIPHERAL VASCULAR BALLOON ANGIOPLASTY Right 01/16/2017   Procedure: PERIPHERAL VASCULAR BALLOON ANGIOPLASTY;  Surgeon: Conrad , MD;  Location: [place] CV LAB;  Service: Cardiovascular;  Laterality: Right;  common iliac   . PR VEIN BYPASS GRAFT,AORTO-FEM-POP  06-22-2010   Redo Left Fem-Pop  . Repair of left arm fracture    . 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 SURGERY SCROTAL / TESTICULAR      Current Outpatient Medications  Medication Sig Dispense Refill  . atorvastatin (LIPITOR) 20 MG tablet Take 20 mg by mouth daily.    . budesonide-formoterol (SYMBICORT) 160-4.5 MCG/ACT inhaler Inhale 2 puffs into the lungs 2 (two) times daily. 1 Inhaler 11  . celecoxib (CELEBREX) 200 MG capsule Take 1 capsule (200 mg total) by mouth daily. (HOLD X 5 DAYS WHILE TAKING THE IBUPROFEN) (Patient taking differently: Take 200 mg by mouth daily. )    .  clopidogrel (PLAVIX) 75 MG tablet Take 1 tablet (75 mg total) by mouth daily. 30 tablet 11  . Dexlansoprazole (DEXILANT) 30 MG capsule Take 30- 60 min before your first and last meals of the day (Patient taking differently: Take 30 mg by mouth daily. ) 60 capsule 11  . docusate sodium (COLACE) 100 MG capsule Take 100 mg by mouth daily.    Marland Kitchen glipiZIDE (GLUCOTROL) 10 MG tablet Take 10 mg by mouth daily.     Marland Kitchen HYDROcodone-acetaminophen (NORCO) 7.5-325 MG per tablet Take 1 tablet by mouth 2 (two) times daily as needed for  moderate pain.     Marland Kitchen JANUVIA 100 MG tablet Take 100 mg by mouth daily.     Marland Kitchen losartan (COZAAR) 50 MG tablet Take 2 each am 60 tablet 11  . metFORMIN (GLUCOPHAGE) 500 MG tablet Take 500 mg by mouth 2 (two) times daily with a meal.     . nitroGLYCERIN (NITROSTAT) 0.4 MG SL tablet Place 1 tablet (0.4 mg total) under the tongue every 5 (five) minutes as needed for chest pain. 90 tablet 3  . ONE TOUCH ULTRA TEST test strip     . PROAIR HFA 108 (90 BASE) MCG/ACT inhaler Inhale 2 puffs into the lungs every 6 (six) hours as needed for wheezing or shortness of breath.     . vitamin C (ASCORBIC ACID) 500 MG tablet Take 500 mg by mouth daily.    . VOLTAREN 1 % GEL Apply 1 application topically daily as needed (pain).      No current facility-administered medications for this visit.    Allergies:  Patient has no known allergies.   Social History: The patient  reports that he quit smoking about 5 years ago. His smoking use included cigarettes and cigars. He has a 45.00 pack-year smoking history. he has never used smokeless tobacco. He reports that he does not drink alcohol or use drugs.   ROS:  Please see the history of present illness. Otherwise, complete review of systems is positive for chronic claudication, intermittent wheezing.  All other systems are reviewed and negative.   Physical Exam: VS:  BP 122/88   Pulse 64   Ht 5\' 10"  (1.778 m)   Wt 185 lb (83.9 kg)   SpO2 98%   BMI 26.54 kg/m , BMI Body mass index is 26.54 kg/m.  Wt Readings from Last 3 Encounters:  03/13/17 185 lb (83.9 kg)  03/11/17 180 lb (81.6 kg)  02/14/17 180 lb (81.6 kg)    General: Patient appears comfortable at rest. HEENT: Conjunctiva and lids normal, oropharynx clear. Neck: Supple, no elevated JVP or carotid bruits, no thyromegaly. Lungs: No wheezing or crackles, nonlabored breathing at rest. Cardiac: Regular rate and rhythm, S4, no significant systolic murmur. Abdomen: Soft, nontender, bowel sounds  present. Extremities: No pitting edema, distal pulses diminished. Skin: Warm and dry. Musculoskeletal: No kyphosis. Neuropsychiatric: Alert and oriented x3, affect grossly appropriate.  ECG: I personally reviewed the tracing from 01/16/2017 which showed sinus rhythm.  Recent Labwork: 01/16/2017: BUN 13; Creatinine, Ser 0.70; Hemoglobin 13.3; Potassium 4.1; Sodium 142   Other Studies Reviewed Today:  Echocardiogram 11/24/2014: Study Conclusions  - Left ventricle: The cavity size was normal. Wall thickness was  normal. Systolic function was normal. The estimated ejection  fraction was in the range of 60% to 65%. Doppler parameters are  consistent with abnormal left ventricular relaxation (grade 1  diastolic dysfunction). - Aortic valve: Mildly calcified annulus. Trileaflet; mildly  thickened leaflets. There was  mild regurgitation. The AI VC is  0.3 cm. Valve area (VTI): 2.18 cm^2. Valve area (Vmax): 1.73  cm^2. Valve area (Vmean): 2.35 cm^2. Regurgitation pressure  half-time: 546 ms. - Mitral valve: Mildly calcified annulus. Mildly thickened leaflets. - Atrial septum: No defect or patent foramen ovale was identified. - Technically difficult study.  Chest x-ray 03/11/2017: FINDINGS: No active infiltrate or effusion is seen. The lungs are slightly hyperaerated. Mediastinal and hilar contours are unremarkable. The heart is mildly enlarged. No acute bony abnormality is seen. There is degenerative change in the right shoulder noted  IMPRESSION: No active cardiopulmonary disease.  Heart upper normal.  Assessment and Plan:  1.  History of nonobstructive CAD, no active angina symptoms or nitroglycerin use.  Recent ECG normal.  Continue antiplatelet regimen and statin therapy.  2.  Severe PAD, recent revascularization noted by Dr. Bridgett Larsson.  He has chronic claudication.  3.  Mixed hyperlipidemia, continues on Lipitor.  Requesting recent lipid panel from Dr. [name].  4.  COPD,  keep follow-up with Dr. Melvyn Novas.  History of tobacco abuse in remission.  Current medicines were reviewed with the patient today.  Disposition: Follow-up up in 1 year.  Signed, Satira Sark, MD, [facility] 03/13/2017 9:49 AM    [facility] [place], [place], [place] [address] Phone: [phone]; Fax: [phone]

## 2017-03-13 ENCOUNTER — Ambulatory Visit (INDEPENDENT_AMBULATORY_CARE_PROVIDER_SITE_OTHER): Payer: Medicare Other | Admitting: Cardiology

## 2017-03-13 ENCOUNTER — Encounter: Payer: Self-pay | Admitting: Cardiology

## 2017-03-13 ENCOUNTER — Encounter: Payer: Self-pay | Admitting: Internal Medicine

## 2017-03-13 VITALS — BP 122/88 | HR 64 | Ht 70.0 in | Wt 185.0 lb

## 2017-03-13 DIAGNOSIS — I2510 Atherosclerotic heart disease of native coronary artery without angina pectoris: Secondary | ICD-10-CM | POA: Diagnosis not present

## 2017-03-13 DIAGNOSIS — J449 Chronic obstructive pulmonary disease, unspecified: Secondary | ICD-10-CM | POA: Diagnosis not present

## 2017-03-13 DIAGNOSIS — J441 Chronic obstructive pulmonary disease with (acute) exacerbation: Secondary | ICD-10-CM | POA: Insufficient documentation

## 2017-03-13 DIAGNOSIS — E782 Mixed hyperlipidemia: Secondary | ICD-10-CM | POA: Diagnosis not present

## 2017-03-13 DIAGNOSIS — I739 Peripheral vascular disease, unspecified: Secondary | ICD-10-CM | POA: Diagnosis not present

## 2017-03-13 NOTE — Assessment & Plan Note (Addendum)
Quit smoking 2012 PFT's 01/04/15  FEV1 2.15 (71 % ) ratio 58  p 14 % improvement from saba p ? prior to study with DLCO  70 % corrects to 94  % for alv volume  With only mild curvature f/v loop  - Spirometry 01/28/2017  FEV1 1.59 (53%)  Ratio 59 off all rx  - 01/28/2017    symb 160 2bid and d/c acei and double the dexilant > marked improvement 02/13/2017    03/11/2017  After extensive coaching inhaler device  effectiveness =    75% (short Ti)  S/p  exac back to baseline x for fleeting atypical L cp either mscp or related to ibs/splenic flexure as never comes on supine    rec : no change rx   Each maintenance medication was reviewed in detail including most importantly the difference between maintenance and as needed and under what circumstances the prns are to be used.  Please see AVS for specific  Instructions which are unique to this visit and I personally typed out  which were reviewed in detail in writing with the patient and a copy provided.

## 2017-03-13 NOTE — Assessment & Plan Note (Signed)
Back to baseline p z pak, no change maint rx   Keep regular f/u but call sooner if needed

## 2017-03-13 NOTE — Assessment & Plan Note (Addendum)
Not ideally controlled per pt report > change to cozar 50 mg 2 qam until returns then modify to 100 mg daily   Discussed what to look for for ischemic related cp but pt insisted this is a pain from lung nodules despite absence of pleuritic features and nl cxr.   > 50 % of visit spent on counseling

## 2017-03-13 NOTE — Patient Instructions (Signed)
Medication Instructions:  Your physician recommends that you continue on your current medications as directed. Please refer to the Current Medication list given to you today.  Labwork: none  Testing/Procedures: none  Follow-Up: Your physician wants you to follow-up in: [place]. You will receive a reminder letter in the mail two months in advance. If you don't receive a letter, please call our office to schedule the follow-up appointment.  Any Other Special Instructions Will Be Listed Below (If Applicable).  If you need a refill on your cardiac medications before your next appointment, please call your pharmacy.

## 2017-03-17 ENCOUNTER — Telehealth: Payer: Self-pay

## 2017-03-17 NOTE — Telephone Encounter (Signed)
Fraser Din at [facility] called office. Dr. [name] requested TCS report. TCS report faxed to Dr. [name].

## 2017-04-04 ENCOUNTER — Ambulatory Visit (INDEPENDENT_AMBULATORY_CARE_PROVIDER_SITE_OTHER): Payer: Medicare Other | Admitting: Internal Medicine

## 2017-04-04 ENCOUNTER — Encounter: Payer: Self-pay | Admitting: Internal Medicine

## 2017-04-04 VITALS — BP 140/80 | HR 78 | Ht 69.5 in | Wt 179.0 lb

## 2017-04-04 DIAGNOSIS — J449 Chronic obstructive pulmonary disease, unspecified: Secondary | ICD-10-CM

## 2017-04-04 DIAGNOSIS — I10 Essential (primary) hypertension: Secondary | ICD-10-CM | POA: Diagnosis not present

## 2017-04-04 DIAGNOSIS — R0789 Other chest pain: Secondary | ICD-10-CM

## 2017-04-04 LAB — PULMONARY FUNCTION TEST
DL/VA % pred: 82 %
DL/VA: 3.76 ml/min/mmHg/L
DLCO unc % pred: 63 %
DLCO unc: 19.95 ml/min/mmHg
FEF 25-75 Post: 1.39 L/sec
FEF 25-75 Pre: 0.77 L/sec
FEF2575-%Change-Post: 81 %
FEF2575-%Pred-Post: 53 %
FEF2575-%Pred-Pre: 29 %
FEV1-%Change-Post: 23 %
FEV1-%Pred-Post: 68 %
FEV1-%Pred-Pre: 55 %
FEV1-Post: 1.99 L
FEV1-Pre: 1.61 L
FEV1FVC-%Change-Post: 6 %
FEV1FVC-%Pred-Pre: 76 %
FEV6-%Change-Post: 16 %
FEV6-%Pred-Post: 84 %
FEV6-%Pred-Pre: 72 %
FEV6-Post: 3.1 L
FEV6-Pre: 2.67 L
FEV6FVC-%Change-Post: 0 %
FEV6FVC-%Pred-Post: 101 %
FEV6FVC-%Pred-Pre: 101 %
FVC-%Change-Post: 15 %
FVC-%Pred-Post: 83 %
FVC-%Pred-Pre: 71 %
FVC-Post: 3.19 L
FVC-Pre: 2.76 L
Post FEV1/FVC ratio: 62 %
Post FEV6/FVC ratio: 97 %
Pre FEV1/FVC ratio: 59 %
Pre FEV6/FVC Ratio: 97 %
RV % pred: 175 %
RV: 4.12 L
TLC % pred: 101 %
TLC: 7.05 L

## 2017-04-04 NOTE — Progress Notes (Signed)
Subjective:    Patient ID: [name], male   DOB: Jul 13, 1950    MRN: [id number]    Brief patient profile:  50 yobm quit smoking 2013 with dx of copd on prn inhalers didn't really use it much and "worse over the years" and placed on BREO but worse fall  2018 so placed on Trelegy and referred to pulmonary clinic 01/28/2017 by Dr   [name]      History of Present Illness  01/28/2017 1st [facility] office visit/ Mililani Murthy  ? Copd   Chief Complaint  Patient presents with  . Pulmonary Consult    Referred by Dr. [name] for eval of COPD. Pt states that he has been having SOB for the past several yrs, worse for the past 3-6 months. He gets SOB with walking short distances and up and down stairs. He has CP that comes and goes. He also has some cough with white sputum.  He is using proair once daily on average.   doe x years much worse x sev months, cp is not related to breathing or exertion localized L ant chest/ lasts just a few secs Cough is worse since sob worse/ esp hs and early in am Baseline = 15 min walk outside mostly flat but struggles if gets in a hurry or steps  Thinks proair helps his cp but note resolves in 2 secs whether uses proair or not rec Continue the dexilant but for now take it Take 30- 60 min before your first and last meals of the day until cough and breathing better then just Take 30-60 min before first meal of the day  Stop Trelegy and lisinopril Losartan 50 mg only daily takes the place of lisinopril  Plan A = Automatic = symbicort 160 Take 2 puffs first thing in am and then another 2 puffs about 12 hours later.  Work on inhaler technique:   Plan B = Backup Only use your albuterol as a rescue medication        03/11/2017  f/u ov/[name] re: copd II/ brought meds  Chief Complaint  Patient presents with  . Follow-up    zpack helped with his cough. He occ produces some yellow sputum. He has not had to use his albuterol inhaler. He has some CP on the left that comes and  goes.   Dyspnea:  MMRC3 = can't walk 100 yards even at a slow pace at a flat grade s stopping due to sob   Cough: better p rx zpak Sleep: ok  SABA use:  No need for saba L cp  X years comes and goes / better p zpak "it was my nodules" - fleeting and no change with breathing or ex rec Keep your appointment for March 29 but call sooner if condition worsens  Change your cozar to where you take 50 mg x one pill twice daily for a total of 100mg  daily    04/04/2017  f/u ov/[name] re:  Copd GOLD II with marked reversibility on symb 160 2bid  Chief Complaint  Patient presents with  . Follow-up    Feels good but has chest pain that comes and goes.  Dyspnea:  MMRC2 = can't walk a nl pace on a flat grade s sob but does fine slow and flat eg Cough: no cough Sleep: most noct fine  SABA use:  Never  Cp same location as previous parasternal L upper lasts x no more than one-two secs 2-3 times a day returned Jan 2019 vs  many years prior to OV same pattern attributed to "lung nodules'    No obvious day to day or daytime variability or assoc excess/ purulent sputum or mucus plugs or hemoptysis or   chest tightness, subjective wheeze or overt sinus or hb symptoms. No unusual exposure hx or h/o childhood pna/ asthma or knowledge of premature birth.  Sleeping ok flat without nocturnal  or early am exacerbation  of respiratory  c/o's or need for noct saba. Also denies any obvious fluctuation of symptoms with weather or environmental changes or other aggravating or alleviating factors except as outlined above   Current Allergies, Complete Past Medical History, Past Surgical History, Family History, and Social History were reviewed in [employer] record.  ROS  The following are not active complaints unless bolded Hoarseness, sore throat, dysphagia, dental problems, itching, sneezing,  nasal congestion or discharge of excess mucus or purulent secretions, ear ache,   fever, chills, sweats,  unintended wt loss or wt gain, classically pleuritic or exertional cp,  orthopnea pnd or leg swelling, presyncope, palpitations, abdominal pain, anorexia, nausea, vomiting, diarrhea  or change in bowel habits or change in bladder habits, change in stools or change in urine, dysuria, hematuria,  rash, arthralgias, visual complaints, headache, numbness, weakness or ataxia or problems with walking or coordination,  change in mood/affect or memory.        Current Meds  Medication Sig  . atorvastatin (LIPITOR) 20 MG tablet Take 20 mg by mouth daily.  . budesonide-formoterol (SYMBICORT) 160-4.5 MCG/ACT inhaler Inhale 2 puffs into the lungs 2 (two) times daily.  . celecoxib (CELEBREX) 200 MG capsule Take 1 capsule (200 mg total) by mouth daily. (HOLD X 5 DAYS WHILE TAKING THE IBUPROFEN) (Patient taking differently: Take 200 mg by mouth daily. )  . clopidogrel (PLAVIX) 75 MG tablet Take 1 tablet (75 mg total) by mouth daily.  Marland Kitchen Dexlansoprazole (DEXILANT) 30 MG capsule Take 30- 60 min before your first and last meals of the day (Patient taking differently: Take 30 mg by mouth daily. )  . docusate sodium (COLACE) 100 MG capsule Take 100 mg by mouth daily.  Marland Kitchen glipiZIDE (GLUCOTROL) 10 MG tablet Take 10 mg by mouth daily.   Marland Kitchen HYDROcodone-acetaminophen (NORCO) 7.5-325 MG per tablet Take 1 tablet by mouth 2 (two) times daily as needed for moderate pain.   Marland Kitchen JANUVIA 100 MG tablet Take 100 mg by mouth daily.   Marland Kitchen losartan (COZAAR) 50 MG tablet Take 2 each am  . metFORMIN (GLUCOPHAGE) 500 MG tablet Take 500 mg by mouth 2 (two) times daily with a meal.   . nitroGLYCERIN (NITROSTAT) 0.4 MG SL tablet Place 1 tablet (0.4 mg total) under the tongue every 5 (five) minutes as needed for chest pain.  . ONE TOUCH ULTRA TEST test strip   . PROAIR HFA 108 (90 BASE) MCG/ACT inhaler Inhale 2 puffs into the lungs every 6 (six) hours as needed for wheezing or shortness of breath.   . vitamin C (ASCORBIC ACID) 500 MG tablet Take  500 mg by mouth daily.  . VOLTAREN 1 % GEL Apply 1 application topically daily as needed (pain).                 Objective:   Physical Exam     amb bm nad   04/04/2017       179  03/11/2017         180   02/13/2017  175   01/28/17 175 lb 12.8 oz (79.7 kg)  01/16/17 175 lb (79.4 kg)  11/01/16 170 lb (77.1 kg)      Vital signs reviewed - Note on arrival 02 sats  97% on RA   And bp 140/80 s am meds       HEENT: nl  oropharynx. Nl external ear canals without cough reflex - mild bilateral non-specific turbinate edema  And upper and lower partial dentures    NECK :  without JVD/Nodes/TM/ nl carotid upstrokes bilaterally   LUNGS: no acc muscle use,  Mod barrel  contour chest wall with bilateral  Distant bs s audible wheeze and  without cough on insp or exp maneuver and mod   Hyperresonant  to  percussion bilaterally     CV:  RRR  no s3 or murmur or increase in P2, and no edema   ABD:  soft and nontender with pos mid insp Hoover's  in the supine position. No bruits or organomegaly appreciated, bowel sounds nl  MS:   Nl gait/  ext warm without deformities, calf tenderness, cyanosis or clubbing No obvious joint restrictions   SKIN: warm and dry without lesions    NEURO:  alert, approp, nl sensorium with  no motor or cerebellar deficits apparent.                 Assessment:

## 2017-04-04 NOTE — Progress Notes (Signed)
PFT done today. 

## 2017-04-04 NOTE — Patient Instructions (Signed)
Plan A = Automatic = Symbicort 160 Take 2 puffs first thing in am and then another 2 puffs about 12 hours later.   Work on inhaler technique:  relax and gently blow all the way out then take a nice smooth deep breath back in, triggering the inhaler at same time you start breathing in.  Hold for up to 5 seconds if you can. Blow out thru nose. Rinse and gargle with water when done     Plan B = Backup Only use your albuterol as a rescue medication to be used if you can't catch your breath by resting or doing a relaxed purse lip breathing pattern.  - The less you use it, the better it will work when you need it. - Ok to use the inhaler up to 2 puffs  every 4 hours if you must but call for appointment if use goes up over your usual need - Don't leave home without it !!  (think of it like the spare tire for your car)     If you are satisfied with your treatment plan,  let your doctor know and he/she can either refill your medications or you can return here when your prescription runs out.     If in any way you are not 100% satisfied,  please tell us.  If 100% better, tell your friends!  Pulmonary follow up is as needed

## 2017-04-05 ENCOUNTER — Encounter: Payer: Self-pay | Admitting: Internal Medicine

## 2017-04-05 DIAGNOSIS — R0789 Other chest pain: Secondary | ICD-10-CM | POA: Insufficient documentation

## 2017-04-05 DIAGNOSIS — R079 Chest pain, unspecified: Secondary | ICD-10-CM | POA: Insufficient documentation

## 2017-04-05 NOTE — Assessment & Plan Note (Addendum)
Quit smoking 2012 PFT's 01/04/15  FEV1 2.15 (71 % ) ratio 58  p 14 % improvement from saba p ? 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  Please see AVS for specific  Instructions which are unique to this visit and I personally typed out  which were reviewed in detail in writing with the patient and a copy provided.

## 2017-04-05 NOTE — Assessment & Plan Note (Signed)
Adequate control on present rx, reviewed in detail with pt > no change in rx needed  > Follow up per Primary Care planned   

## 2017-04-05 NOTE — Assessment & Plan Note (Signed)
Very fleeting not at all typical of a pulmonary or cardiac etiology, recurrent x years so likely benign/ reassured and reviewed how to tell if the pain evolves into more of a pulmonary or cardiac pattern over time

## 2017-05-02 ENCOUNTER — Ambulatory Visit (INDEPENDENT_AMBULATORY_CARE_PROVIDER_SITE_OTHER): Payer: Medicare Other | Admitting: Podiatry

## 2017-05-02 ENCOUNTER — Encounter: Payer: Self-pay | Admitting: Podiatry

## 2017-05-02 VITALS — BP 182/89 | HR 94

## 2017-05-02 DIAGNOSIS — M79674 Pain in right toe(s): Secondary | ICD-10-CM | POA: Diagnosis not present

## 2017-05-02 DIAGNOSIS — M79675 Pain in left toe(s): Secondary | ICD-10-CM

## 2017-05-02 DIAGNOSIS — D689 Coagulation defect, unspecified: Secondary | ICD-10-CM | POA: Diagnosis not present

## 2017-05-02 DIAGNOSIS — B351 Tinea unguium: Secondary | ICD-10-CM | POA: Diagnosis not present

## 2017-05-02 DIAGNOSIS — E1159 Type 2 diabetes mellitus with other circulatory complications: Secondary | ICD-10-CM | POA: Diagnosis not present

## 2017-05-02 NOTE — Progress Notes (Signed)
This patient presents to the office with chief complaint of long thick nails and diabetic feet.  This patient  says he is having no pain and discomfort in his feet.  This patient says he has long thick painful nails.  These nails are painful walking and wearing shoes.  He has no history of infection or drainage from both feet.  This patient presents the office today for treatment of the  long nails and a foot evaluation due to history of  Diabetes. He has history of vascular surgery both legs. Patient is taking plavix.   General Appearance  Alert, conversant and in no acute stress.  Vascular  Dorsalis pedis and posterior tibial  pulses are not  palpable  Left foot.  DP is palpable right foot.  PT is non palpable right foot.  Capillary return is within normal limits  bilaterally. Temperature is within normal limits  Bilaterally. Venous stasis legs  B/L.  Neurologic  Senn-Weinstein monofilament wire test within normal limits  bilaterally. Muscle power within normal limits bilaterally.  Nails Thick disfigured discolored nails with subungual debris  from hallux to fifth toes bilaterally. No evidence of bacterial infection or drainage bilaterally.  Orthopedic  No limitations of motion of motion feet .  No crepitus or effusions noted.  HAV  B/L.  Skin  normotropic skin with no porokeratosis noted bilaterally.  No signs of infections or ulcers noted.     Onychomycosis  Diabetes with no foot complications  IE  Debride nails x 10.  A diabetic foot exam was performed and there is no evidence of   neurologic pathology.  His foot exam reveals concern for his vascular status.  He is to see his vascular doctor in 1 month.    RTC 3 months.for preventative foot care services.   Gardiner Barefoot DPM

## 2017-05-03 ENCOUNTER — Emergency Department (HOSPITAL_COMMUNITY)
Admission: EM | Admit: 2017-05-03 | Discharge: 2017-05-03 | Disposition: A | Discharge status: Home / Self Care | Payer: Medicare Other | Attending: Emergency Medicine | Admitting: Emergency Medicine

## 2017-05-03 ENCOUNTER — Encounter (HOSPITAL_COMMUNITY): Payer: Self-pay

## 2017-05-03 DIAGNOSIS — Z87891 Personal history of nicotine dependence: Secondary | ICD-10-CM | POA: Diagnosis not present

## 2017-05-03 DIAGNOSIS — M7989 Other specified soft tissue disorders: Secondary | ICD-10-CM

## 2017-05-03 DIAGNOSIS — E119 Type 2 diabetes mellitus without complications: Secondary | ICD-10-CM | POA: Insufficient documentation

## 2017-05-03 DIAGNOSIS — Z7902 Long term (current) use of antithrombotics/antiplatelets: Secondary | ICD-10-CM | POA: Insufficient documentation

## 2017-05-03 DIAGNOSIS — I10 Essential (primary) hypertension: Secondary | ICD-10-CM | POA: Insufficient documentation

## 2017-05-03 DIAGNOSIS — J449 Chronic obstructive pulmonary disease, unspecified: Secondary | ICD-10-CM | POA: Insufficient documentation

## 2017-05-03 DIAGNOSIS — R209 Unspecified disturbances of skin sensation: Secondary | ICD-10-CM | POA: Diagnosis present

## 2017-05-03 DIAGNOSIS — I2510 Atherosclerotic heart disease of native coronary artery without angina pectoris: Secondary | ICD-10-CM | POA: Insufficient documentation

## 2017-05-03 DIAGNOSIS — Z79899 Other long term (current) drug therapy: Secondary | ICD-10-CM | POA: Diagnosis not present

## 2017-05-03 DIAGNOSIS — Z7984 Long term (current) use of oral hypoglycemic drugs: Secondary | ICD-10-CM | POA: Insufficient documentation

## 2017-05-03 NOTE — ED Provider Notes (Signed)
[facility] EMERGENCY DEPARTMENT Provider Note   CSN: [id number] Arrival date & time: 05/03/17  1615     History   Chief Complaint Chief Complaint  Patient presents with  . Leg Pain    HPI [name] is a 66 y.o. male.  Patient saw podiatrist yesterday and was concerned with not being able to feel the pulse of the left foot.  Patient has history of multiple vascular surgeries in the left lower extremity.  Patient called his vascular surgeon today and told to come for evaluation as concern for pulseless foot.  The history is provided by the patient.  Illness  This is a chronic problem. The current episode started more than 2 days ago. The problem occurs daily. The problem has not changed since onset.Pertinent negatives include no chest pain, no abdominal pain, no headaches and no shortness of breath. Nothing aggravates the symptoms. He has tried nothing for the symptoms. The treatment provided no relief.    Past Medical History:  Diagnosis Date  . Arthritis    RA  . Bronchitis   . Carotid artery occlusion   . Cervical spine disease    Neck surgery in 2007 with Dr. Joya Salm  . COPD (chronic obstructive pulmonary disease) ([place])   . Coronary atherosclerosis of native coronary artery    Nonobstrtuctive by catheterization 2007 and 2013  . DVT (deep venous thrombosis) ([place])   . Essential hypertension, benign   . GERD (gastroesophageal reflux disease)   . Hyperlipidemia   . PAD (peripheral artery disease) ([place])    s/p left fem-pop bypass 2007; PTA and stenting of right iliac artery 09/2011  . Rheumatoid arthritis(714.0)   . Type 2 diabetes mellitus [facility])     Patient Active Problem List   Diagnosis Date Noted  . Chest pain, musculoskeletal 04/05/2017  . COPD with acute exacerbation ([place]) 03/13/2017  . Iliac artery stenosis, right ([place]) 02/12/2017  . Hematochezia 02/10/2017  . Abnormal CT of the abdomen 02/10/2017  . Pain in joint, lower leg 03/02/2014    . Discoloration of skin-Left dorsum foot 09/28/2013  . Swelling of limb-Left Calf / Leg 09/28/2013  . PVD (peripheral vascular disease) ([place]) 10/14/2012  . Pain in limb- Left popliteal and calf 10/14/2012  . Hyperlipidemia   . Essential hypertension, benign 03/05/2012  . 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 History of arterial bypass of lower limb 12/25/2010  . Atherosclerosis of native arteries of extremity with intermittent claudication ([place]) 12/25/2010  . Coronary atherosclerosis of native coronary artery 04/16/2008  . PERIPHERAL VASCULAR DISEASE 04/16/2008  . COPD  GOLD II 04/16/2008  . GASTROESOPHAGEAL REFLUX DISEASE 04/16/2008  . RHEUMATOID ARTHRITIS 04/16/2008  . DIABETES MELLITUS, BORDERLINE 04/16/2008    Past Surgical History:  Procedure Laterality Date  . ABDOMINAL AORTAGRAM N/A 10/03/2011   Procedure: ABDOMINAL Maxcine Ham;  Surgeon: Conrad Vidalia, MD;  Location[facility] CATH LAB;  Service: Cardiovascular;  Laterality: N/A;  . ABDOMINAL AORTOGRAM W/LOWER EXTREMITY N/A 01/16/2017   Procedure: ABDOMINAL AORTOGRAM W/LOWER EXTREMITY;  Surgeon: Conrad [place], MD;  Location: [facility] CV LAB;  Service: Cardiovascular;  Laterality: N/A;  . CERVICAL DISC SURGERY    . COLONOSCOPY WITH PROPOFOL N/A 02/17/2017   Procedure: COLONOSCOPY WITH PROPOFOL;  Surgeon: Daneil Dolin, MD;  Location: [facility] ENDO SUITE;  Service: Endoscopy;  Laterality: N/A;  2:45pm  . FEMORAL-POPLITEAL BYPASS GRAFT  11/06/2005  . ILIAC ARTERY STENT  10-03-11   Right [facility] stenting  . PERIPHERAL VASCULAR BALLOON ANGIOPLASTY Right  01/16/2017   Procedure: PERIPHERAL VASCULAR BALLOON ANGIOPLASTY;  Surgeon: Conrad Weldon, MD;  Location: [facility] CV LAB;  Service: Cardiovascular;  Laterality: Right;  common iliac   . PR VEIN BYPASS GRAFT,AORTO-FEM-POP  06-22-2010   Redo Left Fem-Pop  . Repair of left arm fracture    . 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 SURGERY SCROTAL / TESTICULAR          Home  Medications    Prior to Admission medications   Medication Sig Start Date End Date Taking? Authorizing Provider  atorvastatin (LIPITOR) 20 MG tablet Take 20 mg by mouth daily.    [provider]  budesonide-formoterol (SYMBICORT) 160-4.5 MCG/ACT inhaler Inhale 2 puffs into the lungs 2 (two) times daily. 02/05/17   Tanda Rockers, MD  celecoxib (CELEBREX) 200 MG capsule Take 1 capsule (200 mg total) by mouth daily. (HOLD X 5 DAYS WHILE TAKING THE IBUPROFEN) Patient taking differently: Take 200 mg by mouth daily.  08/10/13   Arnoldo Lenis, MD  clopidogrel (PLAVIX) 75 MG tablet Take 1 tablet (75 mg total) by mouth daily. 04/17/16   Conrad Ranson, MD  Dexlansoprazole (DEXILANT) 30 MG capsule Take 30- 60 min before your first and last meals of the day Patient taking differently: Take 30 mg by mouth daily.  01/28/17   Tanda Rockers, MD  docusate sodium (COLACE) 100 MG capsule Take 100 mg by mouth daily.    [provider]  glipiZIDE (GLUCOTROL) 10 MG tablet Take 10 mg by mouth daily.     [provider]  HYDROcodone-acetaminophen (NORCO) 7.5-325 MG per tablet Take 1 tablet by mouth 2 (two) times daily as needed for moderate pain.  06/24/13   [provider]  JANUVIA 100 MG tablet Take 100 mg by mouth daily.  07/26/13   [provider]  losartan (COZAAR) 50 MG tablet Take 2 each am 03/11/17   Tanda Rockers, MD  metFORMIN (GLUCOPHAGE) 500 MG tablet Take 500 mg by mouth 2 (two) times daily with a meal.     [provider]  nitroGLYCERIN (NITROSTAT) 0.4 MG SL tablet Place 1 tablet (0.4 mg total) under the tongue every 5 (five) minutes as needed for chest pain. 07/02/12   Serpe, Burna Forts, PA-C  ONE TOUCH ULTRA TEST test strip  08/13/13   [provider]  PROAIR HFA 108 (90 BASE) MCG/ACT inhaler Inhale 2 puffs into the lungs every 6 (six) hours as needed for wheezing or shortness of breath.  07/12/12   [provider]  vitamin C (ASCORBIC  ACID) 500 MG tablet Take 500 mg by mouth daily.    [provider]  VOLTAREN 1 % GEL Apply 1 application topically daily as needed (pain).  07/20/13   [provider]    Family History Family History  Problem Relation Age of Onset  . Heart attack Mother 53  . Deep vein thrombosis Mother   . Diabetes Mother   . Hyperlipidemia Mother   . Hypertension Mother   . Peripheral vascular disease Mother   . Stroke Father   . Diabetes Father   . Hyperlipidemia Father   . Hypertension Father   . Peripheral vascular disease Father   . Diabetes Sister   . Hyperlipidemia Sister   . Hypertension Sister   . Diabetes Brother   . Hyperlipidemia Brother   . Cancer Brother   . Deep vein thrombosis Brother   . Hypertension Brother   .  Peripheral vascular disease Brother   . Hyperlipidemia Son   . Cancer Son   . Hypertension Son   . Colon cancer Neg Hx     Social History Social History   Tobacco Use  . Smoking status: Former Smoker    Packs/day: 1.00    Years: 45.00    Pack years: 45.00    Types: Cigarettes, Cigars    Last attempt to quit: 09/01/2011    Years since quitting: 5.6  . Smokeless tobacco: Never Used  Substance Use Topics  . Alcohol use: No    Alcohol/week: 0.0 oz    Frequency: Never  . Drug use: No     Allergies   Patient has no known allergies.   Review of Systems Review of Systems  Constitutional: Negative for chills and fever.  HENT: Negative for ear pain and sore throat.   Eyes: Negative for pain and visual disturbance.  Respiratory: Negative for cough and shortness of breath.   Cardiovascular: Negative for chest pain and palpitations.  Gastrointestinal: Negative for abdominal pain and vomiting.  Genitourinary: Negative for dysuria and hematuria.  Musculoskeletal: Negative for arthralgias and back pain.  Skin: Negative for color change and rash.  Neurological: Negative for seizures, syncope and headaches.  All other systems reviewed and  are negative.    Physical Exam Updated Vital Signs  ED Triage Vitals  Enc Vitals Group     BP 05/03/17 1639 (!) 160/92     Pulse Rate 05/03/17 1639 94     Resp 05/03/17 1639 16     Temp 05/03/17 1639 98.3 F (36.8 C)     Temp Source 05/03/17 1639 Oral     SpO2 05/03/17 1639 97 %     Weight --      Height --      Head Circumference --      Peak Flow --      Pain Score 05/03/17 1648 6     Pain Loc --      Pain Edu? --      Excl. in [place]? --     Physical Exam  Constitutional: He is oriented to person, place, and time. He appears well-developed and well-nourished.  HENT:  Head: Normocephalic and atraumatic.  Eyes: Pupils are equal, round, and reactive to light. EOM are normal.  Neck: Normal range of motion. Neck supple.  Cardiovascular: Normal rate, regular rhythm, normal heart sounds and intact distal pulses.  No murmur heard. Patient with 1+ DP and PT pulse in the left foot and normal pulses in the right foot.  Also had good Doppler pulses to left foot.  Pulmonary/Chest: Effort normal and breath sounds normal. No respiratory distress.  Abdominal: Soft. There is no tenderness.  Musculoskeletal: He exhibits no edema.  Neurological: He is alert and oriented to person, place, and time.  Skin: Skin is warm and dry.  Psychiatric: He has a normal mood and affect.  Nursing note and vitals reviewed.    ED Treatments / Results  Labs (all labs ordered are listed, but only abnormal results are displayed) Labs Reviewed - No data to display  EKG None  Radiology No results found.  Procedures Procedures (including critical care time)  Medications Ordered in ED Medications - No data to display   Initial Impression / Assessment and Plan / ED Course  I have reviewed the triage vital signs and the nursing notes.  Pertinent labs & imaging results that were available during my care of the patient were  reviewed by me and considered in my medical decision making (see chart for  details).     Kristen Bushway is a 67 year old male with history of high cholesterol, diabetes, peripheral vascular disease in the left lower extremity who presents to the ED with concern for pulselesss left foot.  Patient with unremarkable vitals.  No fever.  Patient saw podiatry yesterday for diabetic wound care and they were concerned because they cannot feel a pulse in his left foot.  Patient called his vascular surgeon today as he has had several bypass surgeries to the left lower extremity and they were told to come for evaluation.  Patient has easily heard Doppler pulses in the left lower foot in the DP and PT.  I can feel about a 1+ pulse with my finger as well on the DP.  Patient has strong pulses in the right lower extremity.  There is no swelling about the ankle and foot.  No concern for DVT, infection.  Patient is compliant with his medications.  Patient was given reassurance.  Contacted Dr. Bridgett Larsson, his vascular surgeon, and discussed the patient with him.  He already has follow-up in place and recommends that patient just continue taking his medications and to follow-up at that appointment.  Patient was discharged in good condition and told to return to the ED if symptoms worsen.  Final Clinical Impressions(s) / ED Diagnoses   Final diagnoses:  Swelling of left foot    ED Discharge Orders    None       Lennice Sites, DO 05/03/17 2109    Carmin Muskrat, MD 05/04/17 1538

## 2017-05-03 NOTE — ED Triage Notes (Signed)
Pt states he was seen at podiatrist yesterday and physician was unable to find pulse in left foot. Vascular dr told him to come to ED for further evaluation today. Pulse found with doppler. PT reports pain and swelling to left foot. Pt foot warm. Color good.

## 2017-05-04 ENCOUNTER — Telehealth: Payer: Self-pay | Admitting: Vascular Surgery

## 2017-05-04 NOTE — Telephone Encounter (Signed)
   Daily Progress Note   Pt called yesterday, 04/23/17 2 PM, with concerns of L leg swelling and recent podiatry visit where there was no pulses in his left foot.  He denies any neurologic symptoms and was ambulating like normal.  I told the patient I thought his bypass was likely down, so any swelling was concerning for possible DVT.   Patient elected to go to ED for evaluation for DVT  He already has scheduled follow up with me.  As his L CFA to BK pop BPG is a redo bypass of unknown duration of occlusion, he is not a candidate for thrombolytic therapy.   Adele Barthel, MD, FACS [facility] Office: [phone] Pager: [phone]  05/04/2017, 7:23 AM

## 2017-05-13 ENCOUNTER — Ambulatory Visit: Payer: Medicare Other | Admitting: Nurse Practitioner

## 2017-05-13 NOTE — Progress Notes (Addendum)
Established Previous Bypass   History of Present Illness   [name] is a 67 y.o. (11-Sep-1950) male who presents with chief complaint: left posterior knee/thigh swelling.    Prior procedures include: 1. DCB PTA R CIA (01/16/17) 2. R CIA SIA+S (10/03/11)  3. Redo L CFA to AK pop BPG w/ Propaten (06/23/10) by Dr. Kellie Simmering 4. L CFA to AK pop BPG w/ Goretex (2007) by Dr. Kellie Simmering  The patient's symptoms have not progressed.  The patient's symptoms are: significant new onset swelling behind L knee.  The patient called me on call and I recommended he get evaluated for a possible DVT.  The patient did not get a DVT duplex despite my conversation with the ED physician concerning such.  The patient's treatment regimen currently included: maximal medical management.   The patient's PMH, PSH, SH, and FamHx were reviewed on 05/14/17 are unchanged from 01/16/17.  Current Outpatient Medications  Medication Sig Dispense Refill  . atorvastatin (LIPITOR) 20 MG tablet Take 20 mg by mouth daily.    . budesonide-formoterol (SYMBICORT) 160-4.5 MCG/ACT inhaler Inhale 2 puffs into the lungs 2 (two) times daily. 1 Inhaler 11  . celecoxib (CELEBREX) 200 MG capsule Take 1 capsule (200 mg total) by mouth daily. (HOLD X 5 DAYS WHILE TAKING THE IBUPROFEN) (Patient taking differently: Take 200 mg by mouth daily. )    . clopidogrel (PLAVIX) 75 MG tablet Take 1 tablet (75 mg total) by mouth daily. 30 tablet 11  . Dexlansoprazole (DEXILANT) 30 MG capsule Take 30- 60 min before your first and last meals of the day (Patient taking differently: Take 30 mg by mouth daily. ) 60 capsule 11  . docusate sodium (COLACE) 100 MG capsule Take 100 mg by mouth daily.    Marland Kitchen glipiZIDE (GLUCOTROL) 10 MG tablet Take 10 mg by mouth daily.     Marland Kitchen HYDROcodone-acetaminophen (NORCO) 7.5-325 MG per tablet Take 1 tablet by mouth 2 (two) times daily as needed for moderate pain.     Marland Kitchen JANUVIA 100 MG tablet Take 100 mg by mouth daily.     Marland Kitchen  losartan (COZAAR) 50 MG tablet Take 2 each am 60 tablet 11  . metFORMIN (GLUCOPHAGE) 500 MG tablet Take 500 mg by mouth 2 (two) times daily with a meal.     . nitroGLYCERIN (NITROSTAT) 0.4 MG SL tablet Place 1 tablet (0.4 mg total) under the tongue every 5 (five) minutes as needed for chest pain. 90 tablet 3  . ONE TOUCH ULTRA TEST test strip     . PROAIR HFA 108 (90 BASE) MCG/ACT inhaler Inhale 2 puffs into the lungs every 6 (six) hours as needed for wheezing or shortness of breath.     . vitamin C (ASCORBIC ACID) 500 MG tablet Take 500 mg by mouth daily.    . VOLTAREN 1 % GEL Apply 1 application topically daily as needed (pain).      No current facility-administered medications for this visit.     On ROS today: no rest pain, no intermittent claudication    Physical Examination   Vitals:   05/14/17 0958 05/14/17 0959  BP: (!) 158/90 (!) 156/88  Pulse: 70   Resp: 20   SpO2: 96%   Weight: 178 lb (80.7 kg)   Height: 5' 9.5" (1.765 m)    Body mass index is 25.91 kg/m.  General Alert, O x 3, WD, NAD  Pulmonary Sym exp, good B air movt, CTA B  Cardiac RRR, Nl S1, S2,  no Murmurs, No rubs, No S3,S4  Vascular Vessel Right Left  Radial Palpable Palpable  Brachial Palpable Palpable  Carotid Palpable, No Bruit Palpable, No Bruit  Aorta Not palpable N/A  Femoral Palpable Palpable  Popliteal Not palpable Not palpable  PT Palpable Palpable  DP Palpable Palpable    Gastro- intestinal soft, non-distended, non-tender to palpation, No guarding or rebound, no HSM, no masses, no CVAT B, No palpable prominent aortic pulse,    Musculo- skeletal M/S 5/5 throughout  , Extremities without ischemic changes  , mild fullness in posterior popliteal fossa extending into proximal thigh, No visible varicosities , No Lipodermatosclerosis present  Neurologic Pain and light touch intact in extremities , Motor exam as listed above    Non-invasive Vascular Imaging   ABI (05/14/2017)  R:   ABI: [place]  ([place]),   PT: bi  DP: tri  TBI:  0.70  L:   ABI: 1.07 (1.27),   PT: tri  DP: tri  TBI: 0.62  Aortoiliac Duplex (05/14/2017)  Ao: 43-78 c/s  R iliac: EIA 40-102 c/s  L iliac: CIA 76-114 c/s, EIA 75-95 c/s  [place] patient R CIA stent: 145-185 (Vr 2)  LLE Venous Duplex (05/14/2017)  No DVT or SVT  No Baker's cyst  Medical Decision Making   [name] is a 67 y.o. male who presents with: s/p redo R CFA to AK pop BPG w/ Propaten (Dr. Kellie Simmering), R CIA SIA+S, R CIA DCB PTA.   Unclear source of fully in this patient's posterior knee.  Non-arterial and venous source.  Knee exam is not particularly concerning for intra-articular etiology.  Based on the patient's vascular studies and examination, I have offered the patient: BLE ABI, bypass duplex, and aortoiliac duplex in 6 months months.  I discussed in depth with the patient the nature of atherosclerosis, and emphasized the importance of maximal medical management including strict control of blood pressure, blood glucose, and lipid levels, obtaining regular exercise, and cessation of smoking.    The patient is aware that without maximal medical management the underlying atherosclerotic disease process will progress, limiting the benefit of any interventions.  I discussed in depth with the patient the need for long term surveillance to improve the primary assisted patency of his bypass.  The patient agrees to cooperate with such.  The patient is currently on a statin: Lipitor.   The patient is currently on an anti-platelet: Plavix.  Thank you for allowing [place] to participate in this patient's care.   Adele Barthel, MD, FACS [facility] [place] Office: [phone] Pager: [phone]   Addendum  I left a message on pt's daughters' voice mail communicating these findings per the patient's wishes.  He was also setup for MyChart.  Adele Barthel, MD, FACS [facility]  [place] Office: [phone] Pager: [phone]  05/14/2017, 11:27 AM

## 2017-05-14 ENCOUNTER — Other Ambulatory Visit: Payer: Self-pay | Admitting: Vascular Surgery

## 2017-05-14 ENCOUNTER — Ambulatory Visit (HOSPITAL_COMMUNITY)
Admission: RE | Admit: 2017-05-14 | Discharge: 2017-05-14 | Disposition: A | Discharge status: Home / Self Care | Payer: Medicare Other | Source: Ambulatory Visit | Attending: Vascular Surgery | Admitting: Vascular Surgery

## 2017-05-14 ENCOUNTER — Encounter: Payer: Self-pay | Admitting: Vascular Surgery

## 2017-05-14 ENCOUNTER — Other Ambulatory Visit: Payer: Self-pay

## 2017-05-14 ENCOUNTER — Ambulatory Visit (INDEPENDENT_AMBULATORY_CARE_PROVIDER_SITE_OTHER): Payer: Medicare Other | Admitting: Vascular Surgery

## 2017-05-14 ENCOUNTER — Ambulatory Visit (INDEPENDENT_AMBULATORY_CARE_PROVIDER_SITE_OTHER)
Admission: RE | Admit: 2017-05-14 | Discharge: 2017-05-14 | Disposition: A | Discharge status: Home / Self Care | Payer: Medicare Other | Source: Ambulatory Visit

## 2017-05-14 ENCOUNTER — Ambulatory Visit (INDEPENDENT_AMBULATORY_CARE_PROVIDER_SITE_OTHER)
Admission: RE | Admit: 2017-05-14 | Discharge: 2017-05-14 | Disposition: A | Discharge status: Home / Self Care | Payer: Medicare Other | Source: Ambulatory Visit | Attending: Vascular Surgery | Admitting: Vascular Surgery

## 2017-05-14 VITALS — BP 156/88 | HR 70 | Resp 20 | Ht 69.5 in | Wt 178.0 lb

## 2017-05-14 DIAGNOSIS — I739 Peripheral vascular disease, unspecified: Secondary | ICD-10-CM | POA: Diagnosis not present

## 2017-05-14 DIAGNOSIS — E1151 Type 2 diabetes mellitus with diabetic peripheral angiopathy without gangrene: Secondary | ICD-10-CM | POA: Diagnosis not present

## 2017-05-14 DIAGNOSIS — Z48812 Encounter for surgical aftercare following surgery on the circulatory system: Secondary | ICD-10-CM | POA: Insufficient documentation

## 2017-05-14 DIAGNOSIS — Z95828 Presence of other vascular implants and grafts: Secondary | ICD-10-CM | POA: Insufficient documentation

## 2017-05-14 DIAGNOSIS — I10 Essential (primary) hypertension: Secondary | ICD-10-CM | POA: Insufficient documentation

## 2017-05-14 DIAGNOSIS — R609 Edema, unspecified: Secondary | ICD-10-CM

## 2017-05-14 DIAGNOSIS — M7989 Other specified soft tissue disorders: Secondary | ICD-10-CM | POA: Diagnosis not present

## 2017-05-14 DIAGNOSIS — E785 Hyperlipidemia, unspecified: Secondary | ICD-10-CM | POA: Diagnosis not present

## 2017-05-14 DIAGNOSIS — Z87891 Personal history of nicotine dependence: Secondary | ICD-10-CM | POA: Diagnosis not present

## 2017-05-14 DIAGNOSIS — I70212 Atherosclerosis of native arteries of extremities with intermittent claudication, left leg: Secondary | ICD-10-CM | POA: Diagnosis not present

## 2017-05-14 DIAGNOSIS — I771 Stricture of artery: Secondary | ICD-10-CM

## 2017-05-29 NOTE — Progress Notes (Signed)
Office Visit Note  Patient: [name]             Date of Birth: 13-Feb-1950           MRN: [id number]             PCP: [name], MD Referring: Arsenio Katz, NP Visit Date: 06/06/2017 Occupation: Retired Furniture conservator/restorer    Subjective:  Pain in multiple joints   History of Present Illness: [name] is a 67 y.o. male seen in consultation per request of his PCP.  According to patient he started having lower back pain and pain in his hands when he was in his 68s.  In the year 2002 he was having discomfort in multiple joints and was diagnosed with rheumatoid arthritis.  He states he has been under care of rheumatologist in [place].  He has been on Celebrex for multiple years for joint pain.  He recalls being on methotrexate for some time but he discontinued it because he could not drink alcohol along with methotrexate.  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  He was on [place] for a while which she discontinued.  He has been on [place] since January 2019.  He states he continues to have some discomfort in his bilateral shoulders left wrist joint bilateral hands and occasionally in his knee joints.  He still takes Celebrex on a regular basis for pain management.  He takes hydrocodone on as needed basis.  Activities of Daily Living:  Patient reports morning stiffness for 10 minutes.   Patient Reports nocturnal pain.  Difficulty dressing/grooming: Denies Difficulty climbing stairs: Reports Difficulty getting out of chair: Reports Difficulty using hands for taps, buttons, cutlery, and/or writing: Reports   Review of Systems  Constitutional: Positive for fatigue. Negative for night sweats.  HENT: Negative for mouth sores, mouth dryness and nose dryness.   Eyes: Negative for redness and dryness.  Respiratory: Positive for shortness of breath. Negative for difficulty breathing.        H/o COPD  Cardiovascular: Negative for chest pain,  palpitations, hypertension, irregular heartbeat and swelling in legs/feet.  Gastrointestinal: Negative for constipation and diarrhea.  Endocrine: Negative for increased urination.  Musculoskeletal: Positive for arthralgias, joint pain, joint swelling and morning stiffness. Negative for myalgias, muscle weakness, muscle tenderness and myalgias.  Skin: Negative for color change, rash, hair loss, nodules/bumps, skin tightness, ulcers and sensitivity to sunlight.  Allergic/Immunologic: Negative for susceptible to infections.  Neurological: Negative for dizziness, fainting, memory loss, night sweats and weakness ( ).  Hematological: Negative for swollen glands.  Psychiatric/Behavioral: Positive for sleep disturbance. Negative for depressed mood. The patient is not nervous/anxious.     PMFS History:  Patient Active Problem List   Diagnosis Date Noted  . Rotator cuff syndrome of right shoulder 06/06/2017  . History of  bilateral carpal tunnel release 06/06/2017  . Ganglion cyst of wrist, left 06/06/2017  . Type 2 diabetes mellitus with stage 3 chronic kidney disease, without long-term current use of insulin ([place]) 06/06/2017  . Chest pain, musculoskeletal 04/05/2017  . COPD with acute exacerbation ([place]) 03/13/2017  . Iliac artery stenosis, right ([place]) 02/12/2017  . Hematochezia 02/10/2017  . Abnormal CT of the abdomen 02/10/2017  . Pain in joint, lower leg 03/02/2014  . Discoloration of skin-Left dorsum foot 09/28/2013  . Swelling of limb-Left Calf / Leg 09/28/2013  . PVD (peripheral vascular disease) ([place]) 10/14/2012  . Pain in limb- Left popliteal and  calf 10/14/2012  . Hyperlipidemia   . Essential hypertension 03/05/2012  . 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 History of arterial bypass of lower limb 12/25/2010  . Atherosclerosis of native arteries of extremity with intermittent claudication ([place]) 12/25/2010  . Coronary atherosclerosis of native coronary artery 04/16/2008    . PERIPHERAL VASCULAR DISEASE 04/16/2008  . COPD  GOLD II 04/16/2008  . GASTROESOPHAGEAL REFLUX DISEASE 04/16/2008  . RHEUMATOID ARTHRITIS 04/16/2008  . DIABETES MELLITUS, BORDERLINE 04/16/2008    Past Medical History:  Diagnosis Date  . Arthritis    RA  . Bronchitis   . Carotid artery occlusion   . Cervical spine disease    Neck surgery in 2007 with Dr. Joya Salm  . COPD (chronic obstructive pulmonary disease) ([place])   . Coronary atherosclerosis of native coronary artery    Nonobstrtuctive by catheterization 2007 and 2013  . DVT (deep venous thrombosis) ([place])   . Essential hypertension, benign   . GERD (gastroesophageal reflux disease)   . Hyperlipidemia   . PAD (peripheral artery disease) ([place])    s/p left fem-pop bypass 2007; PTA and stenting of right iliac artery 09/2011  . Rheumatoid arthritis(714.0)   . Type 2 diabetes mellitus (HCC)     Family History  Problem Relation Age of Onset  . Heart attack Mother 53  . Deep vein thrombosis Mother   . Diabetes Mother   . Hyperlipidemia Mother   . Hypertension Mother   . Peripheral vascular disease Mother   . Stroke Father   . Diabetes Father   . Hyperlipidemia Father   . Hypertension Father   . Peripheral vascular disease Father   . Diabetes Sister   . Hyperlipidemia Sister   . Hypertension Sister   . Diabetes Brother   . Hyperlipidemia Brother   . Cancer Brother   . Deep vein thrombosis Brother   . Hypertension Brother   . Peripheral vascular disease Brother   . Hyperlipidemia Son   . Cancer Son   . Hypertension Son   . Colon cancer Neg Hx    Past Surgical History:  Procedure Laterality Date  . ABDOMINAL AORTAGRAM N/A 10/03/2011   Procedure: ABDOMINAL Maxcine [place];  Surgeon: Conrad Petersburg, MD;  Location: [facility] CATH LAB;  Service: Cardiovascular;  Laterality: N/A;  . ABDOMINAL AORTOGRAM W/LOWER EXTREMITY N/A 01/16/2017   Procedure: ABDOMINAL AORTOGRAM W/LOWER EXTREMITY;  Surgeon: Conrad Hoagland, MD;  Location: [place] CV  LAB;  Service: Cardiovascular;  Laterality: N/A;  . CERVICAL DISC SURGERY    . COLONOSCOPY WITH PROPOFOL N/A 02/17/2017   Procedure: COLONOSCOPY WITH PROPOFOL;  Surgeon: Daneil Dolin, MD;  Location[facility];  Service: Endoscopy;  Laterality: N/A;  2:45pm  . FEMORAL-POPLITEAL BYPASS GRAFT  11/06/2005  . ILIAC ARTERY STENT  10-03-11   Right CIA stenting  . PERIPHERAL VASCULAR BALLOON ANGIOPLASTY Right 01/16/2017   Procedure: PERIPHERAL VASCULAR BALLOON ANGIOPLASTY;  Surgeon: Conrad Midway, MD;  Location: [place] [facility] CV LAB;  Service: Cardiovascular;  Laterality: Right;  common iliac   . PR VEIN BYPASS GRAFT,AORTO-FEM-POP  06-22-2010   Redo Left Fem-Pop  . Repair of left arm fracture    . 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 SURGERY SCROTAL / TESTICULAR     Social History   Social History Narrative   The patient lives in [place].      Objective: Vital Signs: BP 127/78 (BP Location: Left Arm, Patient Position: Sitting, Cuff Size: Normal)   Pulse 85   Resp  15   Ht 5' 9.5" (1.765 m)   Wt 180 lb (81.6 kg)   BMI 26.20 kg/m    Physical Exam  Constitutional: He is oriented to person, place, and time. He appears well-developed and well-nourished.  HENT:  Head: Normocephalic and atraumatic.  Eyes: Pupils are equal, round, and reactive to light. Conjunctivae and EOM are normal.  Neck: Normal range of motion. Neck supple.  Cardiovascular: Normal rate, regular rhythm and normal heart sounds.  Pulmonary/Chest: Effort normal and breath sounds normal.  Abdominal: Soft. Bowel sounds are normal.  Neurological: He is alert and oriented to person, place, and time.  Skin: Skin is warm and dry. Capillary refill takes less than 2 seconds.  Psychiatric: He has a normal mood and affect. His behavior is normal.  Nursing note and vitals reviewed.    Musculoskeletal Exam: He has some limitation of extension and flexion of  C-spine, thoracic and lumbar spine were in good range of motion.  He has some  discomfort range of motion of his right shoulder.  He is contracture in his left elbow from a prior injury.  He has partially missing right fifth digit with contracture.  He has limited range of motion of bilateral wrist joints.  He has synovial thickening over bilateral MCP joints.  PIP and DIP prominence was noted.  Hip joints are good range of motion.  Knee joints were in good range of motion without any warmth swelling or effusion.  Bilateral first MTP thickening.  No synovitis was noted.   CDAI Exam: CDAI Homunculus Exam:   Tenderness:  RUE: glenohumeral LUE: wrist Right hand: 2nd MCP Left hand: 2nd MCP Right foot: 1st MTP  Joint Counts:  CDAI Tender Joint count: 4 CDAI Swollen Joint count: 0  Global Assessments:  Patient Global Assessment: 8 Provider Global Assessment: 5  CDAI Calculated Score: 17    Investigation: No additional findings. CBC Latest Ref Rng & Units 01/16/2017 10/03/2011 07/24/2011  WBC 4.5 - 10.5 K/uL - - 9.1  Hemoglobin 13.0 - 17.0 g/dL 13.3 15.0 14.3  Hematocrit 39.0 - 52.0 % 39.0 44.0 43.8  Platelets 150.0 - 400.0 K/uL - - 214.0   CMP Latest Ref Rng & Units 01/16/2017 01/13/2012 10/03/2011  Glucose 65 - 99 mg/dL 131(H) 266(H) 173(H)  BUN 6 - 20 mg/dL 13 12 20   Creatinine 0.61 - 1.24 mg/dL 0.70 1.0 0.90  Sodium 135 - 145 mmol/L 142 137 141  Potassium 3.5 - 5.1 mmol/L 4.1 4.0 4.6  Chloride 101 - 111 mmol/L 105 101 104  CO2 19 - 32 mEq/L - 29 -  Calcium 8.4 - 10.5 mg/dL - 9.0 -  Total Protein 6.0 - 8.3 g/dL - - -  Total Bilirubin 0.3 - 1.2 mg/dL - - -  Alkaline Phos 39 - 117 U/L - - -  AST 0 - 37 U/L - - -  ALT 0 - 53 U/L - - -     Imaging: Xr Foot 2 Views Left  Result Date: 06/06/2017 First MTP narrowing and subluxation was noted.  PIP and DIP narrowing was noted.  No MTP erosions were noted.  No intertarsal joint space narrowing was noted.  No tibiotalar joint space narrowing was noted. Impression: These findings are consistent with  osteoarthritis of the foot.  Xr Foot 2 Views Right  Result Date: 06/06/2017 First MTP is severe narrowing with a spurring was noted.  PIP and DIP narrowing was noted.  No erosive changes were noted.  Calcification of blood  vessels was noted.  No intertarsal or tibiotalar joint space narrowing was noted. Impression: These findings are consistent with osteoarthritis of the foot.  Xr Hand 2 View Left  Result Date: 06/06/2017 First second and third MCP joint space narrowing was noted.  Juxta-articular osteopenia was noted.  PIP and DIP narrowing was noted.  Severe intercarpal and radiocarpal joint space narrowing was noted.  No erosive changes were noted.  Calcification of the arteries was noted. Impression: These findings are consistent with rheumatoid arthritis and osteoarthritis overlap.  Xr Hand 2 View Right  Result Date: 06/06/2017 First second third and fifth MCP narrowing was noted.  PIP and DIP narrowing was noted.  CMC narrowing was noted.  Intercarpal and radiocarpal joint space narrowing was noted.  Possible erosion was noted over the ulnar styloid.  Right fifth PIP is partially amputated. Impression: These findings are consistent with rheumatoid arthritis and osteoarthritis overlap.  Xr Shoulder Right  Result Date: 06/06/2017 High rising humerus was noted.  Type II acromion was noted.   Speciality Comments: No specialty comments available.    Procedures:  No procedures performed Allergies: Patient has no known allergies.   Assessment / Plan:     Visit Diagnoses: Seronegative rheumatoid arthritis ([place]) - Previously on Celebrex 1 tab daily since 2011. Started on MTX- d/c due to alcohol use. 2011 discussed Orencia-declined.  Patient had been treated by rheumatologist at [facility] so far.  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  He was on [place] for a while.  He was placed on Morrie Sheldon this year.  He states he has been doing quite well on Morrie Sheldon but he continues to have pain and  stiffness in multiple joints.  He had no synovitis on examination today.  He has synovial thickening in multiple joints..  Indications side effects contraindications of Morrie Sheldon were discussed at length.  Handout was given and consent was taken.  I will give him prescription refill for Morrie Sheldon when due once the labs are available.- Plan: Urinalysis, Routine w reflex microscopic, Sedimentation rate, Cyclic citrul peptide antibody, IgG, Rheumatoid factor.  Patient reports that he has had pneumococcal vaccine and Shingrix.  Medication counseling: TB test: Pending Hepatitis: Pending Lipid panel: pending SPEP: Pending Immunoglobulins: Pending  Does patient have history of diverticulitis?  No  Counseled patient that Morrie Sheldon is a JAK inhibitor indicated for Rheumatoid Arthritis.  Counseled patient on purpose, proper use, and adverse effects of Xeljanz.  Counseled patient that medication is prescribed to take 5 mg p.o. twice daily.  Reviewed the most common adverse effects including infection, diarrhea, headaches.  Also reviewed rare adverse effects such as bowel injury and the need to contact us if he  develops stomach pain during treatment.  Reviewed with patient that there is the possibility of an increased risk of malignancy but it is not well understood if this increased risk is due to the medication or the disease state.  Counseled patient to avoid live vaccines while on [place].  Provided patient with medication education material and answered all questions.  Patient consented to [place].  Will upload Morrie Sheldon into patient's chart.      High risk medication use - on Xeljanz 5 mg p.o. twice daily- Plan: CBC with Differential/Platelet, COMPLETE METABOLIC PANEL WITH GFR, Hepatitis B core antibody, IgM, Hepatitis B surface antigen, Hepatitis C antibody, HIV antibody, QuantiFERON-TB Gold Plus, Serum protein electrophoresis with reflex, IgG, IgA, IgM, Lipid panel  Generalized osteoarthritis of multiple  sites-he has osteoarthritic changes in his hands and  feet.  He also gives history of DDD of cervical spine.  I do not have any x-rays to support that.  Rotator cuff syndrome of right shoulder-he has been complaining of increased left shoulder joint pain.   History of  bilateral carpal tunnel release  Ganglion cyst of wrist, left-which is bothersome to patient.  Type 2 diabetes mellitus with stage 3 chronic kidney disease, without long-term current use of insulin (HCC)-followed up by his PCP.  Essential hypertension  PVD (peripheral vascular disease) (HCC)  Mixed hyperlipidemia  Atherosclerosis of native coronary artery of native heart without angina pectoris  COPD  GOLD II  Chronic right shoulder pain - Plan: XR Shoulder Right  Pain in both hands - Plan: XR Hand 2 View Right, XR Hand 2 View Left  Pain in both feet - Plan: XR Foot 2 Views Right, XR Foot 2 Views Left  Other fatigue - Plan: CK, TSH, ANA    Orders: Orders Placed This Encounter  Procedures  . XR Shoulder Right  . XR Hand 2 View Right  . XR Hand 2 View Left  . XR Foot 2 Views Right  . XR Foot 2 Views Left  . CBC with Differential/Platelet  . COMPLETE METABOLIC PANEL WITH GFR  . Urinalysis, Routine w reflex microscopic  . CK  . TSH  . Sedimentation rate  . Cyclic citrul peptide antibody, IgG  . Rheumatoid factor  . Hepatitis B core antibody, IgM  . Hepatitis B surface antigen  . Hepatitis C antibody  . HIV antibody  . QuantiFERON-TB Gold Plus  . Serum protein electrophoresis with reflex  . IgG, IgA, IgM  . ANA  . Lipid panel   No orders of the defined types were placed in this encounter.   Face-to-face time spent with patient was 50 minutes. >50% of time was spent in counseling and coordination of care.  Follow-Up Instructions: Return for Rheumatoid arthritis, Osteoarthritis.   Bo Merino, MD  Note - This record has been created using Editor, commissioning.  Chart creation errors have been  sought, but may not always  have been located. Such creation errors do not reflect on  the standard of medical care.

## 2017-06-06 ENCOUNTER — Ambulatory Visit (INDEPENDENT_AMBULATORY_CARE_PROVIDER_SITE_OTHER): Payer: Self-pay

## 2017-06-06 ENCOUNTER — Ambulatory Visit (INDEPENDENT_AMBULATORY_CARE_PROVIDER_SITE_OTHER): Payer: Medicare Other | Admitting: Rheumatology

## 2017-06-06 ENCOUNTER — Encounter: Payer: Self-pay | Admitting: Rheumatology

## 2017-06-06 VITALS — BP 127/78 | HR 85 | Resp 15 | Ht 69.5 in | Wt 180.0 lb

## 2017-06-06 DIAGNOSIS — M0600 Rheumatoid arthritis without rheumatoid factor, unspecified site: Secondary | ICD-10-CM | POA: Diagnosis not present

## 2017-06-06 DIAGNOSIS — J449 Chronic obstructive pulmonary disease, unspecified: Secondary | ICD-10-CM | POA: Diagnosis not present

## 2017-06-06 DIAGNOSIS — M75101 Unspecified rotator cuff tear or rupture of right shoulder, not specified as traumatic: Secondary | ICD-10-CM | POA: Diagnosis not present

## 2017-06-06 DIAGNOSIS — E1122 Type 2 diabetes mellitus with diabetic chronic kidney disease: Secondary | ICD-10-CM

## 2017-06-06 DIAGNOSIS — N183 Chronic kidney disease, stage 3 unspecified: Secondary | ICD-10-CM

## 2017-06-06 DIAGNOSIS — I2510 Atherosclerotic heart disease of native coronary artery without angina pectoris: Secondary | ICD-10-CM | POA: Diagnosis not present

## 2017-06-06 DIAGNOSIS — M79642 Pain in left hand: Secondary | ICD-10-CM | POA: Diagnosis not present

## 2017-06-06 DIAGNOSIS — I10 Essential (primary) hypertension: Secondary | ICD-10-CM | POA: Diagnosis not present

## 2017-06-06 DIAGNOSIS — M25511 Pain in right shoulder: Secondary | ICD-10-CM

## 2017-06-06 DIAGNOSIS — M79671 Pain in right foot: Secondary | ICD-10-CM | POA: Diagnosis not present

## 2017-06-06 DIAGNOSIS — Z79899 Other long term (current) drug therapy: Secondary | ICD-10-CM | POA: Diagnosis not present

## 2017-06-06 DIAGNOSIS — M159 Polyosteoarthritis, unspecified: Secondary | ICD-10-CM | POA: Diagnosis not present

## 2017-06-06 DIAGNOSIS — M67432 Ganglion, left wrist: Secondary | ICD-10-CM | POA: Insufficient documentation

## 2017-06-06 DIAGNOSIS — I739 Peripheral vascular disease, unspecified: Secondary | ICD-10-CM

## 2017-06-06 DIAGNOSIS — M79672 Pain in left foot: Secondary | ICD-10-CM | POA: Diagnosis not present

## 2017-06-06 DIAGNOSIS — R5383 Other fatigue: Secondary | ICD-10-CM

## 2017-06-06 DIAGNOSIS — M79641 Pain in right hand: Secondary | ICD-10-CM

## 2017-06-06 DIAGNOSIS — G8929 Other chronic pain: Secondary | ICD-10-CM

## 2017-06-06 DIAGNOSIS — E782 Mixed hyperlipidemia: Secondary | ICD-10-CM

## 2017-06-06 DIAGNOSIS — E119 Type 2 diabetes mellitus without complications: Secondary | ICD-10-CM | POA: Insufficient documentation

## 2017-06-06 DIAGNOSIS — Z9889 Other specified postprocedural states: Secondary | ICD-10-CM

## 2017-06-06 NOTE — Patient Instructions (Signed)
Tofacitinib tablets What is this medicine? TOFACITINIB (TOE fa SYE ti nib) is a medicine that works on the immune system. This medicine is used to treat rheumatoid arthritis and psoriatic arthritis. This medicine may be used for other purposes; ask your health care provider or pharmacist if you have questions. COMMON BRAND NAME(S): Xeljanz What should I tell my health care provider before I take this medicine? They need to know if you have any of these conditions: -cancer -diabetes -high cholesterol -immune system problems -infection (especially a virus infection such as chickenpox, cold sores, or herpes) -kidney disease -liver disease -low blood counts, like low white cell, platelet, or red cell counts -stomach or intestine problems -an unusual or allergic reaction to tofacitinib, other medicines, foods, dyes, or preservatives -pregnant or trying to get pregnant -breast-feeding How should I use this medicine? Take this medicine by mouth with a glass of water. Follow the directions on the prescription label. You can take it with or without food. If it upsets your stomach, take it with food. A special MedGuide will be given to you by the pharmacist with each prescription and refill. Be sure to read this information carefully each time. Talk to your pediatrician regarding the use of this medicine in children. Special care may be needed. Overdosage: If you think you have taken too much of this medicine contact a poison control center or emergency room at once. NOTE: This medicine is only for you. Do not share this medicine with others. What if I miss a dose? If you miss a dose, take it as soon as you can. If it is almost time for your next dose, take only that dose. Do not take double or extra doses. What may interact with this medicine? Do not take this medicine with any of the following medications: -azathioprine, cyclosporine, or other immunosuppressive drugs -biologic medicines for  arthritis such as abatacept, adalimumab, anakinra, certolizumab, etanercept, golimumab, infliximab, rituximab, secukinumab, tocilizumab, ustekinumab -live vaccines This medicine may also interact with the following medications: -antiviral medicines for hepatitis, HIV or AIDS -certain medicines for fungal infections like fluconazole, itraconazole, ketoconazole, voriconazole -certain medicines for seizures like carbamazepine, phenobarbital, phenytoin -rifampin -supplements, such as St. John's wort This list may not describe all possible interactions. Give your health care provider a list of all the medicines, herbs, non-prescription drugs, or dietary supplements you use. Also tell them if you smoke, drink alcohol, or use illegal drugs. Some items may interact with your medicine. What should I watch for while using this medicine? Tell your doctor or healthcare professional if your symptoms do not start to get better or if they get worse. Avoid taking products that contain aspirin, acetaminophen, ibuprofen, naproxen, or ketoprofen unless instructed by your doctor. These medicines may hide a fever. Call your doctor or health care professional for advice if you get a fever, chills or sore throat, or other symptoms of a cold or flu. Do not treat yourself. This drug decreases your body's ability to fight infections. Try to avoid being around people who are sick. What side effects may I notice from receiving this medicine? Side effects that you should report to your doctor or health care professional as soon as possible: -allergic reactions like skin rash, itching or hives, swelling of the face, lips, or tongue -breathing problems -dizziness -signs of infection - fever or chills, cough, sore throat, pain or trouble passing urine -signs and symptoms of liver injury like dark yellow or brown urine; general ill feeling or flu-like   symptoms; light-colored stools; loss of appetite; nausea; right upper belly  pain; unusually weak or tired; yellowing of the eyes or skin -stomach pain or a sudden change in bowel habits -unusually weak or tired Side effects that usually do not require medical attention (report to your doctor or health care professional if they continue or are bothersome): -diarrhea -headache -muscle aches -runny nose -sinus trouble This list may not describe all possible side effects. Call your doctor for medical advice about side effects. You may report side effects to FDA at 1-800-FDA-1088. Where should I keep my medicine? Keep out of the reach of children. Store between 20 and 25 degrees C (68 and 77 degrees F). Throw away any unused medicine after the expiration date. NOTE: This sheet is a summary. It may not cover all possible information. If you have questions about this medicine, talk to your doctor, pharmacist, or health care provider.  2018 Elsevier/Gold Standard (2016-01-12 11:43:53)  

## 2017-06-09 NOTE — Progress Notes (Signed)
No previous CK values available.  Please add aldolase and myositis assessment panel.

## 2017-06-10 LAB — PROTEIN ELECTROPHORESIS, SERUM, WITH REFLEX
Albumin ELP: 4.6 g/dL (ref 3.8–4.8)
Alpha 1: 0.2 g/dL (ref 0.2–0.3)
Alpha 2: 0.7 g/dL (ref 0.5–0.9)
Beta 2: 0.5 g/dL (ref 0.2–0.5)
Beta Globulin: 0.6 g/dL (ref 0.4–0.6)
Gamma Globulin: 1 g/dL (ref 0.8–1.7)
Total Protein: 7.6 g/dL (ref 6.1–8.1)

## 2017-06-10 LAB — URINALYSIS, ROUTINE W REFLEX MICROSCOPIC
Bilirubin Urine: NEGATIVE
Hgb urine dipstick: NEGATIVE
Ketones, ur: NEGATIVE
Leukocytes, UA: NEGATIVE
Nitrite: NEGATIVE
Protein, ur: NEGATIVE
Specific Gravity, Urine: 1.023 (ref 1.001–1.03)
pH: 5.5 (ref 5.0–8.0)

## 2017-06-10 LAB — LIPID PANEL
Cholesterol: 193 mg/dL (ref <200)
HDL: 64 mg/dL (ref >40)
LDL Cholesterol (Calc): 104 mg/dL (calc) — ABNORMAL HIGH
Non-HDL Cholesterol (Calc): 129 mg/dL (calc) (ref <130)
Total CHOL/HDL Ratio: 3 (calc) (ref <5.0)
Triglycerides: 146 mg/dL (ref <150)

## 2017-06-10 LAB — SEDIMENTATION RATE: Sed Rate: 2 mm/h (ref 0–20)

## 2017-06-10 LAB — CBC WITH DIFFERENTIAL/PLATELET
Basophils Absolute: 31 cells/uL (ref 0–200)
Basophils Relative: 0.5 %
Eosinophils Absolute: 79 cells/uL (ref 15–500)
Eosinophils Relative: 1.3 %
HCT: 40.2 % (ref 38.5–50.0)
Hemoglobin: 13.6 g/dL (ref 13.2–17.1)
Lymphs Abs: 1934 cells/uL (ref 850–3900)
MCH: 29.1 pg (ref 27.0–33.0)
MCHC: 33.8 g/dL (ref 32.0–36.0)
MCV: 86.1 fL (ref 80.0–100.0)
MPV: 10 fL (ref 7.5–12.5)
Monocytes Relative: 6.6 %
Neutro Abs: 3654 cells/uL (ref 1500–7800)
Neutrophils Relative %: 59.9 %
Platelets: 271 10*3/uL (ref 140–400)
RBC: 4.67 10*6/uL (ref 4.20–5.80)
RDW: 14.9 % (ref 11.0–15.0)
Total Lymphocyte: 31.7 %
WBC mixed population: 403 cells/uL (ref 200–950)
WBC: 6.1 10*3/uL (ref 3.8–10.8)

## 2017-06-10 LAB — HEPATITIS B SURFACE ANTIGEN: Hepatitis B Surface Ag: NONREACTIVE

## 2017-06-10 LAB — QUANTIFERON-TB GOLD PLUS
Mitogen-NIL: >10 IU/mL
NIL: 0.19 IU/mL
QuantiFERON-TB Gold Plus: NEGATIVE
TB1-NIL: <0 IU/mL
TB2-NIL: <0 IU/mL

## 2017-06-10 LAB — CYCLIC CITRUL PEPTIDE ANTIBODY, IGG: Cyclic Citrullin Peptide Ab: <16 UNITS

## 2017-06-10 LAB — COMPLETE METABOLIC PANEL WITH GFR
AG Ratio: 1.6 (calc) (ref 1.0–2.5)
ALT: 18 U/L (ref 9–46)
AST: 23 U/L (ref 10–35)
Albumin: 4.7 g/dL (ref 3.6–5.1)
Alkaline phosphatase (APISO): 69 U/L (ref 40–115)
BUN: 18 mg/dL (ref 7–25)
CO2: 32 mmol/L (ref 20–32)
Calcium: 9.7 mg/dL (ref 8.6–10.3)
Chloride: 104 mmol/L (ref 98–110)
Creat: 0.97 mg/dL (ref 0.70–1.25)
GFR, Est African American: 94 mL/min/{1.73_m2} (ref >=60)
GFR, Est Non African American: 81 mL/min/{1.73_m2} (ref >=60)
Globulin: 2.9 g/dL (calc) (ref 1.9–3.7)
Glucose, Bld: 180 mg/dL — ABNORMAL HIGH (ref 65–99)
Potassium: 4.5 mmol/L (ref 3.5–5.3)
Sodium: 142 mmol/L (ref 135–146)
Total Bilirubin: 0.5 mg/dL (ref 0.2–1.2)
Total Protein: 7.6 g/dL (ref 6.1–8.1)

## 2017-06-10 LAB — ANA: Anti Nuclear Antibody(ANA): NEGATIVE

## 2017-06-10 LAB — IGG, IGA, IGM
IgG (Immunoglobin G), Serum: 1064 mg/dL (ref 694–1618)
IgM, Serum: 50 mg/dL (ref 48–271)
Immunoglobulin A: 392 mg/dL (ref 81–463)

## 2017-06-10 LAB — RHEUMATOID FACTOR: Rhuematoid fact SerPl-aCnc: <14 IU/mL (ref <14)

## 2017-06-10 LAB — HEPATITIS C ANTIBODY
Hepatitis C Ab: NONREACTIVE
SIGNAL TO CUT-OFF: 0.03 (ref <1.00)

## 2017-06-10 LAB — TSH: TSH: 1.46 mIU/L (ref 0.40–4.50)

## 2017-06-10 LAB — HEPATITIS B CORE ANTIBODY, IGM: Hep B C IgM: NONREACTIVE

## 2017-06-10 LAB — CK: Total CK: 579 U/L — ABNORMAL HIGH (ref 44–196)

## 2017-06-10 LAB — HIV ANTIBODY (ROUTINE TESTING W REFLEX): HIV 1&2 Ab, 4th Generation: NONREACTIVE

## 2017-06-19 ENCOUNTER — Telehealth: Payer: Self-pay | Admitting: *Deleted

## 2017-06-19 ENCOUNTER — Telehealth: Payer: Self-pay | Admitting: Rheumatology

## 2017-06-19 DIAGNOSIS — M0600 Rheumatoid arthritis without rheumatoid factor, unspecified site: Secondary | ICD-10-CM

## 2017-06-19 NOTE — Telephone Encounter (Signed)
Patient left a voicemail stating he was returning your call. °

## 2017-06-19 NOTE — Telephone Encounter (Signed)
Attempted to contact patient and left message for patient to call the office.  

## 2017-06-19 NOTE — Telephone Encounter (Signed)
-----   Message from Bo Merino, MD sent at 06/09/2017  1:25 PM EDT ----- No previous CK values available.  Please add aldolase and myositis assessment panel.

## 2017-06-20 ENCOUNTER — Other Ambulatory Visit: Payer: Self-pay

## 2017-06-20 DIAGNOSIS — I771 Stricture of artery: Secondary | ICD-10-CM

## 2017-06-20 DIAGNOSIS — I739 Peripheral vascular disease, unspecified: Secondary | ICD-10-CM

## 2017-06-23 ENCOUNTER — Other Ambulatory Visit: Payer: Self-pay

## 2017-06-23 DIAGNOSIS — M19042 Primary osteoarthritis, left hand: Secondary | ICD-10-CM

## 2017-06-23 DIAGNOSIS — M0609 Rheumatoid arthritis without rheumatoid factor, multiple sites: Secondary | ICD-10-CM | POA: Insufficient documentation

## 2017-06-23 DIAGNOSIS — M19041 Primary osteoarthritis, right hand: Secondary | ICD-10-CM | POA: Insufficient documentation

## 2017-06-23 DIAGNOSIS — M0600 Rheumatoid arthritis without rheumatoid factor, unspecified site: Secondary | ICD-10-CM

## 2017-06-23 DIAGNOSIS — M5030 Other cervical disc degeneration, unspecified cervical region: Secondary | ICD-10-CM | POA: Insufficient documentation

## 2017-06-23 DIAGNOSIS — Z79899 Other long term (current) drug therapy: Secondary | ICD-10-CM | POA: Insufficient documentation

## 2017-06-23 DIAGNOSIS — M19072 Primary osteoarthritis, left ankle and foot: Secondary | ICD-10-CM

## 2017-06-23 DIAGNOSIS — M19071 Primary osteoarthritis, right ankle and foot: Secondary | ICD-10-CM | POA: Insufficient documentation

## 2017-06-23 NOTE — Progress Notes (Signed)
Office Visit Note  Patient: [name]             Date of Birth: 02-10-1950           MRN: [id number]             PCP: [name], MD Referring: [name], MD Visit Date: 07/04/2017 Occupation: @GUAROCC @    Subjective:  Pain in all the joints..   History of Present Illness: [name] is a 67 y.o. male history of seronegative rheumatoid arthritis.  He states he continues to have some joint stiffness.  He has been having intermittent swelling in his hands.  He has been taking Xeljanz 5 mg p.o. daily.  He does have some underlying osteoarthritis which causes discomfort.  He states he slipped and fell last Monday and has been having some discomfort in his right hip.  Activities of Daily Living:  Patient reports morning stiffness for 4-5 minutes.   Patient Reports nocturnal pain.  Difficulty dressing/grooming: Denies Difficulty climbing stairs: Denies Difficulty getting out of chair: Reports Difficulty using hands for taps, buttons, cutlery, and/or writing: Reports   Review of Systems  Constitutional: Positive for fatigue. Negative for night sweats.  HENT: Negative for mouth sores, mouth dryness and nose dryness.   Eyes: Negative for redness and dryness.  Respiratory: Negative for shortness of breath and difficulty breathing.   Cardiovascular: Negative for chest pain, palpitations, hypertension, irregular heartbeat and swelling in legs/feet.  Gastrointestinal: Negative for abdominal pain, constipation and diarrhea.  Endocrine: Negative for increased urination.  Genitourinary: Negative for pelvic pain.  Musculoskeletal: Positive for arthralgias, joint pain, joint swelling and morning stiffness. Negative for myalgias, muscle weakness, muscle tenderness and myalgias.  Skin: Negative for color change, rash, hair loss, nodules/bumps, skin tightness, ulcers and sensitivity to sunlight.  Allergic/Immunologic: Negative for susceptible to infections.  Neurological: Negative  for dizziness, fainting, light-headedness, headaches, memory loss, night sweats and weakness.  Hematological: Negative for bruising/bleeding tendency and swollen glands.  Psychiatric/Behavioral: Negative for depressed mood, confusion and sleep disturbance. The patient is not nervous/anxious.     PMFS History:  Patient Active Problem List   Diagnosis Date Noted  . Muscle cramps 07/04/2017  . Contracture of left elbow 07/04/2017  . High risk medication use 06/23/2017  . Primary osteoarthritis of both hands 06/23/2017  . Primary osteoarthritis of both feet 06/23/2017  . 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 factor ([place]) 06/23/2017  . DDD (degenerative disc disease), cervical 06/23/2017  . Rotator cuff syndrome of right shoulder 06/06/2017  . History of  bilateral carpal tunnel release 06/06/2017  . Ganglion cyst of wrist, left 06/06/2017  . Type 2 diabetes mellitus with stage 3 chronic kidney disease, without long-term current use of insulin ([place]) 06/06/2017  . Chest pain, musculoskeletal 04/05/2017  . COPD with acute exacerbation ([place]) 03/13/2017  . Iliac artery stenosis, right ([place]) 02/12/2017  . Hematochezia 02/10/2017  . Abnormal CT of the abdomen 02/10/2017  . Pain in joint, lower leg 03/02/2014  . Discoloration of skin-Left dorsum foot 09/28/2013  . Swelling of limb-Left Calf / Leg 09/28/2013  . PVD (peripheral vascular disease) ([place]) 10/14/2012  . Pain in limb- Left popliteal and calf 10/14/2012  . Hyperlipidemia   . Essential hypertension 03/05/2012  . 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 History of arterial bypass of lower limb 12/25/2010  . Atherosclerosis of native arteries of extremity with intermittent claudication ([place]) 12/25/2010  . Coronary atherosclerosis of native coronary artery 04/16/2008  .  PERIPHERAL VASCULAR DISEASE 04/16/2008  . COPD  GOLD II 04/16/2008  . GASTROESOPHAGEAL REFLUX DISEASE 04/16/2008  . RHEUMATOID  ARTHRITIS 04/16/2008  . DIABETES MELLITUS, BORDERLINE 04/16/2008    Past Medical History:  Diagnosis Date  . Arthritis    RA  . Bronchitis   . Carotid artery occlusion   . Cervical spine disease    Neck surgery in 2007 with Dr. Joya Salm  . COPD (chronic obstructive pulmonary disease) ([place])   . Coronary atherosclerosis of native coronary artery    Nonobstrtuctive by catheterization 2007 and 2013  . DVT (deep venous thrombosis) ([place])   . Essential hypertension, benign   . GERD (gastroesophageal reflux disease)   . Hyperlipidemia   . PAD (peripheral artery disease) ([place])    s/p left fem-pop bypass 2007; PTA and stenting of right iliac artery 09/2011  . Rheumatoid arthritis(714.0)   . Type 2 diabetes mellitus (H[facility])     Family History  Problem Relation Age of Onset  . Heart attack Mother 2  . Deep vein thrombosis Mother   . Diabetes Mother   . Hyperlipidemia Mother   . Hypertension Mother   . Peripheral vascular disease Mother   . Stroke Father   . Diabetes Father   . Hyperlipidemia Father   . Hypertension Father   . Peripheral vascular disease Father   . Diabetes Sister   . Hyperlipidemia Sister   . Hypertension Sister   . Diabetes Brother   . Hyperlipidemia Brother   . Cancer Brother   . Deep vein thrombosis Brother   . Hypertension Brother   . Peripheral vascular disease Brother   . Hyperlipidemia Son   . Cancer Son   . Hypertension Son   . Colon cancer Neg Hx    Past Surgical History:  Procedure Laterality Date  . ABDOMINAL AORTAGRAM N/A 10/03/2011   Procedure: ABDOMINAL Maxcine Ham;  Surgeon: Conrad Edmonson, MD;  [facility]: [facility] CATH LAB;  Service: Cardiovascular;  Laterality: N/A;  . ABDOMINAL AORTOGRAM W/LOWER EXTREMITY N/A 01/16/2017   Procedure: ABDOMINAL AORTOGRAM W/LOWER EXTREMITY;  Surgeon: Conrad Allgood, MD;  Location: [facility] CV LAB;  Service: Cardiovascular;  Laterality: N/A;  . CERVICAL DISC SURGERY    . COLONOSCOPY WITH PROPOFOL N/A 02/17/2017    Procedure: COLONOSCOPY WITH PROPOFOL;  Surgeon: Daneil Dolin, MD;  Location[facility];  Service: Endoscopy;  Laterality: N/A;  2:45pm  . FEMORAL-POPLITEAL BYPASS GRAFT  11/06/2005  . ILIAC ARTERY STENT  10-03-11   Right CIA stenting  . PERIPHERAL VASCULAR BALLOON ANGIOPLASTY Right 01/16/2017   Procedure: PERIPHERAL VASCULAR BALLOON ANGIOPLASTY;  Surgeon: Conrad , MD;  Location: [facility] CV LAB;  Service: Cardiovascular;  Laterality: Right;  common iliac   . PR VEIN BYPASS GRAFT,AORTO-FEM-POP  06-22-2010   Redo Left Fem-Pop  . Repair of left arm fracture    . 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 SURGERY SCROTAL / TESTICULAR     Social History   Social History Narrative   The patient lives in [place].      Objective: Vital Signs: BP 123/79 (BP Location: Left Arm, Patient Position: Sitting, Cuff Size: Normal)   Pulse 93   Resp 16   Ht 5' 10"  (1.778 m)   Wt 173 lb (78.5 kg)   BMI 24.82 kg/m    Physical Exam  Constitutional: He is oriented to person, place, and time. He appears well-developed and well-nourished.  HENT:  Head: Normocephalic and atraumatic.  Eyes: Pupils are equal,  round, and reactive to light. Conjunctivae and EOM are normal.  Neck: Normal range of motion. Neck supple.  Cardiovascular: Normal rate, regular rhythm and normal heart sounds.  Pulmonary/Chest: Effort normal and breath sounds normal.  Abdominal: Soft. Bowel sounds are normal.  Neurological: He is alert and oriented to person, place, and time.  Skin: Skin is warm and dry. Capillary refill takes less than 2 seconds.  Psychiatric: He has a normal mood and affect. His behavior is normal.  Nursing note and vitals reviewed.    Musculoskeletal Exam: He has good range of motion of his C-spine thoracic and lumbar spine.  Shoulder joints with good range of motion.  He has left elbow contracture which is old.  He had limitation of range of motion of his wrist joints.  Some synovial thickening over wrist  and MCP joints was noted.  PIP and DIP  CDAI Exam: CDAI Homunculus Exam:   Joint Counts:  CDAI Tender Joint count: 0 CDAI Swollen Joint count: 0  Global Assessments:  Patient Global Assessment: 3 Provider Global Assessment: 3  CDAI Calculated Score: 6    Investigation: No additional findings. Jun 06, 2017 CBC normal, CMP normal except glucose 180, UA negative, SPEP normal, immunoglobulins normal, hepatitis B-, hepatitis C negative, HIV negative, LDL 104,TB Gold negative, CK 579 elevated, TSH normal, ESR 2, RF negative, anti-CCP negative, ANA negative  Imaging: Xr Foot 2 Views Left  Result Date: 06/06/2017 First MTP narrowing and subluxation was noted.  PIP and DIP narrowing was noted.  No MTP erosions were noted.  No intertarsal joint space narrowing was noted.  No tibiotalar joint space narrowing was noted. Impression: These findings are consistent with osteoarthritis of the foot.  Xr Foot 2 Views Right  Result Date: 06/06/2017 First MTP is severe narrowing with a spurring was noted.  PIP and DIP narrowing was noted.  No erosive changes were noted.  Calcification of blood vessels was noted.  No intertarsal or tibiotalar joint space narrowing was noted. Impression: These findings are consistent with osteoarthritis of the foot.  Xr Hand 2 View Left  Result Date: 06/06/2017 First second and third MCP joint space narrowing was noted.  Juxta-articular osteopenia was noted.  PIP and DIP narrowing was noted.  Severe intercarpal and radiocarpal joint space narrowing was noted.  No erosive changes were noted.  Calcification of the arteries was noted. Impression: These findings are consistent with rheumatoid arthritis and osteoarthritis overlap.  Xr Hand 2 View Right  Result Date: 06/06/2017 First second third and fifth MCP narrowing was noted.  PIP and DIP narrowing was noted.  CMC narrowing was noted.  Intercarpal and radiocarpal joint space narrowing was noted.  Possible erosion was  noted over the ulnar styloid.  Right fifth PIP is partially amputated. Impression: These findings are consistent with rheumatoid arthritis and osteoarthritis overlap.  Xr Shoulder Right  Result Date: 06/06/2017 High rising humerus was noted.  Type II acromion was noted.   Speciality Comments: No specialty comments available.    Procedures:  No procedures performed Allergies: Patient has no known allergies.   Assessment / Plan:     Visit Diagnoses: 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 factor ([place]) - Methotrexate discontinued due to alcohol use, Orencia declined by patient, treated with Morrie Sheldon at [facility].  He has been taking Xeljanz 5 mg only 1 tablet p.o. daily.  He does not have any synovitis on examination.  We will continue him on current dose.  High risk  medication use - Xeljanz 5 mg po qd.  His labs have been stable.  We will continue to monitor his labs.  Primary osteoarthritis of both hands-joint protection muscle strengthening was discussed.  Contracture of left elbow-contracture in his elbow is old.  Primary osteoarthritis of both feet-he has DIP PIP thickening with no synovitis.  DDD (degenerative disc disease), cervical-he has some stiffness with range of motion of the cervical spine.  Rotator cuff syndrome of right shoulder-he has fairly good range of motion.  Muscle cramps - CK 579 - Plan: CK, Aldolase.  His CK was elevated.  He states he falls frequently as it is fishing.  He had no muscle weakness or tenderness on examination today.  He was able to get up from the squatting position.  I would repeat his CK and aldolase with his next blood work.   History of  bilateral carpal tunnel release  Ganglion cyst of wrist, left-which is bothersome to patient.  Type 2 diabetes mellitus with stage 3 chronic kidney disease, without long-term current use of insulin (HCC)-followed up by his PCP.  Essential hypertension  PVD  (peripheral vascular disease) ([place])  Mixed hyperlipidemia  Atherosclerosis of native coronary artery of native heart without angina pectoris  COPD  GOLD II  Orders: Orders Placed This Encounter  Procedures  . CK  . Aldolase   No orders of the defined types were placed in this encounter.   Face-to-face time spent with patient was 30 minutes. >50% of time was spent in counseling and coordination of care.  Follow-Up Instructions: Return in about 5 months (around 12/04/2017) for Rheumatoid arthritis, Osteoarthritis.   Bo Merino, MD  Note - This record has been created using Editor, commissioning.  Chart creation errors have been sought, but may not always  have been located. Such creation errors do not reflect on  the standard of medical care.

## 2017-06-24 NOTE — Progress Notes (Signed)
Please add aldolase and myositis panel

## 2017-07-04 ENCOUNTER — Ambulatory Visit (INDEPENDENT_AMBULATORY_CARE_PROVIDER_SITE_OTHER): Payer: Medicare Other | Admitting: Rheumatology

## 2017-07-04 ENCOUNTER — Encounter: Payer: Self-pay | Admitting: Rheumatology

## 2017-07-04 VITALS — BP 123/79 | HR 93 | Resp 16 | Ht 70.0 in | Wt 173.0 lb

## 2017-07-04 DIAGNOSIS — M19071 Primary osteoarthritis, right ankle and foot: Secondary | ICD-10-CM | POA: Diagnosis not present

## 2017-07-04 DIAGNOSIS — M75101 Unspecified rotator cuff tear or rupture of right shoulder, not specified as traumatic: Secondary | ICD-10-CM

## 2017-07-04 DIAGNOSIS — M0609 Rheumatoid arthritis without rheumatoid factor, multiple sites: Secondary | ICD-10-CM | POA: Diagnosis not present

## 2017-07-04 DIAGNOSIS — M24522 Contracture, left elbow: Secondary | ICD-10-CM | POA: Insufficient documentation

## 2017-07-04 DIAGNOSIS — M5030 Other cervical disc degeneration, unspecified cervical region: Secondary | ICD-10-CM | POA: Diagnosis not present

## 2017-07-04 DIAGNOSIS — M19072 Primary osteoarthritis, left ankle and foot: Secondary | ICD-10-CM

## 2017-07-04 DIAGNOSIS — M19041 Primary osteoarthritis, right hand: Secondary | ICD-10-CM | POA: Diagnosis not present

## 2017-07-04 DIAGNOSIS — Z79899 Other long term (current) drug therapy: Secondary | ICD-10-CM

## 2017-07-04 DIAGNOSIS — M19042 Primary osteoarthritis, left hand: Secondary | ICD-10-CM | POA: Diagnosis not present

## 2017-07-04 DIAGNOSIS — R252 Cramp and spasm: Secondary | ICD-10-CM | POA: Diagnosis not present

## 2017-07-04 LAB — MYOSITIS ASSESSR PLUS JO-1 AUTOABS
EJ Autoabs: NOT DETECTED
Jo-1 Autoabs: <1 AI
Ku Autoabs: NOT DETECTED
Mi-2 Autoabs: NOT DETECTED
OJ Autoabs: NOT DETECTED
PL-12 Autoabs: NOT DETECTED
PL-7 Autoabs: NOT DETECTED
SRP Autoabs: NOT DETECTED

## 2017-07-04 LAB — ALDOLASE: Aldolase: 5.6 U/L (ref <=8.1)

## 2017-07-04 LAB — CK: Total CK: 437 U/L — ABNORMAL HIGH (ref 44–196)

## 2017-07-04 NOTE — Patient Instructions (Signed)
Standing Labs We placed an order today for your standing lab work.    Please come back and get your standing labs in August and then every 3 months  Check CK and aldolase wit next labs  We have open lab Monday through Friday from 8:30-11:30 AM and 1:30-4:00 PM  at the office of Dr. Bo Merino.   You may experience shorter wait times on Monday and Friday afternoons. The office is located at [address] [place], [place], [place] [address] No appointment is necessary.   Labs are drawn by [facility].  You may receive a bill from [facility] for your lab work. If you have any questions regarding directions or hours of operation,  please call [phone].     Take Magnesium malate 250 mg by mouth at bed time for muscle cramps

## 2017-08-01 ENCOUNTER — Encounter: Payer: Self-pay | Admitting: Podiatry

## 2017-08-01 ENCOUNTER — Ambulatory Visit (INDEPENDENT_AMBULATORY_CARE_PROVIDER_SITE_OTHER): Payer: Medicare Other | Admitting: Podiatry

## 2017-08-01 ENCOUNTER — Ambulatory Visit: Payer: Medicare Other | Admitting: Podiatry

## 2017-08-01 DIAGNOSIS — M79675 Pain in left toe(s): Secondary | ICD-10-CM | POA: Diagnosis not present

## 2017-08-01 DIAGNOSIS — E1151 Type 2 diabetes mellitus with diabetic peripheral angiopathy without gangrene: Secondary | ICD-10-CM | POA: Diagnosis not present

## 2017-08-01 DIAGNOSIS — M79674 Pain in right toe(s): Secondary | ICD-10-CM | POA: Diagnosis not present

## 2017-08-01 DIAGNOSIS — B351 Tinea unguium: Secondary | ICD-10-CM

## 2017-08-04 NOTE — Progress Notes (Signed)
Subjective: [name] presents today with diabetes, PAD and cc of painful, discolored, thick toenails which interfere with daily activities and routine tasks. Pain is aggravated when wearing enclosed shoe gear. Pain is getting progressively worse and relieved with periodic professional debridement.  Objective:  Vascular Examination: Capillary refill time <3 seconds x 10 digits Dorsalis pedis pulse absent left foot Dorsalis pedis pulse diminished right foot Posterior tibial pulse absent b/l No digital hair x 10 digits Skin temperature gradient normal b/l  Dermatological Examination: Skin with normal turgor, texture and tone Toenails 1-5 b/l discolored, thick, dystrophic with subungual debris and pain with palpation to nailbeds due to thickness of nails. No wounds. No hyperkeratoses, no rashes.  Musculoskeletal: Muscle strength 5/5 to all LE muscle groups Severe lateral deviation of great toe with medial deviation of 1st metatarsal head b/l Contracted digits 2-5 b/l  Neurological: Sensation intact with 10 gram monofilament.  Assessment: 1. Painful onychomycosis toenails 1-5 b/l 2. NIDDM with Peripheral arterial disease 3. HAV with bunion deformity b/l 4. Hammertoe deformity 2-5 b/l  Plan: 1. Continue diabetic foot care principles. Toenails 1-5 b/l were debrided in length and girth without iatrogenic bleeding. 2. Patient to continue soft, supportive shoe gear 3. Patient qualifies for diabetic shoes based on diagnoses: A. NIDDM with PAD B. Severe HAV with bunion b/l C. Hammertoes 2-5 b/l 4. Patient to report any pedal injuries to medical professional  5. Follow up 9 weeks. 6. Patient/POA to call should there be a concern in the interim.

## 2017-08-11 ENCOUNTER — Ambulatory Visit: Payer: Medicare Other | Admitting: Orthotics

## 2017-08-11 DIAGNOSIS — E1151 Type 2 diabetes mellitus with diabetic peripheral angiopathy without gangrene: Secondary | ICD-10-CM

## 2017-08-11 DIAGNOSIS — M21619 Bunion of unspecified foot: Secondary | ICD-10-CM

## 2017-08-11 DIAGNOSIS — E1159 Type 2 diabetes mellitus with other circulatory complications: Secondary | ICD-10-CM

## 2017-08-11 NOTE — Progress Notes (Signed)
Patient presents today for diabetic shoe measurement and foam casting.  Goals of diabetic shoes/inserts to offer protection from conditions secondary to DM2, offer relief from sheer forces that could lead to ulcerations, protect the foot, and offer greater stability. Patient is under supervision of Catherine  Physician managing patients DM2: Vyas Patient has following documented conditions to qualify for diabetic shoes/inserts: DM2, vascular disease, PN, HAV Patient measured with brannock device: 12W Chose apex [id number]

## 2017-09-30 ENCOUNTER — Ambulatory Visit: Payer: Medicare Other

## 2017-10-08 ENCOUNTER — Ambulatory Visit: Payer: Medicare Other | Admitting: Orthotics

## 2017-10-08 DIAGNOSIS — M2041 Other hammer toe(s) (acquired), right foot: Secondary | ICD-10-CM

## 2017-10-08 DIAGNOSIS — M2011 Hallux valgus (acquired), right foot: Secondary | ICD-10-CM | POA: Diagnosis not present

## 2017-10-08 DIAGNOSIS — M2042 Other hammer toe(s) (acquired), left foot: Secondary | ICD-10-CM

## 2017-10-08 DIAGNOSIS — M2012 Hallux valgus (acquired), left foot: Secondary | ICD-10-CM

## 2017-10-08 DIAGNOSIS — E1151 Type 2 diabetes mellitus with diabetic peripheral angiopathy without gangrene: Secondary | ICD-10-CM | POA: Diagnosis not present

## 2017-10-10 ENCOUNTER — Encounter: Payer: Self-pay | Admitting: Podiatry

## 2017-10-10 ENCOUNTER — Ambulatory Visit (INDEPENDENT_AMBULATORY_CARE_PROVIDER_SITE_OTHER): Payer: Medicare Other | Admitting: Podiatry

## 2017-10-10 DIAGNOSIS — E1151 Type 2 diabetes mellitus with diabetic peripheral angiopathy without gangrene: Secondary | ICD-10-CM

## 2017-10-10 DIAGNOSIS — B353 Tinea pedis: Secondary | ICD-10-CM

## 2017-10-10 DIAGNOSIS — B351 Tinea unguium: Secondary | ICD-10-CM | POA: Diagnosis not present

## 2017-10-10 DIAGNOSIS — M79674 Pain in right toe(s): Secondary | ICD-10-CM

## 2017-10-10 DIAGNOSIS — M79675 Pain in left toe(s): Secondary | ICD-10-CM | POA: Diagnosis not present

## 2017-10-10 MED ORDER — KETOCONAZOLE 2 % EX CREA: TOPICAL_CREAM | CUTANEOUS | 0 refills | Status: DC

## 2017-10-12 NOTE — Progress Notes (Signed)
Subjective: [name] presents today for preventative foot care with painful, discolored, thick toenails. Pain is aggravated when wearing enclosed shoe gear. Pain is getting progressively worse and relieved with periodic professional debridement.   He relates scaling and peeling of both of his feet on today which has been present about 2-3 weeks with periodic itching. He has not tried anything to treat it.  Objective: Vascular Examination: Capillary refill time <3 seconds x 10 digits Dorsalis pedis pulse absent left foot Dorsalis pedis pulse diminished right foot Posterior tibial pulses absent b/l No digital hair x 10 digits Skin temperature gradient WNL b/l  Dermatological Examination: Skin with normal turgor, texture and tone b/l Toenails 1-5 b/l discolored, thick, dystrophic with subungual debris and pain with palpation to nailbeds due to thickness of nails. No wounds, no hyperkeratoses b/l No interdigital maceration noted Diffuse scaling noted plantar and peripheral aspects of both feet with mild foot odor.  Musculoskeletal: Muscle strength 5/5 to all LE muscle groups Severe hallux valgus b/l  Contracted digits 2-5 b/l  Neurological: Sensation intact with 10 gram monofilament b/l   Assessment: 1. Painful onychomycosis toenails 1-5 b/l 2. NIDDM with PAD 3. HAV with bunion deformity b/l 4. Hammertoes 2-5 b/l  Plan: 1. Toenails 1-5 b/l were debrided in length and girth without iatrogenic bleeding. 2. Rx written for Ketoconazole Cream to be applied to both feet and between toes once daily for 4 weeks for tinea pedis. 3. Patient to continue soft, supportive shoe gear 4. Patient to report any pedal injuries to medical professional  5. Follow up 3 months.  6. Patient/POA to call should there be a concern in the interim.

## 2017-10-24 ENCOUNTER — Other Ambulatory Visit: Payer: Self-pay

## 2017-10-24 ENCOUNTER — Encounter: Payer: Self-pay | Admitting: Family

## 2017-10-24 ENCOUNTER — Ambulatory Visit (HOSPITAL_COMMUNITY)
Admission: RE | Admit: 2017-10-24 | Discharge: 2017-10-24 | Disposition: A | Discharge status: Home / Self Care | Payer: Medicare Other | Source: Ambulatory Visit | Attending: Vascular Surgery | Admitting: Vascular Surgery

## 2017-10-24 ENCOUNTER — Ambulatory Visit (INDEPENDENT_AMBULATORY_CARE_PROVIDER_SITE_OTHER): Payer: Medicare Other | Admitting: Family

## 2017-10-24 VITALS — BP 149/94 | HR 92 | Temp 98.0°F | Resp 16 | Ht 70.0 in | Wt 177.6 lb

## 2017-10-24 DIAGNOSIS — M79605 Pain in left leg: Secondary | ICD-10-CM | POA: Diagnosis not present

## 2017-10-24 DIAGNOSIS — Z95828 Presence of other vascular implants and grafts: Secondary | ICD-10-CM | POA: Diagnosis not present

## 2017-10-24 DIAGNOSIS — I771 Stricture of artery: Secondary | ICD-10-CM

## 2017-10-24 DIAGNOSIS — I739 Peripheral vascular disease, unspecified: Secondary | ICD-10-CM

## 2017-10-24 NOTE — Progress Notes (Signed)
[facility]    CC: Follow up peripheral artery occlusive disease  History of Present Illness [name] is a 67 y.o. male who is s/p  1. DCB PTA R CIA (01/16/17) by Dr. Bridgett Larsson 2. R CIA SIA+S (10/03/11) by Dr. Bridgett Larsson 3. Redo L CFA to AK pop BPG w/ Propaten (06/23/10) by Dr. Kellie Simmering 4. L CFA to AK pop BPG w/ Goretex (2007) by Dr. Kellie Simmering   Dr. Bridgett Larsson last evaluated pt on 05-14-17. At that time unnclear source of pain in this patient's posterior knee.  Non-arterial and venous source.  Knee exam was not particularly concerning for intra-articular etiology. Dr. Bridgett Larsson offered the patient: BLE ABI, bypass duplex, and aortoiliac duplex in 6 months.  Pt returns today with c/o pain in his left leg x 1 week, left calf is cramping and waking him up at night.  He also c/o chest pain that worsens when he takes a deep breath, he feels like this is his COPD worsening, has been using his inhalers.  The dyspnea exacerbates when he does mowing.  Pt denies sweating, denies jaw or arm pain.     Diabetic: Yes, states his last A1C was 6.7 Tobacco use: former smoker, quit in 2014  Pt meds include: Statin :Yes ASA: No Other anticoagulants/antiplatelets: Plavix  Past Medical History:  Diagnosis Date  . Arthritis    RA  . Bronchitis   . Carotid artery occlusion   . Cervical spine disease    Neck surgery in 2007 with Dr. Joya Salm  . COPD (chronic obstructive pulmonary disease) ([place])   . Coronary atherosclerosis of native coronary artery    Nonobstrtuctive by catheterization 2007 and 2013  . DVT (deep venous thrombosis) ([place])   . Essential hypertension, benign   . GERD (gastroesophageal reflux disease)   . Hyperlipidemia   . PAD (peripheral artery disease) ([place])    s/p left fem-pop bypass 2007; PTA and stenting of right iliac artery 09/2011  . Rheumatoid arthritis(714.0)   . Type 2 diabetes mellitus ([place])     Social History Social History   Tobacco Use  . Smoking status:  Former Smoker    Packs/day: 1.00    Years: 45.00    Pack years: 45.00    Types: Cigarettes, Cigars    Last attempt to quit: 09/01/2011    Years since quitting: 6.1  . Smokeless tobacco: Never Used  Substance Use Topics  . Alcohol use: No    Alcohol/week: 0.0 standard drinks    Frequency: Never  . Drug use: No    Family History Family History  Problem Relation Age of Onset  . Heart attack Mother 36  . Deep vein thrombosis Mother   . Diabetes Mother   . Hyperlipidemia Mother   . Hypertension Mother   . Peripheral vascular disease Mother   . Stroke Father   . Diabetes Father   . Hyperlipidemia Father   . Hypertension Father   . Peripheral vascular disease Father   . Diabetes Sister   . Hyperlipidemia Sister   . Hypertension Sister   . Diabetes Brother   . Hyperlipidemia Brother   . Cancer Brother   . Deep vein thrombosis Brother   . Hypertension Brother   . Peripheral vascular disease Brother   . Hyperlipidemia Son   . Cancer Son   . Hypertension Son   . Colon cancer Neg Hx     Past Surgical History:  Procedure Laterality Date  . ABDOMINAL AORTAGRAM N/A 10/03/2011  Procedure: ABDOMINAL AORTAGRAM;  Surgeon: Conrad Coleridge, MD;  Location: [facility] CATH LAB;  Service: Cardiovascular;  Laterality: N/A;  . ABDOMINAL AORTOGRAM W/LOWER EXTREMITY N/A 01/16/2017   Procedure: ABDOMINAL AORTOGRAM W/LOWER EXTREMITY;  Surgeon: Conrad Overton, MD;  Location: [facility] CV LAB;  Service: Cardiovascular;  Laterality: N/A;  . CERVICAL DISC SURGERY    . COLONOSCOPY WITH PROPOFOL N/A 02/17/2017   Procedure: COLONOSCOPY WITH PROPOFOL;  Surgeon: Daneil Dolin, MD;  Location[facility];  Service: Endoscopy;  Laterality: N/A;  2:45pm  . FEMORAL-POPLITEAL BYPASS GRAFT  11/06/2005  . ILIAC ARTERY STENT  10-03-11   Right CIA stenting  . PERIPHERAL VASCULAR BALLOON ANGIOPLASTY Right 01/16/2017   Procedure: PERIPHERAL VASCULAR BALLOON ANGIOPLASTY;  Surgeon: Conrad Matoaca, MD;  Location: [facility] LAB;  Service: Cardiovascular;  Laterality: Right;  common iliac   . PR VEIN BYPASS GRAFT,AORTO-FEM-POP  06-22-2010   Redo Left Fem-Pop  . Repair of left arm fracture    . 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 SURGERY SCROTAL / TESTICULAR      No Known Allergies  Current Outpatient Medications  Medication Sig Dispense Refill  . atorvastatin (LIPITOR) 20 MG tablet Take 20 mg by mouth daily.    . budesonide-formoterol (SYMBICORT) 160-4.5 MCG/ACT inhaler Inhale 2 puffs into the lungs 2 (two) times daily. 1 Inhaler 11  . celecoxib (CELEBREX) 200 MG capsule Take 1 capsule (200 mg total) by mouth daily. (HOLD X 5 DAYS WHILE TAKING THE IBUPROFEN) (Patient taking differently: Take 200 mg by mouth daily. )    . clopidogrel (PLAVIX) 75 MG tablet Take 1 tablet (75 mg total) by mouth daily. 30 tablet 11  . Dexlansoprazole (DEXILANT) 30 MG capsule Take 30- 60 min before your first and last meals of the day (Patient taking differently: Take 30 mg by mouth daily. ) 60 capsule 11  . docusate sodium (COLACE) 100 MG capsule Take 100 mg by mouth daily.    Marland Kitchen glipiZIDE (GLUCOTROL) 5 MG tablet Take by mouth.    Marland Kitchen HYDROcodone-acetaminophen (NORCO) 7.5-325 MG per tablet Take 1 tablet by mouth 2 (two) times daily as needed for moderate pain.     Marland Kitchen JANUVIA 100 MG tablet Take 100 mg by mouth daily.     Marland Kitchen ketoconazole (NIZORAL) 2 % cream Apply to both feet once daily for 4 weeks 30 g 0  . losartan (COZAAR) 50 MG tablet Take 2 each am (Patient taking differently: Take 50 mg by mouth daily. Take 2 each am) 60 tablet 11  . metFORMIN (GLUCOPHAGE) 1000 MG tablet TK 1 T PO BID  3  . metFORMIN (GLUCOPHAGE) 500 MG tablet Take 500 mg by mouth 2 (two) times daily with a meal.     . nitroGLYCERIN (NITROSTAT) 0.4 MG SL tablet Place 1 tablet (0.4 mg total) under the tongue every 5 (five) minutes as needed for chest pain. 90 tablet 3  . ONE TOUCH ULTRA TEST test strip     . PROAIR HFA 108 (90 BASE) MCG/ACT inhaler Inhale 2  puffs into the lungs every 6 (six) hours as needed for wheezing or shortness of breath.     Loura Pardon Salicylate (ASPERCREME EX) Apply topically as needed.    . vitamin C (ASCORBIC ACID) 500 MG tablet Take 500 mg by mouth daily.    . VOLTAREN 1 % GEL Apply 1 application topically daily as needed (pain).     Morrie Sheldon 5 MG TABS TK 1  T PO BID  2   No current facility-administered medications for this visit.     ROS: See HPI for pertinent positives and negatives.   Physical Examination  Vitals:   10/24/17 1517  BP: (!) 149/94  Pulse: 92  Resp: 16  Temp: 98 F (36.7 C)  TempSrc: Oral  SpO2: 96%  Weight: 177 lb 9.6 oz (80.6 kg)  Height: 5\' 10"  (1.778 m)   Body mass index is 25.48 kg/m.  General: A&O x 3, WDWN, male.  Gait: normal HENT: No gross abnormalities.  Eyes: PERRLA. Pulmonary: Respirations are non labored, CTAB, fair air movement in all fields, no rales, rhonchi, or wheezes.  Cardiac: regular rhythm, no detected murmur.         Carotid Bruits Right Left   Negative Negative   Radial pulses are 2+ palpable bilaterally   Adominal aortic pulse is not palpable                         VASCULAR EXAM: Extremities without ischemic changes, without Gangrene; without open wounds.                                                                                                          LE Pulses Right Left       FEMORAL   palpable   palpable        POPLITEAL  not palpable   not palpable       POSTERIOR TIBIAL  not palpable   not palpable        DORSALIS PEDIS      ANTERIOR TIBIAL 2+ palpable  not palpable    Abdomen: soft, NT, no palpable masses. Skin: no rashes, no cellulitis, no ulcers noted. Musculoskeletal: no muscle wasting or atrophy.  Neurologic: A&O X 3; appropriate affect, Sensation is normal; MOTOR FUNCTION:  moving all extremities equally, motor strength 5/5 throughout. Speech is fluent/normal. CN 2-12 intact. Psychiatric: Thought content is normal,  mood appropriate for clinical situation.     ASSESSMENT: [name] is a 67 y.o. male who is s/p    - DCB PTA R CIA (01/16/17) by Dr. Bridgett Larsson   - R CIA SIA+S (10/03/11) by Dr. Bridgett Larsson  - Redo L CFA to AK pop BPG w/ Propaten (06/23/10) by Dr. Kellie Simmering  - L CFA to AK pop BPG w/ Goretex (2007) by Dr. Kellie Simmering  Pt returns today with c/o pain in his left leg x 1 week, left calf is cramping and waking him up at night.   ABI's today are normal with triphasic waveforms bilaterally.  I discussed with Dr. Donzetta Matters pt HPI, physical exam results, and normal ABI's today.   I advised pt to notify his PCP if his leg continues to bother him.   DATA  ABI (Date: 10/24/2017):  R:   ABI: 1.16 (was [place] on 05-14-17),   PT: tri  DP: tri  TBI:  0.83, toe pressure 133, (was 0.70)  L:   ABI: 1.22 (was 1.07),   PT: tri  DP:  tri  TBI: 0.61, toe pressure 98, (was 0.62) Normal bilateral ABI with all triphasic waveforms     PLAN:  Follow up as scheduled with Dr. Carlis Abbott on 11-25-17 with ABI, aortoiliac duplex, and LE duplex.  I advised pt to call 911 if his chest pain seems like it's not his COPD, or chest pain or shortness of breath worsens. He is not short of breath now.   I discussed in depth with the patient the nature of atherosclerosis, and emphasized the importance of maximal medical management including strict control of blood pressure, blood glucose, and lipid levels, obtaining regular exercise, and continued cessation of smoking.  The patient is aware that without maximal medical management the underlying atherosclerotic disease process will progress, limiting the benefit of any interventions.  The patient was given information about PAD including signs, symptoms, treatment, what symptoms should prompt the patient to seek immediate medical care, and risk reduction measures to take.  Clemon Chambers, RN, MSN, FNP-C [facility] Phone: [phone]  Clinic MD:  Donzetta Matters  10/24/17 3:24 PM

## 2017-11-04 ENCOUNTER — Ambulatory Visit (INDEPENDENT_AMBULATORY_CARE_PROVIDER_SITE_OTHER): Payer: Medicare Other | Admitting: Internal Medicine

## 2017-11-04 ENCOUNTER — Ambulatory Visit (INDEPENDENT_AMBULATORY_CARE_PROVIDER_SITE_OTHER)
Admission: RE | Admit: 2017-11-04 | Discharge: 2017-11-04 | Disposition: A | Discharge status: Home / Self Care | Payer: Medicare Other | Source: Ambulatory Visit | Attending: Internal Medicine | Admitting: Internal Medicine

## 2017-11-04 ENCOUNTER — Encounter: Payer: Self-pay | Admitting: Internal Medicine

## 2017-11-04 VITALS — BP 128/80 | HR 97 | Ht 70.0 in | Wt 180.8 lb

## 2017-11-04 DIAGNOSIS — R0609 Other forms of dyspnea: Secondary | ICD-10-CM | POA: Insufficient documentation

## 2017-11-04 DIAGNOSIS — R0789 Other chest pain: Secondary | ICD-10-CM | POA: Diagnosis not present

## 2017-11-04 DIAGNOSIS — J449 Chronic obstructive pulmonary disease, unspecified: Secondary | ICD-10-CM | POA: Diagnosis not present

## 2017-11-04 DIAGNOSIS — R0600 Dyspnea, unspecified: Secondary | ICD-10-CM | POA: Insufficient documentation

## 2017-11-04 NOTE — Progress Notes (Signed)
Subjective:    Patient ID: [name], male   DOB: Jul 13, 1950    MRN: [id number]    Brief patient profile:  50 yobm quit smoking 2013 with dx of copd on prn inhalers didn't really use it much and "worse over the years" and placed on BREO but worse fall  2018 so placed on Trelegy and referred to pulmonary clinic 01/28/2017 by Dr   [name]      History of Present Illness  01/28/2017 1st [facility] office visit/ [name]  ? Copd   Chief Complaint  Patient presents with  . Pulmonary Consult    Referred by Dr. [name] for eval of COPD. Pt states that he has been having SOB for the past several yrs, worse for the past 3-6 months. He gets SOB with walking short distances and up and down stairs. He has CP that comes and goes. He also has some cough with white sputum.  He is using proair once daily on average.   doe x years much worse x sev months, cp is not related to breathing or exertion localized L ant chest/ lasts just a few secs Cough is worse since sob worse/ esp hs and early in am Baseline = 15 min walk outside mostly flat but struggles if gets in a hurry or steps  Thinks proair helps his cp but note resolves in 2 secs whether uses proair or not rec Continue the dexilant but for now take it Take 30- 60 min before your first and last meals of the day until cough and breathing better then just Take 30-60 min before first meal of the day  Stop Trelegy and lisinopril Losartan 50 mg only daily takes the place of lisinopril  Plan A = Automatic = symbicort 160 Take 2 puffs first thing in am and then another 2 puffs about 12 hours later.  Work on inhaler technique:   Plan B = Backup Only use your albuterol as a rescue medication        03/11/2017  f/u ov/[name] re: copd II/ brought meds  Chief Complaint  Patient presents with  . Follow-up    zpack helped with his cough. He occ produces some yellow sputum. He has not had to use his albuterol inhaler. He has some CP on the left that comes and  goes.   Dyspnea:  MMRC3 = can't walk 100 yards even at a slow pace at a flat grade s stopping due to sob   Cough: better p rx zpak Sleep: ok  SABA use:  No need for saba L cp  X years comes and goes / better p zpak "it was my nodules" - fleeting and no change with breathing or ex rec Keep your appointment for March 29 but call sooner if condition worsens  Change your cozar to where you take 50 mg x one pill twice daily for a total of 100mg  daily    04/04/2017  f/u ov/[name] re:  Copd GOLD II with marked reversibility on symb 160 2bid  Chief Complaint  Patient presents with  . Follow-up    Feels good but has chest pain that comes and goes.  Dyspnea:  MMRC2 = can't walk a nl pace on a flat grade s sob but does fine slow and flat eg Cough: no cough Sleep: most noct fine  SABA use:  Never  Cp same location as previous parasternal L upper lasts x no more than one-two secs 2-3 times a day returned Jan 2019 vs  many years prior to OV same pattern attributed to "lung nodules'  rec Plan A = Automatic = Symbicort 160 Take 2 puffs first thing in am and then another 2 puffs about 12 hours later.  Work on inhaler technique:   Plan B = Backup Only use your albuterol as a rescue medication      11/04/2017  Acute extended ov/[name] re: cp/ sob  GOLD II copd  Chief Complaint  Patient presents with  . Acute Visit    Increased SOB and chest discomfort off and on x 2 wks. He is using his proair 1-2 x per wk.   Dyspnea:  Across parking lot x months Cough: no  Sleeping: better pain 2 pillows  SABA use: helps some  02: no   L Cp x years  comes and goes over seconds, better supine and always gone when wakes up in am   No obvious day to day or daytime variability or assoc excess/ purulent sputum or mucus plugs or hemoptysis or r chest tightness, subjective wheeze or overt sinus or hb symptoms.   Sleeping as above  without nocturnal  or early am exacerbation  of respiratory  c/o's or need for noct saba.  Also denies any obvious fluctuation of symptoms with weather or environmental changes or other aggravating or alleviating factors except as outlined above   No unusual exposure hx or h/o childhood pna/ asthma or knowledge of premature birth.  Current Allergies, Complete Past Medical History, Past Surgical History, Family History, and Social History were reviewed in [employer] record.  ROS  The following are not active complaints unless bolded Hoarseness, sore throat, dysphagia, dental problems, itching, sneezing,  nasal congestion or discharge of excess mucus or purulent secretions, ear ache,   fever, chills, sweats, unintended wt loss or wt gain, classically pleuritic or exertional cp,  orthopnea pnd or arm/hand swelling  or leg swelling, presyncope, palpitations, abdominal pain, anorexia, nausea, vomiting, diarrhea  or change in bowel habits or change in bladder habits, change in stools or change in urine, dysuria, hematuria,  rash, arthralgias, visual complaints, headache, numbness, weakness or ataxia or problems with walking or coordination,  change in mood or  memory.        Current Meds  Medication Sig  . atorvastatin (LIPITOR) 20 MG tablet Take 20 mg by mouth daily.  . budesonide-formoterol (SYMBICORT) 160-4.5 MCG/ACT inhaler Inhale 2 puffs into the lungs 2 (two) times daily.  . celecoxib (CELEBREX) 200 MG capsule Take 1 capsule (200 mg total) by mouth daily. (HOLD X 5 DAYS WHILE TAKING THE IBUPROFEN) (Patient taking differently: Take 200 mg by mouth daily. )  . clopidogrel (PLAVIX) 75 MG tablet Take 1 tablet (75 mg total) by mouth daily.  Marland Kitchen Dexlansoprazole (DEXILANT) 30 MG capsule Take 30- 60 min before your first and last meals of the day (Patient taking differently: Take 30 mg by mouth daily. )  . docusate sodium (COLACE) 100 MG capsule Take 100 mg by mouth daily.  Marland Kitchen glipiZIDE (GLUCOTROL) 5 MG tablet Take by mouth.  Marland Kitchen HYDROcodone-acetaminophen (NORCO) 7.5-325 MG  per tablet Take 1 tablet by mouth 2 (two) times daily as needed for moderate pain.   Marland Kitchen JANUVIA 100 MG tablet Take 100 mg by mouth daily.   Marland Kitchen ketoconazole (NIZORAL) 2 % cream Apply to both feet once daily for 4 weeks  . losartan (COZAAR) 50 MG tablet Take 2 each am (Patient taking differently: Take 50 mg by mouth daily. Take 2  each am)  . metFORMIN (GLUCOPHAGE) 1000 MG tablet TK 1 T PO BID  . metFORMIN (GLUCOPHAGE) 500 MG tablet Take 500 mg by mouth 2 (two) times daily with a meal.   . nitroGLYCERIN (NITROSTAT) 0.4 MG SL tablet Place 1 tablet (0.4 mg total) under the tongue every 5 (five) minutes as needed for chest pain.  . ONE TOUCH ULTRA TEST test strip   . PROAIR HFA 108 (90 BASE) MCG/ACT inhaler Inhale 2 puffs into the lungs every 6 (six) hours as needed for wheezing or shortness of breath.   Loura Pardon Salicylate (ASPERCREME EX) Apply topically as needed.  . vitamin C (ASCORBIC ACID) 500 MG tablet Take 500 mg by mouth daily.  . VOLTAREN 1 % GEL Apply 1 application topically daily as needed (pain).   Morrie Sheldon 5 MG TABS TK 1 T PO BID           Objective:   Physical Exam     amb bm nad   11/04/2017     180  04/04/2017       179  03/11/2017         180   02/13/2017        175   01/28/17 175 lb 12.8 oz (79.7 kg)  01/16/17 175 lb (79.4 kg)  11/01/16 170 lb (77.1 kg)      Vital signs reviewed - Note on arrival 02 sats  97% on RA   And bp 140/80 s am meds     HEENT: nl dentition, turbinates bilaterally, and oropharynx. Nl external ear canals without cough reflex   NECK :  without JVD/Nodes/TM/ nl carotid upstrokes bilaterally   LUNGS: no acc muscle use,  Nl contour chest which is clear to A and P bilaterally without cough on insp or exp maneuvers   CV:  RRR  no s3 or murmur or increase in P2, and no edema   ABD:  soft and nontender with nl inspiratory excursion in the supine position. No bruits or organomegaly appreciated, bowel sounds nl  MS:  Nl gait/ ext warm without  deformities, calf tenderness, cyanosis or clubbing No obvious joint restrictions - L ant cp elicited with arm wrestle maneuver against resistance   SKIN: warm and dry without lesions    NEURO:  alert, approp, nl sensorium with  no motor or cerebellar deficits apparent.      CXR PA and Lateral:   11/04/2017 :    I personally reviewed images and agree with radiology impression as follows:   No active cardiopulmonary disease.    Labs ordered 11/04/2017   Did not go to lab as req        Assessment:

## 2017-11-04 NOTE — Progress Notes (Signed)
LMTCB

## 2017-11-04 NOTE — Patient Instructions (Addendum)
Work on inhaler technique:  relax and gently blow all the way out then take a nice smooth deep breath back in, triggering the inhaler at same time you start breathing in.  Hold for up to 5 seconds if you can. Blow out thru nose. Rinse and gargle with water when done     Plan A = Automatic = symbicort 160 Take 2 puffs first thing in am and then another 2 puffs about 12 hours later.      Plan B = Backup Only use your albuterol as a rescue medication to be used if you can't catch your breath by resting or doing a relaxed purse lip breathing pattern.  - The less you use it, the better it will work when you need it. - Ok to use the inhaler up to 2 puffs  every 4 hours if you must but call for appointment if use goes up over your usual need - Don't leave home without it !!  (think of it like the spare tire for your car)     Classic   pain pattern suggests ibs:  very limited distribution of pain locations, daytime, not usually exacerbated by exercise  or coughing, worse in sitting position, frequently associated with generalized abd bloating, not as likely to be present supine due to the dome effect of the diaphragm which  is  canceled in that position. Frequently these patients have had multiple negative GI workups and CT scans.  Treatment consists of avoiding foods that cause gas (especially boiled eggs, mexcican food but especially  beans and undercooked vegetables like  spinach and some salads)  and Citrucel 1 heaping tsp twice daily with a large glass of water.  Pain should improve w/in 2 weeks and if not then consider further GI work up.       Please remember to go to the lab and x-ray department downstairs in the basement  for your tests - we will call you with the results when they are available.     Please schedule a follow up office visit in 4 weeks, sooner if needed  with all medications /inhalers/ solutions in hand so we can verify exactly what you are taking. This includes all  medications from all doctors and over the counters - PFTs on return  - note did not go to lab as req        .

## 2017-11-05 ENCOUNTER — Telehealth: Payer: Self-pay | Admitting: *Deleted

## 2017-11-05 ENCOUNTER — Encounter: Payer: Self-pay | Admitting: Internal Medicine

## 2017-11-05 NOTE — Telephone Encounter (Signed)
Patient returned phone call; pt contact # [phone]

## 2017-11-05 NOTE — Telephone Encounter (Signed)
-----   Message from Tanda Rockers, MD sent at 11/05/2017 10:07 AM EDT ----- Did not go to lab as req, suggest he return by end of week to do this unless feeling a lot better

## 2017-11-05 NOTE — Telephone Encounter (Signed)
LMTCB

## 2017-11-05 NOTE — Telephone Encounter (Signed)
Called and spoke to patient made aware he should have gotten lab yesterday at his office visit. Patient states he forgot and will stop back by, by the end of the week to get the blood work done. Nothing further is needed at this time.

## 2017-11-05 NOTE — Assessment & Plan Note (Signed)
Quit smoking 2012 PFT's 01/04/15  FEV1 2.15 (71 % ) ratio 58  p 14 % improvement from saba p ? prior to study with DLCO  70 % corrects to 94  % for alv volume  With only mild curvature f/v loop  - Spirometry 01/28/2017  FEV1 1.59 (53%)  Ratio 59 off all rx  - 01/28/2017    symb 160 2bid and d/c acei and double the dexilant > marked improvement 02/13/2017  PFT's  04/04/2017  FEV1 1.99 (68 % ) ratio 62  p 23 % improvement from saba p nothing prior to study with DLCO  63 % corrects to 82  % for alv volume       - 11/04/2017  Walked RA x 3 laps @ 185 ft each stopped due to  End of study, fast pace, no  Desat, mild sob  - 11/04/2017  After extensive coaching inhaler device,  effectiveness =    75% > restart symb 160 2bid - Spirometry 11/04/2017  FEV1 1.4 (47%)  Ratio 58 off all rx      Did not understand the difference betweeen maint and prn inhalers so tried again today to help him understand using the high performance tire (symbicort 160) vs the spare tire (albuterol) concept    I had an extended discussion with the patient reviewing all relevant studies completed to date and  lasting 15 to 20 minutes of a 25 minute visit    See device teaching which extended face to face time for this visit.  Each maintenance medication was reviewed in detail including emphasizing most importantly the difference between maintenance and prns and under what circumstances the prns are to be triggered using an action plan format that is not reflected in the computer generated alphabetically organized AVS which I have not found useful in most complex patients, especially with respiratory illnesses  Please see AVS for specific instructions unique to this visit that I personally wrote and verbalized to the the pt in detail and then reviewed with pt  by my nurse highlighting any  changes in therapy recommended at today's visit to their plan of care.

## 2017-11-05 NOTE — Assessment & Plan Note (Signed)
Pace was very fast today and had minimal sob so rec rx for copd as outline, slower paced walking and complete the w/u with labs ordered but not done  F/u in 4 weeks with full pfts planned

## 2017-11-05 NOTE — Assessment & Plan Note (Signed)
Classic subdiaphragmatic pain pattern suggests ibs:  Stereotypical, migratory with a very limited distribution of pain locations, daytime, not usually exacerbated by exercise  or coughing, worse in sitting position, frequently associated with generalized abd bloating, not as likely to be present supine due to the dome effect of the diaphragm which  is  canceled in that position. Frequently these patients have had multiple negative GI workups and CT scans.  Treatment consists of avoiding foods that cause gas (especially boiled eggs, mexcican food but especially  beans and undercooked vegetables like  spinach and some salads)  and citrucel 1 heaping tsp twice daily with a large glass of water.  Pain should improve w/in 2 weeks and if not then consider further  work up.

## 2017-11-07 ENCOUNTER — Other Ambulatory Visit: Payer: Self-pay | Admitting: Nurse Practitioner

## 2017-11-07 NOTE — Progress Notes (Signed)
Spoke with pt and notified of results per Dr. Wert. Pt verbalized understanding and denied any questions. 

## 2017-11-11 ENCOUNTER — Other Ambulatory Visit: Payer: Self-pay | Admitting: Nurse Practitioner

## 2017-11-11 DIAGNOSIS — M47812 Spondylosis without myelopathy or radiculopathy, cervical region: Secondary | ICD-10-CM

## 2017-11-12 ENCOUNTER — Encounter (HOSPITAL_COMMUNITY): Payer: Medicare Other

## 2017-11-12 ENCOUNTER — Ambulatory Visit: Payer: Medicare Other | Admitting: Vascular Surgery

## 2017-11-13 NOTE — Progress Notes (Deleted)
Office Visit Note  Patient: [name]             Date of Birth: Nov 26, 1950           MRN: [id number]             PCP: [name], MD Referring: [name], MD Visit Date: 11/27/2017 Occupation: @GUAROCC @  Subjective:  No chief complaint on file.   History of Present Illness: Tiffany Talarico is a 67 y.o. male ***   Activities of Daily Living:  Patient reports morning stiffness for *** {minute/hour:19697}.   Patient {ACTIONS;DENIES/REPORTS:21021675::"Denies"} nocturnal pain.  Difficulty dressing/grooming: {ACTIONS;DENIES/REPORTS:21021675::"Denies"} Difficulty climbing stairs: {ACTIONS;DENIES/REPORTS:21021675::"Denies"} Difficulty getting out of chair: {ACTIONS;DENIES/REPORTS:21021675::"Denies"} Difficulty using hands for taps, buttons, cutlery, and/or writing: {ACTIONS;DENIES/REPORTS:21021675::"Denies"}  No Rheumatology ROS completed.   PMFS History:  Patient Active Problem List   Diagnosis Date Noted  . DOE (dyspnea on exertion) 11/04/2017  . Muscle cramps 07/04/2017  . Contracture of left elbow 07/04/2017  . High risk medication use 06/23/2017  . Primary osteoarthritis of both hands 06/23/2017  . Primary osteoarthritis of both feet 06/23/2017  . 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 factor ([place]) 06/23/2017  . DDD (degenerative disc disease), cervical 06/23/2017  . Rotator cuff syndrome of right shoulder 06/06/2017  . History of  bilateral carpal tunnel release 06/06/2017  . Ganglion cyst of wrist, left 06/06/2017  . Type 2 diabetes mellitus with stage 3 chronic kidney disease, without long-term current use of insulin ([place]) 06/06/2017  . Chest pain, musculoskeletal  vs IBS 04/05/2017  . COPD with acute exacerbation ([place]) 03/13/2017  . Iliac artery stenosis, right ([place]) 02/12/2017  . Hematochezia 02/10/2017  . Abnormal CT of the abdomen 02/10/2017  . Pain in joint, lower leg 03/02/2014  . Discoloration of skin-Left dorsum foot  09/28/2013  . Swelling of limb-Left Calf / Leg 09/28/2013  . PVD (peripheral vascular disease) ([place]) 10/14/2012  . Pain in limb- Left popliteal and calf 10/14/2012  . Hyperlipidemia   . Essential hypertension 03/05/2012  . 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 History of arterial bypass of lower limb 12/25/2010  . Atherosclerosis of native arteries of extremity with intermittent claudication ([place]) 12/25/2010  . Coronary atherosclerosis of native coronary artery 04/16/2008  . PERIPHERAL VASCULAR DISEASE 04/16/2008  . COPD  GOLD II 04/16/2008  . GASTROESOPHAGEAL REFLUX DISEASE 04/16/2008  . RHEUMATOID ARTHRITIS 04/16/2008  . DIABETES MELLITUS, BORDERLINE 04/16/2008    Past Medical History:  Diagnosis Date  . Arthritis    RA  . Bronchitis   . Carotid artery occlusion   . Cervical spine disease    Neck surgery in 2007 with Dr. Joya Salm  . COPD (chronic obstructive pulmonary disease) ([place])   . Coronary atherosclerosis of native coronary artery    Nonobstrtuctive by catheterization 2007 and 2013  . DVT (deep venous thrombosis) ([place])   . Essential hypertension, benign   . GERD (gastroesophageal reflux disease)   . Hyperlipidemia   . PAD (peripheral artery disease) ([place])    s/p left fem-pop bypass 2007; PTA and stenting of right iliac artery 09/2011  . Rheumatoid arthritis(714.0)   . Type 2 diabetes mellitus (HCC)     Family History  Problem Relation Age of Onset  . Heart attack Mother 16  . Deep vein thrombosis Mother   . Diabetes Mother   . Hyperlipidemia Mother   . Hypertension Mother   . Peripheral vascular disease Mother   . Stroke Father   . Diabetes Father   .  Hyperlipidemia Father   . Hypertension Father   . Peripheral vascular disease Father   . Diabetes Sister   . Hyperlipidemia Sister   . Hypertension Sister   . Diabetes Brother   . Hyperlipidemia Brother   . Cancer Brother   . Deep vein thrombosis Brother   . Hypertension Brother   . Peripheral  vascular disease Brother   . Hyperlipidemia Son   . Cancer Son   . Hypertension Son   . Colon cancer Neg Hx    Past Surgical History:  Procedure Laterality Date  . ABDOMINAL AORTAGRAM N/A 10/03/2011   Procedure: ABDOMINAL Maxcine Ham;  Surgeon: Conrad Lewisburg, MD;  Location: [facility] CATH LAB;  Service: Cardiovascular;  Laterality: N/A;  . ABDOMINAL AORTOGRAM W/LOWER EXTREMITY N/A 01/16/2017   Procedure: ABDOMINAL AORTOGRAM W/LOWER EXTREMITY;  Surgeon: Conrad Sweetwater, MD;  Location: [facility] CV LAB;  Service: Cardiovascular;  Laterality: N/A;  . CERVICAL DISC SURGERY    . COLONOSCOPY WITH PROPOFOL N/A 02/17/2017   Procedure: COLONOSCOPY WITH PROPOFOL;  Surgeon: Daneil Dolin, MD;  Location: [facility];  Service: Endoscopy;  Laterality: N/A;  2:45pm  . FEMORAL-POPLITEAL BYPASS GRAFT  11/06/2005  . ILIAC ARTERY STENT  10-03-11   Right CIA stenting  . PERIPHERAL VASCULAR BALLOON ANGIOPLASTY Right 01/16/2017   Procedure: PERIPHERAL VASCULAR BALLOON ANGIOPLASTY;  Surgeon: Conrad Austwell, MD;  Location: [facility] CV LAB;  Service: Cardiovascular;  Laterality: Right;  common iliac   . PR VEIN BYPASS GRAFT,AORTO-FEM-POP  06-22-2010   Redo Left Fem-Pop  . Repair of left arm fracture    . 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 SURGERY SCROTAL / TESTICULAR     Social History   Social History Narrative   The patient lives in [place].     Objective: Vital Signs: There were no vitals taken for this visit.   Physical Exam   Musculoskeletal Exam: ***  CDAI Exam: CDAI Score: Not documented Patient Global Assessment: Not documented; Provider Global Assessment: Not documented Swollen: Not documented; Tender: Not documented Joint Exam   Not documented   There is currently no information documented on the homunculus. Go to the Rheumatology activity and complete the homunculus joint exam.  Investigation: No additional findings.  Imaging: Dg Chest 2 View  Result Date: 11/04/2017 CLINICAL DATA:   Chronic dyspnea EXAM: CHEST - 2 VIEW COMPARISON:  June 09, 2017 FINDINGS: The heart size and mediastinal contours are within normal limits. Both lungs are clear. The visualized skeletal structures are stable. IMPRESSION: No active cardiopulmonary disease. Electronically Signed   By: Abelardo Diesel M.D.   On: 11/04/2017 13:44   Vas [place] Burnard Bunting With/wo Tbi  Result Date: 10/24/2017 LOWER EXTREMITY DOPPLER STUDY Indications: Rest pain, peripheral artery disease, and Non-palpable pulses LLE. High Risk Factors: Hyperlipidemia, Diabetes, past history of smoking, coronary                    artery disease.  Vascular Interventions: Left femoral to popliteal graft redo 06/22/2010. Right                         CIA stent 10/02/2011. Comparison Study: 05/14/2017 Performing Technologist: Caralee Ates BA, RVT, RDMS  Examination Guidelines: A complete evaluation includes at minimum, Doppler waveform signals and systolic blood pressure reading at the level of bilateral brachial, anterior tibial, and posterior tibial arteries, when vessel segments are accessible. Bilateral testing is considered an integral part of a complete  examination. Photoelectric Plethysmograph (PPG) waveforms and toe systolic pressure readings are included as required and additional duplex testing as needed. Limited examinations for reoccurring indications may be performed as noted.  ABI Findings: +---------+------------------+-----+---------+----------------+ Right    Rt Pressure (mmHg)IndexWaveform Comment          +---------+------------------+-----+---------+----------------+ Brachial 158                    triphasic                 +---------+------------------+-----+---------+----------------+ PTA      186               1.16 triphasic                 +---------+------------------+-----+---------+----------------+ DP       255               1.58 triphasicnon-compressible +---------+------------------+-----+---------+----------------+  Great Toe133               0.83 Normal                    +---------+------------------+-----+---------+----------------+ +---------+------------------+-----+---------+-------+ Left     Lt Pressure (mmHg)IndexWaveform Comment +---------+------------------+-----+---------+-------+ Brachial 161                    triphasic        +---------+------------------+-----+---------+-------+ PTA      178               1.11 triphasic        +---------+------------------+-----+---------+-------+ DP       196               1.22 triphasic        +---------+------------------+-----+---------+-------+ Great Toe98                0.61 Abnormal         +---------+------------------+-----+---------+-------+ +-------+-----------+-----------+----------------+------------+ ABI/TBIToday's ABIToday's TBIPrevious ABI    Previous TBI +-------+-----------+-----------+----------------+------------+ Right  1.16       0.83       non-compressible0.70         +-------+-----------+-----------+----------------+------------+ Left   1.22       0.61       1.07            0.62         +-------+-----------+-----------+----------------+------------+ Bilateral ABIs and TBIs appear essentially unchanged compared to prior study on 05/14/2017.  Summary: Right: Resting right ankle-brachial index is within normal range. No evidence of significant right lower extremity arterial disease. The right toe-brachial index is normal. ABIs are unreliable due to calcification. Left: Resting left ankle-brachial index is within normal range. No evidence of significant left lower extremity arterial disease. The left toe-brachial index is abnormal. LT Great toe pressure = 98 mmHg.  *See table(s) above for measurements and observations.  Electronically signed by Servando Snare MD on 10/24/2017 at 5:33:58 PM.    Final     Recent Labs: Lab Results  Component Value Date   WBC 6.1 06/06/2017   HGB 13.6 06/06/2017   PLT 271  06/06/2017   NA 142 06/06/2017   K 4.5 06/06/2017   CL 104 06/06/2017   CO2 32 06/06/2017   GLUCOSE 180 (H) 06/06/2017   BUN 18 06/06/2017   CREATININE 0.97 06/06/2017   BILITOT 0.5 06/06/2017   ALKPHOS 72 06/20/2010   AST 23 06/06/2017   ALT 18 06/06/2017   PROT 7.6 06/06/2017   PROT 7.6 06/06/2017   ALBUMIN  4.0 06/20/2010   CALCIUM 9.7 06/06/2017   GFRAA 94 06/06/2017   QFTBGOLDPLUS NEGATIVE 06/06/2017    Speciality Comments: No specialty comments available.  Procedures:  No procedures performed Allergies: Patient has no known allergies.   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    Primary osteoarthritis of both hands  Primary osteoarthritis of both feet  Contracture of left elbow  DDD (degenerative disc disease), cervical  Rotator cuff syndrome of right shoulder  Muscle cramps -  CK 579    Orders: No orders of the defined types were placed in this encounter.  No orders of the defined types were placed in this encounter.   Face-to-face time spent with patient was *** minutes. Greater than 50% of time was spent in counseling and coordination of care.  Follow-Up Instructions: No follow-ups on file.   Ofilia Neas, PA-C  Note - This record has been created using Dragon software.  Chart creation errors have been sought, but may not always  have been located. Such creation errors do not reflect on  the standard of medical care.

## 2017-11-25 ENCOUNTER — Ambulatory Visit (HOSPITAL_COMMUNITY)
Admission: RE | Admit: 2017-11-25 | Discharge: 2017-11-25 | Disposition: A | Discharge status: Home / Self Care | Payer: Medicare Other | Source: Ambulatory Visit | Attending: Vascular Surgery | Admitting: Vascular Surgery

## 2017-11-25 ENCOUNTER — Ambulatory Visit (INDEPENDENT_AMBULATORY_CARE_PROVIDER_SITE_OTHER)
Admission: RE | Admit: 2017-11-25 | Discharge: 2017-11-25 | Disposition: A | Discharge status: Home / Self Care | Payer: Medicare Other | Source: Ambulatory Visit | Attending: Vascular Surgery | Admitting: Vascular Surgery

## 2017-11-25 ENCOUNTER — Encounter: Payer: Self-pay | Admitting: Vascular Surgery

## 2017-11-25 ENCOUNTER — Ambulatory Visit (INDEPENDENT_AMBULATORY_CARE_PROVIDER_SITE_OTHER): Payer: Medicare Other | Admitting: Vascular Surgery

## 2017-11-25 ENCOUNTER — Other Ambulatory Visit: Payer: Self-pay

## 2017-11-25 VITALS — BP 152/97 | HR 89 | Temp 97.5°F | Resp 18 | Ht 70.0 in | Wt 175.0 lb

## 2017-11-25 DIAGNOSIS — I739 Peripheral vascular disease, unspecified: Secondary | ICD-10-CM | POA: Diagnosis not present

## 2017-11-25 DIAGNOSIS — I771 Stricture of artery: Secondary | ICD-10-CM | POA: Insufficient documentation

## 2017-11-25 NOTE — Progress Notes (Signed)
Office Visit Note  Patient: [name]             Date of Birth: Apr 25, 1950           MRN: [id number]             PCP: [name], MD Referring: [name], MD Visit Date: 12/09/2017 Occupation: @GUAROCC @  Subjective:  Pain in multiple joints   History of Present Illness: [name] is a 67 y.o. male with history of seronegative rheumatoid arthritis.  He is taking Xeljanz 5 mg 1 tablet by mouth daily. He occasionally misses a dose about once a week.  He tries to use a pill box.  He states he is having pain in the left hand, right shoulder, and right great toe.  He states he notices some swelling the left hand intermittently.He states he has chronic lower back pain and muscle tenderness.   He walks for exercise on a regular basis.    Activities of Daily Living:  Patient reports morning stiffness for  5-10 minutes.   Patient Reports nocturnal pain.  Difficulty dressing/grooming: Denies Difficulty climbing stairs: Reports Difficulty getting out of chair: Reports Difficulty using hands for taps, buttons, cutlery, and/or writing: Denies  Review of Systems  Constitutional: Positive for fatigue. Negative for night sweats.  HENT: Negative for mouth sores, trouble swallowing, trouble swallowing, mouth dryness and nose dryness.   Eyes: Negative for redness, visual disturbance and dryness.  Respiratory: Negative for cough, hemoptysis, shortness of breath and difficulty breathing.   Cardiovascular: Negative for chest pain, palpitations, hypertension, irregular heartbeat and swelling in legs/feet.  Gastrointestinal: Positive for constipation. Negative for blood in stool and diarrhea.  Endocrine: Negative for increased urination.  Genitourinary: Negative for painful urination.  Musculoskeletal: Positive for arthralgias, joint pain, morning stiffness and muscle tenderness. Negative for joint swelling, myalgias, muscle weakness and myalgias.  Skin: Negative for color change, rash,  hair loss, nodules/bumps, skin tightness, ulcers and sensitivity to sunlight.  Allergic/Immunologic: Negative for susceptible to infections.  Neurological: Negative for dizziness, fainting, memory loss, night sweats and weakness.  Hematological: Negative for swollen glands.  Psychiatric/Behavioral: Negative for depressed mood and sleep disturbance. The patient is nervous/anxious.     PMFS History:  Patient Active Problem List   Diagnosis Date Noted  . DOE (dyspnea on exertion) 11/04/2017  . Muscle cramps 07/04/2017  . Contracture of left elbow 07/04/2017  . High risk medication use 06/23/2017  . Primary osteoarthritis of both hands 06/23/2017  . Primary osteoarthritis of both feet 06/23/2017  . 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 factor ([place]) 06/23/2017  . DDD (degenerative disc disease), cervical 06/23/2017  . Rotator cuff syndrome of right shoulder 06/06/2017  . History of  bilateral carpal tunnel release 06/06/2017  . Ganglion cyst of wrist, left 06/06/2017  . Type 2 diabetes mellitus with stage 3 chronic kidney disease, without long-term current use of insulin ([place]) 06/06/2017  . Chest pain, musculoskeletal  vs IBS 04/05/2017  . COPD with acute exacerbation ([place]) 03/13/2017  . Iliac artery stenosis, right ([place]) 02/12/2017  . Hematochezia 02/10/2017  . Abnormal CT of the abdomen 02/10/2017  . Pain in joint, lower leg 03/02/2014  . Discoloration of skin-Left dorsum foot 09/28/2013  . Swelling of limb-Left Calf / Leg 09/28/2013  . PVD (peripheral vascular disease) ([place]) 10/14/2012  . Pain in limb- Left popliteal and calf 10/14/2012  . Hyperlipidemia   . Essential hypertension 03/05/2012  . 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  History of arterial bypass of lower limb 12/25/2010  . Atherosclerosis of native arteries of extremity with intermittent claudication ([place]) 12/25/2010  . Coronary atherosclerosis of native coronary artery  04/16/2008  . PERIPHERAL VASCULAR DISEASE 04/16/2008  . COPD  GOLD II 04/16/2008  . GASTROESOPHAGEAL REFLUX DISEASE 04/16/2008  . RHEUMATOID ARTHRITIS 04/16/2008  . DIABETES MELLITUS, BORDERLINE 04/16/2008    Past Medical History:  Diagnosis Date  . Arthritis    RA  . Bronchitis   . Carotid artery occlusion   . Cervical spine disease    Neck surgery in 2007 with Dr. Joya Salm  . COPD (chronic obstructive pulmonary disease) ([place])   . Coronary atherosclerosis of native coronary artery    Nonobstrtuctive by catheterization 2007 and 2013  . DVT (deep venous thrombosis) ([place])   . Essential hypertension, benign   . GERD (gastroesophageal reflux disease)   . Hyperlipidemia   . PAD (peripheral artery disease) ([place])    s/p left fem-pop bypass 2007; PTA and stenting of right iliac artery 09/2011  . Rheumatoid arthritis(714.0)   . Type 2 diabetes mellitus ([facility])     Family History  Problem Relation Age of Onset  . Heart attack Mother 63  . Deep vein thrombosis Mother   . Diabetes Mother   . Hyperlipidemia Mother   . Hypertension Mother   . Peripheral vascular disease Mother   . Stroke Father   . Diabetes Father   . Hyperlipidemia Father   . Hypertension Father   . Peripheral vascular disease Father   . Diabetes Sister   . Hyperlipidemia Sister   . Hypertension Sister   . Diabetes Brother   . Hyperlipidemia Brother   . Cancer Brother   . Deep vein thrombosis Brother   . Hypertension Brother   . Peripheral vascular disease Brother   . Hyperlipidemia Son   . Cancer Son   . Hypertension Son   . Colon cancer Neg Hx    Past Surgical History:  Procedure Laterality Date  . ABDOMINAL AORTAGRAM N/A 10/03/2011   Procedure: ABDOMINAL Maxcine Ham;  Surgeon: Conrad McNeal, MD;  Location: [facility] CATH LAB;  Service: Cardiovascular;  Laterality: N/A;  . ABDOMINAL AORTOGRAM W/LOWER EXTREMITY N/A 01/16/2017   Procedure: ABDOMINAL AORTOGRAM W/LOWER EXTREMITY;  Surgeon: Conrad Asherton, MD;  Location: [facility] CV LAB;  Service: Cardiovascular;  Laterality: N/A;  . CERVICAL DISC SURGERY    . COLONOSCOPY WITH PROPOFOL N/A 02/17/2017   Procedure: COLONOSCOPY WITH PROPOFOL;  Surgeon: Daneil Dolin, MD;  Location: [facility];  Service: Endoscopy;  Laterality: N/A;  2:45pm  . FEMORAL-POPLITEAL BYPASS GRAFT  11/06/2005  . ILIAC ARTERY STENT  10-03-11   Right CIA stenting  . PERIPHERAL VASCULAR BALLOON ANGIOPLASTY Right 01/16/2017   Procedure: PERIPHERAL VASCULAR BALLOON ANGIOPLASTY;  Surgeon: Conrad [place], MD;  Location: [place] CV LAB;  Service: Cardiovascular;  Laterality: Right;  common iliac   . PR VEIN BYPASS GRAFT,AORTO-FEM-POP  06-22-2010   Redo Left Fem-Pop  . Repair of left arm fracture    . 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 SURGERY SCROTAL / TESTICULAR     Social History   Social History Narrative   The patient lives in [place].     Objective: Vital Signs: BP 124/81 (BP Location: Left Arm, Patient Position: Sitting, Cuff Size: Normal)   Pulse (!) 111   Resp 15   Ht 5\' 10"  (1.778 m)   Wt 180 lb (81.6 kg)   BMI 25.83 kg/m  Physical Exam  Constitutional: He is oriented to person, place, and time. He appears well-developed and well-nourished.  HENT:  Head: Normocephalic and atraumatic.  Eyes: Pupils are equal, round, and reactive to light. Conjunctivae and EOM are normal.  Neck: Normal range of motion. Neck supple.  Cardiovascular: Normal rate, regular rhythm and normal heart sounds.  Pulmonary/Chest: Effort normal and breath sounds normal.  Abdominal: Soft. Bowel sounds are normal.  Lymphadenopathy:    He has no cervical adenopathy.  Neurological: He is alert and oriented to person, place, and time.  Skin: Skin is warm and dry. Capillary refill takes less than 2 seconds.  Psychiatric: He has a normal mood and affect. His behavior is normal.  Nursing note and vitals reviewed.    Musculoskeletal Exam: C-spine, thoracic spine, and lumbar spine good ROM.  No  midline spinal tenderness. Shoulder joints good ROM with no discomfort. Bilateral elbow joint contractures.  Limited ROM of bilateral wrist joints. Synovial thickening of all right MCPS. Contracture of right 5th PIP and amputation of distal phalanx. Left 2nd MCP synovial thickening. Contracture of several PIP joints in left hand. Hip joints, knee joints, ankle joints, MTPs, PIPs, and DIPs good ROM with no synovitis.  No warmth or effusion of knee joints.  No tenderness or swelling of ankle joints.  No tenderness of trochanteric bursa bilaterally.   CDAI Exam: CDAI Score: Not documented Patient Global Assessment: Not documented; Provider Global Assessment: Not documented Swollen: Not documented; Tender: Not documented Joint Exam   Not documented   There is currently no information documented on the homunculus. Go to the Rheumatology activity and complete the homunculus joint exam.  Investigation: No additional findings.  Imaging: Vas [place] Abi With/wo Tbi  Result Date: 11/25/2017 LOWER EXTREMITY DOPPLER STUDY Indications: Peripheral artery disease. High Risk Factors: Hypertension, hyperlipidemia, non insulin dependent diabetes                    mellitus.  Vascular Interventions: 01/16/17: Right CIA angioplasty; 10/02/11: Right CIA stent                         placed; 06/22/10: Redo left fem-pop graft. Performing Technologist: Ralene Cork RVT  Examination Guidelines: A complete evaluation includes at minimum, Doppler waveform signals and systolic blood pressure reading at the level of bilateral brachial, anterior tibial, and posterior tibial arteries, when vessel segments are accessible. Bilateral testing is considered an integral part of a complete examination. Photoelectric Plethysmograph (PPG) waveforms and toe systolic pressure readings are included as required and additional duplex testing as needed. Limited examinations for reoccurring indications may be performed as noted.  ABI Findings:  +---------+------------------+-----+---------+--------+ Right    Rt Pressure (mmHg)IndexWaveform Comment  +---------+------------------+-----+---------+--------+ Brachial 170                                      +---------+------------------+-----+---------+--------+ ATA      255               1.50 triphasic         +---------+------------------+-----+---------+--------+ PTA      150               0.88 biphasic          +---------+------------------+-----+---------+--------+ Great Toe133               0.78                   +---------+------------------+-----+---------+--------+ +---------+------------------+-----+--------+-------+  Left     Lt Pressure (mmHg)IndexWaveformComment +---------+------------------+-----+--------+-------+ Brachial 165                                    +---------+------------------+-----+--------+-------+ ATA      157               0.92 biphasic        +---------+------------------+-----+--------+-------+ PTA      201               1.18 biphasic        +---------+------------------+-----+--------+-------+ Great Toe144               0.85                 +---------+------------------+-----+--------+-------+ +-------+-----------+-----------+------------+------------+ ABI/TBIToday's ABIToday's TBIPrevious ABIPrevious TBI +-------+-----------+-----------+------------+------------+ Right  [place]         0.78       1.16        0.83         +-------+-----------+-----------+------------+------------+ Left   1.18       0.85       1.22        0.61         +-------+-----------+-----------+------------+------------+  Summary: Right: Resting right ankle-brachial index indicates noncompressible right lower extremity arteries.The right toe-brachial index is normal. RT great toe pressure = 133 mmHg. Left: Resting left ankle-brachial index is within normal range. No evidence of significant left lower extremity arterial disease. The  left toe-brachial index is normal. ABIs are unreliable. LT Great toe pressure = 144 mmHg.  *See table(s) above for measurements and observations.  Electronically signed by Monica Martinez MD on 11/25/2017 at 11:43:24 AM.    Final    Vas [place] Lower Extremity Arterial Duplex  Result Date: 11/25/2017 LOWER EXTREMITY ARTERIAL DUPLEX STUDY Indications: Peripheral artery disease. High Risk Factors: Hypertension, hyperlipidemia, non insulin dependent diabetes                    mellitus.  Vascular Interventions: 01/16/17: Right CIA angioplasty; 10/02/11: Right CIA stent                         placed; 06/22/10: Redo left fem-pop graft. Current ABI:            Right: [place]/0.78 Left: 1.18/0.85 Performing Technologist: Ralene Cork RVT  Examination Guidelines: A complete evaluation includes B-mode imaging, spectral Doppler, color Doppler, and power Doppler as needed of all accessible portions of each vessel. Bilateral testing is considered an integral part of a complete examination. Limited examinations for reoccurring indications may be performed as noted.  Left Graft #1: femoral to above knee popliteaal +--------------------+--------+--------+---------+--------+                     PSV cm/sStenosisWaveform Comments +--------------------+--------+--------+---------+--------+ Inflow              99              biphasic          +--------------------+--------+--------+---------+--------+ Proximal Anastomosis85              triphasic         +--------------------+--------+--------+---------+--------+ Proximal Graft      47              triphasic         +--------------------+--------+--------+---------+--------+ Mid Graft  36              triphasic         +--------------------+--------+--------+---------+--------+ Distal Graft        47              triphasic         +--------------------+--------+--------+---------+--------+ Distal Anastamosis  58              triphasic          +--------------------+--------+--------+---------+--------+ Outflow             27              biphasic          +--------------------+--------+--------+---------+--------+  Summary: Left Graft(s): Femoral to above knee popliteaal bypass graft patent with no evidence of stenosis noted.  See table(s) above for measurements and observations. Electronically signed by Monica Martinez MD on 11/25/2017 at 11:43:07 AM.    Final    Vas US Aorta/ivc/iliacs  Result Date: 11/25/2017 ABDOMINAL AORTA STUDY Indications: Follow up right CIA stent. Risk         Hypertension, hyperlipidemia, non insulin dependent diabetes Factors:     mellitus. Vascular Interventions: 01/16/17: Right CIA angioplasty; 10/02/11: Right CIA stent                         placed; 06/22/10: Redo left fem-pop graft.  Comparison Study: 05/14/17 Performing Technologist: Ralene Cork RVT  Examination Guidelines: A complete evaluation includes B-mode imaging, spectral Doppler, color Doppler, and power Doppler as needed of all accessible portions of each vessel. Bilateral testing is considered an integral part of a complete examination. Limited examinations for reoccurring indications may be performed as noted.  Abdominal Aorta Findings: +-------------+-------+----------+----------+---------+--------+--------+ Location     AP (cm)Trans (cm)PSV (cm/s)Waveform ThrombusComments +-------------+-------+----------+----------+---------+--------+--------+ RT CIA Distal                 120       biphasic                  +-------------+-------+----------+----------+---------+--------+--------+ RT EIA Prox                   87        biphasic                  +-------------+-------+----------+----------+---------+--------+--------+ RT EIA Distal                 76        biphasic                  +-------------+-------+----------+----------+---------+--------+--------+ LT CIA Prox                   150       triphasic                  +-------------+-------+----------+----------+---------+--------+--------+ LT EIA Distal                 71        biphasic                  +-------------+-------+----------+----------+---------+--------+--------+  Right Stent(s): +---------------+---++---------++ Prox to Stent  71 triphasic +---------------+---++---------++ Proximal Stent 141biphasic  +---------------+---++---------++ Mid Stent      166biphasic  +---------------+---++---------++ Distal Stent   154biphasic  +---------------+---++---------++ Distal to Stent230biphasic  +---------------+---++---------++   Electronically signed by Monica Martinez MD on 11/25/2017 at 11:43:51 AM.  Final     Recent Labs: Lab Results  Component Value Date   WBC 6.1 06/06/2017   HGB 13.6 06/06/2017   PLT 271 06/06/2017   NA 142 06/06/2017   K 4.5 06/06/2017   CL 104 06/06/2017   CO2 32 06/06/2017   GLUCOSE 180 (H) 06/06/2017   BUN 18 06/06/2017   CREATININE 0.97 06/06/2017   BILITOT 0.5 06/06/2017   ALKPHOS 72 06/20/2010   AST 23 06/06/2017   ALT 18 06/06/2017   PROT 7.6 06/06/2017   PROT 7.6 06/06/2017   ALBUMIN 4.0 06/20/2010   CALCIUM 9.7 06/06/2017   GFRAA 94 06/06/2017   QFTBGOLDPLUS NEGATIVE 06/06/2017    Speciality Comments: Prior therapy includes: methotrexate ( d/c alcohol use), Arava (d/c patient preference)  Procedures:  No procedures performed Allergies: Patient has no known allergies.   Current regimen includes Xeljanz 5 mg QD  Last TB gold negative on 06/06/17.  Most recent CBC/CMP within normal limits on 06/06/17. Most recent lipid panel showed mildly elevated LDL at 104 on 06/06/17 and is on Lipitor 20 mg managed by PCP.  Due for CBC/CMP today and then every 3 months. Recommend flu, Pneumovax 23, Prevnar 13, and Shingrix as indicated.    Assessment / Plan:     Visit Diagnoses: 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 factor ([place]): He has no  synovitis on exam.  He has synovial thickening and contractures of several joints as described above. He has not had any recent rheumatoid arthritis flares. He is clinically doing well on Xeljanz 5 mg 1 tablet by mouth daily.  He occasionally misses doses so we stressed the importance of compliance.  He is going to try using a pill box more consistently.  He will continue on this current treatment regimen.  A refill of Morrie Sheldon was sent to the pharmacy.  He was advised to notify us if he develops increased joint pain or joint swelling.  He will follow up in 5 months.   High risk medication use - He is taking Xeljanz 5 mg 1 tablet by mouth daily.  Methotrexate discontinued due to alcohol use, Orencia declined by patient, treated with Morrie Sheldon at [place] 5 mg 1 tab po daily. CBC and CMP were drawn today to monitor for drug toxicity.  He will return for lab work in March and every 3 months. Plan: CBC with Differential/Platelet, COMPLETE METABOLIC PANEL WITH GFR  Contracture of left elbow: He has a chronic left elbow joint contracture.  Right elbow has mild joint contracture.   Primary osteoarthritis of both hands: He has PIP and DIP synovial thickening consistent with osteoarthritis.  Joint protection and muscle strengthening were discussed.   Primary osteoarthritis of both feet: He wears proper fitting shoes. He has discomfort in the right great toe intermittently.   DDD (degenerative disc disease), cervical: He has good ROM on exam.  He has intermittent discomfort.  He has no symptoms of radiculopathy at this time.   Elevated CK -CK was 437 on 06/23/17. We will check CK today.  He states he has some muscle tenderness at times.  He walks for exercise but he does not lift weights. Aldolase was WNL and myositis panel WNL on 06/23/17. Plan: CK   Rotator cuff syndrome of right shoulder: Good ROM on exam.  He has intermittent right shoulder pain. If he lays on his right shoulder at  night he experiences discomfort.   Muscle cramps: CK will be drawn today.  History of bilateral carpal tunnel release: He is asymptomatic at this time.   Other medical conditions are listed as follows:   Ganglion cyst of wrist, left  Type 2 diabetes mellitus with stage 3 chronic kidney disease, without long-term current use of insulin ([facility])  Essential hypertension  PVD (peripheral vascular disease) ([facility])  Mixed hyperlipidemia  COPD  GOLD II  Atherosclerosis of native coronary artery of native heart without angina pectoris    Orders: Orders Placed This Encounter  Procedures  . CBC with Differential/Platelet  . COMPLETE METABOLIC PANEL WITH GFR  . CK   Meds ordered this encounter  Medications  . XELJANZ 5 MG TABS    Sig: Take 1 tablet by mouth daily.    Dispense:  90 tablet    Refill:  0      Follow-Up Instructions: Return in about 5 months (around 05/10/2018) for Rheumatoid arthritis, Osteoarthritis, DDD.   Brett Neas, PA-C   I examined and evaluated the patient with Brett Sams PA.  Patient has not been very compliant with his consultants.  He states he misses his dose at least once a week.  He is on low-dose Xeljanz taking 1 mg p.o. daily.  On my examination today he had synovial thickening but no active synovitis.  He will continue on current regimen for right now.  The plan of care was discussed as noted above.  Bo Merino, MD  Note - This record has been created using Editor, commissioning.  Chart creation errors have been sought, but may not always  have been located. Such creation errors do not reflect on  the standard of medical care.

## 2017-11-25 NOTE — Progress Notes (Signed)
Patient name: [name] MRN: [id number] DOB: 06-05-1950 Sex: male  REASON FOR VISIT: 47-month follow-up  HPI: [name] is a 67 y.o. male that presents for short interval follow-up given concern for some new discomfort in his left leg.  He has a complex history including a previous left common femoral to AK pop bypass with propaten in 2007 by Dr. Kellie Simmering and then a redo left common femoral to AK pop bypass with propaten in 2012.  In addition he has undergone a subintimal angioplasty of his right common iliac artery in 2013 with a recent drug-coated balloon angioplasty in 2019 by Dr. Bridgett Larsson.  Feels like his right leg is doing well and not giving him any problems.  When asked about the pain in his left leg it is a little bit difficult to sort out.  He states at times he has some pain in the back of his calf but then other times states it is along his shin sometimes it happens when he walks other times states he can walk and exercise for long periods of time with no discomfort.  Past Medical History:  Diagnosis Date  . Arthritis    RA  . Bronchitis   . Carotid artery occlusion   . Cervical spine disease    Neck surgery in 2007 with Dr. Joya Salm  . COPD (chronic obstructive pulmonary disease) ([place])   . Coronary atherosclerosis of native coronary artery    Nonobstrtuctive by catheterization 2007 and 2013  . DVT (deep venous thrombosis) ([place])   . Essential hypertension, benign   . GERD (gastroesophageal reflux disease)   . Hyperlipidemia   . PAD (peripheral artery disease) ([place])    s/p left fem-pop bypass 2007; PTA and stenting of right iliac artery 09/2011  . Rheumatoid arthritis(714.0)   . Type 2 diabetes mellitus ([place])     Past Surgical History:  Procedure Laterality Date  . ABDOMINAL AORTAGRAM N/A 10/03/2011   Procedure: ABDOMINAL Maxcine Ham;  Surgeon: Conrad Foard, MD;  Location: [facility] CATH LAB;  Service: Cardiovascular;  Laterality: N/A;  . ABDOMINAL AORTOGRAM W/LOWER EXTREMITY N/A  01/16/2017   Procedure: ABDOMINAL AORTOGRAM W/LOWER EXTREMITY;  Surgeon: Conrad [place], MD;  Location: [facility] CV LAB;  Service: Cardiovascular;  Laterality: N/A;  . CERVICAL DISC SURGERY    . COLONOSCOPY WITH PROPOFOL N/A 02/17/2017   Procedure: COLONOSCOPY WITH PROPOFOL;  Surgeon: Daneil Dolin, MD;  Location: [facility];  Service: Endoscopy;  Laterality: N/A;  2:45pm  . FEMORAL-POPLITEAL BYPASS GRAFT  11/06/2005  . ILIAC ARTERY STENT  10-03-11   Right CIA stenting  . PERIPHERAL VASCULAR BALLOON ANGIOPLASTY Right 01/16/2017   Procedure: PERIPHERAL VASCULAR BALLOON ANGIOPLASTY;  Surgeon: Conrad Arbuckle, MD;  Location: [facility] CV LAB;  Service: Cardiovascular;  Laterality: Right;  common iliac   . PR VEIN BYPASS GRAFT,AORTO-FEM-POP  06-22-2010   Redo Left Fem-Pop  . Repair of left arm fracture    . 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 SURGERY SCROTAL / TESTICULAR      Family History  Problem Relation Age of Onset  . Heart attack Mother 73  . Deep vein thrombosis Mother   . Diabetes Mother   . Hyperlipidemia Mother   . Hypertension Mother   . Peripheral vascular disease Mother   . Stroke Father   . Diabetes Father   . Hyperlipidemia Father   . Hypertension Father   . Peripheral vascular disease Father   . Diabetes Sister   .  Hyperlipidemia Sister   . Hypertension Sister   . Diabetes Brother   . Hyperlipidemia Brother   . Cancer Brother   . Deep vein thrombosis Brother   . Hypertension Brother   . Peripheral vascular disease Brother   . Hyperlipidemia Son   . Cancer Son   . Hypertension Son   . Colon cancer Neg Hx     SOCIAL HISTORY: Social History   Tobacco Use  . Smoking status: Former Smoker    Packs/day: 1.00    Years: 45.00    Pack years: 45.00    Types: Cigarettes, Cigars    Last attempt to quit: 09/01/2011    Years since quitting: 6.2  . Smokeless tobacco: Never Used  Substance Use Topics  . Alcohol use: No    Alcohol/week: 0.0 standard drinks     Frequency: Never    No Known Allergies  Current Outpatient Medications  Medication Sig Dispense Refill  . atorvastatin (LIPITOR) 20 MG tablet Take 20 mg by mouth daily.    . budesonide-formoterol (SYMBICORT) 160-4.5 MCG/ACT inhaler Inhale 2 puffs into the lungs 2 (two) times daily. 1 Inhaler 11  . celecoxib (CELEBREX) 200 MG capsule Take 1 capsule (200 mg total) by mouth daily. (HOLD X 5 DAYS WHILE TAKING THE IBUPROFEN) (Patient taking differently: Take 200 mg by mouth daily. )    . clopidogrel (PLAVIX) 75 MG tablet Take 1 tablet (75 mg total) by mouth daily. 30 tablet 11  . Dexlansoprazole (DEXILANT) 30 MG capsule Take 30- 60 min before your first and last meals of the day (Patient taking differently: Take 30 mg by mouth daily. ) 60 capsule 11  . docusate sodium (COLACE) 100 MG capsule Take 100 mg by mouth daily.    Marland Kitchen glipiZIDE (GLUCOTROL) 5 MG tablet Take by mouth.    Marland Kitchen HYDROcodone-acetaminophen (NORCO) 7.5-325 MG per tablet Take 1 tablet by mouth 2 (two) times daily as needed for moderate pain.     Marland Kitchen JANUVIA 100 MG tablet Take 100 mg by mouth daily.     Marland Kitchen ketoconazole (NIZORAL) 2 % cream Apply to both feet once daily for 4 weeks 30 g 0  . losartan (COZAAR) 50 MG tablet Take 2 each am (Patient taking differently: Take 50 mg by mouth daily. Take 2 each am) 60 tablet 11  . metFORMIN (GLUCOPHAGE) 1000 MG tablet TK 1 T PO BID  3  . ONE TOUCH ULTRA TEST test strip     . PROAIR HFA 108 (90 BASE) MCG/ACT inhaler Inhale 2 puffs into the lungs every 6 (six) hours as needed for wheezing or shortness of breath.     Loura Pardon Salicylate (ASPERCREME EX) Apply topically as needed.    . vitamin C (ASCORBIC ACID) 500 MG tablet Take 500 mg by mouth daily.    . VOLTAREN 1 % GEL Apply 1 application topically daily as needed (pain).     Morrie Sheldon 5 MG TABS TK 1 T PO BID  2  . metFORMIN (GLUCOPHAGE) 500 MG tablet Take 500 mg by mouth 2 (two) times daily with a meal.     . nitroGLYCERIN (NITROSTAT) 0.4 MG SL  tablet Place 1 tablet (0.4 mg total) under the tongue every 5 (five) minutes as needed for chest pain. (Patient not taking: Reported on 11/25/2017) 90 tablet 3   No current facility-administered medications for this visit.     REVIEW OF SYSTEMS:  [X]  denotes positive finding, [ ]  denotes negative finding Cardiac  Comments:  Chest pain or chest pressure:    Shortness of breath upon exertion:    Short of breath when lying flat:    Irregular heart rhythm:        Vascular    Pain in calf, thigh, or hip brought on by ambulation:    Pain in feet at night that wakes you up from your sleep:     Blood clot in your veins:    Leg swelling:         Pulmonary    Oxygen at home:    Productive cough:     Wheezing:         Neurologic    Sudden weakness in arms or legs:     Sudden numbness in arms or legs:     Sudden onset of difficulty speaking or slurred speech:    Temporary loss of vision in one eye:     Problems with dizziness:         Gastrointestinal    Blood in stool:     Vomited blood:         Genitourinary    Burning when urinating:     Blood in urine:        Psychiatric    Major depression:         Hematologic    Bleeding problems:    Problems with blood clotting too easily:        Skin    Rashes or ulcers:        Constitutional    Fever or chills:      PHYSICAL EXAM: Vitals:   11/25/17 1131  BP: (!) 152/97  Pulse: 89  Resp: 18  Temp: (!) 97.5 F (36.4 C)  TempSrc: Oral  SpO2: 96%  Weight: 79.4 kg  Height: 5\' 10"  (1.778 m)    GENERAL: The patient is a well-nourished male, in no acute distress. The vital signs are documented above. CARDIAC: There is a regular rate and rhythm.  VASCULAR:  2+ femoral pulse palpable bilatearal groins 2+ right DP palpable 1+ left DP palpable, monophasic left PT signal PULMONARY: There is good air exchange bilaterally without wheezing or rales. ABDOMEN: Soft and non-tender with normal pitched bowel sounds.    MUSCULOSKELETAL: There are no major deformities or cyanosis. NEUROLOGIC: No focal weakness or paresthesias are detected. SKIN: There are no ulcers or rashes noted. PSYCHIATRIC: The patient has a normal affect.  DATA:   I independently reviewed his noninvasive imaging and his left femoral to above-knee popliteal bypass appears widely patent with a triphasic waveform and no stenosis.  His right common iliac stent appears widely patent.  His ABIs are noncompressible but he has adequate toe pressures bilaterally >100.  Assessment/Plan:  Overall I think [name] is doing well from a vascular standpoint.  His right common iliac stent as well as his left femoral to above-knee pop bypass are widely patent.  He has a palpable right dorsalis pedis pulse and a weakly palpable left dorsalis pedis pulse.  I think the symptoms he is having in his left leg are a little hard to sort out given the inconsistency.  Given that all of his vascular studies are normal today I do not feel strongly about intervention at this time.  He is very concerned about the future of his bypass given that he has had a redo bypass on the left so I offered him a short interval follow-up in 3 months just to keep an eye on everything with  repeat ABI and bypass duplex.   Marty Heck, MD [facility] Office: [phone] Pager: [place]

## 2017-11-27 ENCOUNTER — Ambulatory Visit: Payer: Medicare Other | Admitting: Physician Assistant

## 2017-11-28 ENCOUNTER — Ambulatory Visit
Admission: RE | Admit: 2017-11-28 | Discharge: 2017-11-28 | Disposition: A | Discharge status: Home / Self Care | Payer: Medicare Other | Source: Ambulatory Visit | Attending: Nurse Practitioner | Admitting: Nurse Practitioner

## 2017-11-28 DIAGNOSIS — M47812 Spondylosis without myelopathy or radiculopathy, cervical region: Secondary | ICD-10-CM

## 2017-11-28 MED ORDER — IOPAMIDOL (ISOVUE-M 300) INJECTION 61%: 1.0000 mL | Freq: Once | INTRAMUSCULAR | Status: DC | PRN

## 2017-11-28 NOTE — Discharge Instructions (Signed)

## 2017-12-09 ENCOUNTER — Ambulatory Visit: Payer: Medicare Other | Admitting: Internal Medicine

## 2017-12-09 ENCOUNTER — Encounter: Payer: Self-pay | Admitting: Physician Assistant

## 2017-12-09 ENCOUNTER — Ambulatory Visit (INDEPENDENT_AMBULATORY_CARE_PROVIDER_SITE_OTHER): Payer: Medicare Other | Admitting: Rheumatology

## 2017-12-09 VITALS — BP 124/81 | HR 111 | Resp 15 | Ht 70.0 in | Wt 180.0 lb

## 2017-12-09 DIAGNOSIS — M67432 Ganglion, left wrist: Secondary | ICD-10-CM

## 2017-12-09 DIAGNOSIS — I739 Peripheral vascular disease, unspecified: Secondary | ICD-10-CM

## 2017-12-09 DIAGNOSIS — E1122 Type 2 diabetes mellitus with diabetic chronic kidney disease: Secondary | ICD-10-CM

## 2017-12-09 DIAGNOSIS — M0609 Rheumatoid arthritis without rheumatoid factor, multiple sites: Secondary | ICD-10-CM

## 2017-12-09 DIAGNOSIS — E782 Mixed hyperlipidemia: Secondary | ICD-10-CM

## 2017-12-09 DIAGNOSIS — R748 Abnormal levels of other serum enzymes: Secondary | ICD-10-CM

## 2017-12-09 DIAGNOSIS — M19041 Primary osteoarthritis, right hand: Secondary | ICD-10-CM | POA: Diagnosis not present

## 2017-12-09 DIAGNOSIS — Z79899 Other long term (current) drug therapy: Secondary | ICD-10-CM

## 2017-12-09 DIAGNOSIS — I2510 Atherosclerotic heart disease of native coronary artery without angina pectoris: Secondary | ICD-10-CM

## 2017-12-09 DIAGNOSIS — M5030 Other cervical disc degeneration, unspecified cervical region: Secondary | ICD-10-CM

## 2017-12-09 DIAGNOSIS — M24522 Contracture, left elbow: Secondary | ICD-10-CM

## 2017-12-09 DIAGNOSIS — M19072 Primary osteoarthritis, left ankle and foot: Secondary | ICD-10-CM

## 2017-12-09 DIAGNOSIS — M75101 Unspecified rotator cuff tear or rupture of right shoulder, not specified as traumatic: Secondary | ICD-10-CM

## 2017-12-09 DIAGNOSIS — M19042 Primary osteoarthritis, left hand: Secondary | ICD-10-CM

## 2017-12-09 DIAGNOSIS — I10 Essential (primary) hypertension: Secondary | ICD-10-CM

## 2017-12-09 DIAGNOSIS — M19071 Primary osteoarthritis, right ankle and foot: Secondary | ICD-10-CM

## 2017-12-09 DIAGNOSIS — J449 Chronic obstructive pulmonary disease, unspecified: Secondary | ICD-10-CM

## 2017-12-09 DIAGNOSIS — R252 Cramp and spasm: Secondary | ICD-10-CM

## 2017-12-09 DIAGNOSIS — Z9889 Other specified postprocedural states: Secondary | ICD-10-CM

## 2017-12-09 DIAGNOSIS — N183 Chronic kidney disease, stage 3 unspecified: Secondary | ICD-10-CM

## 2017-12-09 MED ORDER — XELJANZ 5 MG PO TABS: ORAL_TABLET | ORAL | 0 refills | Status: DC

## 2017-12-09 NOTE — Patient Instructions (Signed)
Standing Labs We placed an order today for your standing lab work.    Please come back and get your standing labs in March and every 3 months  We have open lab Monday through Friday from 8:30-11:30 AM and 1:30-4:00 PM  at the office of Dr. Shaili Deveshwar.   You may experience shorter wait times on Monday and Friday afternoons. The office is located at [address], [address], [place], [place] [address] No appointment is necessary.   Labs are drawn by [facility].  You may receive a bill from [facility] for your lab work. If you have any questions regarding directions or hours of operation,  please call [phone].   Just as a reminder please drink plenty of water prior to coming for your lab work. Thanks!  

## 2017-12-10 ENCOUNTER — Other Ambulatory Visit: Payer: Self-pay | Admitting: Nurse Practitioner

## 2017-12-10 ENCOUNTER — Ambulatory Visit: Admission: RE | Admit: 2017-12-10 | Payer: Medicare Other | Source: Ambulatory Visit

## 2017-12-10 ENCOUNTER — Ambulatory Visit
Admission: RE | Admit: 2017-12-10 | Discharge: 2017-12-10 | Disposition: A | Discharge status: Home / Self Care | Payer: Medicare Other | Source: Ambulatory Visit | Attending: Nurse Practitioner | Admitting: Nurse Practitioner

## 2017-12-10 DIAGNOSIS — M47812 Spondylosis without myelopathy or radiculopathy, cervical region: Secondary | ICD-10-CM

## 2017-12-10 LAB — COMPLETE METABOLIC PANEL WITH GFR
AG Ratio: 1.8 (calc) (ref 1.0–2.5)
ALT: 16 U/L (ref 9–46)
AST: 16 U/L (ref 10–35)
Albumin: 4.3 g/dL (ref 3.6–5.1)
Alkaline phosphatase (APISO): 75 U/L (ref 40–115)
BUN: 15 mg/dL (ref 7–25)
CO2: 26 mmol/L (ref 20–32)
Calcium: 9.2 mg/dL (ref 8.6–10.3)
Chloride: 103 mmol/L (ref 98–110)
Creat: 0.87 mg/dL (ref 0.70–1.25)
GFR, Est African American: 104 mL/min/{1.73_m2} (ref >=60)
GFR, Est Non African American: 90 mL/min/{1.73_m2} (ref >=60)
Globulin: 2.4 g/dL (calc) (ref 1.9–3.7)
Glucose, Bld: 305 mg/dL — ABNORMAL HIGH (ref 65–99)
Potassium: 4.1 mmol/L (ref 3.5–5.3)
Sodium: 138 mmol/L (ref 135–146)
Total Bilirubin: 0.5 mg/dL (ref 0.2–1.2)
Total Protein: 6.7 g/dL (ref 6.1–8.1)

## 2017-12-10 LAB — CBC WITH DIFFERENTIAL/PLATELET
Basophils Absolute: 30 cells/uL (ref 0–200)
Basophils Relative: 0.5 %
Eosinophils Absolute: 78 cells/uL (ref 15–500)
Eosinophils Relative: 1.3 %
HCT: 37.8 % — ABNORMAL LOW (ref 38.5–50.0)
Hemoglobin: 12.4 g/dL — ABNORMAL LOW (ref 13.2–17.1)
Lymphs Abs: 1452 cells/uL (ref 850–3900)
MCH: 28.9 pg (ref 27.0–33.0)
MCHC: 32.8 g/dL (ref 32.0–36.0)
MCV: 88.1 fL (ref 80.0–100.0)
MPV: 10.6 fL (ref 7.5–12.5)
Monocytes Relative: 6.4 %
Neutro Abs: 4056 cells/uL (ref 1500–7800)
Neutrophils Relative %: 67.6 %
Platelets: 267 10*3/uL (ref 140–400)
RBC: 4.29 10*6/uL (ref 4.20–5.80)
RDW: 13.6 % (ref 11.0–15.0)
Total Lymphocyte: 24.2 %
WBC mixed population: 384 cells/uL (ref 200–950)
WBC: 6 10*3/uL (ref 3.8–10.8)

## 2017-12-10 LAB — CK: Total CK: 254 U/L — ABNORMAL HIGH (ref 44–196)

## 2017-12-10 MED ORDER — DEXAMETHASONE SODIUM PHOSPHATE 4 MG/ML IJ SOLN
10.0000 mg | Freq: Once | INTRAMUSCULAR | Status: AC
  Administered 2017-12-10: 10 mg via INTRA_ARTICULAR

## 2017-12-10 MED ORDER — IOPAMIDOL (ISOVUE-M 300) INJECTION 61%
1.0000 mL | Freq: Once | INTRAMUSCULAR | Status: AC | PRN
  Administered 2017-12-10: 1 mL via INTRA_ARTICULAR

## 2017-12-10 NOTE — Progress Notes (Signed)
Hgb and Hct are mildly low.  Rest of CBC WNL. Glucose is very elevated-305. Please notify patient and forward labs to PCP. CK is elevated but is trending down. Please advise patient to notify us if he develops worsening muscle tenderness or weakness.

## 2017-12-10 NOTE — Discharge Instructions (Signed)

## 2017-12-12 ENCOUNTER — Ambulatory Visit (INDEPENDENT_AMBULATORY_CARE_PROVIDER_SITE_OTHER): Payer: Medicare Other | Admitting: Podiatry

## 2017-12-12 ENCOUNTER — Encounter: Payer: Self-pay | Admitting: Podiatry

## 2017-12-12 DIAGNOSIS — B351 Tinea unguium: Secondary | ICD-10-CM | POA: Diagnosis not present

## 2017-12-12 DIAGNOSIS — M79675 Pain in left toe(s): Secondary | ICD-10-CM

## 2017-12-12 DIAGNOSIS — L84 Corns and callosities: Secondary | ICD-10-CM

## 2017-12-12 DIAGNOSIS — M79674 Pain in right toe(s): Secondary | ICD-10-CM | POA: Diagnosis not present

## 2017-12-12 DIAGNOSIS — E1151 Type 2 diabetes mellitus with diabetic peripheral angiopathy without gangrene: Secondary | ICD-10-CM

## 2017-12-12 DIAGNOSIS — B353 Tinea pedis: Secondary | ICD-10-CM

## 2017-12-12 MED ORDER — KETOCONAZOLE 2 % EX CREA: TOPICAL_CREAM | CUTANEOUS | 0 refills | Status: DC

## 2017-12-12 NOTE — Patient Instructions (Signed)

## 2018-01-11 NOTE — Progress Notes (Signed)
Subjective: [name] presents today with painful, thick toenails 1-5 b/l that he cannot cut and which interfere with daily activities.  Pain is aggravated when wearing enclosed shoe gear.  [name], MD is his PCP.   Current Outpatient Medications:  .  atorvastatin (LIPITOR) 20 MG tablet, Take 20 mg by mouth daily., Disp: , Rfl:  .  budesonide-formoterol (SYMBICORT) 160-4.5 MCG/ACT inhaler, Inhale 2 puffs into the lungs 2 (two) times daily., Disp: 1 Inhaler, Rfl: 11 .  celecoxib (CELEBREX) 200 MG capsule, Take 1 capsule (200 mg total) by mouth daily. (HOLD X 5 DAYS WHILE TAKING THE IBUPROFEN) (Patient taking differently: Take 200 mg by mouth daily. ), Disp: , Rfl:  .  clopidogrel (PLAVIX) 75 MG tablet, Take 1 tablet (75 mg total) by mouth daily., Disp: 30 tablet, Rfl: 11 .  Dexlansoprazole (DEXILANT) 30 MG capsule, Take 30- 60 min before your first and last meals of the day (Patient taking differently: Take 30 mg by mouth daily. ), Disp: 60 capsule, Rfl: 11 .  docusate sodium (COLACE) 100 MG capsule, Take 100 mg by mouth as needed. , Disp: , Rfl:  .  glipiZIDE (GLUCOTROL) 5 MG tablet, Take by mouth., Disp: , Rfl:  .  HYDROcodone-acetaminophen (NORCO) 7.5-325 MG per tablet, Take 1 tablet by mouth 2 (two) times daily as needed for moderate pain. , Disp: , Rfl:  .  JANUVIA 100 MG tablet, Take 100 mg by mouth daily. , Disp: , Rfl:  .  ketoconazole (NIZORAL) 2 % cream, Apply to both feet once daily for 4 weeks, Disp: 30 g, Rfl: 0 .  losartan (COZAAR) 50 MG tablet, Take 2 each am (Patient taking differently: Take 50 mg by mouth daily. Take 2 each am), Disp: 60 tablet, Rfl: 11 .  metFORMIN (GLUCOPHAGE) 1000 MG tablet, TK 1 T PO BID, Disp: , Rfl: 3 .  metFORMIN (GLUCOPHAGE) 500 MG tablet, Take 500 mg by mouth 2 (two) times daily with a meal. , Disp: , Rfl:  .  nitroGLYCERIN (NITROSTAT) 0.4 MG SL tablet, Place 1 tablet (0.4 mg total) under the tongue every 5 (five) minutes as needed for chest  pain., Disp: 90 tablet, Rfl: 3 .  ONE TOUCH ULTRA TEST test strip, , Disp: , Rfl:  .  PROAIR HFA 108 (90 BASE) MCG/ACT inhaler, Inhale 2 puffs into the lungs every 6 (six) hours as needed for wheezing or shortness of breath. , Disp: , Rfl:  .  Trolamine Salicylate (ASPERCREME EX), Apply topically as needed., Disp: , Rfl:  .  vitamin C (ASCORBIC ACID) 500 MG tablet, Take 500 mg by mouth daily., Disp: , Rfl:  .  VOLTAREN 1 % GEL, Apply 1 application topically daily as needed (pain). , Disp: , Rfl:  .  XELJANZ 5 MG TABS, Take 1 tablet by mouth daily., Disp: 90 tablet, Rfl: 0  No Known Allergies  Objective:  Vascular Examination: Capillary refill time <3 seconds  x 10 digits Dorsalis pedis absent left foot; diminished right foot Posterior tibial pulses nonpalpable b/l Digital hair absent  x 10 digits Skin temperature gradient WNL b/l  Dermatological Examination: Skin with normal turgor, texture and tone b/l  Toenails 1-5 b/l discolored, thick, dystrophic with subungual debris and pain with palpation to nailbeds due to thickness of nails.  Improvement in tinea pedis, but residual scaling remains on the left foot.   Hyperkeratotic lesion plantarmedial hallux left foot  Musculoskeletal: Muscle strength 5/5 to all LE muscle groups  Severe HAV  with bunion b/l  Contracted digits 2-5 b/l  Neurological: Sensation intact with 10 gram monofilament.  Assessment: 1. Painful onychomycosis toenails 1-5 b/l  2. Callus left hallux 3. NIDDM with PAD  Plan: 1. Toenails 1-5 b/l were debrided in length and girth without iatrogenic bleeding. 2. Callus pared left hallux IPJ. 3. For tinea pedis, refill sent to pharmacy 4. Patient to continue soft, supportive shoe gear 5. Patient to report any pedal injuries to medical professional immediately. 6. Follow up 3 months. Patient/POA to call should there be a concern in the interim.

## 2018-01-16 ENCOUNTER — Other Ambulatory Visit: Payer: Self-pay

## 2018-01-16 DIAGNOSIS — I779 Disorder of arteries and arterioles, unspecified: Secondary | ICD-10-CM

## 2018-02-09 ENCOUNTER — Telehealth: Payer: Self-pay | Admitting: Rheumatology

## 2018-02-09 NOTE — Telephone Encounter (Signed)
Patient called requesting a return call to discuss his lab results from 12/09/17 again because he couldn't remember which tests came back "abnormal."  Patient states he had appointment with Dr. [name] who requested a copy of the results.

## 2018-02-09 NOTE — Telephone Encounter (Signed)
Reviewed Results with patient again. Patient advised notify us if he develops worsening muscle tenderness or weakness. Patient verbalized understanding.

## 2018-02-25 ENCOUNTER — Encounter (HOSPITAL_COMMUNITY): Payer: Medicare Other

## 2018-02-25 ENCOUNTER — Other Ambulatory Visit (HOSPITAL_COMMUNITY): Payer: Medicare Other

## 2018-02-25 ENCOUNTER — Ambulatory Visit: Payer: Medicare Other | Admitting: Family

## 2018-02-25 NOTE — Progress Notes (Signed)
HISTORY AND PHYSICAL     CC:  follow up. Requesting Provider:  [name], MD  HPI: This is a 68 y.o. male who is here today for follow up.  He was last seen by Dr. Carlis Abbott on 11/25/17.  At that time, he was having some new discomfort in his left leg. He has a complex history including a previous left common femoral to AK pop bypass with propaten in 2007 by Dr. Kellie Simmering and then a redo left common femoral to AK pop bypass with propaten in 2012.  In addition he has undergone a subintimal angioplasty of his right common iliac artery in 2013 with a recent drug-coated balloon angioplasty in 2019 by Dr. Bridgett Larsson.  Feels like his right leg is doing well and not giving him any problems.  When asked about the pain in his left leg it is a little bit difficult to sort out.  He states at times he has some pain in the back of his calf but then other times states it is along his shin sometimes it happens when he walks other times states he can walk and exercise for long periods of time with no discomfort.   Dr. Carlis Abbott felt he was doing well overall from a vascular standpoint.  His left fem pop bypass was widely patent and had a palpable right DP pulse and weakly palpable left DP pulse.  Dr. Carlis Abbott felt given that all of his vascular studies are normal today I do not feel strongly about intervention at this time.  He is very concerned about the future of his bypass given that he has had a redo bypass on the left so I offered him a short interval follow-up in 3 months just to keep an eye on everything with repeat ABI and bypass duplex.    The pt returns today for follow up.  He states he is doing well.  He does have complaints of left calf pain but not really with walking. He states he gets this at night.  He states that he goes to the gym and walks on the treadmill and uses the bike.  He states if he walks too much, he loses weight.  After talking with him, it seems this is his limiting factor on walking and not  claudication.  He denies rest pain and he does not have non healing wounds on his feet.   He worries about his bypass bc he has several family members that have had amputations and he is trying to avoid this.    The pt is on a statin for cholesterol management.    The pt is have diabetes. The pt is on ARB for hypertension.  The pt is not on an aspirin.  Tobacco hx:  remote Other AC:  Plavix  Past Medical History:  Diagnosis Date  . Arthritis    RA  . Bronchitis   . Carotid artery occlusion   . Cervical spine disease    Neck surgery in 2007 with Dr. Joya Salm  . COPD (chronic obstructive pulmonary disease) ([place])   . Coronary atherosclerosis of native coronary artery    Nonobstrtuctive by catheterization 2007 and 2013  . DVT (deep venous thrombosis) ([place])   . Essential hypertension, benign   . GERD (gastroesophageal reflux disease)   . Hyperlipidemia   . PAD (peripheral artery disease) ([place])    s/p left fem-pop bypass 2007; PTA and stenting of right iliac artery 09/2011  . Rheumatoid arthritis(714.0)   . Type 2  diabetes mellitus ([place])     Past Surgical History:  Procedure Laterality Date  . ABDOMINAL AORTAGRAM N/A 10/03/2011   Procedure: ABDOMINAL Maxcine Ham;  Surgeon: Conrad [place], MD;  Location: [facility] CATH LAB;  Service: Cardiovascular;  Laterality: N/A;  . ABDOMINAL AORTOGRAM W/LOWER EXTREMITY N/A 01/16/2017   Procedure: ABDOMINAL AORTOGRAM W/LOWER EXTREMITY;  Surgeon: Conrad Pendleton, MD;  Location: [place] CV LAB;  Service: Cardiovascular;  Laterality: N/A;  . CERVICAL DISC SURGERY    . COLONOSCOPY WITH PROPOFOL N/A 02/17/2017   Procedure: COLONOSCOPY WITH PROPOFOL;  Surgeon: Daneil Dolin, MD;  Location: [facility];  Service: Endoscopy;  Laterality: N/A;  2:45pm  . FEMORAL-POPLITEAL BYPASS GRAFT  11/06/2005  . ILIAC ARTERY STENT  10-03-11   Right CIA stenting  . PERIPHERAL VASCULAR BALLOON ANGIOPLASTY Right 01/16/2017   Procedure: PERIPHERAL VASCULAR BALLOON ANGIOPLASTY;   Surgeon: Conrad [place], MD;  Location: [facility] CV LAB;  Service: Cardiovascular;  Laterality: Right;  common iliac   . PR VEIN BYPASS GRAFT,AORTO-FEM-POP  06-22-2010   Redo Left Fem-Pop  . Repair of left arm fracture    . 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 SURGERY SCROTAL / TESTICULAR      No Known Allergies  Current Outpatient Medications  Medication Sig Dispense Refill  . atorvastatin (LIPITOR) 20 MG tablet Take 20 mg by mouth daily.    . budesonide-formoterol (SYMBICORT) 160-4.5 MCG/ACT inhaler Inhale 2 puffs into the lungs 2 (two) times daily. 1 Inhaler 11  . celecoxib (CELEBREX) 200 MG capsule Take 1 capsule (200 mg total) by mouth daily. (HOLD X 5 DAYS WHILE TAKING THE IBUPROFEN) (Patient taking differently: Take 200 mg by mouth daily. )    . clopidogrel (PLAVIX) 75 MG tablet Take 1 tablet (75 mg total) by mouth daily. 30 tablet 11  . Dexlansoprazole (DEXILANT) 30 MG capsule Take 30- 60 min before your first and last meals of the day (Patient taking differently: Take 30 mg by mouth daily. ) 60 capsule 11  . docusate sodium (COLACE) 100 MG capsule Take 100 mg by mouth as needed.     Marland Kitchen glipiZIDE (GLUCOTROL) 5 MG tablet Take by mouth.    Marland Kitchen HYDROcodone-acetaminophen (NORCO) 7.5-325 MG per tablet Take 1 tablet by mouth 2 (two) times daily as needed for moderate pain.     Marland Kitchen JANUVIA 100 MG tablet Take 100 mg by mouth daily.     Marland Kitchen ketoconazole (NIZORAL) 2 % cream Apply to both feet once daily for 4 weeks 30 g 0  . losartan (COZAAR) 50 MG tablet Take 2 each am (Patient taking differently: Take 50 mg by mouth daily. Take 2 each am) 60 tablet 11  . metFORMIN (GLUCOPHAGE) 1000 MG tablet TK 1 T PO BID  3  . metFORMIN (GLUCOPHAGE) 500 MG tablet Take 500 mg by mouth 2 (two) times daily with a meal.     . nitroGLYCERIN (NITROSTAT) 0.4 MG SL tablet Place 1 tablet (0.4 mg total) under the tongue every 5 (five) minutes as needed for chest pain. 90 tablet 3  . ONE TOUCH ULTRA TEST test strip     .  PROAIR HFA 108 (90 BASE) MCG/ACT inhaler Inhale 2 puffs into the lungs every 6 (six) hours as needed for wheezing or shortness of breath.     Loura Pardon Salicylate (ASPERCREME EX) Apply topically as needed.    . vitamin C (ASCORBIC ACID) 500 MG tablet Take 500 mg by mouth daily.    Marland Kitchen  VOLTAREN 1 % GEL Apply 1 application topically daily as needed (pain).     Morrie Sheldon 5 MG TABS Take 1 tablet by mouth daily. 90 tablet 0   No current facility-administered medications for this visit.     Family History  Problem Relation Age of Onset  . Heart attack Mother 25  . Deep vein thrombosis Mother   . Diabetes Mother   . Hyperlipidemia Mother   . Hypertension Mother   . Peripheral vascular disease Mother   . Stroke Father   . Diabetes Father   . Hyperlipidemia Father   . Hypertension Father   . Peripheral vascular disease Father   . Diabetes Sister   . Hyperlipidemia Sister   . Hypertension Sister   . Diabetes Brother   . Hyperlipidemia Brother   . Cancer Brother   . Deep vein thrombosis Brother   . Hypertension Brother   . Peripheral vascular disease Brother   . Hyperlipidemia Son   . Cancer Son   . Hypertension Son   . Colon cancer Neg Hx     Social History   Socioeconomic History  . Marital status: Single    Spouse name: Not on file  . Number of children: Not on file  . Years of education: Not on file  . Highest education level: Not on file  Occupational History  . Not on file  Social Needs  . Financial resource strain: Not on file  . Food insecurity:    Worry: Not on file    Inability: Not on file  . Transportation needs:    Medical: Not on file    Non-medical: Not on file  Tobacco Use  . Smoking status: Former Smoker    Packs/day: 1.00    Years: 45.00    Pack years: 45.00    Types: Cigarettes, Cigars    Last attempt to quit: 09/01/2011    Years since quitting: 6.4  . Smokeless tobacco: Never Used  Substance and Sexual Activity  . Alcohol use: No     Alcohol/week: 0.0 standard drinks    Frequency: Never  . Drug use: No  . Sexual activity: Not Currently    Birth control/protection: None  Lifestyle  . Physical activity:    Days per week: Not on file    Minutes per session: Not on file  . Stress: Not on file  Relationships  . Social connections:    Talks on phone: Not on file    Gets together: Not on file    Attends religious service: Not on file    Active member of club or organization: Not on file    Attends meetings of clubs or organizations: Not on file    Relationship status: Not on file  . Intimate partner violence:    Fear of current or ex partner: Not on file    Emotionally abused: Not on file    Physically abused: Not on file    Forced sexual activity: Not on file  Other Topics Concern  . Not on file  Social History Narrative   The patient lives in [place].      REVIEW OF SYSTEMS:   [X]  denotes positive finding, [ ]  denotes negative finding Cardiac  Comments:  Chest pain or chest pressure:    Shortness of breath upon exertion:    Short of breath when lying flat:    Irregular heart rhythm:        Vascular    Pain in calf, thigh, or  hip brought on by ambulation:    Pain in feet at night that wakes you up from your sleep:     Blood clot in your veins:    Leg swelling:         Pulmonary    Oxygen at home:    Productive cough:     Wheezing:         Neurologic    Sudden weakness in arms or legs:     Sudden numbness in arms or legs:     Sudden onset of difficulty speaking or slurred speech:    Temporary loss of vision in one eye:     Problems with dizziness:         Gastrointestinal    Blood in stool:     Vomited blood:         Genitourinary    Burning when urinating:     Blood in urine:        Psychiatric    Major depression:         Hematologic    Bleeding problems:    Problems with blood clotting too easily:        Skin    Rashes or ulcers:        Constitutional    Fever or chills:       PHYSICAL EXAMINATION:  Today's Vitals   02/26/18 0955  BP: (!) 147/96  Pulse: 83  Resp: 14  Temp: (!) 97.2 F (36.2 C)  TempSrc: Oral  SpO2: 97%  Weight: 177 lb 0.5 oz (80.3 kg)  Height: 5\' 10"  (1.778 m)  PainSc: 5    Body mass index is 25.4 kg/m.   General:  WDWN in NAD; vital signs documented above Gait: Not observed HENT: WNL, normocephalic Pulmonary: normal non-labored breathing , without Rales, rhonchi,  wheezing Cardiac: regular HR, without  Murmurs; without carotid bruits Abdomen: soft, NT, no masses Skin: without rashes Vascular Exam/Pulses:  Right Left  Radial 2+ (normal) 2+ (normal)  Ulnar Unable to palpate  Unable to palpate   Femoral 2+ (normal) 2+ (normal)  Popliteal Unable to palpate  Unable to palpate   DP 2+ (normal) monophasic  PT Unable to palpate  Faint monophasic   Extremities: without ischemic changes, without Gangrene , without cellulitis; without open wounds; left peroneal doppler signal monophasic; motor and sensation are in tact.  Musculoskeletal: no muscle wasting or atrophy  Neurologic: A&O X 3;  No focal weakness or paresthesias are detected Psychiatric:  The pt has Normal affect.   Non-Invasive Vascular Imaging:   ABI's/TBI's on 02/26/2018: Right:  [place]/0.64 Left:  1.1/0.56  Arterial duplex on 02/26/2018: Patent with biphasic waveforms  Previous ABI's/TBI's on 11/25/17: Right:  [place]/0.78 Left:  1.18/0.85  Arterial duplex 11/25/17: Left Graft(s): Femoral to above knee popliteaal bypass graft patent with no evidence of stenosis noted   ASSESSMENT/PLAN:: 68 y.o. male  With hx ofleft common femoral to AK pop bypass with propaten in 2007 by Dr. Kellie Simmering and then a redo left common femoral to AK pop bypass with propaten in 2012.  In addition he has undergone a subintimal angioplasty of his right common iliac artery in 2013 with a recent drug-coated balloon angioplasty in 2019 by Dr. Bridgett Larsson  -pt doing well and does not have non healing  wounds or rest pain.  Calf pain seems to be at night and not with walking.   -left bypass graft is patent with biphasic waveforms.  -encouraged him to continue going to the  gym and his walking program and staying active.   -continue Plavix/statin -f/u in 3 months with repeat ABI/LLE arterial duplex  -he knows to call sooner should he have any issues.    Leontine Locket, PA-C [facility] [phone]  Clinic MD:   Oneida Alar

## 2018-02-26 ENCOUNTER — Encounter: Payer: Self-pay | Admitting: Family

## 2018-02-26 ENCOUNTER — Ambulatory Visit (HOSPITAL_COMMUNITY)
Admission: RE | Admit: 2018-02-26 | Discharge: 2018-02-26 | Disposition: A | Discharge status: Home / Self Care | Payer: Medicare Other | Source: Ambulatory Visit | Attending: Vascular Surgery | Admitting: Vascular Surgery

## 2018-02-26 ENCOUNTER — Ambulatory Visit (INDEPENDENT_AMBULATORY_CARE_PROVIDER_SITE_OTHER): Payer: Medicare Other | Admitting: Physician Assistant

## 2018-02-26 ENCOUNTER — Ambulatory Visit (INDEPENDENT_AMBULATORY_CARE_PROVIDER_SITE_OTHER)
Admission: RE | Admit: 2018-02-26 | Discharge: 2018-02-26 | Disposition: A | Discharge status: Home / Self Care | Payer: Medicare Other | Source: Ambulatory Visit | Attending: Vascular Surgery | Admitting: Vascular Surgery

## 2018-02-26 ENCOUNTER — Other Ambulatory Visit: Payer: Self-pay

## 2018-02-26 VITALS — BP 147/96 | HR 83 | Temp 97.2°F | Resp 14 | Ht 70.0 in | Wt 177.0 lb

## 2018-02-26 DIAGNOSIS — I779 Disorder of arteries and arterioles, unspecified: Secondary | ICD-10-CM | POA: Insufficient documentation

## 2018-02-27 ENCOUNTER — Other Ambulatory Visit: Payer: Self-pay | Admitting: Neurological Surgery

## 2018-02-27 DIAGNOSIS — M542 Cervicalgia: Secondary | ICD-10-CM

## 2018-03-10 ENCOUNTER — Other Ambulatory Visit: Payer: Self-pay | Admitting: Neurological Surgery

## 2018-03-10 ENCOUNTER — Ambulatory Visit
Admission: RE | Admit: 2018-03-10 | Discharge: 2018-03-10 | Disposition: A | Discharge status: Home / Self Care | Payer: Medicare Other | Source: Ambulatory Visit | Attending: Neurological Surgery | Admitting: Neurological Surgery

## 2018-03-10 DIAGNOSIS — M542 Cervicalgia: Secondary | ICD-10-CM

## 2018-03-10 MED ORDER — IOHEXOL 300 MG/ML  SOLN: 1.0000 mL | Freq: Once | INTRAMUSCULAR | Status: DC | PRN

## 2018-03-10 MED ORDER — TRIAMCINOLONE ACETONIDE 40 MG/ML IJ SUSP (RADIOLOGY): 60.0000 mg | Freq: Once | INTRAMUSCULAR | Status: DC

## 2018-03-10 MED ORDER — IOHEXOL 300 MG/ML  SOLN
1.0000 mL | Freq: Once | INTRAMUSCULAR | Status: AC | PRN
  Administered 2018-03-10: 1 mL via INTRA_ARTICULAR

## 2018-03-10 MED ORDER — DEXAMETHASONE SODIUM PHOSPHATE 4 MG/ML IJ SOLN
3.0000 mg | Freq: Once | INTRAMUSCULAR | Status: AC
  Administered 2018-03-10: 3 mg via INTRA_ARTICULAR

## 2018-03-10 NOTE — Discharge Instructions (Signed)
Spinal Injection Discharge Instruction Sheet ° °1. You may resume a regular diet and any medications that you routinely take, including pain medications. ° °2. No driving the rest of the day of the procedure. ° °3. Light activity throughout the rest of the day.  Do not do any strenuous work, exercise, bending or lifting.  The day following the procedure, you may resume normal physical activity but you should refrain from exercising or physical therapy for at least three days. ° ° °Common Side Effects: ° °· Headaches- take your usual medications as directed by your physician.   ° °· Restlessness or inability to sleep- you may have trouble sleeping for the next few days.  Ask your referring physician if you need any medication for sleep if over the counter sleep medications do not help. ° °· Facial flushing or redness- this should subside within a few days. ° °· Increased pain- a temporary increase in pain a day or two following your procedure is not unusual.  Take your pain medication as prescribed by your referring physician.  You may use ice to the injection site as needed.  Please do not use heat for 24 hours. ° °· Leg cramps ° °Please contact our office at [phone] for the following symptoms: °· Fever greater than 100 degrees. °· Headaches unresolved with medication after 2-3 days. °· Increased swelling, pain, or redness at injection site. ° °Thank you for visiting our office. ° ° °You may resume Plavix today. °

## 2018-03-11 ENCOUNTER — Other Ambulatory Visit: Payer: Self-pay | Admitting: Internal Medicine

## 2018-03-13 ENCOUNTER — Ambulatory Visit (INDEPENDENT_AMBULATORY_CARE_PROVIDER_SITE_OTHER): Payer: Medicare Other | Admitting: Podiatry

## 2018-03-13 DIAGNOSIS — E1151 Type 2 diabetes mellitus with diabetic peripheral angiopathy without gangrene: Secondary | ICD-10-CM

## 2018-03-13 DIAGNOSIS — M79675 Pain in left toe(s): Secondary | ICD-10-CM | POA: Diagnosis not present

## 2018-03-13 DIAGNOSIS — L84 Corns and callosities: Secondary | ICD-10-CM

## 2018-03-13 DIAGNOSIS — M79674 Pain in right toe(s): Secondary | ICD-10-CM

## 2018-03-13 DIAGNOSIS — B351 Tinea unguium: Secondary | ICD-10-CM

## 2018-03-13 NOTE — Patient Instructions (Signed)
Diabetes Mellitus and Foot Care Foot care is an important part of your health, especially when you have diabetes. Diabetes may cause you to have problems because of poor blood flow (circulation) to your feet and legs, which can cause your skin to:  Become thinner and drier.  Break more easily.  Heal more slowly.  Peel and crack. You may also have nerve damage (neuropathy) in your legs and feet, causing decreased feeling in them. This means that you may not notice minor injuries to your feet that could lead to more serious problems. Noticing and addressing any potential problems early is the best way to prevent future foot problems. How to care for your feet Foot hygiene  Wash your feet daily with warm water and mild soap. Do not use hot water. Then, pat your feet and the areas between your toes until they are completely dry. Do not soak your feet as this can dry your skin.  Trim your toenails straight across. Do not dig under them or around the cuticle. File the edges of your nails with an emery board or nail file.  Apply a moisturizing lotion or petroleum jelly to the skin on your feet and to dry, brittle toenails. Use lotion that does not contain alcohol and is unscented. Do not apply lotion between your toes. Shoes and socks  Wear clean socks or stockings every day. Make sure they are not too tight. Do not wear knee-high stockings since they may decrease blood flow to your legs.  Wear shoes that fit properly and have enough cushioning. Always look in your shoes before you put them on to be sure there are no objects inside.  To break in new shoes, wear them for just a few hours a day. This prevents injuries on your feet. Wounds, scrapes, corns, and calluses  Check your feet daily for blisters, cuts, bruises, sores, and redness. If you cannot see the bottom of your feet, use a mirror or ask someone for help.  Do not cut corns or calluses or try to remove them with medicine.  If you  find a minor scrape, cut, or break in the skin on your feet, keep it and the skin around it clean and dry. You may clean these areas with mild soap and water. Do not clean the area with peroxide, alcohol, or iodine.  If you have a wound, scrape, corn, or callus on your foot, look at it several times a day to make sure it is healing and not infected. Check for: ? Redness, swelling, or pain. ? Fluid or blood. ? Warmth. ? Pus or a bad smell. General instructions  Do not cross your legs. This may decrease blood flow to your feet.  Do not use heating pads or hot water bottles on your feet. They may burn your skin. If you have lost feeling in your feet or legs, you may not know this is happening until it is too late.  Protect your feet from hot and cold by wearing shoes, such as at the beach or on hot pavement.  Schedule a complete foot exam at least once a year (annually) or more often if you have foot problems. If you have foot problems, report any cuts, sores, or bruises to your health care provider immediately. Contact a health care provider if:  You have a medical condition that increases your risk of infection and you have any cuts, sores, or bruises on your feet.  You have an injury that is not   healing.  You have redness on your legs or feet.  You feel burning or tingling in your legs or feet.  You have pain or cramps in your legs and feet.  Your legs or feet are numb.  Your feet always feel cold.  You have pain around a toenail. Get help right away if:  You have a wound, scrape, corn, or callus on your foot and: ? You have pain, swelling, or redness that gets worse. ? You have fluid or blood coming from the wound, scrape, corn, or callus. ? Your wound, scrape, corn, or callus feels warm to the touch. ? You have pus or a bad smell coming from the wound, scrape, corn, or callus. ? You have a fever. ? You have a red line going up your leg. Summary  Check your feet every day  for cuts, sores, red spots, swelling, and blisters.  Moisturize feet and legs daily.  Wear shoes that fit properly and have enough cushioning.  If you have foot problems, report any cuts, sores, or bruises to your health care provider immediately.  Schedule a complete foot exam at least once a year (annually) or more often if you have foot problems. This information is not intended to replace advice given to you by your health care provider. Make sure you discuss any questions you have with your health care provider. Document Released: 12/22/1999 Document Revised: 02/05/2017 Document Reviewed: 01/26/2016 Elsevier Interactive Patient Education  2019 [employer]  Onychomycosis/Fungal Toenails  WHAT IS IT? An infection that lies within the keratin of your nail plate that is caused by a fungus.  WHY ME? Fungal infections affect all ages, sexes, races, and creeds.  There may be many factors that predispose you to a fungal infection such as age, coexisting medical conditions such as diabetes, or an autoimmune disease; stress, medications, fatigue, genetics, etc.  Bottom line: fungus thrives in a warm, moist environment and your shoes offer such a location.  IS IT CONTAGIOUS? Theoretically, yes.  You do not want to share shoes, nail clippers or files with someone who has fungal toenails.  Walking around barefoot in the same room or sleeping in the same bed is unlikely to transfer the organism.  It is important to realize, however, that fungus can spread easily from one nail to the next on the same foot.  HOW DO WE TREAT THIS?  There are several ways to treat this condition.  Treatment may depend on many factors such as age, medications, pregnancy, liver and kidney conditions, etc.  It is best to ask your doctor which options are available to you.  1. No treatment.   Unlike many other medical concerns, you can live with this condition.  However for many people this can be a painful condition and  may lead to ingrown toenails or a bacterial infection.  It is recommended that you keep the nails cut short to help reduce the amount of fungal nail. 2. Topical treatment.  These range from herbal remedies to prescription strength nail lacquers.  About 40-50% effective, topicals require twice daily application for approximately 9 to 12 months or until an entirely new nail has grown out.  The most effective topicals are medical grade medications available through physicians offices. 3. Oral antifungal medications.  With an 80-90% cure rate, the most common oral medication requires 3 to 4 months of therapy and stays in your system for a year as the new nail grows out.  Oral antifungal medications do require   blood work to make sure it is a safe drug for you.  A liver function panel will be performed prior to starting the medication and after the first month of treatment.  It is important to have the blood work performed to avoid any harmful side effects.  In general, this medication safe but blood work is required. 4. Laser Therapy.  This treatment is performed by applying a specialized laser to the affected nail plate.  This therapy is noninvasive, fast, and non-painful.  It is not covered by insurance and is therefore, out of pocket.  The results have been very good with a 80-95% cure rate.  The [facility] is the only practice in the area to offer this therapy. 5. Permanent Nail Avulsion.  Removing the entire nail so that a new nail will not grow back. 

## 2018-03-14 ENCOUNTER — Other Ambulatory Visit: Payer: Self-pay | Admitting: Internal Medicine

## 2018-03-17 NOTE — Telephone Encounter (Signed)
Patient would like refill for Symbicort.  Pharmacy is [facility] in [place]. Patient phone number is [phone].

## 2018-03-17 NOTE — Progress Notes (Signed)
Cardiology Office Note  Date: 03/18/2018   ID: [name], DOB 06/18/1950, MRN [id number]  PCP: [name], MD  Primary Cardiologist: Rozann Lesches, MD   Chief Complaint  Patient presents with  . Coronary Artery Disease    History of Present Illness: [name] is a 68 y.o. male last seen in March 2019.  He is here for a routine follow-up visit.  He does not report any angina symptoms, has a history of previously documented nonobstructive CAD.  He goes to the gym most mornings, walks on the treadmill and uses a stationary bicycle, then does some light weights.  He has claudication, but indicates that it has been stable.  He reports no significant ulcerations or poorly healing wounds.  I reviewed his medications which are outlined below.  He is on Plavix, Lipitor, and Cozaar from a vascular perspective.  I personally reviewed his ECG today which shows normal sinus rhythm.  Continues regular follow-up with [facility], last seen in February. I reviewed the note.  His last LDL was 104.  He reports compliance with Lipitor.  Past Medical History:  Diagnosis Date  . Arthritis    RA  . Bronchitis   . Carotid artery occlusion   . Cervical spine disease    Neck surgery in 2007 with Dr. Joya Salm  . COPD (chronic obstructive pulmonary disease) ([place])   . Coronary atherosclerosis of native coronary artery    Nonobstrtuctive by catheterization 2007 and 2013  . DVT (deep venous thrombosis) ([place])   . Essential hypertension, benign   . GERD (gastroesophageal reflux disease)   . Hyperlipidemia   . PAD (peripheral artery disease) ([place])    s/p left fem-pop bypass 2007; PTA and stenting of right iliac artery 09/2011  . Rheumatoid arthritis(714.0)   . Type 2 diabetes mellitus ([place])     Past Surgical History:  Procedure Laterality Date  . ABDOMINAL AORTAGRAM N/A 10/03/2011   Procedure: ABDOMINAL Maxcine Ham;  Surgeon: Conrad Yalobusha, MD;  Location: [facility] CATH LAB;  Service: Cardiovascular;   Laterality: N/A;  . ABDOMINAL AORTOGRAM W/LOWER EXTREMITY N/A 01/16/2017   Procedure: ABDOMINAL AORTOGRAM W/LOWER EXTREMITY;  Surgeon: Conrad Northview, MD;  Location: [place] [facility] CV LAB;  Service: Cardiovascular;  Laterality: N/A;  . CERVICAL DISC SURGERY    . COLONOSCOPY WITH PROPOFOL N/A 02/17/2017   Procedure: COLONOSCOPY WITH PROPOFOL;  Surgeon: Daneil Dolin, MD;  Location: [facility] END[facility];  Service: Endoscopy;  Laterality: N/A;  2:45pm  . FEMORAL-POPLITEAL BYPASS GRAFT  11/06/2005  . ILIAC ARTERY STENT  10-03-11   Right CIA stenting  . PERIPHERAL VASCULAR BALLOON ANGIOPLASTY Right 01/16/2017   Procedure: PERIPHERAL VASCULAR BALLOON ANGIOPLASTY;  Surgeon: Conrad Naples, MD;  Location: [place] CV LAB;  Service: Cardiovascular;  Laterality: Right;  common iliac   . PR VEIN BYPASS GRAFT,AORTO-FEM-POP  06-22-2010   Redo Left Fem-Pop  . Repair of left arm fracture    . 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 SURGERY SCROTAL / TESTICULAR      Current Outpatient Medications  Medication Sig Dispense Refill  . atorvastatin (LIPITOR) 20 MG tablet Take 20 mg by mouth daily.    . budesonide-formoterol (SYMBICORT) 160-4.5 MCG/ACT inhaler Inhale 2 puffs into the lungs 2 (two) times daily. 1 Inhaler 11  . celecoxib (CELEBREX) 200 MG capsule Take 1 capsule (200 mg total) by mouth daily. (HOLD X 5 DAYS WHILE TAKING THE IBUPROFEN) (Patient taking differently: Take 200 mg by mouth daily. )    .  clopidogrel (PLAVIX) 75 MG tablet Take 1 tablet (75 mg total) by mouth daily. 30 tablet 11  . Dexlansoprazole (DEXILANT) 30 MG capsule Take 30- 60 min before your first and last meals of the day (Patient taking differently: Take 30 mg by mouth daily. ) 60 capsule 11  . docusate sodium (COLACE) 100 MG capsule Take 100 mg by mouth as needed.     Marland Kitchen glipiZIDE (GLUCOTROL) 5 MG tablet Take by mouth.    Marland Kitchen HYDROcodone-acetaminophen (NORCO) 7.5-325 MG per tablet Take 1 tablet by mouth 2 (two) times daily as needed for moderate  pain.     Marland Kitchen JANUVIA 100 MG tablet Take 100 mg by mouth daily.     Marland Kitchen ketoconazole (NIZORAL) 2 % cream Apply to both feet once daily for 4 weeks 30 g 0  . losartan (COZAAR) 50 MG tablet Take 2 each am (Patient taking differently: Take 50 mg by mouth daily. Take 2 each am) 60 tablet 11  . metFORMIN (GLUCOPHAGE) 500 MG tablet Take 500 mg by mouth 2 (two) times daily with a meal.     . nitroGLYCERIN (NITROSTAT) 0.4 MG SL tablet Place 1 tablet (0.4 mg total) under the tongue every 5 (five) minutes as needed for chest pain. 90 tablet 3  . ONE TOUCH ULTRA TEST test strip     . PROAIR HFA 108 (90 BASE) MCG/ACT inhaler Inhale 2 puffs into the lungs every 6 (six) hours as needed for wheezing or shortness of breath.     Loura Pardon Salicylate (ASPERCREME EX) Apply topically as needed.    . vitamin C (ASCORBIC ACID) 500 MG tablet Take 500 mg by mouth daily.    . VOLTAREN 1 % GEL Apply 1 application topically daily as needed (pain).     Morrie Sheldon 5 MG TABS Take 1 tablet by mouth daily. 90 tablet 0   No current facility-administered medications for this visit.    Allergies:  Patient has no known allergies.   Social History: The patient  reports that he quit smoking about 6 years ago. His smoking use included cigarettes and cigars. He has a 45.00 pack-year smoking history. He has never used smokeless tobacco. He reports that he does not drink alcohol or use drugs.   ROS:  Please see the history of present illness. Otherwise, complete review of systems is positive for hearing loss.  All other systems are reviewed and negative.   Physical Exam: VS:  BP 130/80   Pulse 91   Ht 5\' 10"  (1.778 m)   Wt 179 lb (81.2 kg)   BMI 25.68 kg/m , BMI Body mass index is 25.68 kg/m.  Wt Readings from Last 3 Encounters:  03/18/18 179 lb (81.2 kg)  02/26/18 177 lb 0.5 oz (80.3 kg)  12/09/17 180 lb (81.6 kg)    General: Patient appears comfortable at rest. HEENT: Conjunctiva and lids normal, oropharynx clear. Neck:  Supple, no elevated JVP or carotid bruits, no thyromegaly. Lungs: Clear to auscultation, nonlabored breathing at rest. Cardiac: Regular rate and rhythm, S4, no significant systolic murmur, no pericardial rub. Abdomen: Soft, nontender, bowel sounds present. Extremities: No pitting edema or ulcerations, distal pulses diminished. Skin: Warm and dry. Musculoskeletal: No kyphosis. Neuropsychiatric: Alert and oriented x3, affect grossly appropriate.  ECG: I personally reviewed the tracing from 01/16/2017 which showed sinus rhythm.  Recent Labwork: 06/06/2017: TSH 1.46 12/09/2017: ALT 16; AST 16; BUN 15; Creat 0.87; Hemoglobin 12.4; Platelets 267; Potassium 4.1; Sodium 138     Component  Value Date/Time   CHOL 193 06/06/2017 0857   TRIG 146 06/06/2017 0857   HDL 64 06/06/2017 0857   CHOLHDL 3.0 06/06/2017 0857   LDLCALC 104 (H) 06/06/2017 0857    Other Studies Reviewed Today:  Echocardiogram 11/24/2014: Study Conclusions  - Left ventricle: The cavity size was normal. Wall thickness was  normal. Systolic function was normal. The estimated ejection  fraction was in the range of 60% to 65%. Doppler parameters are  consistent with abnormal left ventricular relaxation (grade 1  diastolic dysfunction). - Aortic valve: Mildly calcified annulus. Trileaflet; mildly  thickened leaflets. There was mild regurgitation. The AI VC is  0.3 cm. Valve area (VTI): 2.18 cm^2. Valve area (Vmax): 1.73  cm^2. Valve area (Vmean): 2.35 cm^2. Regurgitation pressure  half-time: 546 ms. - Mitral valve: Mildly calcified annulus. Mildly thickened leaflets. - Atrial septum: No defect or patent foramen ovale was identified. - Technically difficult study.  Assessment and Plan:  1.  Nonobstructive CAD without active angina symptoms.  ECG reviewed and stable.  He participates in regular exercise.  Continue Plavix and Lipitor.  2.  Severe PAD status post revascularization as outlined above.  He has stable  claudication, no rest symptoms or ulcerations.  He continues to follow with [facility].  3.  Mixed hyperlipidemia on Lipitor.  Last LDL was 104 and less he has had follow-up labs per Dr. [name].  Ideally, his LDL should be 70 or less.  May need an increase in Lipitor dosing.  4.  COPD, followed by Dr. Melvyn Novas.  Current medicines were reviewed with the patient today.   Orders Placed This Encounter  Procedures  . EKG 12-Lead    Disposition: Follow-up in 1 year, sooner if symptoms intervene.  Signed, Satira Sark, MD, [facility] 03/18/2018 8:44 AM    [facility] [place], [place], [place] [address] Phone: [phone]; Fax: [phone]

## 2018-03-18 ENCOUNTER — Encounter: Payer: Self-pay | Admitting: Cardiology

## 2018-03-18 ENCOUNTER — Other Ambulatory Visit: Payer: Self-pay

## 2018-03-18 ENCOUNTER — Ambulatory Visit (INDEPENDENT_AMBULATORY_CARE_PROVIDER_SITE_OTHER): Payer: Medicare Other | Admitting: Cardiology

## 2018-03-18 VITALS — BP 130/80 | HR 91 | Ht 70.0 in | Wt 179.0 lb

## 2018-03-18 DIAGNOSIS — E782 Mixed hyperlipidemia: Secondary | ICD-10-CM

## 2018-03-18 DIAGNOSIS — J449 Chronic obstructive pulmonary disease, unspecified: Secondary | ICD-10-CM

## 2018-03-18 DIAGNOSIS — I739 Peripheral vascular disease, unspecified: Secondary | ICD-10-CM

## 2018-03-18 DIAGNOSIS — I2510 Atherosclerotic heart disease of native coronary artery without angina pectoris: Secondary | ICD-10-CM | POA: Diagnosis not present

## 2018-03-18 NOTE — Patient Instructions (Addendum)

## 2018-03-23 ENCOUNTER — Encounter: Payer: Self-pay | Admitting: Podiatry

## 2018-03-23 MED ORDER — BUDESONIDE-FORMOTEROL FUMARATE 160-4.5 MCG/ACT IN AERO: INHALATION_SPRAY | RESPIRATORY_TRACT | 3 refills | Status: DC

## 2018-03-23 NOTE — Addendum Note (Signed)
Addended by: Karmen Stabs on: 03/23/2018 03:59 PM   Modules accepted: Orders

## 2018-03-23 NOTE — Progress Notes (Signed)
Subjective: [name] is a 68 y.o. y.o. male who presents for preventative diabetic  foot care today with h/o PAD and cc of painful, discolored, thick toenails and painful callus/corn which interfere with daily activities. Pain is aggravated when wearing enclosed shoe gear. Pain is relieved with periodic professional debridement.  [name], MD is his PCP.   Current Outpatient Medications:  .  atorvastatin (LIPITOR) 20 MG tablet, Take 20 mg by mouth daily., Disp: , Rfl:  .  budesonide-formoterol (SYMBICORT) 160-4.5 MCG/ACT inhaler, INHALE 2 PUFFS BY MOUTH TWICE A DAY RINSE MOUTH AFTER USE., Disp: 10.2 g, Rfl: 3 .  celecoxib (CELEBREX) 200 MG capsule, Take 1 capsule (200 mg total) by mouth daily. (HOLD X 5 DAYS WHILE TAKING THE IBUPROFEN) (Patient taking differently: Take 200 mg by mouth daily. ), Disp: , Rfl:  .  clopidogrel (PLAVIX) 75 MG tablet, Take 1 tablet (75 mg total) by mouth daily., Disp: 30 tablet, Rfl: 11 .  Dexlansoprazole (DEXILANT) 30 MG capsule, Take 30- 60 min before your first and last meals of the day (Patient taking differently: Take 30 mg by mouth daily. ), Disp: 60 capsule, Rfl: 11 .  docusate sodium (COLACE) 100 MG capsule, Take 100 mg by mouth as needed. , Disp: , Rfl:  .  glipiZIDE (GLUCOTROL) 5 MG tablet, Take by mouth., Disp: , Rfl:  .  HYDROcodone-acetaminophen (NORCO) 7.5-325 MG per tablet, Take 1 tablet by mouth 2 (two) times daily as needed for moderate pain. , Disp: , Rfl:  .  JANUVIA 100 MG tablet, Take 100 mg by mouth daily. , Disp: , Rfl:  .  ketoconazole (NIZORAL) 2 % cream, Apply to both feet once daily for 4 weeks, Disp: 30 g, Rfl: 0 .  losartan (COZAAR) 50 MG tablet, Take 2 each am (Patient taking differently: Take 50 mg by mouth daily. Take 2 each am), Disp: 60 tablet, Rfl: 11 .  metFORMIN (GLUCOPHAGE) 500 MG tablet, Take 500 mg by mouth 2 (two) times daily with a meal. , Disp: , Rfl:  .  nitroGLYCERIN (NITROSTAT) 0.4 MG SL tablet, Place 1 tablet (0.4  mg total) under the tongue every 5 (five) minutes as needed for chest pain., Disp: 90 tablet, Rfl: 3 .  ONE TOUCH ULTRA TEST test strip, , Disp: , Rfl:  .  PROAIR HFA 108 (90 BASE) MCG/ACT inhaler, Inhale 2 puffs into the lungs every 6 (six) hours as needed for wheezing or shortness of breath. , Disp: , Rfl:  .  Trolamine Salicylate (ASPERCREME EX), Apply topically as needed., Disp: , Rfl:  .  vitamin C (ASCORBIC ACID) 500 MG tablet, Take 500 mg by mouth daily., Disp: , Rfl:  .  VOLTAREN 1 % GEL, Apply 1 application topically daily as needed (pain). , Disp: , Rfl:  .  XELJANZ 5 MG TABS, Take 1 tablet by mouth daily., Disp: 90 tablet, Rfl: 0  No Known Allergies  Objective: Vascular Examination: Capillary refill time <3 seconds x 10 digits.  Dorsalis pedis pulses absent left foot; diminished right foot.  Posterior tibial pulses absent b/l.  No digital hair x 10 digits.  Skin temperature WNL b/l.  Dermatological Examination: Skin with normal turgor, texture and tone b/l.  Toenails 1-5 b/l discolored, thick, dystrophic with subungual debris and pain with palpation to nailbeds due to thickness of nails.  Hyperkeratotic lesion noted plantarmedial hallux left foot. No erythema, no edema, no drainage, no flocculence noted.  Musculoskeletal: Muscle strength 5/5 to all LE muscle  groups  Neurological: Sensation intact with 10 gram monofilament.  Vibratory sensation intact b/l.   Assessment: 1. Painful onychomycosis toenails 1-5 b/l 2. Callus right hallux 3. NIDDM with Peripheral arterial disease  Plan: 1. Discuss diabetic foot care principles. Literature dispensed. 2. Toenails 1-5 b/l were debrided in length and girth without iatrogenic bleeding. 3. Hyperkeratotic lesion(s) pared right hallux with sterile chisel blade and gently smoothed with burr without incident. 4. Patient to continue soft, supportive shoe gear. 5. Patient to report any pedal injuries to medical professional   6. Follow up 3 months.  7. Patient/POA to call should there be a concern in the interim.

## 2018-04-08 ENCOUNTER — Telehealth: Payer: Self-pay | Admitting: Internal Medicine

## 2018-04-08 DIAGNOSIS — J449 Chronic obstructive pulmonary disease, unspecified: Secondary | ICD-10-CM

## 2018-04-08 MED ORDER — PREDNISONE 10 MG PO TABS: ORAL_TABLET | ORAL | 0 refills | Status: DC

## 2018-04-08 MED ORDER — AZITHROMYCIN 250 MG PO TABS: ORAL_TABLET | ORAL | 0 refills | Status: DC

## 2018-04-08 NOTE — Telephone Encounter (Signed)
Pt is requesting call back on cell number. CB is [phone]

## 2018-04-08 NOTE — Telephone Encounter (Signed)
Called and spoke with pt who stated he has had a cough with gray mucus x2-3 days. Pt stated he had a cold with clear postnasal drainage.  Pt denies any fever. Pt has SOB which he said is due to the COPD and also states he has some tightness in chest and pain on left side.  Pt states that he has not tried any meds to see if it would help with symptoms. Pt is requesting something to be prescribed. Dr. Melvyn Novas, please advise. Thanks!

## 2018-04-08 NOTE — Telephone Encounter (Signed)
Prednisone 10 mg take  4 each am x 2 days,   2 each am x 2 days,  1 each am x 2 days and stop   zpak   Missed last appt so needs to be scheduled p July 08 2018 with pfts if wants to continue to be treated thru this office over the phone

## 2018-04-08 NOTE — Telephone Encounter (Signed)
Called and spoke with pt stating to him that MW said we could send in pred taper and zpak to see if that would help and pt expressed understanding. Verified pt's pharmacy and sent meds in. Also stated to pt that we needed to schedule him a ROV with MW in July since he missed his last appt and pt expressed understanding. Pt has been scheduled for ROV with MW 7/9 with Full PFT prior. Nothing further needed.

## 2018-05-05 NOTE — Progress Notes (Signed)
Virtual Visit via Telephone Note  I connected with [name] on 05/05/18 at  8:45 AM EDT by telephone and verified that I am speaking with the correct person using two identifiers.   I discussed the limitations, risks, security and privacy concerns of performing an evaluation and management service by telephone and the availability of in person appointments. I also discussed with the patient that there may be a patient responsible charge related to this service. The patient expressed understanding and agreed to proceed. This service was conducted via virtual visit. Patient was unable to use Webex, so we reached him by telephone. The patient was located at home. I was located in my office.  Consent was obtained prior to the virtual visit and is aware of possible charges through their insurance for this visit.  The patient is an established patient.  Dr. Estanislado Pandy, MD conducted the virtual visit and Hazel Sams, PA-C acted as scribe during the service.  Office staff helped with scheduling follow up visits after the service was conducted.    CC: Left hand pain   History of Present Illness: Patient is a 68 year old male with a past medical history of seronegative rheumatoid arthritis and osteoarthritis.  He is taking Xeljanz 5 mg 1 tablet by mouth daily.  He has not missed any doses.   He continues to have pain and swelling in the left hand.  He has chronic right shoulder pain. He has had cortisone injections in the past, which provided temporary relief for only about 1 week.  He denies any other joint pain or joint swelling.   Review of Systems  Constitutional: Positive for malaise/fatigue. Negative for fever.  Eyes: Negative for photophobia, pain, discharge and redness.       +Dry eyes  Respiratory: Negative for cough, shortness of breath and wheezing.        HX of COPD  Cardiovascular: Negative for chest pain and palpitations.  Gastrointestinal: Negative for blood in stool, constipation and  diarrhea.  Genitourinary: Negative for dysuria.  Musculoskeletal: Positive for joint pain. Negative for back pain, myalgias and neck pain.       +Joint swelling   Skin: Negative for rash.  Neurological: Negative for dizziness and headaches.  Psychiatric/Behavioral: Negative for depression. The patient is not nervous/anxious and does not have insomnia.       Observations/Objective: Physical Exam  Constitutional: He is oriented to person, place, and time.  Neurological: He is alert and oriented to person, place, and time.  Psychiatric: Mood, memory, affect and judgment normal.   Patient reports morning stiffness for 5-10 minutes.   Patient reports nocturnal pain.  Difficulty dressing/grooming: Denies Difficulty climbing stairs: Reports Difficulty getting out of chair: Reports Difficulty using hands for taps, buttons, cutlery, and/or writing: Denies   Assessment and Plan: 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 factor [facility]): He is experiencing left hand pain and right shoulder joint pain.  He reports joint swelling in the left hand.  He has had right shoulder cortisone injections in the past, which provided temporary relief.  He has been taking Xeljanz 5 mg 1 tablet by mouth daily.  He has not missed any doses recently.  He will continue on this current treatment regimen.  He is overdue for lab work.  He will require CBC, CMP, lipid panel, and TB gold. He does not need any refills at this time.   He will continue on this current treatment regimen.  He does not want to make  any medication changes at this time.  He was advised to notify us if he develops increased joint pain or joint swelling.  He will follow up in 1-2 months for examination.    High risk medication use - He is taking Xeljanz 5 mg 1 tablet by mouth daily.  Methotrexate discontinued due to alcohol use, Orencia declined by patient, treated with Morrie Sheldon at [facility].  CBC and CMP were drawn on  12/09/17.  He is overdue to update lab work.  Standing orders were placed today.  TB gold negative on 06/06/17.  Future order for TB gold was placed today.  He was advised to hold Morrie Sheldon anytime he has an infection and to resume once the infection has cleared.  We discussed the importance of social distancing and following the standard precautions recommended by the CDC.  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  Right elbow has mild joint contracture.   He has no discomfort or swelling at this time.   Primary osteoarthritis of both hands: He has chronic left hand pain and stiffness.    Primary osteoarthritis of both feet: He has no discomfort at this time.   DDD (degenerative disc disease), cervical: He has no discomfort at this time.   Elevated CK -CK was 254 on 12/10/18.  He has no muscle tenderness or muscle weakness at this time.  Rotator cuff syndrome of right shoulder: Chronic pain.  He has had several cortisone injections in the past, which only provided temporary relief.   Muscle cramps: He has no muscle cramps or muscle spasms at this time.   History of bilateral carpal tunnel release: He is asymptomatic at this time.   Follow Up Instructions: He will follow up in 1-2 months.    I discussed the assessment and treatment plan with the patient. The patient was provided an opportunity to ask questions and all were answered. The patient agreed with the plan and demonstrated an understanding of the instructions.   The patient was advised to call back or seek an in-person evaluation if the symptoms worsen or if the condition fails to improve as anticipated.  I provided 25 minutes of non-face-to-face time during this encounter.  Bo Merino, MD   Scribed by-  Ofilia Neas, PA-C

## 2018-05-06 ENCOUNTER — Other Ambulatory Visit: Payer: Self-pay

## 2018-05-06 DIAGNOSIS — I779 Disorder of arteries and arterioles, unspecified: Secondary | ICD-10-CM

## 2018-05-13 ENCOUNTER — Encounter: Payer: Self-pay | Admitting: Rheumatology

## 2018-05-13 ENCOUNTER — Telehealth (INDEPENDENT_AMBULATORY_CARE_PROVIDER_SITE_OTHER): Payer: Medicare Other | Admitting: Rheumatology

## 2018-05-13 DIAGNOSIS — I10 Essential (primary) hypertension: Secondary | ICD-10-CM

## 2018-05-13 DIAGNOSIS — M67432 Ganglion, left wrist: Secondary | ICD-10-CM

## 2018-05-13 DIAGNOSIS — Z9889 Other specified postprocedural states: Secondary | ICD-10-CM

## 2018-05-13 DIAGNOSIS — R252 Cramp and spasm: Secondary | ICD-10-CM

## 2018-05-13 DIAGNOSIS — M19071 Primary osteoarthritis, right ankle and foot: Secondary | ICD-10-CM

## 2018-05-13 DIAGNOSIS — E782 Mixed hyperlipidemia: Secondary | ICD-10-CM

## 2018-05-13 DIAGNOSIS — M24522 Contracture, left elbow: Secondary | ICD-10-CM

## 2018-05-13 DIAGNOSIS — M19042 Primary osteoarthritis, left hand: Secondary | ICD-10-CM

## 2018-05-13 DIAGNOSIS — I739 Peripheral vascular disease, unspecified: Secondary | ICD-10-CM

## 2018-05-13 DIAGNOSIS — J449 Chronic obstructive pulmonary disease, unspecified: Secondary | ICD-10-CM

## 2018-05-13 DIAGNOSIS — M5030 Other cervical disc degeneration, unspecified cervical region: Secondary | ICD-10-CM

## 2018-05-13 DIAGNOSIS — M19072 Primary osteoarthritis, left ankle and foot: Secondary | ICD-10-CM

## 2018-05-13 DIAGNOSIS — M19041 Primary osteoarthritis, right hand: Secondary | ICD-10-CM

## 2018-05-13 DIAGNOSIS — M0609 Rheumatoid arthritis without rheumatoid factor, multiple sites: Secondary | ICD-10-CM

## 2018-05-13 DIAGNOSIS — E1122 Type 2 diabetes mellitus with diabetic chronic kidney disease: Secondary | ICD-10-CM

## 2018-05-13 DIAGNOSIS — M75101 Unspecified rotator cuff tear or rupture of right shoulder, not specified as traumatic: Secondary | ICD-10-CM

## 2018-05-13 DIAGNOSIS — N183 Type 2 diabetes mellitus with diabetic chronic kidney disease: Secondary | ICD-10-CM

## 2018-05-13 DIAGNOSIS — Z79899 Other long term (current) drug therapy: Secondary | ICD-10-CM

## 2018-05-20 ENCOUNTER — Ambulatory Visit (INDEPENDENT_AMBULATORY_CARE_PROVIDER_SITE_OTHER): Payer: Medicare Other

## 2018-05-20 ENCOUNTER — Encounter: Payer: Self-pay | Admitting: Internal Medicine

## 2018-05-20 ENCOUNTER — Other Ambulatory Visit: Payer: Self-pay

## 2018-05-20 ENCOUNTER — Ambulatory Visit (INDEPENDENT_AMBULATORY_CARE_PROVIDER_SITE_OTHER): Payer: Medicare Other | Admitting: Internal Medicine

## 2018-05-20 VITALS — BP 146/84 | HR 72 | Temp 98.7°F | Ht 70.0 in | Wt 176.0 lb

## 2018-05-20 DIAGNOSIS — R0609 Other forms of dyspnea: Secondary | ICD-10-CM | POA: Diagnosis not present

## 2018-05-20 DIAGNOSIS — R0789 Other chest pain: Secondary | ICD-10-CM

## 2018-05-20 DIAGNOSIS — R0600 Dyspnea, unspecified: Secondary | ICD-10-CM

## 2018-05-20 DIAGNOSIS — J449 Chronic obstructive pulmonary disease, unspecified: Secondary | ICD-10-CM | POA: Diagnosis not present

## 2018-05-20 LAB — BASIC METABOLIC PANEL
BUN: 14 mg/dL (ref 6–23)
CO2: 25 mEq/L (ref 19–32)
Calcium: 8.7 mg/dL (ref 8.4–10.5)
Chloride: 107 mEq/L (ref 96–112)
Creatinine, Ser: 0.85 mg/dL (ref 0.40–1.50)
GFR: 108.65 mL/min (ref >60.00)
Glucose, Bld: 208 mg/dL — ABNORMAL HIGH (ref 70–99)
Potassium: 3.9 mEq/L (ref 3.5–5.1)
Sodium: 141 mEq/L (ref 135–145)

## 2018-05-20 LAB — CBC WITH DIFFERENTIAL/PLATELET
Basophils Absolute: 0 10*3/uL (ref 0.0–0.1)
Basophils Relative: 0.7 % (ref 0.0–3.0)
Eosinophils Absolute: 0.1 10*3/uL (ref 0.0–0.7)
Eosinophils Relative: 1.1 % (ref 0.0–5.0)
HCT: 38.1 % — ABNORMAL LOW (ref 39.0–52.0)
Hemoglobin: 12.8 g/dL — ABNORMAL LOW (ref 13.0–17.0)
Lymphocytes Relative: 32.6 % (ref 12.0–46.0)
Lymphs Abs: 1.6 10*3/uL (ref 0.7–4.0)
MCHC: 33.6 g/dL (ref 30.0–36.0)
MCV: 87.9 fl (ref 78.0–100.0)
Monocytes Absolute: 0.3 10*3/uL (ref 0.1–1.0)
Monocytes Relative: 6 % (ref 3.0–12.0)
Neutro Abs: 2.8 10*3/uL (ref 1.4–7.7)
Neutrophils Relative %: 59.6 % (ref 43.0–77.0)
Platelets: 268 10*3/uL (ref 150.0–400.0)
RBC: 4.34 Mil/uL (ref 4.22–5.81)
RDW: 16.1 % — ABNORMAL HIGH (ref 11.5–15.5)
WBC: 4.8 10*3/uL (ref 4.0–10.5)

## 2018-05-20 LAB — TSH: TSH: 1.35 u[IU]/mL (ref 0.35–4.50)

## 2018-05-20 LAB — SEDIMENTATION RATE: Sed Rate: 13 mm/hr (ref 0–20)

## 2018-05-20 LAB — BRAIN NATRIURETIC PEPTIDE: Pro B Natriuretic peptide (BNP): 61 pg/mL (ref 0.0–100.0)

## 2018-05-20 MED ORDER — TIOTROPIUM BROMIDE MONOHYDRATE 2.5 MCG/ACT IN AERS: 2.0000 | INHALATION_SPRAY | Freq: Every day | RESPIRATORY_TRACT | 0 refills | Status: DC

## 2018-05-20 MED ORDER — TIOTROPIUM BROMIDE MONOHYDRATE 2.5 MCG/ACT IN AERS: 2.0000 | INHALATION_SPRAY | Freq: Every day | RESPIRATORY_TRACT | 11 refills | Status: DC

## 2018-05-20 NOTE — Progress Notes (Signed)
Spoke with pt and notified of results per Dr. Wert. Pt verbalized understanding and denied any questions. 

## 2018-05-20 NOTE — Progress Notes (Signed)
Subjective:    Patient ID: [name], male   DOB: 16-Aug-1950    MRN: [id number]    Brief patient profile:  23 yobm quit smoking 2013 with dx of copd GOLD II with reversibility on prn inhalers didn't really use it much and "worse over the years" and placed on BREO but worse fall  2018 so placed on Trelegy and referred to pulmonary clinic 01/28/2017 by Dr   [name]      History of Present Illness  01/28/2017 1st [facility] office visit/ [name]  ? Copd   Chief Complaint  Patient presents with  . Pulmonary Consult    Referred by Dr. Rae [name] for eval of COPD. Pt states that he has been having SOB for the past several yrs, worse for the past 3-6 months. He gets SOB with walking short distances and up and down stairs. He has CP that comes and goes. He also has some cough with white sputum.  He is using proair once daily on average.   doe x years much worse x sev months, cp is not related to breathing or exertion localized L ant chest/ lasts just a few secs Cough is worse since sob worse/ esp hs and early in am Baseline = 15 min walk outside mostly flat but struggles if gets in a hurry or steps  Thinks proair helps his cp but note resolves in 2 secs whether uses proair or not rec Continue the dexilant but for now take it Take 30- 60 min before your first and last meals of the day until cough and breathing better then just Take 30-60 min before first meal of the day  Stop Trelegy and lisinopril Losartan 50 mg only daily takes the place of lisinopril  Plan A = Automatic = symbicort 160 Take 2 puffs first thing in am and then another 2 puffs about 12 hours later.  Work on inhaler technique:   Plan B = Backup Only use your albuterol as a rescue medication        03/11/2017  f/u ov/[name] re: copd II/ brought meds  Chief Complaint  Patient presents with  . Follow-up    zpack helped with his cough. He occ produces some yellow sputum. He has not had to use his albuterol inhaler. He has some CP on  the left that comes and goes.   Dyspnea:  MMRC3 = can't walk 100 yards even at a slow pace at a flat grade s stopping due to sob   Cough: better p rx zpak Sleep: ok  SABA use:  No need for saba L cp  X years comes and goes / better p zpak "it was my nodules" - fleeting and no change with breathing or ex rec Keep your appointment for March 29 but call sooner if condition worsens  Change your cozar to where you take 50 mg x one pill twice daily for a total of 100mg  daily    04/04/2017  f/u ov/[name] re:  Copd GOLD II with marked reversibility on symb 160 2bid  Chief Complaint  Patient presents with  . Follow-up    Feels good but has chest pain that comes and goes.  Dyspnea:  MMRC2 = can't walk a nl pace on a flat grade s sob but does fine slow and flat eg Cough: no cough Sleep: most noct fine  SABA use:  Never  Cp same location as previous parasternal L upper lasts x no more than one-two secs 2-3 times a day  returned Jan 2019 vs many years prior to [place] same pattern attributed to "lung nodules'  rec Plan A = Automatic = Symbicort 160 Take 2 puffs first thing in am and then another 2 puffs about 12 hours later.  Work on inhaler technique:   Plan B = Backup Only use your albuterol as a rescue medication      11/04/2017  Acute extended ov/[name] re: cp/ sob  GOLD II copd  Chief Complaint  Patient presents with  . Acute Visit    Increased SOB and chest discomfort off and on x 2 wks. He is using his proair 1-2 x per wk.   Dyspnea:  Across parking lot x months Cough: no  Sleeping: better pain 2 pillows  SABA use: helps some  02: no   L Cp x years  comes and goes over seconds, better supine and always gone when wakes up in am rec Work on inhaler technique:  relax and gently blow all the way out then take a nice smooth deep breath back in, triggering the inhaler at same time you start breathing in.  Hold for up to 5 seconds if you can. Blow out thru nose. Rinse and gargle with water when  done Plan A = Automatic = symbicort 160 Take 2 puffs first thing in am and then another 2 puffs about 12 hours later.  Plan B = Backup Only use your albuterol as a rescue medication   Classic   pain pattern suggests ibs:      Please schedule a follow up office visit in 4 weeks, sooner if needed  with all medications /inhalers/ solutions in hand so we can verify exactly what you are taking. This includes all medications from all doctors and over the counters - PFTs on return  - note did not go to lab as req    04/08/18 pc Called and spoke with pt who stated he has had a cough with gray mucus x2-3 days. Pt stated he had a cold with clear postnasal drainage. Pt denies any fever. Pt has SOB which he said is due to the COPD and also states he has some tightness in chest and pain on left side. rec Prednisone 10 mg take  4 each am x 2 days,   2 each am x 2 days,  1 each am x 2 days and stop  zpak      05/20/2018 acute extended  ov/[name] re: copd/ recurrent cp x one month/ did not bring meds as requested   Chief Complaint  Patient presents with  . Acute Visit    Pt c/o left side CP and increased SOB x 1 month. He has had muscle aches for the past 6 wks.   Dyspnea:  Walks around park x 15 min daily never tries inhaler before walks, sob stops him and legs hurt but no cp with exertion  Cough: no Sleeping: on side bed is flat two pillows SABA use: bid avg  02: none  Cp comes and goes x years 2-3 min, always in same place / never supine/ did not follow IBS recs   No obvious day to day or daytime variability or assoc excess/ purulent sputum or mucus plugs or hemoptysis or  chest tightness, subjective wheeze or overt sinus or hb symptoms.   Sleeping  without nocturnal  or early am exacerbation  of respiratory  c/o's or need for noct saba. Also denies any obvious fluctuation of symptoms with weather or environmental changes or  other aggravating or alleviating factors except as outlined above   No  unusual exposure hx or h/o childhood pna/ asthma or knowledge of premature birth.  Current Allergies, Complete Past Medical History, Past Surgical History, Family History, and Social History were reviewed in [employer] record.  ROS  The following are not active complaints unless bolded Hoarseness, sore throat, dysphagia, dental problems, itching, sneezing,  nasal congestion or discharge of excess mucus or purulent secretions, ear ache,   fever, chills, sweats, unintended wt loss or wt gain, classically pleuritic or exertional cp,  orthopnea pnd or arm/hand swelling  or leg swelling, presyncope, palpitations, abdominal pain, anorexia, nausea, vomiting, diarrhea  or change in bowel habits or change in bladder habits, change in stools or change in urine, dysuria, hematuria,  rash, arthralgias, visual complaints, headache, numbness, weakness or ataxia or problems with walking or coordination,  change in mood or  memory.        Current Meds  Medication Sig  . atorvastatin (LIPITOR) 20 MG tablet Take 20 mg by mouth daily.  . budesonide-formoterol (SYMBICORT) 160-4.5 MCG/ACT inhaler INHALE 2 PUFFS BY MOUTH TWICE A DAY RINSE MOUTH AFTER USE.  . celecoxib (CELEBREX) 200 MG capsule Take 1 capsule (200 mg total) by mouth daily. (HOLD X 5 DAYS WHILE TAKING THE IBUPROFEN) (Patient taking differently: Take 200 mg by mouth daily. )  . clopidogrel (PLAVIX) 75 MG tablet Take 1 tablet (75 mg total) by mouth daily.  Marland Kitchen Dexlansoprazole (DEXILANT) 30 MG capsule Take 30- 60 min before your first and last meals of the day (Patient taking differently: Take 30 mg by mouth daily. )  . docusate sodium (COLACE) 100 MG capsule Take 100 mg by mouth as needed.   Marland Kitchen glipiZIDE (GLUCOTROL) 5 MG tablet Take by mouth.  Marland Kitchen HYDROcodone-acetaminophen (NORCO) 7.5-325 MG per tablet Take 1 tablet by mouth 2 (two) times daily as needed for moderate pain.   Marland Kitchen JANUVIA 100 MG tablet Take 100 mg by mouth daily.   Marland Kitchen  ketoconazole (NIZORAL) 2 % cream Apply to both feet once daily for 4 weeks  . losartan (COZAAR) 50 MG tablet Take 2 each am (Patient taking differently: Take 50 mg by mouth daily. Take 2 each am)  . metFORMIN (GLUCOPHAGE) 500 MG tablet Take 500 mg by mouth 2 (two) times daily with a meal.   . nitroGLYCERIN (NITROSTAT) 0.4 MG SL tablet Place 1 tablet (0.4 mg total) under the tongue every 5 (five) minutes as needed for chest pain.  . ONE TOUCH ULTRA TEST test strip   . PROAIR HFA 108 (90 BASE) MCG/ACT inhaler Inhale 2 puffs into the lungs every 6 (six) hours as needed for wheezing or shortness of breath.   Loura Pardon Salicylate (ASPERCREME EX) Apply topically as needed.  . vitamin C (ASCORBIC ACID) 500 MG tablet Take 500 mg by mouth daily.  . VOLTAREN 1 % GEL Apply 1 application topically daily as needed (pain).   Morrie Sheldon 5 MG TABS Take 1 tablet by mouth daily.  .                   Objective:   Physical Exam    amb anxious bm nad    05/20/2018       176 11/04/2017     180  04/04/2017       179  03/11/2017         180   02/13/2017        175  01/28/17 175 lb 12.8 oz (79.7 kg)  01/16/17 175 lb (79.4 kg)  11/01/16 170 lb (77.1 kg)      Vital signs reviewed - Note on arrival 02 sats  100% on RA      HEENT: nl dentition / oropharynx. Nl external ear canals without cough reflex -  Mild bilateral non-specific turbinate edema     NECK :  without JVD/Nodes/TM/ nl carotid upstrokes bilaterally   LUNGS: no acc muscle use,  Mod barrel  contour chest wall with bilateral  Distant bs s audible wheeze and  without cough on insp or exp maneuver and mod  Hyperresonant  to  percussion bilaterally     CV:  RRR  no s3 or murmur or increase in P2, and no edema   ABD:  soft and nontender with pos late  insp Hoover's  in the supine position. No bruits or organomegaly appreciated, bowel sounds nl  MS:   Nl gait/  ext warm without deformities, calf tenderness, cyanosis or clubbing No obvious  joint restrictions   SKIN: warm and dry without lesions    NEURO:  alert, approp, nl sensorium with  no motor or cerebellar deficits apparent.           CXR PA and Lateral:   05/20/2018 :    I personally reviewed images and agree with radiology impression as follows:    Chronic lung changes and emphysema without evidence of acute cardiopulmonary disease   Labs ordered/ reviewed:      Chemistry      Component Value Date/Time   NA 141 05/20/2018 1041   K 3.9 05/20/2018 1041   CL 107 05/20/2018 1041   CO2 25 05/20/2018 1041   BUN 14 05/20/2018 1041   CREATININE 0.85 05/20/2018 1041   CREATININE 0.87 12/09/2017 1132      Component Value Date/Time   CALCIUM 8.7 05/20/2018 1041   ALKPHOS 72 06/20/2010 0936   AST 16 12/09/2017 1132   ALT 16 12/09/2017 1132   BILITOT 0.5 12/09/2017 1132        Lab Results  Component Value Date   WBC 4.8 05/20/2018   HGB 12.8 (L) 05/20/2018   HCT 38.1 (L) 05/20/2018   MCV 87.9 05/20/2018   PLT 268.0 05/20/2018       EOS                                                               0.1                                    05/20/2018      Lab Results  Component Value Date   TSH 1.35 05/20/2018     Lab Results  Component Value Date   PROBNP 61.0 05/20/2018       Lab Results  Component Value Date   ESRSEDRATE 13 05/20/2018   ESRSEDRATE 2 06/06/2017              Assessment:

## 2018-05-20 NOTE — Patient Instructions (Addendum)
Plan A = Automatic = symbicort 160 Take 2 puffs first thing in am and then another 2 puffs about 12 hours later.  Add spiriva 2 pffs each am only      Plan B = Backup Only use your albuterol as a rescue medication to be used if you can't catch your breath by resting or doing a relaxed purse lip breathing pattern.  - The less you use it, the better it will work when you need it. - Ok to use the inhaler up to 2 puffs  every 4 hours if you must but call for appointment if use goes up over your usual need - Don't leave home without it !!  (think of it like the spare tire for your car)     Classic   pain pattern suggests ibs:  very limited distribution of pain locations, daytime, not usually exacerbated by exercise  or coughing, worse in sitting position, frequently associated with generalized abd bloating, not as likely to be present supine due to the dome effect of the diaphragm which  is  canceled in that position. Frequently these patients have had multiple negative GI workups and CT scans.  Treatment consists of avoiding foods that cause gas (especially boiled eggs, mexcican food but especially  beans and undercooked vegetables like  spinach and some salads)  and Citrucel 1 heaping tsp twice daily with a large glass of water.  Pain should improve w/in 2 weeks and if not then consider further GI work up.       Please remember to go to the lab and x-ray department downstairs in the basement  for your tests - we will call you with the results when they are available.     Please schedule a follow up office visit in  6 weeks, sooner if needed  with all medications /inhalers/ solutions in hand so we can verify exactly what you are taking. This includes all medications from all doctors and over the counters        .

## 2018-05-20 NOTE — Progress Notes (Signed)
Left detailed msg on machine ok per DPR

## 2018-05-22 ENCOUNTER — Encounter: Payer: Self-pay | Admitting: Internal Medicine

## 2018-05-22 ENCOUNTER — Telehealth: Payer: Self-pay | Admitting: Rheumatology

## 2018-05-22 NOTE — Assessment & Plan Note (Addendum)
Quit smoking 2013 PFT's 01/04/15  FEV1 2.15 (71 % ) ratio 58  p 14 % improvement from saba p ? prior to study with DLCO  70 % corrects to 94  % for alv volume  With only mild curvature f/v loop  - Spirometry 01/28/2017  FEV1 1.59 (53%)  Ratio 59 off all rx  - 01/28/2017    symb 160 2bid and d/c acei and double the dexilant > marked improvement 02/13/2017  PFT's  04/04/2017  FEV1 1.99 (68 % ) ratio 62  p 23 % improvement from saba p nothing prior to study with DLCO  63 % corrects to 82  % for alv volume   - 04/04/2017  After extensive coaching inhaler device  effectiveness =    90%  - 11/04/2017  Walked RA x 3 laps @ 185 ft each stopped due to  End of study, fast pace, no  Desat, mild sob  - 11/04/2017  After extensive coaching inhaler device,  effectiveness =    75% > restart symb 160 2bid - Spirometry 11/04/2017  FEV1 1.4 (47%)  Ratio 58 off all rx     - 05/20/2018  After extensive coaching inhaler device,  effectiveness = 75% with symb and 90% with SMI > try add spiriva 2 pffs each am   - 05/20/2018   Walked RA  2 laps @  approx 228ft each @ fast pace  stopped due to  End of study, no cp or sob or desats       Symptoms are markedly disproportionate to objective findings and not clear to what extent this is actually a pulmonary  problem but pt does appear to have difficult to sort out respiratory symptoms of unknown origin for which  DDX  = almost all start with A and  include Adherence, Ace Inhibitors, Acid Reflux, Active Sinus Disease, Alpha 1 Antitripsin deficiency, Anxiety masquerading as Airways dz,  ABPA,  Allergy(esp in young), Aspiration (esp in elderly), Adverse effects of meds,  Active smoking or Vaping, A bunch of PE's/clot burden (a few small clots can't cause this syndrome unless there is already severe underlying pulm or vascular dz with poor reserve),  Anemia or thyroid disorder, plus two Bs  = Bronchiectasis and Beta blocker use..and one C= CHF     Adherence is always the initial "prime  suspect" and is a multilayered concern that requires a "trust but verify" approach in every patient - starting with knowing how to use medications, especially inhalers, correctly, keeping up with refills and understanding the fundamental difference between maintenance and prns vs those medications only taken for a very short course and then stopped and not refilled.  - see hfa /smi teaching - return with all meds in hand using a trust but verify approach to confirm accurate Medication  Reconciliation The principal here is that until we are certain that the  patients are doing what we've asked, it makes no sense to ask them to do more.    ? Allergy/asthma > continue high dose symbicort  ? Anxiety related to airtrapping > usually at the bottom of this list of usual suspects but should be much higher on this pt's based on H and P and may interfere with adherence and also interpretation of response or lack thereof to symptom management which can be quite subjective.  >>> added spiriva trial today to see if helps reduce the cycle of air trapping causing anxiety which causes an increased respiratory rate which causes more  airtrapping and then more anxiety.  ? Acid (or non-acid) GERD > always difficult to exclude as up to 75% of pts in some series report no assoc GI/ Heartburn symptoms> rec continue max (24h)  acid suppression and diet restrictions/ reviewed     Anemia/ thyroid dz excluded by today's labs   ? chf /ihd > his BNP is normal and he does not have exertional chest pain.

## 2018-05-22 NOTE — Telephone Encounter (Signed)
-----   Message from Carole Binning, LPN sent at 01/07/6577  1:34 PM EDT ----- Please schedule patient for a follow up visit in 1-2 months. Patient was seen for a telemedicine visit. Thanks!

## 2018-05-22 NOTE — Assessment & Plan Note (Addendum)
Pattern of pain is again entirely consistent with irritable bowel syndrome versus air trapping which should be addressed with the Spiriva.  In the meantime of again advised him on appropriate diet and trial of Citrucel for the recurrent stereotypical chest pain he experiences.   I had an extended discussion with the patient reviewing all relevant studies completed to date and  lasting 25 minutes of a 40 minute acute office visit    See device teaching and  Direct  Observation of ambulatory 02 saturation study/  which extended face to face time for this visit.  Each maintenance medication was reviewed in detail including emphasizing most importantly the difference between maintenance and prns and under what circumstances the prns are to be triggered using an action plan format that is not reflected in the computer generated alphabetically organized AVS which I have not found useful in most complex patients, especially with respiratory illnesses  Please see AVS for specific instructions unique to this visit that I personally wrote and verbalized to the the pt in detail and then reviewed with pt  by my nurse highlighting any  changes in therapy recommended at today's visit to their plan of care.

## 2018-05-22 NOTE — Telephone Encounter (Signed)
LMOM for patient to call and schedule follow-up appointment.   °

## 2018-05-26 ENCOUNTER — Telehealth: Payer: Self-pay | Admitting: Rheumatology

## 2018-05-26 NOTE — Telephone Encounter (Signed)
-----   Message from Carole Binning, LPN sent at 06/09/4467  1:34 PM EDT ----- Please schedule patient for a follow up visit in 1-2 months. Patient was seen for a telemedicine visit. Thanks!

## 2018-05-26 NOTE — Telephone Encounter (Signed)
I LMOM for patient to call, and schedule his next follow up appt with Dr. Estanislado Pandy around 6/11-7/11/20.

## 2018-05-27 ENCOUNTER — Telehealth (HOSPITAL_COMMUNITY): Payer: Self-pay | Admitting: Rehabilitation

## 2018-05-27 NOTE — Telephone Encounter (Signed)
The above patient or their representative was contacted and gave the following answers to these questions:         Do you have any of the following symptoms? No  Fever                    Cough                   Shortness of breath  Do  you have any of the following other symptoms? No   muscle pain         vomiting,        diarrhea        rash         weakness        red eye        abdominal pain         bruising          bruising or bleeding              joint pain           severe headache    Have you been in contact with someone who was or has been sick in the past 2 weeks? No  Yes                 Unsure                         Unable to assess   Does the person that you were in contact with have any of the following symptoms?   Cough         shortness of breath           muscle pain         vomiting,            diarrhea            rash            weakness           fever            red eye           abdominal pain           bruising  or  bleeding                joint pain                severe headache               Have you  or someone you have been in contact with traveled internationally in th last month? No        If yes, which countries?   Have you  or someone you have been in contact with traveled outside [place] in th last month? No         If yes, which state and city?   COMMENTS OR ACTION PLAN FOR THIS PATIENT:          

## 2018-05-28 ENCOUNTER — Other Ambulatory Visit: Payer: Self-pay

## 2018-05-28 ENCOUNTER — Ambulatory Visit (HOSPITAL_COMMUNITY)
Admission: RE | Admit: 2018-05-28 | Discharge: 2018-05-28 | Disposition: A | Discharge status: Home / Self Care | Payer: Medicare Other | Source: Ambulatory Visit | Attending: Family | Admitting: Family

## 2018-05-28 ENCOUNTER — Ambulatory Visit (INDEPENDENT_AMBULATORY_CARE_PROVIDER_SITE_OTHER): Payer: Medicare Other | Admitting: Family

## 2018-05-28 ENCOUNTER — Encounter: Payer: Self-pay | Admitting: Family

## 2018-05-28 ENCOUNTER — Ambulatory Visit (INDEPENDENT_AMBULATORY_CARE_PROVIDER_SITE_OTHER)
Admission: RE | Admit: 2018-05-28 | Discharge: 2018-05-28 | Disposition: A | Discharge status: Home / Self Care | Payer: Medicare Other | Source: Ambulatory Visit | Attending: Family | Admitting: Family

## 2018-05-28 VITALS — BP 145/91 | HR 86 | Temp 97.8°F | Resp 16 | Ht 70.0 in | Wt 172.0 lb

## 2018-05-28 DIAGNOSIS — I779 Disorder of arteries and arterioles, unspecified: Secondary | ICD-10-CM

## 2018-05-28 DIAGNOSIS — Z87891 Personal history of nicotine dependence: Secondary | ICD-10-CM

## 2018-05-28 DIAGNOSIS — Z95828 Presence of other vascular implants and grafts: Secondary | ICD-10-CM | POA: Diagnosis not present

## 2018-05-28 NOTE — Patient Instructions (Addendum)
Peripheral Vascular Disease  Peripheral vascular disease (PVD) is a disease of the blood vessels that are not part of your heart and brain. A simple term for PVD is poor circulation. In most cases, PVD narrows the blood vessels that carry blood from your heart to the rest of your body. This can reduce the supply of blood to your arms, legs, and internal organs, like your stomach or kidneys. However, PVD most often affects a person's lower legs and feet. Without treatment, PVD tends to get worse. PVD can also lead to acute ischemic limb. This is when an arm or leg suddenly cannot get enough blood. This is a medical emergency. Follow these instructions at home: Lifestyle  Do not use any products that contain nicotine or tobacco, such as cigarettes and e-cigarettes. If you need help quitting, ask your doctor.  Lose weight if you are overweight. Or, stay at a healthy weight as told by your doctor.  Eat a diet that is low in fat and cholesterol. If you need help, ask your doctor.  Exercise regularly. Ask your doctor for activities that are right for you. General instructions  Take over-the-counter and prescription medicines only as told by your doctor.  Take good care of your feet: ? Wear comfortable shoes that fit well. ? Check your feet often for any cuts or sores.  Keep all follow-up visits as told by your doctor This is important. Contact a doctor if:  You have cramps in your legs when you walk.  You have leg pain when you are at rest.  You have coldness in a leg or foot.  Your skin changes.  You are unable to get or have an erection (erectile dysfunction).  You have cuts or sores on your feet that do not heal. Get help right away if:  Your arm or leg turns cold, numb, and blue.  Your arms or legs become red, warm, swollen, painful, or numb.  You have chest pain.  You have trouble breathing.  You suddenly have weakness in your face, arm, or leg.  You become very  confused or you cannot speak.  You suddenly have a very bad headache.  You suddenly cannot see. Summary  Peripheral vascular disease (PVD) is a disease of the blood vessels.  A simple term for PVD is poor circulation. Without treatment, PVD tends to get worse.  Treatment may include exercise, low fat and low cholesterol diet, and quitting smoking. This information is not intended to replace advice given to you by your health care provider. Make sure you discuss any questions you have with your health care provider. Document Released: 03/20/2009 Document Revised: 02/01/2016 Document Reviewed: 02/01/2016 Elsevier Interactive Patient Education  2019 [employer].   For cramping of legs at night: can try over the counter magnesium oxide, 500 mg tablet at bedtime, can find in the vitamin aisle.
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
## 2018-06-12 NOTE — Progress Notes (Deleted)
Office Visit Note  Patient: [name]             Date of Birth: 1950/01/23           MRN: [id number]             PCP: [name], MD Referring: [name], MD Visit Date: 06/26/2018 Occupation: @GUAROCC @  Subjective:  No chief complaint on file.   History of Present Illness: [name] is a 68 y.o. male ***   Activities of Daily Living:  Patient reports morning stiffness for *** {minute/hour:19697}.   Patient {ACTIONS;DENIES/REPORTS:21021675::"Denies"} nocturnal pain.  Difficulty dressing/grooming: {ACTIONS;DENIES/REPORTS:21021675::"Denies"} Difficulty climbing stairs: {ACTIONS;DENIES/REPORTS:21021675::"Denies"} Difficulty getting out of chair: {ACTIONS;DENIES/REPORTS:21021675::"Denies"} Difficulty using hands for taps, buttons, cutlery, and/or writing: {ACTIONS;DENIES/REPORTS:21021675::"Denies"}  No Rheumatology ROS completed.   PMFS History:  Patient Active Problem List   Diagnosis Date Noted   DOE (dyspnea on exertion) 11/04/2017   Muscle cramps 07/04/2017   Contracture of left elbow 07/04/2017   High risk medication use 06/23/2017   Primary osteoarthritis of both hands 06/23/2017   Primary osteoarthritis of both feet 06/23/2017   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 factor ([place]) 06/23/2017   DDD (degenerative disc disease), cervical 06/23/2017   Rotator cuff syndrome of right shoulder 06/06/2017   History of  bilateral carpal tunnel release 06/06/2017   Ganglion cyst of wrist, left 06/06/2017   Type 2 diabetes mellitus with stage 3 chronic kidney disease, without long-term current use of insulin ([place]) 06/06/2017   Chest pain, musculoskeletal  vs IBS 04/05/2017   COPD with acute exacerbation ([place]) 03/13/2017   Plantar fasciitis 02/21/2017   Iliac artery stenosis, right ([place]) 02/12/2017   Hematochezia 02/10/2017   Abnormal CT of the abdomen 02/10/2017   Cervical spondylosis 10/11/2016   Bony growth  10/13/2015   Pain in joint, lower leg 03/02/2014   Discoloration of skin-Left dorsum foot 09/28/2013   Swelling of limb-Left Calf / Leg 09/28/2013   PVD (peripheral vascular disease) ([place]) 10/14/2012   Pain in limb- Left popliteal and calf 10/14/2012   Hyperlipidemia    Essential hypertension 03/05/2012   Secondary cardiomyopathy, 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/12/2011   Generalized osteoarthritis of multiple sites 02/06/2011   History of arterial bypass of lower limb 12/25/2010   Atherosclerosis of native arteries of extremity with intermittent claudication ([place]) 12/25/2010   Coronary atherosclerosis of native coronary artery 04/16/2008   PERIPHERAL VASCULAR DISEASE 04/16/2008   COPD  GOLD II 04/16/2008   GASTROESOPHAGEAL REFLUX DISEASE 04/16/2008   RHEUMATOID ARTHRITIS 04/16/2008   DIABETES MELLITUS, BORDERLINE 04/16/2008    Past Medical History:  Diagnosis Date   Arthritis    RA   Bronchitis    Carotid artery occlusion    Cervical spine disease    Neck surgery in 2007 with Dr. Joya Salm   COPD (chronic obstructive pulmonary disease) ([place])    Coronary atherosclerosis of native coronary artery    Nonobstrtuctive by catheterization 2007 and 2013   DVT (deep venous thrombosis) ([place])    Essential hypertension, benign    GERD (gastroesophageal reflux disease)    Hyperlipidemia    PAD (peripheral artery disease) ([place])    s/p left fem-pop bypass 2007; PTA and stenting of right iliac artery 09/2011   Rheumatoid arthritis(714.0)    Type 2 diabetes mellitus ([place])     Family History  Problem Relation Age of Onset   Heart attack Mother 73   Deep vein thrombosis Mother    Diabetes Mother    Hyperlipidemia Mother  Hypertension Mother    Peripheral vascular disease Mother    Stroke Father    Diabetes Father    Hyperlipidemia Father    Hypertension Father    Peripheral vascular disease Father    Diabetes Sister    Hyperlipidemia Sister    Hypertension  Sister    Diabetes Brother    Hyperlipidemia Brother    Cancer Brother    Deep vein thrombosis Brother    Hypertension Brother    Peripheral vascular disease Brother    Hyperlipidemia Son    Cancer Son    Hypertension Son    Colon cancer Neg Hx    Past Surgical History:  Procedure Laterality Date   ABDOMINAL AORTAGRAM N/A 10/03/2011   Procedure: ABDOMINAL Maxcine Ham;  Surgeon: Conrad West Livingston, MD;  Location: [facility] CATH LAB;  Service: Cardiovascular;  Laterality: N/A;   ABDOMINAL AORTOGRAM W/LOWER EXTREMITY N/A 01/16/2017   Procedure: ABDOMINAL AORTOGRAM W/LOWER EXTREMITY;  Surgeon: Conrad Beauregard, MD;  Location: [facility] CV LAB;  Service: Cardiovascular;  Laterality: N/A;   CERVICAL DISC SURGERY     COLONOSCOPY WITH PROPOFOL N/A 02/17/2017   Procedure: COLONOSCOPY WITH PROPOFOL;  Surgeon: Daneil Dolin, MD;  Location[facility];  Service: Endoscopy;  Laterality: N/A;  2:45pm   FEMORAL-POPLITEAL BYPASS GRAFT  11/06/2005   ILIAC ARTERY STENT  10-03-11   Right CIA stenting   PERIPHERAL VASCULAR BALLOON ANGIOPLASTY Right 01/16/2017   Procedure: PERIPHERAL VASCULAR BALLOON ANGIOPLASTY;  Surgeon: Conrad Oakwood, MD;  Location: [facility] LAB;  Service: Cardiovascular;  Laterality: Right;  common iliac    PR VEIN BYPASS GRAFT,AORTO-FEM-POP  06-22-2010   Redo Left Fem-Pop   Repair of left arm fracture     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    Immunization History  Administered Date(s) Administered   Influenza, High Dose Seasonal PF 10/14/2016   Influenza-Unspecified 11/08/2008   PPD Test 09/22/2007     Objective: Vital Signs: There were no vitals taken for this visit.   Physical Exam   Musculoskeletal Exam: ***  CDAI Exam: CDAI Score: Not documented Patient Global Assessment: Not documented; Provider Global Assessment: Not documented Swollen: Not  documented; Tender: Not documented Joint Exam   Not documented   There is currently no information documented on the homunculus. Go to the Rheumatology activity and complete the homunculus joint exam.  Investigation: No additional findings.  Imaging: Dg Chest 2 View  Result Date: 05/20/2018 CLINICAL DATA:  68 year old male with shortness of breath EXAM: CHEST - 2 VIEW COMPARISON:  11/04/2017 FINDINGS: Cardiomediastinal silhouette within normal limits in size and contour. No evidence of central vascular congestion. No pneumothorax or pleural effusion. No confluent airspace disease. Stigmata of emphysema, with increased retrosternal airspace, flattened hemidiaphragms, increased AP diameter, and hyperinflation on the AP view. Coarsened interstitial markings. Degenerative changes of the shoulders. Surgical changes of the cervical region IMPRESSION: Chronic lung changes and emphysema without evidence of acute cardiopulmonary disease Electronically Signed   By: Corrie Mckusick D.O.   On: 05/20/2018 15:31   Vas [place] Burnard Bunting With/wo Tbi  Result Date: 05/28/2018 LOWER EXTREMITY DOPPLER STUDY Indications: Peripheral artery disease. High Risk         Hypertension, hyperlipidemia, Diabetes, past history of Factors:          smoking.  Vascular Interventions: Right CIA angioplasty 10/02/2011. Redo left  femoral to                         popliteal bypass graft 2012. Comparison Study: ABIs comparable to prior exam of 02/26/2018. Increase in                   bilateral toe-brachial indeces. Performing Technologist: Alvia Grove RVT  Examination Guidelines: A complete evaluation includes at minimum, Doppler waveform signals and systolic blood pressure reading at the level of bilateral brachial, anterior tibial, and posterior tibial arteries, when vessel segments are accessible. Bilateral testing is considered an integral part of a complete examination. Photoelectric Plethysmograph (PPG) waveforms and toe systolic pressure  readings are included as required and additional duplex testing as needed. Limited examinations for reoccurring indications may be performed as noted.  ABI Findings: +---------+------------------+-----+---------+--------+  Right     Rt Pressure (mmHg) Index Waveform  Comment   +---------+------------------+-----+---------+--------+  Brachial  148                                          +---------+------------------+-----+---------+--------+  PTA       152                1.03  biphasic            +---------+------------------+-----+---------+--------+  DP        >254               1.72  triphasic           +---------+------------------+-----+---------+--------+  Great Toe 117                0.79  Normal              +---------+------------------+-----+---------+--------+ +---------+------------------+-----+---------+-------+  Left      Lt Pressure (mmHg) Index Waveform  Comment  +---------+------------------+-----+---------+-------+  Brachial  136                                         +---------+------------------+-----+---------+-------+  PTA       177                1.20                     +---------+------------------+-----+---------+-------+  DP        190                1.28  triphasic          +---------+------------------+-----+---------+-------+  Great Toe 103                0.70  Normal             +---------+------------------+-----+---------+-------+ +-------+-----------+-----------+------------+------------+  ABI/TBI Today's ABI Today's TBI Previous ABI Previous TBI  +-------+-----------+-----------+------------+------------+  Right   [place]          0.79        [place]           0.64          +-------+-----------+-----------+------------+------------+  Left    1.28        0.70        1.11         0.56          +-------+-----------+-----------+------------+------------+  Summary: Right: Resting right ankle-brachial index indicates noncompressible right lower extremity artery using the dorsalis pedis..The right  toe-brachial index is normal. Left: Resting left ankle-brachial index is within normal range. No evidence of significant left lower extremity arterial disease. The left toe-brachial index is normal.  *See table(s) above for measurements and observations.  Electronically signed by Ruta Hinds MD on 05/28/2018 at 10:12:19 AM.    Final    Vas [place] Lower Extremity Arterial Duplex  Result Date: 05/28/2018 LOWER EXTREMITY ARTERIAL DUPLEX STUDY Indications: Peripheral artery disease. High Risk Factors: Hypertension, hyperlipidemia, Diabetes, past history of                    smoking.  Vascular Interventions: Right CIA angioplasty 10/02/2011. Redo left femoral to                         popliteal bypass graft 2012. Current ABI:            Right [place] Left 1.28 Performing Technologist: Alvia Grove RVT  Examination Guidelines: A complete evaluation includes B-mode imaging, spectral Doppler, color Doppler, and power Doppler as needed of all accessible portions of each vessel. Bilateral testing is considered an integral part of a complete examination. Limited examinations for reoccurring indications may be performed as noted.  Left Graft #1: +--------------------+--------+--------+---------+--------+                       PSV cm/s Stenosis Waveform  Comments  +--------------------+--------+--------+---------+--------+  Inflow               82                triphasic           +--------------------+--------+--------+---------+--------+  Proximal Anastomosis 97                triphasic           +--------------------+--------+--------+---------+--------+  Proximal Graft       74                triphasic           +--------------------+--------+--------+---------+--------+  Mid Graft            52                triphasic           +--------------------+--------+--------+---------+--------+  Distal Graft         61                biphasic            +--------------------+--------+--------+---------+--------+  Distal Anastamosis    41                biphasic            +--------------------+--------+--------+---------+--------+  Outflow              48                biphasic            +--------------------+--------+--------+---------+--------+  Summary: Left: Patent left femoral to popliteal bypass graft with no evidence for restenosis.  See table(s) above for measurements and observations. Electronically signed by Ruta Hinds MD on 05/28/2018 at 10:11:38 AM.    Final     Recent Labs: Lab Results  Component Value Date   WBC 4.8 05/20/2018   HGB 12.8 (L) 05/20/2018   PLT  268.0 05/20/2018   NA 141 05/20/2018   K 3.9 05/20/2018   CL 107 05/20/2018   CO2 25 05/20/2018   GLUCOSE 208 (H) 05/20/2018   BUN 14 05/20/2018   CREATININE 0.85 05/20/2018   BILITOT 0.5 12/09/2017   ALKPHOS 72 06/20/2010   AST 16 12/09/2017   ALT 16 12/09/2017   PROT 6.7 12/09/2017   ALBUMIN 4.0 06/20/2010   CALCIUM 8.7 05/20/2018   GFRAA 104 12/09/2017   QFTBGOLDPLUS NEGATIVE 06/06/2017    Speciality Comments: Prior therapy includes: methotrexate ( d/c alcohol use), Arava (d/c patient preference)  Procedures:  No procedures performed Allergies: Patient has no known allergies.   Assessment / Plan:     Visit Diagnoses: 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 factor (HCC)  High risk medication use  Contracture of left elbow  Primary osteoarthritis of both hands  Primary osteoarthritis of both feet  DDD (degenerative disc disease), cervical  Rotator cuff syndrome of right shoulder  Muscle cramps  History of  bilateral carpal tunnel release  Ganglion cyst of wrist, left  Type 2 diabetes mellitus with stage 3 chronic kidney disease, without long-term current use of insulin (HCC)  PVD (peripheral vascular disease) ([place])  Essential hypertension  COPD  GOLD II  Mixed hyperlipidemia  Atherosclerosis of native coronary artery of native heart without angina pectoris  Elevated CK    Orders: No orders of the defined types were placed in this encounter.  No orders of the defined types were placed in this encounter.   Face-to-face time spent with patient was *** minutes. Greater than 50% of time was spent in counseling and coordination of care.  Follow-Up Instructions: No follow-ups on file.   Ofilia Neas, PA-C  Note - This record has been created using Dragon software.  Chart creation errors have been sought, but may not always  have been located. Such creation errors do not reflect on  the standard of medical care.

## 2018-06-16 ENCOUNTER — Ambulatory Visit (INDEPENDENT_AMBULATORY_CARE_PROVIDER_SITE_OTHER): Payer: Medicare Other | Admitting: Podiatry

## 2018-06-16 ENCOUNTER — Other Ambulatory Visit: Payer: Self-pay

## 2018-06-16 ENCOUNTER — Encounter: Payer: Self-pay | Admitting: Podiatry

## 2018-06-16 VITALS — Temp 97.7°F

## 2018-06-16 DIAGNOSIS — M79675 Pain in left toe(s): Secondary | ICD-10-CM | POA: Diagnosis not present

## 2018-06-16 DIAGNOSIS — E119 Type 2 diabetes mellitus without complications: Secondary | ICD-10-CM

## 2018-06-16 DIAGNOSIS — L84 Corns and callosities: Secondary | ICD-10-CM

## 2018-06-16 DIAGNOSIS — M79674 Pain in right toe(s): Secondary | ICD-10-CM

## 2018-06-16 DIAGNOSIS — E1151 Type 2 diabetes mellitus with diabetic peripheral angiopathy without gangrene: Secondary | ICD-10-CM

## 2018-06-16 DIAGNOSIS — B351 Tinea unguium: Secondary | ICD-10-CM | POA: Diagnosis not present

## 2018-06-16 NOTE — Patient Instructions (Signed)
Diabetes Mellitus and Foot Care  Foot care is an important part of your health, especially when you have diabetes. Diabetes may cause you to have problems because of poor blood flow (circulation) to your feet and legs, which can cause your skin to:   Become thinner and drier.   Break more easily.   Heal more slowly.   Peel and crack.  You may also have nerve damage (neuropathy) in your legs and feet, causing decreased feeling in them. This means that you may not notice minor injuries to your feet that could lead to more serious problems. Noticing and addressing any potential problems early is the best way to prevent future foot problems.  How to care for your feet  Foot hygiene   Wash your feet daily with warm water and mild soap. Do not use hot water. Then, pat your feet and the areas between your toes until they are completely dry. Do not soak your feet as this can dry your skin.   Trim your toenails straight across. Do not dig under them or around the cuticle. File the edges of your nails with an emery board or nail file.   Apply a moisturizing lotion or petroleum jelly to the skin on your feet and to dry, brittle toenails. Use lotion that does not contain alcohol and is unscented. Do not apply lotion between your toes.  Shoes and socks   Wear clean socks or stockings every day. Make sure they are not too tight. Do not wear knee-high stockings since they may decrease blood flow to your legs.   Wear shoes that fit properly and have enough cushioning. Always look in your shoes before you put them on to be sure there are no objects inside.   To break in new shoes, wear them for just a few hours a day. This prevents injuries on your feet.  Wounds, scrapes, corns, and calluses   Check your feet daily for blisters, cuts, bruises, sores, and redness. If you cannot see the bottom of your feet, use a mirror or ask someone for help.   Do not cut corns or calluses or try to remove them with medicine.   If you  find a minor scrape, cut, or break in the skin on your feet, keep it and the skin around it clean and dry. You may clean these areas with mild soap and water. Do not clean the area with peroxide, alcohol, or iodine.   If you have a wound, scrape, corn, or callus on your foot, look at it several times a day to make sure it is healing and not infected. Check for:  ? Redness, swelling, or pain.  ? Fluid or blood.  ? Warmth.  ? Pus or a bad smell.  General instructions   Do not cross your legs. This may decrease blood flow to your feet.   Do not use heating pads or hot water bottles on your feet. They may burn your skin. If you have lost feeling in your feet or legs, you may not know this is happening until it is too late.   Protect your feet from hot and cold by wearing shoes, such as at the beach or on hot pavement.   Schedule a complete foot exam at least once a year (annually) or more often if you have foot problems. If you have foot problems, report any cuts, sores, or bruises to your health care provider immediately.  Contact a health care provider if:     You have a medical condition that increases your risk of infection and you have any cuts, sores, or bruises on your feet.   You have an injury that is not healing.   You have redness on your legs or feet.   You feel burning or tingling in your legs or feet.   You have pain or cramps in your legs and feet.   Your legs or feet are numb.   Your feet always feel cold.   You have pain around a toenail.  Get help right away if:   You have a wound, scrape, corn, or callus on your foot and:  ? You have pain, swelling, or redness that gets worse.  ? You have fluid or blood coming from the wound, scrape, corn, or callus.  ? Your wound, scrape, corn, or callus feels warm to the touch.  ? You have pus or a bad smell coming from the wound, scrape, corn, or callus.  ? You have a fever.  ? You have a red line going up your leg.  Summary   Check your feet every day  for cuts, sores, red spots, swelling, and blisters.   Moisturize feet and legs daily.   Wear shoes that fit properly and have enough cushioning.   If you have foot problems, report any cuts, sores, or bruises to your health care provider immediately.   Schedule a complete foot exam at least once a year (annually) or more often if you have foot problems.  This information is not intended to replace advice given to you by your health care provider. Make sure you discuss any questions you have with your health care provider.  Document Released: 12/22/1999 Document Revised: 02/05/2017 Document Reviewed: 01/26/2016  [employer]vier Interactive Patient Education  2019 [employer]

## 2018-06-19 ENCOUNTER — Ambulatory Visit: Payer: Medicare Other | Admitting: Podiatry

## 2018-06-24 NOTE — Progress Notes (Deleted)
Office Visit Note  Patient: [name]             Date of Birth: 1950-07-14           MRN: [id number]             PCP: [name], MD Referring: [name], MD Visit Date: 07/07/2018 Occupation: @GUAROCC @  Subjective:  No chief complaint on file.   Xeljanz 5 mg 1 tablet daily.  Last TB gold negative on 06/06/2017.  TB gold ordered for today and will monitor yearly.  Last lipid panel showed mildly elevated LDL on 06/06/2017.  Due for lipid panel today and will monitor yearly.  Most recent CBC stable and BMP within normal limits on 05/20/2018.  Last CMP showed normal liver enzymes on 12/10/2018.  Due for hepatic panel today.  Due for CBC/CMP in August and will monitor every 5 months.  Standing orders placed. Recommend annual influenza, Pneumovax 23, Prevnar 13, and Shingrix as indicated for immunosuppressant therapy.    History of Present Illness: [name] is a 68 y.o. male ***   Activities of Daily Living:  Patient reports morning stiffness for *** {minute/hour:19697}.   Patient {ACTIONS;DENIES/REPORTS:21021675::"Denies"} nocturnal pain.  Difficulty dressing/grooming: {ACTIONS;DENIES/REPORTS:21021675::"Denies"} Difficulty climbing stairs: {ACTIONS;DENIES/REPORTS:21021675::"Denies"} Difficulty getting out of chair: {ACTIONS;DENIES/REPORTS:21021675::"Denies"} Difficulty using hands for taps, buttons, cutlery, and/or writing: {ACTIONS;DENIES/REPORTS:21021675::"Denies"}  No Rheumatology ROS completed.   PMFS History:  Patient Active Problem List   Diagnosis Date Noted   DOE (dyspnea on exertion) 11/04/2017   Muscle cramps 07/04/2017   Contracture of left elbow 07/04/2017   High risk medication use 06/23/2017   Primary osteoarthritis of both hands 06/23/2017   Primary osteoarthritis of both feet 06/23/2017   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 factor ([place]) 06/23/2017   DDD (degenerative disc disease), cervical 06/23/2017   Rotator  cuff syndrome of right shoulder 06/06/2017   History of  bilateral carpal tunnel release 06/06/2017   Ganglion cyst of wrist, left 06/06/2017   Type 2 diabetes mellitus with stage 3 chronic kidney disease, without long-term current use of insulin ([place]) 06/06/2017   Chest pain, musculoskeletal  vs IBS 04/05/2017   COPD with acute exacerbation ([place]) 03/13/2017   Plantar fasciitis 02/21/2017   Iliac artery stenosis, right ([place]) 02/12/2017   Hematochezia 02/10/2017   Abnormal CT of the abdomen 02/10/2017   Cervical spondylosis 10/11/2016   Bony growth 10/13/2015   Pain in joint, lower leg 03/02/2014   Discoloration of skin-Left dorsum foot 09/28/2013   Swelling of limb-Left Calf / Leg 09/28/2013   PVD (peripheral vascular disease) ([place]) 10/14/2012   Pain in limb- Left popliteal and calf 10/14/2012   Hyperlipidemia    Essential hypertension 03/05/2012   Secondary cardiomyopathy, 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/12/2011   Generalized osteoarthritis of multiple sites 02/06/2011   History of arterial bypass of lower limb 12/25/2010   Atherosclerosis of native arteries of extremity with intermittent claudication ([place]) 12/25/2010   Coronary atherosclerosis of native coronary artery 04/16/2008   PERIPHERAL VASCULAR DISEASE 04/16/2008   COPD  GOLD II 04/16/2008   GASTROESOPHAGEAL REFLUX DISEASE 04/16/2008   RHEUMATOID ARTHRITIS 04/16/2008   DIABETES MELLITUS, BORDERLINE 04/16/2008    Past Medical History:  Diagnosis Date   Arthritis    RA   Bronchitis    Carotid artery occlusion    Cervical spine disease    Neck surgery in 2007 with Dr. Joya Salm   COPD (chronic obstructive pulmonary disease) ([place])    Coronary atherosclerosis of native coronary artery  Nonobstrtuctive by catheterization 2007 and 2013   DVT (deep venous thrombosis) ([place])    Essential hypertension, benign    GERD (gastroesophageal reflux disease)    Hyperlipidemia    PAD (peripheral artery disease)  ([place])    s/p left fem-pop bypass 2007; PTA and stenting of right iliac artery 09/2011   Rheumatoid arthritis(714.0)    Type 2 diabetes mellitus ([place])     Family History  Problem Relation Age of Onset   Heart attack Mother 49   Deep vein thrombosis Mother    Diabetes Mother    Hyperlipidemia Mother    Hypertension Mother    Peripheral vascular disease Mother    Stroke Father    Diabetes Father    Hyperlipidemia Father    Hypertension Father    Peripheral vascular disease Father    Diabetes Sister    Hyperlipidemia Sister    Hypertension Sister    Diabetes Brother    Hyperlipidemia Brother    Cancer Brother    Deep vein thrombosis Brother    Hypertension Brother    Peripheral vascular disease Brother    Hyperlipidemia Son    Cancer Son    Hypertension Son    Colon cancer Neg Hx    Past Surgical History:  Procedure Laterality Date   ABDOMINAL AORTAGRAM N/A 10/03/2011   Procedure: ABDOMINAL Maxcine Ham;  Surgeon: Conrad Terry, MD;  Location: [facility] CATH LAB;  Service: Cardiovascular;  Laterality: N/A;   ABDOMINAL AORTOGRAM W/LOWER EXTREMITY N/A 01/16/2017   Procedure: ABDOMINAL AORTOGRAM W/LOWER EXTREMITY;  Surgeon: Conrad Woodbine, MD;  Location: [facility] CV LAB;  Service: Cardiovascular;  Laterality: N/A;   CERVICAL DISC SURGERY     COLONOSCOPY WITH PROPOFOL N/A 02/17/2017   Procedure: COLONOSCOPY WITH PROPOFOL;  Surgeon: Daneil Dolin, MD;  Location: [facility] ENDO SU[facility];  Service: Endoscopy;  Laterality: N/A;  2:45pm   FEMORAL-POPLITEAL BYPASS GRAFT  11/06/2005   ILIAC ARTERY STENT  10-03-11   Right CIA stenting   PERIPHERAL VASCULAR BALLOON ANGIOPLASTY Right 01/16/2017   Procedure: PERIPHERAL VASCULAR BALLOON ANGIOPLASTY;  Surgeon: Conrad [place], MD;  Location: [facility] CV LAB;  Service: Cardiovascular;  Laterality: Right;  common iliac    PR VEIN BYPASS GRAFT,AORTO-FEM-POP  06-22-2010   Redo Left Fem-Pop   Repair of left arm fracture      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    Immunization History  Administered Date(s) Administered   Influenza, High Dose Seasonal PF 10/14/2016   Influenza-Unspecified 11/08/2008   PPD Test 09/22/2007     Objective: Vital Signs: There were no vitals taken for this visit.   Physical Exam   Musculoskeletal Exam: ***  CDAI Exam: CDAI Score: -- Patient Global: --; Provider Global: -- Swollen: --; Tender: -- Joint Exam   No joint exam has been documented for this visit   There is currently no information documented on the homunculus. Go to the Rheumatology activity and complete the homunculus joint exam.  Investigation: No additional findings.  Imaging: Vas [place] Abi With/wo Tbi  Result Date: 05/28/2018 LOWER EXTREMITY DOPPLER STUDY Indications: Peripheral artery disease. High Risk         Hypertension, hyperlipidemia, Diabetes, past history of Factors:          smoking.  Vascular Interventions: Right CIA angioplasty 10/02/2011. Redo left femoral to  popliteal bypass graft 2012. Comparison Study: ABIs comparable to prior exam of 02/26/2018. Increase in                   bilateral toe-brachial indeces. Performing Technologist: Alvia Grove RVT  Examination Guidelines: A complete evaluation includes at minimum, Doppler waveform signals and systolic blood pressure reading at the level of bilateral brachial, anterior tibial, and posterior tibial arteries, when vessel segments are accessible. Bilateral testing is considered an integral part of a complete examination. Photoelectric Plethysmograph (PPG) waveforms and toe systolic pressure readings are included as required and additional duplex testing as needed. Limited examinations for reoccurring indications may be performed as noted.  ABI Findings: +---------+------------------+-----+---------+--------+  Right     Rt  Pressure (mmHg) Index Waveform  Comment   +---------+------------------+-----+---------+--------+  Brachial  148                                          +---------+------------------+-----+---------+--------+  PTA       152                1.03  biphasic            +---------+------------------+-----+---------+--------+  DP        >254               1.72  triphasic           +---------+------------------+-----+---------+--------+  Great Toe 117                0.79  Normal              +---------+------------------+-----+---------+--------+ +---------+------------------+-----+---------+-------+  Left      Lt Pressure (mmHg) Index Waveform  Comment  +---------+------------------+-----+---------+-------+  Brachial  136                                         +---------+------------------+-----+---------+-------+  PTA       177                1.20                     +---------+------------------+-----+---------+-------+  DP        190                1.28  triphasic          +---------+------------------+-----+---------+-------+  Great Toe 103                0.70  Normal             +---------+------------------+-----+---------+-------+ +-------+-----------+-----------+------------+------------+  ABI/TBI Today's ABI Today's TBI Previous ABI Previous TBI  +-------+-----------+-----------+------------+------------+  Right   [place]          0.79        [place]           0.64          +-------+-----------+-----------+------------+------------+  Left    1.28        0.70        1.11         0.56          +-------+-----------+-----------+------------+------------+  Summary: Right: Resting right ankle-brachial index indicates noncompressible right lower extremity artery using the dorsalis pedis..The right toe-brachial index is normal. Left: Resting left ankle-brachial  index is within normal range. No evidence of significant left lower extremity arterial disease. The left toe-brachial index is normal.  *See table(s) above for  measurements and observations.  Electronically signed by Ruta Hinds MD on 05/28/2018 at 10:12:19 AM.    Final    Vas [place] Lower Extremity Arterial Duplex  Result Date: 05/28/2018 LOWER EXTREMITY ARTERIAL DUPLEX STUDY Indications: Peripheral artery disease. High Risk Factors: Hypertension, hyperlipidemia, Diabetes, past history of                    smoking.  Vascular Interventions: Right CIA angioplasty 10/02/2011. Redo left femoral to                         popliteal bypass graft 2012. Current ABI:            Right [place] Left 1.28 Performing Technologist: Alvia Grove RVT  Examination Guidelines: A complete evaluation includes B-mode imaging, spectral Doppler, color Doppler, and power Doppler as needed of all accessible portions of each vessel. Bilateral testing is considered an integral part of a complete examination. Limited examinations for reoccurring indications may be performed as noted.  Left Graft #1: +--------------------+--------+--------+---------+--------+                       PSV cm/s Stenosis Waveform  Comments  +--------------------+--------+--------+---------+--------+  Inflow               82                triphasic           +--------------------+--------+--------+---------+--------+  Proximal Anastomosis 97                triphasic           +--------------------+--------+--------+---------+--------+  Proximal Graft       74                triphasic           +--------------------+--------+--------+---------+--------+  Mid Graft            52                triphasic           +--------------------+--------+--------+---------+--------+  Distal Graft         61                biphasic            +--------------------+--------+--------+---------+--------+  Distal Anastamosis   41                biphasic            +--------------------+--------+--------+---------+--------+  Outflow              48                biphasic            +--------------------+--------+--------+---------+--------+   Summary: Left: Patent left femoral to popliteal bypass graft with no evidence for restenosis.  See table(s) above for measurements and observations. Electronically signed by Ruta Hinds MD on 05/28/2018 at 10:11:38 AM.    Final     Recent Labs: Lab Results  Component Value Date   WBC 4.8 05/20/2018   HGB 12.8 (L) 05/20/2018   PLT 268.0 05/20/2018   NA 141 05/20/2018   K 3.9 05/20/2018   CL 107 05/20/2018   CO2 25 05/20/2018   GLUCOSE  208 (H) 05/20/2018   BUN 14 05/20/2018   CREATININE 0.85 05/20/2018   BILITOT 0.5 12/09/2017   ALKPHOS 72 06/20/2010   AST 16 12/09/2017   ALT 16 12/09/2017   PROT 6.7 12/09/2017   ALBUMIN 4.0 06/20/2010   CALCIUM 8.7 05/20/2018   GFRAA 104 12/09/2017   QFTBGOLDPLUS NEGATIVE 06/06/2017    Speciality Comments: Prior therapy includes: methotrexate ( d/c alcohol use), Arava (d/c patient preference)  Procedures:  No procedures performed Allergies: Patient has no known allergies.   Assessment / Plan:     Visit Diagnoses: No diagnosis found.   Orders: No orders of the defined types were placed in this encounter.  No orders of the defined types were placed in this encounter.   Face-to-face time spent with patient was *** minutes. Greater than 50% of time was spent in counseling and coordination of care.  Follow-Up Instructions: No follow-ups on file.   Ofilia Neas, PA-C  Note - This record has been created using Dragon software.  Chart creation errors have been sought, but may not always  have been located. Such creation errors do not reflect on  the standard of medical care.

## 2018-06-25 NOTE — Progress Notes (Signed)
Subjective: [name] is a 68 y.o. y.o. male who presents for preventative foot care today.  He has h/o diabetes with PAD.   He voices no new pedal concerns on today's visit.  [name], MD is his PCP and last seen one week ago per patient recall.   Current Outpatient Medications:  .  atorvastatin (LIPITOR) 20 MG tablet, Take 20 mg by mouth daily., Disp: , Rfl:  .  budesonide-formoterol (SYMBICORT) 160-4.5 MCG/ACT inhaler, INHALE 2 PUFFS BY MOUTH TWICE A DAY RINSE MOUTH AFTER USE., Disp: 10.2 g, Rfl: 3 .  celecoxib (CELEBREX) 200 MG capsule, Take 1 capsule (200 mg total) by mouth daily. (HOLD X 5 DAYS WHILE TAKING THE IBUPROFEN) (Patient taking differently: Take 200 mg by mouth daily. ), Disp: , Rfl:  .  clopidogrel (PLAVIX) 75 MG tablet, Take 1 tablet (75 mg total) by mouth daily., Disp: 30 tablet, Rfl: 11 .  Dexlansoprazole (DEXILANT) 30 MG capsule, Take 30- 60 min before your first and last meals of the day (Patient taking differently: Take 30 mg by mouth daily. ), Disp: 60 capsule, Rfl: 11 .  docusate sodium (COLACE) 100 MG capsule, Take 100 mg by mouth as needed. , Disp: , Rfl:  .  glipiZIDE (GLUCOTROL) 5 MG tablet, Take by mouth., Disp: , Rfl:  .  HYDROcodone-acetaminophen (NORCO) 7.5-325 MG per tablet, Take 1 tablet by mouth 2 (two) times daily as needed for moderate pain. , Disp: , Rfl:  .  JANUVIA 100 MG tablet, Take 100 mg by mouth daily. , Disp: , Rfl:  .  ketoconazole (NIZORAL) 2 % cream, Apply to both feet once daily for 4 weeks, Disp: 30 g, Rfl: 0 .  losartan (COZAAR) 50 MG tablet, Take 2 each am (Patient taking differently: Take 50 mg by mouth daily. Take 2 each am), Disp: 60 tablet, Rfl: 11 .  metFORMIN (GLUCOPHAGE) 500 MG tablet, Take 500 mg by mouth 2 (two) times daily with a meal. , Disp: , Rfl:  .  nitroGLYCERIN (NITROSTAT) 0.4 MG SL tablet, Place 1 tablet (0.4 mg total) under the tongue every 5 (five) minutes as needed for chest pain., Disp: 90 tablet, Rfl: 3 .  ONE  TOUCH ULTRA TEST test strip, , Disp: , Rfl:  .  PROAIR HFA 108 (90 BASE) MCG/ACT inhaler, Inhale 2 puffs into the lungs every 6 (six) hours as needed for wheezing or shortness of breath. , Disp: , Rfl:  .  Tiotropium Bromide Monohydrate (SPIRIVA RESPIMAT) 2.5 MCG/ACT AERS, Inhale 2 puffs into the lungs daily., Disp: 1 Inhaler, Rfl: 11 .  Trolamine Salicylate (ASPERCREME EX), Apply topically as needed., Disp: , Rfl:  .  vitamin C (ASCORBIC ACID) 500 MG tablet, Take 500 mg by mouth daily., Disp: , Rfl:  .  VOLTAREN 1 % GEL, Apply 1 application topically daily as needed (pain). , Disp: , Rfl:  .  XELJANZ 5 MG TABS, Take 1 tablet by mouth daily., Disp: 90 tablet, Rfl: 0  No Known Allergies  Objective: Vitals:   06/16/18 1028  Temp: 97.7 F (36.5 C)    Vascular Examination: Capillary refill time <3 seconds x 10 digits.  Dorsalis pedis pulses absent left foot and diminished on the right foot.  Posterior tibial pulses absent b/l.  No digital hair x 10 digits.  Skin temperature WNL b/l.  Dermatological Examination: Skin with normal turgor, texture and tone b/l.  Toenails 1-5 b/l discolored, thick, dystrophic with subungual debris and pain with palpation to nailbeds  due to thickness of nails.  Hyperkeratotic lesion plantarmedial hallux left foot.  Musculoskeletal: Muscle strength 5/5 to all LE muscle groups.  Neurological: Sensation intact 5/5 b/l with 10 gram monofilament.  Vibratory sensation intact b/l.  Assessment: 1. Painful onychomycosis toenails 1-5 b/l 2. Callus right hallux 3. NIDDM with PAD  Plan: 1. Discuss diabetic foot care principles. Literature dispensed. 2. Toenails 1-5 b/l were debrided in length and girth without iatrogenic bleeding. 3. Hyperkeratotic lesion(s) right hallux pared with sterile chisel blade and gently smoothed with burr. 4. Patient to continue soft, supportive shoe gear daily. 5. Patient to report any pedal injuries to medical professional  immediately. 6. Follow up 3 months.  7. Patient/POA to call should there be a concern in the interim.

## 2018-06-26 ENCOUNTER — Ambulatory Visit: Payer: Medicare Other | Admitting: Physician Assistant

## 2018-07-02 ENCOUNTER — Ambulatory Visit: Payer: Medicare Other | Admitting: Internal Medicine

## 2018-07-07 ENCOUNTER — Telehealth: Payer: Self-pay | Admitting: Rheumatology

## 2018-07-07 ENCOUNTER — Ambulatory Visit: Payer: Medicare Other | Admitting: Physician Assistant

## 2018-07-07 DIAGNOSIS — Z79899 Other long term (current) drug therapy: Secondary | ICD-10-CM

## 2018-07-07 DIAGNOSIS — E782 Mixed hyperlipidemia: Secondary | ICD-10-CM

## 2018-07-07 DIAGNOSIS — Z9225 Personal history of immunosupression therapy: Secondary | ICD-10-CM

## 2018-07-07 NOTE — Telephone Encounter (Signed)
Lab orders released.  

## 2018-07-07 NOTE — Telephone Encounter (Signed)
Patient called requesting labwork orders be sent to [facility] in .   

## 2018-07-16 ENCOUNTER — Ambulatory Visit: Payer: Medicare Other | Admitting: Internal Medicine

## 2018-07-17 ENCOUNTER — Ambulatory Visit: Payer: Medicare Other | Admitting: Internal Medicine

## 2018-07-23 NOTE — Progress Notes (Signed)
Office Visit Note  Patient: [name]             Date of Birth: Jun 01, 1950           MRN: [id number]             PCP: [name], MD Referring: [name], MD Visit Date: 08/06/2018 Occupation: @GUAROCC @  Subjective:  Pain in the left wrist joint     History of Present Illness: [name] is a 68 y.o. male with history of seronegative rheumatoid arthritis, osteoarthritis, DDD.  Patient is on Xeljanz 5 mg by mouth daily.  He has been off of Xeljanz for 2 weeks due to needing refills.  He is currently having pain and swelling in the left wrist joint.  He is currently wearing a left wrist compression brace.  He has intermittent discomfort in bilateral knee joints, the right hip, right shoulder.  He states that he walks for exercise on a daily basis.   Activities of Daily Living:  Patient reports morning stiffness for 10 minutes.   Patient Reports nocturnal pain.  Difficulty dressing/grooming: Denies Difficulty climbing stairs: Denies Difficulty getting out of chair: Denies Difficulty using hands for taps, buttons, cutlery, and/or writing: Reports  Review of Systems  Constitutional: Positive for fatigue. Negative for night sweats.  HENT: Negative for mouth sores, mouth dryness and nose dryness.   Eyes: Negative for redness, itching and dryness.  Respiratory: Negative for cough, hemoptysis, shortness of breath, wheezing and difficulty breathing.   Cardiovascular: Negative for chest pain, palpitations, hypertension, irregular heartbeat and swelling in legs/feet.  Gastrointestinal: Negative for abdominal pain, constipation and diarrhea.  Endocrine: Negative for increased urination.  Genitourinary: Negative for painful urination and pelvic pain.  Musculoskeletal: Positive for arthralgias, joint pain and morning stiffness. Negative for joint swelling, myalgias, muscle weakness, muscle tenderness and myalgias.  Skin: Negative for color change, rash, hair loss,  nodules/bumps, redness, skin tightness, ulcers and sensitivity to sunlight.  Allergic/Immunologic: Negative for susceptible to infections.  Neurological: Negative for dizziness, fainting, headaches, memory loss, night sweats and weakness.  Hematological: Negative for bruising/bleeding tendency and swollen glands.  Psychiatric/Behavioral: Negative for depressed mood, confusion and sleep disturbance. The patient is not nervous/anxious.     PMFS History:  Patient Active Problem List   Diagnosis Date Noted   Puncture wound of finger, right, complicated, initial encounter 08/05/2018   DOE (dyspnea on exertion) 11/04/2017   Muscle cramps 07/04/2017   Contracture of left elbow 07/04/2017   High risk medication use 06/23/2017   Primary osteoarthritis of both hands 06/23/2017   Primary osteoarthritis of both feet 06/23/2017   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 factor ([place]) 06/23/2017   DDD (degenerative disc disease), cervical 06/23/2017   Rotator cuff syndrome of right shoulder 06/06/2017   History of  bilateral carpal tunnel release 06/06/2017   Ganglion cyst of wrist, left 06/06/2017   Type 2 diabetes mellitus with stage 3 chronic kidney disease, without long-term current use of insulin ([place]) 06/06/2017   Chest pain, musculoskeletal  vs IBS 04/05/2017   COPD with acute exacerbation ([place]) 03/13/2017   Plantar fasciitis 02/21/2017   Iliac artery stenosis, right ([place]) 02/12/2017   Hematochezia 02/10/2017   Abnormal CT of the abdomen 02/10/2017   Cervical spondylosis 10/11/2016   Bony growth 10/13/2015   Pain in joint, lower leg 03/02/2014   Discoloration of skin-Left dorsum foot 09/28/2013   Swelling of limb-Left Calf / Leg 09/28/2013   PVD (peripheral vascular  disease) ([place]) 10/14/2012   Pain in limb- Left popliteal and calf 10/14/2012   Hyperlipidemia    Essential hypertension 03/05/2012   Secondary cardiomyopathy, unspecified  08/12/2011   Generalized osteoarthritis of multiple sites 02/06/2011   History of arterial bypass of lower limb 12/25/2010   Atherosclerosis of native arteries of extremity with intermittent claudication ([place]) 12/25/2010   Coronary atherosclerosis of native coronary artery 04/16/2008   PERIPHERAL VASCULAR DISEASE 04/16/2008   COPD  GOLD II 04/16/2008   GASTROESOPHAGEAL REFLUX DISEASE 04/16/2008   RHEUMATOID ARTHRITIS 04/16/2008   DIABETES MELLITUS, BORDERLINE 04/16/2008    Past Medical History:  Diagnosis Date   Arthritis    RA   Bronchitis    Carotid artery occlusion    Cervical spine disease    Neck surgery in 2007 with Dr. Joya Salm   COPD (chronic obstructive pulmonary disease) ([place])    Coronary atherosclerosis of native coronary artery    Nonobstrtuctive by catheterization 2007 and 2013   DVT (deep venous thrombosis) ([place])    Essential hypertension, benign    GERD (gastroesophageal reflux disease)    Hyperlipidemia    PAD (peripheral artery disease) ([place])    s/p left fem-pop bypass 2007; PTA and stenting of right iliac artery 09/2011   Rheumatoid arthritis(714.0)    Type 2 diabetes mellitus ([place])     Family History  Problem Relation Age of Onset   Heart attack Mother 33   Deep vein thrombosis Mother    Diabetes Mother    Hyperlipidemia Mother    Hypertension Mother    Peripheral vascular disease Mother    Stroke Father    Diabetes Father    Hyperlipidemia Father    Hypertension Father    Peripheral vascular disease Father    Diabetes Sister    Hyperlipidemia Sister    Hypertension Sister    Diabetes Brother    Hyperlipidemia Brother    Cancer Brother    Deep vein thrombosis Brother    Hypertension Brother    Peripheral vascular disease Brother    Hyperlipidemia Son    Cancer Son    Hypertension Son    Colon cancer Neg Hx    Past Surgical History:  Procedure Laterality Date   ABDOMINAL AORTAGRAM N/A 10/03/2011     Procedure: ABDOMINAL Maxcine Ham;  Surgeon: Conrad Grantsville, MD;  Location: [facility] CATH LAB;  Service: Cardiovascular;  Laterality: N/A;   ABDOMINAL AORTOGRAM W/LOWER EXTREMITY N/A 01/16/2017   Procedure: ABDOMINAL AORTOGRAM W/LOWER EXTREMITY;  Surgeon: Conrad Lake McMurray, MD;  Location: [facility] CV LAB;  Service: Cardiovascular;  Laterality: N/A;   CERVICAL DISC SURGERY     COLONOSCOPY WITH PROPOFOL N/A 02/17/2017   Procedure: COLONOSCOPY WITH PROPOFOL;  Surgeon: Daneil Dolin, MD;  Location: [facility];  Service: Endoscopy;  Laterality: N/A;  2:45pm   FEMORAL-POPLITEAL BYPASS GRAFT  11/06/2005   ILIAC ARTERY STENT  10-03-11   Right CIA stenting   PERIPHERAL VASCULAR BALLOON ANGIOPLASTY Right 01/16/2017   Procedure: PERIPHERAL VASCULAR BALLOON ANGIOPLASTY;  Surgeon: Conrad Burney, MD;  Location: [facility] CV LAB;  Service: Cardiovascular;  Laterality: Right;  common iliac    PR VEIN BYPASS GRAFT,AORTO-FEM-POP  06-22-2010   Redo Left Fem-Pop   Repair of left arm fracture     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    Immunization History  Administered Date(s)  Administered   Influenza, High Dose Seasonal PF 10/14/2016   Influenza-Unspecified 11/08/2008   PPD Test 09/22/2007   Tdap 08/05/2018     Objective: Vital Signs: BP 139/87 (BP Location: Left Arm, Patient Position: Sitting, Cuff Size: Normal)    Pulse 80    Resp 14    Ht 5\' 10"  (1.778 m)    Wt 171 lb (77.6 kg)    BMI 24.54 kg/m    Physical Exam Vitals signs and nursing note reviewed.  Constitutional:      Appearance: He is well-developed.  HENT:     Head: Normocephalic and atraumatic.  Eyes:     Conjunctiva/sclera: Conjunctivae normal.     Pupils: Pupils are equal, round, and reactive to light.  Neck:     Musculoskeletal: Normal range of motion and neck supple.  Cardiovascular:     Rate and Rhythm: Normal rate  and regular rhythm.     Heart sounds: Normal heart sounds.  Pulmonary:     Effort: Pulmonary effort is normal.     Breath sounds: Normal breath sounds.  Abdominal:     General: Bowel sounds are normal.     Palpations: Abdomen is soft.  Skin:    General: Skin is warm and dry.     Capillary Refill: Capillary refill takes less than 2 seconds.  Neurological:     Mental Status: He is alert and oriented to person, place, and time.  Psychiatric:        Behavior: Behavior normal.      Musculoskeletal Exam: C-spine, thoracic spine, and lumbar spine good ROM.  No midline spinal tenderness.  Right shoulder has good ROM with some discomfort.  Left shoulder has good ROM with no discomfort. Elbow joint contractures bilaterally, left worse than right.  Limited ROM of both wrist joints.  Tenderness and mild inflammation on the dorsal aspect of the left wrist joint.  Synovial thickening of MCP joints.  Contracture of several PIP joints. 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  Hip joints, knee joints, ankle joints, MTPs, PIPs ,and DIPs good ROM with no synovitis.  No warmth or effusion of knee joints.  No tenderness or swelling of ankle joints.  CDAI Exam: CDAI Score: 1.2  Patient Global: 6 mm; Provider Global: 6 mm Swollen: 0 ; Tender: 0  Joint Exam   No joint exam has been documented for this visit   There is currently no information documented on the homunculus. Go to the Rheumatology activity and complete the homunculus joint exam.  Investigation: No additional findings.  Imaging: No results found.  Recent Labs: Lab Results  Component Value Date   WBC 4.8 05/20/2018   HGB 12.8 (L) 05/20/2018   PLT 268.0 05/20/2018   NA 141 05/20/2018   K 3.9 05/20/2018   CL 107 05/20/2018   CO2 25 05/20/2018   GLUCOSE 208 (H) 05/20/2018   BUN 14 05/20/2018   CREATININE 0.85 05/20/2018   BILITOT 0.5 12/09/2017   ALKPHOS 72 06/20/2010   AST 16 12/09/2017   ALT 16 12/09/2017   PROT 6.7 12/09/2017    ALBUMIN 4.0 06/20/2010   CALCIUM 8.7 05/20/2018   GFRAA 104 12/09/2017   QFTBGOLDPLUS NEGATIVE 06/06/2017    Speciality Comments: Prior therapy includes: methotrexate ( d/c alcohol use), Arava (d/c patient preference)  Procedures:  No procedures performed Allergies: Patient has no known allergies.   Assessment / Plan:     Visit Diagnoses: 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 factor ([place]) - He  has tenderness and inflammation on the dorsal aspect of the left wrist joint.  He is currently wearing a left wrist compression sleeve which is been helping with the stiffness and joint swelling.  He continues to have intermittent pain in the right shoulder, left wrist, bilateral knee joints.  No warmth or effusion of the knee joints were noted on exam today.  He takes Xeljanz 5 mg 1 tablet by mouth daily.  He does not want to make any changes to his medication regimen at this time.  He does need to update lab work and will require a refill of Morrie Sheldon.  He has been off of Xeljanz for 2 weeks due to requiring a refill.  He is aware that he will need for lab work every 3 months.  He was advised to notify us if he develops increased joint pain or joint swelling.  He will follow-up in the office in 5 months.  High risk medication use - Xeljanz 5 mg 1 tablet by mouth daily.  Methotrexate discontinued due to alcohol use, Orencia declined by patient, treated with Morrie Sheldon at [facility]Last TB gold negative on 06/06/2017.  Due for TB gold today and will monitor yearly.  Last lipid panel showed elevated LDL at 104 on 06/06/2017.  Due for lipid panel today and will monitor yearly.  He will return for lab work in October and every 3 months to monitor for drug toxicity.- Plan: Lipid panel, QuantiFERON-TB Gold Plus, CBC with Differential/Platelet, COMPLETE METABOLIC PANEL WITH GFR  Contracture of left elbow - Chronic.  S/p previous injury/surgery.   Primary osteoarthritis of both  hands - He has PIP and DIP synovial thickening consistent with osteoarthritis of both hands.  He has no tenderness or synovitis on exam.  Joint protection and muscle strengthening were discussed.   Primary osteoarthritis of both feet - He has PIP and DIP synovial thickening consistent with osteoarthritis of both hands. He has bunions bilaterally.  He wears proper fitting shoes.   DDD (degenerative disc disease), cervical - He has limited ROM with lateral rotation.  He has no discomfort at this time.  He has no symptoms of radiculopathy at this time.   Rotator cuff syndrome of right shoulder - He has intermittent discomfort in the right shoulder joint.  He has slightly limited ROM with internal rotation and abduction.    Other medical conditions are listed as follows:   Muscle cramps  Mixed hyperlipidemia   History of  bilateral carpal tunnel release  Ganglion cyst of wrist, left   Type 2 diabetes mellitus with stage 3 chronic kidney disease, without long-term current use of insulin (HCC)   PVD (peripheral vascular disease) ([place])   Essential hypertension   COPD  GOLD II   Atherosclerosis of native coronary artery of native heart without angina pectoris  Orders: Orders Placed This Encounter  Procedures   Lipid panel   QuantiFERON-TB Gold Plus   CBC with Differential/Platelet   COMPLETE METABOLIC PANEL WITH GFR   No orders of the defined types were placed in this encounter.     Follow-Up Instructions: Return in about 5 months (around 01/06/2019) for Rheumatoid arthritis, Osteoarthritis, DDD.   Ofilia Neas, PA-C  Note - This record has been created using Dragon software.  Chart creation errors have been sought, but may not always  have been located. Such creation errors do not reflect on  the standard of medical care.

## 2018-07-27 ENCOUNTER — Telehealth: Payer: Self-pay | Admitting: Rheumatology

## 2018-07-27 DIAGNOSIS — Z79899 Other long term (current) drug therapy: Secondary | ICD-10-CM

## 2018-07-27 NOTE — Telephone Encounter (Signed)
Patient called requesting labwork orders be sent to [facility] in [place].  Patient states he will be going on Thursday, 07/30/18.  Patient states he called last month to have labwork orders released to [facility], but never went and was told he new orders would be required.

## 2018-07-27 NOTE — Telephone Encounter (Signed)
Attempted to contact patient and left message on machine to advise patient that lab orders have been released.

## 2018-07-30 ENCOUNTER — Telehealth: Payer: Self-pay | Admitting: Rheumatology

## 2018-07-30 DIAGNOSIS — Z79899 Other long term (current) drug therapy: Secondary | ICD-10-CM

## 2018-07-30 DIAGNOSIS — Z9225 Personal history of immunosupression therapy: Secondary | ICD-10-CM

## 2018-07-30 NOTE — Telephone Encounter (Signed)
Lab orders released.  

## 2018-07-30 NOTE — Telephone Encounter (Signed)
Patient going to [facility] in [place]. Please release orders.

## 2018-08-04 ENCOUNTER — Telehealth: Payer: Self-pay | Admitting: Rheumatology

## 2018-08-04 NOTE — Telephone Encounter (Signed)
Attempted to contact the patient and left message for patient to call the office.  

## 2018-08-04 NOTE — Telephone Encounter (Signed)
FYI: Patient called to let you know he was unable to get labs drawn last week. He went over to lab several times, but per patient labs were not sent over for correct lab. Patient has return office visit this Thursday, and will have labs drawn then.

## 2018-08-05 ENCOUNTER — Ambulatory Visit (INDEPENDENT_AMBULATORY_CARE_PROVIDER_SITE_OTHER): Payer: Medicare Other | Admitting: Internal Medicine

## 2018-08-05 ENCOUNTER — Encounter: Payer: Self-pay | Admitting: Internal Medicine

## 2018-08-05 ENCOUNTER — Other Ambulatory Visit: Payer: Self-pay

## 2018-08-05 VITALS — BP 134/70 | HR 74 | Temp 98.1°F | Ht 70.0 in | Wt 172.0 lb

## 2018-08-05 DIAGNOSIS — S61239A Puncture wound without foreign body of unspecified finger without damage to nail, initial encounter: Secondary | ICD-10-CM | POA: Insufficient documentation

## 2018-08-05 DIAGNOSIS — Z23 Encounter for immunization: Secondary | ICD-10-CM

## 2018-08-05 DIAGNOSIS — J449 Chronic obstructive pulmonary disease, unspecified: Secondary | ICD-10-CM

## 2018-08-05 NOTE — Progress Notes (Signed)
Subjective:    Patient ID: [name], male   DOB: 1950-09-23    MRN: [id number]    Brief patient profile:  89 yobm quit smoking 2013 with dx of copd GOLD II with reversibility on prn inhalers didn't really use it much and "worse over the years" and placed on BREO but worse fall  2018 so placed on Trelegy and referred to pulmonary clinic 01/28/2017 by Dr   [name]      History of Present Illness  01/28/2017 1st [facility] office visit/ [name]  ? Copd   Chief Complaint  Patient presents with  . Pulmonary Consult    Referred by Dr. [name] for eval of COPD. Pt states that he has been having SOB for the past several yrs, worse for the past 3-6 months. He gets SOB with walking short distances and up and down stairs. He has CP that comes and goes. He also has some cough with white sputum.  He is using proair once daily on average.   doe x years much worse x sev months, cp is not related to breathing or exertion localized L ant chest/ lasts just a few secs Cough is worse since sob worse/ esp hs and early in am Baseline = 15 min walk outside mostly flat but struggles if gets in a hurry or steps  Thinks proair helps his cp but note resolves in 2 secs whether uses proair or not rec Continue the dexilant but for now take it Take 30- 60 min before your first and last meals of the day until cough and breathing better then just Take 30-60 min before first meal of the day  Stop Trelegy and lisinopril Losartan 50 mg only daily takes the place of lisinopril  Plan A = Automatic = symbicort 160 Take 2 puffs first thing in am and then another 2 puffs about 12 hours later.  Work on inhaler technique:   Plan B = Backup Only use your albuterol as a rescue medication        03/11/2017  f/u ov/[name] re: copd II/ brought meds  Chief Complaint  Patient presents with  . Follow-up    zpack helped with his cough. He occ produces some yellow sputum. He has not had to use his albuterol inhaler. He has some CP on  the left that comes and goes.   Dyspnea:  MMRC3 = can't walk 100 yards even at a slow pace at a flat grade s stopping due to sob   Cough: better p rx zpak Sleep: ok  SABA use:  No need for saba L cp  X years comes and goes / better p zpak "it was my nodules" - fleeting and no change with breathing or ex rec Keep your appointment for March 29 but call sooner if condition worsens  Change your cozar to where you take 50 mg x one pill twice daily for a total of 100mg  daily    04/04/2017  f/u ov/[name] re:  Copd GOLD II with marked reversibility on symb 160 2bid  Chief Complaint  Patient presents with  . Follow-up    Feels good but has chest pain that comes and goes.  Dyspnea:  MMRC2 = can't walk a nl pace on a flat grade s sob but does fine slow and flat eg Cough: no cough Sleep: most noct fine  SABA use:  Never  Cp same location as previous parasternal L upper lasts x no more than one-two secs 2-3 times a day  returned Jan 2019 vs many years prior to [place] same pattern attributed to "lung nodules'  rec Plan A = Automatic = Symbicort 160 Take 2 puffs first thing in am and then another 2 puffs about 12 hours later.  Work on inhaler technique:   Plan B = Backup Only use your albuterol as a rescue medication      11/04/2017  Acute extended ov/[name] re: cp/ sob  GOLD II copd  Chief Complaint  Patient presents with  . Acute Visit    Increased SOB and chest discomfort off and on x 2 wks. He is using his proair 1-2 x per wk.   Dyspnea:  Across parking lot x months Cough: no  Sleeping: better pain 2 pillows  SABA use: helps some  02: no   L Cp x years  comes and goes over seconds, better supine and always gone when wakes up in am rec Work on inhaler technique:  relax and gently blow all the way out then take a nice smooth deep breath back in, triggering the inhaler at same time you start breathing in.  Hold for up to 5 seconds if you can. Blow out thru nose. Rinse and gargle with water when  done Plan A = Automatic = symbicort 160 Take 2 puffs first thing in am and then another 2 puffs about 12 hours later.  Plan B = Backup Only use your albuterol as a rescue medication   Classic   pain pattern suggests ibs:      Please schedule a follow up office visit in 4 weeks, sooner if needed  with all medications /inhalers/ solutions in hand so we can verify exactly what you are taking. This includes all medications from all doctors and over the counters - PFTs on return  - note did not go to lab as req    04/08/18 pc Called and spoke with pt who stated he has had a cough with gray mucus x2-3 days. Pt stated he had a cold with clear postnasal drainage. Pt denies any fever. Pt has SOB which he said is due to the COPD and also states he has some tightness in chest and pain on left side. rec Prednisone 10 mg take  4 each am x 2 days,   2 each am x 2 days,  1 each am x 2 days and stop  zpak      05/20/2018 acute extended  ov/[name] re: copd/ recurrent cp x one month/ did not bring meds as requested   Chief Complaint  Patient presents with  . Acute Visit    Pt c/o left side CP and increased SOB x 1 month. He has had muscle aches for the past 6 wks.   Dyspnea:  Walks around park x 15 min daily never tries inhaler before walks, sob stops him and legs hurt but no cp with exertion  Cough: no Sleeping: on side bed is flat two pillows SABA use: bid avg  02: none  Cp comes and goes x years 2-3 min, always in same place / never supine/ did not follow IBS recs rec Plan A = Automatic = symbicort 160 Take 2 puffs first thing in am and then another 2 puffs about 12 hours later.  Add spiriva 2 pffs each am only  Plan B = Backup Only use your albuterol as a rescue medication  Classic   pain pattern suggests ibs rx citrucel/diet    Please schedule a follow up office visit in  6 weeks, sooner if needed  with all medications /inhalers/ solutions in hand so we can verify exactly what you are taking. This  includes all medications from all doctors and over the counters   08/05/2018  f/u ov/[name] re: copd / fish  hook 07/31/18 to L index finger symb/spiriva Chief Complaint  Patient presents with  . Follow-up    Breathing is overall doing well. He states he has not had to use his proair.   Dyspnea:  MMRC1 = can walk nl pace, flat grade, can't hurry or go uphills or steps s sob   Cough: no Sleeping: able to lie flat SABA use: none on hand but rarely uses  02: no   No obvious day to day or daytime variability or assoc excess/ purulent sputum or mucus plugs or hemoptysis or cp or chest tightness, subjective wheeze or overt sinus or hb symptoms.   Sleeping as above  without nocturnal  or early am exacerbation  of respiratory  c/o's or need for noct saba. Also denies any obvious fluctuation of symptoms with weather or environmental changes or other aggravating or alleviating factors except as outlined above   No unusual exposure hx or h/o childhood pna/ asthma or knowledge of premature birth.  Current Allergies, Complete Past Medical History, Past Surgical History, Family History, and Social History were reviewed in [employer] record.  ROS  The following are not active complaints unless bolded Hoarseness, sore throat, dysphagia, dental problems, itching, sneezing,  nasal congestion or discharge of excess mucus or purulent secretions, ear ache,   fever, chills, sweats, unintended wt loss or wt gain, classically pleuritic or exertional cp,  orthopnea pnd or arm/hand swelling  or leg swelling, presyncope, palpitations, abdominal pain, anorexia, nausea, vomiting, diarrhea  or change in bowel habits or change in bladder habits, change in stools or change in urine, dysuria, hematuria,  rash, arthralgias, visual complaints, headache, numbness, weakness or ataxia or problems with walking or coordination,  change in mood or  memory.        Current Meds  Medication Sig  . atorvastatin  (LIPITOR) 20 MG tablet Take 20 mg by mouth daily.  . budesonide-formoterol (SYMBICORT) 160-4.5 MCG/ACT inhaler INHALE 2 PUFFS BY MOUTH TWICE A DAY RINSE MOUTH AFTER USE.  . celecoxib (CELEBREX) 200 MG capsule Take 1 capsule (200 mg total) by mouth daily. (HOLD X 5 DAYS WHILE TAKING THE IBUPROFEN) (Patient taking differently: Take 200 mg by mouth daily. )  . clopidogrel (PLAVIX) 75 MG tablet Take 1 tablet (75 mg total) by mouth daily.  Marland Kitchen Dexlansoprazole (DEXILANT) 30 MG capsule Take 30- 60 min before your first and last meals of the day (Patient taking differently: Take 30 mg by mouth daily. )  . docusate sodium (COLACE) 100 MG capsule Take 100 mg by mouth as needed.   Marland Kitchen glipiZIDE (GLUCOTROL) 5 MG tablet Take by mouth.  Marland Kitchen HYDROcodone-acetaminophen (NORCO) 7.5-325 MG per tablet Take 1 tablet by mouth 2 (two) times daily as needed for moderate pain.   Marland Kitchen JANUVIA 100 MG tablet Take 100 mg by mouth daily.   Marland Kitchen ketoconazole (NIZORAL) 2 % cream Apply to both feet once daily for 4 weeks  . losartan (COZAAR) 50 MG tablet Take 2 each am (Patient taking differently: Take 50 mg by mouth daily. Take 2 each am)  . metFORMIN (GLUCOPHAGE) 500 MG tablet Take 500 mg by mouth 2 (two) times daily with a meal.   . nitroGLYCERIN (NITROSTAT) 0.4 MG SL  tablet Place 1 tablet (0.4 mg total) under the tongue every 5 (five) minutes as needed for chest pain.  . ONE TOUCH ULTRA TEST test strip   . PROAIR HFA 108 (90 BASE) MCG/ACT inhaler Inhale 2 puffs into the lungs every 6 (six) hours as needed for wheezing or shortness of breath.   . Tiotropium Bromide Monohydrate (SPIRIVA RESPIMAT) 2.5 MCG/ACT AERS Inhale 2 puffs into the lungs daily.  Loura Pardon Salicylate (ASPERCREME EX) Apply topically as needed.  . vitamin C (ASCORBIC ACID) 500 MG tablet Take 500 mg by mouth daily.  . VOLTAREN 1 % GEL Apply 1 application topically daily as needed (pain).   Morrie Sheldon 5 MG TABS Take 1 tablet by mouth daily.                    Objective:   Physical Exam    amb anxious bm nad   08/05/2018       172 05/20/2018       176 11/04/2017     180  04/04/2017       179  03/11/2017         180   02/13/2017        175   01/28/17 175 lb 12.8 oz (79.7 kg)  01/16/17 175 lb (79.4 kg)  11/01/16 170 lb (77.1 kg)     Vital signs reviewed - Note on arrival 02 sats  97% on RA       HEENT: nl dentition / oropharynx. Nl external ear canals without cough reflex -  Mild bilateral non-specific turbinate edema     NECK :  without JVD/Nodes/TM/ nl carotid upstrokes bilaterally   LUNGS: no acc muscle use,  Mod barrel  contour chest wall with bilateral  Distant bs s audible wheeze and  without cough on insp or exp maneuver and mod  Hyperresonant  to  percussion bilaterally     CV:  RRR  no s3 or murmur or increase in P2, and no edema   ABD:  soft and nontender with pos mid insp Hoover's  in the supine position. No bruits or organomegaly appreciated, bowel sounds nl  MS:     ext warm without deformities, calf tenderness, cyanosis or clubbing No obvious joint restrictions   SKIN: warm and dry with puncture wound L index finger s erythema, drainage     NEURO:  alert, approp, nl sensorium with  no motor or cerebellar deficits apparent.              Assessment:

## 2018-08-05 NOTE — Patient Instructions (Signed)
Tdap today  Soak the finger in warm soapy or salty water up to 15 min x 4 x daily if needed - call your PCP or go to urgent care if getting worse  Please schedule a follow up visit in 6 months but call sooner if needed

## 2018-08-06 ENCOUNTER — Ambulatory Visit (INDEPENDENT_AMBULATORY_CARE_PROVIDER_SITE_OTHER): Payer: Medicare Other | Admitting: Physician Assistant

## 2018-08-06 ENCOUNTER — Encounter: Payer: Self-pay | Admitting: Physician Assistant

## 2018-08-06 VITALS — BP 139/87 | HR 80 | Resp 14 | Ht 70.0 in | Wt 171.0 lb

## 2018-08-06 DIAGNOSIS — M75101 Unspecified rotator cuff tear or rupture of right shoulder, not specified as traumatic: Secondary | ICD-10-CM

## 2018-08-06 DIAGNOSIS — I739 Peripheral vascular disease, unspecified: Secondary | ICD-10-CM

## 2018-08-06 DIAGNOSIS — M19072 Primary osteoarthritis, left ankle and foot: Secondary | ICD-10-CM

## 2018-08-06 DIAGNOSIS — M24522 Contracture, left elbow: Secondary | ICD-10-CM | POA: Diagnosis not present

## 2018-08-06 DIAGNOSIS — M5030 Other cervical disc degeneration, unspecified cervical region: Secondary | ICD-10-CM

## 2018-08-06 DIAGNOSIS — I10 Essential (primary) hypertension: Secondary | ICD-10-CM

## 2018-08-06 DIAGNOSIS — N183 Type 2 diabetes mellitus with diabetic chronic kidney disease: Secondary | ICD-10-CM

## 2018-08-06 DIAGNOSIS — M19041 Primary osteoarthritis, right hand: Secondary | ICD-10-CM | POA: Diagnosis not present

## 2018-08-06 DIAGNOSIS — I2510 Atherosclerotic heart disease of native coronary artery without angina pectoris: Secondary | ICD-10-CM

## 2018-08-06 DIAGNOSIS — M67432 Ganglion, left wrist: Secondary | ICD-10-CM

## 2018-08-06 DIAGNOSIS — J449 Chronic obstructive pulmonary disease, unspecified: Secondary | ICD-10-CM

## 2018-08-06 DIAGNOSIS — Z79899 Other long term (current) drug therapy: Secondary | ICD-10-CM

## 2018-08-06 DIAGNOSIS — Z9889 Other specified postprocedural states: Secondary | ICD-10-CM

## 2018-08-06 DIAGNOSIS — E782 Mixed hyperlipidemia: Secondary | ICD-10-CM

## 2018-08-06 DIAGNOSIS — M19042 Primary osteoarthritis, left hand: Secondary | ICD-10-CM

## 2018-08-06 DIAGNOSIS — M0609 Rheumatoid arthritis without rheumatoid factor, multiple sites: Secondary | ICD-10-CM | POA: Diagnosis not present

## 2018-08-06 DIAGNOSIS — R252 Cramp and spasm: Secondary | ICD-10-CM

## 2018-08-06 DIAGNOSIS — E1122 Type 2 diabetes mellitus with diabetic chronic kidney disease: Secondary | ICD-10-CM

## 2018-08-06 DIAGNOSIS — M19071 Primary osteoarthritis, right ankle and foot: Secondary | ICD-10-CM

## 2018-08-07 NOTE — Progress Notes (Signed)
Glucose is elevated-150.  Rest of CMP WNL. CBC WNL.  Lipid panel WNL

## 2018-08-08 ENCOUNTER — Encounter: Payer: Self-pay | Admitting: Internal Medicine

## 2018-08-08 DIAGNOSIS — S61239A Puncture wound without foreign body of unspecified finger without damage to nail, initial encounter: Secondary | ICD-10-CM | POA: Insufficient documentation

## 2018-08-08 LAB — CBC WITH DIFFERENTIAL/PLATELET
Absolute Monocytes: 348 cells/uL (ref 200–950)
Basophils Absolute: 42 cells/uL (ref 0–200)
Basophils Relative: 0.7 %
Eosinophils Absolute: 60 cells/uL (ref 15–500)
Eosinophils Relative: 1 %
HCT: 40.4 % (ref 38.5–50.0)
Hemoglobin: 13.5 g/dL (ref 13.2–17.1)
Lymphs Abs: 1746 cells/uL (ref 850–3900)
MCH: 29.8 pg (ref 27.0–33.0)
MCHC: 33.4 g/dL (ref 32.0–36.0)
MCV: 89.2 fL (ref 80.0–100.0)
MPV: 10.3 fL (ref 7.5–12.5)
Monocytes Relative: 5.8 %
Neutro Abs: 3804 cells/uL (ref 1500–7800)
Neutrophils Relative %: 63.4 %
Platelets: 280 10*3/uL (ref 140–400)
RBC: 4.53 10*6/uL (ref 4.20–5.80)
RDW: 14.3 % (ref 11.0–15.0)
Total Lymphocyte: 29.1 %
WBC: 6 10*3/uL (ref 3.8–10.8)

## 2018-08-08 LAB — LIPID PANEL
Cholesterol: 169 mg/dL (ref <200)
HDL: 63 mg/dL (ref >=40)
LDL Cholesterol (Calc): 88 mg/dL (calc)
Non-HDL Cholesterol (Calc): 106 mg/dL (calc) (ref <130)
Total CHOL/HDL Ratio: 2.7 (calc) (ref <5.0)
Triglycerides: 88 mg/dL (ref <150)

## 2018-08-08 LAB — COMPLETE METABOLIC PANEL WITH GFR
AG Ratio: 1.7 (calc) (ref 1.0–2.5)
ALT: 18 U/L (ref 9–46)
AST: 17 U/L (ref 10–35)
Albumin: 4.6 g/dL (ref 3.6–5.1)
Alkaline phosphatase (APISO): 65 U/L (ref 35–144)
BUN: 14 mg/dL (ref 7–25)
CO2: 30 mmol/L (ref 20–32)
Calcium: 9.9 mg/dL (ref 8.6–10.3)
Chloride: 108 mmol/L (ref 98–110)
Creat: 0.98 mg/dL (ref 0.70–1.25)
GFR, Est African American: 92 mL/min/{1.73_m2} (ref >=60)
GFR, Est Non African American: 79 mL/min/{1.73_m2} (ref >=60)
Globulin: 2.7 g/dL (calc) (ref 1.9–3.7)
Glucose, Bld: 150 mg/dL — ABNORMAL HIGH (ref 65–99)
Potassium: 4.6 mmol/L (ref 3.5–5.3)
Sodium: 142 mmol/L (ref 135–146)
Total Bilirubin: 0.5 mg/dL (ref 0.2–1.2)
Total Protein: 7.3 g/dL (ref 6.1–8.1)

## 2018-08-08 LAB — QUANTIFERON-TB GOLD PLUS
Mitogen-NIL: >10 IU/mL
NIL: 0.02 IU/mL
QuantiFERON-TB Gold Plus: NEGATIVE
TB1-NIL: 0 IU/mL
TB2-NIL: 0 IU/mL

## 2018-08-08 NOTE — Assessment & Plan Note (Signed)
Quit smoking 2013 PFT's 01/04/15  FEV1 2.15 (71 % ) ratio 58  p 14 % improvement from saba p ? prior to study with DLCO  70 % corrects to 94  % for alv volume  With only mild curvature f/v loop  - Spirometry 01/28/2017  FEV1 1.59 (53%)  Ratio 59 off all rx  - 01/28/2017    symb 160 2bid and d/c acei and double the dexilant > marked improvement 02/13/2017  PFT's  04/04/2017  FEV1 1.99 (68 % ) ratio 62  p 23 % improvement from saba p nothing prior to study with DLCO  63 % corrects to 82  % for alv volume   - 04/04/2017  After extensive coaching inhaler device  effectiveness =    90%  - 11/04/2017  Walked RA x 3 laps @ 185 ft each stopped due to  End of study, fast pace, no  Desat, mild sob  - 11/04/2017  restarted symb 160 2bid - Spirometry 11/04/2017  FEV1 1.4 (47%)  Ratio 58 off all rx     - 05/20/2018  After extensive coaching inhaler device,  effectiveness = 75% with symb and 90% with SMI > try add spiriva 2 pffs each am   - 05/20/2018   Walked RA  2 laps @  approx 29ft each @ fast pace  stopped due to  End of study, no cp or sob or desats    - 08/05/2018  After extensive coaching inhaler device,  effectiveness =    75% (short Ti)     Group D in terms of symptom/risk and laba/lama/ICS  therefore appropriate rx at this point >>>  Continue symb/ spiriva   F/u q 6 m   I had an extended discussion with the patient reviewing all relevant studies completed to date and  lasting 15 to 20 minutes of a 25 minute visit    I performed detailed device teaching using a teach back method which extended face to face time for this visit (see above)  Each maintenance medication was reviewed in detail including emphasizing most importantly the difference between maintenance and prns and under what circumstances the prns are to be triggered using an action plan format that is not reflected in the computer generated alphabetically organized AVS which I have not found useful in most complex patients, especially with  respiratory illnesses  Please see AVS for specific instructions unique to this visit that I personally wrote and verbalized to the the pt in detail and then reviewed with pt  by my nurse highlighting any  changes in therapy recommended at today's visit to their plan of care.

## 2018-08-08 NOTE — Assessment & Plan Note (Signed)
Fish hook 07/31/2018  Looks to be healing on it's own s infection   rec Tdap/ warm soapy soaks prn

## 2018-08-10 NOTE — Progress Notes (Signed)
TB gold negative

## 2018-09-18 ENCOUNTER — Other Ambulatory Visit: Payer: Self-pay | Admitting: Neurological Surgery

## 2018-09-18 ENCOUNTER — Telehealth: Payer: Self-pay | Admitting: Rheumatology

## 2018-09-18 DIAGNOSIS — M542 Cervicalgia: Secondary | ICD-10-CM

## 2018-09-18 MED ORDER — XELJANZ 5 MG PO TABS: ORAL_TABLET | ORAL | 0 refills | Status: DC

## 2018-09-18 NOTE — Telephone Encounter (Signed)
Last visit: 08/06/18 Next Visit: 01/18/19 Labs: 08/06/18 glucose is elevated-150. Rest of CMP WNL. CBC WNL. Lipid panel WNL   Okay to refill per Dr. Estanislado Pandy

## 2018-09-18 NOTE — Telephone Encounter (Signed)
Patient called requesting prescription refill of Morrie Sheldon to be sent to [facility] at [place] in [place].  Patient states he is out of medication.

## 2018-09-22 ENCOUNTER — Telehealth: Payer: Self-pay | Admitting: Rheumatology

## 2018-09-22 NOTE — Telephone Encounter (Addendum)
Attempted to contact the patient and left message for patient to call the office.  

## 2018-09-22 NOTE — Telephone Encounter (Signed)
Patient left a voicemail stating he was returning your call. °

## 2018-09-22 NOTE — Telephone Encounter (Signed)
Patient left a voicemail requesting a return call regarding his prescription refill of Xeljanz.

## 2018-09-23 ENCOUNTER — Ambulatory Visit (INDEPENDENT_AMBULATORY_CARE_PROVIDER_SITE_OTHER): Payer: Medicare Other | Admitting: Podiatry

## 2018-09-23 ENCOUNTER — Other Ambulatory Visit: Payer: Self-pay

## 2018-09-23 ENCOUNTER — Encounter: Payer: Self-pay | Admitting: Podiatry

## 2018-09-23 DIAGNOSIS — L84 Corns and callosities: Secondary | ICD-10-CM

## 2018-09-23 DIAGNOSIS — M79674 Pain in right toe(s): Secondary | ICD-10-CM

## 2018-09-23 DIAGNOSIS — M79675 Pain in left toe(s): Secondary | ICD-10-CM | POA: Diagnosis not present

## 2018-09-23 DIAGNOSIS — E1159 Type 2 diabetes mellitus with other circulatory complications: Secondary | ICD-10-CM

## 2018-09-23 DIAGNOSIS — B351 Tinea unguium: Secondary | ICD-10-CM

## 2018-09-23 NOTE — Telephone Encounter (Signed)
Patient called the office stating he needs a refill on his Morrie Sheldon. Patient advised his prescription was sent to the pharmacy on 09/18/18. Patient will contact the pharmacy.

## 2018-09-23 NOTE — Patient Instructions (Signed)
Diabetes Mellitus and Foot Care Foot care is an important part of your health, especially when you have diabetes. Diabetes may cause you to have problems because of poor blood flow (circulation) to your feet and legs, which can cause your skin to:  Become thinner and drier.  Break more easily.  Heal more slowly.  Peel and crack. You may also have nerve damage (neuropathy) in your legs and feet, causing decreased feeling in them. This means that you may not notice minor injuries to your feet that could lead to more serious problems. Noticing and addressing any potential problems early is the best way to prevent future foot problems. How to care for your feet Foot hygiene  Wash your feet daily with warm water and mild soap. Do not use hot water. Then, pat your feet and the areas between your toes until they are completely dry. Do not soak your feet as this can dry your skin.  Trim your toenails straight across. Do not dig under them or around the cuticle. File the edges of your nails with an emery board or nail file.  Apply a moisturizing lotion or petroleum jelly to the skin on your feet and to dry, brittle toenails. Use lotion that does not contain alcohol and is unscented. Do not apply lotion between your toes. Shoes and socks  Wear clean socks or stockings every day. Make sure they are not too tight. Do not wear knee-high stockings since they may decrease blood flow to your legs.  Wear shoes that fit properly and have enough cushioning. Always look in your shoes before you put them on to be sure there are no objects inside.  To break in new shoes, wear them for just a few hours a day. This prevents injuries on your feet. Wounds, scrapes, corns, and calluses  Check your feet daily for blisters, cuts, bruises, sores, and redness. If you cannot see the bottom of your feet, use a mirror or ask someone for help.  Do not cut corns or calluses or try to remove them with medicine.  If you  find a minor scrape, cut, or break in the skin on your feet, keep it and the skin around it clean and dry. You may clean these areas with mild soap and water. Do not clean the area with peroxide, alcohol, or iodine.  If you have a wound, scrape, corn, or callus on your foot, look at it several times a day to make sure it is healing and not infected. Check for: ? Redness, swelling, or pain. ? Fluid or blood. ? Warmth. ? Pus or a bad smell. General instructions  Do not cross your legs. This may decrease blood flow to your feet.  Do not use heating pads or hot water bottles on your feet. They may burn your skin. If you have lost feeling in your feet or legs, you may not know this is happening until it is too late.  Protect your feet from hot and cold by wearing shoes, such as at the beach or on hot pavement.  Schedule a complete foot exam at least once a year (annually) or more often if you have foot problems. If you have foot problems, report any cuts, sores, or bruises to your health care provider immediately. Contact a health care provider if:  You have a medical condition that increases your risk of infection and you have any cuts, sores, or bruises on your feet.  You have an injury that is not   healing.  You have redness on your legs or feet.  You feel burning or tingling in your legs or feet.  You have pain or cramps in your legs and feet.  Your legs or feet are numb.  Your feet always feel cold.  You have pain around a toenail. Get help right away if:  You have a wound, scrape, corn, or callus on your foot and: ? You have pain, swelling, or redness that gets worse. ? You have fluid or blood coming from the wound, scrape, corn, or callus. ? Your wound, scrape, corn, or callus feels warm to the touch. ? You have pus or a bad smell coming from the wound, scrape, corn, or callus. ? You have a fever. ? You have a red line going up your leg. Summary  Check your feet every day  for cuts, sores, red spots, swelling, and blisters.  Moisturize feet and legs daily.  Wear shoes that fit properly and have enough cushioning.  If you have foot problems, report any cuts, sores, or bruises to your health care provider immediately.  Schedule a complete foot exam at least once a year (annually) or more often if you have foot problems. This information is not intended to replace advice given to you by your health care provider. Make sure you discuss any questions you have with your health care provider. Document Released: 12/22/1999 Document Revised: 02/05/2017 Document Reviewed: 01/26/2016 Elsevier Patient Education  2020 [employer]  Corns and Calluses Corns are small areas of thickened skin that occur on the top, sides, or tip of a toe. They contain a cone-shaped core with a point that can press on a nerve below. This causes pain.  Calluses are areas of thickened skin that can occur anywhere on the body, including the hands, fingers, palms, soles of the feet, and heels. Calluses are usually larger than corns. What are the causes? Corns and calluses are caused by rubbing (friction) or pressure, such as from shoes that are too tight or do not fit properly. What increases the risk? Corns are more likely to develop in people who have misshapen toes (toe deformities), such as hammer toes. Calluses can occur with friction to any area of the skin. They are more likely to develop in people who:  Work with their hands.  Wear shoes that fit poorly, are too tight, or are high-heeled.  Have toe deformities. What are the signs or symptoms? Symptoms of a corn or callus include:  A hard growth on the skin.  Pain or tenderness under the skin.  Redness and swelling.  Increased discomfort while wearing tight-fitting shoes, if your feet are affected. If a corn or callus becomes infected, symptoms may include:  Redness and swelling that gets worse.  Pain.  Fluid, blood, or  pus draining from the corn or callus. How is this diagnosed? Corns and calluses may be diagnosed based on your symptoms, your medical history, and a physical exam. How is this treated? Treatment for corns and calluses may include:  Removing the cause of the friction or pressure. This may involve: ? Changing your shoes. ? Wearing shoe inserts (orthotics) or other protective layers in your shoes, such as a corn pad. ? Wearing gloves.  Applying medicine to the skin (topical medicine) to help soften skin in the hardened, thickened areas.  Removing layers of dead skin with a file to reduce the size of the corn or callus.  Removing the corn or callus with a scalpel or   laser.  Taking antibiotic medicines, if your corn or callus is infected.  Having surgery, if a toe deformity is the cause. Follow these instructions at home:   Take over-the-counter and prescription medicines only as told by your health care provider.  If you were prescribed an antibiotic, take it as told by your health care provider. Do not stop taking it even if your condition starts to improve.  Wear shoes that fit well. Avoid wearing high-heeled shoes and shoes that are too tight or too loose.  Wear any padding, protective layers, gloves, or orthotics as told by your health care provider.  Soak your hands or feet and then use a file or pumice stone to soften your corn or callus. Do this as told by your health care provider.  Check your corn or callus every day for symptoms of infection. Contact a health care provider if you:  Notice that your symptoms do not improve with treatment.  Have redness or swelling that gets worse.  Notice that your corn or callus becomes painful.  Have fluid, blood, or pus coming from your corn or callus.  Have new symptoms. Summary  Corns are small areas of thickened skin that occur on the top, sides, or tip of a toe.  Calluses are areas of thickened skin that can occur anywhere  on the body, including the hands, fingers, palms, and soles of the feet. Calluses are usually larger than corns.  Corns and calluses are caused by rubbing (friction) or pressure, such as from shoes that are too tight or do not fit properly.  Treatment may include wearing any padding, protective layers, gloves, or orthotics as told by your health care provider. This information is not intended to replace advice given to you by your health care provider. Make sure you discuss any questions you have with your health care provider. Document Released: 09/30/2003 Document Revised: 04/15/2018 Document Reviewed: 11/06/2016 Elsevier Patient Education  2020 [employer]  Onychomycosis/Fungal Toenails  WHAT IS IT? An infection that lies within the keratin of your nail plate that is caused by a fungus.  WHY ME? Fungal infections affect all ages, sexes, races, and creeds.  There may be many factors that predispose you to a fungal infection such as age, coexisting medical conditions such as diabetes, or an autoimmune disease; stress, medications, fatigue, genetics, etc.  Bottom line: fungus thrives in a warm, moist environment and your shoes offer such a location.  IS IT CONTAGIOUS? Theoretically, yes.  You do not want to share shoes, nail clippers or files with someone who has fungal toenails.  Walking around barefoot in the same room or sleeping in the same bed is unlikely to transfer the organism.  It is important to realize, however, that fungus can spread easily from one nail to the next on the same foot.  HOW DO WE TREAT THIS?  There are several ways to treat this condition.  Treatment may depend on many factors such as age, medications, pregnancy, liver and kidney conditions, etc.  It is best to ask your doctor which options are available to you.  1. No treatment.   Unlike many other medical concerns, you can live with this condition.  However for many people this can be a painful condition and may  lead to ingrown toenails or a bacterial infection.  It is recommended that you keep the nails cut short to help reduce the amount of fungal nail. 2. Topical treatment.  These range from herbal remedies to prescription   strength nail lacquers.  About 40-50% effective, topicals require twice daily application for approximately 9 to 12 months or until an entirely new nail has grown out.  The most effective topicals are medical grade medications available through physicians offices. 3. Oral antifungal medications.  With an 80-90% cure rate, the most common oral medication requires 3 to 4 months of therapy and stays in your system for a year as the new nail grows out.  Oral antifungal medications do require blood work to make sure it is a safe drug for you.  A liver function panel will be performed prior to starting the medication and after the first month of treatment.  It is important to have the blood work performed to avoid any harmful side effects.  In general, this medication safe but blood work is required. 4. Laser Therapy.  This treatment is performed by applying a specialized laser to the affected nail plate.  This therapy is noninvasive, fast, and non-painful.  It is not covered by insurance and is therefore, out of pocket.  The results have been very good with a 80-95% cure rate.  [facility] is the only practice in the area to offer this therapy. 5. Permanent Nail Avulsion.  Removing the entire nail so that a new nail will not grow back. 

## 2018-09-25 ENCOUNTER — Inpatient Hospital Stay: Admission: RE | Admit: 2018-09-25 | Payer: Medicare Other | Source: Ambulatory Visit

## 2018-09-28 NOTE — Progress Notes (Signed)
Subjective: [name] is a 68 y.o. y.o. male who presents today withdiabetes and PAD with cc of painful, discolored, thick toenails and painful callus/corn which interfere with daily activities. Pain is aggravated when wearing enclosed shoe gear and relieved with periodic professional debridement.  He voices no new pedal concerns on today's visit.  He continues on blood thinner, Plavix.   Current Outpatient Medications:  .  atorvastatin (LIPITOR) 20 MG tablet, Take 20 mg by mouth daily., Disp: , Rfl:  .  budesonide-formoterol (SYMBICORT) 160-4.5 MCG/ACT inhaler, INHALE 2 PUFFS BY MOUTH TWICE A DAY RINSE MOUTH AFTER USE., Disp: 10.2 g, Rfl: 3 .  celecoxib (CELEBREX) 200 MG capsule, Take 1 capsule (200 mg total) by mouth daily. (HOLD X 5 DAYS WHILE TAKING THE IBUPROFEN) (Patient taking differently: Take 200 mg by mouth daily. ), Disp: , Rfl:  .  clopidogrel (PLAVIX) 75 MG tablet, Take 1 tablet (75 mg total) by mouth daily., Disp: 30 tablet, Rfl: 11 .  Dexlansoprazole (DEXILANT) 30 MG capsule, Take 30- 60 min before your first and last meals of the day (Patient taking differently: Take 30 mg by mouth daily. ), Disp: 60 capsule, Rfl: 11 .  docusate sodium (COLACE) 100 MG capsule, Take 100 mg by mouth as needed. , Disp: , Rfl:  .  glipiZIDE (GLUCOTROL) 5 MG tablet, Take by mouth., Disp: , Rfl:  .  HYDROcodone-acetaminophen (NORCO) 7.5-325 MG per tablet, Take 1 tablet by mouth 2 (two) times daily as needed for moderate pain. , Disp: , Rfl:  .  JANUVIA 100 MG tablet, Take 100 mg by mouth daily. , Disp: , Rfl:  .  ketoconazole (NIZORAL) 2 % cream, Apply to both feet once daily for 4 weeks, Disp: 30 g, Rfl: 0 .  losartan (COZAAR) 50 MG tablet, Take 2 each am (Patient taking differently: Take 50 mg by mouth daily. Take 2 each am), Disp: 60 tablet, Rfl: 11 .  metFORMIN (GLUCOPHAGE) 500 MG tablet, Take 500 mg by mouth 2 (two) times daily with a meal. , Disp: , Rfl:  .  nitroGLYCERIN (NITROSTAT) 0.4 MG  SL tablet, Place 1 tablet (0.4 mg total) under the tongue every 5 (five) minutes as needed for chest pain., Disp: 90 tablet, Rfl: 3 .  ONE TOUCH ULTRA TEST test strip, , Disp: , Rfl:  .  PROAIR HFA 108 (90 BASE) MCG/ACT inhaler, Inhale 2 puffs into the lungs every 6 (six) hours as needed for wheezing or shortness of breath. , Disp: , Rfl:  .  Tiotropium Bromide Monohydrate (SPIRIVA RESPIMAT) 2.5 MCG/ACT AERS, Inhale 2 puffs into the lungs daily., Disp: 1 Inhaler, Rfl: 11 .  Trolamine Salicylate (ASPERCREME EX), Apply topically as needed., Disp: , Rfl:  .  vitamin C (ASCORBIC ACID) 500 MG tablet, Take 500 mg by mouth daily., Disp: , Rfl:  .  VOLTAREN 1 % GEL, Apply 1 application topically daily as needed (pain). , Disp: , Rfl:  .  XELJANZ 5 MG TABS, Take 1 tablet by mouth daily., Disp: 90 tablet, Rfl: 0  No Known Allergies  Objective: Vascular Examination: Capillary refill time <3 seconds x 10 digits.  Dorsalis pedis pulses absent left foot; diminished on right foot.  Posterior tibial pulses absent b/l.  Digital hair absent x 10 digits.  Skin temperature gradient WNL b/l.  Dermatological Examination: Skin with normal turgor, texture and tone b/l.  No open wounds b/l.  Toenails 1-5 b/l discolored, thick, dystrophic with subungual debris and pain with palpation to nailbeds  due to thickness of nails.  Hyperkeratotic lesion left hallux IPJ. No erythema, no edema, no drainage, no flocculence noted.   Musculoskeletal: Muscle strength 5/5 to all LE muscle groups b/l.  Neurological: Sensation intact 5/5 b/l with 10 gram monofilament.  Vibratory sensation intact b/l.  Assessment: 1. Painful onychomycosis toenails 1-5 b/l 2.  Callus left hallux 3.  NIDDM with PAD  Plan: 1. Continue diabetic foot care principles. Literature dispensed on today. 2. Toenails 1-5 b/l were debrided in length and girth without iatrogenic bleeding. 3. Hyperkeratotic lesion(s) left hallux pared with  sterile scalpel blade without incident. 4. Patient to continue soft, supportive shoe gear daily. 5. Patient to report any pedal injuries to medical professional immediately. 6. Follow up 3 months.  7. Patient/POA to call should there be a concern in the interim.

## 2018-10-01 ENCOUNTER — Ambulatory Visit
Admission: RE | Admit: 2018-10-01 | Discharge: 2018-10-01 | Disposition: A | Discharge status: Home / Self Care | Payer: Medicare Other | Source: Ambulatory Visit | Attending: Neurological Surgery | Admitting: Neurological Surgery

## 2018-10-01 ENCOUNTER — Other Ambulatory Visit: Payer: Self-pay

## 2018-10-01 DIAGNOSIS — M542 Cervicalgia: Secondary | ICD-10-CM

## 2018-10-01 MED ORDER — TRIAMCINOLONE ACETONIDE 40 MG/ML IJ SUSP (RADIOLOGY)
60.0000 mg | Freq: Once | INTRAMUSCULAR | Status: AC
  Administered 2018-10-01: 60 mg via EPIDURAL

## 2018-10-01 MED ORDER — IOPAMIDOL (ISOVUE-M 300) INJECTION 61%
1.0000 mL | Freq: Once | INTRAMUSCULAR | Status: AC | PRN
  Administered 2018-10-01: 10:00:00 1 mL via EPIDURAL

## 2018-10-01 NOTE — Discharge Instructions (Signed)

## 2018-10-08 ENCOUNTER — Other Ambulatory Visit: Payer: Self-pay | Admitting: *Deleted

## 2018-10-08 MED ORDER — BUDESONIDE-FORMOTEROL FUMARATE 160-4.5 MCG/ACT IN AERO: INHALATION_SPRAY | RESPIRATORY_TRACT | 3 refills | Status: DC

## 2018-10-15 DIAGNOSIS — E119 Type 2 diabetes mellitus without complications: Secondary | ICD-10-CM | POA: Insufficient documentation

## 2018-10-15 DIAGNOSIS — M961 Postlaminectomy syndrome, not elsewhere classified: Secondary | ICD-10-CM | POA: Insufficient documentation

## 2018-10-15 DIAGNOSIS — Z79899 Other long term (current) drug therapy: Secondary | ICD-10-CM | POA: Insufficient documentation

## 2018-10-15 DIAGNOSIS — M5412 Radiculopathy, cervical region: Secondary | ICD-10-CM | POA: Insufficient documentation

## 2018-10-15 DIAGNOSIS — F1120 Opioid dependence, uncomplicated: Secondary | ICD-10-CM | POA: Insufficient documentation

## 2018-11-24 ENCOUNTER — Other Ambulatory Visit: Payer: Self-pay

## 2018-11-24 DIAGNOSIS — I779 Disorder of arteries and arterioles, unspecified: Secondary | ICD-10-CM

## 2018-11-25 ENCOUNTER — Telehealth (HOSPITAL_COMMUNITY): Payer: Self-pay | Admitting: *Deleted

## 2018-11-25 NOTE — Telephone Encounter (Signed)

## 2018-11-26 ENCOUNTER — Encounter: Payer: Self-pay | Admitting: Family

## 2018-11-26 ENCOUNTER — Ambulatory Visit (INDEPENDENT_AMBULATORY_CARE_PROVIDER_SITE_OTHER): Payer: Medicare Other | Admitting: Family

## 2018-11-26 ENCOUNTER — Ambulatory Visit (HOSPITAL_COMMUNITY)
Admission: RE | Admit: 2018-11-26 | Discharge: 2018-11-26 | Disposition: A | Discharge status: Home / Self Care | Payer: Medicare Other | Source: Ambulatory Visit | Attending: Family | Admitting: Family

## 2018-11-26 ENCOUNTER — Ambulatory Visit (INDEPENDENT_AMBULATORY_CARE_PROVIDER_SITE_OTHER)
Admission: RE | Admit: 2018-11-26 | Discharge: 2018-11-26 | Disposition: A | Discharge status: Home / Self Care | Payer: Medicare Other | Source: Ambulatory Visit | Attending: Family | Admitting: Family

## 2018-11-26 ENCOUNTER — Other Ambulatory Visit: Payer: Self-pay

## 2018-11-26 VITALS — BP 149/90 | HR 73 | Temp 97.9°F | Resp 14 | Ht 70.0 in | Wt 176.0 lb

## 2018-11-26 DIAGNOSIS — I779 Disorder of arteries and arterioles, unspecified: Secondary | ICD-10-CM

## 2018-11-26 DIAGNOSIS — Z95828 Presence of other vascular implants and grafts: Secondary | ICD-10-CM | POA: Diagnosis not present
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
## 2018-11-26 NOTE — Patient Instructions (Signed)
Peripheral Vascular Disease  Peripheral vascular disease (PVD) is a disease of the blood vessels that are not part of your heart and brain. A simple term for PVD is poor circulation. In most cases, PVD narrows the blood vessels that carry blood from your heart to the rest of your body. This can reduce the supply of blood to your arms, legs, and internal organs, like your stomach or kidneys. However, PVD most often affects a person's lower legs and feet. Without treatment, PVD tends to get worse. PVD can also lead to acute ischemic limb. This is when an arm or leg suddenly cannot get enough blood. This is a medical emergency. Follow these instructions at home: Lifestyle  Do not use any products that contain nicotine or tobacco, such as cigarettes and e-cigarettes. If you need help quitting, ask your doctor.  Lose weight if you are overweight. Or, stay at a healthy weight as told by your doctor.  Eat a diet that is low in fat and cholesterol. If you need help, ask your doctor.  Exercise regularly. Ask your doctor for activities that are right for you. General instructions  Take over-the-counter and prescription medicines only as told by your doctor.  Take good care of your feet: ? Wear comfortable shoes that fit well. ? Check your feet often for any cuts or sores.  Keep all follow-up visits as told by your doctor This is important. Contact a doctor if:  You have cramps in your legs when you walk.  You have leg pain when you are at rest.  You have coldness in a leg or foot.  Your skin changes.  You are unable to get or have an erection (erectile dysfunction).  You have cuts or sores on your feet that do not heal. Get help right away if:  Your arm or leg turns cold, numb, and blue.  Your arms or legs become red, warm, swollen, painful, or numb.  You have chest pain.  You have trouble breathing.  You suddenly have weakness in your face, arm, or leg.  You become very  confused or you cannot speak.  You suddenly have a very bad headache.  You suddenly cannot see. Summary  Peripheral vascular disease (PVD) is a disease of the blood vessels.  A simple term for PVD is poor circulation. Without treatment, PVD tends to get worse.  Treatment may include exercise, low fat and low cholesterol diet, and quitting smoking. This information is not intended to replace advice given to you by your health care provider. Make sure you discuss any questions you have with your health care provider. Document Released: 03/20/2009 Document Revised: 12/06/2016 Document Reviewed: 02/01/2016 Elsevier Patient Education  2020 [employer]  

## 2018-12-16 ENCOUNTER — Telehealth: Payer: Self-pay

## 2018-12-16 NOTE — Telephone Encounter (Signed)
Pt called and said that he has a few sores on his legs that he needs someone to look at. He said that he was in recently and seen but Ms Nash Mantis told him if he had sores to come up that he needed to call and have [place] look at them. He said that they are not necessarily painful but he is concerned.   Appt made for pt to be seen.   York Cerise, CMA

## 2018-12-17 ENCOUNTER — Encounter: Payer: Self-pay | Admitting: Family

## 2018-12-17 ENCOUNTER — Other Ambulatory Visit: Payer: Self-pay

## 2018-12-17 ENCOUNTER — Ambulatory Visit (INDEPENDENT_AMBULATORY_CARE_PROVIDER_SITE_OTHER): Payer: Medicare Other | Admitting: Family

## 2018-12-17 VITALS — BP 134/82 | HR 99 | Temp 98.0°F | Resp 16 | Ht 70.0 in | Wt 175.8 lb

## 2018-12-17 DIAGNOSIS — Z95828 Presence of other vascular implants and grafts: Secondary | ICD-10-CM

## 2018-12-17 DIAGNOSIS — Z87891 Personal history of nicotine dependence: Secondary | ICD-10-CM

## 2018-12-17 DIAGNOSIS — I779 Disorder of arteries and arterioles, unspecified: Secondary | ICD-10-CM | POA: Diagnosis not present

## 2018-12-17 NOTE — Patient Instructions (Signed)
Peripheral Vascular Disease  Peripheral vascular disease (PVD) is a disease of the blood vessels that are not part of your heart and brain. A simple term for PVD is poor circulation. In most cases, PVD narrows the blood vessels that carry blood from your heart to the rest of your body. This can reduce the supply of blood to your arms, legs, and internal organs, like your stomach or kidneys. However, PVD most often affects a person's lower legs and feet. Without treatment, PVD tends to get worse. PVD can also lead to acute ischemic limb. This is when an arm or leg suddenly cannot get enough blood. This is a medical emergency. Follow these instructions at home: Lifestyle  Do not use any products that contain nicotine or tobacco, such as cigarettes and e-cigarettes. If you need help quitting, ask your doctor.  Lose weight if you are overweight. Or, stay at a healthy weight as told by your doctor.  Eat a diet that is low in fat and cholesterol. If you need help, ask your doctor.  Exercise regularly. Ask your doctor for activities that are right for you. General instructions  Take over-the-counter and prescription medicines only as told by your doctor.  Take good care of your feet: ? Wear comfortable shoes that fit well. ? Check your feet often for any cuts or sores.  Keep all follow-up visits as told by your doctor This is important. Contact a doctor if:  You have cramps in your legs when you walk.  You have leg pain when you are at rest.  You have coldness in a leg or foot.  Your skin changes.  You are unable to get or have an erection (erectile dysfunction).  You have cuts or sores on your feet that do not heal. Get help right away if:  Your arm or leg turns cold, numb, and blue.  Your arms or legs become red, warm, swollen, painful, or numb.  You have chest pain.  You have trouble breathing.  You suddenly have weakness in your face, arm, or leg.  You become very  confused or you cannot speak.  You suddenly have a very bad headache.  You suddenly cannot see. Summary  Peripheral vascular disease (PVD) is a disease of the blood vessels.  A simple term for PVD is poor circulation. Without treatment, PVD tends to get worse.  Treatment may include exercise, low fat and low cholesterol diet, and quitting smoking. This information is not intended to replace advice given to you by your health care provider. Make sure you discuss any questions you have with your health care provider. Document Released: 03/20/2009 Document Revised: 12/06/2016 Document Reviewed: 02/01/2016 Elsevier Patient Education  2020 [employer]  
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
## 2018-12-22 ENCOUNTER — Other Ambulatory Visit: Payer: Self-pay

## 2018-12-22 DIAGNOSIS — I779 Disorder of arteries and arterioles, unspecified: Secondary | ICD-10-CM

## 2018-12-23 ENCOUNTER — Encounter: Payer: Self-pay | Admitting: Podiatry

## 2018-12-23 ENCOUNTER — Ambulatory Visit (INDEPENDENT_AMBULATORY_CARE_PROVIDER_SITE_OTHER): Payer: Medicare Other | Admitting: Podiatry

## 2018-12-23 ENCOUNTER — Other Ambulatory Visit: Payer: Self-pay

## 2018-12-23 DIAGNOSIS — M79674 Pain in right toe(s): Secondary | ICD-10-CM

## 2018-12-23 DIAGNOSIS — E1159 Type 2 diabetes mellitus with other circulatory complications: Secondary | ICD-10-CM | POA: Diagnosis not present

## 2018-12-23 DIAGNOSIS — M79675 Pain in left toe(s): Secondary | ICD-10-CM

## 2018-12-23 DIAGNOSIS — B351 Tinea unguium: Secondary | ICD-10-CM

## 2018-12-23 DIAGNOSIS — L84 Corns and callosities: Secondary | ICD-10-CM | POA: Diagnosis not present

## 2018-12-23 MED ORDER — NONFORMULARY OR COMPOUNDED ITEM: 3 refills | Status: DC

## 2018-12-23 NOTE — Progress Notes (Signed)
Subjective: [name] is a 68 y.o. y.o. male who presents for preventative diabetic foot care today with h/o PAD and cc of painful, discolored, thick toenails and painful callus/corn which interfere with daily activities. Pain is aggravated when wearing enclosed shoe gear. Pain is relieved with periodic professional debridement.  He relates he had a sore on his right calf, a suspected spider bite. He saw his Vascular for the wound. Has applied Neosporin Cream and has healed.  [name], MD is his PCP.   He remains on blood thinner, Plavix.  Current Outpatient Medications on File Prior to Visit  Medication Sig Dispense Refill  . atorvastatin (LIPITOR) 20 MG tablet Take 20 mg by mouth daily.    . budesonide-formoterol (SYMBICORT) 160-4.5 MCG/ACT inhaler INHALE 2 PUFFS BY MOUTH TWICE A DAY RINSE MOUTH AFTER USE. 10.2 g 3  . celecoxib (CELEBREX) 200 MG capsule Take 1 capsule (200 mg total) by mouth daily. (HOLD X 5 DAYS WHILE TAKING THE IBUPROFEN) (Patient taking differently: Take 200 mg by mouth daily. )    . clopidogrel (PLAVIX) 75 MG tablet Take 1 tablet (75 mg total) by mouth daily. 30 tablet 11  . Dexlansoprazole (DEXILANT) 30 MG capsule Take 30- 60 min before your first and last meals of the day (Patient taking differently: Take 30 mg by mouth daily. ) 60 capsule 11  . docusate sodium (COLACE) 100 MG capsule Take 100 mg by mouth as needed.     Marland Kitchen glipiZIDE (GLUCOTROL) 5 MG tablet Take by mouth.    Marland Kitchen HYDROcodone-acetaminophen (NORCO) 7.5-325 MG per tablet Take 1 tablet by mouth 2 (two) times daily as needed for moderate pain.     Marland Kitchen JANUVIA 100 MG tablet Take 100 mg by mouth daily.     Marland Kitchen ketoconazole (NIZORAL) 2 % cream Apply to both feet once daily for 4 weeks 30 g 0  . losartan (COZAAR) 50 MG tablet Take 2 each am (Patient taking differently: Take 50 mg by mouth daily. Take 2 each am) 60 tablet 11  . metFORMIN (GLUCOPHAGE) 500 MG tablet Take 500 mg by mouth 2 (two) times daily with a  meal.     . nitroGLYCERIN (NITROSTAT) 0.4 MG SL tablet Place 1 tablet (0.4 mg total) under the tongue every 5 (five) minutes as needed for chest pain. 90 tablet 3  . ONE TOUCH ULTRA TEST test strip     . PROAIR HFA 108 (90 BASE) MCG/ACT inhaler Inhale 2 puffs into the lungs every 6 (six) hours as needed for wheezing or shortness of breath.     . Tiotropium Bromide Monohydrate (SPIRIVA RESPIMAT) 2.5 MCG/ACT AERS Inhale 2 puffs into the lungs daily. 1 Inhaler 11  . Trolamine Salicylate (ASPERCREME EX) Apply topically as needed.    . vitamin C (ASCORBIC ACID) 500 MG tablet Take 500 mg by mouth daily.    . VOLTAREN 1 % GEL Apply 1 application topically daily as needed (pain).     Morrie Sheldon 5 MG TABS Take 1 tablet by mouth daily. 90 tablet 0   No current facility-administered medications on file prior to visit.    No Known Allergies  Objective: There were no vitals filed for this visit.  Vascular Examination: Capillary refill time to digits <3 seconds.  Dorsalis pedis pulses absent left foot; diminished right foot.  Posterior tibial pulses absent b/l  No digital hair x 10 digits.  Skin temperature gradient WNL b/l.  Dermatological Examination: Skin with normal turgor, texture and tone  b/l.  Toenails 1-5 b/l discolored, thick, dystrophic with subungual debris and pain with palpation to nailbeds due to thickness of nails.  Hyperkeratotic lesion left hallux IPJ. No erythema, no edema, no drainage, no flocculence.   Musculoskeletal: Muscle strength 5/5 b/l to all LE muscle groups.  He is wearing pointed toe leather boots which do not accommodate his bunion deformity and pose a risk of rubbing and causing a wound.  Neurological: Sensation intact 5/5 b/l with 10 gram monofilament.  Vibratory sensation intact b/l.  Assessment: 1. Painful onychomycosis toenails 1-5 b/l 2. Callus left hallux 3. NIDDM with PAD  Plan: 1. Discuss diabetic foot care principles. Literature  dispensed.Advised him to discontinue wearing the black leather boots as they could rub a blister on his bunion areas. Discussed topical treatment for onychomycosis. Patient opted for topical treatment with compounded medication. Rx written for nonformulary compounding topical antifungal: [facility]: Antifungal cream - Terbinafine 3%, Fluconazole 2%, Tea Tree Oil 5%, Urea 10%, Ibuprofen 2% in DMSO Suspension #65ml. Apply to the affected nail(s) at bedtime. 3.  Toenails 1-5 b/l were debrided in length and girth without iatrogenic bleeding. 4.  Hyperkeratotic lesion(s) left hallux pared with sterile chisel blade and gently smoothed with burr. 5.  Patient to continue soft, supportive shoe gear daily. 6.  Patient to report any pedal injuries to medical professional immediately. 7.  Follow up 3 months.  8.  Patient/POA to call should there be a concern in the interim.

## 2018-12-23 NOTE — Patient Instructions (Addendum)
[facility] [phone]   [email or web address]    Diabetes Mellitus and Foot Care Foot care is an important part of your health, especially when you have diabetes. Diabetes may cause you to have problems because of poor blood flow (circulation) to your feet and legs, which can cause your skin to:  Become thinner and drier.  Break more easily.  Heal more slowly.  Peel and crack. You may also have nerve damage (neuropathy) in your legs and feet, causing decreased feeling in them. This means that you may not notice minor injuries to your feet that could lead to more serious problems. Noticing and addressing any potential problems early is the best way to prevent future foot problems. How to care for your feet Foot hygiene  Wash your feet daily with warm water and mild soap. Do not use hot water. Then, pat your feet and the areas between your toes until they are completely dry. Do not soak your feet as this can dry your skin.  Trim your toenails straight across. Do not dig under them or around the cuticle. File the edges of your nails with an emery board or nail file.  Apply a moisturizing lotion or petroleum jelly to the skin on your feet and to dry, brittle toenails. Use lotion that does not contain alcohol and is unscented. Do not apply lotion between your toes. Shoes and socks  Wear clean socks or stockings every day. Make sure they are not too tight. Do not wear knee-high stockings since they may decrease blood flow to your legs.  Wear shoes that fit properly and have enough cushioning. Always look in your shoes before you put them on to be sure there are no objects inside.  To break in new shoes, wear them for just a few hours a day. This prevents injuries on your feet. Wounds, scrapes, corns, and calluses  Check your feet daily for blisters, cuts, bruises, sores, and redness. If you cannot see the bottom of your feet, use a mirror or ask someone for help.  Do not  cut corns or calluses or try to remove them with medicine.  If you find a minor scrape, cut, or break in the skin on your feet, keep it and the skin around it clean and dry. You may clean these areas with mild soap and water. Do not clean the area with peroxide, alcohol, or iodine.  If you have a wound, scrape, corn, or callus on your foot, look at it several times a day to make sure it is healing and not infected. Check for: ? Redness, swelling, or pain. ? Fluid or blood. ? Warmth. ? Pus or a bad smell. General instructions  Do not cross your legs. This may decrease blood flow to your feet.  Do not use heating pads or hot water bottles on your feet. They may burn your skin. If you have lost feeling in your feet or legs, you may not know this is happening until it is too late.  Protect your feet from hot and cold by wearing shoes, such as at the beach or on hot pavement.  Schedule a complete foot exam at least once a year (annually) or more often if you have foot problems. If you have foot problems, report any cuts, sores, or bruises to your health care provider immediately. Contact a health care provider if:  You have a medical condition that increases your risk of infection and you have any cuts, sores, or bruises on your   feet.  You have an injury that is not healing.  You have redness on your legs or feet.  You feel burning or tingling in your legs or feet.  You have pain or cramps in your legs and feet.  Your legs or feet are numb.  Your feet always feel cold.  You have pain around a toenail. Get help right away if:  You have a wound, scrape, corn, or callus on your foot and: ? You have pain, swelling, or redness that gets worse. ? You have fluid or blood coming from the wound, scrape, corn, or callus. ? Your wound, scrape, corn, or callus feels warm to the touch. ? You have pus or a bad smell coming from the wound, scrape, corn, or callus. ? You have a fever. ? You  have a red line going up your leg. Summary  Check your feet every day for cuts, sores, red spots, swelling, and blisters.  Moisturize feet and legs daily.  Wear shoes that fit properly and have enough cushioning.  If you have foot problems, report any cuts, sores, or bruises to your health care provider immediately.  Schedule a complete foot exam at least once a year (annually) or more often if you have foot problems. This information is not intended to replace advice given to you by your health care provider. Make sure you discuss any questions you have with your health care provider. Document Released: 12/22/1999 Document Revised: 02/05/2017 Document Reviewed: 01/26/2016 Elsevier Patient Education  2020 Elsevier Inc.  

## 2019-01-05 ENCOUNTER — Other Ambulatory Visit: Payer: Self-pay | Admitting: *Deleted

## 2019-01-05 DIAGNOSIS — Z95828 Presence of other vascular implants and grafts: Secondary | ICD-10-CM

## 2019-01-05 DIAGNOSIS — I739 Peripheral vascular disease, unspecified: Secondary | ICD-10-CM

## 2019-01-11 ENCOUNTER — Other Ambulatory Visit: Payer: Self-pay | Admitting: Rheumatology

## 2019-01-11 DIAGNOSIS — Z79899 Other long term (current) drug therapy: Secondary | ICD-10-CM

## 2019-01-12 NOTE — Telephone Encounter (Signed)
We will have to review labs prior to refill of Xeljanz.

## 2019-01-12 NOTE — Telephone Encounter (Signed)
Last Visit: 08/06/18 Next Visit: 01/18/19 Labs: 07/18/18 Glucose is elevated-150. Rest of CMP WNL. CBC WNL.  Patient advised he is due to update labs. Lab orders released and will update this week.  Okay to refill 30 day supply Morrie Sheldon?

## 2019-01-15 ENCOUNTER — Telehealth: Payer: Self-pay | Admitting: Rheumatology

## 2019-01-15 DIAGNOSIS — Z79899 Other long term (current) drug therapy: Secondary | ICD-10-CM

## 2019-01-15 NOTE — Telephone Encounter (Signed)
Patient requested his labwork orders be sent to [facility] in [place].  Patient states he will be going today, 01/15/19.

## 2019-01-15 NOTE — Telephone Encounter (Signed)
Lab orders released for [facility].  

## 2019-01-16 LAB — CMP14+EGFR
ALT: 23 IU/L (ref 0–44)
AST: 25 IU/L (ref 0–40)
Albumin/Globulin Ratio: 1.7 (ref 1.2–2.2)
Albumin: 4.3 g/dL (ref 3.8–4.8)
Alkaline Phosphatase: 78 IU/L (ref 39–117)
BUN/Creatinine Ratio: 12 (ref 10–24)
BUN: 12 mg/dL (ref 8–27)
Bilirubin Total: 0.2 mg/dL (ref 0.0–1.2)
CO2: 25 mmol/L (ref 20–29)
Calcium: 9.3 mg/dL (ref 8.6–10.2)
Chloride: 102 mmol/L (ref 96–106)
Creatinine, Ser: 0.99 mg/dL (ref 0.76–1.27)
GFR calc Af Amer: 90 mL/min/{1.73_m2} (ref >59)
GFR calc non Af Amer: 78 mL/min/{1.73_m2} (ref >59)
Globulin, Total: 2.6 g/dL (ref 1.5–4.5)
Glucose: 240 mg/dL — ABNORMAL HIGH (ref 65–99)
Potassium: 4 mmol/L (ref 3.5–5.2)
Sodium: 141 mmol/L (ref 134–144)
Total Protein: 6.9 g/dL (ref 6.0–8.5)

## 2019-01-16 LAB — CBC WITH DIFFERENTIAL/PLATELET
Basophils Absolute: 0.1 10*3/uL (ref 0.0–0.2)
Basos: 1 %
EOS (ABSOLUTE): 0.1 10*3/uL (ref 0.0–0.4)
Eos: 2 %
Hematocrit: 38.2 % (ref 37.5–51.0)
Hemoglobin: 12.9 g/dL — ABNORMAL LOW (ref 13.0–17.7)
Immature Grans (Abs): 0 10*3/uL (ref 0.0–0.1)
Immature Granulocytes: 0 %
Lymphocytes Absolute: 1.9 10*3/uL (ref 0.7–3.1)
Lymphs: 29 %
MCH: 30.2 pg (ref 26.6–33.0)
MCHC: 33.8 g/dL (ref 31.5–35.7)
MCV: 90 fL (ref 79–97)
Monocytes Absolute: 0.4 10*3/uL (ref 0.1–0.9)
Monocytes: 6 %
Neutrophils Absolute: 4 10*3/uL (ref 1.4–7.0)
Neutrophils: 62 %
Platelets: 270 10*3/uL (ref 150–450)
RBC: 4.27 x10E6/uL (ref 4.14–5.80)
RDW: 13.4 % (ref 11.6–15.4)
WBC: 6.4 10*3/uL (ref 3.4–10.8)

## 2019-01-16 LAB — LIPID PANEL
Chol/HDL Ratio: 2.9 ratio (ref 0.0–5.0)
Cholesterol, Total: 163 mg/dL (ref 100–199)
HDL: 56 mg/dL (ref >39)
LDL Chol Calc (NIH): 84 mg/dL (ref 0–99)
Triglycerides: 134 mg/dL (ref 0–149)
VLDL Cholesterol Cal: 23 mg/dL (ref 5–40)

## 2019-01-18 ENCOUNTER — Ambulatory Visit: Payer: Medicare Other | Admitting: Rheumatology

## 2019-01-18 NOTE — Telephone Encounter (Signed)
Glucose is elevated at 240.  Please advise patient and forward labs to his PCP.  Rest the labs are stable.

## 2019-01-20 NOTE — Progress Notes (Signed)
Office Visit Note  Patient: [name]             Date of Birth: 14-Jun-1950           MRN: [id number]             PCP: [name], MD Referring: [name], MD Visit Date: 01/25/2019 Occupation: @GUAROCC @  Subjective:  Right shoulder joint pain   History of Present Illness: [name] is a 69 y.o. male with history of seronegative rheumatoid arthritis, osteoarthritis, and DDD. He is prescribed xeljanz 5 mg 1 tablet by mouth daily.  He needs a refill today since he ran out of his prescription 2-3 days ago. He is currently having right shoulder joint pain, left wrist joints, and lower back.  He has been wearing a left wrist compression sleeve recently.  He denies any joint swelling. He states he has seen an orthopedist in the past for right shoulder pain and had a cortisone injection 6 months ago, which only provided temporary relief.   Activities of Daily Living:  Patient reports morning stiffness for 10 minutes.   Patient Reports nocturnal pain.  Difficulty dressing/grooming: Denies Difficulty climbing stairs: Reports Difficulty getting out of chair: Reports Difficulty using hands for taps, buttons, cutlery, and/or writing: Reports  Review of Systems  Constitutional: Negative for fatigue and night sweats.  HENT: Negative for mouth sores, mouth dryness and nose dryness.   Eyes: Negative for redness, itching and dryness.  Respiratory: Positive for shortness of breath. Negative for cough and hemoptysis.        Due to COPD   Cardiovascular: Negative for chest pain, palpitations, hypertension, irregular heartbeat and swelling in legs/feet.  Gastrointestinal: Negative for blood in stool, constipation and diarrhea.  Endocrine: Negative for increased urination.  Genitourinary: Negative for difficulty urinating and painful urination.  Musculoskeletal: Positive for arthralgias, joint pain and morning stiffness. Negative for joint swelling, myalgias, muscle weakness, muscle  tenderness and myalgias.  Skin: Negative for color change, rash, hair loss, nodules/bumps, skin tightness, ulcers and sensitivity to sunlight.  Allergic/Immunologic: Negative for susceptible to infections.  Neurological: Positive for dizziness. Negative for fainting, numbness, headaches, memory loss and night sweats.  Hematological: Negative for bruising/bleeding tendency and swollen glands.  Psychiatric/Behavioral: Negative for depressed mood, confusion and sleep disturbance. The patient is not nervous/anxious.     PMFS History:  Patient Active Problem List   Diagnosis Date Noted  . Puncture wound of finger of left hand 08/08/2018  . Puncture wound of finger, right, complicated, initial encounter 08/05/2018  . DOE (dyspnea on exertion) 11/04/2017  . Muscle cramps 07/04/2017  . Contracture of left elbow 07/04/2017  . High risk medication use 06/23/2017  . Primary osteoarthritis of both hands 06/23/2017  . Primary osteoarthritis of both feet 06/23/2017  . 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 factor ([place]) 06/23/2017  . DDD (degenerative disc disease), cervical 06/23/2017  . Rotator cuff syndrome of right shoulder 06/06/2017  . History of  bilateral carpal tunnel release 06/06/2017  . Ganglion cyst of wrist, left 06/06/2017  . Type 2 diabetes mellitus with stage 3 chronic kidney disease, without long-term current use of insulin ([place]) 06/06/2017  . Chest pain, musculoskeletal  vs IBS 04/05/2017  . COPD with acute exacerbation ([place]) 03/13/2017  . Plantar fasciitis 02/21/2017  . Iliac artery stenosis, right ([place]) 02/12/2017  . Hematochezia 02/10/2017  . Abnormal CT of the abdomen 02/10/2017  . Cervical spondylosis 10/11/2016  . Bony growth 10/13/2015  .  Pain in joint, lower leg 03/02/2014  . Discoloration of skin-Left dorsum foot 09/28/2013  . Swelling of limb-Left Calf / Leg 09/28/2013  . PVD (peripheral vascular disease) ([place]) 10/14/2012  . Pain in limb-  Left popliteal and calf 10/14/2012  . Hyperlipidemia   . Essential hypertension 03/05/2012  . 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 Generalized osteoarthritis of multiple sites 02/06/2011  . History of arterial bypass of lower limb 12/25/2010  . Atherosclerosis of native arteries of extremity with intermittent claudication ([place]) 12/25/2010  . Coronary atherosclerosis of native coronary artery 04/16/2008  . PERIPHERAL VASCULAR DISEASE 04/16/2008  . COPD  GOLD II 04/16/2008  . GASTROESOPHAGEAL REFLUX DISEASE 04/16/2008  . RHEUMATOID ARTHRITIS 04/16/2008  . DIABETES MELLITUS, BORDERLINE 04/16/2008    Past Medical History:  Diagnosis Date  . Arthritis    RA  . Bronchitis   . Carotid artery occlusion   . Cervical spine disease    Neck surgery in 2007 with Dr. Joya Salm  . COPD (chronic obstructive pulmonary disease) ([place])   . Coronary atherosclerosis of native coronary artery    Nonobstrtuctive by catheterization 2007 and 2013  . DVT (deep venous thrombosis) ([place])   . Essential hypertension, benign   . GERD (gastroesophageal reflux disease)   . Hyperlipidemia   . PAD (peripheral artery disease) ([place])    s/p left fem-pop bypass 2007; PTA and stenting of right iliac artery 09/2011  . Rheumatoid arthritis(714.0)   . Type 2 diabetes mellitus (H[facility])     Family History  Problem Relation Age of Onset  . Heart attack Mother 76  . Deep vein thrombosis Mother   . Diabetes Mother   . Hyperlipidemia Mother   . Hypertension Mother   . Peripheral vascular disease Mother   . Stroke Father   . Diabetes Father   . Hyperlipidemia Father   . Hypertension Father   . Peripheral vascular disease Father   . Diabetes Sister   . Hyperlipidemia Sister   . Hypertension Sister   . Diabetes Brother   . Hyperlipidemia Brother   . Cancer Brother   . Deep vein thrombosis Brother   . Hypertension Brother   . Peripheral vascular disease Brother   . Hyperlipidemia Son   . Cancer Son   .  Hypertension Son   . Colon cancer Neg Hx    Past Surgical History:  Procedure Laterality Date  . ABDOMINAL AORTAGRAM N/A 10/03/2011   Procedure: ABDOMINAL Maxcine Ham;  Surgeon: Conrad [place], MD;  Location: [facility] CATH LAB;  Service: Cardiovascular;  Laterality: N/A;  . ABDOMINAL AORTOGRAM W/LOWER EXTREMITY N/A 01/16/2017   Procedure: ABDOMINAL AORTOGRAM W/LOWER EXTREMITY;  Surgeon: Conrad River Road, MD;  Location: [facility] CV LAB;  Service: Cardiovascular;  Laterality: N/A;  . CERVICAL DISC SURGERY    . COLONOSCOPY WITH PROPOFOL N/A 02/17/2017   Procedure: COLONOSCOPY WITH PROPOFOL;  Surgeon: Daneil Dolin, MD;  Location: [facility];  Service: Endoscopy;  Laterality: N/A;  2:45pm  . FEMORAL-POPLITEAL BYPASS GRAFT  11/06/2005  . ILIAC ARTERY STENT  10-03-11   Right CIA stenting  . PERIPHERAL VASCULAR BALLOON ANGIOPLASTY Right 01/16/2017   Procedure: PERIPHERAL VASCULAR BALLOON ANGIOPLASTY;  Surgeon: Conrad [place], MD;  Location: [facility] CV LAB;  Service: Cardiovascular;  Laterality: Right;  common iliac   . PR VEIN BYPASS GRAFT,AORTO-FEM-POP  06-22-2010   Redo Left Fem-Pop  . Repair of left arm fracture    . 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 SURGERY SCROTAL /  TESTICULAR     Social History   Social History Narrative   The patient lives in [place].    Immunization History  Administered Date(s) Administered  . Influenza, High Dose Seasonal PF 10/14/2016  . Influenza-Unspecified 11/08/2008  . PPD Test 09/22/2007  . Tdap 08/05/2018     Objective: Vital Signs: BP 130/77 (BP Location: Left Arm, Patient Position: Sitting, Cuff Size: Normal)   Pulse (!) 108   Resp 14   Ht 5\' 10"  (1.778 m)   Wt 179 lb 3.2 oz (81.3 kg)   BMI 25.71 kg/m    Physical Exam Vitals and nursing note reviewed.  Constitutional:      Appearance: He is well-developed.  HENT:     Head: Normocephalic and atraumatic.  Eyes:     Conjunctiva/sclera: Conjunctivae normal.     Pupils: Pupils are equal, round, and  reactive to light.  Cardiovascular:     Rate and Rhythm: Normal rate and regular rhythm.     Heart sounds: Normal heart sounds.  Pulmonary:     Effort: Pulmonary effort is normal.     Breath sounds: Normal breath sounds.  Abdominal:     General: Bowel sounds are normal.     Palpations: Abdomen is soft.  Musculoskeletal:     Cervical back: Normal range of motion and neck supple.  Skin:    General: Skin is warm and dry.     Capillary Refill: Capillary refill takes less than 2 seconds.  Neurological:     Mental Status: He is alert and oriented to person, place, and time.  Psychiatric:        Behavior: Behavior normal.      Musculoskeletal Exam: C-spine limited ROM with lateral rotation.  Right shoulder painful and limited abduction.  Left shoulder full ROM.  Left elbow joint contracture. Right elbow good ROM.  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  PIP and DIP synovial thickening consistent with osteoarthritis. Tenderness of the left wrist joint.   Hip joints, knee joints, ankle joints good ROM with no discomfort.  No warmth or effusion of knee joints.  No tenderness or inflammation of ankle joints.    CDAI Exam: CDAI Score: -- Patient Global: --; Provider Global: -- Swollen: 0 ; Tender: 2  Joint Exam 01/25/2019      Right  Left  Glenohumeral   Tender     Wrist      Tender     Investigation: No additional findings.  Imaging: XR Shoulder Right  Result Date: 01/25/2019 No glenohumeral joint space narrowing was noted.  Mild cystic changes were noted over the inferior aspect of the humeral head.  Acromioclavicular narrowing and spurring was noted.  No chondrocalcinosis was noted. Impression: These findings are consistent with acromioclavicular arthritis.   Recent Labs: Lab Results  Component Value Date   WBC 6.4 01/15/2019   HGB 12.9 (L) 01/15/2019   PLT 270 01/15/2019   NA 141 01/15/2019   K 4.0 01/15/2019   CL 102 01/15/2019   CO2 25 01/15/2019   GLUCOSE 240 (H)  01/15/2019   BUN 12 01/15/2019   CREATININE 0.99 01/15/2019   BILITOT 0.2 01/15/2019   ALKPHOS 78 01/15/2019   AST 25 01/15/2019   ALT 23 01/15/2019   PROT 6.9 01/15/2019   ALBUMIN 4.3 01/15/2019   CALCIUM 9.3 01/15/2019   GFRAA 90 01/15/2019   QFTBGOLDPLUS NEGATIVE 08/06/2018    Speciality Comments: Prior therapy includes: methotrexate ( d/c alcohol use), Arava (d/c patient preference), Mason Jim (patient  declined)  Procedures:  No procedures performed Allergies: Patient has no known allergies.    Assessment / Plan:     Visit Diagnoses: 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 factor ([place]): He has no synovitis on exam. He presents today with acute on chronic right shoulder joint pain. X-rays of the right shoulder were obtained today.  We opted out of performing a cortisone injection due to uncontrolled diabetes and no longer being on insulin.  He has chronic left wrist joint pain and has tenderness on exam but no synovitis noted. He takes Celebrex 200 mg 1 capsule by mouth daily for pain relief. He is clinically doing well on Xeljanz 5 mg 1 tablet by mouth daily.  He will continue on this current regimen.  He was advised to notify us if he develops increased joint pain or inflammation.  He will follow up in 5 months.   High risk medication use - Xeljanz 5 mg 1 tablet by mouth daily.  Methotrexate discontinued due to alcohol use, Orencia declined by patient, treated with Morrie Sheldon initially at [facility]. CBC and CMP were drawn on 01/15/19.  He is due to update lab work in April and every 3 months.  Lipid panel WNL on 01/15/19. TB gold negative on 08/06/18.    Contracture of left elbow -  S/p previous injury/surgery-Unchanged.   Primary osteoarthritis of both hands: He has PIP and DIP synovial thickening consistent with osteoarthritis of both hands.  No synovitis noted.  Joint protection and muscle strengthening were discussed.   Primary osteoarthritis of both  feet: He is not having any feet pain or inflammation at this time.  DDD (degenerative disc disease), cervical: He has limited ROM with lateral rotation.  He is not having any symptoms of radiculopathy at this time.   Rotator cuff syndrome of right shoulder: He has limited abduction and internal rotation of the right shoulder joint.  He has been experiencing acute on chronic right shoulder pain.  X-rays of the right shoulder were updated today.  Treatment options were discussed.  We opted out of performing a cortisone injection due to uncontrolled diabetes and no longer being on insulin. He will be provided a handout of shoulder exercises to perform.   Chronic right shoulder pain -He presents today with right shoulder joint pain, which has been an ongoing concern.  He had a right shoulder joint x-ray on 06/06/17, which revealed a high rising humerus and type II acromion.  He reports he had a shoulder joint coritsone injection about 6 months ago that provided only temporary relief. X-rays of the right shoulder were updated today. We opted out of performing a cortisone injection due to uncontrolled diabetes.  He would benefit from physical therapy.  He was given a handout of home exercises.    Plan: XR Shoulder Right  Muscle cramps: He has not been experiencing any muscle cramps or muscle spasms recently.   History of  bilateral carpal tunnel release  Ganglion cyst of wrist, left  Other medical conditions are listed as follows:   Mixed hyperlipidemia  COPD  GOLD II  Atherosclerosis of native coronary artery of native heart without angina pectoris  PVD (peripheral vascular disease) (HCC)  Essential hypertension  History of diabetes mellitus - with stage 3 chronic kidney disease, without long-term current use of insulin    Orders: Orders Placed This Encounter  Procedures  . XR Shoulder Right   Meds ordered this encounter  Medications  . XELJANZ 5 MG  TABS    Sig: Take 1 tablet by mouth  daily.    Dispense:  90 tablet    Refill:  0    Face-to-face time spent with patient was 30 minutes. Greater than 50% of time was spent in counseling and coordination of care.  Follow-Up Instructions: Return in about 5 months (around 06/25/2019) for Rheumatoid arthritis, Osteoarthritis, DDD.   Bo Merino, MD   Scribed by-  Hazel Sams PA-C  Note - This record has been created using Dragon software.  Chart creation errors have been sought, but may not always  have been located. Such creation errors do not reflect on  the standard of medical care.

## 2019-01-25 ENCOUNTER — Encounter: Payer: Self-pay | Admitting: Rheumatology

## 2019-01-25 ENCOUNTER — Ambulatory Visit (INDEPENDENT_AMBULATORY_CARE_PROVIDER_SITE_OTHER): Payer: Medicare Other | Admitting: Rheumatology

## 2019-01-25 ENCOUNTER — Other Ambulatory Visit: Payer: Self-pay

## 2019-01-25 ENCOUNTER — Ambulatory Visit: Payer: Self-pay

## 2019-01-25 VITALS — BP 130/77 | HR 108 | Resp 14 | Ht 70.0 in | Wt 179.2 lb

## 2019-01-25 DIAGNOSIS — J449 Chronic obstructive pulmonary disease, unspecified: Secondary | ICD-10-CM

## 2019-01-25 DIAGNOSIS — M0609 Rheumatoid arthritis without rheumatoid factor, multiple sites: Secondary | ICD-10-CM

## 2019-01-25 DIAGNOSIS — M24522 Contracture, left elbow: Secondary | ICD-10-CM

## 2019-01-25 DIAGNOSIS — R252 Cramp and spasm: Secondary | ICD-10-CM

## 2019-01-25 DIAGNOSIS — I10 Essential (primary) hypertension: Secondary | ICD-10-CM

## 2019-01-25 DIAGNOSIS — M19071 Primary osteoarthritis, right ankle and foot: Secondary | ICD-10-CM | POA: Diagnosis not present

## 2019-01-25 DIAGNOSIS — M25511 Pain in right shoulder: Secondary | ICD-10-CM | POA: Diagnosis not present

## 2019-01-25 DIAGNOSIS — I739 Peripheral vascular disease, unspecified: Secondary | ICD-10-CM

## 2019-01-25 DIAGNOSIS — G8929 Other chronic pain: Secondary | ICD-10-CM | POA: Diagnosis not present

## 2019-01-25 DIAGNOSIS — M19041 Primary osteoarthritis, right hand: Secondary | ICD-10-CM

## 2019-01-25 DIAGNOSIS — Z9889 Other specified postprocedural states: Secondary | ICD-10-CM

## 2019-01-25 DIAGNOSIS — M19042 Primary osteoarthritis, left hand: Secondary | ICD-10-CM

## 2019-01-25 DIAGNOSIS — Z79899 Other long term (current) drug therapy: Secondary | ICD-10-CM

## 2019-01-25 DIAGNOSIS — M5030 Other cervical disc degeneration, unspecified cervical region: Secondary | ICD-10-CM

## 2019-01-25 DIAGNOSIS — I2510 Atherosclerotic heart disease of native coronary artery without angina pectoris: Secondary | ICD-10-CM

## 2019-01-25 DIAGNOSIS — M67432 Ganglion, left wrist: Secondary | ICD-10-CM

## 2019-01-25 DIAGNOSIS — M75101 Unspecified rotator cuff tear or rupture of right shoulder, not specified as traumatic: Secondary | ICD-10-CM

## 2019-01-25 DIAGNOSIS — M19072 Primary osteoarthritis, left ankle and foot: Secondary | ICD-10-CM

## 2019-01-25 DIAGNOSIS — Z8639 Personal history of other endocrine, nutritional and metabolic disease: Secondary | ICD-10-CM

## 2019-01-25 DIAGNOSIS — E782 Mixed hyperlipidemia: Secondary | ICD-10-CM

## 2019-01-25 MED ORDER — XELJANZ 5 MG PO TABS: ORAL_TABLET | ORAL | 0 refills | Status: DC

## 2019-01-25 NOTE — Patient Instructions (Signed)
Shoulder Exercises Ask your health care provider which exercises are safe for you. Do exercises exactly as told by your health care provider and adjust them as directed. It is normal to feel mild stretching, pulling, tightness, or discomfort as you do these exercises. Stop right away if you feel sudden pain or your pain gets worse. Do not begin these exercises until told by your health care provider. Stretching exercises External rotation and abduction This exercise is sometimes called corner stretch. This exercise rotates your arm outward (external rotation) and moves your arm out from your body (abduction). 1. Stand in a doorway with one of your feet slightly in front of the other. This is called a staggered stance. If you cannot reach your forearms to the door frame, stand facing a corner of a room. 2. Choose one of the following positions as told by your health care provider: ? Place your hands and forearms on the door frame above your head. ? Place your hands and forearms on the door frame at the height of your head. ? Place your hands on the door frame at the height of your elbows. 3. Slowly move your weight onto your front foot until you feel a stretch across your chest and in the front of your shoulders. Keep your head and chest upright and keep your abdominal muscles tight. 4. Hold for __________ seconds. 5. To release the stretch, shift your weight to your back foot. Repeat __________ times. Complete this exercise __________ times a day. Extension, standing 1. Stand and hold a broomstick, a cane, or a similar object behind your back. ? Your hands should be a little wider than shoulder width apart. ? Your palms should face away from your back. 2. Keeping your elbows straight and your shoulder muscles relaxed, move the stick away from your body until you feel a stretch in your shoulders (extension). ? Avoid shrugging your shoulders while you move the stick. Keep your shoulder blades tucked  down toward the middle of your back. 3. Hold for __________ seconds. 4. Slowly return to the starting position. Repeat __________ times. Complete this exercise __________ times a day. Range-of-motion exercises Pendulum  1. Stand near a wall or a surface that you can hold onto for balance. 2. Bend at the waist and let your left / right arm hang straight down. Use your other arm to support you. Keep your back straight and do not lock your knees. 3. Relax your left / right arm and shoulder muscles, and move your hips and your trunk so your left / right arm swings freely. Your arm should swing because of the motion of your body, not because you are using your arm or shoulder muscles. 4. Keep moving your hips and trunk so your arm swings in the following directions, as told by your health care provider: ? Side to side. ? Forward and backward. ? In clockwise and counterclockwise circles. 5. Continue each motion for __________ seconds, or for as long as told by your health care provider. 6. Slowly return to the starting position. Repeat __________ times. Complete this exercise __________ times a day. Shoulder flexion, standing  1. Stand and hold a broomstick, a cane, or a similar object. Place your hands a little more than shoulder width apart on the object. Your left / right hand should be palm up, and your other hand should be palm down. 2. Keep your elbow straight and your shoulder muscles relaxed. Push the stick up with your healthy arm to   raise your left / right arm in front of your body, and then over your head until you feel a stretch in your shoulder (flexion). ? Avoid shrugging your shoulder while you raise your arm. Keep your shoulder blade tucked down toward the middle of your back. 3. Hold for __________ seconds. 4. Slowly return to the starting position. Repeat __________ times. Complete this exercise __________ times a day. Shoulder abduction, standing 1. Stand and hold a broomstick,  a cane, or a similar object. Place your hands a little more than shoulder width apart on the object. Your left / right hand should be palm up, and your other hand should be palm down. 2. Keep your elbow straight and your shoulder muscles relaxed. Push the object across your body toward your left / right side. Raise your left / right arm to the side of your body (abduction) until you feel a stretch in your shoulder. ? Do not raise your arm above shoulder height unless your health care provider tells you to do that. ? If directed, raise your arm over your head. ? Avoid shrugging your shoulder while you raise your arm. Keep your shoulder blade tucked down toward the middle of your back. 3. Hold for __________ seconds. 4. Slowly return to the starting position. Repeat __________ times. Complete this exercise __________ times a day. Internal rotation  1. Place your left / right hand behind your back, palm up. 2. Use your other hand to dangle an exercise band, a towel, or a similar object over your shoulder. Grasp the band with your left / right hand so you are holding on to both ends. 3. Gently pull up on the band until you feel a stretch in the front of your left / right shoulder. The movement of your arm toward the center of your body is called internal rotation. ? Avoid shrugging your shoulder while you raise your arm. Keep your shoulder blade tucked down toward the middle of your back. 4. Hold for __________ seconds. 5. Release the stretch by letting go of the band and lowering your hands. Repeat __________ times. Complete this exercise __________ times a day. Strengthening exercises External rotation  1. Sit in a stable chair without armrests. 2. Secure an exercise band to a stable object at elbow height on your left / right side. 3. Place a soft object, such as a folded towel or a small pillow, between your left / right upper arm and your body to move your elbow about 4 inches (10 cm) away  from your side. 4. Hold the end of the exercise band so it is tight and there is no slack. 5. Keeping your elbow pressed against the soft object, slowly move your forearm out, away from your abdomen (external rotation). Keep your body steady so only your forearm moves. 6. Hold for __________ seconds. 7. Slowly return to the starting position. Repeat __________ times. Complete this exercise __________ times a day. Shoulder abduction  1. Sit in a stable chair without armrests, or stand up. 2. Hold a __________ weight in your left / right hand, or hold an exercise band with both hands. 3. Start with your arms straight down and your left / right palm facing in, toward your body. 4. Slowly lift your left / right hand out to your side (abduction). Do not lift your hand above shoulder height unless your health care provider tells you that this is safe. ? Keep your arms straight. ? Avoid shrugging your shoulder while you   do this movement. Keep your shoulder blade tucked down toward the middle of your back. 5. Hold for __________ seconds. 6. Slowly lower your arm, and return to the starting position. Repeat __________ times. Complete this exercise __________ times a day. Shoulder extension 1. Sit in a stable chair without armrests, or stand up. 2. Secure an exercise band to a stable object in front of you so it is at shoulder height. 3. Hold one end of the exercise band in each hand. Your palms should face each other. 4. Straighten your elbows and lift your hands up to shoulder height. 5. Step back, away from the secured end of the exercise band, until the band is tight and there is no slack. 6. Squeeze your shoulder blades together as you pull your hands down to the sides of your thighs (extension). Stop when your hands are straight down by your sides. Do not let your hands go behind your body. 7. Hold for __________ seconds. 8. Slowly return to the starting position. Repeat __________ times.  Complete this exercise __________ times a day. Shoulder row 1. Sit in a stable chair without armrests, or stand up. 2. Secure an exercise band to a stable object in front of you so it is at waist height. 3. Hold one end of the exercise band in each hand. Position your palms so that your thumbs are facing the ceiling (neutral position). 4. Bend each of your elbows to a 90-degree angle (right angle) and keep your upper arms at your sides. 5. Step back until the band is tight and there is no slack. 6. Slowly pull your elbows back behind you. 7. Hold for __________ seconds. 8. Slowly return to the starting position. Repeat __________ times. Complete this exercise __________ times a day. Shoulder press-ups  1. Sit in a stable chair that has armrests. Sit upright, with your feet flat on the floor. 2. Put your hands on the armrests so your elbows are bent and your fingers are pointing forward. Your hands should be about even with the sides of your body. 3. Push down on the armrests and use your arms to lift yourself off the chair. Straighten your elbows and lift yourself up as much as you comfortably can. ? Move your shoulder blades down, and avoid letting your shoulders move up toward your ears. ? Keep your feet on the ground. As you get stronger, your feet should support less of your body weight as you lift yourself up. 4. Hold for __________ seconds. 5. Slowly lower yourself back into the chair. Repeat __________ times. Complete this exercise __________ times a day. Wall push-ups  1. Stand so you are facing a stable wall. Your feet should be about one arm-length away from the wall. 2. Lean forward and place your palms on the wall at shoulder height. 3. Keep your feet flat on the floor as you bend your elbows and lean forward toward the wall. 4. Hold for __________ seconds. 5. Straighten your elbows to push yourself back to the starting position. Repeat __________ times. Complete this exercise  __________ times a day. This information is not intended to replace advice given to you by your health care provider. Make sure you discuss any questions you have with your health care provider. Document Revised: 04/17/2018 Document Reviewed: 01/23/2018 Elsevier Patient Education  2020 [employer]  

## 2019-01-26 ENCOUNTER — Telehealth: Payer: Self-pay | Admitting: Pharmacy Technician

## 2019-01-26 NOTE — Telephone Encounter (Signed)
Received notification from [facility] regarding a prior authorization for [facility]. Authorization has been APPROVED from 01/26/19 to 01/07/20.   Will send document to scan center.  Authorization # [id number] Phone # [phone]

## 2019-01-26 NOTE — Telephone Encounter (Signed)
Submitted a Prior Authorization request to [facility] for [facility] via Cover My Meds. Will update once we receive a response.

## 2019-02-05 ENCOUNTER — Ambulatory Visit: Payer: Medicare Other | Admitting: Internal Medicine

## 2019-02-05 DIAGNOSIS — I10 Essential (primary) hypertension: Secondary | ICD-10-CM | POA: Diagnosis not present

## 2019-02-09 ENCOUNTER — Ambulatory Visit (INDEPENDENT_AMBULATORY_CARE_PROVIDER_SITE_OTHER): Payer: Medicare Other | Admitting: Internal Medicine

## 2019-02-09 ENCOUNTER — Other Ambulatory Visit: Payer: Self-pay

## 2019-02-09 ENCOUNTER — Encounter: Payer: Self-pay | Admitting: Internal Medicine

## 2019-02-09 DIAGNOSIS — J449 Chronic obstructive pulmonary disease, unspecified: Secondary | ICD-10-CM | POA: Diagnosis not present

## 2019-02-09 DIAGNOSIS — R0600 Dyspnea, unspecified: Secondary | ICD-10-CM | POA: Diagnosis not present

## 2019-02-09 DIAGNOSIS — R0609 Other forms of dyspnea: Secondary | ICD-10-CM

## 2019-02-09 NOTE — Patient Instructions (Addendum)
Target is 30 minutes of exercise dialy   - add resistance only after you build up to 30 min on bike    Please schedule a follow up visit in 6 months but call sooner if needed

## 2019-02-09 NOTE — Progress Notes (Signed)
Subjective:    Patient ID: [name], male   DOB: January 10, 1950    MRN: [id number]    Brief patient profile:  53 yobm quit smoking 2013 with dx of copd GOLD II with reversibility on prn inhalers didn't really use it much and "worse over the years" and placed on BREO but worse fall  2018 so placed on Trelegy and referred to pulmonary clinic 01/28/2017 by Dr   [name]      History of Present Illness  01/28/2017 1st [facility] office visit/ [name]  ? Copd   Chief Complaint  Patient presents with  . Pulmonary Consult    Referred by Dr. [name] for eval of COPD. Pt states that he has been having SOB for the past several yrs, worse for the past 3-6 months. He gets SOB with walking short distances and up and down stairs. He has CP that comes and goes. He also has some cough with white sputum.  He is using proair once daily on average.   doe x years much worse x sev months, cp is not related to breathing or exertion localized L ant chest/ lasts just a few secs Cough is worse since sob worse/ esp hs and early in am Baseline = 15 min walk outside mostly flat but struggles if gets in a hurry or steps  Thinks proair helps his cp but note resolves in 2 secs whether uses proair or not rec Continue the dexilant but for now take it Take 30- 60 min before your first and last meals of the day until cough and breathing better then just Take 30-60 min before first meal of the day  Stop Trelegy and lisinopril Losartan 50 mg only daily takes the place of lisinopril  Plan A = Automatic = symbicort 160 Take 2 puffs first thing in am and then another 2 puffs about 12 hours later.  Work on inhaler technique:   Plan B = Backup Only use your albuterol as a rescue medication        03/11/2017  f/u ov/[name] re: copd II/ brought meds  Chief Complaint  Patient presents with  . Follow-up    zpack helped with his cough. He occ produces some yellow sputum. He has not had to use his albuterol inhaler. He has some CP on  the left that comes and goes.   Dyspnea:  MMRC3 = can't walk 100 yards even at a slow pace at a flat grade s stopping due to sob   Cough: better p rx zpak Sleep: ok  SABA use:  No need for saba L cp  X years comes and goes / better p zpak "it was my nodules" - fleeting and no change with breathing or ex rec Keep your appointment for March 29 but call sooner if condition worsens  Change your cozar to where you take 50 mg x one pill twice daily for a total of 100mg  daily    04/04/2017  f/u ov/[name] re:  Copd GOLD II with marked reversibility on symb 160 2bid  Chief Complaint  Patient presents with  . Follow-up    Feels good but has chest pain that comes and goes.  Dyspnea:  MMRC2 = can't walk a nl pace on a flat grade s sob but does fine slow and flat eg Cough: no cough Sleep: most noct fine  SABA use:  Never  Cp same location as previous parasternal L upper lasts x no more than one-two secs 2-3 times a day  returned Jan 2019 vs many years prior to [place] same pattern attributed to "lung nodules'  rec Plan A = Automatic = Symbicort 160 Take 2 puffs first thing in am and then another 2 puffs about 12 hours later.  Work on inhaler technique:   Plan B = Backup Only use your albuterol as a rescue medication      11/04/2017  Acute extended ov/[name] re: cp/ sob  GOLD II copd  Chief Complaint  Patient presents with  . Acute Visit    Increased SOB and chest discomfort off and on x 2 wks. He is using his proair 1-2 x per wk.   Dyspnea:  Across parking lot x months Cough: no  Sleeping: better pain 2 pillows  SABA use: helps some  02: no   L Cp x years  comes and goes over seconds, better supine and always gone when wakes up in am rec Work on inhaler technique:  relax and gently blow all the way out then take a nice smooth deep breath back in, triggering the inhaler at same time you start breathing in.  Hold for up to 5 seconds if you can. Blow out thru nose. Rinse and gargle with water when  done Plan A = Automatic = symbicort 160 Take 2 puffs first thing in am and then another 2 puffs about 12 hours later.  Plan B = Backup Only use your albuterol as a rescue medication   Classic   pain pattern suggests ibs:      Please schedule a follow up office visit in 4 weeks, sooner if needed  with all medications /inhalers/ solutions in hand so we can verify exactly what you are taking. This includes all medications from all doctors and over the counters - PFTs on return  - note did not go to lab as req    04/08/18 pc Called and spoke with pt who stated he has had a cough with gray mucus x2-3 days. Pt stated he had a cold with clear postnasal drainage. Pt denies any fever. Pt has SOB which he said is due to the COPD and also states he has some tightness in chest and pain on left side. rec Prednisone 10 mg take  4 each am x 2 days,   2 each am x 2 days,  1 each am x 2 days and stop  zpak      05/20/2018 acute extended  ov/[name] re: copd/ recurrent cp x one month/ did not bring meds as requested   Chief Complaint  Patient presents with  . Acute Visit    Pt c/o left side CP and increased SOB x 1 month. He has had muscle aches for the past 6 wks.   Dyspnea:  Walks around park x 15 min daily never tries inhaler before walks, sob stops him and legs hurt but no cp with exertion  Cough: no Sleeping: on side bed is flat two pillows SABA use: bid avg  02: none  Cp comes and goes x years 2-3 min, always in same place / never supine/ did not follow IBS recs rec Plan A = Automatic = symbicort 160 Take 2 puffs first thing in am and then another 2 puffs about 12 hours later.  Add spiriva 2 pffs each am only  Plan B = Backup Only use your albuterol as a rescue medication  Classic   pain pattern suggests ibs rx citrucel/diet    Please schedule a follow up office visit in  6 weeks, sooner if needed  with all medications /inhalers/ solutions in hand so we can verify exactly what you are taking. This  includes all medications from all doctors and over the counters   02/09/2019  f/u ov/[name] re:  GOLD II/ brought meds ./ symb/spiriva maint  Chief Complaint  Patient presents with  . Follow-up    Breathing is about the same. He rarely uses his albuterol inhaler. No new co's.    Dyspnea:  MMRC2 = can't walk a nl pace on a flat grade s sob but does fine slow and flat / does stationery bike stops p 5 minutes sets resistance high  Cough: no Sleeping: ok SABA use: rare 02: none    No obvious day to day or daytime variability or assoc excess/ purulent sputum or mucus plugs or hemoptysis or cp or chest tightness, subjective wheeze or overt sinus or hb symptoms.   Sleeping flat  without nocturnal  or early am exacerbation  of respiratory  c/o's or need for noct saba. Also denies any obvious fluctuation of symptoms with weather or environmental changes or other aggravating or alleviating factors except as outlined above   No unusual exposure hx or h/o childhood pna/ asthma or knowledge of premature birth.  Current Allergies, Complete Past Medical History, Past Surgical History, Family History, and Social History were reviewed in [employer] record.  ROS  The following are not active complaints unless bolded Hoarseness, sore throat, dysphagia, dental problems, itching, sneezing,  nasal congestion or discharge of excess mucus or purulent secretions, ear ache,   fever, chills, sweats, unintended wt loss or wt gain, classically pleuritic or exertional cp,  orthopnea pnd or arm/hand swelling  or leg swelling, presyncope, palpitations, abdominal pain, anorexia, nausea, vomiting, diarrhea  or change in bowel habits or change in bladder habits, change in stools or change in urine, dysuria, hematuria,  rash, arthralgias, visual complaints, headache, numbness, weakness or ataxia or problems with walking or coordination,  change in mood or  memory.        Current Meds  Medication Sig  .  atorvastatin (LIPITOR) 20 MG tablet Take 20 mg by mouth daily.  . budesonide-formoterol (SYMBICORT) 160-4.5 MCG/ACT inhaler INHALE 2 PUFFS BY MOUTH TWICE A DAY RINSE MOUTH AFTER USE.  . celecoxib (CELEBREX) 200 MG capsule Take 1 capsule (200 mg total) by mouth daily. (HOLD X 5 DAYS WHILE TAKING THE IBUPROFEN) (Patient taking differently: Take 200 mg by mouth daily. )  . clopidogrel (PLAVIX) 75 MG tablet Take 1 tablet (75 mg total) by mouth daily.  Marland Kitchen Dexlansoprazole (DEXILANT) 30 MG capsule Take 30- 60 min before your first and last meals of the day (Patient taking differently: Take 30 mg by mouth daily. )  . docusate sodium (COLACE) 100 MG capsule Take 100 mg by mouth as needed.   Marland Kitchen glipiZIDE (GLUCOTROL) 5 MG tablet Take by mouth.  Marland Kitchen HYDROcodone-acetaminophen (NORCO) 7.5-325 MG per tablet Take 1 tablet by mouth 2 (two) times daily as needed for moderate pain.   Marland Kitchen JANUVIA 100 MG tablet Take 100 mg by mouth daily.   Marland Kitchen ketoconazole (NIZORAL) 2 % cream Apply to both feet once daily for 4 weeks  . losartan (COZAAR) 50 MG tablet Take 2 each am (Patient taking differently: Take 50 mg by mouth daily. Take 2 each am)  . metFORMIN (GLUCOPHAGE) 500 MG tablet Take 500 mg by mouth 2 (two) times daily with a meal.   . nitroGLYCERIN (NITROSTAT) 0.4 MG  SL tablet Place 1 tablet (0.4 mg total) under the tongue every 5 (five) minutes as needed for chest pain.  . ONE TOUCH ULTRA TEST test strip   . PROAIR HFA 108 (90 BASE) MCG/ACT inhaler Inhale 2 puffs into the lungs every 6 (six) hours as needed for wheezing or shortness of breath.   . Tiotropium Bromide Monohydrate (SPIRIVA RESPIMAT) 2.5 MCG/ACT AERS Inhale 2 puffs into the lungs daily.  Loura Pardon Salicylate (ASPERCREME EX) Apply topically as needed.  . vitamin C (ASCORBIC ACID) 500 MG tablet Take 500 mg by mouth daily.  . VOLTAREN 1 % GEL Apply 1 application topically daily as needed (pain).   Morrie Sheldon 5 MG TABS Take 1 tablet by mouth daily.                           Objective:   Physical Exam    amb anxious bm nad   02/09/2019         176  08/05/2018       172 05/20/2018       176 11/04/2017     180  04/04/2017       179  03/11/2017         180   02/13/2017        175   01/28/17 175 lb 12.8 oz (79.7 kg)  01/16/17 175 lb (79.4 kg)  11/01/16 170 lb (77.1 kg)   Vital signs reviewed  02/09/2019  - Note at rest 02 sats  98% on RA        HEENT : pt wearing mask not removed for exam due to covid -19 concerns.    NECK :  without JVD/Nodes/TM/ nl carotid upstrokes bilaterally   LUNGS: no acc muscle use,  Mod barrel  contour chest wall with bilateral  Distant bs s audible wheeze and  without cough on insp or exp maneuvers and mod  Hyperresonant  to  percussion bilaterally     CV:  RRR  no s3 or murmur or increase in P2, and no edema   ABD:  soft and nontender with pos mid insp Hoover's  in the supine position. No bruits or organomegaly appreciated, bowel sounds nl  MS:     ext warm without deformities, calf tenderness, cyanosis or clubbing No obvious joint restrictions   SKIN: warm and dry without lesions    NEURO:  alert, approp, nl sensorium with  no motor or cerebellar deficits apparent.                Assessment:

## 2019-02-10 ENCOUNTER — Encounter: Payer: Self-pay | Admitting: Internal Medicine

## 2019-02-10 NOTE — Assessment & Plan Note (Signed)
Advised:     To get the most out of exercise, you need to be continuously aware that you are short of breath, but never out of breath, for 30 minutes daily. As you improve, it will actually be easier for you to do the same amount of exercise  in  30 minutes so always push to the level where you are short of breath starting with slow pace and no resistance on stationary bike and building up from there.   Pt informed of the seriousness of COVID 19 infection as a direct risk to lung health  and safey and to close contacts and should continue to wear a facemask in public and minimize exposure to public locations but especially avoid any area or activity where non-close contacts are not observing distancing or wearing an appropriate face mask.  I strongly recommended vaccine when offered.    >>> f/u at 6 m          Each maintenance medication was reviewed in detail including emphasizing most importantly the difference between maintenance and prns and under what circumstances the prns are to be triggered using an action plan format where appropriate.  Total time for H and P, chart review, counseling,  and generating customized AVS unique to this office visit / charting =20 min

## 2019-02-10 NOTE — Assessment & Plan Note (Signed)
Quit smoking 2013 PFT's 01/04/15  FEV1 2.15 (71 % ) ratio 58  p 14 % improvement from saba p ? prior to study with DLCO  70 % corrects to 94  % for alv volume  With only mild curvature f/v loop  - Spirometry 01/28/2017  FEV1 1.59 (53%)  Ratio 59 off all rx  - 01/28/2017    symb 160 2bid and d/c acei and double the dexilant > marked improvement 02/13/2017  PFT's  04/04/2017  FEV1 1.99 (68 % ) ratio 62  p 23 % improvement from saba p nothing prior to study with DLCO  63 % corrects to 82  % for alv volume   - 04/04/2017  After extensive coaching inhaler device  effectiveness =    90%  - 11/04/2017  Walked RA x 3 laps @ 185 ft each stopped due to  End of study, fast pace, no  Desat, mild sob  - 11/04/2017  After extensive coaching inhaler device,  effectiveness =    75% > restart symb 160 2bid - Spirometry 11/04/2017  FEV1 1.4 (47%)  Ratio 58 off all rx     - 05/20/2018  After extensive coaching inhaler device,  effectiveness = 75% with symb and 90% with SMI > try add spiriva 2 pffs each am   - 05/20/2018   Walked RA  2 laps @  approx 219ft each @ fast pace  stopped due to  End of study, no cp or sob or desats     - The proper method of use, as well as anticipated side effects, of a metered-dose inhaler were discussed and demonstrated to the patient. Approach 90% using teach back    Group D in terms of symptom/risk and laba/lama/ICS  therefore appropriate rx at this point >>>  Continue symb 160/ spiriva 2.5   Pt informed of the seriousness of COVID 19 infection as a direct risk to lung health  and safey and to close contacts and should continue to wear a facemask in public and minimize exposure to public locations but especially avoid any area or activity where non-close contacts are not observing distancing or wearing an appropriate face mask.  I strongly recommended vaccine when offered.

## 2019-02-15 DIAGNOSIS — R251 Tremor, unspecified: Secondary | ICD-10-CM | POA: Insufficient documentation

## 2019-02-15 DIAGNOSIS — M47812 Spondylosis without myelopathy or radiculopathy, cervical region: Secondary | ICD-10-CM | POA: Diagnosis not present

## 2019-02-15 DIAGNOSIS — M5412 Radiculopathy, cervical region: Secondary | ICD-10-CM | POA: Diagnosis not present

## 2019-02-15 DIAGNOSIS — M961 Postlaminectomy syndrome, not elsewhere classified: Secondary | ICD-10-CM | POA: Diagnosis not present

## 2019-02-15 DIAGNOSIS — M4802 Spinal stenosis, cervical region: Secondary | ICD-10-CM | POA: Diagnosis not present

## 2019-02-17 ENCOUNTER — Telehealth: Payer: Self-pay | Admitting: Rheumatology

## 2019-02-17 DIAGNOSIS — Z299 Encounter for prophylactic measures, unspecified: Secondary | ICD-10-CM | POA: Diagnosis not present

## 2019-02-17 DIAGNOSIS — M25512 Pain in left shoulder: Secondary | ICD-10-CM | POA: Diagnosis not present

## 2019-02-17 DIAGNOSIS — J449 Chronic obstructive pulmonary disease, unspecified: Secondary | ICD-10-CM | POA: Diagnosis not present

## 2019-02-17 DIAGNOSIS — R21 Rash and other nonspecific skin eruption: Secondary | ICD-10-CM | POA: Diagnosis not present

## 2019-02-17 DIAGNOSIS — M25511 Pain in right shoulder: Secondary | ICD-10-CM | POA: Diagnosis not present

## 2019-02-17 DIAGNOSIS — I10 Essential (primary) hypertension: Secondary | ICD-10-CM | POA: Diagnosis not present

## 2019-02-17 NOTE — Telephone Encounter (Signed)
#  1.Patient would like to have someone call him with the results of shoulder x-rays he had last time he was here. He cannot understand doctor well, and does not remember what she told him about the results.  #2.  Patient also has stopped taking medication a couple days ago  that he was put on. He read the side effect that the medication could cause, and was very concerned. Now he waould like to know what to do. Please call to advise.

## 2019-02-17 NOTE — Telephone Encounter (Signed)
Called to address patient's concerns about Morrie Sheldon.  No answer.  Left voicemail stating I was calling to address medications concerns and requested a return call when I will be in the office tomorrow.   Mariella Saa, PharmD, [place], [place] Clinical Specialty Pharmacist [phone]  02/17/2019 3:53 PM

## 2019-02-17 NOTE — Telephone Encounter (Signed)
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   Patient has concerns and questions regarding [name]. Patient advised would have Amber our pharmacist reach out to discuss. Patient states he is heading to the doctor now. Please reach out this afternoon.

## 2019-02-18 NOTE — Telephone Encounter (Signed)
Returned patient's call.  Patient has concerns regarding the side effects of Xeljanz.  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  Reviewed patient's chart and did not find a history of positive TB.  Advised that we check for TB annually and his test have been negative.  Patient verbalized understanding.  Reviewed patient's chart and he has PAD.  I could not find any records of VTE.  Explained the difference between PAD and VTE.  Reviewed signs and symptoms of DVT and PE.  Patient verbalized understanding.  He is still complaining of shoulder pain.  Reviewed prior x-ray results and that Dr. Estanislado Pandy recommended shoulder exercises.  After our discussion patient is willing to resume Morrie Sheldon and will try shoulder exercises for shoulder pain.  He has a follow-up appointment scheduled for June.  Advised if his symptoms worsen or he develops any side effects to call and schedule an earlier appointment.  All questions encouraged and answered.  Instructed patient to call with any further questions or concerns.   Mariella Saa, PharmD, [place], [place] Clinical Specialty Pharmacist [phone]  02/18/2019 2:33 PM

## 2019-02-26 DIAGNOSIS — E119 Type 2 diabetes mellitus without complications: Secondary | ICD-10-CM | POA: Diagnosis not present

## 2019-03-07 DIAGNOSIS — I10 Essential (primary) hypertension: Secondary | ICD-10-CM | POA: Diagnosis not present

## 2019-03-10 DIAGNOSIS — I10 Essential (primary) hypertension: Secondary | ICD-10-CM | POA: Diagnosis not present

## 2019-03-10 DIAGNOSIS — E1142 Type 2 diabetes mellitus with diabetic polyneuropathy: Secondary | ICD-10-CM | POA: Diagnosis not present

## 2019-03-10 DIAGNOSIS — N183 Chronic kidney disease, stage 3 unspecified: Secondary | ICD-10-CM | POA: Diagnosis not present

## 2019-03-10 DIAGNOSIS — E1122 Type 2 diabetes mellitus with diabetic chronic kidney disease: Secondary | ICD-10-CM | POA: Diagnosis not present

## 2019-03-10 DIAGNOSIS — E1165 Type 2 diabetes mellitus with hyperglycemia: Secondary | ICD-10-CM | POA: Diagnosis not present

## 2019-03-10 DIAGNOSIS — Z299 Encounter for prophylactic measures, unspecified: Secondary | ICD-10-CM | POA: Diagnosis not present

## 2019-03-17 DIAGNOSIS — M25511 Pain in right shoulder: Secondary | ICD-10-CM | POA: Diagnosis not present

## 2019-03-17 DIAGNOSIS — Z87891 Personal history of nicotine dependence: Secondary | ICD-10-CM | POA: Diagnosis not present

## 2019-03-17 DIAGNOSIS — Z7902 Long term (current) use of antithrombotics/antiplatelets: Secondary | ICD-10-CM | POA: Diagnosis not present

## 2019-03-17 DIAGNOSIS — E119 Type 2 diabetes mellitus without complications: Secondary | ICD-10-CM | POA: Diagnosis not present

## 2019-03-17 DIAGNOSIS — M7551 Bursitis of right shoulder: Secondary | ICD-10-CM | POA: Diagnosis not present

## 2019-03-17 DIAGNOSIS — G8929 Other chronic pain: Secondary | ICD-10-CM | POA: Diagnosis not present

## 2019-03-17 DIAGNOSIS — R079 Chest pain, unspecified: Secondary | ICD-10-CM | POA: Diagnosis not present

## 2019-03-17 DIAGNOSIS — J449 Chronic obstructive pulmonary disease, unspecified: Secondary | ICD-10-CM | POA: Diagnosis not present

## 2019-03-17 DIAGNOSIS — Z7984 Long term (current) use of oral hypoglycemic drugs: Secondary | ICD-10-CM | POA: Diagnosis not present

## 2019-03-17 DIAGNOSIS — Z79899 Other long term (current) drug therapy: Secondary | ICD-10-CM | POA: Diagnosis not present

## 2019-03-17 DIAGNOSIS — I10 Essential (primary) hypertension: Secondary | ICD-10-CM | POA: Diagnosis not present

## 2019-03-17 DIAGNOSIS — M25512 Pain in left shoulder: Secondary | ICD-10-CM | POA: Diagnosis not present

## 2019-03-23 DIAGNOSIS — E119 Type 2 diabetes mellitus without complications: Secondary | ICD-10-CM | POA: Diagnosis not present

## 2019-03-25 DIAGNOSIS — I739 Peripheral vascular disease, unspecified: Secondary | ICD-10-CM | POA: Diagnosis not present

## 2019-03-25 DIAGNOSIS — I10 Essential (primary) hypertension: Secondary | ICD-10-CM | POA: Diagnosis not present

## 2019-03-25 DIAGNOSIS — E1165 Type 2 diabetes mellitus with hyperglycemia: Secondary | ICD-10-CM | POA: Diagnosis not present

## 2019-03-25 DIAGNOSIS — R0789 Other chest pain: Secondary | ICD-10-CM | POA: Diagnosis not present

## 2019-03-25 DIAGNOSIS — E1142 Type 2 diabetes mellitus with diabetic polyneuropathy: Secondary | ICD-10-CM | POA: Diagnosis not present

## 2019-03-25 DIAGNOSIS — Z299 Encounter for prophylactic measures, unspecified: Secondary | ICD-10-CM | POA: Diagnosis not present

## 2019-03-31 ENCOUNTER — Ambulatory Visit (INDEPENDENT_AMBULATORY_CARE_PROVIDER_SITE_OTHER): Payer: Medicare Other | Admitting: Podiatry

## 2019-03-31 ENCOUNTER — Encounter: Payer: Self-pay | Admitting: Podiatry

## 2019-03-31 ENCOUNTER — Other Ambulatory Visit: Payer: Self-pay

## 2019-03-31 VITALS — Temp 97.2°F

## 2019-03-31 DIAGNOSIS — E1159 Type 2 diabetes mellitus with other circulatory complications: Secondary | ICD-10-CM | POA: Diagnosis not present

## 2019-03-31 DIAGNOSIS — M79674 Pain in right toe(s): Secondary | ICD-10-CM

## 2019-03-31 DIAGNOSIS — B351 Tinea unguium: Secondary | ICD-10-CM

## 2019-03-31 DIAGNOSIS — L84 Corns and callosities: Secondary | ICD-10-CM

## 2019-03-31 DIAGNOSIS — M79675 Pain in left toe(s): Secondary | ICD-10-CM | POA: Diagnosis not present

## 2019-03-31 NOTE — Patient Instructions (Signed)
Diabetes Mellitus and Foot Care Foot care is an important part of your health, especially when you have diabetes. Diabetes may cause you to have problems because of poor blood flow (circulation) to your feet and legs, which can cause your skin to:  Become thinner and drier.  Break more easily.  Heal more slowly.  Peel and crack. You may also have nerve damage (neuropathy) in your legs and feet, causing decreased feeling in them. This means that you may not notice minor injuries to your feet that could lead to more serious problems. Noticing and addressing any potential problems early is the best way to prevent future foot problems. How to care for your feet Foot hygiene  Wash your feet daily with warm water and mild soap. Do not use hot water. Then, pat your feet and the areas between your toes until they are completely dry. Do not soak your feet as this can dry your skin.  Trim your toenails straight across. Do not dig under them or around the cuticle. File the edges of your nails with an emery board or nail file.  Apply a moisturizing lotion or petroleum jelly to the skin on your feet and to dry, brittle toenails. Use lotion that does not contain alcohol and is unscented. Do not apply lotion between your toes. Shoes and socks  Wear clean socks or stockings every day. Make sure they are not too tight. Do not wear knee-high stockings since they may decrease blood flow to your legs.  Wear shoes that fit properly and have enough cushioning. Always look in your shoes before you put them on to be sure there are no objects inside.  To break in new shoes, wear them for just a few hours a day. This prevents injuries on your feet. Wounds, scrapes, corns, and calluses  Check your feet daily for blisters, cuts, bruises, sores, and redness. If you cannot see the bottom of your feet, use a mirror or ask someone for help.  Do not cut corns or calluses or try to remove them with medicine.  If you  find a minor scrape, cut, or break in the skin on your feet, keep it and the skin around it clean and dry. You may clean these areas with mild soap and water. Do not clean the area with peroxide, alcohol, or iodine.  If you have a wound, scrape, corn, or callus on your foot, look at it several times a day to make sure it is healing and not infected. Check for: ? Redness, swelling, or pain. ? Fluid or blood. ? Warmth. ? Pus or a bad smell. General instructions  Do not cross your legs. This may decrease blood flow to your feet.  Do not use heating pads or hot water bottles on your feet. They may burn your skin. If you have lost feeling in your feet or legs, you may not know this is happening until it is too late.  Protect your feet from hot and cold by wearing shoes, such as at the beach or on hot pavement.  Schedule a complete foot exam at least once a year (annually) or more often if you have foot problems. If you have foot problems, report any cuts, sores, or bruises to your health care provider immediately. Contact a health care provider if:  You have a medical condition that increases your risk of infection and you have any cuts, sores, or bruises on your feet.  You have an injury that is not   healing.  You have redness on your legs or feet.  You feel burning or tingling in your legs or feet.  You have pain or cramps in your legs and feet.  Your legs or feet are numb.  Your feet always feel cold.  You have pain around a toenail. Get help right away if:  You have a wound, scrape, corn, or callus on your foot and: ? You have pain, swelling, or redness that gets worse. ? You have fluid or blood coming from the wound, scrape, corn, or callus. ? Your wound, scrape, corn, or callus feels warm to the touch. ? You have pus or a bad smell coming from the wound, scrape, corn, or callus. ? You have a fever. ? You have a red line going up your leg. Summary  Check your feet every day  for cuts, sores, red spots, swelling, and blisters.  Moisturize feet and legs daily.  Wear shoes that fit properly and have enough cushioning.  If you have foot problems, report any cuts, sores, or bruises to your health care provider immediately.  Schedule a complete foot exam at least once a year (annually) or more often if you have foot problems. This information is not intended to replace advice given to you by your health care provider. Make sure you discuss any questions you have with your health care provider. Document Revised: 09/16/2018 Document Reviewed: 01/26/2016 Elsevier Patient Education  2020 [employer]  

## 2019-03-31 NOTE — Progress Notes (Signed)
Subjective: [name] presents today for follow up of for at risk foot care. Patient has h/o PAD and callus(es) left hallux and painful mycotic toenails b/l that are difficult to trim. Pain interferes with ambulation. Aggravating factors include wearing enclosed shoe gear. Pain is relieved with periodic professional debridement.   He is followed by Vascular on a regular basis.  Patient states he went fishing yesterday and walked and stood up quite a bit. He relates pain in left calf after the activity. When asked how far he think he walked, he stated about one block. Denies any changes in color of digits/foot or leg.  No Known Allergies   Objective: Vitals:   03/31/19 0817  Temp: (!) 97.2 F (36.2 C)    Pt 69 y.o. year old AA  male WD, WN in NAD. AAO x 3.   Vascular Examination:  Capillary fill time to digits <3 seconds b/l. Nonpalpable DP pulse left foot; faintly palpable right foot. Nonpalpable PT pulses b/l. Pedal hair absent b/l Skin temperature gradient within normal limits b/l.  Dermatological Examination: Pedal skin with normal turgor, texture and tone bilaterally. No open wounds bilaterally. No interdigital macerations bilaterally. Toenails 1-5 b/l elongated, dystrophic, thickened, crumbly with subungual debris and tenderness to dorsal palpation. Hyperkeratotic lesion(s) left hallux IPJ.  No erythema, no edema, no drainage, no flocculence.  Musculoskeletal: Normal muscle strength 5/5 to all lower extremity muscle groups bilaterally, no pain crepitus or joint limitation noted with ROM b/l, bunion deformity noted b/l and patient ambulates independent of any assistive aids  Neurological: Protective sensation intact 5/5 intact bilaterally with 10g monofilament b/l Vibratory sensation intact b/l Proprioception intact bilaterally  Assessment: 1. Pain due to onychomycosis of toenails of both feet   2. Callus   3. Type 2 diabetes mellitus with vascular disease ([place])     Plan: -Continue diabetic foot care principles. Literature dispensed on today.  -Toenails 1-5 b/l were debrided in length and girth with sterile nail nippers and dremel without iatrogenic bleeding.  -Callus(es) left hallux were debrided without complication or incident. Total number debrided =1. -Patient to continue soft, supportive shoe gear daily. -Patient to report any pedal injuries to medical professional immediately. -Patient/POA to call should there be question/concern in the interim.  Return in about 3 months (around 07/01/2019) for diabetic nail trim.

## 2019-04-02 DIAGNOSIS — R079 Chest pain, unspecified: Secondary | ICD-10-CM | POA: Diagnosis not present

## 2019-04-02 DIAGNOSIS — I700 Atherosclerosis of aorta: Secondary | ICD-10-CM | POA: Diagnosis not present

## 2019-04-02 DIAGNOSIS — J439 Emphysema, unspecified: Secondary | ICD-10-CM | POA: Diagnosis not present

## 2019-04-06 DIAGNOSIS — I10 Essential (primary) hypertension: Secondary | ICD-10-CM | POA: Diagnosis not present

## 2019-04-06 DIAGNOSIS — E1165 Type 2 diabetes mellitus with hyperglycemia: Secondary | ICD-10-CM | POA: Diagnosis not present

## 2019-04-07 DIAGNOSIS — I700 Atherosclerosis of aorta: Secondary | ICD-10-CM | POA: Diagnosis not present

## 2019-04-07 DIAGNOSIS — I25119 Atherosclerotic heart disease of native coronary artery with unspecified angina pectoris: Secondary | ICD-10-CM | POA: Diagnosis not present

## 2019-04-07 DIAGNOSIS — I10 Essential (primary) hypertension: Secondary | ICD-10-CM | POA: Diagnosis not present

## 2019-04-07 DIAGNOSIS — E1165 Type 2 diabetes mellitus with hyperglycemia: Secondary | ICD-10-CM | POA: Diagnosis not present

## 2019-04-07 DIAGNOSIS — I739 Peripheral vascular disease, unspecified: Secondary | ICD-10-CM | POA: Diagnosis not present

## 2019-04-07 DIAGNOSIS — Z299 Encounter for prophylactic measures, unspecified: Secondary | ICD-10-CM | POA: Diagnosis not present

## 2019-04-13 ENCOUNTER — Ambulatory Visit (INDEPENDENT_AMBULATORY_CARE_PROVIDER_SITE_OTHER): Payer: Medicare Other | Admitting: Cardiology

## 2019-04-13 ENCOUNTER — Other Ambulatory Visit (HOSPITAL_COMMUNITY)
Admission: RE | Admit: 2019-04-13 | Discharge: 2019-04-13 | Disposition: A | Discharge status: Home / Self Care | Payer: Medicare Other | Source: Ambulatory Visit | Attending: Cardiovascular Disease | Admitting: Cardiovascular Disease

## 2019-04-13 ENCOUNTER — Telehealth: Payer: Self-pay | Admitting: Cardiology

## 2019-04-13 ENCOUNTER — Encounter: Payer: Self-pay | Admitting: Cardiology

## 2019-04-13 ENCOUNTER — Other Ambulatory Visit: Payer: Self-pay

## 2019-04-13 ENCOUNTER — Other Ambulatory Visit (HOSPITAL_COMMUNITY)
Admission: RE | Admit: 2019-04-13 | Discharge: 2019-04-13 | Disposition: A | Discharge status: Home / Self Care | Payer: Medicare Other | Source: Ambulatory Visit | Attending: Cardiology | Admitting: Cardiology

## 2019-04-13 ENCOUNTER — Encounter: Payer: Self-pay | Admitting: *Deleted

## 2019-04-13 VITALS — BP 140/78 | HR 92 | Ht 70.0 in | Wt 180.6 lb

## 2019-04-13 DIAGNOSIS — R0609 Other forms of dyspnea: Secondary | ICD-10-CM

## 2019-04-13 DIAGNOSIS — R079 Chest pain, unspecified: Secondary | ICD-10-CM | POA: Diagnosis not present

## 2019-04-13 DIAGNOSIS — Z9189 Other specified personal risk factors, not elsewhere classified: Secondary | ICD-10-CM

## 2019-04-13 DIAGNOSIS — I10 Essential (primary) hypertension: Secondary | ICD-10-CM

## 2019-04-13 DIAGNOSIS — Z20822 Contact with and (suspected) exposure to covid-19: Secondary | ICD-10-CM | POA: Diagnosis not present

## 2019-04-13 DIAGNOSIS — I739 Peripheral vascular disease, unspecified: Secondary | ICD-10-CM

## 2019-04-13 DIAGNOSIS — I2510 Atherosclerotic heart disease of native coronary artery without angina pectoris: Secondary | ICD-10-CM | POA: Diagnosis not present

## 2019-04-13 DIAGNOSIS — I25118 Atherosclerotic heart disease of native coronary artery with other forms of angina pectoris: Secondary | ICD-10-CM

## 2019-04-13 DIAGNOSIS — G8929 Other chronic pain: Secondary | ICD-10-CM

## 2019-04-13 DIAGNOSIS — E785 Hyperlipidemia, unspecified: Secondary | ICD-10-CM

## 2019-04-13 DIAGNOSIS — J449 Chronic obstructive pulmonary disease, unspecified: Secondary | ICD-10-CM

## 2019-04-13 DIAGNOSIS — Z01812 Encounter for preprocedural laboratory examination: Secondary | ICD-10-CM | POA: Diagnosis not present

## 2019-04-13 DIAGNOSIS — R0600 Dyspnea, unspecified: Secondary | ICD-10-CM

## 2019-04-13 DIAGNOSIS — M0609 Rheumatoid arthritis without rheumatoid factor, multiple sites: Secondary | ICD-10-CM

## 2019-04-13 LAB — BASIC METABOLIC PANEL
Anion gap: 8 (ref 5–15)
BUN: 14 mg/dL (ref 8–23)
CO2: 27 mmol/L (ref 22–32)
Calcium: 9.3 mg/dL (ref 8.9–10.3)
Chloride: 105 mmol/L (ref 98–111)
Creatinine, Ser: 0.88 mg/dL (ref 0.61–1.24)
GFR calc Af Amer: >60 mL/min (ref >60)
GFR calc non Af Amer: >60 mL/min (ref >60)
Glucose, Bld: 184 mg/dL — ABNORMAL HIGH (ref 70–99)
Potassium: 4.1 mmol/L (ref 3.5–5.1)
Sodium: 140 mmol/L (ref 135–145)

## 2019-04-13 LAB — CBC
HCT: 39.2 % (ref 39.0–52.0)
Hemoglobin: 12.3 g/dL — ABNORMAL LOW (ref 13.0–17.0)
MCH: 28.9 pg (ref 26.0–34.0)
MCHC: 31.4 g/dL (ref 30.0–36.0)
MCV: 92 fL (ref 80.0–100.0)
Platelets: 283 10*3/uL (ref 150–400)
RBC: 4.26 MIL/uL (ref 4.22–5.81)
RDW: 14.3 % (ref 11.5–15.5)
WBC: 5.8 10*3/uL (ref 4.0–10.5)
nRBC: 0 % (ref 0.0–0.2)

## 2019-04-13 MED ORDER — ASPIRIN EC 81 MG PO TBEC: 81.0000 mg | DELAYED_RELEASE_TABLET | Freq: Every day | ORAL | Status: DC

## 2019-04-13 MED ORDER — ISOSORBIDE MONONITRATE ER 30 MG PO TB24: 30.0000 mg | ORAL_TABLET | Freq: Every day | ORAL | 6 refills | Status: DC

## 2019-04-13 MED ORDER — NITROGLYCERIN 0.4 MG SL SUBL: 0.4000 mg | SUBLINGUAL_TABLET | SUBLINGUAL | 3 refills | Status: DC | PRN

## 2019-04-13 NOTE — Assessment & Plan Note (Signed)
LDL 84 Jan 2021- on statin Rx

## 2019-04-13 NOTE — Assessment & Plan Note (Signed)
Lt FPBPG '07 with re-do 2012 RCIA PTA '13 and Jan 2019 Lt LE claudication

## 2019-04-13 NOTE — Assessment & Plan Note (Signed)
Increasing DOE- r/o anginal equivalent

## 2019-04-13 NOTE — Patient Instructions (Addendum)
Medication Instructions:   Begin Imdur 30mg  daily - may start with 1/2 tab for a few days, then go up to the whole tab.  Begin Aspirin 81mg  daily.   Refilled Nitroglycerin - as needed for severe chest pain only.   Continue all other medications.    Labwork: BMET, CBC - orders given today.   Testing/Procedures: Your physician has requested that you have a cardiac catheterization. Cardiac catheterization is used to diagnose and/or treat various heart conditions. Doctors may recommend this procedure for a number of different reasons. The most common reason is to evaluate chest pain. Chest pain can be a symptom of coronary artery disease (CAD), and cardiac catheterization can show whether plaque is narrowing or blocking your heart's arteries. This procedure is also used to evaluate the valves, as well as measure the blood flow and oxygen levels in different parts of your heart. For further information please visit [email or web address]. Please follow instruction sheet, as given.  Follow-Up: 1 month   Any Other Special Instructions Will Be Listed Below (If Applicable).  If you need a refill on your cardiac medications before your next appointment, please call your pharmacy.

## 2019-04-13 NOTE — H&P (View-Only) (Signed)
Cardiology Office Note:    Date:  04/13/2019   ID:  [name], DOB Jul 20, 1950, MRN [id number]  PCP:  [name], MD  Cardiologist:  Rozann Lesches, MD  Electrophysiologist:  None   Referring MD: [name], MD   CC:  Chest pain and increasing DOE  History of Present Illness:    [name] is a 69 y.o. male with a hx of vascular disease, non-insulin-dependent diabetes, hypertension, treated dyslipidemia, COPD, rheumatoid arthritis, and DJD.  The patient has not previously had MI or coronary angiogram.  His last office visit with Dr. Domenic Polite was in March 2020.  He presents to the office today with complaints of chest pain and increasing dyspnea on exertion.  He describes his chest pain as sharp pain but he also gets pressure.  Previously he had been able to use an exercise bike and do some walking.  He notes that he has had increasing shortness of breath with these activities now.  He can't be sure how long he's had these symptoms but he does feel they are progressing.  His primary care provider ordered a chest CT 04/02/2019 which revealed severe atherosclerosis in the LAD and he was referred to [place] for further evaluation and catheterization.  The patient has not had rest symptoms and has not taken any medication to try and alleviate his symptoms.   Past Medical History:  Diagnosis Date  . Arthritis    RA  . Bronchitis   . Carotid artery occlusion   . Cervical spine disease    Neck surgery in 2007 with Dr. Joya Salm  . COPD (chronic obstructive pulmonary disease) ([place])   . Coronary atherosclerosis of native coronary artery    Nonobstrtuctive by catheterization 2007 and 2013  . DVT (deep venous thrombosis) ([place])   . Essential hypertension, benign   . GERD (gastroesophageal reflux disease)   . Hyperlipidemia   . PAD (peripheral artery disease) ([place])    s/p left fem-pop bypass 2007; PTA and stenting of right iliac artery 09/2011  . Rheumatoid arthritis(714.0)   . Type 2  diabetes mellitus ([place])     Past Surgical History:  Procedure Laterality Date  . ABDOMINAL AORTAGRAM N/A 10/03/2011   Procedure: ABDOMINAL Maxcine Ham;  Surgeon: Conrad Grandin, MD;  Location: [facility] CATH LAB;  Service: Cardiovascular;  Laterality: N/A;  . ABDOMINAL AORTOGRAM W/LOWER EXTREMITY N/A 01/16/2017   Procedure: ABDOMINAL AORTOGRAM W/LOWER EXTREMITY;  Surgeon: Conrad Boling, MD;  Location: [place] CV LAB;  Service: Cardiovascular;  Laterality: N/A;  . CERVICAL DISC SURGERY    . COLONOSCOPY WITH PROPOFOL N/A 02/17/2017   Procedure: COLONOSCOPY WITH PROPOFOL;  Surgeon: Daneil Dolin, MD;  Location: [facility];  Service: Endoscopy;  Laterality: N/A;  2:45pm  . FEMORAL-POPLITEAL BYPASS GRAFT  11/06/2005  . ILIAC ARTERY STENT  10-03-11   Right CIA stenting  . PERIPHERAL VASCULAR BALLOON ANGIOPLASTY Right 01/16/2017   Procedure: PERIPHERAL VASCULAR BALLOON ANGIOPLASTY;  Surgeon: Conrad , MD;  Location: [place] CV LAB;  Service: Cardiovascular;  Laterality: Right;  common iliac   . PR VEIN BYPASS GRAFT,AORTO-FEM-POP  06-22-2010   Redo Left Fem-Pop  . Repair of left arm fracture    . 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 SURGERY SCROTAL / TESTICULAR      Current Medications: Current Meds  Medication Sig  . atorvastatin (LIPITOR) 20 MG tablet Take 20 mg by mouth daily.  . budesonide-formoterol (SYMBICORT) 160-4.5 MCG/ACT inhaler INHALE 2 PUFFS BY  MOUTH TWICE A DAY RINSE MOUTH AFTER USE.  . celecoxib (CELEBREX) 200 MG capsule Take 1 capsule (200 mg total) by mouth daily. (HOLD X 5 DAYS WHILE TAKING THE IBUPROFEN) (Patient taking differently: Take 200 mg by mouth daily. )  . clopidogrel (PLAVIX) 75 MG tablet Take 1 tablet (75 mg total) by mouth daily.  Marland Kitchen Dexlansoprazole (DEXILANT) 30 MG capsule Take 30- 60 min before your first and last meals of the day (Patient taking differently: Take 30 mg by mouth daily. )  . diclofenac Sodium (VOLTAREN) 1 % GEL APPLY EXTERNALLY TO THE AFFECTED  AREA TWICE DAILY  . docusate sodium (COLACE) 100 MG capsule Take 100 mg by mouth as needed.   Marland Kitchen glipiZIDE (GLUCOTROL) 5 MG tablet Take 5 mg by mouth every morning.   Marland Kitchen HYDROcodone-acetaminophen (NORCO) 7.5-325 MG per tablet Take 1 tablet by mouth 2 (two) times daily as needed for moderate pain.   Marland Kitchen JANUVIA 100 MG tablet Take 100 mg by mouth daily.   Marland Kitchen ketoconazole (NIZORAL) 2 % cream Apply to both feet once daily for 4 weeks  . losartan (COZAAR) 50 MG tablet Take 2 each am (Patient taking differently: Take 50 mg by mouth daily. Take 2 each am)  . metFORMIN (GLUCOPHAGE) 500 MG tablet Take 500 mg by mouth 2 (two) times daily with a meal.   . nitroGLYCERIN (NITROSTAT) 0.4 MG SL tablet Place 1 tablet (0.4 mg total) under the tongue every 5 (five) minutes as needed for chest pain.  . ONE TOUCH ULTRA TEST test strip   . PROAIR HFA 108 (90 BASE) MCG/ACT inhaler Inhale 2 puffs into the lungs every 6 (six) hours as needed for wheezing or shortness of breath.   . Tiotropium Bromide Monohydrate (SPIRIVA RESPIMAT) 2.5 MCG/ACT AERS Inhale 2 puffs into the lungs daily.  Loura Pardon Salicylate (ASPERCREME EX) Apply topically as needed.  . vitamin C (ASCORBIC ACID) 500 MG tablet Take 500 mg by mouth daily.  Morrie Sheldon 5 MG TABS Take 1 tablet by mouth daily.     Allergies:   Patient has no known allergies.   Social History   Socioeconomic History  . Marital status: Single    Spouse name: Not on file  . Number of children: Not on file  . Years of education: Not on file  . Highest education level: Not on file  Occupational History  . Not on file  Tobacco Use  . Smoking status: Former Smoker    Packs/day: 1.00    Years: 45.00    Pack years: 45.00    Types: Cigarettes, Cigars    Quit date: 09/01/2011    Years since quitting: 7.6  . Smokeless tobacco: Never Used  Substance and Sexual Activity  . Alcohol use: No    Alcohol/week: 0.0 standard drinks  . Drug use: No  . Sexual activity: Not Currently     Birth control/protection: None  Other Topics Concern  . Not on file  Social History Narrative   The patient lives in [place].    Social Determinants of Health   Financial Resource Strain:   . Difficulty of Paying Living Expenses:   Food Insecurity:   . Worried About Charity fundraiser in the Last Year:   . Arboriculturist in the Last Year:   Transportation Needs:   . Film/video editor (Medical):   Marland Kitchen Lack of Transportation (Non-Medical):   Physical Activity:   . Days of Exercise per Week:   .  Minutes of Exercise per Session:   Stress:   . Feeling of Stress :   Social Connections:   . Frequency of Communication with Friends and Family:   . Frequency of Social Gatherings with Friends and Family:   . Attends Religious Services:   . Active Member of Clubs or Organizations:   . Attends Archivist Meetings:   Marland Kitchen Marital Status:      Family History: The patient's family history includes Cancer in his brother and son; Deep vein thrombosis in his brother and mother; Diabetes in his brother, father, mother, and sister; Heart attack (age of onset: 44) in his mother; Hyperlipidemia in his brother, father, mother, sister, and son; Hypertension in his brother, father, mother, sister, and son; Peripheral vascular disease in his brother, father, and mother; Stroke in his father. There is no history of Colon cancer.  ROS:   Please see the history of present illness.  He's had some pain in his Lt leg concerning for claudication.  Last dopplers were done in Nov 2020.     All other systems reviewed and are negative.  EKGs/Labs/Other Studies Reviewed:    The following studies were reviewed today: Outside chest CT 04/02/2019  EKG:  EKG is ordered today.  The ekg ordered today demonstrates NSR HR 88, TWI V4-V5  Recent Labs: 05/20/2018: Pro B Natriuretic peptide (BNP) 61.0; TSH 1.35 01/15/2019: ALT 23; BUN 12; Creatinine, Ser 0.99; Hemoglobin 12.9; Platelets 270; Potassium 4.0;  Sodium 141  Recent Lipid Panel    Component Value Date/Time   CHOL 163 01/15/2019 1620   TRIG 134 01/15/2019 1620   HDL 56 01/15/2019 1620   CHOLHDL 2.9 01/15/2019 1620   CHOLHDL 2.7 08/06/2018 1035   LDLCALC 84 01/15/2019 1620   LDLCALC 88 08/06/2018 1035    Physical Exam:    VS:  BP 140/78   Pulse 92   Ht 5\' 10"  (1.778 m)   Wt 180 lb 9.6 oz (81.9 kg)   SpO2 93%   BMI 25.91 kg/m     Wt Readings from Last 3 Encounters:  04/13/19 180 lb 9.6 oz (81.9 kg)  02/09/19 176 lb 6.4 oz (80 kg)  01/25/19 179 lb 3.2 oz (81.3 kg)     GEN:  Well nourished, well developed AA male, in no acute distress HEENT: Normal NECK: No JVD; No carotid bruits CARDIAC: RRR, decreased heart sounds, no murmurs, rubs, gallops RESPIRATORY:  Decreased breath sounds without rales, wheezing or rhonchi  ABDOMEN: Soft, non-tender, non-distended, no bruit MUSCULOSKELETAL:  No edema; No deformity, 2+ Rt DP pulse, absent Lt LE pulses. LFPBG scar with 2+ Lt FA pulse and bruit.  2+ Rt FA pulse with bruit.  2+ Rt radial pulse.   SKIN: Warm and dry NEUROLOGIC:  Alert and oriented x 3 PSYCHIATRIC:  Normal affect   ASSESSMENT:    Chronic chest pain with high risk for coronary artery disease "Severe" CAD on chest CT- pt c/o increasing DOE and chest pain.  His chest pain is somewhat atypical but with his known vascular disease, diabetes, and documented severe LAD atherosclerosis on CT scan I will arrange for an outpatient catheterization.  His daughter is an Therapist, sports at [employer].  I will try and arrange his study on a day she is working.  The patient understands that risks included but are not limited to stroke (1 in 1000), death (1 in 75), kidney failure [usually temporary] (1 in 500), bleeding (1 in 200), allergic reaction [possibly serious] (1  in 200).  The patient understands and agrees to proceed.   Coronary atherosclerosis of native coronary artery Chest CT 04/02/2019-"severe atherosclerosis in LAD region"-(Dr  Chase Picket)  PVD (peripheral vascular disease) ([place]) Lt FPBPG '07 with re-do 2012 RCIA PTA '13 and Jan 2019 Lt LE claudication  Essential hypertension Controlled  DOE (dyspnea on exertion) Increasing DOE- r/o anginal equivalent   COPD  GOLD II Followed by Dr Melvyn Novas  Dyslipidemia, goal LDL below 70 LDL 84 Jan 2021- on statin Rx  Rheumatoid arthritis ([place]) Followed by Dr Estanislado Pandy-   Non-insulin dependent type 2 diabetes mellitus ([place]) On Glucophage and Januvia-last SCr was 0.99 in Jan 2021  PLAN:    OP cath. Add Imdur 30 mg, ASA 81 mg, and SL NTG.  Hold Glucophage pre cath.    Medication Adjustments/Labs and Tests Ordered: Current medicines are reviewed at length with the patient today.  Concerns regarding medicines are outlined above.  Orders Placed This Encounter  Procedures  . EKG 12-Lead   No orders of the defined types were placed in this encounter.   Patient Instructions  Medication Instructions:   Begin Imdur 30mg  daily - may start with 1/2 tab for a few days, then go up to the whole tab.  Begin Aspirin 81mg  daily.   Begin Nitroglycerin as needed for severe chest pain only.   Continue all other medications.    Labwork: BMET, CBC - orders given today.   Testing/Procedures: Your physician has requested that you have a cardiac catheterization. Cardiac catheterization is used to diagnose and/or treat various heart conditions. Doctors may recommend this procedure for a number of different reasons. The most common reason is to evaluate chest pain. Chest pain can be a symptom of coronary artery disease (CAD), and cardiac catheterization can show whether plaque is narrowing or blocking your heart's arteries. This procedure is also used to evaluate the valves, as well as measure the blood flow and oxygen levels in different parts of your heart. For further information please visit [email or web address]. Please follow instruction sheet, as given.  Follow-Up: 1 month    Any Other Special Instructions Will Be Listed Below (If Applicable).  If you need a refill on your cardiac medications before your next appointment, please call your pharmacy.     Signed, [name], PA-C  04/13/2019 8:59 AM    [employer]

## 2019-04-13 NOTE — Assessment & Plan Note (Signed)
"  Severe" CAD on chest CT- pt c/o increasing DOE and chest pain

## 2019-04-13 NOTE — Telephone Encounter (Signed)
  Precert needed for:  Cath 04/07 11:30 am with Claiborne Billings

## 2019-04-13 NOTE — Assessment & Plan Note (Signed)
Chest CT 04/02/2019-"severe atherosclerosis in LAD region"-(Dr Chase Picket)

## 2019-04-13 NOTE — Assessment & Plan Note (Signed)
On Glucophage and Januvia-last SCr was 0.99 in Jan 2021

## 2019-04-13 NOTE — Assessment & Plan Note (Signed)
Controlled.  

## 2019-04-13 NOTE — Assessment & Plan Note (Signed)
Followed by Dr Wert 

## 2019-04-13 NOTE — Progress Notes (Signed)
Cardiology Office Note:    Date:  04/13/2019   ID:  [name], DOB December 17, 1950, MRN [id number]  PCP:  [name], MD  Cardiologist:  Rozann Lesches, MD  Electrophysiologist:  None   Referring MD: [name], MD   CC:  Chest pain and increasing DOE  History of Present Illness:    [name] is a 69 y.o. male with a hx of vascular disease, non-insulin-dependent diabetes, hypertension, treated dyslipidemia, COPD, rheumatoid arthritis, and DJD.  The patient has not previously had MI or coronary angiogram.  His last office visit with Dr. Domenic Polite was in March 2020.  He presents to the office today with complaints of chest pain and increasing dyspnea on exertion.  He describes his chest pain as sharp pain but he also gets pressure.  Previously he had been able to use an exercise bike and do some walking.  He notes that he has had increasing shortness of breath with these activities now.  He can't be sure how long he's had these symptoms but he does feel they are progressing.  His primary care provider ordered a chest CT 04/02/2019 which revealed severe atherosclerosis in the LAD and he was referred to [place] for further evaluation and catheterization.  The patient has not had rest symptoms and has not taken any medication to try and alleviate his symptoms.   Past Medical History:  Diagnosis Date  . Arthritis    RA  . Bronchitis   . Carotid artery occlusion   . Cervical spine disease    Neck surgery in 2007 with Dr. Joya Salm  . COPD (chronic obstructive pulmonary disease) ([place])   . Coronary atherosclerosis of native coronary artery    Nonobstrtuctive by catheterization 2007 and 2013  . DVT (deep venous thrombosis) ([place])   . Essential hypertension, benign   . GERD (gastroesophageal reflux disease)   . Hyperlipidemia   . PAD (peripheral artery disease) ([place])    s/p left fem-pop bypass 2007; PTA and stenting of right iliac artery 09/2011  . Rheumatoid arthritis(714.0)   . Type 2  diabetes mellitus ([place])     Past Surgical History:  Procedure Laterality Date  . ABDOMINAL AORTAGRAM N/A 10/03/2011   Procedure: ABDOMINAL Maxcine Ham;  Surgeon: Conrad Frytown, MD;  Location[facility] CATH LAB;  Service: Cardiovascular;  Laterality: N/A;  . ABDOMINAL AORTOGRAM W/LOWER EXTREMITY N/A 01/16/2017   Procedure: ABDOMINAL AORTOGRAM W/LOWER EXTREMITY;  Surgeon: Conrad Walcott, MD;  Location: [place] CV LAB;  Service: Cardiovascular;  Laterality: N/A;  . CERVICAL DISC SURGERY    . COLONOSCOPY WITH PROPOFOL N/A 02/17/2017   Procedure: COLONOSCOPY WITH PROPOFOL;  Surgeon: Daneil Dolin, MD;  Location: [facility];  Service: Endoscopy;  Laterality: N/A;  2:45pm  . FEMORAL-POPLITEAL BYPASS GRAFT  11/06/2005  . ILIAC ARTERY STENT  10-03-11   Right CIA stenting  . PERIPHERAL VASCULAR BALLOON ANGIOPLASTY Right 01/16/2017   Procedure: PERIPHERAL VASCULAR BALLOON ANGIOPLASTY;  Surgeon: Conrad [place], MD;  Location: [place] CV LAB;  Service: Cardiovascular;  Laterality: Right;  common iliac   . PR VEIN BYPASS GRAFT,AORTO-FEM-POP  06-22-2010   Redo Left Fem-Pop  . Repair of left arm fracture    . 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 SURGERY SCROTAL / TESTICULAR      Current Medications: Current Meds  Medication Sig  . atorvastatin (LIPITOR) 20 MG tablet Take 20 mg by mouth daily.  . budesonide-formoterol (SYMBICORT) 160-4.5 MCG/ACT inhaler INHALE 2 PUFFS BY  MOUTH TWICE A DAY RINSE MOUTH AFTER USE.  . celecoxib (CELEBREX) 200 MG capsule Take 1 capsule (200 mg total) by mouth daily. (HOLD X 5 DAYS WHILE TAKING THE IBUPROFEN) (Patient taking differently: Take 200 mg by mouth daily. )  . clopidogrel (PLAVIX) 75 MG tablet Take 1 tablet (75 mg total) by mouth daily.  Marland Kitchen Dexlansoprazole (DEXILANT) 30 MG capsule Take 30- 60 min before your first and last meals of the day (Patient taking differently: Take 30 mg by mouth daily. )  . diclofenac Sodium (VOLTAREN) 1 % GEL APPLY EXTERNALLY TO THE AFFECTED  AREA TWICE DAILY  . docusate sodium (COLACE) 100 MG capsule Take 100 mg by mouth as needed.   Marland Kitchen glipiZIDE (GLUCOTROL) 5 MG tablet Take 5 mg by mouth every morning.   Marland Kitchen HYDROcodone-acetaminophen (NORCO) 7.5-325 MG per tablet Take 1 tablet by mouth 2 (two) times daily as needed for moderate pain.   Marland Kitchen JANUVIA 100 MG tablet Take 100 mg by mouth daily.   Marland Kitchen ketoconazole (NIZORAL) 2 % cream Apply to both feet once daily for 4 weeks  . losartan (COZAAR) 50 MG tablet Take 2 each am (Patient taking differently: Take 50 mg by mouth daily. Take 2 each am)  . metFORMIN (GLUCOPHAGE) 500 MG tablet Take 500 mg by mouth 2 (two) times daily with a meal.   . nitroGLYCERIN (NITROSTAT) 0.4 MG SL tablet Place 1 tablet (0.4 mg total) under the tongue every 5 (five) minutes as needed for chest pain.  . ONE TOUCH ULTRA TEST test strip   . PROAIR HFA 108 (90 BASE) MCG/ACT inhaler Inhale 2 puffs into the lungs every 6 (six) hours as needed for wheezing or shortness of breath.   . Tiotropium Bromide Monohydrate (SPIRIVA RESPIMAT) 2.5 MCG/ACT AERS Inhale 2 puffs into the lungs daily.  Loura Pardon Salicylate (ASPERCREME EX) Apply topically as needed.  . vitamin C (ASCORBIC ACID) 500 MG tablet Take 500 mg by mouth daily.  Morrie Sheldon 5 MG TABS Take 1 tablet by mouth daily.     Allergies:   Patient has no known allergies.   Social History   Socioeconomic History  . Marital status: Single    Spouse name: Not on file  . Number of children: Not on file  . Years of education: Not on file  . Highest education level: Not on file  Occupational History  . Not on file  Tobacco Use  . Smoking status: Former Smoker    Packs/day: 1.00    Years: 45.00    Pack years: 45.00    Types: Cigarettes, Cigars    Quit date: 09/01/2011    Years since quitting: 7.6  . Smokeless tobacco: Never Used  Substance and Sexual Activity  . Alcohol use: No    Alcohol/week: 0.0 standard drinks  . Drug use: No  . Sexual activity: Not Currently     Birth control/protection: None  Other Topics Concern  . Not on file  Social History Narrative   The patient lives in [place].    Social Determinants of Health   Financial Resource Strain:   . Difficulty of Paying Living Expenses:   Food Insecurity:   . Worried About Charity fundraiser in the Last Year:   . Arboriculturist in the Last Year:   Transportation Needs:   . Film/video editor (Medical):   Marland Kitchen Lack of Transportation (Non-Medical):   Physical Activity:   . Days of Exercise per Week:   .  Minutes of Exercise per Session:   Stress:   . Feeling of Stress :   Social Connections:   . Frequency of Communication with Friends and Family:   . Frequency of Social Gatherings with Friends and Family:   . Attends Religious Services:   . Active Member of Clubs or Organizations:   . Attends Archivist Meetings:   Marland Kitchen Marital Status:      Family History: The patient's family history includes Cancer in his brother and son; Deep vein thrombosis in his brother and mother; Diabetes in his brother, father, mother, and sister; Heart attack (age of onset: 77) in his mother; Hyperlipidemia in his brother, father, mother, sister, and son; Hypertension in his brother, father, mother, sister, and son; Peripheral vascular disease in his brother, father, and mother; Stroke in his father. There is no history of Colon cancer.  ROS:   Please see the history of present illness.  He's had some pain in his Lt leg concerning for claudication.  Last dopplers were done in Nov 2020.     All other systems reviewed and are negative.  EKGs/Labs/Other Studies Reviewed:    The following studies were reviewed today: Outside chest CT 04/02/2019  EKG:  EKG is ordered today.  The ekg ordered today demonstrates NSR HR 88, TWI V4-V5  Recent Labs: 05/20/2018: Pro B Natriuretic peptide (BNP) 61.0; TSH 1.35 01/15/2019: ALT 23; BUN 12; Creatinine, Ser 0.99; Hemoglobin 12.9; Platelets 270; Potassium 4.0;  Sodium 141  Recent Lipid Panel    Component Value Date/Time   CHOL 163 01/15/2019 1620   TRIG 134 01/15/2019 1620   HDL 56 01/15/2019 1620   CHOLHDL 2.9 01/15/2019 1620   CHOLHDL 2.7 08/06/2018 1035   LDLCALC 84 01/15/2019 1620   LDLCALC 88 08/06/2018 1035    Physical Exam:    VS:  BP 140/78   Pulse 92   Ht 5\' 10"  (1.778 m)   Wt 180 lb 9.6 oz (81.9 kg)   SpO2 93%   BMI 25.91 kg/m     Wt Readings from Last 3 Encounters:  04/13/19 180 lb 9.6 oz (81.9 kg)  02/09/19 176 lb 6.4 oz (80 kg)  01/25/19 179 lb 3.2 oz (81.3 kg)     GEN:  Well nourished, well developed AA male, in no acute distress HEENT: Normal NECK: No JVD; No carotid bruits CARDIAC: RRR, decreased heart sounds, no murmurs, rubs, gallops RESPIRATORY:  Decreased breath sounds without rales, wheezing or rhonchi  ABDOMEN: Soft, non-tender, non-distended, no bruit MUSCULOSKELETAL:  No edema; No deformity, 2+ Rt DP pulse, absent Lt LE pulses. LFPBG scar with 2+ Lt FA pulse and bruit.  2+ Rt FA pulse with bruit.  2+ Rt radial pulse.   SKIN: Warm and dry NEUROLOGIC:  Alert and oriented x 3 PSYCHIATRIC:  Normal affect   ASSESSMENT:    Chronic chest pain with high risk for coronary artery disease "Severe" CAD on chest CT- pt c/o increasing DOE and chest pain.  His chest pain is somewhat atypical but with his known vascular disease, diabetes, and documented severe LAD atherosclerosis on CT scan I will arrange for an outpatient catheterization.  His daughter is an Therapist, sports at [employer].  I will try and arrange his study on a day she is working.  The patient understands that risks included but are not limited to stroke (1 in 1000), death (1 in 17), kidney failure [usually temporary] (1 in 500), bleeding (1 in 200), allergic reaction [possibly serious] (1  in 200).  The patient understands and agrees to proceed.   Coronary atherosclerosis of native coronary artery Chest CT 04/02/2019-"severe atherosclerosis in LAD region"-(Dr  Chase Picket)  PVD (peripheral vascular disease) ([place]) Lt FPBPG '07 with re-do 2012 RCIA PTA '13 and Jan 2019 Lt LE claudication  Essential hypertension Controlled  DOE (dyspnea on exertion) Increasing DOE- r/o anginal equivalent   COPD  GOLD II Followed by Dr Melvyn Novas  Dyslipidemia, goal LDL below 70 LDL 84 Jan 2021- on statin Rx  Rheumatoid arthritis ([place]) Followed by Dr Estanislado Pandy-   Non-insulin dependent type 2 diabetes mellitus ([place]) On Glucophage and Januvia-last SCr was 0.99 in Jan 2021  PLAN:    OP cath. Add Imdur 30 mg, ASA 81 mg, and SL NTG.  Hold Glucophage pre cath.    Medication Adjustments/Labs and Tests Ordered: Current medicines are reviewed at length with the patient today.  Concerns regarding medicines are outlined above.  Orders Placed This Encounter  Procedures  . EKG 12-Lead   No orders of the defined types were placed in this encounter.   Patient Instructions  Medication Instructions:   Begin Imdur 30mg  daily - may start with 1/2 tab for a few days, then go up to the whole tab.  Begin Aspirin 81mg  daily.   Begin Nitroglycerin as needed for severe chest pain only.   Continue all other medications.    Labwork: BMET, CBC - orders given today.   Testing/Procedures: Your physician has requested that you have a cardiac catheterization. Cardiac catheterization is used to diagnose and/or treat various heart conditions. Doctors may recommend this procedure for a number of different reasons. The most common reason is to evaluate chest pain. Chest pain can be a symptom of coronary artery disease (CAD), and cardiac catheterization can show whether plaque is narrowing or blocking your heart's arteries. This procedure is also used to evaluate the valves, as well as measure the blood flow and oxygen levels in different parts of your heart. For further information please visit [facility]iest[facility]tn. Please follow instruction sheet, as given.  Follow-Up: 1 month    Any Other Special Instructions Will Be Listed Below (If Applicable).  If you need a refill on your cardiac medications before your next appointment, please call your pharmacy.     Signed, [name], PA-C  04/13/2019 8:59 AM    [place]

## 2019-04-13 NOTE — Assessment & Plan Note (Signed)
Followed by Dr Deveshwar 

## 2019-04-14 ENCOUNTER — Other Ambulatory Visit: Payer: Self-pay

## 2019-04-14 ENCOUNTER — Ambulatory Visit (HOSPITAL_COMMUNITY)
Admission: RE | Disposition: A | Discharge status: Home / Self Care | Payer: Medicare Other | Source: Home / Self Care | Attending: Cardiovascular Disease

## 2019-04-14 ENCOUNTER — Ambulatory Visit (HOSPITAL_COMMUNITY)
Admission: RE | Admit: 2019-04-14 | Discharge: 2019-04-14 | Disposition: A | Discharge status: Home / Self Care | Payer: Medicare Other | Attending: Cardiovascular Disease | Admitting: Cardiovascular Disease

## 2019-04-14 DIAGNOSIS — M069 Rheumatoid arthritis, unspecified: Secondary | ICD-10-CM | POA: Diagnosis not present

## 2019-04-14 DIAGNOSIS — R079 Chest pain, unspecified: Secondary | ICD-10-CM | POA: Diagnosis present

## 2019-04-14 DIAGNOSIS — Z8249 Family history of ischemic heart disease and other diseases of the circulatory system: Secondary | ICD-10-CM | POA: Insufficient documentation

## 2019-04-14 DIAGNOSIS — I2510 Atherosclerotic heart disease of native coronary artery without angina pectoris: Secondary | ICD-10-CM | POA: Diagnosis not present

## 2019-04-14 DIAGNOSIS — Z87891 Personal history of nicotine dependence: Secondary | ICD-10-CM | POA: Insufficient documentation

## 2019-04-14 DIAGNOSIS — I10 Essential (primary) hypertension: Secondary | ICD-10-CM | POA: Diagnosis not present

## 2019-04-14 DIAGNOSIS — E785 Hyperlipidemia, unspecified: Secondary | ICD-10-CM | POA: Insufficient documentation

## 2019-04-14 DIAGNOSIS — Z7951 Long term (current) use of inhaled steroids: Secondary | ICD-10-CM | POA: Insufficient documentation

## 2019-04-14 DIAGNOSIS — Z95828 Presence of other vascular implants and grafts: Secondary | ICD-10-CM | POA: Diagnosis not present

## 2019-04-14 DIAGNOSIS — Z7902 Long term (current) use of antithrombotics/antiplatelets: Secondary | ICD-10-CM | POA: Insufficient documentation

## 2019-04-14 DIAGNOSIS — Z7984 Long term (current) use of oral hypoglycemic drugs: Secondary | ICD-10-CM | POA: Diagnosis not present

## 2019-04-14 DIAGNOSIS — E1151 Type 2 diabetes mellitus with diabetic peripheral angiopathy without gangrene: Secondary | ICD-10-CM | POA: Insufficient documentation

## 2019-04-14 DIAGNOSIS — Z89021 Acquired absence of right finger(s): Secondary | ICD-10-CM | POA: Diagnosis not present

## 2019-04-14 DIAGNOSIS — K219 Gastro-esophageal reflux disease without esophagitis: Secondary | ICD-10-CM | POA: Insufficient documentation

## 2019-04-14 DIAGNOSIS — J449 Chronic obstructive pulmonary disease, unspecified: Secondary | ICD-10-CM | POA: Diagnosis not present

## 2019-04-14 DIAGNOSIS — Z86718 Personal history of other venous thrombosis and embolism: Secondary | ICD-10-CM | POA: Diagnosis not present

## 2019-04-14 DIAGNOSIS — I6529 Occlusion and stenosis of unspecified carotid artery: Secondary | ICD-10-CM | POA: Diagnosis not present

## 2019-04-14 DIAGNOSIS — Z79899 Other long term (current) drug therapy: Secondary | ICD-10-CM | POA: Diagnosis not present

## 2019-04-14 DIAGNOSIS — Z833 Family history of diabetes mellitus: Secondary | ICD-10-CM | POA: Diagnosis not present

## 2019-04-14 HISTORY — PX: LEFT HEART CATH AND CORONARY ANGIOGRAPHY: CATH118249

## 2019-04-14 LAB — GLUCOSE, CAPILLARY: Glucose-Capillary: 148 mg/dL — ABNORMAL HIGH (ref 70–99)

## 2019-04-14 LAB — SARS CORONAVIRUS 2 (TAT 6-24 HRS): SARS Coronavirus 2: NEGATIVE

## 2019-04-14 SURGERY — LEFT HEART CATH AND CORONARY ANGIOGRAPHY
Anesthesia: LOCAL

## 2019-04-14 MED ORDER — HEPARIN (PORCINE) IN NACL 1000-0.9 UT/500ML-% IV SOLN
INTRAVENOUS | Status: DC | PRN
  Administered 2019-04-14 (×3): 500 mL

## 2019-04-14 MED ORDER — LABETALOL HCL 5 MG/ML IV SOLN: 10.0000 mg | INTRAVENOUS | Status: DC | PRN

## 2019-04-14 MED ORDER — MIDAZOLAM HCL 2 MG/2ML IJ SOLN
INTRAMUSCULAR | Status: AC
  Filled 2019-04-14: qty 2

## 2019-04-14 MED ORDER — HEPARIN SODIUM (PORCINE) 1000 UNIT/ML IJ SOLN
INTRAMUSCULAR | Status: AC
  Filled 2019-04-14: qty 1

## 2019-04-14 MED ORDER — IOHEXOL 350 MG/ML SOLN
INTRAVENOUS | Status: DC | PRN
  Administered 2019-04-14: 50 mL via INTRA_ARTERIAL

## 2019-04-14 MED ORDER — SODIUM CHLORIDE 0.9% FLUSH: 3.0000 mL | Freq: Two times a day (BID) | INTRAVENOUS | Status: DC

## 2019-04-14 MED ORDER — LIDOCAINE HCL (PF) 1 % IJ SOLN
INTRAMUSCULAR | Status: AC
  Filled 2019-04-14: qty 30

## 2019-04-14 MED ORDER — ASPIRIN 81 MG PO CHEW: 81.0000 mg | CHEWABLE_TABLET | ORAL | Status: DC

## 2019-04-14 MED ORDER — ASPIRIN 81 MG PO CHEW: 81.0000 mg | CHEWABLE_TABLET | Freq: Every day | ORAL | Status: DC

## 2019-04-14 MED ORDER — ACETAMINOPHEN 325 MG PO TABS: 650.0000 mg | ORAL_TABLET | ORAL | Status: DC | PRN

## 2019-04-14 MED ORDER — MIDAZOLAM HCL 2 MG/2ML IJ SOLN
INTRAMUSCULAR | Status: DC | PRN
  Administered 2019-04-14: 2 mg via INTRAVENOUS

## 2019-04-14 MED ORDER — ATORVASTATIN CALCIUM 40 MG PO TABS
40.0000 mg | ORAL_TABLET | Freq: Every day | ORAL | Status: DC
  Filled 2019-04-14: qty 1

## 2019-04-14 MED ORDER — SODIUM CHLORIDE 0.9% FLUSH: 3.0000 mL | INTRAVENOUS | Status: DC | PRN

## 2019-04-14 MED ORDER — SODIUM CHLORIDE 0.9 % WEIGHT BASED INFUSION
3.0000 mL/kg/h | INTRAVENOUS | Status: AC
  Administered 2019-04-14: 3 mL/kg/h via INTRAVENOUS

## 2019-04-14 MED ORDER — VERAPAMIL HCL 2.5 MG/ML IV SOLN
INTRAVENOUS | Status: AC
  Filled 2019-04-14: qty 2

## 2019-04-14 MED ORDER — ONDANSETRON HCL 4 MG/2ML IJ SOLN: 4.0000 mg | Freq: Four times a day (QID) | INTRAMUSCULAR | Status: DC | PRN

## 2019-04-14 MED ORDER — SODIUM CHLORIDE 0.9 % IV SOLN: 250.0000 mL | INTRAVENOUS | Status: DC | PRN

## 2019-04-14 MED ORDER — HEPARIN SODIUM (PORCINE) 1000 UNIT/ML IJ SOLN
INTRAMUSCULAR | Status: DC | PRN
  Administered 2019-04-14: 4000 [IU] via INTRAVENOUS

## 2019-04-14 MED ORDER — HEPARIN (PORCINE) IN NACL 1000-0.9 UT/500ML-% IV SOLN
INTRAVENOUS | Status: AC
  Filled 2019-04-14: qty 1500

## 2019-04-14 MED ORDER — HYDRALAZINE HCL 20 MG/ML IJ SOLN: 10.0000 mg | INTRAMUSCULAR | Status: DC | PRN

## 2019-04-14 MED ORDER — FENTANYL CITRATE (PF) 100 MCG/2ML IJ SOLN
INTRAMUSCULAR | Status: AC
  Filled 2019-04-14: qty 2

## 2019-04-14 MED ORDER — SODIUM CHLORIDE 0.9 % WEIGHT BASED INFUSION
1.0000 mL/kg/h | INTRAVENOUS | Status: DC
  Administered 2019-04-14: 1 mL/kg/h via INTRAVENOUS

## 2019-04-14 MED ORDER — FENTANYL CITRATE (PF) 100 MCG/2ML IJ SOLN
INTRAMUSCULAR | Status: DC | PRN
  Administered 2019-04-14: 50 ug via INTRAVENOUS

## 2019-04-14 MED ORDER — SODIUM CHLORIDE 0.9 % IV SOLN: INTRAVENOUS | Status: DC

## 2019-04-14 SURGICAL SUPPLY — 11 items
CATH INFINITI JR4 5F (CATHETERS) ×1 IMPLANT
CATH OPTITORQUE TIG 4.0 5F (CATHETERS) ×1 IMPLANT
DEVICE RAD COMP TR BAND LRG (VASCULAR PRODUCTS) ×1 IMPLANT
GLIDESHEATH SLEND SS 6F .021 (SHEATH) ×1 IMPLANT
GUIDEWIRE INQWIRE 1.5J.035X260 (WIRE) IMPLANT
INQWIRE 1.5J .035X260CM (WIRE) ×4
KIT HEART LEFT (KITS) ×2 IMPLANT
PACK CARDIAC CATHETERIZATION (CUSTOM PROCEDURE TRAY) ×2 IMPLANT
SHEATH PROBE COVER 6X72 (BAG) ×1 IMPLANT
TRANSDUCER W/STOPCOCK (MISCELLANEOUS) ×2 IMPLANT
TUBING CIL FLEX 10 FLL-RA (TUBING) ×2 IMPLANT

## 2019-04-14 NOTE — Discharge Instructions (Signed)
Radial Site Care  This sheet gives you information about how to care for yourself after your procedure. Your health care provider may also give you more specific instructions. If you have problems or questions, contact your health care provider. What can I expect after the procedure? After the procedure, it is common to have:  Bruising and tenderness at the catheter insertion area. Follow these instructions at home: Medicines  Take over-the-counter and prescription medicines only as told by your health care provider. Insertion site care  Follow instructions from your health care provider about how to take care of your insertion site. Make sure you: ? Wash your hands with soap and water before you change your bandage (dressing). If soap and water are not available, use hand sanitizer. ? Change your dressing as told by your health care provider. ? Leave stitches (sutures), skin glue, or adhesive strips in place. These skin closures may need to stay in place for 2 weeks or longer. If adhesive strip edges start to loosen and curl up, you may trim the loose edges. Do not remove adhesive strips completely unless your health care provider tells you to do that.  Check your insertion site every day for signs of infection. Check for: ? Redness, swelling, or pain. ? Fluid or blood. ? Pus or a bad smell. ? Warmth.  Do not take baths, swim, or use a hot tub until your health care provider approves.  You may shower 24-48 hours after the procedure, or as directed by your health care provider. ? Remove the dressing and gently wash the site with plain soap and water. ? Pat the area dry with a clean towel. ? Do not rub the site. That could cause bleeding.  Do not apply powder or lotion to the site. Activity   For 24 hours after the procedure, or as directed by your health care provider: ? Do not flex or bend the affected arm. ? Do not push or pull heavy objects with the affected arm. ? Do not  drive yourself home from the hospital or clinic. You may drive 24 hours after the procedure unless your health care provider tells you not to. ? Do not operate machinery or power tools.  Do not lift anything that is heavier than 10 lb (4.5 kg), or the limit that you are told, until your health care provider says that it is safe.  Ask your health care provider when it is okay to: ? Return to work or school. ? Resume usual physical activities or sports. ? Resume sexual activity. General instructions  If the catheter site starts to bleed, raise your arm and put firm pressure on the site. If the bleeding does not stop, get help right away. This is a medical emergency.  If you went home on the same day as your procedure, a responsible adult should be with you for the first 24 hours after you arrive home.  Keep all follow-up visits as told by your health care provider. This is important. Contact a health care provider if:  You have a fever.  You have redness, swelling, or yellow drainage around your insertion site. Get help right away if:  You have unusual pain at the radial site.  The catheter insertion area swells very fast.  The insertion area is bleeding, and the bleeding does not stop when you hold steady pressure on the area.  Your arm or hand becomes pale, cool, tingly, or numb. These symptoms may represent a serious problem   that is an emergency. Do not wait to see if the symptoms will go away. Get medical help right away. Call your local emergency services (911 in the [place] Do not drive yourself to the hospital. Summary  After the procedure, it is common to have bruising and tenderness at the site.  Follow instructions from your health care provider about how to take care of your radial site wound. Check the wound every day for signs of infection.  Do not lift anything that is heavier than 10 lb (4.5 kg), or the limit that you are told, until your health care provider says  that it is safe. This information is not intended to replace advice given to you by your health care provider. Make sure you discuss any questions you have with your health care provider. Document Revised: 01/29/2017 Document Reviewed: 01/29/2017 Elsevier Patient Education  2020 [employer]vier Inc[employer]  

## 2019-04-14 NOTE — Interval H&P Note (Signed)
Cath Lab Visit (complete for each Cath Lab visit)  Clinical Evaluation Leading to the Procedure:   ACS: No.  Non-ACS:    Anginal Classification: CCS III  Anti-ischemic medical therapy: Minimal Therapy (1 class of medications)  Non-Invasive Test Results: No non-invasive testing performed  Prior CABG: No previous CABG      History and Physical Interval Note:  04/14/2019 1:31 PM  [name]  has presented today for surgery, with the diagnosis of chest pain.  The various methods of treatment have been discussed with the patient and family. After consideration of risks, benefits and other options for treatment, the patient has consented to  Procedure(s): LEFT HEART CATH AND CORONARY ANGIOGRAPHY (N/A) as a surgical intervention.  The patient's history has been reviewed, patient examined, no change in status, stable for surgery.  I have reviewed the patient's chart and labs.  Questions were answered to the patient's satisfaction.     [name]

## 2019-04-15 MED FILL — Lidocaine HCl Local Preservative Free (PF) Inj 1%: INTRAMUSCULAR | Qty: 30 | Status: AC

## 2019-04-15 MED FILL — Verapamil HCl IV Soln 2.5 MG/ML: INTRAVENOUS | Qty: 2 | Status: AC

## 2019-04-19 ENCOUNTER — Other Ambulatory Visit: Payer: Self-pay | Admitting: *Deleted

## 2019-04-19 DIAGNOSIS — I739 Peripheral vascular disease, unspecified: Secondary | ICD-10-CM

## 2019-04-19 DIAGNOSIS — I779 Disorder of arteries and arterioles, unspecified: Secondary | ICD-10-CM

## 2019-04-19 DIAGNOSIS — Z95828 Presence of other vascular implants and grafts: Secondary | ICD-10-CM

## 2019-04-26 ENCOUNTER — Ambulatory Visit (HOSPITAL_COMMUNITY)
Admission: RE | Admit: 2019-04-26 | Discharge: 2019-04-26 | Disposition: A | Discharge status: Home / Self Care | Payer: Medicare Other | Source: Ambulatory Visit | Attending: Surgery | Admitting: Surgery

## 2019-04-26 ENCOUNTER — Other Ambulatory Visit: Payer: Self-pay

## 2019-04-26 ENCOUNTER — Ambulatory Visit (INDEPENDENT_AMBULATORY_CARE_PROVIDER_SITE_OTHER)
Admit: 2019-04-26 | Discharge: 2019-04-26 | Disposition: A | Discharge status: Home / Self Care | Payer: Medicare Other | Attending: Surgery | Admitting: Surgery

## 2019-04-26 DIAGNOSIS — Z95828 Presence of other vascular implants and grafts: Secondary | ICD-10-CM | POA: Insufficient documentation

## 2019-04-26 DIAGNOSIS — I779 Disorder of arteries and arterioles, unspecified: Secondary | ICD-10-CM | POA: Diagnosis not present

## 2019-04-26 DIAGNOSIS — I739 Peripheral vascular disease, unspecified: Secondary | ICD-10-CM | POA: Insufficient documentation

## 2019-04-27 ENCOUNTER — Ambulatory Visit (INDEPENDENT_AMBULATORY_CARE_PROVIDER_SITE_OTHER): Payer: Medicare Other | Admitting: Physician Assistant

## 2019-04-27 VITALS — BP 122/73 | HR 86 | Temp 97.5°F | Resp 16 | Ht 70.0 in | Wt 179.4 lb

## 2019-04-27 DIAGNOSIS — I779 Disorder of arteries and arterioles, unspecified: Secondary | ICD-10-CM | POA: Diagnosis not present

## 2019-04-27 NOTE — Progress Notes (Signed)
Office Note     CC:  follow up Requesting Provider:  [name], MD  HPI: [name] is a 69 y.o. (Nov 10, 1950) male who presents for follow up of peripheral vascular disease. He is s/p left common femoral to above knee popliteal bypass with propaten in 2007 by Dr. Kellie Simmering and then a redo left common femoral to above knee popliteal bypass with propaten in 2012. In addition he has undergone a subintimal angioplasty of his right common iliac artery in 2013 with a drug-coated balloon angioplasty in 2019 by Dr. Bridgett Larsson.  He was last seen in December 2020 by NP. At the time he had some superficial right lower extremity wounds which were healing with topical antibiotic treatment. These have since remained healed with no recurrence  Today he presents with bilateral lower extremity pain which he says has been present for several years unchanged. It is hard to tell if this is claudication pain. He says it happens intermittently in the posterior right knee, and then left calf both at rest and on ambulation. Both sharp pain and cramping pains. No pain that keeps him awake at night. No pain in feet. No non healing wounds. He says he has decreased his exercise both walking and on stationary bike secondary to increased shortness of breath. He states that he was started on new medication that " is suppose to help with the blood flow in my veins" and he says he feels that that is helping his legs. He is unsure of the name of the new medication  He had presented for cardiac follow up recently for chest pain and increasing shortness of breath worsened on exertion. He has hx of COPD and CAD. He ended up having a cardiac catheterization on 04/14/19. Found to have Mild non-obstructive CAD with 20 and 30% irregularities in the proximal to mid LAD; normal ramus intermediate and left circumflex coronary arteries; and RCA with smooth 30% proximal narrowing with distal vessel tortuosity. Low normal global LV function with EF  estimated at 50%; LVEDP 14 mmHg. Medical therapy recommended  His atheroslcerotic risk factors include uncontrolled DM, 45 year hx of smoking (quit in 2013), CAD, COPD, hyperlipidemia, and hypertension. He takes a daily statin and Plavix.  The pt is on a statin for cholesterol management.  The pt is on a daily aspirin.   Other AC:  Plavix The pt is on ARB for hypertension.   The pt is diabetic.  Tobacco hx:  Former smoker, quit 2013  Past Medical History:  Diagnosis Date  . Arthritis    RA  . Bronchitis   . Carotid artery occlusion   . Cervical spine disease    Neck surgery in 2007 with Dr. Joya Salm  . COPD (chronic obstructive pulmonary disease) ([place])   . Coronary atherosclerosis of native coronary artery    Nonobstrtuctive by catheterization 2007 and 2013  . DVT (deep venous thrombosis) ([place])   . Essential hypertension, benign   . GERD (gastroesophageal reflux disease)   . Hyperlipidemia   . PAD (peripheral artery disease) ([place])    s/p left fem-pop bypass 2007; PTA and stenting of right iliac artery 09/2011  . Rheumatoid arthritis(714.0)   . Type 2 diabetes mellitus ([place])     Past Surgical History:  Procedure Laterality Date  . ABDOMINAL AORTAGRAM N/A 10/03/2011   Procedure: ABDOMINAL Maxcine Ham;  Surgeon: Conrad Evadale, MD;  Location: [facility] CATH LAB;  Service: Cardiovascular;  Laterality: N/A;  . ABDOMINAL AORTOGRAM W/LOWER EXTREMITY N/A 01/16/2017  Procedure: ABDOMINAL AORTOGRAM W/LOWER EXTREMITY;  Surgeon: Conrad Canistota, MD;  Location: [place] CV LAB;  Service: Cardiovascular;  Laterality: N/A;  . CERVICAL DISC SURGERY    . COLONOSCOPY WITH PROPOFOL N/A 02/17/2017   Procedure: COLONOSCOPY WITH PROPOFOL;  Surgeon: Daneil Dolin, MD;  [facility];  Service: Endoscopy;  Laterality: N/A;  2:45pm  . FEMORAL-POPLITEAL BYPASS GRAFT  11/06/2005  . ILIAC ARTERY STENT  10-03-11   Right CIA stenting  . LEFT HEART CATH AND CORONARY ANGIOGRAPHY N/A 04/14/2019    Procedure: LEFT HEART CATH AND CORONARY ANGIOGRAPHY;  Surgeon: Troy Sine, MD;  Location: [place] CV LAB;  Service: Cardiovascular;  Laterality: N/A;  . PERIPHERAL VASCULAR BALLOON ANGIOPLASTY Right 01/16/2017   Procedure: PERIPHERAL VASCULAR BALLOON ANGIOPLASTY;  Surgeon: Conrad Fairview, MD;  Location: [place] CV LAB;  Service: Cardiovascular;  Laterality: Right;  common iliac   . PR VEIN BYPASS GRAFT,AORTO-FEM-POP  06-22-2010   Redo Left Fem-Pop  . Repair of left arm fracture    . 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 SURGERY SCROTAL / TESTICULAR      Social History   Socioeconomic History  . Marital status: Single    Spouse name: Not on file  . Number of children: Not on file  . Years of education: Not on file  . Highest education level: Not on file  Occupational History  . Not on file  Tobacco Use  . Smoking status: Former Smoker    Packs/day: 1.00    Years: 45.00    Pack years: 45.00    Types: Cigarettes, Cigars    Quit date: 09/01/2011    Years since quitting: 7.6  . Smokeless tobacco: Never Used  Substance and Sexual Activity  . Alcohol use: No    Alcohol/week: 0.0 standard drinks  . Drug use: No  . Sexual activity: Not Currently    Birth control/protection: None  Other Topics Concern  . Not on file  Social History Narrative   The patient lives in [place].    Social Determinants of Health   Financial Resource Strain:   . Difficulty of Paying Living Expenses:   Food Insecurity:   . Worried About Charity fundraiser in the Last Year:   . Arboriculturist in the Last Year:   Transportation Needs:   . Film/video editor (Medical):   Marland Kitchen Lack of Transportation (Non-Medical):   Physical Activity:   . Days of Exercise per Week:   . Minutes of Exercise per Session:   Stress:   . Feeling of Stress :   Social Connections:   . Frequency of Communication with Friends and Family:   . Frequency of Social Gatherings with Friends and Family:   . Attends Religious  Services:   . Active Member of Clubs or Organizations:   . Attends Archivist Meetings:   Marland Kitchen Marital Status:   Intimate Partner Violence:   . Fear of Current or Ex-Partner:   . Emotionally Abused:   Marland Kitchen Physically Abused:   . Sexually Abused:     Family History  Problem Relation Age of Onset  . Heart attack Mother 79  . Deep vein thrombosis Mother   . Diabetes Mother   . Hyperlipidemia Mother   . Hypertension Mother   . Peripheral vascular disease Mother   . Stroke Father   . Diabetes Father   . Hyperlipidemia Father   . Hypertension Father   . Peripheral  vascular disease Father   . Diabetes Sister   . Hyperlipidemia Sister   . Hypertension Sister   . Diabetes Brother   . Hyperlipidemia Brother   . Cancer Brother   . Deep vein thrombosis Brother   . Hypertension Brother   . Peripheral vascular disease Brother   . Hyperlipidemia Son   . Cancer Son   . Hypertension Son   . Colon cancer Neg Hx     Current Outpatient Medications  Medication Sig Dispense Refill  . aspirin EC 81 MG tablet Take 1 tablet (81 mg total) by mouth daily.    Marland Kitchen atorvastatin (LIPITOR) 20 MG tablet Take 20 mg by mouth daily.    . budesonide-formoterol (SYMBICORT) 160-4.5 MCG/ACT inhaler INHALE 2 PUFFS BY MOUTH TWICE A DAY RINSE MOUTH AFTER USE. 10.2 g 3  . celecoxib (CELEBREX) 200 MG capsule Take 1 capsule (200 mg total) by mouth daily. (HOLD X 5 DAYS WHILE TAKING THE IBUPROFEN) (Patient taking differently: Take 200 mg by mouth daily. )    . clopidogrel (PLAVIX) 75 MG tablet Take 1 tablet (75 mg total) by mouth daily. 30 tablet 11  . Dexlansoprazole (DEXILANT) 30 MG capsule Take 30- 60 min before your first and last meals of the day (Patient taking differently: Take 30 mg by mouth daily. ) 60 capsule 11  . diclofenac Sodium (VOLTAREN) 1 % GEL APPLY EXTERNALLY TO THE AFFECTED AREA TWICE DAILY    . docusate sodium (COLACE) 100 MG capsule Take 100 mg by mouth as needed.     Marland Kitchen glipiZIDE  (GLUCOTROL) 5 MG tablet Take 5 mg by mouth every morning.     Marland Kitchen HYDROcodone-acetaminophen (NORCO) 7.5-325 MG per tablet Take 1 tablet by mouth 2 (two) times daily as needed for moderate pain.     . isosorbide mononitrate (IMDUR) 30 MG 24 hr tablet Take 1 tablet (30 mg total) by mouth daily. 30 tablet 6  . JANUVIA 100 MG tablet Take 100 mg by mouth daily.     Marland Kitchen ketoconazole (NIZORAL) 2 % cream Apply to both feet once daily for 4 weeks 30 g 0  . losartan (COZAAR) 50 MG tablet Take 2 each am (Patient taking differently: Take 50 mg by mouth daily. Take 2 each am) 60 tablet 11  . metFORMIN (GLUCOPHAGE) 500 MG tablet Take 500 mg by mouth 2 (two) times daily with a meal.     . nitroGLYCERIN (NITROSTAT) 0.4 MG SL tablet Place 1 tablet (0.4 mg total) under the tongue every 5 (five) minutes as needed for chest pain. 25 tablet 3  . ONE TOUCH ULTRA TEST test strip     . PROAIR HFA 108 (90 BASE) MCG/ACT inhaler Inhale 2 puffs into the lungs every 6 (six) hours as needed for wheezing or shortness of breath.     . Tiotropium Bromide Monohydrate (SPIRIVA RESPIMAT) 2.5 MCG/ACT AERS Inhale 2 puffs into the lungs daily. 1 Inhaler 11  . Trolamine Salicylate (ASPERCREME EX) Apply topically as needed.    . vitamin C (ASCORBIC ACID) 500 MG tablet Take 500 mg by mouth daily.    Morrie Sheldon 5 MG TABS Take 1 tablet by mouth daily. 90 tablet 0   No current facility-administered medications for this visit.    No Known Allergies   REVIEW OF SYSTEMS:  Review of Systems  Constitutional: Negative for chills, fever and malaise/fatigue.  HENT: Negative for congestion and sore throat.   Eyes: Negative for double vision.  Respiratory: Positive for shortness of  breath. Negative for cough and wheezing.   Cardiovascular: Negative for chest pain, palpitations and leg swelling.  Gastrointestinal: Negative for abdominal pain, constipation, diarrhea, heartburn, nausea and vomiting.  Genitourinary: Negative for dysuria.    Musculoskeletal: Negative for myalgias.  Neurological: Negative for dizziness, weakness and headaches.  Endo/Heme/Allergies: Does not bruise/bleed easily.    PHYSICAL EXAMINATION:  Vitals:   04/27/19 0850  BP: 122/73  Pulse: 86  Resp: 16  Temp: (!) 97.5 F (36.4 C)  SpO2: 97%  Weight: 179 lb 6.4 oz (81.4 kg)  Height: 5\' 10"  (1.778 m)    General:  WDWN in NAD; vital signs documented above Gait: Normal HENT: WNL, normocephalic Pulmonary: normal non-labored breathing , without Rales, rhonchi,  wheezing Cardiac: regular HR, without  Murmurs without carotid bruit Abdomen: soft, NT, no masses Skin: without rashes Vascular Exam/Pulses:2+ radial pulses bilaterally. 2+ femoral pulses bilaterally. 2+ DP bilaterally. Doppler DP/PT/pero signals bilaterally. Bilateral feet warm. Motor and sensory intact Extremities: without ischemic changes, without Gangrene , without cellulitis; without open wounds;  Musculoskeletal: no muscle wasting or atrophy  Neurologic: A&O X 3;  No focal weakness or paresthesias are detected Psychiatric:  The pt has Normal affect.   Non-Invasive Vascular Imaging:   04/26/19 +-------+-----------+-----------+------------+------------+  ABI/TBIToday's ABIToday's TBIPrevious ABIPrevious TBI  +-------+-----------+-----------+------------+------------+  Right 1.29    0.63    [place]     0.57      +-------+-----------+-----------+------------+------------+  Left  [place]     0.61    [place]     0.54      +-------+-----------+-----------+------------+------------+   Left Graft #1: +--------------------+--------+--------+---------+--------+            PSV cm/sStenosisWaveform Comments  +--------------------+--------+--------+---------+--------+  Inflow       90       triphasic      +--------------------+--------+--------+---------+--------+  Proximal Anastomosis53       biphasic        +--------------------+--------+--------+---------+--------+  Proximal Graft   85       triphasic      +--------------------+--------+--------+---------+--------+  Mid Graft      58       triphasic      +--------------------+--------+--------+---------+--------+  Distal Graft    62       triphasic      +--------------------+--------+--------+---------+--------+  Distal Anastomosis 117       triphasic      +--------------------+--------+--------+---------+--------+  Outflow       49       triphasic      +--------------------+--------+--------+---------+--------+   Summary:  Left: Patent graft with no evidence for restenosis.     ASSESSMENT/PLAN:: 69 y.o. male here for follow up for s/p left common femoral to above knee popliteal bypass with propaten in 2007 by Dr. Kellie Simmering and then a redo left common femoral to above knee popliteal bypass with propaten in 2012. In addition he has undergone a subintimal angioplasty of his right common iliac artery in 2013 with a drug-coated balloon angioplasty in 2019 by Dr. Bridgett Larsson. His non invasive studies done on 04/26/19 show patent left lower extremity bypass graft and stable ABI's. He presently is not having worsening lower extremity discomfort and it does not sound like rest pain or claudication type pain. His bilateral lower extremities are well perfused with palpable DP pulses and warm to touch  - encourage him to increase his exercise tolerance - continue his current Aspirin, statin and Plavix medication - I have recommend he follow up sooner if he develops disabling lower extremity symptoms, rest  pain or new non healing wounds - He will follow up in 3-4 months with ABI's  - He will have repeat left lower extremity bypass graft duplex in 6 months   Karoline Caldwell, PA-C [facility] [phone]  Clinic MD:  Dr. Carlis Abbott

## 2019-04-28 ENCOUNTER — Other Ambulatory Visit: Payer: Self-pay | Admitting: *Deleted

## 2019-04-28 DIAGNOSIS — Z95828 Presence of other vascular implants and grafts: Secondary | ICD-10-CM

## 2019-04-28 DIAGNOSIS — I739 Peripheral vascular disease, unspecified: Secondary | ICD-10-CM

## 2019-05-07 DIAGNOSIS — E1165 Type 2 diabetes mellitus with hyperglycemia: Secondary | ICD-10-CM | POA: Diagnosis not present

## 2019-05-07 DIAGNOSIS — I10 Essential (primary) hypertension: Secondary | ICD-10-CM | POA: Diagnosis not present

## 2019-05-11 DIAGNOSIS — M5412 Radiculopathy, cervical region: Secondary | ICD-10-CM | POA: Diagnosis not present

## 2019-05-11 DIAGNOSIS — M7541 Impingement syndrome of right shoulder: Secondary | ICD-10-CM | POA: Diagnosis not present

## 2019-05-11 DIAGNOSIS — M4802 Spinal stenosis, cervical region: Secondary | ICD-10-CM | POA: Diagnosis not present

## 2019-05-11 DIAGNOSIS — Z79899 Other long term (current) drug therapy: Secondary | ICD-10-CM | POA: Diagnosis not present

## 2019-05-12 DIAGNOSIS — Z6824 Body mass index (BMI) 24.0-24.9, adult: Secondary | ICD-10-CM | POA: Diagnosis not present

## 2019-05-12 DIAGNOSIS — Z79899 Other long term (current) drug therapy: Secondary | ICD-10-CM | POA: Diagnosis not present

## 2019-05-12 DIAGNOSIS — Z7189 Other specified counseling: Secondary | ICD-10-CM | POA: Diagnosis not present

## 2019-05-12 DIAGNOSIS — Z299 Encounter for prophylactic measures, unspecified: Secondary | ICD-10-CM | POA: Diagnosis not present

## 2019-05-12 DIAGNOSIS — R5383 Other fatigue: Secondary | ICD-10-CM | POA: Diagnosis not present

## 2019-05-12 DIAGNOSIS — I10 Essential (primary) hypertension: Secondary | ICD-10-CM | POA: Diagnosis not present

## 2019-05-12 DIAGNOSIS — Z0000 Encounter for general adult medical examination without abnormal findings: Secondary | ICD-10-CM | POA: Diagnosis not present

## 2019-05-12 DIAGNOSIS — Z1211 Encounter for screening for malignant neoplasm of colon: Secondary | ICD-10-CM | POA: Diagnosis not present

## 2019-05-12 DIAGNOSIS — E7800 Pure hypercholesterolemia, unspecified: Secondary | ICD-10-CM | POA: Diagnosis not present

## 2019-05-13 ENCOUNTER — Encounter: Payer: Self-pay | Admitting: Cardiology

## 2019-05-13 ENCOUNTER — Ambulatory Visit: Payer: Medicare Other | Admitting: Cardiology

## 2019-05-13 NOTE — Progress Notes (Deleted)
Cardiology Office Note  Date: 05/13/2019   ID: [name], DOB 09/09/1950, MRN [id number]  PCP:  [name], MD  Cardiologist:  Rozann Lesches, MD Electrophysiologist:  None   No chief complaint on file.   History of Present Illness: [name] is a 69 y.o. male last seen in April by Mr. Reino Bellis.  He was referred for diagnostic cardiac catheterization at that time.  Procedure was performed on April 7 by Dr. Napoleon Form and revealed mild nonobstructive CAD with 20 to 30% stenosis in the proximal to mid LAD and 30% stenosis within the RCA.  LVEF estimated to 50%.  Past Medical History:  Diagnosis Date  . Arthritis    RA  . Bronchitis   . Carotid artery occlusion   . Cervical spine disease    Neck surgery in 2007 with Dr. Joya Salm  . COPD (chronic obstructive pulmonary disease) ([place])   . Coronary atherosclerosis of native coronary artery    Nonobstrtuctive by catheterization 2007, 2013, 2021  . DVT (deep venous thrombosis) ([place])   . Essential hypertension   . GERD (gastroesophageal reflux disease)   . Hyperlipidemia   . PAD (peripheral artery disease) ([place])    s/p left fem-pop bypass 2007; PTA and stenting of right iliac artery 09/2011  . Rheumatoid arthritis(714.0)   . Type 2 diabetes mellitus ([place])     Past Surgical History:  Procedure Laterality Date  . ABDOMINAL AORTAGRAM N/A 10/03/2011   Procedure: ABDOMINAL Maxcine Ham;  Surgeon: Conrad Chapin, MD;  [facility] CATH LAB;  Service: Cardiovascular;  Laterality: N/A;  . ABDOMINAL AORTOGRAM W/LOWER EXTREMITY N/A 01/16/2017   Procedure: ABDOMINAL AORTOGRAM W/LOWER EXTREMITY;  Surgeon: Conrad Cibola, MD;  Location: [place] CV LAB;  Service: Cardiovascular;  Laterality: N/A;  . CERVICAL DISC SURGERY    . COLONOSCOPY WITH PROPOFOL N/A 02/17/2017   Procedure: COLONOSCOPY WITH PROPOFOL;  Surgeon: Daneil Dolin, MD;  [facility];  Service: Endoscopy;  Laterality: N/A;  2:45pm  . FEMORAL-POPLITEAL BYPASS  GRAFT  11/06/2005  . ILIAC ARTERY STENT  10-03-11   Right CIA stenting  . LEFT HEART CATH AND CORONARY ANGIOGRAPHY N/A 04/14/2019   Procedure: LEFT HEART CATH AND CORONARY ANGIOGRAPHY;  Surgeon: Troy Sine, MD;  Location: [place] CV LAB;  Service: Cardiovascular;  Laterality: N/A;  . PERIPHERAL VASCULAR BALLOON ANGIOPLASTY Right 01/16/2017   Procedure: PERIPHERAL VASCULAR BALLOON ANGIOPLASTY;  Surgeon: Conrad Edinburg, MD;  Location: [place] CV LAB;  Service: Cardiovascular;  Laterality: Right;  common iliac   . PR VEIN BYPASS GRAFT,AORTO-FEM-POP  06-22-2010   Redo Left Fem-Pop  . Repair of left arm fracture    . 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 SURGERY SCROTAL / TESTICULAR      Current Outpatient Medications  Medication Sig Dispense Refill  . aspirin EC 81 MG tablet Take 1 tablet (81 mg total) by mouth daily.    Marland Kitchen atorvastatin (LIPITOR) 20 MG tablet Take 20 mg by mouth daily.    . budesonide-formoterol (SYMBICORT) 160-4.5 MCG/ACT inhaler INHALE 2 PUFFS BY MOUTH TWICE A DAY RINSE MOUTH AFTER USE. 10.2 g 3  . celecoxib (CELEBREX) 200 MG capsule Take 1 capsule (200 mg total) by mouth daily. (HOLD X 5 DAYS WHILE TAKING THE IBUPROFEN) (Patient taking differently: Take 200 mg by mouth daily. )    . clopidogrel (PLAVIX) 75 MG tablet Take 1 tablet (75 mg total) by mouth daily. 30 tablet 11  . Dexlansoprazole (  DEXILANT) 30 MG capsule Take 30- 60 min before your first and last meals of the day (Patient taking differently: Take 30 mg by mouth daily. ) 60 capsule 11  . diclofenac Sodium (VOLTAREN) 1 % GEL APPLY EXTERNALLY TO THE AFFECTED AREA TWICE DAILY    . docusate sodium (COLACE) 100 MG capsule Take 100 mg by mouth as needed.     Marland Kitchen glipiZIDE (GLUCOTROL) 5 MG tablet Take 5 mg by mouth every morning.     Marland Kitchen HYDROcodone-acetaminophen (NORCO) 7.5-325 MG per tablet Take 1 tablet by mouth 2 (two) times daily as needed for moderate pain.     . isosorbide mononitrate (IMDUR) 30 MG 24 hr tablet Take  1 tablet (30 mg total) by mouth daily. 30 tablet 6  . JANUVIA 100 MG tablet Take 100 mg by mouth daily.     Marland Kitchen ketoconazole (NIZORAL) 2 % cream Apply to both feet once daily for 4 weeks 30 g 0  . losartan (COZAAR) 50 MG tablet Take 2 each am (Patient taking differently: Take 50 mg by mouth daily. Take 2 each am) 60 tablet 11  . metFORMIN (GLUCOPHAGE) 500 MG tablet Take 500 mg by mouth 2 (two) times daily with a meal.     . nitroGLYCERIN (NITROSTAT) 0.4 MG SL tablet Place 1 tablet (0.4 mg total) under the tongue every 5 (five) minutes as needed for chest pain. 25 tablet 3  . ONE TOUCH ULTRA TEST test strip     . PROAIR HFA 108 (90 BASE) MCG/ACT inhaler Inhale 2 puffs into the lungs every 6 (six) hours as needed for wheezing or shortness of breath.     . Tiotropium Bromide Monohydrate (SPIRIVA RESPIMAT) 2.5 MCG/ACT AERS Inhale 2 puffs into the lungs daily. 1 Inhaler 11  . Trolamine Salicylate (ASPERCREME EX) Apply topically as needed.    . vitamin C (ASCORBIC ACID) 500 MG tablet Take 500 mg by mouth daily.    Morrie Sheldon 5 MG TABS Take 1 tablet by mouth daily. 90 tablet 0   No current facility-administered medications for this visit.   Allergies:  Patient has no known allergies.   ROS:  Please see the history of present illness. Otherwise, complete review of systems is positive for {NONE DEFAULTED:18576::"none"}.  All other systems are reviewed and negative.   Physical Exam: VS:  There were no vitals taken for this visit., BMI There is no height or weight on file to calculate BMI.  Wt Readings from Last 3 Encounters:  04/27/19 179 lb 6.4 oz (81.4 kg)  04/14/19 180 lb (81.6 kg)  04/13/19 180 lb 9.6 oz (81.9 kg)    General: Patient appears comfortable at rest. HEENT: Conjunctiva and lids normal, oropharynx clear with moist mucosa. Neck: Supple, no elevated JVP or carotid bruits, no thyromegaly. Lungs: Clear to auscultation, nonlabored breathing at rest. Cardiac: Regular rate and rhythm, no S3  or significant systolic murmur, no pericardial rub. Abdomen: Soft, nontender, no hepatomegaly, bowel sounds present, no guarding or rebound. Extremities: No pitting edema, distal pulses 2+. Skin: Warm and dry. Musculoskeletal: No kyphosis. Neuropsychiatric: Alert and oriented x3, affect grossly appropriate.  ECG:  An ECG dated 04/13/2019 was personally reviewed today and demonstrated:  Sinus rhythm with low voltage in the limb leads, nonspecific T wave changes.  Recent Labwork: 05/20/2018: Pro B Natriuretic peptide (BNP) 61.0; TSH 1.35 01/15/2019: ALT 23; AST 25 04/13/2019: BUN 14; Creatinine, Ser 0.88; Hemoglobin 12.3; Platelets 283; Potassium 4.1; Sodium 140     Component Value  Date/Time   CHOL 163 01/15/2019 1620   TRIG 134 01/15/2019 1620   HDL 56 01/15/2019 1620   CHOLHDL 2.9 01/15/2019 1620   CHOLHDL 2.7 08/06/2018 1035   LDLCALC 84 01/15/2019 1620   LDLCALC 88 08/06/2018 1035    Other Studies Reviewed Today:  Cardiac catheterization 04/14/2019:  Prox LAD to Mid LAD lesion is 20% stenosed.  Mid LAD lesion is 30% stenosed.  Prox RCA lesion is 30% stenosed.   Mild non-obstructive CAD with 20 and 30% irregularities in the proximal to mid LAD; normal ramus intermediate and left circumflex coronary arteries;  and RCA with smooth 30% proximal narrowing with distal vessel tortuosity.  Low normal global LV function with EF estimated at 50%; LVEDP 14 mmHg.  RECOMMENDATION: Medical therapy for CAD.  Will increase lipid-lowering therapy to atorvastatin 40 mg.  Maintain optimal blood pressure control with target less than 130/80 and ideal blood pressure less than 120/80. Target LDL less than 70.  Assessment and Plan:   Medication Adjustments/Labs and Tests Ordered: Current medicines are reviewed at length with the patient today.  Concerns regarding medicines are outlined above.   Tests Ordered: No orders of the defined types were placed in this encounter.   Medication  Changes: No orders of the defined types were placed in this encounter.   Disposition:  Follow up {follow up:15908}  Signed, Satira Sark, MD, [facility] 05/13/2019 12:05 PM    [facility] [address], [place], [place] [address] Phone: [phone]; Fax: [phone]

## 2019-05-19 NOTE — Progress Notes (Signed)
Cardiology Office Note  Date: 05/20/2019   ID: [name], DOB 1951/01/02, MRN [id number]  PCP:  [name], MD  Cardiologist:  Rozann Lesches, MD Electrophysiologist:  None   Chief Complaint: Follow-up chest pain and increasing DOE, cardiac catheterization  History of Present Illness: [name] is a 69 y.o. male with a history of vascular disease, DM type II, HTN, HLD, COPD, RA, DJD, PAD (left femoral-popliteal bypass graft in 2007 with redo in 2012, RCIA PTA 2013 in January 2019), left lower extremity claudication.  Last saw [name], [place] at the [place] office on 04/13/2019 with complaints of chest pain and increasing dyspnea on exertion.  He described his chest pains as sharp but he also had pressure.  He previously had been able to use an exercise bike and walk.  He stated he was having increasing shortness of breath with these activities of recent.  He felt as though the symptoms were progressing.  His PCP ordered a CT of the chest  March 2021 revealing severe atherosclerosis in the LAD and was referred here for further evaluation and catheterization.  He had not had rest symptoms and had not taken any medication to try to alleviate the symptoms.  Mr. Rosalyn Gess arranged for cardiac catheterization.  Mr. Rosalyn Gess added Imdur 30 mg, aspirin 81 mg and sublingual nitroglycerin.   Cardiac catheterization on 04/14/2019 showed nonobstructive disease.  Patient states he been doing well from a cardiac standpoint.  He denies any progressive anginal or exertional symptoms, palpitations or arrhythmias, orthostatic symptoms, stroke or TIA-like symptoms, blood in stool or urine.  Claudication-like symptoms other than some mild cramping in his left leg at times.  He has had left femoropopliteal bypass in 2017.  He states since taking the Imdur his pulses in his left lower extremity are palpable now.  He states prior to taking the medication pulses were not palpable.  He states he has been issues with  daytime hypersomnolence.  He states he snores sometimes and is not aware if he has periods of apnea or not.  He states this seemed to correlate with starting the Imdur.  He states he likely does not get adequate sleep during the night.  States he usually has to get up at least once during the night.  He mentioned that he recently fell asleep briefly while driving.  Fortunately he woke up quickly.   Past Medical History:  Diagnosis Date  . Arthritis    RA  . Bronchitis   . Carotid artery occlusion   . Cervical spine disease    Neck surgery in 2007 with Dr. Joya Salm  . COPD (chronic obstructive pulmonary disease) ([place])   . Coronary atherosclerosis of native coronary artery    Nonobstrtuctive by catheterization 2007, 2013, 2021  . DVT (deep venous thrombosis) ([place])   . Essential hypertension   . GERD (gastroesophageal reflux disease)   . Hyperlipidemia   . PAD (peripheral artery disease) ([place])    s/p left fem-pop bypass 2007; PTA and stenting of right iliac artery 09/2011  . Rheumatoid arthritis(714.0)   . Type 2 diabetes mellitus ([place])     Past Surgical History:  Procedure Laterality Date  . ABDOMINAL AORTAGRAM N/A 10/03/2011   Procedure: ABDOMINAL Maxcine Ham;  Surgeon: Conrad Springbrook, MD;  Location: [facility] CATH LAB;  Service: Cardiovascular;  Laterality: N/A;  . ABDOMINAL AORTOGRAM W/LOWER EXTREMITY N/A 01/16/2017   Procedure: ABDOMINAL AORTOGRAM W/LOWER EXTREMITY;  Surgeon: Conrad Waverly, MD;  Location: [facility] CV LAB;  Service: Cardiovascular;  Laterality: N/A;  . CERVICAL DISC SURGERY    . COLONOSCOPY WITH PROPOFOL N/A 02/17/2017   Procedure: COLONOSCOPY WITH PROPOFOL;  Surgeon: Daneil Dolin, MD;  [facility];  Service: Endoscopy;  Laterality: N/A;  2:45pm  . FEMORAL-POPLITEAL BYPASS GRAFT  11/06/2005  . ILIAC ARTERY STENT  10-03-11   Right CIA stenting  . LEFT HEART CATH AND CORONARY ANGIOGRAPHY N/A 04/14/2019   Procedure: LEFT HEART CATH AND CORONARY ANGIOGRAPHY;  Surgeon:  Troy Sine, MD;  Location: [facility] CV LAB;  Service: Cardiovascular;  Laterality: N/A;  . PERIPHERAL VASCULAR BALLOON ANGIOPLASTY Right 01/16/2017   Procedure: PERIPHERAL VASCULAR BALLOON ANGIOPLASTY;  Surgeon: Conrad [place], MD;  Location: [facility] CV LAB;  Service: Cardiovascular;  Laterality: Right;  common iliac   . PR VEIN BYPASS GRAFT,AORTO-FEM-POP  06-22-2010   Redo Left Fem-Pop  . Repair of left arm fracture    . 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 SURGERY SCROTAL / TESTICULAR      Current Outpatient Medications  Medication Sig Dispense Refill  . aspirin EC 81 MG tablet Take 1 tablet (81 mg total) by mouth daily.    Marland Kitchen atorvastatin (LIPITOR) 20 MG tablet Take 20 mg by mouth daily.    . budesonide-formoterol (SYMBICORT) 160-4.5 MCG/ACT inhaler INHALE 2 PUFFS BY MOUTH TWICE A DAY RINSE MOUTH AFTER USE. 10.2 g 3  . celecoxib (CELEBREX) 200 MG capsule Take 1 capsule (200 mg total) by mouth daily. (HOLD X 5 DAYS WHILE TAKING THE IBUPROFEN) (Patient taking differently: Take 200 mg by mouth daily. )    . clopidogrel (PLAVIX) 75 MG tablet Take 1 tablet (75 mg total) by mouth daily. 30 tablet 11  . Dexlansoprazole (DEXILANT) 30 MG capsule Take 30- 60 min before your first and last meals of the day (Patient taking differently: Take 30 mg by mouth daily. ) 60 capsule 11  . diclofenac Sodium (VOLTAREN) 1 % GEL APPLY EXTERNALLY TO THE AFFECTED AREA TWICE DAILY    . docusate sodium (COLACE) 100 MG capsule Take 100 mg by mouth as needed.     Marland Kitchen glipiZIDE (GLUCOTROL) 5 MG tablet Take 5 mg by mouth every morning.     Marland Kitchen HYDROcodone-acetaminophen (NORCO) 7.5-325 MG per tablet Take 1 tablet by mouth 2 (two) times daily as needed for moderate pain.     . isosorbide mononitrate (IMDUR) 30 MG 24 hr tablet Take 1 tablet (30 mg total) by mouth daily. 30 tablet 6  . JANUVIA 100 MG tablet Take 100 mg by mouth daily.     Marland Kitchen ketoconazole (NIZORAL) 2 % cream Apply to both feet once daily for 4 weeks 30 g 0    . losartan (COZAAR) 50 MG tablet Take 2 each am (Patient taking differently: Take 50 mg by mouth daily. Take 2 each am) 60 tablet 11  . metFORMIN (GLUCOPHAGE) 500 MG tablet Take 500 mg by mouth 2 (two) times daily with a meal.     . nitroGLYCERIN (NITROSTAT) 0.4 MG SL tablet Place 1 tablet (0.4 mg total) under the tongue every 5 (five) minutes as needed for chest pain. 25 tablet 3  . ONE TOUCH ULTRA TEST test strip     . PROAIR HFA 108 (90 BASE) MCG/ACT inhaler Inhale 2 puffs into the lungs every 6 (six) hours as needed for wheezing or shortness of breath.     . Tiotropium Bromide Monohydrate (SPIRIVA RESPIMAT) 2.5 MCG/ACT AERS Inhale 2 puffs into the  lungs daily. 1 Inhaler 11  . Trolamine Salicylate (ASPERCREME EX) Apply topically as needed.    . vitamin C (ASCORBIC ACID) 500 MG tablet Take 500 mg by mouth daily.    [name] 5 MG TABS Take 1 tablet by mouth daily. 90 tablet 0   No current facility-administered medications for this visit.   Allergies:  Patient has no known allergies.   Social History: The patient  reports that he quit smoking about 7 years ago. His smoking use included cigarettes and cigars. He has a 45.00 pack-year smoking history. He has never used smokeless tobacco. He reports that he does not drink alcohol or use drugs.   Family History: The patient's family history includes Cancer in his brother and son; Deep vein thrombosis in his brother and mother; Diabetes in his brother, father, mother, and sister; Heart attack (age of onset: 38) in his mother; Hyperlipidemia in his brother, father, mother, sister, and son; Hypertension in his brother, father, mother, sister, and son; Peripheral vascular disease in his brother, father, and mother; Stroke in his father.   ROS:  Please see the history of present illness. Otherwise, complete review of systems is positive for none.  All other systems are reviewed and negative.   Physical Exam: VS:  BP 122/82   Pulse 87   Ht 5\' 10"   (1.778 m)   Wt 176 lb 9.6 oz (80.1 kg)   SpO2 92%   BMI 25.34 kg/m , BMI Body mass index is 25.34 kg/m.  Wt Readings from Last 3 Encounters:  05/20/19 176 lb 9.6 oz (80.1 kg)  04/27/19 179 lb 6.4 oz (81.4 kg)  04/14/19 180 lb (81.6 kg)    General: Patient appears comfortable at rest. Neck: Supple, no elevated JVP or carotid bruits, no thyromegaly. Lungs: Clear to auscultation, nonlabored breathing at rest. Cardiac: Regular rate and rhythm, no S3 or significant systolic murmur, no pericardial rub. Extremities: No pitting edema, distal pulses 2+. Skin: Warm and dry. Musculoskeletal: No kyphosis. Neuropsychiatric: Alert and oriented x3, affect grossly appropriate.  ECG:  EKG on 04/13/2019 sinus rhythm rate of 89 nonspecific T wave abnormality.  Recent Labwork: 05/20/2018: Pro B Natriuretic peptide (BNP) 61.0; TSH 1.35 01/15/2019: ALT 23; AST 25 04/13/2019: BUN 14; Creatinine, Ser 0.88; Hemoglobin 12.3; Platelets 283; Potassium 4.1; Sodium 140     Component Value Date/Time   CHOL 163 01/15/2019 1620   TRIG 134 01/15/2019 1620   HDL 56 01/15/2019 1620   CHOLHDL 2.9 01/15/2019 1620   CHOLHDL 2.7 08/06/2018 1035   LDLCALC 84 01/15/2019 1620   Thomson 88 08/06/2018 1035    Other Studies Reviewed Today: Chest CT findings 04/13/2019   Cardiac catheterization 04/14/2019  Prox LAD to Mid LAD lesion is 20% stenosed.  Mid LAD lesion is 30% stenosed.  Prox RCA lesion is 30% stenosed.   Mild non-obstructive CAD with 20 and 30% irregularities in the proximal to mid LAD; normal ramus intermediate and left circumflex coronary arteries;  and RCA with smooth 30% proximal narrowing with distal vessel tortuosity.  Low normal global LV function with EF estimated at 50%; LVEDP 14 mmHg.  RECOMMENDATION: Medical therapy for CAD.  Will increase lipid-lowering therapy to atorvastatin 40 mg.  Maintain optimal blood pressure control with target less than 130/80 and ideal blood pressure less than  120/80.  Target LDL less than 70.  Diagnostic Dominance: Right  Intervention   Vascular ultrasound ABI with without TBI 04/26/2019 Summary: Right: Resting right ankle-brachial index is within normal  range. No evidence of significant right lower extremity arterial disease. The right toe-brachial index is abnormal. Left: Resting left ankle-brachial index indicates noncompressible left lower extremity arteries. Teiphasic waveforms suggest adequate perfusion. The left toe-brachial index is abnormal.  Vascular ultrasound lower extremity bypass graft duplex 04/26/2019 Summary:  Left: Patent graft with no evidence for restenosis.   Assessment and Plan:  1. Chronic chest pain with high risk for coronary artery disease   2. Coronary artery calcification seen on CT scan   3. Essential hypertension   4. DOE (dyspnea on exertion)   5. Dyslipidemia, goal LDL below 70   6. Daytime somnolence    1. Chronic chest pain with high risk for coronary artery disease Cardiac catheterization 04/14/2019 showed proximal LAD to mid LAD lesion 20% stenosis.  Mid LAD lesion 30%, proximal RCA lesion 30%.  Medical therapy recommended.  Today he denies any anginal or exertional symptoms.  Continue aspirin 81 mg daily, Plavix 75 mg daily, Imdur 30 mg p.o. daily, sublingual nitroglycerin 0.4 mg as needed for chest pain.  2. Coronary artery calcification seen on CT scan Chest CT on 04/13/2019 showed significant atherosclerosis within the coronary vasculature and aortic arch.  Atherosclerosis is greatest in the LAD.  3. Essential hypertension Blood pressure is well controlled today.  Blood pressure 122/82.  Continue losartan 50 mg 2 pills every morning.  4. DOE (dyspnea on exertion) Patient denies any current dyspnea on exertion.  He he does state he has COPD.  Previous history of smoking but quit several years ago.  5. Dyslipidemia, goal LDL below 70 Atorvastatin increased 40 mg daily after cardiac catheterization.   Target LDL less than 70.  Continue atorvastatin 40 mg daily.  6.  Daytime hypersomnolence Patient states since 3 weeks ago he has started having excessive daytime hypersomnolence.  He states on one occasion he fell asleep while sitting in his car in traffic.  He admits to occasional snoring.  He denies any known periods of apnea.  He states he does not feel he gets adequate sleep at night.  Please refer patient for evaluation for sleep apnea.  Medication Adjustments/Labs and Tests Ordered: Current medicines are reviewed at length with the patient today.  Concerns regarding medicines are outlined above.   Disposition: Follow-up with Dr. Domenic Polite or APP 3 months.  Signed, Levell July, NP 05/20/2019 8:56 AM    [facility] at [place], [place], [place] [address] Phone: [phone]; Fax: [phone]

## 2019-05-20 ENCOUNTER — Encounter: Payer: Self-pay | Admitting: Family Medicine

## 2019-05-20 ENCOUNTER — Ambulatory Visit (INDEPENDENT_AMBULATORY_CARE_PROVIDER_SITE_OTHER): Payer: Medicare Other | Admitting: Family Medicine

## 2019-05-20 ENCOUNTER — Other Ambulatory Visit: Payer: Self-pay

## 2019-05-20 VITALS — BP 122/82 | HR 87 | Ht 70.0 in | Wt 176.6 lb

## 2019-05-20 DIAGNOSIS — I2510 Atherosclerotic heart disease of native coronary artery without angina pectoris: Secondary | ICD-10-CM

## 2019-05-20 DIAGNOSIS — G8929 Other chronic pain: Secondary | ICD-10-CM

## 2019-05-20 DIAGNOSIS — R0600 Dyspnea, unspecified: Secondary | ICD-10-CM

## 2019-05-20 DIAGNOSIS — R079 Chest pain, unspecified: Secondary | ICD-10-CM

## 2019-05-20 DIAGNOSIS — E785 Hyperlipidemia, unspecified: Secondary | ICD-10-CM

## 2019-05-20 DIAGNOSIS — R0609 Other forms of dyspnea: Secondary | ICD-10-CM

## 2019-05-20 DIAGNOSIS — R400 Somnolence: Secondary | ICD-10-CM

## 2019-05-20 DIAGNOSIS — Z9189 Other specified personal risk factors, not elsewhere classified: Secondary | ICD-10-CM

## 2019-05-20 DIAGNOSIS — I10 Essential (primary) hypertension: Secondary | ICD-10-CM

## 2019-05-20 NOTE — Patient Instructions (Signed)
Medication Instructions:  Continue all current medications.  Labwork: none  Testing/Procedures: none  Follow-Up: 3 months   Any Other Special Instructions Will Be Listed Below (If Applicable). You have been referred to:  [facility]   If you need a refill on your cardiac medications before your next appointment, please call your pharmacy.

## 2019-06-06 DIAGNOSIS — E1165 Type 2 diabetes mellitus with hyperglycemia: Secondary | ICD-10-CM | POA: Diagnosis not present

## 2019-06-06 DIAGNOSIS — E119 Type 2 diabetes mellitus without complications: Secondary | ICD-10-CM | POA: Diagnosis not present

## 2019-06-06 DIAGNOSIS — I10 Essential (primary) hypertension: Secondary | ICD-10-CM | POA: Diagnosis not present

## 2019-06-10 ENCOUNTER — Other Ambulatory Visit: Payer: Self-pay | Admitting: Internal Medicine

## 2019-06-10 MED ORDER — SPIRIVA RESPIMAT 2.5 MCG/ACT IN AERS: 2.0000 | INHALATION_SPRAY | Freq: Every day | RESPIRATORY_TRACT | 11 refills | Status: DC

## 2019-06-11 DIAGNOSIS — M25512 Pain in left shoulder: Secondary | ICD-10-CM | POA: Diagnosis not present

## 2019-06-11 DIAGNOSIS — M25511 Pain in right shoulder: Secondary | ICD-10-CM | POA: Diagnosis not present

## 2019-06-14 NOTE — Progress Notes (Deleted)
Office Visit Note  Patient: [name]             Date of Birth: 1950-05-31           MRN: [id number]             PCP: [name], MD Referring: [name], MD Visit Date: 06/28/2019 Occupation: @GUAROCC @  Subjective:  No chief complaint on file.   History of Present Illness: [name] is a 69 y.o. male ***   Activities of Daily Living:  Patient reports morning stiffness for *** {minute/hour:19697}.   Patient {ACTIONS;DENIES/REPORTS:21021675::"Denies"} nocturnal pain.  Difficulty dressing/grooming: {ACTIONS;DENIES/REPORTS:21021675::"Denies"} Difficulty climbing stairs: {ACTIONS;DENIES/REPORTS:21021675::"Denies"} Difficulty getting out of chair: {ACTIONS;DENIES/REPORTS:21021675::"Denies"} Difficulty using hands for taps, buttons, cutlery, and/or writing: {ACTIONS;DENIES/REPORTS:21021675::"Denies"}  No Rheumatology ROS completed.   PMFS History:  Patient Active Problem List   Diagnosis Date Noted  . Puncture wound of finger of left hand 08/08/2018  . Puncture wound of finger, right, complicated, initial encounter 08/05/2018  . DOE (dyspnea on exertion) 11/04/2017  . Muscle cramps 07/04/2017  . Contracture of left elbow 07/04/2017  . High risk medication use 06/23/2017  . Primary osteoarthritis of both hands 06/23/2017  . Primary osteoarthritis of both feet 06/23/2017  . 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 factor ([place]) 06/23/2017  . DDD (degenerative disc disease), cervical 06/23/2017  . Rotator cuff syndrome of right shoulder 06/06/2017  . History of  bilateral carpal tunnel release 06/06/2017  . Ganglion cyst of wrist, left 06/06/2017  . Non-insulin dependent type 2 diabetes mellitus ([place]) 06/06/2017  . Chest pain with high risk for cardiac etiology 04/05/2017  . COPD with acute exacerbation ([place]) 03/13/2017  . Plantar fasciitis 02/21/2017  . Iliac artery stenosis, right ([place]) 02/12/2017  . Hematochezia 02/10/2017  . Abnormal  CT of the abdomen 02/10/2017  . Cervical spondylosis 10/11/2016  . Bony growth 10/13/2015  . Pain in joint, lower leg 03/02/2014  . Discoloration of skin-Left dorsum foot 09/28/2013  . Swelling of limb-Left Calf / Leg 09/28/2013  . PVD (peripheral vascular disease) ([place]) 10/14/2012  . Pain in limb- Left popliteal and calf 10/14/2012  . Dyslipidemia, goal LDL below 70   . Essential hypertension 03/05/2012  . 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 Generalized osteoarthritis of multiple sites 02/06/2011  . History of arterial bypass of lower limb 12/25/2010  . Atherosclerosis of native arteries of extremity with intermittent claudication ([place]) 12/25/2010  . Coronary artery calcification seen on CT scan 04/16/2008  . COPD  GOLD II 04/16/2008  . GASTROESOPHAGEAL REFLUX DISEASE 04/16/2008  . Rheumatoid arthritis ([place]) 04/16/2008  . DIABETES MELLITUS, BORDERLINE 04/16/2008    Past Medical History:  Diagnosis Date  . Arthritis    RA  . Bronchitis   . Carotid artery occlusion   . Cervical spine disease    Neck surgery in 2007 with Dr. Joya Salm  . COPD (chronic obstructive pulmonary disease) ([place])   . Coronary atherosclerosis of native coronary artery    Nonobstrtuctive by catheterization 2007, 2013, 2021  . DVT (deep venous thrombosis) ([place])   . Essential hypertension   . GERD (gastroesophageal reflux disease)   . Hyperlipidemia   . PAD (peripheral artery disease) ([place])    s/p left fem-pop bypass 2007; PTA and stenting of right iliac artery 09/2011  . Rheumatoid arthritis(714.0)   . Type 2 diabetes mellitus ([facility])     Family History  Problem Relation Age of Onset  . Heart attack Mother 89  . Deep vein  thrombosis Mother   . Diabetes Mother   . Hyperlipidemia Mother   . Hypertension Mother   . Peripheral vascular disease Mother   . Stroke Father   . Diabetes Father   . Hyperlipidemia Father   . Hypertension Father   . Peripheral vascular disease Father   . Diabetes  Sister   . Hyperlipidemia Sister   . Hypertension Sister   . Diabetes Brother   . Hyperlipidemia Brother   . Cancer Brother   . Deep vein thrombosis Brother   . Hypertension Brother   . Peripheral vascular disease Brother   . Hyperlipidemia Son   . Cancer Son   . Hypertension Son   . Colon cancer Neg Hx    Past Surgical History:  Procedure Laterality Date  . ABDOMINAL AORTAGRAM N/A 10/03/2011   Procedure: ABDOMINAL Maxcine Ham;  Surgeon: Conrad Holdrege, MD;  Location[facility] CATH LAB;  Service: Cardiovascular;  Laterality: N/A;  . ABDOMINAL AORTOGRAM W/LOWER EXTREMITY N/A 01/16/2017   Procedure: ABDOMINAL AORTOGRAM W/LOWER EXTREMITY;  Surgeon: Conrad [place], MD;  Location: [facility] CV LAB;  Service: Cardiovascular;  Laterality: N/A;  . CERVICAL DISC SURGERY    . COLONOSCOPY WITH PROPOFOL N/A 02/17/2017   Procedure: COLONOSCOPY WITH PROPOFOL;  Surgeon: Daneil Dolin, MD;  [facility];  Service: Endoscopy;  Laterality: N/A;  2:45pm  . FEMORAL-POPLITEAL BYPASS GRAFT  11/06/2005  . ILIAC ARTERY STENT  10-03-11   Right CIA stenting  . LEFT HEART CATH AND CORONARY ANGIOGRAPHY N/A 04/14/2019   Procedure: LEFT HEART CATH AND CORONARY ANGIOGRAPHY;  Surgeon: Troy Sine, MD;  Location: [facility] CV LAB;  Service: Cardiovascular;  Laterality: N/A;  . PERIPHERAL VASCULAR BALLOON ANGIOPLASTY Right 01/16/2017   Procedure: PERIPHERAL VASCULAR BALLOON ANGIOPLASTY;  Surgeon: Conrad [place], MD;  Location: [place] CV LAB;  Service: Cardiovascular;  Laterality: Right;  common iliac   . PR VEIN BYPASS GRAFT,AORTO-FEM-POP  06-22-2010   Redo Left Fem-Pop  . Repair of left arm fracture    . 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 SURGERY SCROTAL / TESTICULAR     Social History   Social History Narrative   The patient lives in [place].    Immunization History  Administered Date(s) Administered  . Influenza, High Dose Seasonal PF 10/14/2016, 09/21/2018  . Influenza-Unspecified 11/08/2008  . PPD  Test 09/22/2007  . Tdap 08/05/2018     Objective: Vital Signs: There were no vitals taken for this visit.   Physical Exam   Musculoskeletal Exam: ***  CDAI Exam: CDAI Score: -- Patient Global: --; Provider Global: -- Swollen: --; Tender: -- Joint Exam 06/28/2019   No joint exam has been documented for this visit   There is currently no information documented on the homunculus. Go to the Rheumatology activity and complete the homunculus joint exam.  Investigation: No additional findings.  Imaging: No results found.  Recent Labs: Lab Results  Component Value Date   WBC 5.8 04/13/2019   HGB 12.3 (L) 04/13/2019   PLT 283 04/13/2019   NA 140 04/13/2019   K 4.1 04/13/2019   CL 105 04/13/2019   CO2 27 04/13/2019   GLUCOSE 184 (H) 04/13/2019   BUN 14 04/13/2019   CREATININE 0.88 04/13/2019   BILITOT 0.2 01/15/2019   ALKPHOS 78 01/15/2019   AST 25 01/15/2019   ALT 23 01/15/2019   PROT 6.9 01/15/2019   ALBUMIN 4.3 01/15/2019   CALCIUM 9.3 04/13/2019   GFRAA >60 04/13/2019  QFTBGOLDPLUS NEGATIVE 08/06/2018    Speciality Comments: Prior therapy includes: methotrexate ( d/c alcohol use), Arava (d/c patient preference), [place] (patient declined)  Procedures:  No procedures performed Allergies: Patient has no known allergies.   Assessment / Plan:     Visit Diagnoses: No diagnosis found.  Orders: No orders of the defined types were placed in this encounter.  No orders of the defined types were placed in this encounter.   Face-to-face time spent with patient was *** minutes. Greater than 50% of time was spent in counseling and coordination of care.  Follow-Up Instructions: No follow-ups on file.   Earnestine Mealing, CMA  Note - This record has been created using Editor, commissioning.  Chart creation errors have been sought, but may not always  have been located. Such creation errors do not reflect on  the standard of medical care.

## 2019-06-18 ENCOUNTER — Other Ambulatory Visit: Payer: Self-pay | Admitting: *Deleted

## 2019-06-18 ENCOUNTER — Telehealth: Payer: Self-pay | Admitting: Internal Medicine

## 2019-06-18 NOTE — Telephone Encounter (Signed)
Try symbicort 80 2bid and have him call if losing ground prior to f/u ov already scheduled

## 2019-06-18 NOTE — Telephone Encounter (Signed)
Received a request from patient's pharmacy for a refill of Symbicort 160-4.5.  While attempting to refill, received a message that the medication was not on the preferred list.  Message sent to Dr. Melvyn Novas asking if he wanted to start a prior auth on the symbicort or change to one of the preferred meds.  Preferred list is: Symbicort 80-4.5 Advair 100-50, 250-50, 500-50 Breo 100-25, 200-25 Dulera 100-5, 200-5, 50-5 Fluticasone-Salmeterol 100-50, 250-50, 500-50, 55-14, 113-14, 232-14  Will await a response from Dr. Melvyn Novas.

## 2019-06-23 DIAGNOSIS — E1165 Type 2 diabetes mellitus with hyperglycemia: Secondary | ICD-10-CM | POA: Diagnosis not present

## 2019-06-23 DIAGNOSIS — E1122 Type 2 diabetes mellitus with diabetic chronic kidney disease: Secondary | ICD-10-CM | POA: Diagnosis not present

## 2019-06-23 DIAGNOSIS — E1142 Type 2 diabetes mellitus with diabetic polyneuropathy: Secondary | ICD-10-CM | POA: Diagnosis not present

## 2019-06-23 DIAGNOSIS — N183 Chronic kidney disease, stage 3 unspecified: Secondary | ICD-10-CM | POA: Diagnosis not present

## 2019-06-23 DIAGNOSIS — I10 Essential (primary) hypertension: Secondary | ICD-10-CM | POA: Diagnosis not present

## 2019-06-23 DIAGNOSIS — Z299 Encounter for prophylactic measures, unspecified: Secondary | ICD-10-CM | POA: Diagnosis not present

## 2019-06-28 ENCOUNTER — Other Ambulatory Visit: Payer: Self-pay | Admitting: Internal Medicine

## 2019-06-28 ENCOUNTER — Ambulatory Visit: Payer: Medicare Other | Admitting: Physician Assistant

## 2019-06-28 MED ORDER — BUDESONIDE-FORMOTEROL FUMARATE 160-4.5 MCG/ACT IN AERO: INHALATION_SPRAY | RESPIRATORY_TRACT | 5 refills | Status: DC

## 2019-07-01 NOTE — Progress Notes (Deleted)
Office Visit Note  Patient: [name]             Date of Birth: Sep 26, 1950           MRN: [id number]             PCP: [name], MD Referring: [name], MD Visit Date: 07/05/2019 Occupation: @GUAROCC @  Subjective:  No chief complaint on file.   History of Present Illness: [name] is a 69 y.o. male ***   Activities of Daily Living:  Patient reports morning stiffness for *** {minute/hour:19697}.   Patient {ACTIONS;DENIES/REPORTS:21021675::"Denies"} nocturnal pain.  Difficulty dressing/grooming: {ACTIONS;DENIES/REPORTS:21021675::"Denies"} Difficulty climbing stairs: {ACTIONS;DENIES/REPORTS:21021675::"Denies"} Difficulty getting out of chair: {ACTIONS;DENIES/REPORTS:21021675::"Denies"} Difficulty using hands for taps, buttons, cutlery, and/or writing: {ACTIONS;DENIES/REPORTS:21021675::"Denies"}  No Rheumatology ROS completed.   PMFS History:  Patient Active Problem List   Diagnosis Date Noted  . Puncture wound of finger of left hand 08/08/2018  . Puncture wound of finger, right, complicated, initial encounter 08/05/2018  . DOE (dyspnea on exertion) 11/04/2017  . Muscle cramps 07/04/2017  . Contracture of left elbow 07/04/2017  . High risk medication use 06/23/2017  . Primary osteoarthritis of both hands 06/23/2017  . Primary osteoarthritis of both feet 06/23/2017  . 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 factor ([place]) 06/23/2017  . DDD (degenerative disc disease), cervical 06/23/2017  . Rotator cuff syndrome of right shoulder 06/06/2017  . History of  bilateral carpal tunnel release 06/06/2017  . Ganglion cyst of wrist, left 06/06/2017  . Non-insulin dependent type 2 diabetes mellitus ([place]) 06/06/2017  . Chest pain with high risk for cardiac etiology 04/05/2017  . COPD with acute exacerbation ([place]) 03/13/2017  . Plantar fasciitis 02/21/2017  . Iliac artery stenosis, right ([place]) 02/12/2017  . Hematochezia 02/10/2017  . Abnormal  CT of the abdomen 02/10/2017  . Cervical spondylosis 10/11/2016  . Bony growth 10/13/2015  . Pain in joint, lower leg 03/02/2014  . Discoloration of skin-Left dorsum foot 09/28/2013  . Swelling of limb-Left Calf / Leg 09/28/2013  . PVD (peripheral vascular disease) ([place]) 10/14/2012  . Pain in limb- Left popliteal and calf 10/14/2012  . Dyslipidemia, goal LDL below 70   . Essential hypertension 03/05/2012  . 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 Generalized osteoarthritis of multiple sites 02/06/2011  . History of arterial bypass of lower limb 12/25/2010  . Atherosclerosis of native arteries of extremity with intermittent claudication ([place]) 12/25/2010  . Coronary artery calcification seen on CT scan 04/16/2008  . COPD  GOLD II 04/16/2008  . GASTROESOPHAGEAL REFLUX DISEASE 04/16/2008  . Rheumatoid arthritis ([place]) 04/16/2008  . DIABETES MELLITUS, BORDERLINE 04/16/2008    Past Medical History:  Diagnosis Date  . Arthritis    RA  . Bronchitis   . Carotid artery occlusion   . Cervical spine disease    Neck surgery in 2007 with Dr. Joya Salm  . COPD (chronic obstructive pulmonary disease) ([place])   . Coronary atherosclerosis of native coronary artery    Nonobstrtuctive by catheterization 2007, 2013, 2021  . DVT (deep venous thrombosis) ([place])   . Essential hypertension   . GERD (gastroesophageal reflux disease)   . Hyperlipidemia   . PAD (peripheral artery disease) ([place])    s/p left fem-pop bypass 2007; PTA and stenting of right iliac artery 09/2011  . Rheumatoid arthritis(714.0)   . Type 2 diabetes mellitus (H[facility])     Family History  Problem Relation Age of Onset  . Heart attack Mother 66  . Deep vein  thrombosis Mother   . Diabetes Mother   . Hyperlipidemia Mother   . Hypertension Mother   . Peripheral vascular disease Mother   . Stroke Father   . Diabetes Father   . Hyperlipidemia Father   . Hypertension Father   . Peripheral vascular disease Father   . Diabetes  Sister   . Hyperlipidemia Sister   . Hypertension Sister   . Diabetes Brother   . Hyperlipidemia Brother   . Cancer Brother   . Deep vein thrombosis Brother   . Hypertension Brother   . Peripheral vascular disease Brother   . Hyperlipidemia Son   . Cancer Son   . Hypertension Son   . Colon cancer Neg Hx    Past Surgical History:  Procedure Laterality Date  . ABDOMINAL AORTAGRAM N/A 10/03/2011   Procedure: ABDOMINAL Maxcine Ham;  Surgeon: Conrad Industry, MD;  Location: [facility] CATH LAB;  Service: Cardiovascular;  Laterality: N/A;  . ABDOMINAL AORTOGRAM W/LOWER EXTREMITY N/A 01/16/2017   Procedure: ABDOMINAL AORTOGRAM W/LOWER EXTREMITY;  Surgeon: Conrad [place], MD;  Location: [facility] CV LAB;  Service: Cardiovascular;  Laterality: N/A;  . CERVICAL DISC SURGERY    . COLONOSCOPY WITH PROPOFOL N/A 02/17/2017   Procedure: COLONOSCOPY WITH PROPOFOL;  Surgeon: Daneil Dolin, MD;  Location: [facility] ENDO SU[facility];  Service: Endoscopy;  Laterality: N/A;  2:45pm  . FEMORAL-POPLITEAL BYPASS GRAFT  11/06/2005  . ILIAC ARTERY STENT  10-03-11   Right CIA stenting  . LEFT HEART CATH AND CORONARY ANGIOGRAPHY N/A 04/14/2019   Procedure: LEFT HEART CATH AND CORONARY ANGIOGRAPHY;  Surgeon: Troy Sine, MD;  Location: [facility] CV LAB;  Service: Cardiovascular;  Laterality: N/A;  . PERIPHERAL VASCULAR BALLOON ANGIOPLASTY Right 01/16/2017   Procedure: PERIPHERAL VASCULAR BALLOON ANGIOPLASTY;  Surgeon: Conrad Westover, MD;  Location: [facility] CV LAB;  Service: Cardiovascular;  Laterality: Right;  common iliac   . PR VEIN BYPASS GRAFT,AORTO-FEM-POP  06-22-2010   Redo Left Fem-Pop  . Repair of left arm fracture    . 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 SURGERY SCROTAL / TESTICULAR     Social History   Social History Narrative   The patient lives in [place].    Immunization History  Administered Date(s) Administered  . Influenza, High Dose Seasonal PF 10/14/2016, 09/21/2018  . Influenza-Unspecified 11/08/2008  . PPD  Test 09/22/2007  . Tdap 08/05/2018     Objective: Vital Signs: There were no vitals taken for this visit.   Physical Exam   Musculoskeletal Exam: ***  CDAI Exam: CDAI Score: -- Patient Global: --; Provider Global: -- Swollen: --; Tender: -- Joint Exam 07/05/2019   No joint exam has been documented for this visit   There is currently no information documented on the homunculus. Go to the Rheumatology activity and complete the homunculus joint exam.  Investigation: No additional findings.  Imaging: No results found.  Recent Labs: Lab Results  Component Value Date   WBC 5.8 04/13/2019   HGB 12.3 (L) 04/13/2019   PLT 283 04/13/2019   NA 140 04/13/2019   K 4.1 04/13/2019   CL 105 04/13/2019   CO2 27 04/13/2019   GLUCOSE 184 (H) 04/13/2019   BUN 14 04/13/2019   CREATININE 0.88 04/13/2019   BILITOT 0.2 01/15/2019   ALKPHOS 78 01/15/2019   AST 25 01/15/2019   ALT 23 01/15/2019   PROT 6.9 01/15/2019   ALBUMIN 4.3 01/15/2019   CALCIUM 9.3 04/13/2019   GFRAA >60 04/13/2019  QFTBGOLDPLUS NEGATIVE 08/06/2018    Speciality Comments: Prior therapy includes: methotrexate ( d/c alcohol use), Arava (d/c patient preference), [place] (patient declined)  Procedures:  No procedures performed Allergies: Patient has no known allergies.   Assessment / Plan:     Visit Diagnoses: No diagnosis found.  Orders: No orders of the defined types were placed in this encounter.  No orders of the defined types were placed in this encounter.   Face-to-face time spent with patient was *** minutes. Greater than 50% of time was spent in counseling and coordination of care.  Follow-Up Instructions: No follow-ups on file.   Earnestine Mealing, CMA  Note - This record has been created using Editor, commissioning.  Chart creation errors have been sought, but may not always  have been located. Such creation errors do not reflect on  the standard of medical care.

## 2019-07-02 ENCOUNTER — Other Ambulatory Visit: Payer: Self-pay

## 2019-07-02 ENCOUNTER — Ambulatory Visit (INDEPENDENT_AMBULATORY_CARE_PROVIDER_SITE_OTHER): Payer: Medicare Other | Admitting: Podiatry

## 2019-07-02 ENCOUNTER — Encounter: Payer: Self-pay | Admitting: Podiatry

## 2019-07-02 VITALS — Temp 96.2°F

## 2019-07-02 DIAGNOSIS — M79675 Pain in left toe(s): Secondary | ICD-10-CM

## 2019-07-02 DIAGNOSIS — B351 Tinea unguium: Secondary | ICD-10-CM

## 2019-07-02 DIAGNOSIS — E1159 Type 2 diabetes mellitus with other circulatory complications: Secondary | ICD-10-CM | POA: Diagnosis not present

## 2019-07-02 DIAGNOSIS — M79674 Pain in right toe(s): Secondary | ICD-10-CM | POA: Diagnosis not present

## 2019-07-02 DIAGNOSIS — L84 Corns and callosities: Secondary | ICD-10-CM

## 2019-07-02 NOTE — Patient Instructions (Signed)
Diabetes Mellitus and Foot Care Foot care is an important part of your health, especially when you have diabetes. Diabetes may cause you to have problems because of poor blood flow (circulation) to your feet and legs, which can cause your skin to:  Become thinner and drier.  Break more easily.  Heal more slowly.  Peel and crack. You may also have nerve damage (neuropathy) in your legs and feet, causing decreased feeling in them. This means that you may not notice minor injuries to your feet that could lead to more serious problems. Noticing and addressing any potential problems early is the best way to prevent future foot problems. How to care for your feet Foot hygiene  Wash your feet daily with warm water and mild soap. Do not use hot water. Then, pat your feet and the areas between your toes until they are completely dry. Do not soak your feet as this can dry your skin.  Trim your toenails straight across. Do not dig under them or around the cuticle. File the edges of your nails with an emery board or nail file.  Apply a moisturizing lotion or petroleum jelly to the skin on your feet and to dry, brittle toenails. Use lotion that does not contain alcohol and is unscented. Do not apply lotion between your toes. Shoes and socks  Wear clean socks or stockings every day. Make sure they are not too tight. Do not wear knee-high stockings since they may decrease blood flow to your legs.  Wear shoes that fit properly and have enough cushioning. Always look in your shoes before you put them on to be sure there are no objects inside.  To break in new shoes, wear them for just a few hours a day. This prevents injuries on your feet. Wounds, scrapes, corns, and calluses  Check your feet daily for blisters, cuts, bruises, sores, and redness. If you cannot see the bottom of your feet, use a mirror or ask someone for help.  Do not cut corns or calluses or try to remove them with medicine.  If you  find a minor scrape, cut, or break in the skin on your feet, keep it and the skin around it clean and dry. You may clean these areas with mild soap and water. Do not clean the area with peroxide, alcohol, or iodine.  If you have a wound, scrape, corn, or callus on your foot, look at it several times a day to make sure it is healing and not infected. Check for: ? Redness, swelling, or pain. ? Fluid or blood. ? Warmth. ? Pus or a bad smell. General instructions  Do not cross your legs. This may decrease blood flow to your feet.  Do not use heating pads or hot water bottles on your feet. They may burn your skin. If you have lost feeling in your feet or legs, you may not know this is happening until it is too late.  Protect your feet from hot and cold by wearing shoes, such as at the beach or on hot pavement.  Schedule a complete foot exam at least once a year (annually) or more often if you have foot problems. If you have foot problems, report any cuts, sores, or bruises to your health care provider immediately. Contact a health care provider if:  You have a medical condition that increases your risk of infection and you have any cuts, sores, or bruises on your feet.  You have an injury that is not   healing.  You have redness on your legs or feet.  You feel burning or tingling in your legs or feet.  You have pain or cramps in your legs and feet.  Your legs or feet are numb.  Your feet always feel cold.  You have pain around a toenail. Get help right away if:  You have a wound, scrape, corn, or callus on your foot and: ? You have pain, swelling, or redness that gets worse. ? You have fluid or blood coming from the wound, scrape, corn, or callus. ? Your wound, scrape, corn, or callus feels warm to the touch. ? You have pus or a bad smell coming from the wound, scrape, corn, or callus. ? You have a fever. ? You have a red line going up your leg. Summary  Check your feet every day  for cuts, sores, red spots, swelling, and blisters.  Moisturize feet and legs daily.  Wear shoes that fit properly and have enough cushioning.  If you have foot problems, report any cuts, sores, or bruises to your health care provider immediately.  Schedule a complete foot exam at least once a year (annually) or more often if you have foot problems. This information is not intended to replace advice given to you by your health care provider. Make sure you discuss any questions you have with your health care provider. Document Revised: 09/16/2018 Document Reviewed: 01/26/2016 Elsevier Patient Education  2020 [employer]  

## 2019-07-02 NOTE — Progress Notes (Signed)
Subjective: [name] presents today at risk foot care. Pt has h/o NIDDM with PAD and painful mycotic nails b/l that are difficult to trim. Pain interferes with ambulation. Aggravating factors include wearing enclosed shoe gear. Pain is relieved with periodic professional debridement.   He states he was admitted to hospital for chest pain in  April. He states he has 30% blockage. No MI was detected. He has nitroglycerin for chest pain.  He is followed closely by Vascular for PAD.  [name], MD is patient's PCP. Last visit was: 02/17/2019.  Past Medical History:  Diagnosis Date  . Arthritis    RA  . Bronchitis   . Carotid artery occlusion   . Cervical spine disease    Neck surgery in 2007 with Dr. Joya Salm  . COPD (chronic obstructive pulmonary disease) ([place])   . Coronary atherosclerosis of native coronary artery    Nonobstrtuctive by catheterization 2007, 2013, 2021  . DVT (deep venous thrombosis) ([place])   . Essential hypertension   . GERD (gastroesophageal reflux disease)   . Hyperlipidemia   . PAD (peripheral artery disease) ([place])    s/p left fem-pop bypass 2007; PTA and stenting of right iliac artery 09/2011  . Rheumatoid arthritis(714.0)   . Type 2 diabetes mellitus ([place])      Current Outpatient Medications on File Prior to Visit  Medication Sig Dispense Refill  . aspirin EC 81 MG tablet Take 1 tablet (81 mg total) by mouth daily.    Marland Kitchen atorvastatin (LIPITOR) 20 MG tablet Take 20 mg by mouth daily.    . budesonide-formoterol (SYMBICORT) 160-4.5 MCG/ACT inhaler INHALE 2 PUFFS BY MOUTH TWICE A DAY RINSE MOUTH AFTER USE. 10.2 g 5  . celecoxib (CELEBREX) 200 MG capsule Take 1 capsule (200 mg total) by mouth daily. (HOLD X 5 DAYS WHILE TAKING THE IBUPROFEN) (Patient taking differently: Take 200 mg by mouth daily. )    . clopidogrel (PLAVIX) 75 MG tablet Take 1 tablet (75 mg total) by mouth daily. 30 tablet 11  . Dexlansoprazole (DEXILANT) 30 MG capsule Take 30- 60 min before  your first and last meals of the day (Patient taking differently: Take 30 mg by mouth daily. ) 60 capsule 11  . diclofenac Sodium (VOLTAREN) 1 % GEL APPLY EXTERNALLY TO THE AFFECTED AREA TWICE DAILY    . docusate sodium (COLACE) 100 MG capsule Take 100 mg by mouth as needed.     Marland Kitchen glipiZIDE (GLUCOTROL) 5 MG tablet Take 5 mg by mouth every morning.     Marland Kitchen HYDROcodone-acetaminophen (NORCO) 7.5-325 MG per tablet Take 1 tablet by mouth 2 (two) times daily as needed for moderate pain.     . isosorbide mononitrate (IMDUR) 30 MG 24 hr tablet Take 1 tablet (30 mg total) by mouth daily. 30 tablet 6  . JANUVIA 100 MG tablet Take 100 mg by mouth daily.     Marland Kitchen ketoconazole (NIZORAL) 2 % cream Apply to both feet once daily for 4 weeks 30 g 0  . losartan (COZAAR) 50 MG tablet Take 2 each am (Patient taking differently: Take 50 mg by mouth daily. Take 2 each am) 60 tablet 11  . metFORMIN (GLUCOPHAGE) 500 MG tablet Take 500 mg by mouth 2 (two) times daily with a meal.     . nitroGLYCERIN (NITROSTAT) 0.4 MG SL tablet Place 1 tablet (0.4 mg total) under the tongue every 5 (five) minutes as needed for chest pain. 25 tablet 3  . ONE TOUCH ULTRA TEST test  strip     . PROAIR HFA 108 (90 BASE) MCG/ACT inhaler Inhale 2 puffs into the lungs every 6 (six) hours as needed for wheezing or shortness of breath.     . Tiotropium Bromide Monohydrate (SPIRIVA RESPIMAT) 2.5 MCG/ACT AERS Inhale 2 puffs into the lungs daily. 4 g 11  . Trolamine Salicylate (ASPERCREME EX) Apply topically as needed.    . vitamin C (ASCORBIC ACID) 500 MG tablet Take 500 mg by mouth daily.    Morrie Sheldon 5 MG TABS Take 1 tablet by mouth daily. 90 tablet 0   No current facility-administered medications on file prior to visit.     No Known Allergies  Objective: [name] is a pleasant 69 y.o. African American male WD, WN in NAD. AAO x 3.  Vitals:   07/02/19 0805  Temp: (!) 96.2 F (35.7 C)    Vascular Examination: Capillary refill time to  digits <4 seconds b/l lower extremities. Nonpalpable DP pulse(s) b/l lower extremities. Nonpalpable PT pulse(s) b/l lower extremities. Pedal hair absent. Lower extremity skin temperature gradient within normal limits. No pain with calf compression b/l. No ischemia or gangrene noted b/l lower extremities.  Dermatological Examination: Pedal skin is thin shiny, atrophic b/l lower extremities. No open wounds bilaterally. No interdigital macerations bilaterally. Toenails 1-5 b/l elongated, discolored, dystrophic, thickened, crumbly with subungual debris and tenderness to dorsal palpation. Hyperkeratotic lesion(s) L hallux.  No erythema, no edema, no drainage, no flocculence.  Musculoskeletal: Normal muscle strength 5/5 to all lower extremity muscle groups bilaterally. No pain crepitus or joint limitation noted with ROM b/l. Hallux valgus with bunion deformity noted b/l lower extremities. Patient ambulates independent of any assistive aids.  Neurological Examination: Protective sensation intact 5/5 intact bilaterally with 10g monofilament b/l. Vibratory sensation intact b/l. Proprioception intact bilaterally. Babinski reflex negative b/l. Clonus negative b/l.  Last A1c: No flowsheet data found.    Assessment: 1. Pain due to onychomycosis of toenails of both feet   2. Callus   3. Type 2 diabetes mellitus with vascular disease ([place])   Plan: -Examined patient. -No new findings. No new orders. -Continue diabetic foot care principles. -Toenails 1-5 b/l were debrided in length and girth with sterile nail nippers and dremel without iatrogenic bleeding.  -Patient to report any pedal injuries to medical professional immediately. -Patient to continue soft, supportive shoe gear daily. -Patient/POA to call should there be question/concern in the interim.  Return in about 3 months (around 10/02/2019).  Marzetta Board, DPM

## 2019-07-05 ENCOUNTER — Telehealth: Payer: Self-pay

## 2019-07-05 ENCOUNTER — Ambulatory Visit: Payer: Medicare Other | Admitting: Physician Assistant

## 2019-07-05 NOTE — Telephone Encounter (Signed)
Triage call from pt reporting numbness in feet and requesting an appt earlier than scheduled currently on 7/20. LM for pt to return call.

## 2019-07-07 DIAGNOSIS — E1165 Type 2 diabetes mellitus with hyperglycemia: Secondary | ICD-10-CM | POA: Diagnosis not present

## 2019-07-07 DIAGNOSIS — E119 Type 2 diabetes mellitus without complications: Secondary | ICD-10-CM | POA: Diagnosis not present

## 2019-07-07 DIAGNOSIS — I10 Essential (primary) hypertension: Secondary | ICD-10-CM | POA: Diagnosis not present

## 2019-07-27 ENCOUNTER — Encounter (HOSPITAL_COMMUNITY): Payer: Medicare Other

## 2019-07-27 ENCOUNTER — Ambulatory Visit: Payer: Medicare Other

## 2019-07-29 ENCOUNTER — Ambulatory Visit (INDEPENDENT_AMBULATORY_CARE_PROVIDER_SITE_OTHER): Payer: Medicare Other | Admitting: Physician Assistant

## 2019-07-29 ENCOUNTER — Other Ambulatory Visit: Payer: Self-pay

## 2019-07-29 ENCOUNTER — Ambulatory Visit (HOSPITAL_COMMUNITY)
Admission: RE | Admit: 2019-07-29 | Discharge: 2019-07-29 | Disposition: A | Discharge status: Home / Self Care | Payer: Medicare Other | Source: Ambulatory Visit | Attending: Physician Assistant | Admitting: Physician Assistant

## 2019-07-29 VITALS — BP 133/83 | HR 79 | Temp 97.5°F | Resp 20 | Ht 70.0 in | Wt 174.5 lb

## 2019-07-29 DIAGNOSIS — I739 Peripheral vascular disease, unspecified: Secondary | ICD-10-CM

## 2019-07-29 DIAGNOSIS — I779 Disorder of arteries and arterioles, unspecified: Secondary | ICD-10-CM

## 2019-07-29 NOTE — Progress Notes (Signed)
Office Note     CC: routine follow up Requesting Provider:  [name], MD  HPI: [name] is a 69 y.o. (1950-06-03) male who presents for routine 3-4 month follow up of PAD.  He was last seen on April 27, 2019 and his study showed patent left femoral to above-knee popliteal bypass.  Noncompressible vessels on the right on exam dated April 26, 2019.  Both lower extremities showed noncompressible vessels bilaterally on his exam dated November 26, 2018.  He was not having symptoms of classic claudication; however he was having some lower extremity complaints of pain with activity.  No rest pain or lower extremity ulcers.   Today he reports inability to complete his usual morning 1 mile walk due to right lower extremity pain.  He states this is similar to how his left leg felt prior to his bypass.   He underwent cardiac catheterization on April 14, 2019 secondary to chest pain and increasing dyspnea on exertion.  Medical therapy was recommended.  He is s/p left common femoral to above knee popliteal bypass with propaten in 2007 by Dr. Kellie Simmering and then a redo left common femoral to above knee popliteal bypass with Propaten in 2012. In addition he has undergone a subintimal angioplasty of his right common iliac artery in 2013 with a drug-coated balloon angioplasty in 2019 by Dr. Bridgett Larsson.    His atheroslcerotic risk factors include uncontrolled DM, 45 year hx of smoking (quit in 2013), CAD, COPD, hyperlipidemia, and hypertension. He sometimes misses his daily aspirin dose, but states he is compliant with his statin and Plavix.  Past Medical History:  Diagnosis Date   Arthritis    RA   Bronchitis    Carotid artery occlusion    Cervical spine disease    Neck surgery in 2007 with Dr. Joya Salm   COPD (chronic obstructive pulmonary disease) ([place])    Coronary atherosclerosis of native coronary artery    Nonobstrtuctive by catheterization 2007, 2013, 2021   DVT (deep venous thrombosis)  ([place])    Essential hypertension    GERD (gastroesophageal reflux disease)    Hyperlipidemia    PAD (peripheral artery disease) ([place])    s/p left fem-pop bypass 2007; PTA and stenting of right iliac artery 09/2011   Rheumatoid arthritis(714.0)    Type 2 diabetes mellitus ([place])     Past Surgical History:  Procedure Laterality Date   ABDOMINAL AORTAGRAM N/A 10/03/2011   Procedure: ABDOMINAL Maxcine Ham;  Surgeon: Conrad Hartshorne, MD;  Location: [facility] CATH LAB;  Service: Cardiovascular;  Laterality: N/A;   ABDOMINAL AORTOGRAM W/LOWER EXTREMITY N/A 01/16/2017   Procedure: ABDOMINAL AORTOGRAM W/LOWER EXTREMITY;  Surgeon: Conrad [place], MD;  Location: [facility] CV LAB;  Service: Cardiovascular;  Laterality: N/A;   CERVICAL DISC SURGERY     COLONOSCOPY WITH PROPOFOL N/A 02/17/2017   Procedure: COLONOSCOPY WITH PROPOFOL;  Surgeon: Daneil Dolin, MD;  Location: [facility]O SU[facility];  Service: Endoscopy;  Laterality: N/A;  2:45pm   FEMORAL-POPLITEAL BYPASS GRAFT  11/06/2005   ILIAC ARTERY STENT  10-03-11   Right CIA stenting   LEFT HEART CATH AND CORONARY ANGIOGRAPHY N/A 04/14/2019   Procedure: LEFT HEART CATH AND CORONARY ANGIOGRAPHY;  Surgeon: Troy Sine, MD;  Location: [facility] CV LAB;  Service: Cardiovascular;  Laterality: N/A;   PERIPHERAL VASCULAR BALLOON ANGIOPLASTY Right 01/16/2017   Procedure: PERIPHERAL VASCULAR BALLOON ANGIOPLASTY;  Surgeon: Conrad [place], MD;  Location: [facility] CV LAB;  Service: Cardiovascular;  Laterality: Right;  common iliac    PR VEIN BYPASS GRAFT,AORTO-FEM-POP  06-22-2010   Redo Left Fem-Pop   Repair of left arm fracture     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     SURGERY SCROTAL / TESTICULAR      Social History   Socioeconomic History   Marital status: Single    Spouse name: Not on file   Number of children: Not on file   Years of education: Not on file   Highest education level: Not on file  Occupational History   Not on file  Tobacco Use     Smoking status: Former Smoker    Packs/day: 1.00    Years: 45.00    Pack years: 45.00    Types: Cigarettes, Cigars    Quit date: 09/01/2011    Years since quitting: 7.9   Smokeless tobacco: Never Used  Vaping Use   Vaping Use: Never used  Substance and Sexual Activity   Alcohol use: No    Alcohol/week: 0.0 standard drinks   Drug use: No   Sexual activity: Not Currently    Birth control/protection: None  Other Topics Concern   Not on file  Social History Narrative   The patient lives in [place].    Social Determinants of Health   Financial Resource Strain:    Difficulty of Paying Living Expenses:   Food Insecurity:    Worried About Charity fundraiser in the Last Year:    Arboriculturist in the Last Year:   Transportation Needs:    Film/video editor (Medical):    Lack of Transportation (Non-Medical):   Physical Activity:    Days of Exercise per Week:    Minutes of Exercise per Session:   Stress:    Feeling of Stress :   Social Connections:    Frequency of Communication with Friends and Family:    Frequency of Social Gatherings with Friends and Family:    Attends Religious Services:    Active Member of Clubs or Organizations:    Attends Music therapist:    Marital Status:   Intimate Partner Violence:    Fear of Current or Ex-Partner:    Emotionally Abused:    Physically Abused:    Sexually Abused:    Family History  Problem Relation Age of Onset   Heart attack Mother 63   Deep vein thrombosis Mother    Diabetes Mother    Hyperlipidemia Mother    Hypertension Mother    Peripheral vascular disease Mother    Stroke Father    Diabetes Father    Hyperlipidemia Father    Hypertension Father    Peripheral vascular disease Father    Diabetes Sister    Hyperlipidemia Sister    Hypertension Sister    Diabetes Brother    Hyperlipidemia Brother    Cancer Brother    Deep vein thrombosis Brother     Hypertension Brother    Peripheral vascular disease Brother    Hyperlipidemia Son    Cancer Son    Hypertension Son    Colon cancer Neg Hx     Current Outpatient Medications  Medication Sig Dispense Refill   aspirin EC 81 MG tablet Take 1 tablet (81 mg total) by mouth daily.     atorvastatin (LIPITOR) 20 MG tablet Take 20 mg by mouth daily.     budesonide-formoterol (SYMBICORT) 160-4.5 MCG/ACT inhaler INHALE 2 PUFFS BY MOUTH TWICE A DAY RINSE MOUTH AFTER USE. 10.2 g 5  celecoxib (CELEBREX) 200 MG capsule Take 1 capsule (200 mg total) by mouth daily. (HOLD X 5 DAYS WHILE TAKING THE IBUPROFEN) (Patient taking differently: Take 200 mg by mouth daily. )     clopidogrel (PLAVIX) 75 MG tablet Take 1 tablet (75 mg total) by mouth daily. 30 tablet 11   Dexlansoprazole (DEXILANT) 30 MG capsule Take 30- 60 min before your first and last meals of the day (Patient taking differently: Take 30 mg by mouth daily. ) 60 capsule 11   diclofenac Sodium (VOLTAREN) 1 % GEL APPLY EXTERNALLY TO THE AFFECTED AREA TWICE DAILY     docusate sodium (COLACE) 100 MG capsule Take 100 mg by mouth as needed.      glipiZIDE (GLUCOTROL) 5 MG tablet Take 5 mg by mouth every morning.      HYDROcodone-acetaminophen (NORCO) 7.5-325 MG per tablet Take 1 tablet by mouth 2 (two) times daily as needed for moderate pain.      isosorbide mononitrate (IMDUR) 30 MG 24 hr tablet Take 1 tablet (30 mg total) by mouth daily. 30 tablet 6   JANUVIA 100 MG tablet Take 100 mg by mouth daily.      ketoconazole (NIZORAL) 2 % cream Apply to both feet once daily for 4 weeks 30 g 0   losartan (COZAAR) 50 MG tablet Take 2 each am (Patient taking differently: Take 50 mg by mouth daily. Take 2 each am) 60 tablet 11   metFORMIN (GLUCOPHAGE) 500 MG tablet Take 500 mg by mouth 2 (two) times daily with a meal.      nitroGLYCERIN (NITROSTAT) 0.4 MG SL tablet Place 1 tablet (0.4 mg total) under the tongue every 5 (five) minutes as needed  for chest pain. 25 tablet 3   ONE TOUCH ULTRA TEST test strip      PROAIR HFA 108 (90 BASE) MCG/ACT inhaler Inhale 2 puffs into the lungs every 6 (six) hours as needed for wheezing or shortness of breath.      Tiotropium Bromide Monohydrate (SPIRIVA RESPIMAT) 2.5 MCG/ACT AERS Inhale 2 puffs into the lungs daily. 4 g 11   Trolamine Salicylate (ASPERCREME EX) Apply topically as needed.     vitamin C (ASCORBIC ACID) 500 MG tablet Take 500 mg by mouth daily.     XELJANZ 5 MG TABS Take 1 tablet by mouth daily. 90 tablet 0   No current facility-administered medications for this visit.    No Known Allergies   REVIEW OF SYSTEMS:   [X]  denotes positive finding, [ ]  denotes negative finding Cardiac  Comments:  Chest pain or chest pressure:    Shortness of breath upon exertion:    Short of breath when lying flat:    Irregular heart rhythm:        Vascular    Pain in calf, thigh, or hip brought on by ambulation: x   Pain in feet at night that wakes you up from your sleep:     Blood clot in your veins:    Leg swelling:         Pulmonary    Oxygen at home:    Productive cough:     Wheezing:         Neurologic    Sudden weakness in arms or legs:     Sudden numbness in arms or legs:     Sudden onset of difficulty speaking or slurred speech:    Temporary loss of vision in one eye:     Problems with dizziness:  Gastrointestinal    Blood in stool:     Vomited blood:         Genitourinary    Burning when urinating:     Blood in urine:        Psychiatric    Major depression:         Hematologic    Bleeding problems:    Problems with blood clotting too easily:        Skin    Rashes or ulcers:        Constitutional    Fever or chills:      PHYSICAL EXAMINATION:  Vitals:   07/29/19 0935  BP: 133/83  Pulse: 79  Resp: 20  Temp: (!) 97.5 F (36.4 C)  SpO2: 97%   General:  WDWN in NAD; vital signs documented above Gait: Normal, unaided HENT: WNL,  normocephalic Pulmonary: normal non-labored breathing , without Rales, rhonchi,  wheezing Cardiac: regular HR,  Abdomen: soft, NT, no masses Skin: without rashes Vascular Exam/Pulses: Unable to palpate pedal or popliteal pulses bilaterally.  Normal 2+ femoral pulses bilaterally Extremities: without ischemic changes, without Gangrene , without cellulitis; without open wounds;  Musculoskeletal: no muscle wasting or atrophy  Neurologic: A&O X 3;  No focal weakness or paresthesias are detected Psychiatric:  The pt has Normal affect.   Non-Invasive Vascular Imaging:  (11/25/2017 Duplex examination of the patient's right common iliac artery stent revealed patency with biphasic waveforms.)  (Previous ABI on 04/26/19  ABI/TBI Today's ABI Today's TBI Previous ABI Previous TBI Right 1.29 0.63 [place] 0.57 Left [place] 0.61 [place] 0.54 Right TP: 97 Left TP: 91)  07/29/2019:  Summary:  Right: Resting right ankle-brachial index indicates noncompressible right  lower extremity arteries. The right toe-brachial index is abnormal. RT  great toe pressure = 42 mmHg.   Left: Resting left ankle-brachial index indicates noncompressible left  lower extremity arteries. The left toe-brachial index is abnormal. LT  Great toe pressure = 75 mmHg.    ASSESSMENT/PLAN:: 69 y.o. male here for follow up for peripheral arterial disease.  The patient is having right lower extremity claudication.  His ABIs are noncompressible. He has a sharp decline in his right toe pressure as compared to April.  He denies left lower extremity pain.  I discussed the history and the current symptoms with Dr. Oneida Alar.  Recommend arteriogram with right lower extremity runoff.  He has repeat left lower extremity bypass graft duplex in October as previously arranged   Barbie Banner, PA-C [facility] [phone]  Clinic MD:   Oneida Alar

## 2019-08-04 ENCOUNTER — Encounter (HOSPITAL_COMMUNITY)
Admission: RE | Disposition: A | Discharge status: Home / Self Care | Payer: Self-pay | Source: Home / Self Care | Attending: Vascular Surgery

## 2019-08-04 ENCOUNTER — Ambulatory Visit (HOSPITAL_COMMUNITY)
Admission: RE | Admit: 2019-08-04 | Discharge: 2019-08-04 | Disposition: A | Discharge status: Home / Self Care | Payer: Medicare Other | Attending: Vascular Surgery | Admitting: Vascular Surgery

## 2019-08-04 DIAGNOSIS — I70211 Atherosclerosis of native arteries of extremities with intermittent claudication, right leg: Secondary | ICD-10-CM | POA: Diagnosis not present

## 2019-08-04 DIAGNOSIS — Z7902 Long term (current) use of antithrombotics/antiplatelets: Secondary | ICD-10-CM | POA: Insufficient documentation

## 2019-08-04 DIAGNOSIS — I6529 Occlusion and stenosis of unspecified carotid artery: Secondary | ICD-10-CM | POA: Insufficient documentation

## 2019-08-04 DIAGNOSIS — I2510 Atherosclerotic heart disease of native coronary artery without angina pectoris: Secondary | ICD-10-CM | POA: Diagnosis not present

## 2019-08-04 DIAGNOSIS — Z7984 Long term (current) use of oral hypoglycemic drugs: Secondary | ICD-10-CM | POA: Insufficient documentation

## 2019-08-04 DIAGNOSIS — Z7982 Long term (current) use of aspirin: Secondary | ICD-10-CM | POA: Diagnosis not present

## 2019-08-04 DIAGNOSIS — M069 Rheumatoid arthritis, unspecified: Secondary | ICD-10-CM | POA: Insufficient documentation

## 2019-08-04 DIAGNOSIS — Z86718 Personal history of other venous thrombosis and embolism: Secondary | ICD-10-CM | POA: Diagnosis not present

## 2019-08-04 DIAGNOSIS — Z79899 Other long term (current) drug therapy: Secondary | ICD-10-CM | POA: Diagnosis not present

## 2019-08-04 DIAGNOSIS — E1151 Type 2 diabetes mellitus with diabetic peripheral angiopathy without gangrene: Secondary | ICD-10-CM | POA: Diagnosis not present

## 2019-08-04 DIAGNOSIS — K219 Gastro-esophageal reflux disease without esophagitis: Secondary | ICD-10-CM | POA: Insufficient documentation

## 2019-08-04 DIAGNOSIS — I10 Essential (primary) hypertension: Secondary | ICD-10-CM | POA: Diagnosis not present

## 2019-08-04 DIAGNOSIS — J449 Chronic obstructive pulmonary disease, unspecified: Secondary | ICD-10-CM | POA: Insufficient documentation

## 2019-08-04 DIAGNOSIS — Z7951 Long term (current) use of inhaled steroids: Secondary | ICD-10-CM | POA: Insufficient documentation

## 2019-08-04 DIAGNOSIS — Z87891 Personal history of nicotine dependence: Secondary | ICD-10-CM | POA: Diagnosis not present

## 2019-08-04 DIAGNOSIS — E785 Hyperlipidemia, unspecified: Secondary | ICD-10-CM | POA: Diagnosis not present

## 2019-08-04 HISTORY — PX: ABDOMINAL AORTOGRAM W/LOWER EXTREMITY: CATH118223

## 2019-08-04 LAB — GLUCOSE, CAPILLARY: Glucose-Capillary: 141 mg/dL — ABNORMAL HIGH (ref 70–99)

## 2019-08-04 LAB — POCT I-STAT, CHEM 8
BUN: 15 mg/dL (ref 8–23)
Calcium, Ion: 1.27 mmol/L (ref 1.15–1.40)
Chloride: 103 mmol/L (ref 98–111)
Creatinine, Ser: 0.8 mg/dL (ref 0.61–1.24)
Glucose, Bld: 197 mg/dL — ABNORMAL HIGH (ref 70–99)
HCT: 42 % (ref 39.0–52.0)
Hemoglobin: 14.3 g/dL (ref 13.0–17.0)
Potassium: 4.1 mmol/L (ref 3.5–5.1)
Sodium: 143 mmol/L (ref 135–145)
TCO2: 25 mmol/L (ref 22–32)

## 2019-08-04 SURGERY — ABDOMINAL AORTOGRAM W/LOWER EXTREMITY
Anesthesia: LOCAL | Laterality: Bilateral

## 2019-08-04 MED ORDER — HEPARIN (PORCINE) IN NACL 1000-0.9 UT/500ML-% IV SOLN
INTRAVENOUS | Status: AC
  Filled 2019-08-04: qty 1000

## 2019-08-04 MED ORDER — SODIUM CHLORIDE 0.9% FLUSH: 3.0000 mL | Freq: Two times a day (BID) | INTRAVENOUS | Status: DC

## 2019-08-04 MED ORDER — HYDRALAZINE HCL 20 MG/ML IJ SOLN
INTRAMUSCULAR | Status: AC
  Filled 2019-08-04: qty 1

## 2019-08-04 MED ORDER — MIDAZOLAM HCL 2 MG/2ML IJ SOLN
INTRAMUSCULAR | Status: AC
  Filled 2019-08-04: qty 2

## 2019-08-04 MED ORDER — LIDOCAINE HCL (PF) 1 % IJ SOLN
INTRAMUSCULAR | Status: AC
  Filled 2019-08-04: qty 30

## 2019-08-04 MED ORDER — HYDRALAZINE HCL 20 MG/ML IJ SOLN: 5.0000 mg | INTRAMUSCULAR | Status: DC | PRN

## 2019-08-04 MED ORDER — SODIUM CHLORIDE 0.9% FLUSH: 3.0000 mL | INTRAVENOUS | Status: DC | PRN

## 2019-08-04 MED ORDER — HYDRALAZINE HCL 20 MG/ML IJ SOLN
INTRAMUSCULAR | Status: DC | PRN
  Administered 2019-08-04: 10 mg via INTRAVENOUS

## 2019-08-04 MED ORDER — IODIXANOL 320 MG/ML IV SOLN
INTRAVENOUS | Status: DC | PRN
  Administered 2019-08-04: 120 mL

## 2019-08-04 MED ORDER — CILOSTAZOL 100 MG PO TABS: 100.0000 mg | ORAL_TABLET | Freq: Two times a day (BID) | ORAL | 5 refills | Status: DC

## 2019-08-04 MED ORDER — FENTANYL CITRATE (PF) 100 MCG/2ML IJ SOLN
INTRAMUSCULAR | Status: AC
  Filled 2019-08-04: qty 2

## 2019-08-04 MED ORDER — SODIUM CHLORIDE 0.9 % IV SOLN: INTRAVENOUS | Status: DC

## 2019-08-04 MED ORDER — ACETAMINOPHEN 325 MG PO TABS: 650.0000 mg | ORAL_TABLET | ORAL | Status: DC | PRN

## 2019-08-04 MED ORDER — SODIUM CHLORIDE 0.9 % IV SOLN: INTRAVENOUS | Status: AC

## 2019-08-04 MED ORDER — SODIUM CHLORIDE 0.9 % IV SOLN: 250.0000 mL | INTRAVENOUS | Status: DC | PRN

## 2019-08-04 MED ORDER — LABETALOL HCL 5 MG/ML IV SOLN: 10.0000 mg | INTRAVENOUS | Status: DC | PRN

## 2019-08-04 MED ORDER — LIDOCAINE HCL (PF) 1 % IJ SOLN
INTRAMUSCULAR | Status: DC | PRN
  Administered 2019-08-04: 18 mL

## 2019-08-04 MED ORDER — MIDAZOLAM HCL 2 MG/2ML IJ SOLN
INTRAMUSCULAR | Status: DC | PRN
  Administered 2019-08-04: 1 mg via INTRAVENOUS

## 2019-08-04 MED ORDER — HEPARIN (PORCINE) IN NACL 1000-0.9 UT/500ML-% IV SOLN
INTRAVENOUS | Status: DC | PRN
  Administered 2019-08-04 (×2): 500 mL

## 2019-08-04 MED ORDER — FENTANYL CITRATE (PF) 100 MCG/2ML IJ SOLN
INTRAMUSCULAR | Status: DC | PRN
  Administered 2019-08-04: 25 ug via INTRAVENOUS

## 2019-08-04 MED ORDER — ONDANSETRON HCL 4 MG/2ML IJ SOLN: 4.0000 mg | Freq: Four times a day (QID) | INTRAMUSCULAR | Status: DC | PRN

## 2019-08-04 SURGICAL SUPPLY — 10 items
CATH OMNI FLUSH 5F 65CM (CATHETERS) ×1 IMPLANT
KIT MICROPUNCTURE NIT STIFF (SHEATH) ×1 IMPLANT
KIT PV (KITS) ×2 IMPLANT
SHEATH PINNACLE 5F 10CM (SHEATH) ×1 IMPLANT
SHEATH PROBE COVER 6X72 (BAG) ×1 IMPLANT
SYR MEDRAD MARK V 150ML (SYRINGE) ×1 IMPLANT
TRANSDUCER W/STOPCOCK (MISCELLANEOUS) ×2 IMPLANT
TRAY PV CATH (CUSTOM PROCEDURE TRAY) ×2 IMPLANT
TUBING INJECTOR 48 (MISCELLANEOUS) ×1 IMPLANT
WIRE STARTER BENTSON 035X150 (WIRE) ×1 IMPLANT

## 2019-08-04 NOTE — CV Procedure (Signed)
Site area: left femoral artery Site Prior to Removal: level 0 Pressure Applied For: 20 mins Manual: yes Patient Status During Pull:  stable Post Pull Site:  level 0 Post Pull Instructions Given:  yes  Post Pull Pulses Present: dopplerable  Dressing Applied: gauze and tegaderm Bedrest begins @ 1200 Comments: bedrest per physician orders 4 hours.

## 2019-08-04 NOTE — Discharge Instructions (Signed)
Angiogram, Care After This sheet gives you information about how to care for yourself after your procedure. Your health care provider may also give you more specific instructions. If you have problems or questions, contact your health care provider. What can I expect after the procedure? After the procedure, it is common to have bruising and tenderness at the catheter insertion area. Follow these instructions at home: Insertion site care  Follow instructions from your health care provider about how to take care of your insertion site. Make sure you: ? Wash your hands with soap and water before you change your bandage (dressing). If soap and water are not available, use hand sanitizer. ? Change your dressing as told by your health care provider. ? Leave stitches (sutures), skin glue, or adhesive strips in place. These skin closures may need to stay in place for 2 weeks or longer. If adhesive strip edges start to loosen and curl up, you may trim the loose edges. Do not remove adhesive strips completely unless your health care provider tells you to do that.  Do not take baths, swim, or use a hot tub until your health care provider approves.  You may shower 24-48 hours after the procedure or as told by your health care provider. ? Gently wash the site with plain soap and water. ? Pat the area dry with a clean towel. ? Do not rub the site. This may cause bleeding.  Do not apply powder or lotion to the site. Keep the site clean and dry.  Check your insertion site every day for signs of infection. Check for: ? Redness, swelling, or pain. ? Fluid or blood. ? Warmth. ? Pus or a bad smell. Activity  Rest as told by your health care provider, usually for 1-2 days.  Do not lift anything that is heavier than 10 lbs. (4.5 kg) or as told by your health care provider.  Do not drive for 24 hours if you were given a medicine to help you relax (sedative).  Do not drive or use heavy machinery while  taking prescription pain medicine. General instructions   Return to your normal activities as told by your health care provider, usually in about a week. Ask your health care provider what activities are safe for you.  If the catheter site starts bleeding, lie flat and put pressure on the site. If the bleeding does not stop, get help right away. This is a medical emergency.  Drink enough fluid to keep your urine clear or pale yellow. This helps flush the contrast dye from your body.  Take over-the-counter and prescription medicines only as told by your health care provider.  Keep all follow-up visits as told by your health care provider. This is important. Contact a health care provider if:  You have a fever or chills.  You have redness, swelling, or pain around your insertion site.  You have fluid or blood coming from your insertion site.  The insertion site feels warm to the touch.  You have pus or a bad smell coming from your insertion site.  You have bruising around the insertion site.  You notice blood collecting in the tissue around the catheter site (hematoma). The hematoma may be painful to the touch. Get help right away if:  You have severe pain at the catheter insertion area.  The catheter insertion area swells very fast.  The catheter insertion area is bleeding, and the bleeding does not stop when you hold steady pressure on the area.    The area near or just beyond the catheter insertion site becomes pale, cool, tingly, or numb. These symptoms may represent a serious problem that is an emergency. Do not wait to see if the symptoms will go away. Get medical help right away. Call your local emergency services (911 in the [place] Do not drive yourself to the hospital. Summary  After the procedure, it is common to have bruising and tenderness at the catheter insertion area.  After the procedure, it is important to rest and drink plenty of fluids.  Do not take baths,  swim, or use a hot tub until your health care provider says it is okay to do so. You may shower 24-48 hours after the procedure or as told by your health care provider.  If the catheter site starts bleeding, lie flat and put pressure on the site. If the bleeding does not stop, get help right away. This is a medical emergency. This information is not intended to replace advice given to you by your health care provider. Make sure you discuss any questions you have with your health care provider. Document Revised: 12/06/2016 Document Reviewed: 11/29/2015 Elsevier Patient Education  2020 [employer] Inc[employer]  

## 2019-08-04 NOTE — Op Note (Signed)
Patient name: [name] MRN: [id number] DOB: July 24, 1950 Sex: male  08/04/2019 Pre-operative Diagnosis: Right leg claudication (after walking 1 block) Post-operative diagnosis:  Same Surgeon:  Marty Heck, MD Procedure Performed: 1.  Ultrasound-guided access of left common femoral artery 2.  Aortogram including catheter selection of aorta 3.  Bilateral lower extremity arteriogram with runoff 4.  25 minutes of monitored moderate conscious sedation time  Indications: 69 year old male who was recently seen in clinic for right leg short distance claudication.  He previously had a right common iliac stent with multiple angioplasties to keep this patent.  He has also had a left common femoral to above-knee popliteal bypass.  He presents today for planned arteriogram and possible intervention.  Findings:   Aortogram shows patent renal arteries bilaterally with a patent infrarenal aorta that is moderately diseased with no flow-limiting stenosis.  The right common iliac artery stent is widely patent and we did a pullback pressure across this with no pressure difference.  He does have some moderate disease in the left common iliac as well but has a bounding left common femoral pulse and this does not appear flow-limiting.  Right lower extremity arteriogram shows a patent common femoral profunda as well as a patent SFA.  In the mid segment of SFA is one focal 50% stenosis that does not appear flow-limiting and the above and below-knee popliteal artery is widely patent.  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  The peroneal appears to have a proximal occlusion that is short segment and reconstitures.  Dominant runoff is the anterior tibial that does have about a 50% stenosis proximally and appears to occlude just above the ankle.  Small vessel disease in the right foot.  Left lower extremity arteriogram shows a patent common femoral profunda as well as a patent common femoral  to above-knee popliteal bypass that is widely patent.  Below knee popliteal artery patent and he has runoff through the peroneal that has a proximal stenosis about 50%.   Procedure:  The patient was identified in the holding area and taken to room 8.  The patient was then placed supine on the table and prepped and draped in the usual sterile fashion.  A time out was called.  Ultrasound was used to evaluate the left common femoral artery.  It was patent .  A digital ultrasound image was acquired.  A micropuncture needle was used to access the left common femoral artery under ultrasound guidance.  An 018 wire was advanced without resistance and a micropuncture sheath was placed.  The 018 wire was removed and a benson wire was placed.  The micropuncture sheath was exchanged for a 5 french sheath.  An omniflush catheter was advanced over the wire to the level of L-1.  An abdominal angiogram was obtained.  Next the catheter was pulled down and bilateral lower extremity runoff was obtained as noted above.  I did get some iliac shots as well to look at the right common iliac stent that was previously placed by Dr. Bridgett Larsson.  I did cross the right common iliac stent with a Bentson wire and a Omni Flush catheter and got a pullback pressure here and there was no difference in pressure across the stent.  After evaluating the images on the right I did not think the right SFA lesion was flow-limiting and given the fact that he has mostly tibial disease I do not think this would be durable for intervention and he only has  claudication at this time.  I am recommending medical management for now.  That point time wires and catheters were removed.  He will be taken to holding have the sheath removed.  Plan: Patient is a claudicant and is walking 1 block right now.  No critical limb ischemia at this time.  I do not think intervention on his right tibial disease is going to be durable and I would reserve intervention for if and when  he develops critical limb ischemia.  Ultimately recommending medical management at this time and starting him on pletal with aspirin statin and walking therapy.  Will arrange follow-up in one month.    Marty Heck, MD [facility] [place] Office: [phone]

## 2019-08-04 NOTE — H&P (Signed)
History and Physical Interval Note:  08/04/2019 10:16 AM  [name]  has presented today for surgery, with the diagnosis of Claudication.  The various methods of treatment have been discussed with the patient and family. After consideration of risks, benefits and other options for treatment, the patient has consented to  Procedure(s): ABDOMINAL AORTOGRAM W/LOWER EXTREMITY (Right) as a surgical intervention.  The patient's history has been reviewed, patient examined, no change in status, stable for surgery.  I have reviewed the patient's chart and labs.  Questions were answered to the patient's satisfaction.    Aortogram, lower extremity arteriogram.  Right leg short distance claudication.  Also wants his left leg evaluated and had previous bypass in left leg.  Marty Heck  CC: routine follow up Requesting Provider:  [name], MD  HPI: [name] is a 69 y.o. (12/14/1950) male who presents for routine 3-4 month follow up of PAD.  He was last seen on April 27, 2019 and his study showed patent left femoral to above-knee popliteal bypass.  Noncompressible vessels on the right on exam dated April 26, 2019.  Both lower extremities showed noncompressible vessels bilaterally on his exam dated November 26, 2018.  He was not having symptoms of classic claudication; however he was having some lower extremity complaints of pain with activity.  No rest pain or lower extremity ulcers.   Today he reports inability to complete his usual morning 1 mile walk due to right lower extremity pain.  He states this is similar to how his left leg felt prior to his bypass.   He underwent cardiac catheterization on April 14, 2019 secondary to chest pain and increasing dyspnea on exertion.  Medical therapy was recommended.  He iss/p left common femoral toabove kneepoplitealbypass with propaten in 2007 by Dr. Kellie Simmering and then a redo left common femoral to above kneepoplitealbypass with Propaten in  2012. In addition he has undergone a subintimal angioplasty of his right common iliac artery in 2013 with a drug-coated balloon angioplasty in 2019 by Dr. Bridgett Larsson.    His atheroslcerotic risk factors include uncontrolled DM, 45 year hx of smoking (quit in 2013), CAD, COPD,hyperlipidemia,and hypertension. He sometimes misses his daily aspirin dose, but states he is compliant with his statin and Plavix.      Past Medical History:  Diagnosis Date  . Arthritis    RA  . Bronchitis   . Carotid artery occlusion   . Cervical spine disease    Neck surgery in 2007 with Dr. Joya Salm  . COPD (chronic obstructive pulmonary disease) ([place])   . Coronary atherosclerosis of native coronary artery    Nonobstrtuctive by catheterization 2007, 2013, 2021  . DVT (deep venous thrombosis) ([place])   . Essential hypertension   . GERD (gastroesophageal reflux disease)   . Hyperlipidemia   . PAD (peripheral artery disease) ([place])    s/p left fem-pop bypass 2007; PTA and stenting of right iliac artery 09/2011  . Rheumatoid arthritis(714.0)   . Type 2 diabetes mellitus ([place])          Past Surgical History:  Procedure Laterality Date  . ABDOMINAL AORTAGRAM N/A 10/03/2011   Procedure: ABDOMINAL Maxcine Ham;  Surgeon: Conrad [place], MD;  Location: [facility] CATH LAB;  Service: Cardiovascular;  Laterality: N/A;  . ABDOMINAL AORTOGRAM W/LOWER EXTREMITY N/A 01/16/2017   Procedure: ABDOMINAL AORTOGRAM W/LOWER EXTREMITY;  Surgeon: Conrad Nobles, MD;  Location: [place] CV LAB;  Service: Cardiovascular;  Laterality: N/A;  . CERVICAL DISC SURGERY    .  COLONOSCOPY WITH PROPOFOL N/A 02/17/2017   Procedure: COLONOSCOPY WITH PROPOFOL;  Surgeon: Daneil Dolin, MD;  Location: [facility];  Service: Endoscopy;  Laterality: N/A;  2:45pm  . FEMORAL-POPLITEAL BYPASS GRAFT  11/06/2005  . ILIAC ARTERY STENT  10-03-11   Right CIA stenting  . LEFT HEART CATH AND CORONARY ANGIOGRAPHY N/A 04/14/2019   Procedure:  LEFT HEART CATH AND CORONARY ANGIOGRAPHY;  Surgeon: Troy Sine, MD;  Location: [facility] CV LAB;  Service: Cardiovascular;  Laterality: N/A;  . PERIPHERAL VASCULAR BALLOON ANGIOPLASTY Right 01/16/2017   Procedure: PERIPHERAL VASCULAR BALLOON ANGIOPLASTY;  Surgeon: Conrad Corning, MD;  Location: [facility] CV LAB;  Service: Cardiovascular;  Laterality: Right;  common iliac   . PR VEIN BYPASS GRAFT,AORTO-FEM-POP  06-22-2010   Redo Left Fem-Pop  . Repair of left arm fracture    . 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 SURGERY SCROTAL / TESTICULAR      Social History        Socioeconomic History  . Marital status: Single    Spouse name: Not on file  . Number of children: Not on file  . Years of education: Not on file  . Highest education level: Not on file  Occupational History  . Not on file  Tobacco Use  . Smoking status: Former Smoker    Packs/day: 1.00    Years: 45.00    Pack years: 45.00    Types: Cigarettes, Cigars    Quit date: 09/01/2011    Years since quitting: 7.9  . Smokeless tobacco: Never Used  Vaping Use  . Vaping Use: Never used  Substance and Sexual Activity  . Alcohol use: No    Alcohol/week: 0.0 standard drinks  . Drug use: No  . Sexual activity: Not Currently    Birth control/protection: None  Other Topics Concern  . Not on file  Social History Narrative   The patient lives in [place].    Social Determinants of Health      Financial Resource Strain:   . Difficulty of Paying Living Expenses:   Food Insecurity:   . Worried About Charity fundraiser in the Last Year:   . Arboriculturist in the Last Year:   Transportation Needs:   . Film/video editor (Medical):   Marland Kitchen Lack of Transportation (Non-Medical):   Physical Activity:   . Days of Exercise per Week:   . Minutes of Exercise per Session:   Stress:   . Feeling of Stress :   Social Connections:   . Frequency of Communication with Friends and Family:   .  Frequency of Social Gatherings with Friends and Family:   . Attends Religious Services:   . Active Member of Clubs or Organizations:   . Attends Archivist Meetings:   Marland Kitchen Marital Status:   Intimate Partner Violence:   . Fear of Current or Ex-Partner:   . Emotionally Abused:   Marland Kitchen Physically Abused:   . Sexually Abused:         Family History  Problem Relation Age of Onset  . Heart attack Mother 32  . Deep vein thrombosis Mother   . Diabetes Mother   . Hyperlipidemia Mother   . Hypertension Mother   . Peripheral vascular disease Mother   . Stroke Father   . Diabetes Father   . Hyperlipidemia Father   . Hypertension Father   . Peripheral vascular disease Father   . Diabetes Sister   .  Hyperlipidemia Sister   . Hypertension Sister   . Diabetes Brother   . Hyperlipidemia Brother   . Cancer Brother   . Deep vein thrombosis Brother   . Hypertension Brother   . Peripheral vascular disease Brother   . Hyperlipidemia Son   . Cancer Son   . Hypertension Son   . Colon cancer Neg Hx           Current Outpatient Medications  Medication Sig Dispense Refill  . aspirin EC 81 MG tablet Take 1 tablet (81 mg total) by mouth daily.    Marland Kitchen atorvastatin (LIPITOR) 20 MG tablet Take 20 mg by mouth daily.    . budesonide-formoterol (SYMBICORT) 160-4.5 MCG/ACT inhaler INHALE 2 PUFFS BY MOUTH TWICE A DAY RINSE MOUTH AFTER USE. 10.2 g 5  . celecoxib (CELEBREX) 200 MG capsule Take 1 capsule (200 mg total) by mouth daily. (HOLD X 5 DAYS WHILE TAKING THE IBUPROFEN) (Patient taking differently: Take 200 mg by mouth daily. )    . clopidogrel (PLAVIX) 75 MG tablet Take 1 tablet (75 mg total) by mouth daily. 30 tablet 11  . Dexlansoprazole (DEXILANT) 30 MG capsule Take 30- 60 min before your first and last meals of the day (Patient taking differently: Take 30 mg by mouth daily. ) 60 capsule 11  . diclofenac Sodium (VOLTAREN) 1 % GEL APPLY EXTERNALLY TO THE AFFECTED  AREA TWICE DAILY    . docusate sodium (COLACE) 100 MG capsule Take 100 mg by mouth as needed.     Marland Kitchen glipiZIDE (GLUCOTROL) 5 MG tablet Take 5 mg by mouth every morning.     Marland Kitchen HYDROcodone-acetaminophen (NORCO) 7.5-325 MG per tablet Take 1 tablet by mouth 2 (two) times daily as needed for moderate pain.     . isosorbide mononitrate (IMDUR) 30 MG 24 hr tablet Take 1 tablet (30 mg total) by mouth daily. 30 tablet 6  . JANUVIA 100 MG tablet Take 100 mg by mouth daily.     Marland Kitchen ketoconazole (NIZORAL) 2 % cream Apply to both feet once daily for 4 weeks 30 g 0  . losartan (COZAAR) 50 MG tablet Take 2 each am (Patient taking differently: Take 50 mg by mouth daily. Take 2 each am) 60 tablet 11  . metFORMIN (GLUCOPHAGE) 500 MG tablet Take 500 mg by mouth 2 (two) times daily with a meal.     . nitroGLYCERIN (NITROSTAT) 0.4 MG SL tablet Place 1 tablet (0.4 mg total) under the tongue every 5 (five) minutes as needed for chest pain. 25 tablet 3  . ONE TOUCH ULTRA TEST test strip     . PROAIR HFA 108 (90 BASE) MCG/ACT inhaler Inhale 2 puffs into the lungs every 6 (six) hours as needed for wheezing or shortness of breath.     . Tiotropium Bromide Monohydrate (SPIRIVA RESPIMAT) 2.5 MCG/ACT AERS Inhale 2 puffs into the lungs daily. 4 g 11  . Trolamine Salicylate (ASPERCREME EX) Apply topically as needed.    . vitamin C (ASCORBIC ACID) 500 MG tablet Take 500 mg by mouth daily.    [name] 5 MG TABS Take 1 tablet by mouth daily. 90 tablet 0   No current facility-administered medications for this visit.    No Known Allergies   REVIEW OF SYSTEMS:   [X]  denotes positive finding, [ ]  denotes negative finding Cardiac  Comments:  Chest pain or chest pressure:    Shortness of breath upon exertion:    Short of breath when lying flat:  Irregular heart rhythm:        Vascular    Pain in calf, thigh, or hip brought on by ambulation: x   Pain in feet at night that wakes you  up from your sleep:     Blood clot in your veins:    Leg swelling:         Pulmonary    Oxygen at home:    Productive cough:     Wheezing:         Neurologic    Sudden weakness in arms or legs:     Sudden numbness in arms or legs:     Sudden onset of difficulty speaking or slurred speech:    Temporary loss of vision in one eye:     Problems with dizziness:         Gastrointestinal    Blood in stool:     Vomited blood:         Genitourinary    Burning when urinating:     Blood in urine:        Psychiatric    Major depression:         Hematologic    Bleeding problems:    Problems with blood clotting too easily:        Skin    Rashes or ulcers:        Constitutional    Fever or chills:      PHYSICAL EXAMINATION:     Vitals:   07/29/19 0935  BP: 133/83  Pulse: 79  Resp: 20  Temp: (!) 97.5 F (36.4 C)  SpO2: 97%   General:  WDWN in NAD; vital signs documented above Gait: Normal, unaided HENT: WNL, normocephalic Pulmonary: normal non-labored breathing , without Rales, rhonchi,  wheezing Cardiac: regular HR,  Abdomen: soft, NT, no masses Skin: without rashes Vascular Exam/Pulses: Unable to palpate pedal or popliteal pulses bilaterally.  Normal 2+ femoral pulses bilaterally Extremities: without ischemic changes, without Gangrene , without cellulitis; without open wounds;  Musculoskeletal: no muscle wasting or atrophy       Neurologic: A&O X 3;  No focal weakness or paresthesias are detected Psychiatric:  The pt has Normal affect.   Non-Invasive Vascular Imaging:  (11/25/2017 Duplex examination of the patient's right common iliac artery stent revealed patency with biphasic waveforms.)  (Previous ABI on 04/26/19  ABI/TBI Today's ABI Today's TBI Previous ABI Previous TBI Right 1.29 0.63 [place] 0.57 Left [place] 0.61 [place] 0.54 Right TP: 97 Left TP: 91)  07/29/2019:  Summary:   Right: Resting right ankle-brachial index indicates noncompressible right  lower extremity arteries. The right toe-brachial index is abnormal. RT  great toe pressure = 42 mmHg.   Left: Resting left ankle-brachial index indicates noncompressible left  lower extremity arteries. The left toe-brachial index is abnormal. LT  Great toe pressure = 75 mmHg.    ASSESSMENT/PLAN:: 69 y.o. male here for follow up for peripheral arterial disease.  The patient is having right lower extremity claudication.  His ABIs are noncompressible. He has a sharp decline in his right toe pressure as compared to April.  He denies left lower extremity pain.  I discussed the history and the current symptoms with Dr. Oneida Alar.  Recommend arteriogram with right lower extremity runoff.  He has repeat left lower extremity bypass graft duplex in October as previously arranged   Barbie Banner, PA-C [facility] [phone]  Clinic MD:   Oneida Alar

## 2019-08-04 NOTE — Progress Notes (Signed)
Pt wants to wait for Dr Carlis Abbott to sign consent form

## 2019-08-05 ENCOUNTER — Encounter (HOSPITAL_COMMUNITY): Payer: Self-pay | Admitting: Vascular Surgery

## 2019-08-06 DIAGNOSIS — E119 Type 2 diabetes mellitus without complications: Secondary | ICD-10-CM | POA: Diagnosis not present

## 2019-08-06 DIAGNOSIS — E1165 Type 2 diabetes mellitus with hyperglycemia: Secondary | ICD-10-CM | POA: Diagnosis not present

## 2019-08-06 DIAGNOSIS — I10 Essential (primary) hypertension: Secondary | ICD-10-CM | POA: Diagnosis not present

## 2019-08-09 ENCOUNTER — Ambulatory Visit: Payer: Medicare Other | Admitting: Internal Medicine

## 2019-08-09 NOTE — Progress Notes (Deleted)
Subjective:    Patient ID: [name], male   DOB: [birth year]    MRN: [id number]    Brief patient profile:  14 yobm quit smoking 2013 with dx of copd GOLD II with reversibility on prn inhalers didn't really use it much and "worse over the years" and placed on BREO but worse fall  2018 so placed on Trelegy and referred to pulmonary clinic 01/28/2017 by Dr   [name]      History of Present Illness  01/28/2017 1st [facility] office visit/ Reyansh Kushnir  ? Copd   Chief Complaint  Patient presents with  . Pulmonary Consult    Referred by Dr. [name] for eval of COPD. Pt states that he has been having SOB for the past several yrs, worse for the past 3-6 months. He gets SOB with walking short distances and up and down stairs. He has CP that comes and goes. He also has some cough with white sputum.  He is using proair once daily on average.   doe x years much worse x sev months, cp is not related to breathing or exertion localized L ant chest/ lasts just a few secs Cough is worse since sob worse/ esp hs and early in am Baseline = 15 min walk outside mostly flat but struggles if gets in a hurry or steps  Thinks proair helps his cp but note resolves in 2 secs whether uses proair or not rec Continue the dexilant but for now take it Take 30- 60 min before your first and last meals of the day until cough and breathing better then just Take 30-60 min before first meal of the day  Stop Trelegy and lisinopril Losartan 50 mg only daily takes the place of lisinopril  Plan A = Automatic = symbicort 160 Take 2 puffs first thing in am and then another 2 puffs about 12 hours later.  Work on inhaler technique:   Plan B = Backup Only use your albuterol as a rescue medication   05/20/2018 acute extended  ov/[facility] [place] re: copd/ recurrent cp x one month/ did not bring meds as requested   Chief Complaint  Patient presents with  . Acute Visit    Pt c/o left side CP and increased SOB x 1 month. He has had muscle aches for  the past 6 wks.   Dyspnea:  Walks around park x 15 min daily never tries inhaler before walks, sob stops him and legs hurt but no cp with exertion  Cough: no Sleeping: on side bed is flat two pillows SABA use: bid avg  02: none  Cp comes and goes x years 2-3 min, always in same place / never supine/ did not follow IBS recs rec Plan A = Automatic = symbicort 160 Take 2 puffs first thing in am and then another 2 puffs about 12 hours later.  Add spiriva 2 pffs each am only  Plan B = Backup Only use your albuterol as a rescue medication  Classic   pain pattern suggests ibs rx citrucel/diet    Please schedule a follow up office visit in  6 weeks, sooner if needed  with all medications /inhalers/ solutions in hand so we can verify exactly what you are taking. This includes all medications from all doctors and over the counters   02/09/2019  f/u ov/Ervin Rothbauer re:  GOLD II/ brought meds ./ symb/spiriva maint  Chief Complaint  Patient presents with  . Follow-up    Breathing is about the same. He rarely  uses his albuterol inhaler. No new co's.    Dyspnea:  MMRC2 = can't walk a nl pace on a flat grade s sob but does fine slow and flat / does stationery bike stops p 5 minutes sets resistance high  Cough: no Sleeping: ok SABA use: rare 02: none  rec [employer] is 30 minutes of exercise dialy   - add resistance only after you build up to 30 min on bike    08/09/2019  f/u ov/Andelyn Spade re:  No chief complaint on file.    Dyspnea:  *** Cough: *** Sleeping: *** SABA use: *** 02: ***   No obvious day to day or daytime variability or assoc excess/ purulent sputum or mucus plugs or hemoptysis or cp or chest tightness, subjective wheeze or overt sinus or hb symptoms.   *** without nocturnal  or early am exacerbation  of respiratory  c/o's or need for noct saba. Also denies any obvious fluctuation of symptoms with weather or environmental changes or other aggravating or alleviating factors except as outlined above    No unusual exposure hx or h/o childhood pna/ asthma or knowledge of premature birth.  Current Allergies, Complete Past Medical History, Past Surgical History, Family History, and Social History were reviewed in [employer] record.  ROS  The following are not active complaints unless bolded Hoarseness, sore throat, dysphagia, dental problems, itching, sneezing,  nasal congestion or discharge of excess mucus or purulent secretions, ear ache,   fever, chills, sweats, unintended wt loss or wt gain, classically pleuritic or exertional cp,  orthopnea pnd or arm/hand swelling  or leg swelling, presyncope, palpitations, abdominal pain, anorexia, nausea, vomiting, diarrhea  or change in bowel habits or change in bladder habits, change in stools or change in urine, dysuria, hematuria,  rash, arthralgias, visual complaints, headache, numbness, weakness or ataxia or problems with walking or coordination,  change in mood or  memory.        No outpatient medications have been marked as taking for the 08/09/19 encounter (Appointment) with Tanda Rockers, MD.                                Objective:   Physical Exam      08/09/2019         *** 02/09/2019         176  08/05/2018       172 05/20/2018       176 11/04/2017     180  04/04/2017       179  03/11/2017         180   02/13/2017        175   01/28/17 175 lb 12.8 oz (79.7 kg)  01/16/17 175 lb (79.4 kg)  11/01/16 170 lb (77.1 kg)    Vital signs reviewed  08/09/2019  - Note at rest 02 sats  ***% on ***          Mod barrel  contour             Assessment:

## 2019-08-12 ENCOUNTER — Ambulatory Visit (INDEPENDENT_AMBULATORY_CARE_PROVIDER_SITE_OTHER): Payer: Medicare Other | Admitting: Physician Assistant

## 2019-08-12 ENCOUNTER — Other Ambulatory Visit: Payer: Self-pay

## 2019-08-12 VITALS — BP 133/86 | HR 106 | Temp 97.9°F | Resp 20 | Ht 70.0 in | Wt 172.6 lb

## 2019-08-12 DIAGNOSIS — I779 Disorder of arteries and arterioles, unspecified: Secondary | ICD-10-CM

## 2019-08-12 DIAGNOSIS — M5412 Radiculopathy, cervical region: Secondary | ICD-10-CM | POA: Diagnosis not present

## 2019-08-12 DIAGNOSIS — M961 Postlaminectomy syndrome, not elsewhere classified: Secondary | ICD-10-CM | POA: Diagnosis not present

## 2019-08-12 DIAGNOSIS — M7541 Impingement syndrome of right shoulder: Secondary | ICD-10-CM | POA: Diagnosis not present

## 2019-08-12 DIAGNOSIS — M47812 Spondylosis without myelopathy or radiculopathy, cervical region: Secondary | ICD-10-CM | POA: Diagnosis not present

## 2019-08-12 NOTE — Progress Notes (Signed)
HISTORY AND PHYSICAL     CC:  follow up. Requesting Provider:  [name], MD  HPI: This is a 69 y.o. male who is here today for follow up for PAD.  He underwent arteriogram by Dr. Carlis Abbott on 08/04/2019 for right leg claudication.  He does not have CLI and he did not think intervention on the right tibial disease would be durable and wanted to reserve intervention for when he developed CLI.  He started him on Pletal with asa/statin and follow up in one month.  The pt walked in today with concerns of new area on his right calf.  He states that he noticed it a couple of days ago, but his wife had said it was present for about a week.  He does not remember bumping his leg on anything.  It is not an open wound and has not been draining.    He states that he feels the Pletal is helping with his walking.    He is going for xrays today for his left shoulder.   The pt is on a statin for cholesterol management.    The pt is on an aspirin.    Other AC:  none The pt is not on meds for hypertension.  The pt does have diabetes. Tobacco hx:  former   Past Medical History:  Diagnosis Date  . Arthritis    RA  . Bronchitis   . Carotid artery occlusion   . Cervical spine disease    Neck surgery in 2007 with Dr. Joya Salm  . COPD (chronic obstructive pulmonary disease) ([place])   . Coronary atherosclerosis of native coronary artery    Nonobstrtuctive by catheterization 2007, 2013, 2021  . DVT (deep venous thrombosis) ([place])   . Essential hypertension   . GERD (gastroesophageal reflux disease)   . Hyperlipidemia   . PAD (peripheral artery disease) ([place])    s/p left fem-pop bypass 2007; PTA and stenting of right iliac artery 09/2011  . Rheumatoid arthritis(714.0)   . Type 2 diabetes mellitus ([place])     Past Surgical History:  Procedure Laterality Date  . ABDOMINAL AORTAGRAM N/A 10/03/2011   Procedure: ABDOMINAL Maxcine Ham;  Surgeon: Conrad [place], MD;  [facility] CATH LAB;  Service: Cardiovascular;   Laterality: N/A;  . ABDOMINAL AORTOGRAM W/LOWER EXTREMITY N/A 01/16/2017   Procedure: ABDOMINAL AORTOGRAM W/LOWER EXTREMITY;  Surgeon: Conrad Cuba, MD;  Location: [place] CV LAB;  Service: Cardiovascular;  Laterality: N/A;  . ABDOMINAL AORTOGRAM W/LOWER EXTREMITY Bilateral 08/04/2019   Procedure: ABDOMINAL AORTOGRAM W/LOWER EXTREMITY;  Surgeon: Marty Heck, MD;  Location: [place] CV LAB;  Service: Cardiovascular;  Laterality: Bilateral;  . CERVICAL DISC SURGERY    . COLONOSCOPY WITH PROPOFOL N/A 02/17/2017   Procedure: COLONOSCOPY WITH PROPOFOL;  Surgeon: Daneil Dolin, MD;  [facility];  Service: Endoscopy;  Laterality: N/A;  2:45pm  . FEMORAL-POPLITEAL BYPASS GRAFT  11/06/2005  . ILIAC ARTERY STENT  10-03-11   Right CIA stenting  . LEFT HEART CATH AND CORONARY ANGIOGRAPHY N/A 04/14/2019   Procedure: LEFT HEART CATH AND CORONARY ANGIOGRAPHY;  Surgeon: Troy Sine, MD;  Location: [place] CV LAB;  Service: Cardiovascular;  Laterality: N/A;  . PERIPHERAL VASCULAR BALLOON ANGIOPLASTY Right 01/16/2017   Procedure: PERIPHERAL VASCULAR BALLOON ANGIOPLASTY;  Surgeon: Conrad Media, MD;  Location: [place] CV LAB;  Service: Cardiovascular;  Laterality: Right;  common iliac   . PR VEIN BYPASS GRAFT,AORTO-FEM-POP  06-22-2010   Redo  Left Fem-Pop  . Repair of left arm fracture    . 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 SURGERY SCROTAL / TESTICULAR      No Known Allergies  Current Outpatient Medications  Medication Sig Dispense Refill  . aspirin EC 81 MG tablet Take 1 tablet (81 mg total) by mouth daily.    Marland Kitchen atorvastatin (LIPITOR) 20 MG tablet Take 20 mg by mouth daily.    . budesonide-formoterol (SYMBICORT) 160-4.5 MCG/ACT inhaler INHALE 2 PUFFS BY MOUTH TWICE A DAY RINSE MOUTH AFTER USE. (Patient taking differently: Inhale 2 puffs into the lungs in the morning and at bedtime. ) 10.2 g 5  . celecoxib (CELEBREX) 200 MG capsule Take 1 capsule (200 mg total) by mouth  daily. (HOLD X 5 DAYS WHILE TAKING THE IBUPROFEN)    . cilostazol (PLETAL) 100 MG tablet Take 1 tablet (100 mg total) by mouth 2 (two) times daily. 60 tablet 5  . clopidogrel (PLAVIX) 75 MG tablet Take 1 tablet (75 mg total) by mouth daily. 30 tablet 11  . Dexlansoprazole (DEXILANT) 30 MG capsule Take 30- 60 min before your first and last meals of the day (Patient taking differently: Take 30 mg by mouth daily. ) 60 capsule 11  . diclofenac Sodium (VOLTAREN) 1 % GEL Apply 2 g topically at bedtime as needed (Pain).     Marland Kitchen docusate sodium (COLACE) 100 MG capsule Take 100 mg by mouth daily as needed for mild constipation or moderate constipation.     Marland Kitchen glipiZIDE (GLUCOTROL) 5 MG tablet Take 5 mg by mouth daily.     Marland Kitchen HYDROcodone-acetaminophen (NORCO) 7.5-325 MG per tablet Take 1 tablet by mouth 2 (two) times daily as needed for moderate pain.     Marland Kitchen ibuprofen (ADVIL) 200 MG tablet Take 400 mg by mouth in the morning and at bedtime.    Marland Kitchen JANUVIA 100 MG tablet Take 100 mg by mouth daily.     Marland Kitchen ketoconazole (NIZORAL) 2 % cream Apply to both feet once daily for 4 weeks (Patient taking differently: Apply 1 application topically daily as needed for irritation. ) 30 g 0  . losartan (COZAAR) 100 MG tablet Take 100 mg by mouth daily.    . metFORMIN (GLUCOPHAGE) 1000 MG tablet Take 500 mg by mouth 2 (two) times daily with a meal.     . nitroGLYCERIN (NITROSTAT) 0.4 MG SL tablet Place 1 tablet (0.4 mg total) under the tongue every 5 (five) minutes as needed for chest pain. 25 tablet 3  . ONE TOUCH ULTRA TEST test strip     . OneTouch Delica Lancets 02D MISC Apply topically.    Marland Kitchen PROAIR HFA 108 (90 BASE) MCG/ACT inhaler Inhale 2 puffs into the lungs every 6 (six) hours as needed for wheezing or shortness of breath.     . Tiotropium Bromide Monohydrate (SPIRIVA RESPIMAT) 2.5 MCG/ACT AERS Inhale 2 puffs into the lungs daily. 4 g 11  . TURMERIC CURCUMIN PO Take 1,500 mg by mouth daily.    . vitamin C (ASCORBIC ACID)  500 MG tablet Take 500 mg by mouth daily.    [name] 5 MG TABS Take 1 tablet by mouth daily. (Patient taking differently: Take 5 mg by mouth at bedtime. ) 90 tablet 0  . isosorbide mononitrate (IMDUR) 30 MG 24 hr tablet Take 1 tablet (30 mg total) by mouth daily. 30 tablet 6   No current facility-administered medications for this visit.    Family History  Problem  Relation Age of Onset  . Heart attack Mother 71  . Deep vein thrombosis Mother   . Diabetes Mother   . Hyperlipidemia Mother   . Hypertension Mother   . Peripheral vascular disease Mother   . Stroke Father   . Diabetes Father   . Hyperlipidemia Father   . Hypertension Father   . Peripheral vascular disease Father   . Diabetes Sister   . Hyperlipidemia Sister   . Hypertension Sister   . Diabetes Brother   . Hyperlipidemia Brother   . Cancer Brother   . Deep vein thrombosis Brother   . Hypertension Brother   . Peripheral vascular disease Brother   . Hyperlipidemia Son   . Cancer Son   . Hypertension Son   . Colon cancer Neg Hx     Social History   Socioeconomic History  . Marital status: Single    Spouse name: Not on file  . Number of children: Not on file  . Years of education: Not on file  . Highest education level: Not on file  Occupational History  . Not on file  Tobacco Use  . Smoking status: Former Smoker    Packs/day: 1.00    Years: 45.00    Pack years: 45.00    Types: Cigarettes, Cigars    Quit date: 09/01/2011    Years since quitting: 7.9  . Smokeless tobacco: Never Used  Vaping Use  . Vaping Use: Never used  Substance and Sexual Activity  . Alcohol use: No    Alcohol/week: 0.0 standard drinks  . Drug use: No  . Sexual activity: Not Currently    Birth control/protection: None  Other Topics Concern  . Not on file  Social History Narrative   The patient lives in [place].    Social Determinants of Health   Financial Resource Strain:   . Difficulty of Paying Living Expenses:   Food  Insecurity:   . Worried About Charity fundraiser in the Last Year:   . Arboriculturist in the Last Year:   Transportation Needs:   . Film/video editor (Medical):   Marland Kitchen Lack of Transportation (Non-Medical):   Physical Activity:   . Days of Exercise per Week:   . Minutes of Exercise per Session:   Stress:   . Feeling of Stress :   Social Connections:   . Frequency of Communication with Friends and Family:   . Frequency of Social Gatherings with Friends and Family:   . Attends Religious Services:   . Active Member of Clubs or Organizations:   . Attends Archivist Meetings:   Marland Kitchen Marital Status:   Intimate Partner Violence:   . Fear of Current or Ex-Partner:   . Emotionally Abused:   Marland Kitchen Physically Abused:   . Sexually Abused:      REVIEW OF SYSTEMS:   [X]  denotes positive finding, [ ]  denotes negative finding Cardiac  Comments:  Chest pain or chest pressure:    Shortness of breath upon exertion:    Short of breath when lying flat:    Irregular heart rhythm:        Vascular    Pain in calf, thigh, or hip brought on by ambulation: x   Pain in feet at night that wakes you up from your sleep:     Blood clot in your veins:    Leg swelling:         Pulmonary    Oxygen at home:  Productive cough:     Wheezing:         Neurologic    Sudden weakness in arms or legs:     Sudden numbness in arms or legs:     Sudden onset of difficulty speaking or slurred speech:    Temporary loss of vision in one eye:     Problems with dizziness:         Gastrointestinal    Blood in stool:     Vomited blood:         Genitourinary    Burning when urinating:     Blood in urine:        Psychiatric    Major depression:         Hematologic    Bleeding problems:    Problems with blood clotting too easily:        Skin    Rashes or ulcers: x See HPI      Constitutional    Fever or chills:      PHYSICAL EXAMINATION:  Today's Vitals   08/12/19 1012  BP: 133/86    Pulse: (!) 106  Resp: 20  Temp: 97.9 F (36.6 C)  TempSrc: Temporal  SpO2: 96%  Weight: 172 lb 9.6 oz (78.3 kg)  Height: 5\' 10"  (1.778 m)  PainSc: 9   PainLoc: Leg   Body mass index is 24.77 kg/m.   General:  WDWN in NAD; vital signs documented above Gait: Not observed HENT: WNL, normocephalic Pulmonary: normal non-labored breathing , without wheezing Cardiac: regular HR, without  Murmur; without carotid bruits Skin: without rashes Vascular Exam/Pulses:  Right Left  DP Palpable also brisk doppler signal Brisk doppler signal  PT Brisk doppler signal Brisk doppler signal but less than the right   Extremities: without ischemic changes, without Gangrene , without cellulitis; without open wounds    Musculoskeletal: no muscle wasting or atrophy  Neurologic: A&O X 3;  No focal weakness or paresthesias are detected Psychiatric:  The pt has Normal affect.   Non-Invasive Vascular Imaging:   None today   ASSESSMENT/PLAN:: 69 y.o. male  With recent arteriogram for claudication here for concerns for new spot on his right calf  -area of concern pictured above.  Pt has brisk doppler flow right foot.  The area of concern on the right calf appears to be a bruise.  Instructed him that as of right now, this is not concerning.  Continue to keep an eye on this and if it worsens, opens, or starts draining we will see him back.  Otherwise, keep appt on 09/07/2019 for studies and follow up visit.  -of note, he is having some improvement with his claudication with the Pletal.  Continue walking program.  .   Leontine Locket, [facility] [phone]  Clinic MD:   Oneida Alar

## 2019-08-24 ENCOUNTER — Other Ambulatory Visit: Payer: Self-pay

## 2019-08-24 DIAGNOSIS — I739 Peripheral vascular disease, unspecified: Secondary | ICD-10-CM

## 2019-08-27 DIAGNOSIS — M75112 Incomplete rotator cuff tear or rupture of left shoulder, not specified as traumatic: Secondary | ICD-10-CM | POA: Diagnosis not present

## 2019-08-27 DIAGNOSIS — S43432A Superior glenoid labrum lesion of left shoulder, initial encounter: Secondary | ICD-10-CM | POA: Diagnosis not present

## 2019-08-27 DIAGNOSIS — M7592 Shoulder lesion, unspecified, left shoulder: Secondary | ICD-10-CM | POA: Diagnosis not present

## 2019-08-29 ENCOUNTER — Encounter: Payer: Self-pay | Admitting: Cardiology

## 2019-08-29 NOTE — Progress Notes (Signed)
Cardiology Office Note  Date: 08/30/2019   ID: [name], DOB May 09, 1950, MRN [id number]  PCP:  [name], MD  Cardiologist:  Rozann Lesches, MD Electrophysiologist:  None   Chief Complaint  Patient presents with   Cardiac follow-up    History of Present Illness: [name] is a 69 y.o. male last seen by Mr. Leonides Sake NP in May.  He presents for routine visit.  He does not report any accelerating angina symptoms with typical ADLs, reports walking regularly for exercise.  He walked 2 miles this morning by report, no progressive claudication either.  Cardiac catheterization from April of this year is outlined below, nonobstructive CAD documented at that time with plan for medical therapy.  Recent follow-up visit noted with [facility] for review of PAD.  I went over the note.  He underwent recent angiography with Dr. Carlis Abbott without findings of clearly obstructive disease, currently on Pletal.  Lipid panel from January is outlined below.  LDL was in the 80s at that time.  We went over his medications today.  He tells me that sometimes his systolic blood pressure is under 100 when he checks it, he does not take his medications on those days.  He has actually been taking losartan at 50 mg daily instead of 100 mg daily.   Past Medical History:  Diagnosis Date   Arthritis    RA   Bronchitis    Carotid artery disease ([place])    Cervical spine disease    Neck surgery in 2007 with Dr. Joya Salm   COPD (chronic obstructive pulmonary disease) ([place])    Coronary atherosclerosis of native coronary artery    Nonobstrtuctive by catheterization 2007, 2013, 2021   DVT (deep venous thrombosis) ([place])    Essential hypertension    GERD (gastroesophageal reflux disease)    Hyperlipidemia    PAD (peripheral artery disease) ([place])    s/p left fem-pop bypass 2007; PTA and stenting of right iliac artery 09/2011   Rheumatoid arthritis(714.0)    Type 2 diabetes mellitus ([place])     Past  Surgical History:  Procedure Laterality Date   ABDOMINAL AORTAGRAM N/A 10/03/2011   Procedure: ABDOMINAL Maxcine Ham;  Surgeon: Conrad Palermo, MD;  Location: [facility] CATH LAB;  Service: Cardiovascular;  Laterality: N/A;   ABDOMINAL AORTOGRAM W/LOWER EXTREMITY N/A 01/16/2017   Procedure: ABDOMINAL AORTOGRAM W/LOWER EXTREMITY;  Surgeon: Conrad [place], MD;  Location: [place] CV LAB;  Service: Cardiovascular;  Laterality: N/A;   ABDOMINAL AORTOGRAM W/LOWER EXTREMITY Bilateral 08/04/2019   Procedure: ABDOMINAL AORTOGRAM W/LOWER EXTREMITY;  Surgeon: Marty Heck, MD;  Location: [place] CV LAB;  Service: Cardiovascular;  Laterality: Bilateral;   CERVICAL DISC SURGERY     COLONOSCOPY WITH PROPOFOL N/A 02/17/2017   Procedure: COLONOSCOPY WITH PROPOFOL;  Surgeon: Daneil Dolin, MD;  [facility];  Service: Endoscopy;  Laterality: N/A;  2:45pm   FEMORAL-POPLITEAL BYPASS GRAFT  11/06/2005   ILIAC ARTERY STENT  10-03-11   Right CIA stenting   LEFT HEART CATH AND CORONARY ANGIOGRAPHY N/A 04/14/2019   Procedure: LEFT HEART CATH AND CORONARY ANGIOGRAPHY;  Surgeon: Troy Sine, MD;  Location: [place] CV LAB;  Service: Cardiovascular;  Laterality: N/A;   PERIPHERAL VASCULAR BALLOON ANGIOPLASTY Right 01/16/2017   Procedure: PERIPHERAL VASCULAR BALLOON ANGIOPLASTY;  Surgeon: Conrad [place], MD;  Location: [place] CV LAB;  Service: Cardiovascular;  Laterality: Right;  common iliac    PR VEIN BYPASS GRAFT,AORTO-FEM-POP  06-22-2010   Redo  Left Fem-Pop   Repair of left arm fracture     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     budesonide-formoterol (SYMBICORT) 160-4.5 MCG/ACT inhaler INHALE 2 PUFFS BY MOUTH TWICE A DAY RINSE MOUTH AFTER USE. (Patient taking differently: Inhale 2 puffs into the lungs in the morning  and at bedtime. ) 10.2 g 5   cilostazol (PLETAL) 100 MG tablet Take 1 tablet (100 mg total) by mouth 2 (two) times daily. 60 tablet 5   clopidogrel (PLAVIX) 75 MG tablet Take 1 tablet (75 mg total) by mouth daily. 30 tablet 11   Dexlansoprazole (DEXILANT) 30 MG capsule Take 30- 60 min before your first and last meals of the day (Patient taking differently: Take 30 mg by mouth daily. ) 60 capsule 11   diclofenac Sodium (VOLTAREN) 1 % GEL Apply 2 g topically at bedtime as needed (Pain).      docusate sodium (COLACE) 100 MG capsule Take 100 mg by mouth daily as needed for mild constipation or moderate constipation.      glipiZIDE (GLUCOTROL) 5 MG tablet Take 5 mg by mouth daily.      HYDROcodone-acetaminophen (NORCO) 7.5-325 MG per tablet Take 1 tablet by mouth 2 (two) times daily as needed for moderate pain.      ibuprofen (ADVIL) 200 MG tablet Take 400 mg by mouth in the morning and at bedtime.     JANUVIA 100 MG tablet Take 100 mg by mouth daily.      ketoconazole (NIZORAL) 2 % cream Apply to both feet once daily for 4 weeks (Patient taking differently: Apply 1 application topically daily as needed for irritation. ) 30 g 0   metFORMIN (GLUCOPHAGE) 1000 MG tablet Take 500 mg by mouth 2 (two) times daily with a meal.      nitroGLYCERIN (NITROSTAT) 0.4 MG SL tablet Place 1 tablet (0.4 mg total) under the tongue every 5 (five) minutes as needed for chest pain. 25 tablet 3   ONE TOUCH ULTRA TEST test strip      OneTouch Delica Lancets 02D MISC Apply topically.     PROAIR HFA 108 (90 BASE) MCG/ACT inhaler Inhale 2 puffs into the lungs every 6 (six) hours as needed for wheezing or shortness of breath.      Tiotropium Bromide Monohydrate (SPIRIVA RESPIMAT) 2.5 MCG/ACT AERS Inhale 2 puffs into the lungs daily. 4 g 11   TURMERIC CURCUMIN PO Take 1,500 mg by mouth daily.     vitamin C (ASCORBIC ACID) 500 MG tablet Take 500 mg by mouth daily.     XELJANZ 5 MG TABS Take 1 tablet by mouth  daily. (Patient taking differently: Take 5 mg by mouth at bedtime. ) 90 tablet 0   atorvastatin (LIPITOR) 40 MG tablet Take 1 tablet (40 mg total) by mouth daily. 90 tablet 2   isosorbide mononitrate (IMDUR) 30 MG 24 hr tablet Take 1 tablet (30 mg total) by mouth daily. 30 tablet 6   losartan (COZAAR) 50 MG tablet Take 1 tablet (50 mg total) by mouth daily. 90 tablet 2   No current facility-administered medications for this visit.   Allergies:  Patient has no known allergies.   ROS:   Stable claudication.  No palpitations or syncope.  Physical Exam: VS:  BP 114/70  Pulse 100    Ht 5\' 10"  (1.778 m)    Wt 170 lb (77.1 kg)    SpO2 94%    BMI 24.39 kg/m , BMI Body mass index is 24.39 kg/m.  Wt Readings from Last 3 Encounters:  08/30/19 170 lb (77.1 kg)  08/12/19 172 lb 9.6 oz (78.3 kg)  08/04/19 175 lb (79.4 kg)    General: Patient appears comfortable at rest. HEENT: Conjunctiva and lids normal, wearing a mask. Neck: Supple, no elevated JVP or carotid bruits, no thyromegaly. Lungs: Clear to auscultation, nonlabored breathing at rest. Cardiac: Regular rate and rhythm, no S3 or significant systolic murmur. Extremities: No pitting edema. Skin: Warm and dry.  ECG:  An ECG dated 04/13/2019 was personally reviewed today and demonstrated:  Sinus rhythm with nonspecific T wave changes, low voltage in the limb leads.  Recent Labwork: 01/15/2019: ALT 23; AST 25 04/13/2019: Platelets 283 08/04/2019: BUN 15; Creatinine, Ser 0.80; Hemoglobin 14.3; Potassium 4.1; Sodium 143     Component Value Date/Time   CHOL 163 01/15/2019 1620   TRIG 134 01/15/2019 1620   HDL 56 01/15/2019 1620   CHOLHDL 2.9 01/15/2019 1620   CHOLHDL 2.7 08/06/2018 1035   LDLCALC 84 01/15/2019 1620   LDLCALC 88 08/06/2018 1035    Other Studies Reviewed Today:  Cardiac catheterization 04/14/2019:  Prox LAD to Mid LAD lesion is 20% stenosed.  Mid LAD lesion is 30% stenosed.  Prox RCA lesion is 30% stenosed.   Mild  non-obstructive CAD with 20 and 30% irregularities in the proximal to mid LAD; normal ramus intermediate and left circumflex coronary arteries;  and RCA with smooth 30% proximal narrowing with distal vessel tortuosity.  Low normal global LV function with EF estimated at 50%; LVEDP 14 mmHg.  Assessment and Plan:  1.  Nonobstructive CAD by cardiac catheterization in April of this year.  Plan is to continue medical therapy and observation.  Currently on aspirin, Plavix, Lipitor, losartan, and Imdur.  2.  Mixed hyperlipidemia, last LDL 84 on Lipitor 20 mg daily which will be increased to 40 mg daily.  Follow-up FLP in 12 weeks.  3.  Essential hypertension, blood pressure well controlled today.  Patient does report intermittent low blood pressures with systolics under 983.  Continue current regimen including losartan at 50 mg daily instead of 100 mg daily.  He will check blood pressure once daily for 2 weeks and report back.  4.  PAD, followed by [facility].  I reviewed the most recent note.  He reports stable claudication on Pletal.  Continue antiplatelet regimen and statin.  He is walking for exercise.  Medication Adjustments/Labs and Tests Ordered: Current medicines are reviewed at length with the patient today.  Concerns regarding medicines are outlined above.   Tests Ordered: Orders Placed This Encounter  Procedures   Lipid panel    Medication Changes: Meds ordered this encounter  Medications   losartan (COZAAR) 50 MG tablet    Sig: Take 1 tablet (50 mg total) by mouth daily.    Dispense:  90 tablet    Refill:  2    08/30/2019 dose decrease   atorvastatin (LIPITOR) 40 MG tablet    Sig: Take 1 tablet (40 mg total) by mouth daily.    Dispense:  90 tablet    Refill:  2    08/30/2019 dose increase    Disposition:  Follow up 6 months in the [place] office.  Signed, Satira Sark, MD, [facility] 08/30/2019 8:47 AM  [facility] [place],  [place], [place] [address] Phone: [phone]; Fax: [phone]

## 2019-08-30 ENCOUNTER — Ambulatory Visit (INDEPENDENT_AMBULATORY_CARE_PROVIDER_SITE_OTHER): Payer: Medicare Other | Admitting: Cardiology

## 2019-08-30 ENCOUNTER — Other Ambulatory Visit: Payer: Self-pay

## 2019-08-30 ENCOUNTER — Encounter: Payer: Self-pay | Admitting: Cardiology

## 2019-08-30 VITALS — BP 114/70 | HR 100 | Ht 70.0 in | Wt 170.0 lb

## 2019-08-30 DIAGNOSIS — I10 Essential (primary) hypertension: Secondary | ICD-10-CM | POA: Diagnosis not present

## 2019-08-30 DIAGNOSIS — E782 Mixed hyperlipidemia: Secondary | ICD-10-CM | POA: Diagnosis not present

## 2019-08-30 DIAGNOSIS — I25119 Atherosclerotic heart disease of native coronary artery with unspecified angina pectoris: Secondary | ICD-10-CM | POA: Diagnosis not present

## 2019-08-30 DIAGNOSIS — I739 Peripheral vascular disease, unspecified: Secondary | ICD-10-CM | POA: Diagnosis not present

## 2019-08-30 DIAGNOSIS — E1165 Type 2 diabetes mellitus with hyperglycemia: Secondary | ICD-10-CM | POA: Diagnosis not present

## 2019-08-30 MED ORDER — LOSARTAN POTASSIUM 50 MG PO TABS
50.0000 mg | ORAL_TABLET | Freq: Every day | ORAL | 2 refills | Status: DC
Start: 2019-08-30 — End: 2019-09-22

## 2019-08-30 MED ORDER — ATORVASTATIN CALCIUM 40 MG PO TABS
40.0000 mg | ORAL_TABLET | Freq: Every day | ORAL | 2 refills | Status: DC
Start: 2019-08-30 — End: 2020-01-27

## 2019-08-30 NOTE — Patient Instructions (Addendum)
Medication Instructions:   Your physician has recommended you make the following change in your medication:   Continue losartan 50 mg by mouth daily  Increase your atorvastatin to 40 mg by mouth daily. You may take (2) of your 20 mg tablets daily until finished  Continue other medications the same  Labwork: Your physician recommends that you return for a FASTING lipid profile: in 3 months. Please do not eat or drink for at least 8 hours when you have this done. You may take your medications that morning with a sip of water.  This may be done at [facility] or [facility] Monday-Friday from 8:00 am - 4:00 pm. No appointment is needed.  Testing/Procedures:  NONE  Follow-Up:  Your physician recommends that you schedule a follow-up appointment in: 6 months.  Any Other Special Instructions Will Be Listed Below (If Applicable). Your physician has requested that you regularly monitor and record your blood pressure readings at home daily for 2 weeks without missing any of your blood pressure medication. Please use the same machine at the same time of day to check your readings and record them. Please bring the readings to our office for review.  If you need a refill on your cardiac medications before your next appointment, please call your pharmacy.

## 2019-09-07 ENCOUNTER — Ambulatory Visit (HOSPITAL_COMMUNITY)
Admission: RE | Admit: 2019-09-07 | Discharge: 2019-09-07 | Disposition: A | Discharge status: Home / Self Care | Payer: Medicare Other | Source: Ambulatory Visit | Attending: Vascular Surgery | Admitting: Vascular Surgery

## 2019-09-07 ENCOUNTER — Other Ambulatory Visit: Payer: Self-pay

## 2019-09-07 ENCOUNTER — Ambulatory Visit (INDEPENDENT_AMBULATORY_CARE_PROVIDER_SITE_OTHER): Payer: Medicare Other | Admitting: Physician Assistant

## 2019-09-07 VITALS — BP 106/75 | HR 93 | Temp 98.3°F | Resp 20 | Ht 70.0 in | Wt 174.1 lb

## 2019-09-07 DIAGNOSIS — I739 Peripheral vascular disease, unspecified: Secondary | ICD-10-CM

## 2019-09-07 DIAGNOSIS — I779 Disorder of arteries and arterioles, unspecified: Secondary | ICD-10-CM | POA: Diagnosis not present

## 2019-09-07 DIAGNOSIS — Z95828 Presence of other vascular implants and grafts: Secondary | ICD-10-CM

## 2019-09-07 NOTE — Progress Notes (Signed)
Office Note     CC:  follow up Requesting Provider:  [name], MD  HPI: [name] is a 69 y.o. (09/23/1950) male who presents for follow-up s/p aortogram on 08/04/2019 for right leg claudication.  The study was significant for tibial disease on the right and since he did not have critical limb ischemia, no intervention was performed.  He was started on Pletal with improvement in symptoms.  Today his primary complaint is of right second toe and plantar foot numbness.  He denies rest pain.   He iss/p left common femoral toabove kneepoplitealbypass with Propaten in 2007 by Dr. Kellie Simmering and then a redo left common femoral to above kneepoplitealbypass with Propaten in 2012. In addition he has undergone a subintimal angioplasty of his right common iliac artery in 2013 with a drug-coated balloon angioplasty in 2019 by Dr. Bridgett Larsson.    The pt is on a statin for cholesterol management.    The pt is on an aspirin.    Other AC:  Plavix The pt is on ARB for hypertension.  The pt does have diabetes. glipizide, metfromin Tobacco hx:  former    Past Medical History:  Diagnosis Date  . Arthritis    RA  . Bronchitis   . Carotid artery disease ([place])   . Cervical spine disease    Neck surgery in 2007 with Dr. Joya Salm  . COPD (chronic obstructive pulmonary disease) ([place])   . Coronary atherosclerosis of native coronary artery    Nonobstrtuctive by catheterization 2007, 2013, 2021  . DVT (deep venous thrombosis) ([place])   . Essential hypertension   . GERD (gastroesophageal reflux disease)   . Hyperlipidemia   . PAD (peripheral artery disease) ([place])    s/p left fem-pop bypass 2007; PTA and stenting of right iliac artery 09/2011  . Rheumatoid arthritis(714.0)   . Type 2 diabetes mellitus ([place])     Past Surgical History:  Procedure Laterality Date  . ABDOMINAL AORTAGRAM N/A 10/03/2011   Procedure: ABDOMINAL Maxcine Ham;  Surgeon: Conrad [place], MD;  Location: [facility] CATH LAB;  Service:  Cardiovascular;  Laterality: N/A;  . ABDOMINAL AORTOGRAM W/LOWER EXTREMITY N/A 01/16/2017   Procedure: ABDOMINAL AORTOGRAM W/LOWER EXTREMITY;  Surgeon: Conrad [place], MD;  Location: [place] CV LAB;  Service: Cardiovascular;  Laterality: N/A;  . ABDOMINAL AORTOGRAM W/LOWER EXTREMITY Bilateral 08/04/2019   Procedure: ABDOMINAL AORTOGRAM W/LOWER EXTREMITY;  Surgeon: Marty Heck, MD;  Location: [place] CV LAB;  Service: Cardiovascular;  Laterality: Bilateral;  . CERVICAL DISC SURGERY    . COLONOSCOPY WITH PROPOFOL N/A 02/17/2017   Procedure: COLONOSCOPY WITH PROPOFOL;  Surgeon: Daneil Dolin, MD;  Location: [facility];  Service: Endoscopy;  Laterality: N/A;  2:45pm  . FEMORAL-POPLITEAL BYPASS GRAFT  11/06/2005  . ILIAC ARTERY STENT  10-03-11   Right CIA stenting  . LEFT HEART CATH AND CORONARY ANGIOGRAPHY N/A 04/14/2019   Procedure: LEFT HEART CATH AND CORONARY ANGIOGRAPHY;  Surgeon: Troy Sine, MD;  Location: [place] CV LAB;  Service: Cardiovascular;  Laterality: N/A;  . PERIPHERAL VASCULAR BALLOON ANGIOPLASTY Right 01/16/2017   Procedure: PERIPHERAL VASCULAR BALLOON ANGIOPLASTY;  Surgeon: Conrad Leland, MD;  Location: [place] CV LAB;  Service: Cardiovascular;  Laterality: Right;  common iliac   . PR VEIN BYPASS GRAFT,AORTO-FEM-POP  06-22-2010   Redo Left Fem-Pop  . Repair of left arm fracture    . 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 SURGERY SCROTAL / TESTICULAR  Social History   Socioeconomic History  . Marital status: Single    Spouse name: Not on file  . Number of children: Not on file  . Years of education: Not on file  . Highest education level: Not on file  Occupational History  . Not on file  Tobacco Use  . Smoking status: Former Smoker    Packs/day: 1.00    Years: 45.00    Pack years: 45.00    Types: Cigarettes, Cigars    Quit date: 09/01/2011    Years since quitting: 8.0  . Smokeless tobacco: Never Used  Vaping Use  . Vaping Use: Never used   Substance and Sexual Activity  . Alcohol use: No    Alcohol/week: 0.0 standard drinks  . Drug use: No  . Sexual activity: Not Currently    Birth control/protection: None  Other Topics Concern  . Not on file  Social History Narrative   The patient lives in [place].    Social Determinants of Health   Financial Resource Strain:   . Difficulty of Paying Living Expenses: Not on file  Food Insecurity:   . Worried About Charity fundraiser in the Last Year: Not on file  . Ran Out of Food in the Last Year: Not on file  Transportation Needs:   . Lack of Transportation (Medical): Not on file  . Lack of Transportation (Non-Medical): Not on file  Physical Activity:   . Days of Exercise per Week: Not on file  . Minutes of Exercise per Session: Not on file  Stress:   . Feeling of Stress : Not on file  Social Connections:   . Frequency of Communication with Friends and Family: Not on file  . Frequency of Social Gatherings with Friends and Family: Not on file  . Attends Religious Services: Not on file  . Active Member of Clubs or Organizations: Not on file  . Attends Archivist Meetings: Not on file  . Marital Status: Not on file  Intimate Partner Violence:   . Fear of Current or Ex-Partner: Not on file  . Emotionally Abused: Not on file  . Physically Abused: Not on file  . Sexually Abused: Not on file   Family History  Problem Relation Age of Onset  . Heart attack Mother 30  . Deep vein thrombosis Mother   . Diabetes Mother   . Hyperlipidemia Mother   . Hypertension Mother   . Peripheral vascular disease Mother   . Stroke Father   . Diabetes Father   . Hyperlipidemia Father   . Hypertension Father   . Peripheral vascular disease Father   . Diabetes Sister   . Hyperlipidemia Sister   . Hypertension Sister   . Diabetes Brother   . Hyperlipidemia Brother   . Cancer Brother   . Deep vein thrombosis Brother   . Hypertension Brother   . Peripheral vascular disease  Brother   . Hyperlipidemia Son   . Cancer Son   . Hypertension Son   . Colon cancer Neg Hx     Current Outpatient Medications  Medication Sig Dispense Refill  . aspirin EC 81 MG tablet Take 1 tablet (81 mg total) by mouth daily.    Marland Kitchen atorvastatin (LIPITOR) 40 MG tablet Take 1 tablet (40 mg total) by mouth daily. 90 tablet 2  . budesonide-formoterol (SYMBICORT) 160-4.5 MCG/ACT inhaler INHALE 2 PUFFS BY MOUTH TWICE A DAY RINSE MOUTH AFTER USE. (Patient taking differently: Inhale 2 puffs into the lungs in  the morning and at bedtime. ) 10.2 g 5  . cilostazol (PLETAL) 100 MG tablet Take 1 tablet (100 mg total) by mouth 2 (two) times daily. 60 tablet 5  . clopidogrel (PLAVIX) 75 MG tablet Take 1 tablet (75 mg total) by mouth daily. 30 tablet 11  . Dexlansoprazole (DEXILANT) 30 MG capsule Take 30- 60 min before your first and last meals of the day (Patient taking differently: Take 30 mg by mouth daily. ) 60 capsule 11  . diclofenac Sodium (VOLTAREN) 1 % GEL Apply 2 g topically at bedtime as needed (Pain).     Marland Kitchen docusate sodium (COLACE) 100 MG capsule Take 100 mg by mouth daily as needed for mild constipation or moderate constipation.     Marland Kitchen glipiZIDE (GLUCOTROL) 5 MG tablet Take 5 mg by mouth daily.     Marland Kitchen HYDROcodone-acetaminophen (NORCO) 7.5-325 MG per tablet Take 1 tablet by mouth 2 (two) times daily as needed for moderate pain.     Marland Kitchen ibuprofen (ADVIL) 200 MG tablet Take 400 mg by mouth in the morning and at bedtime.    . isosorbide mononitrate (IMDUR) 30 MG 24 hr tablet Take 1 tablet (30 mg total) by mouth daily. 30 tablet 6  . JANUVIA 100 MG tablet Take 100 mg by mouth daily.     Marland Kitchen ketoconazole (NIZORAL) 2 % cream Apply to both feet once daily for 4 weeks (Patient taking differently: Apply 1 application topically daily as needed for irritation. ) 30 g 0  . losartan (COZAAR) 50 MG tablet Take 1 tablet (50 mg total) by mouth daily. 90 tablet 2  . metFORMIN (GLUCOPHAGE) 1000 MG tablet Take 500 mg by  mouth 2 (two) times daily with a meal.     . nitroGLYCERIN (NITROSTAT) 0.4 MG SL tablet Place 1 tablet (0.4 mg total) under the tongue every 5 (five) minutes as needed for chest pain. 25 tablet 3  . ONE TOUCH ULTRA TEST test strip     . OneTouch Delica Lancets 10F MISC Apply topically.    Marland Kitchen PROAIR HFA 108 (90 BASE) MCG/ACT inhaler Inhale 2 puffs into the lungs every 6 (six) hours as needed for wheezing or shortness of breath.     . Tiotropium Bromide Monohydrate (SPIRIVA RESPIMAT) 2.5 MCG/ACT AERS Inhale 2 puffs into the lungs daily. 4 g 11  . TURMERIC CURCUMIN PO Take 1,500 mg by mouth daily.    . vitamin C (ASCORBIC ACID) 500 MG tablet Take 500 mg by mouth daily.    Morrie Sheldon 5 MG TABS Take 1 tablet by mouth daily. (Patient taking differently: Take 5 mg by mouth at bedtime. ) 90 tablet 0   No current facility-administered medications for this visit.    No Known Allergies   REVIEW OF SYSTEMS:   [X]  denotes positive finding, [ ]  denotes negative finding Cardiac  Comments:  Chest pain or chest pressure:    Shortness of breath upon exertion: x   Short of breath when lying flat:    Irregular heart rhythm:        Vascular    Pain in calf, thigh, or hip brought on by ambulation: x  improved  Pain in feet at night that wakes you up from your sleep:     Blood clot in your veins:    Leg swelling:         Pulmonary    Oxygen at home:    Productive cough:  x   Wheezing:  Neurologic    Sudden weakness in arms or legs:     Sudden numbness in arms or legs:     Sudden onset of difficulty speaking or slurred speech:    Temporary loss of vision in one eye:     Problems with dizziness:  x  occasional      Gastrointestinal    Blood in stool:     Vomited blood:         Genitourinary    Burning when urinating:     Blood in urine:        Psychiatric    Major depression:         Hematologic    Bleeding problems:    Problems with blood clotting too easily:        Skin      Rashes or ulcers:        Constitutional    Fever or chills:      PHYSICAL EXAMINATION:  Today's Vitals   09/07/19 1006  BP: 106/75  Pulse: 93  Resp: 20  Temp: 98.3 F (36.8 C)  TempSrc: Temporal  SpO2: 97%  Weight: 174 lb 1.6 oz (79 kg)  Height: 5\' 10"  (1.778 m)  PainSc: 0-No pain   Body mass index is 24.98 kg/m. General:  WDWN in NAD; vital signs documented above Gait: Unaided, no ataxia HENT: WNL, normocephalic Pulmonary: normal non-labored breathing  Cardiac: regular HR,  Abdomen: soft, NT, no masses Skin: without rashes Vascular Exam/Pulses: 2+ right femoral pulse, 3+ left femoral pulse.  He has brisk bilateral DP, PT and peroneal Doppler signals.  Signals on the left are monophasic. Extremities: without ischemic changes, without Gangrene , without cellulitis; without open wounds;  Musculoskeletal: no muscle wasting or atrophy  Neurologic: A&O X 3;  No focal weakness or paresthesias are detected Psychiatric:  The pt has Normal affect.  Aortogram 08/04/2019: Right lower extremity arteriogram shows a patent common femoral profunda as well as a patent SFA. In the mid segment of SFA is onefocal 50% stenosis that does not appear flow-limiting and the above and below-knee popliteal artery is widely patent. 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 The peroneal appears to have a proximal occlusion that isshort segmentand reconstitures. Dominant runoff isthe anterior tibial that does have about a 50% stenosis proximally and appears to occlude just above the ankle.Small vessel disease in the right foot.  Non-Invasive Vascular Imaging:   09/07/2019 ABI/TBIToday's ABIToday's TBIPrevious ABIPrevious TBI  +-------+-----------+-----------+------------+------------+  Right 0.94    0.34    0.97    0.28      +-------+-----------+-----------+------------+------------+  Left  1.24    0.47    [place]     0.49       +-------+-----------+-----------+------------+------------+  Right toe pressure is 51 Left toe pressure 72  Arterial wall calcification precludes accurate ankle pressures and ABIs.    Summary:  Right: Resting right ankle-brachial index is within normal range. However,  this is falsely elevated. The right toe-brachial index is abnormal.   Left: Resting left ankle-brachial index is within normal range. However,  this is falsely elevated. The left toe-brachial index is abnormal.    ASSESSMENT/PLAN:: 69 y.o. male here for follow up for PAD, claudication.  He is noting some improvement with Pletal>>will continue.  His feet are warm and well perfused with good Doppler signals bilaterally.  ABIs are noncompressible.  No appreciable change in TBI.    We discussed the numbness in his right foot and that this  is most likely a degree of diabetic neuropathy with evidence of small vessel disease on ateriogram.  Advised him to wear his diabetic foot wear and to inspect his toes and feet daily.    Encouraged good glucose control, continue Pletal, Plavix, aspirin and statin.  Continue exercise and follow-up. He wishes to be seen in 3 months and I will order ABIs.  Will need repeat duplex in 6 months time.  I advised him to call sooner for development of rest pain or skin/nail ulceration or infection.   Barbie Banner, PA-C [facility] [phone]  Clinic MD:   Carlis Abbott

## 2019-09-09 ENCOUNTER — Other Ambulatory Visit: Payer: Self-pay | Admitting: *Deleted

## 2019-09-09 DIAGNOSIS — I739 Peripheral vascular disease, unspecified: Secondary | ICD-10-CM

## 2019-09-22 ENCOUNTER — Telehealth: Payer: Self-pay | Admitting: *Deleted

## 2019-09-22 MED ORDER — LOSARTAN POTASSIUM 25 MG PO TABS
25.0000 mg | ORAL_TABLET | Freq: Every day | ORAL | 3 refills | Status: DC
Start: 2019-09-22 — End: 2020-01-27

## 2019-09-22 NOTE — Telephone Encounter (Signed)
See scanned home BP log for details. Patient informed and verbalized understanding of plan.

## 2019-09-24 DIAGNOSIS — M19012 Primary osteoarthritis, left shoulder: Secondary | ICD-10-CM | POA: Diagnosis not present

## 2019-09-30 DIAGNOSIS — E1142 Type 2 diabetes mellitus with diabetic polyneuropathy: Secondary | ICD-10-CM | POA: Diagnosis not present

## 2019-09-30 DIAGNOSIS — I10 Essential (primary) hypertension: Secondary | ICD-10-CM | POA: Diagnosis not present

## 2019-09-30 DIAGNOSIS — Z299 Encounter for prophylactic measures, unspecified: Secondary | ICD-10-CM | POA: Diagnosis not present

## 2019-09-30 DIAGNOSIS — E1122 Type 2 diabetes mellitus with diabetic chronic kidney disease: Secondary | ICD-10-CM | POA: Diagnosis not present

## 2019-09-30 DIAGNOSIS — E1165 Type 2 diabetes mellitus with hyperglycemia: Secondary | ICD-10-CM | POA: Diagnosis not present

## 2019-09-30 DIAGNOSIS — I739 Peripheral vascular disease, unspecified: Secondary | ICD-10-CM | POA: Diagnosis not present

## 2019-10-04 ENCOUNTER — Other Ambulatory Visit: Payer: Self-pay | Admitting: Physician Assistant

## 2019-10-05 ENCOUNTER — Other Ambulatory Visit: Payer: Self-pay | Admitting: Physician Assistant

## 2019-10-05 DIAGNOSIS — M25612 Stiffness of left shoulder, not elsewhere classified: Secondary | ICD-10-CM | POA: Diagnosis not present

## 2019-10-05 DIAGNOSIS — M19012 Primary osteoarthritis, left shoulder: Secondary | ICD-10-CM | POA: Diagnosis not present

## 2019-10-05 DIAGNOSIS — R29898 Other symptoms and signs involving the musculoskeletal system: Secondary | ICD-10-CM | POA: Diagnosis not present

## 2019-10-05 NOTE — Progress Notes (Signed)
Office Visit Note  Patient: [name]             Date of Birth: May 05, 1950           MRN: [id number]             PCP: [name], MD Referring: [name], MD Visit Date: 10/11/2019 Occupation: @GUAROCC @  Subjective:  Other (left shoulder pain )   History of Present Illness: [name] is a 69 y.o. male with history of seronegative rheumatoid arthritis.  He returns today after his last visit in January 2021.  He states he has multiple medical problems since his last visit and could not come for follow-up visit.  He states he continues to have pain and discomfort in his both shoulders.  He has been followed by orthopedic surgeon.  He has been advised surgery but he is not ready for surgery.  He has been going for physical therapy.  He continues to have pain and discomfort in his neck.  He also has discomfort in his hands and feet due to underlying osteoarthritis.  They he has noticed some improvement in the muscle cramps.  Activities of Daily Living:  Patient reports morning stiffness for 5 minutes.   Patient Reports nocturnal pain.  Difficulty dressing/grooming: Reports Difficulty climbing stairs: Reports Difficulty getting out of chair: Denies Difficulty using hands for taps, buttons, cutlery, and/or writing: Reports  Review of Systems  Constitutional: Positive for fatigue. Negative for night sweats.  HENT: Positive for mouth dryness. Negative for mouth sores and nose dryness.   Eyes: Negative for redness, itching and dryness.  Respiratory: Positive for difficulty breathing. Negative for shortness of breath.        History of COPD  Cardiovascular: Negative for chest pain, palpitations, hypertension, irregular heartbeat and swelling in legs/feet.  Gastrointestinal: Negative for blood in stool, constipation and diarrhea.  Endocrine: Negative for increased urination.  Genitourinary: Negative for difficulty urinating and painful urination.  Musculoskeletal: Positive  for arthralgias, joint pain, joint swelling, myalgias, morning stiffness, muscle tenderness and myalgias. Negative for muscle weakness.  Skin: Negative for color change, rash, hair loss, nodules/bumps, redness, skin tightness, ulcers and sensitivity to sunlight.  Allergic/Immunologic: Negative for susceptible to infections.  Neurological: Positive for numbness, headaches and weakness. Negative for dizziness, fainting, memory loss and night sweats.  Hematological: Positive for bruising/bleeding tendency. Negative for swollen glands.  Psychiatric/Behavioral: Negative for depressed mood, confusion and sleep disturbance. The patient is not nervous/anxious.     PMFS History:  Patient Active Problem List   Diagnosis Date Noted  . Puncture wound of finger of left hand 08/08/2018  . Puncture wound of finger, right, complicated, initial encounter 08/05/2018  . DOE (dyspnea on exertion) 11/04/2017  . Muscle cramps 07/04/2017  . Contracture of left elbow 07/04/2017  . High risk medication use 06/23/2017  . Primary osteoarthritis of both hands 06/23/2017  . Primary osteoarthritis of both feet 06/23/2017  . 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 factor ([place]) 06/23/2017  . DDD (degenerative disc disease), cervical 06/23/2017  . Rotator cuff syndrome of right shoulder 06/06/2017  . History of  bilateral carpal tunnel release 06/06/2017  . Ganglion cyst of wrist, left 06/06/2017  . Non-insulin dependent type 2 diabetes mellitus ([place]) 06/06/2017  . Chest pain with high risk for cardiac etiology 04/05/2017  . COPD with acute exacerbation ([place]) 03/13/2017  . Plantar fasciitis 02/21/2017  . Iliac artery stenosis, right ([place]) 02/12/2017  . Hematochezia 02/10/2017  .  Abnormal CT of the abdomen 02/10/2017  . Cervical spondylosis 10/11/2016  . Bony growth 10/13/2015  . Pain in joint, lower leg 03/02/2014  . Discoloration of skin-Left dorsum foot 09/28/2013  . Swelling of limb-Left  Calf / Leg 09/28/2013  . PVD (peripheral vascular disease) ([place]) 10/14/2012  . Pain in limb- Left popliteal and calf 10/14/2012  . Dyslipidemia, goal LDL below 70   . Essential hypertension 03/05/2012  . 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 Generalized osteoarthritis of multiple sites 02/06/2011  . History of arterial bypass of lower limb 12/25/2010  . Atherosclerosis of native arteries of extremity with intermittent claudication ([place]) 12/25/2010  . Coronary artery calcification seen on CT scan 04/16/2008  . COPD  GOLD II 04/16/2008  . GASTROESOPHAGEAL REFLUX DISEASE 04/16/2008  . Rheumatoid arthritis ([place]) 04/16/2008  . DIABETES MELLITUS, BORDERLINE 04/16/2008    Past Medical History:  Diagnosis Date  . Arthritis    RA  . Bronchitis   . Carotid artery disease ([place])   . Cervical spine disease    Neck surgery in 2007 with Dr. Joya Salm  . COPD (chronic obstructive pulmonary disease) ([place])   . Coronary atherosclerosis of native coronary artery    Nonobstrtuctive by catheterization 2007, 2013, 2021  . DVT (deep venous thrombosis) ([place])   . Essential hypertension   . GERD (gastroesophageal reflux disease)   . Hyperlipidemia   . PAD (peripheral artery disease) ([place])    s/p left fem-pop bypass 2007; PTA and stenting of right iliac artery 09/2011  . Rheumatoid arthritis(714.0)   . Type 2 diabetes mellitus (HCC)     Family History  Problem Relation Age of Onset  . Heart attack Mother 43  . Deep vein thrombosis Mother   . Diabetes Mother   . Hyperlipidemia Mother   . Hypertension Mother   . Peripheral vascular disease Mother   . Stroke Father   . Diabetes Father   . Hyperlipidemia Father   . Hypertension Father   . Peripheral vascular disease Father   . Diabetes Sister   . Hyperlipidemia Sister   . Hypertension Sister   . Diabetes Brother   . Hyperlipidemia Brother   . Cancer Brother   . Deep vein thrombosis Brother   . Hypertension Brother   . Peripheral  vascular disease Brother   . Hyperlipidemia Son   . Cancer Son   . Hypertension Son   . Colon cancer Neg Hx    Past Surgical History:  Procedure Laterality Date  . ABDOMINAL AORTAGRAM N/A 10/03/2011   Procedure: ABDOMINAL Maxcine Ham;  Surgeon: Conrad [place], MD;  Location: [facility] CATH LAB;  Service: Cardiovascular;  Laterality: N/A;  . ABDOMINAL AORTOGRAM W/LOWER EXTREMITY N/A 01/16/2017   Procedure: ABDOMINAL AORTOGRAM W/LOWER EXTREMITY;  Surgeon: Conrad [place], MD;  Location: [facility] CV LAB;  Service: Cardiovascular;  Laterality: N/A;  . ABDOMINAL AORTOGRAM W/LOWER EXTREMITY Bilateral 08/04/2019   Procedure: ABDOMINAL AORTOGRAM W/LOWER EXTREMITY;  Surgeon: Marty Heck, MD;  Location: [facility] CV LAB;  Service: Cardiovascular;  Laterality: Bilateral;  . CERVICAL DISC SURGERY    . COLONOSCOPY WITH PROPOFOL N/A 02/17/2017   Procedure: COLONOSCOPY WITH PROPOFOL;  Surgeon: Daneil Dolin, MD;  Location[facility];  Service: Endoscopy;  Laterality: N/A;  2:45pm  . FEMORAL-POPLITEAL BYPASS GRAFT  11/06/2005  . ILIAC ARTERY STENT  10-03-11   Right CIA stenting  . LEFT HEART CATH AND CORONARY ANGIOGRAPHY N/A 04/14/2019   Procedure: LEFT HEART CATH AND CORONARY ANGIOGRAPHY;  Surgeon:  Troy Sine, MD;  Location: [facility] LAB;  Service: Cardiovascular;  Laterality: N/A;  . PERIPHERAL VASCULAR BALLOON ANGIOPLASTY Right 01/16/2017   Procedure: PERIPHERAL VASCULAR BALLOON ANGIOPLASTY;  Surgeon: Conrad , MD;  Location: [facility] LAB;  Service: Cardiovascular;  Laterality: Right;  common iliac   . PR VEIN BYPASS GRAFT,AORTO-FEM-POP  06-22-2010   Redo Left Fem-Pop  . Repair of left arm fracture    . 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 SURGERY SCROTAL / TESTICULAR     Social History   Social History Narrative   The patient lives in [place].    Immunization History  Administered Date(s) Administered  . Influenza, High Dose Seasonal PF 10/14/2016, 09/21/2018  .  Influenza-Unspecified 11/08/2008  . Moderna SARS-COVID-2 Vaccination 02/19/2019, 03/22/2019  . PPD Test 09/22/2007  . Tdap 08/05/2018     Objective: Vital Signs: BP 131/83 (BP Location: Left Arm, Patient Position: Sitting, Cuff Size: Normal)   Pulse 96   Resp 15   Ht 5\' 10"  (1.778 m)   Wt 173 lb (78.5 kg)   BMI 24.82 kg/m    Physical Exam Vitals and nursing note reviewed.  Constitutional:      Appearance: He is well-developed.  HENT:     Head: Normocephalic and atraumatic.  Eyes:     Conjunctiva/sclera: Conjunctivae normal.     Pupils: Pupils are equal, round, and reactive to light.  Cardiovascular:     Rate and Rhythm: Normal rate and regular rhythm.     Heart sounds: Normal heart sounds.  Pulmonary:     Effort: Pulmonary effort is normal.     Breath sounds: Normal breath sounds.  Abdominal:     General: Bowel sounds are normal.     Palpations: Abdomen is soft.  Musculoskeletal:     Cervical back: Normal range of motion and neck supple.  Skin:    General: Skin is warm and dry.     Capillary Refill: Capillary refill takes less than 2 seconds.  Neurological:     Mental Status: He is alert and oriented to person, place, and time.  Psychiatric:        Behavior: Behavior normal.       Musculoskeletal Exam: He has good range of motion of his cervical spine.  He has painful range of motion of bilateral shoulder joints.  He is old contracture in his left elbow joint.  He is synovial thickening over MCPs and PIPs but no synovitis was noted.  Amputation of right fifth distal phalanx was noted.  PIP and DIP thickening was noted.  Hip joints, knee joints, ankles, MTPs and PIPs were in good range of motion without discomfort.  CDAI Exam: CDAI Score: 2.8  Patient Global: 4 mm; Provider Global: 4 mm Swollen: 0 ; Tender: 2  Joint Exam 10/11/2019      Right  Left  Glenohumeral   Tender   Tender     Investigation: No additional findings.  Imaging: No results  found.  Recent Labs: Lab Results  Component Value Date   WBC 5.8 04/13/2019   HGB 14.3 08/04/2019   PLT 283 04/13/2019   NA 143 08/04/2019   K 4.1 08/04/2019   CL 103 08/04/2019   CO2 27 04/13/2019   GLUCOSE 197 (H) 08/04/2019   BUN 15 08/04/2019   CREATININE 0.80 08/04/2019   BILITOT 0.2 01/15/2019   ALKPHOS 78 01/15/2019   AST 25 01/15/2019   ALT 23 01/15/2019   PROT 6.9  01/15/2019   ALBUMIN 4.3 01/15/2019   CALCIUM 9.3 04/13/2019   GFRAA >60 04/13/2019   QFTBGOLDPLUS NEGATIVE 08/06/2018    Speciality Comments: Prior therapy includes: methotrexate ( d/c alcohol use), Arava (d/c patient preference), [place] (patient declined)  Procedures:  No procedures performed Allergies: Patient has no known allergies.   Assessment / Plan:     Visit Diagnoses: 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 factor (HCC)-he had no synovitis on my examination.  He has old damage from rheumatoid arthritis.  His symptoms are very well controlled on Xeljanz 5 mg p.o. daily which was started at [facility]  Noncompliance with follow-up and labs was discussed today at length.  We will try to see him every 3 months so he can get labs and follow-up visits.  High risk medication use - Xeljanz 5 mg 1 tablet by mouth daily.  Methotrexate discontinued due to alcohol use, Orencia declined by patient, treated with Morrie Sheldon initially at [facility]  -he has not had labs since April 2021.  Risk of toxicity from the medications were emphasized.  Getting labs on a regular basis was also emphasized.  Plan: CBC with Differential/Platelet, COMPLETE METABOLIC PANEL WITH GFR, QuantiFERON-TB Gold Plus today and then every 3 months to monitor for drug toxicity.  Contracture of left elbow - S/p previous injury/surgery-Unchanged.   Primary osteoarthritis of both hands-joint protection and muscle strengthening was discussed.  Primary osteoarthritis of both feet-proper fitting shoes were  discussed.  DDD (degenerative disc disease), cervical-he has some stiffness with range of motion of cervical spine.  Rotator cuff syndrome of right shoulder-he has been followed by orthopedic surgeon.  He states he has pain and discomfort in his bilateral shoulders.  He is not doing any exercises at this time.  Have given him a handout on shoulder joint exercises.  Muscle cramps-improved.  Other medical problems are listed as follows:  History of  bilateral carpal tunnel release  Ganglion cyst of wrist, left  Mixed hyperlipidemia  PVD (peripheral vascular disease) (HCC)  Atherosclerosis of native coronary artery of native heart without angina pectoris  COPD  GOLD II - Former smoker  Essential hypertension  History of diabetes mellitus - with stage 3 chronic kidney disease, without long-term current use of insulin  Educated about COVID-19 virus infection-he is fully vaccinated against COVID-19.  Have advised him to get a booster when it is available to him.  Use of mask, social distancing and hand hygiene was discussed.  He was also advised that he may be eligible for COVID-19 monoclonal antibody infusion in case he gets the infection.  Orders: Orders Placed This Encounter  Procedures  . CBC with Differential/Platelet  . COMPLETE METABOLIC PANEL WITH GFR  . QuantiFERON-TB Gold Plus   No orders of the defined types were placed in this encounter.    Follow-Up Instructions: Return in about 3 months (around 01/11/2020) for Rheumatoid arthritis.   Bo Merino, MD  Note - This record has been created using Editor, commissioning.  Chart creation errors have been sought, but may not always  have been located. Such creation errors do not reflect on  the standard of medical care.

## 2019-10-07 DIAGNOSIS — E119 Type 2 diabetes mellitus without complications: Secondary | ICD-10-CM | POA: Diagnosis not present

## 2019-10-07 DIAGNOSIS — R29898 Other symptoms and signs involving the musculoskeletal system: Secondary | ICD-10-CM | POA: Diagnosis not present

## 2019-10-07 DIAGNOSIS — I10 Essential (primary) hypertension: Secondary | ICD-10-CM | POA: Diagnosis not present

## 2019-10-07 DIAGNOSIS — M25612 Stiffness of left shoulder, not elsewhere classified: Secondary | ICD-10-CM | POA: Diagnosis not present

## 2019-10-07 DIAGNOSIS — E1165 Type 2 diabetes mellitus with hyperglycemia: Secondary | ICD-10-CM | POA: Diagnosis not present

## 2019-10-07 DIAGNOSIS — M19012 Primary osteoarthritis, left shoulder: Secondary | ICD-10-CM | POA: Diagnosis not present

## 2019-10-11 ENCOUNTER — Ambulatory Visit (INDEPENDENT_AMBULATORY_CARE_PROVIDER_SITE_OTHER): Payer: Medicare Other | Admitting: Rheumatology

## 2019-10-11 ENCOUNTER — Encounter: Payer: Self-pay | Admitting: Rheumatology

## 2019-10-11 ENCOUNTER — Other Ambulatory Visit: Payer: Self-pay

## 2019-10-11 VITALS — BP 131/83 | HR 96 | Resp 15 | Ht 70.0 in | Wt 173.0 lb

## 2019-10-11 DIAGNOSIS — Z8639 Personal history of other endocrine, nutritional and metabolic disease: Secondary | ICD-10-CM

## 2019-10-11 DIAGNOSIS — M19042 Primary osteoarthritis, left hand: Secondary | ICD-10-CM

## 2019-10-11 DIAGNOSIS — M0609 Rheumatoid arthritis without rheumatoid factor, multiple sites: Secondary | ICD-10-CM | POA: Diagnosis not present

## 2019-10-11 DIAGNOSIS — Z7189 Other specified counseling: Secondary | ICD-10-CM

## 2019-10-11 DIAGNOSIS — M67432 Ganglion, left wrist: Secondary | ICD-10-CM

## 2019-10-11 DIAGNOSIS — E782 Mixed hyperlipidemia: Secondary | ICD-10-CM

## 2019-10-11 DIAGNOSIS — M19041 Primary osteoarthritis, right hand: Secondary | ICD-10-CM | POA: Diagnosis not present

## 2019-10-11 DIAGNOSIS — M24522 Contracture, left elbow: Secondary | ICD-10-CM

## 2019-10-11 DIAGNOSIS — M75101 Unspecified rotator cuff tear or rupture of right shoulder, not specified as traumatic: Secondary | ICD-10-CM

## 2019-10-11 DIAGNOSIS — R252 Cramp and spasm: Secondary | ICD-10-CM

## 2019-10-11 DIAGNOSIS — M19071 Primary osteoarthritis, right ankle and foot: Secondary | ICD-10-CM

## 2019-10-11 DIAGNOSIS — I10 Essential (primary) hypertension: Secondary | ICD-10-CM

## 2019-10-11 DIAGNOSIS — I739 Peripheral vascular disease, unspecified: Secondary | ICD-10-CM

## 2019-10-11 DIAGNOSIS — M5030 Other cervical disc degeneration, unspecified cervical region: Secondary | ICD-10-CM

## 2019-10-11 DIAGNOSIS — J449 Chronic obstructive pulmonary disease, unspecified: Secondary | ICD-10-CM

## 2019-10-11 DIAGNOSIS — M19072 Primary osteoarthritis, left ankle and foot: Secondary | ICD-10-CM

## 2019-10-11 DIAGNOSIS — Z79899 Other long term (current) drug therapy: Secondary | ICD-10-CM

## 2019-10-11 DIAGNOSIS — I2510 Atherosclerotic heart disease of native coronary artery without angina pectoris: Secondary | ICD-10-CM

## 2019-10-11 DIAGNOSIS — Z9889 Other specified postprocedural states: Secondary | ICD-10-CM

## 2019-10-11 NOTE — Patient Instructions (Addendum)
Standing Labs We placed an order today for your standing lab work.   Please have your standing labs drawn in today and every 3 months  If possible, please have your labs drawn 2 weeks prior to your appointment so that the provider can discuss your results at your appointment.  We have open lab daily Monday through Thursday from 8:30-12:30 PM and 1:30-4:30 PM and Friday from 8:30-12:30 PM and 1:30-4:00 PM at the office of Dr. Bo Merino, [facility].   Please be advised, patients with office appointments requiring lab work will take precedents over walk-in lab work.  If possible, please come for your lab work on Monday and Friday afternoons, as you may experience shorter wait times. The office is located at [address], [place], [place], [place] [address] No appointment is necessary.   Labs are drawn by [facility]. Please bring your co-pay at the time of your lab draw.  You may receive a bill from [facility] for your lab work.  If you wish to have your labs drawn at another location, please call the office 24 hours in advance to send orders.  If you have any questions regarding directions or hours of operation,  please call [phone].   As a reminder, please drink plenty of water prior to coming for your lab work. Thanks!  Shoulder Exercises Ask your health care provider which exercises are safe for you. Do exercises exactly as told by your health care provider and adjust them as directed. It is normal to feel mild stretching, pulling, tightness, or discomfort as you do these exercises. Stop right away if you feel sudden pain or your pain gets worse. Do not begin these exercises until told by your health care provider. Stretching exercises External rotation and abduction This exercise is sometimes called corner stretch. This exercise rotates your arm outward (external rotation) and moves your arm out from your body (abduction). 1. Stand in a doorway with one of your feet  slightly in front of the other. This is called a staggered stance. If you cannot reach your forearms to the door frame, stand facing a corner of a room. 2. Choose one of the following positions as told by your health care provider: ? Place your hands and forearms on the door frame above your head. ? Place your hands and forearms on the door frame at the height of your head. ? Place your hands on the door frame at the height of your elbows. 3. Slowly move your weight onto your front foot until you feel a stretch across your chest and in the front of your shoulders. Keep your head and chest upright and keep your abdominal muscles tight. 4. Hold for __________ seconds. 5. To release the stretch, shift your weight to your back foot. Repeat __________ times. Complete this exercise __________ times a day. Extension, standing 1. Stand and hold a broomstick, a cane, or a similar object behind your back. ? Your hands should be a little wider than shoulder width apart. ? Your palms should face away from your back. 2. Keeping your elbows straight and your shoulder muscles relaxed, move the stick away from your body until you feel a stretch in your shoulders (extension). ? Avoid shrugging your shoulders while you move the stick. Keep your shoulder blades tucked down toward the middle of your back. 3. Hold for __________ seconds. 4. Slowly return to the starting position. Repeat __________ times. Complete this exercise __________ times a day. Range-of-motion exercises Pendulum  1. Stand near a wall or a surface that you can hold onto for balance. 2. Bend at the waist and let your left / right arm hang straight down. Use your other arm to support you. Keep your back straight and do not lock your knees. 3. Relax your left / right arm and shoulder muscles, and move your hips and your trunk so your left / right arm swings freely. Your arm should swing because of the motion of your body, not because you are  using your arm or shoulder muscles. 4. Keep moving your hips and trunk so your arm swings in the following directions, as told by your health care provider: ? Side to side. ? Forward and backward. ? In clockwise and counterclockwise circles. 5. Continue each motion for __________ seconds, or for as long as told by your health care provider. 6. Slowly return to the starting position. Repeat __________ times. Complete this exercise __________ times a day. Shoulder flexion, standing  1. Stand and hold a broomstick, a cane, or a similar object. Place your hands a little more than shoulder width apart on the object. Your left / right hand should be palm up, and your other hand should be palm down. 2. Keep your elbow straight and your shoulder muscles relaxed. Push the stick up with your healthy arm to raise your left / right arm in front of your body, and then over your head until you feel a stretch in your shoulder (flexion). ? Avoid shrugging your shoulder while you raise your arm. Keep your shoulder blade tucked down toward the middle of your back. 3. Hold for __________ seconds. 4. Slowly return to the starting position. Repeat __________ times. Complete this exercise __________ times a day. Shoulder abduction, standing 1. Stand and hold a broomstick, a cane, or a similar object. Place your hands a little more than shoulder width apart on the object. Your left / right hand should be palm up, and your other hand should be palm down. 2. Keep your elbow straight and your shoulder muscles relaxed. Push the object across your body toward your left / right side. Raise your left / right arm to the side of your body (abduction) until you feel a stretch in your shoulder. ? Do not raise your arm above shoulder height unless your health care provider tells you to do that. ? If directed, raise your arm over your head. ? Avoid shrugging your shoulder while you raise your arm. Keep your shoulder blade tucked  down toward the middle of your back. 3. Hold for __________ seconds. 4. Slowly return to the starting position. Repeat __________ times. Complete this exercise __________ times a day. Internal rotation  1. Place your left / right hand behind your back, palm up. 2. Use your other hand to dangle an exercise band, a towel, or a similar object over your shoulder. Grasp the band with your left / right hand so you are holding on to both ends. 3. Gently pull up on the band until you feel a stretch in the front of your left / right shoulder. The movement of your arm toward the center of your body is called internal rotation. ? Avoid shrugging your shoulder while you raise your arm. Keep your shoulder blade tucked down toward the middle of your back. 4. Hold for __________ seconds. 5. Release the stretch by letting go of the band and lowering your hands. Repeat __________ times. Complete this exercise __________ times a day. Strengthening exercises  External rotation  1. Sit in a stable chair without armrests. 2. Secure an exercise band to a stable object at elbow height on your left / right side. 3. Place a soft object, such as a folded towel or a small pillow, between your left / right upper arm and your body to move your elbow about 4 inches (10 cm) away from your side. 4. Hold the end of the exercise band so it is tight and there is no slack. 5. Keeping your elbow pressed against the soft object, slowly move your forearm out, away from your abdomen (external rotation). Keep your body steady so only your forearm moves. 6. Hold for __________ seconds. 7. Slowly return to the starting position. Repeat __________ times. Complete this exercise __________ times a day. Shoulder abduction  1. Sit in a stable chair without armrests, or stand up. 2. Hold a __________ weight in your left / right hand, or hold an exercise band with both hands. 3. Start with your arms straight down and your left / right  palm facing in, toward your body. 4. Slowly lift your left / right hand out to your side (abduction). Do not lift your hand above shoulder height unless your health care provider tells you that this is safe. ? Keep your arms straight. ? Avoid shrugging your shoulder while you do this movement. Keep your shoulder blade tucked down toward the middle of your back. 5. Hold for __________ seconds. 6. Slowly lower your arm, and return to the starting position. Repeat __________ times. Complete this exercise __________ times a day. Shoulder extension 1. Sit in a stable chair without armrests, or stand up. 2. Secure an exercise band to a stable object in front of you so it is at shoulder height. 3. Hold one end of the exercise band in each hand. Your palms should face each other. 4. Straighten your elbows and lift your hands up to shoulder height. 5. Step back, away from the secured end of the exercise band, until the band is tight and there is no slack. 6. Squeeze your shoulder blades together as you pull your hands down to the sides of your thighs (extension). Stop when your hands are straight down by your sides. Do not let your hands go behind your body. 7. Hold for __________ seconds. 8. Slowly return to the starting position. Repeat __________ times. Complete this exercise __________ times a day. Shoulder row 1. Sit in a stable chair without armrests, or stand up. 2. Secure an exercise band to a stable object in front of you so it is at waist height. 3. Hold one end of the exercise band in each hand. Position your palms so that your thumbs are facing the ceiling (neutral position). 4. Bend each of your elbows to a 90-degree angle (right angle) and keep your upper arms at your sides. 5. Step back until the band is tight and there is no slack. 6. Slowly pull your elbows back behind you. 7. Hold for __________ seconds. 8. Slowly return to the starting position. Repeat __________ times. Complete  this exercise __________ times a day. Shoulder press-ups  1. Sit in a stable chair that has armrests. Sit upright, with your feet flat on the floor. 2. Put your hands on the armrests so your elbows are bent and your fingers are pointing forward. Your hands should be about even with the sides of your body. 3. Push down on the armrests and use your arms to lift yourself off the  chair. Straighten your elbows and lift yourself up as much as you comfortably can. ? Move your shoulder blades down, and avoid letting your shoulders move up toward your ears. ? Keep your feet on the ground. As you get stronger, your feet should support less of your body weight as you lift yourself up. 4. Hold for __________ seconds. 5. Slowly lower yourself back into the chair. Repeat __________ times. Complete this exercise __________ times a day. Wall push-ups  1. Stand so you are facing a stable wall. Your feet should be about one arm-length away from the wall. 2. Lean forward and place your palms on the wall at shoulder height. 3. Keep your feet flat on the floor as you bend your elbows and lean forward toward the wall. 4. Hold for __________ seconds. 5. Straighten your elbows to push yourself back to the starting position. Repeat __________ times. Complete this exercise __________ times a day. This information is not intended to replace advice given to you by your health care provider. Make sure you discuss any questions you have with your health care provider. Document Revised: 04/17/2018 Document Reviewed: 01/23/2018 [employer] Patient Education  [employer].

## 2019-10-12 DIAGNOSIS — R29898 Other symptoms and signs involving the musculoskeletal system: Secondary | ICD-10-CM | POA: Diagnosis not present

## 2019-10-12 DIAGNOSIS — M25612 Stiffness of left shoulder, not elsewhere classified: Secondary | ICD-10-CM | POA: Diagnosis not present

## 2019-10-12 DIAGNOSIS — M19012 Primary osteoarthritis, left shoulder: Secondary | ICD-10-CM | POA: Diagnosis not present

## 2019-10-14 DIAGNOSIS — R29898 Other symptoms and signs involving the musculoskeletal system: Secondary | ICD-10-CM | POA: Diagnosis not present

## 2019-10-14 DIAGNOSIS — M19012 Primary osteoarthritis, left shoulder: Secondary | ICD-10-CM | POA: Diagnosis not present

## 2019-10-14 DIAGNOSIS — M25612 Stiffness of left shoulder, not elsewhere classified: Secondary | ICD-10-CM | POA: Diagnosis not present

## 2019-10-15 ENCOUNTER — Ambulatory Visit: Payer: Medicare Other | Admitting: Podiatry

## 2019-10-19 ENCOUNTER — Other Ambulatory Visit: Payer: Self-pay | Admitting: Rheumatology

## 2019-10-19 DIAGNOSIS — Z79899 Other long term (current) drug therapy: Secondary | ICD-10-CM | POA: Diagnosis not present

## 2019-10-19 DIAGNOSIS — R29898 Other symptoms and signs involving the musculoskeletal system: Secondary | ICD-10-CM | POA: Diagnosis not present

## 2019-10-19 DIAGNOSIS — M19012 Primary osteoarthritis, left shoulder: Secondary | ICD-10-CM | POA: Diagnosis not present

## 2019-10-19 DIAGNOSIS — M25612 Stiffness of left shoulder, not elsewhere classified: Secondary | ICD-10-CM | POA: Diagnosis not present

## 2019-10-21 LAB — COMPREHENSIVE METABOLIC PANEL
ALT: 16 IU/L (ref 0–44)
AST: 18 IU/L (ref 0–40)
Albumin/Globulin Ratio: 1.9 (ref 1.2–2.2)
Albumin: 4.4 g/dL (ref 3.8–4.8)
Alkaline Phosphatase: 79 IU/L (ref 44–121)
BUN/Creatinine Ratio: 19 (ref 10–24)
BUN: 19 mg/dL (ref 8–27)
Bilirubin Total: 0.2 mg/dL (ref 0.0–1.2)
CO2: 24 mmol/L (ref 20–29)
Calcium: 9.3 mg/dL (ref 8.6–10.2)
Chloride: 103 mmol/L (ref 96–106)
Creatinine, Ser: 0.99 mg/dL (ref 0.76–1.27)
GFR calc Af Amer: 90 mL/min/{1.73_m2} (ref >59)
GFR calc non Af Amer: 78 mL/min/{1.73_m2} (ref >59)
Globulin, Total: 2.3 g/dL (ref 1.5–4.5)
Glucose: 186 mg/dL — ABNORMAL HIGH (ref 65–99)
Potassium: 4.5 mmol/L (ref 3.5–5.2)
Sodium: 141 mmol/L (ref 134–144)
Total Protein: 6.7 g/dL (ref 6.0–8.5)

## 2019-10-21 LAB — CBC/DIFF AMBIGUOUS DEFAULT
Basophils Absolute: 0.1 10*3/uL (ref 0.0–0.2)
Basos: 1 %
EOS (ABSOLUTE): 0.1 10*3/uL (ref 0.0–0.4)
Eos: 2 %
Hematocrit: 37.6 % (ref 37.5–51.0)
Hemoglobin: 12.2 g/dL — ABNORMAL LOW (ref 13.0–17.7)
Immature Grans (Abs): 0 10*3/uL (ref 0.0–0.1)
Immature Granulocytes: 0 %
Lymphocytes Absolute: 1.7 10*3/uL (ref 0.7–3.1)
Lymphs: 26 %
MCH: 29.1 pg (ref 26.6–33.0)
MCHC: 32.4 g/dL (ref 31.5–35.7)
MCV: 90 fL (ref 79–97)
Monocytes Absolute: 0.4 10*3/uL (ref 0.1–0.9)
Monocytes: 6 %
Neutrophils Absolute: 4.2 10*3/uL (ref 1.4–7.0)
Neutrophils: 65 %
Platelets: 308 10*3/uL (ref 150–450)
RBC: 4.19 x10E6/uL (ref 4.14–5.80)
RDW: 14.1 % (ref 11.6–15.4)
WBC: 6.4 10*3/uL (ref 3.4–10.8)

## 2019-10-21 LAB — SPECIMEN STATUS REPORT

## 2019-10-21 LAB — QUANTIFERON-TB GOLD PLUS
QuantiFERON Mitogen Value: >10 IU/mL
QuantiFERON Nil Value: 0.01 IU/mL
QuantiFERON TB1 Ag Value: 0.03 IU/mL
QuantiFERON TB2 Ag Value: 0.03 IU/mL
QuantiFERON-TB Gold Plus: NEGATIVE

## 2019-10-25 NOTE — Progress Notes (Signed)
Mild anemia noted which is a stable.  Glucose is elevated.  TB gold is negative.

## 2019-10-27 DIAGNOSIS — M19012 Primary osteoarthritis, left shoulder: Secondary | ICD-10-CM | POA: Diagnosis not present

## 2019-10-28 DIAGNOSIS — R29898 Other symptoms and signs involving the musculoskeletal system: Secondary | ICD-10-CM | POA: Diagnosis not present

## 2019-10-28 DIAGNOSIS — M19012 Primary osteoarthritis, left shoulder: Secondary | ICD-10-CM | POA: Diagnosis not present

## 2019-10-28 DIAGNOSIS — M25612 Stiffness of left shoulder, not elsewhere classified: Secondary | ICD-10-CM | POA: Diagnosis not present

## 2019-11-04 DIAGNOSIS — M47812 Spondylosis without myelopathy or radiculopathy, cervical region: Secondary | ICD-10-CM | POA: Diagnosis not present

## 2019-11-04 DIAGNOSIS — M436 Torticollis: Secondary | ICD-10-CM | POA: Diagnosis not present

## 2019-11-04 DIAGNOSIS — M19012 Primary osteoarthritis, left shoulder: Secondary | ICD-10-CM | POA: Diagnosis not present

## 2019-11-05 DIAGNOSIS — E119 Type 2 diabetes mellitus without complications: Secondary | ICD-10-CM | POA: Diagnosis not present

## 2019-11-05 DIAGNOSIS — I10 Essential (primary) hypertension: Secondary | ICD-10-CM | POA: Diagnosis not present

## 2019-11-06 DIAGNOSIS — E1165 Type 2 diabetes mellitus with hyperglycemia: Secondary | ICD-10-CM | POA: Diagnosis not present

## 2019-11-06 DIAGNOSIS — I10 Essential (primary) hypertension: Secondary | ICD-10-CM | POA: Diagnosis not present

## 2019-11-09 ENCOUNTER — Other Ambulatory Visit: Payer: Self-pay | Admitting: Cardiology

## 2019-11-09 DIAGNOSIS — Z79899 Other long term (current) drug therapy: Secondary | ICD-10-CM | POA: Diagnosis not present

## 2019-11-09 DIAGNOSIS — M5412 Radiculopathy, cervical region: Secondary | ICD-10-CM | POA: Diagnosis not present

## 2019-11-09 DIAGNOSIS — M47812 Spondylosis without myelopathy or radiculopathy, cervical region: Secondary | ICD-10-CM | POA: Diagnosis not present

## 2019-11-09 DIAGNOSIS — E782 Mixed hyperlipidemia: Secondary | ICD-10-CM | POA: Diagnosis not present

## 2019-11-10 LAB — SPECIMEN STATUS REPORT

## 2019-11-10 LAB — LIPID PANEL W/O CHOL/HDL RATIO
Cholesterol, Total: 160 mg/dL (ref 100–199)
HDL: 61 mg/dL (ref >39)
LDL Chol Calc (NIH): 87 mg/dL (ref 0–99)
Triglycerides: 59 mg/dL (ref 0–149)
VLDL Cholesterol Cal: 12 mg/dL (ref 5–40)

## 2019-11-12 ENCOUNTER — Telehealth: Payer: Self-pay | Admitting: *Deleted

## 2019-11-12 NOTE — Telephone Encounter (Signed)
-----   Message from Satira Sark, MD sent at 11/11/2019 12:43 PM EDT ----- Results reviewed.  Follow-up LDL is actually up to 87.  If he has been consistent with Lipitor 40 mg daily, let's increase this to 80 mg daily.  Check FLP in 6 months.

## 2019-11-16 DIAGNOSIS — E1143 Type 2 diabetes mellitus with diabetic autonomic (poly)neuropathy: Secondary | ICD-10-CM | POA: Diagnosis not present

## 2019-11-16 DIAGNOSIS — M436 Torticollis: Secondary | ICD-10-CM | POA: Diagnosis not present

## 2019-11-16 DIAGNOSIS — M47812 Spondylosis without myelopathy or radiculopathy, cervical region: Secondary | ICD-10-CM | POA: Diagnosis not present

## 2019-11-16 DIAGNOSIS — M19012 Primary osteoarthritis, left shoulder: Secondary | ICD-10-CM | POA: Diagnosis not present

## 2019-11-16 DIAGNOSIS — E1159 Type 2 diabetes mellitus with other circulatory complications: Secondary | ICD-10-CM | POA: Diagnosis not present

## 2019-11-17 NOTE — Telephone Encounter (Signed)
Patient informed and says she has not been taking lipitor daily but will start. Copy sent to PCP

## 2019-12-07 ENCOUNTER — Ambulatory Visit: Payer: Medicare Other

## 2019-12-07 ENCOUNTER — Encounter (HOSPITAL_COMMUNITY): Payer: Medicare Other

## 2019-12-07 DIAGNOSIS — E119 Type 2 diabetes mellitus without complications: Secondary | ICD-10-CM | POA: Diagnosis not present

## 2019-12-07 DIAGNOSIS — I10 Essential (primary) hypertension: Secondary | ICD-10-CM | POA: Diagnosis not present

## 2019-12-07 DIAGNOSIS — E1165 Type 2 diabetes mellitus with hyperglycemia: Secondary | ICD-10-CM | POA: Diagnosis not present

## 2019-12-08 ENCOUNTER — Ambulatory Visit (HOSPITAL_COMMUNITY)
Admission: RE | Admit: 2019-12-08 | Discharge: 2019-12-08 | Disposition: A | Discharge status: Home / Self Care | Payer: Medicare Other | Source: Ambulatory Visit | Attending: Vascular Surgery | Admitting: Vascular Surgery

## 2019-12-08 ENCOUNTER — Ambulatory Visit (INDEPENDENT_AMBULATORY_CARE_PROVIDER_SITE_OTHER): Payer: Medicare Other | Admitting: Physician Assistant

## 2019-12-08 ENCOUNTER — Other Ambulatory Visit (HOSPITAL_COMMUNITY): Payer: Self-pay | Admitting: Vascular Surgery

## 2019-12-08 ENCOUNTER — Other Ambulatory Visit: Payer: Self-pay

## 2019-12-08 VITALS — BP 137/83 | HR 96 | Temp 98.2°F | Ht 69.0 in | Wt 174.1 lb

## 2019-12-08 DIAGNOSIS — I779 Disorder of arteries and arterioles, unspecified: Secondary | ICD-10-CM | POA: Diagnosis not present

## 2019-12-08 NOTE — Progress Notes (Signed)
HISTORY AND PHYSICAL     CC:  follow up. Requesting Provider:  [name], MD  HPI: This is a 69 y.o. male who is here today for follow up for PAD.  He had aortogram in July 2021 for right leg claudication.  The study was significant for tibial disease on the right and since he did not have critical limb ischemia, no intervention was performed.  He was started on Pletal with improvement in symptoms.  He iss/p left common femoral toabove kneepoplitealbypass with Propaten in 2007 by Dr. Kellie Simmering and then a redo left common femoral to above kneepoplitealbypass withPropaten in 2012. In addition he has undergone a subintimal angioplasty of his right common iliac artery in 2013 with a drug-coated balloon angioplasty in 2019 by Dr. Bridgett Larsson.   Pt was last seen 09/07/2019 and at that time, he was having some numbness in the right foot and it was thought to be due to diabetic neuropathy with evidence of small vessel disease on arteriogram.  He was advised to wear a diabetic shoe and inspect feet daily.  He had been started on Pletal and was getting some improvement with this.  He was scheduled for 3 month follow up.    The pt returns today for that visit.  He states that the Pletal continues to help.  He does c/o some pain in the right shin just distal to knee and lateral.  He states that it gets a little better with walking.  He does not have any non healing wounds or rest pain.  He walks on the treadmill every morning for about 20-30 minutes.  He states he can walk this continuously unless he is on an incline.  He denies any cramping in his calves or thighs.    The pt is on a statin for cholesterol management.    The pt is on an aspirin.    Other AC:  Plavix The pt is on ARB for hypertension.  The pt does have diabetes. Tobacco hx:  former  Pt does not have family hx of AAA.  Past Medical History:  Diagnosis Date  . Arthritis    RA  . Bronchitis   . Carotid artery disease ([place])   .  Cervical spine disease    Neck surgery in 2007 with Dr. Joya Salm  . COPD (chronic obstructive pulmonary disease) ([place])   . Coronary atherosclerosis of native coronary artery    Nonobstrtuctive by catheterization 2007, 2013, 2021  . DVT (deep venous thrombosis) ([place])   . Essential hypertension   . GERD (gastroesophageal reflux disease)   . Hyperlipidemia   . PAD (peripheral artery disease) ([place])    s/p left fem-pop bypass 2007; PTA and stenting of right iliac artery 09/2011  . Rheumatoid arthritis(714.0)   . Type 2 diabetes mellitus ([place])     Past Surgical History:  Procedure Laterality Date  . ABDOMINAL AORTAGRAM N/A 10/03/2011   Procedure: ABDOMINAL Maxcine Ham;  Surgeon: Conrad [place], MD;  Location: [facility] CATH LAB;  Service: Cardiovascular;  Laterality: N/A;  . ABDOMINAL AORTOGRAM W/LOWER EXTREMITY N/A 01/16/2017   Procedure: ABDOMINAL AORTOGRAM W/LOWER EXTREMITY;  Surgeon: Conrad [place], MD;  Location: [place] CV LAB;  Service: Cardiovascular;  Laterality: N/A;  . ABDOMINAL AORTOGRAM W/LOWER EXTREMITY Bilateral 08/04/2019   Procedure: ABDOMINAL AORTOGRAM W/LOWER EXTREMITY;  Surgeon: Marty Heck, MD;  Location: [place] CV LAB;  Service: Cardiovascular;  Laterality: Bilateral;  . CERVICAL DISC SURGERY    . COLONOSCOPY WITH PROPOFOL N/A  02/17/2017   Procedure: COLONOSCOPY WITH PROPOFOL;  Surgeon: Daneil Dolin, MD;  Location: [facility];  Service: Endoscopy;  Laterality: N/A;  2:45pm  . FEMORAL-POPLITEAL BYPASS GRAFT  11/06/2005  . ILIAC ARTERY STENT  10-03-11   Right CIA stenting  . LEFT HEART CATH AND CORONARY ANGIOGRAPHY N/A 04/14/2019   Procedure: LEFT HEART CATH AND CORONARY ANGIOGRAPHY;  Surgeon: Troy Sine, MD;  Location: [place] CV LAB;  Service: Cardiovascular;  Laterality: N/A;  . PERIPHERAL VASCULAR BALLOON ANGIOPLASTY Right 01/16/2017   Procedure: PERIPHERAL VASCULAR BALLOON ANGIOPLASTY;  Surgeon: Conrad [place], MD;  Location: [place] CV LAB;  Service:  Cardiovascular;  Laterality: Right;  common iliac   . PR VEIN BYPASS GRAFT,AORTO-FEM-POP  06-22-2010   Redo Left Fem-Pop  . Repair of left arm fracture    . 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 SURGERY SCROTAL / TESTICULAR      No Known Allergies  Current Outpatient Medications  Medication Sig Dispense Refill  . aspirin EC 81 MG tablet Take 1 tablet (81 mg total) by mouth daily.    Marland Kitchen atorvastatin (LIPITOR) 40 MG tablet Take 1 tablet (40 mg total) by mouth daily. 90 tablet 2  . budesonide-formoterol (SYMBICORT) 160-4.5 MCG/ACT inhaler INHALE 2 PUFFS BY MOUTH TWICE A DAY RINSE MOUTH AFTER USE. (Patient taking differently: Inhale 2 puffs into the lungs in the morning and at bedtime. ) 10.2 g 5  . celecoxib (CELEBREX) 200 MG capsule Take 200 mg by mouth daily.    . cilostazol (PLETAL) 100 MG tablet Take 1 tablet (100 mg total) by mouth 2 (two) times daily. 60 tablet 5  . clopidogrel (PLAVIX) 75 MG tablet Take 1 tablet (75 mg total) by mouth daily. 30 tablet 11  . Dexlansoprazole (DEXILANT) 30 MG capsule Take 30- 60 min before your first and last meals of the day (Patient taking differently: Take 30 mg by mouth daily. ) 60 capsule 11  . diclofenac Sodium (VOLTAREN) 1 % GEL Apply 2 g topically at bedtime as needed (Pain).     Marland Kitchen docusate sodium (COLACE) 100 MG capsule Take 100 mg by mouth daily as needed for mild constipation or moderate constipation.     Marland Kitchen glipiZIDE (GLUCOTROL) 5 MG tablet Take 5 mg by mouth daily.     Marland Kitchen HYDROcodone-acetaminophen (NORCO) 7.5-325 MG per tablet Take 1 tablet by mouth 2 (two) times daily as needed for moderate pain.     Marland Kitchen ibuprofen (ADVIL) 200 MG tablet Take 400 mg by mouth in the morning and at bedtime.    . isosorbide mononitrate (IMDUR) 30 MG 24 hr tablet Take 1 tablet (30 mg total) by mouth daily. 30 tablet 6  . JANUVIA 100 MG tablet Take 100 mg by mouth daily.     Marland Kitchen ketoconazole (NIZORAL) 2 % cream Apply to both feet once daily for 4 weeks (Patient taking  differently: Apply 1 application topically daily as needed for irritation. ) 30 g 0  . losartan (COZAAR) 25 MG tablet Take 1 tablet (25 mg total) by mouth daily. 90 tablet 3  . metFORMIN (GLUCOPHAGE) 1000 MG tablet Take 500 mg by mouth 2 (two) times daily with a meal.     . nitroGLYCERIN (NITROSTAT) 0.4 MG SL tablet Place 1 tablet (0.4 mg total) under the tongue every 5 (five) minutes as needed for chest pain. 25 tablet 3  . ONE TOUCH ULTRA TEST test strip     . OneTouch Delica Lancets 78E  MISC Apply topically.    Marland Kitchen PROAIR HFA 108 (90 BASE) MCG/ACT inhaler Inhale 2 puffs into the lungs every 6 (six) hours as needed for wheezing or shortness of breath.     . Tiotropium Bromide Monohydrate (SPIRIVA RESPIMAT) 2.5 MCG/ACT AERS Inhale 2 puffs into the lungs daily. (Patient not taking: Reported on 10/11/2019) 4 g 11  . TURMERIC CURCUMIN PO Take 1,500 mg by mouth daily. (Patient not taking: Reported on 10/11/2019)    . vitamin C (ASCORBIC ACID) 500 MG tablet Take 500 mg by mouth daily. (Patient not taking: Reported on 10/11/2019)    . XELJANZ 5 MG TABS Take 1 tablet by mouth daily. (Patient taking differently: Take 5 mg by mouth at bedtime. ) 90 tablet 0   No current facility-administered medications for this visit.    Family History  Problem Relation Age of Onset  . Heart attack Mother 5  . Deep vein thrombosis Mother   . Diabetes Mother   . Hyperlipidemia Mother   . Hypertension Mother   . Peripheral vascular disease Mother   . Stroke Father   . Diabetes Father   . Hyperlipidemia Father   . Hypertension Father   . Peripheral vascular disease Father   . Diabetes Sister   . Hyperlipidemia Sister   . Hypertension Sister   . Diabetes Brother   . Hyperlipidemia Brother   . Cancer Brother   . Deep vein thrombosis Brother   . Hypertension Brother   . Peripheral vascular disease Brother   . Hyperlipidemia Son   . Cancer Son   . Hypertension Son   . Colon cancer Neg Hx     Social History     Socioeconomic History  . Marital status: Single    Spouse name: Not on file  . Number of children: Not on file  . Years of education: Not on file  . Highest education level: Not on file  Occupational History  . Not on file  Tobacco Use  . Smoking status: Former Smoker    Packs/day: 1.00    Years: 45.00    Pack years: 45.00    Types: Cigarettes, Cigars    Quit date: 09/01/2011    Years since quitting: 8.2  . Smokeless tobacco: Never Used  Vaping Use  . Vaping Use: Never used  Substance and Sexual Activity  . Alcohol use: No    Alcohol/week: 0.0 standard drinks  . Drug use: No  . Sexual activity: Not Currently    Birth control/protection: None  Other Topics Concern  . Not on file  Social History Narrative   The patient lives in [place].    Social Determinants of Health   Financial Resource Strain:   . Difficulty of Paying Living Expenses: Not on file  Food Insecurity:   . Worried About Charity fundraiser in the Last Year: Not on file  . Ran Out of Food in the Last Year: Not on file  Transportation Needs:   . Lack of Transportation (Medical): Not on file  . Lack of Transportation (Non-Medical): Not on file  Physical Activity:   . Days of Exercise per Week: Not on file  . Minutes of Exercise per Session: Not on file  Stress:   . Feeling of Stress : Not on file  Social Connections:   . Frequency of Communication with Friends and Family: Not on file  . Frequency of Social Gatherings with Friends and Family: Not on file  . Attends Religious Services: Not on  file  . Active Member of Clubs or Organizations: Not on file  . Attends Archivist Meetings: Not on file  . Marital Status: Not on file  Intimate Partner Violence:   . Fear of Current or Ex-Partner: Not on file  . Emotionally Abused: Not on file  . Physically Abused: Not on file  . Sexually Abused: Not on file     REVIEW OF SYSTEMS:  See HPI    PHYSICAL EXAMINATION:  Today's Vitals   12/08/19  0912  BP: 137/83  Pulse: 96  Temp: 98.2 F (36.8 C)  TempSrc: Skin  SpO2: 96%  Weight: 174 lb 1.6 oz (79 kg)  Height: 5\' 9"  (1.753 m)  PainSc: 3    Body mass index is 25.71 kg/m.   General:  WDWN in NAD; vital signs documented above Gait: Not observed HENT: WNL, normocephalic Pulmonary: normal non-labored breathing , without wheezing Cardiac: regular HR, without  Murmur; without carotid bruits Abdomen: soft, NT, no masses; aortic pulse is not palpable Skin: without rashes Vascular Exam/Pulses:  Right Left  Radial Unable to palpate Wrist brace in place  Ulnar 2+ (normal) Wrist brace in place  Femoral 2+ (normal) 2+ (normal)  Popliteal Unable to palpate Unable to palpate  AT Brisk biphasic  Monophasic   PT Brisk biphasic absent  Peroneal monophasic monophasic   Extremities: without ischemic changes, without Gangrene , without cellulitis; without open wounds;  Musculoskeletal: no muscle wasting or atrophy  Neurologic: A&O X 3;  No focal weakness or paresthesias are detected Psychiatric:  The pt has Normal affect.   Non-Invasive Vascular Imaging:   ABI's/TBI's on 12/08/2019: Right:  1.16/0 - Great toe pressure: 0 Left:  1.50/0.47 - Great toe pressure: 70   Previous ABI's/TBI's on 09/07/2019: Right:  0.94/0.34 - Great toe pressure: 51 Left:  1.24/0.47 - Great toe pressure:  72  Previous arteriogram on 08/04/2019: Findings:  Aortogram shows patent renal arteries bilaterally with a patent infrarenal aorta that is moderately diseased with no flow-limiting stenosis. The right common iliac arterystent is widely patent and we did a pullback pressure across this with no pressure difference. He does have some moderate disease in the left common iliac as well but has a bounding left common femoral pulse and this does not appear flow-limiting.  Right lower extremity arteriogram shows a patent common femoral profunda as well as a patent SFA. In the mid segment of SFA is  onefocal 50% stenosis that does not appear flow-limiting and the above and below-knee popliteal artery is widely patent. 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 The peroneal appears to have a proximal occlusion that isshort segmentand reconstitures. Dominant runoff isthe anterior tibial that does have about a 50% stenosis proximally and appears to occlude just above the ankle.Small vessel disease in the right foot.  Left lower extremity arteriogram shows a patent common femoral profunda as well as a patent common femoral to above-knee popliteal bypass that is widely patent.Below kneepopliteal arterypatent and he has runoff through the peroneal that has a proximal stenosis about 50%.    ASSESSMENT/PLAN:: 69 y.o. male here for follow up for PAD with hx of redo left femoral to popliteal bypass, hx of right iliac artery stenting and right leg claudication who presents today for follow up.  -pt continues to do well and is walking on the treadmill 20-30 minutes daily continuously unless on an incline.  He does not have any non healing wounds or rest pain or claudication with walking.  He does have some pain in the right shin, but this improves somewhat with walking and most likely not related to blood flow issues.    -he had brisk right PT/AT doppler signals and monophasic left AT/peroneal doppler signals.  His TBI on the right has decreased and is zero, but given he does not have any wounds or rest pain or claudication, will continue to monitor and he will continue with his walking program.  He does not smoke.  -pt will f/u in 3 months with ABI's and LLE arterial duplex. -he will contact us sooner if he has any new rest pain or non healing wounds.    Leontine Locket, [facility] [phone]  Clinic MD:   Scot Dock

## 2019-12-09 ENCOUNTER — Other Ambulatory Visit: Payer: Self-pay

## 2019-12-09 DIAGNOSIS — I779 Disorder of arteries and arterioles, unspecified: Secondary | ICD-10-CM

## 2019-12-17 DIAGNOSIS — E1122 Type 2 diabetes mellitus with diabetic chronic kidney disease: Secondary | ICD-10-CM | POA: Diagnosis not present

## 2019-12-17 DIAGNOSIS — Z299 Encounter for prophylactic measures, unspecified: Secondary | ICD-10-CM | POA: Diagnosis not present

## 2019-12-17 DIAGNOSIS — I10 Essential (primary) hypertension: Secondary | ICD-10-CM | POA: Diagnosis not present

## 2019-12-17 DIAGNOSIS — I739 Peripheral vascular disease, unspecified: Secondary | ICD-10-CM | POA: Diagnosis not present

## 2019-12-17 DIAGNOSIS — H6122 Impacted cerumen, left ear: Secondary | ICD-10-CM | POA: Diagnosis not present

## 2019-12-17 DIAGNOSIS — E1165 Type 2 diabetes mellitus with hyperglycemia: Secondary | ICD-10-CM | POA: Diagnosis not present

## 2019-12-20 ENCOUNTER — Telehealth: Payer: Self-pay | Admitting: Pharmacy Technician

## 2019-12-20 DIAGNOSIS — H9193 Unspecified hearing loss, bilateral: Secondary | ICD-10-CM | POA: Diagnosis not present

## 2019-12-20 DIAGNOSIS — I739 Peripheral vascular disease, unspecified: Secondary | ICD-10-CM | POA: Diagnosis not present

## 2019-12-20 DIAGNOSIS — Z299 Encounter for prophylactic measures, unspecified: Secondary | ICD-10-CM | POA: Diagnosis not present

## 2019-12-20 DIAGNOSIS — I25119 Atherosclerotic heart disease of native coronary artery with unspecified angina pectoris: Secondary | ICD-10-CM | POA: Diagnosis not present

## 2019-12-20 DIAGNOSIS — I10 Essential (primary) hypertension: Secondary | ICD-10-CM | POA: Diagnosis not present

## 2019-12-20 NOTE — Telephone Encounter (Signed)
Received notification from [facility] regarding a prior authorization for [facility]. Authorization has been APPROVED from 01/08/20 to 01/06/21.   Authorization #  [id number]

## 2019-12-27 ENCOUNTER — Telehealth: Payer: Self-pay | Admitting: Internal Medicine

## 2019-12-27 NOTE — Telephone Encounter (Signed)
Called and spoke with pt who states he has been having chest discomfort x2-3 months now. Asked pt if he had a cardiologist and he stated that he did but has not called them to discuss symptoms.  Pt said that he has not had to use any nitroglycerin. Pt said he is using the Symbicort and Spiriva inhalers as prescribed. Pt has not had to use the Proair inhaler.  Pt also said that he has had complaints of increased SOB and also has had a cough with yellow phlegm.  Stated to pt that we needed to get him scheduled for an appt and he verbalized understanding. Pt could not come in to the office this week 12/22 so stated to him next avail was 1/3. Pt verbalized understanding and has scheduled an appt 1/3. Nothing further needed.

## 2020-01-06 DIAGNOSIS — I10 Essential (primary) hypertension: Secondary | ICD-10-CM | POA: Diagnosis not present

## 2020-01-06 DIAGNOSIS — E119 Type 2 diabetes mellitus without complications: Secondary | ICD-10-CM | POA: Diagnosis not present

## 2020-01-06 DIAGNOSIS — E1165 Type 2 diabetes mellitus with hyperglycemia: Secondary | ICD-10-CM | POA: Diagnosis not present

## 2020-01-07 DIAGNOSIS — H5203 Hypermetropia, bilateral: Secondary | ICD-10-CM | POA: Diagnosis not present

## 2020-01-07 DIAGNOSIS — H2513 Age-related nuclear cataract, bilateral: Secondary | ICD-10-CM | POA: Diagnosis not present

## 2020-01-07 DIAGNOSIS — H52223 Regular astigmatism, bilateral: Secondary | ICD-10-CM | POA: Diagnosis not present

## 2020-01-07 DIAGNOSIS — Z7984 Long term (current) use of oral hypoglycemic drugs: Secondary | ICD-10-CM | POA: Diagnosis not present

## 2020-01-07 DIAGNOSIS — E119 Type 2 diabetes mellitus without complications: Secondary | ICD-10-CM | POA: Diagnosis not present

## 2020-01-10 ENCOUNTER — Encounter: Payer: Self-pay | Admitting: Primary Care

## 2020-01-10 ENCOUNTER — Ambulatory Visit (INDEPENDENT_AMBULATORY_CARE_PROVIDER_SITE_OTHER): Payer: Medicare Other

## 2020-01-10 ENCOUNTER — Other Ambulatory Visit: Payer: Self-pay

## 2020-01-10 ENCOUNTER — Ambulatory Visit (INDEPENDENT_AMBULATORY_CARE_PROVIDER_SITE_OTHER): Payer: Medicare Other | Admitting: Primary Care

## 2020-01-10 ENCOUNTER — Encounter: Payer: Self-pay | Admitting: *Deleted

## 2020-01-10 VITALS — BP 130/80 | HR 104 | Ht 69.0 in | Wt 173.8 lb

## 2020-01-10 DIAGNOSIS — J441 Chronic obstructive pulmonary disease with (acute) exacerbation: Secondary | ICD-10-CM

## 2020-01-10 DIAGNOSIS — J449 Chronic obstructive pulmonary disease, unspecified: Secondary | ICD-10-CM | POA: Diagnosis not present

## 2020-01-10 MED ORDER — PREDNISONE 10 MG PO TABS: ORAL_TABLET | ORAL | 0 refills | Status: DC

## 2020-01-10 MED ORDER — DOXYCYCLINE HYCLATE 100 MG PO TABS: 100.0000 mg | ORAL_TABLET | Freq: Two times a day (BID) | ORAL | 0 refills | Status: DC

## 2020-01-10 NOTE — Progress Notes (Signed)
CXR was normal, lungs clear

## 2020-01-10 NOTE — Patient Instructions (Addendum)
Recommendations Continue Symbicort 2 puffs twice daily morning and evening (rinse mouth after use) Continue Spiriva Respimat 2 puffs once daily  Rx Doxycycline 1 tablet twice daily x7 days (avoid direct sunlight while taking) Prednisone taper as directed  Orders: CXR today re: COPD exacerbation   Follow-up [place] in 1 week with Beth NP, if chest discomfort worsens please contact cardiologist or present to ED

## 2020-01-10 NOTE — Progress Notes (Signed)
@Patient  ID: [name], male    DOB: 06/18/1950, 70 y.o.   MRN: [id number]  Chief Complaint  Patient presents with  . Follow-up    Pt states he has been having complaints of wheezing as well as a cough with yellow phlegm. Pt also states he has had some increased SOB.    Referring provider: [name], MD  HPI: 70 year old male, former smoker quit in 2013 (45-pack-year history).  Past medical history significant for COPD Gold 2.  [name] seen in office on 02/09/2019. Maintained on Symbicort 160 and Spiriva respimat.   01/10/2020 Patient presents today for annual follow-up for COPD.  Patient called our office on 12/27/2019 with reports of chest discomfort for 2 to 3 months with associated increased shortness of breath, wheezing and cough with yellow phlegm.   He has been helping son outside and exposed to dust/leaves. His chest discomfort is described as left sided chest tightness which occurs after he exerts himself. This goes away with rest the next morning. He last experienced this 2-3 weeks ago. At times he has been coughing up more yellow mucus. He is mostly compliant with Symbicort twice a day, he occasionally forgerts to use Spiriva. He has not had to use albuterol rescue inhaler or nitroglycerin. He also complains of right ear being blocked, seeing ENT specalist next week.    Pulmonary function testing: - PFT's 01/04/15  FEV1 2.15 (71 % ) ratio 58  p 14 % improvement from saba p ? prior to study with DLCO  70 % corrects to 94  % for alv volume  With only mild curvature f/v loop  - Spirometry 01/28/2017  FEV1 1.59 (53%)  Ratio 59 off all rx  - PFT's  04/04/2017  FEV1 1.99 (68 % ) ratio 62  p 23 % improvement from saba p nothing prior to study with DLCO  63 % corrects to 82  % for alv volume  - Spirometry 11/04/2017  FEV1 1.4 (47%)  Ratio 58 off all rx       No Known Allergies  Immunization History  Administered Date(s) Administered  . Influenza, High Dose Seasonal PF  10/14/2016, 09/21/2018  . Influenza-Unspecified 11/08/2008, 12/10/2019  . Moderna Sars-Covid-2 Vaccination 02/19/2019, 03/22/2019, 11/23/2019  . PPD Test 09/22/2007  . Tdap 02/09/2001, 08/05/2018    Past Medical History:  Diagnosis Date  . Arthritis    RA  . Bronchitis   . Carotid artery disease ([place])   . Cervical spine disease    Neck surgery in 2007 with Dr. Joya Salm  . COPD (chronic obstructive pulmonary disease) ([place])   . Coronary atherosclerosis of native coronary artery    Nonobstrtuctive by catheterization 2007, 2013, 2021  . DVT (deep venous thrombosis) ([place])   . Essential hypertension   . GERD (gastroesophageal reflux disease)   . Hyperlipidemia   . PAD (peripheral artery disease) ([place])    s/p left fem-pop bypass 2007; PTA and stenting of right iliac artery 09/2011  . Rheumatoid arthritis(714.0)   . Type 2 diabetes mellitus (HCC)     Tobacco History: Social History   Tobacco Use  Smoking Status Former Smoker  . Packs/day: 1.00  . Years: 45.00  . Pack years: 45.00  . Types: Cigarettes, Cigars  . Quit date: 09/01/2011  . Years since quitting: 8.3  Smokeless Tobacco Never Used   Counseling given: Not Answered   Outpatient Medications Prior to Visit  Medication Sig Dispense Refill  . aspirin EC 81 MG  tablet Take 1 tablet (81 mg total) by mouth daily.    . budesonide-formoterol (SYMBICORT) 160-4.5 MCG/ACT inhaler INHALE 2 PUFFS BY MOUTH TWICE A DAY RINSE MOUTH AFTER USE. (Patient taking differently: Inhale 2 puffs into the lungs in the morning and at bedtime.) 10.2 g 5  . cilostazol (PLETAL) 100 MG tablet Take 1 tablet (100 mg total) by mouth 2 (two) times daily. 60 tablet 5  . clopidogrel (PLAVIX) 75 MG tablet Take 1 tablet (75 mg total) by mouth daily. 30 tablet 11  . Dexlansoprazole (DEXILANT) 30 MG capsule Take 30- 60 min before your first and last meals of the day (Patient taking differently: Take 30 mg by mouth daily.) 60 capsule 11  . diclofenac Sodium  (VOLTAREN) 1 % GEL Apply 2 g topically at bedtime as needed (Pain).     Marland Kitchen docusate sodium (COLACE) 100 MG capsule Take 100 mg by mouth daily as needed for mild constipation or moderate constipation.     Marland Kitchen glipiZIDE (GLUCOTROL) 5 MG tablet Take 5 mg by mouth daily.     Marland Kitchen HYDROcodone-acetaminophen (NORCO) 7.5-325 MG per tablet Take 1 tablet by mouth 2 (two) times daily as needed for moderate pain.    Marland Kitchen ibuprofen (ADVIL) 200 MG tablet Take 400 mg by mouth in the morning and at bedtime.    Marland Kitchen JANUVIA 100 MG tablet Take 100 mg by mouth daily.    Marland Kitchen losartan (COZAAR) 50 MG tablet Take 50 mg by mouth daily.    . metFORMIN (GLUCOPHAGE) 1000 MG tablet Take 500 mg by mouth 2 (two) times daily with a meal.     . ONE TOUCH ULTRA TEST test strip     . OneTouch Delica Lancets 33G MISC Apply topically.    Marland Kitchen PROAIR HFA 108 (90 BASE) MCG/ACT inhaler Inhale 2 puffs into the lungs every 6 (six) hours as needed for wheezing or shortness of breath.     . Tiotropium Bromide Monohydrate (SPIRIVA RESPIMAT) 2.5 MCG/ACT AERS Inhale 2 puffs into the lungs daily. 4 g 11  . TURMERIC CURCUMIN PO Take 1,500 mg by mouth daily.    . vitamin C (ASCORBIC ACID) 500 MG tablet Take 500 mg by mouth daily.    [name] 5 MG TABS Take 1 tablet by mouth daily. (Patient taking differently: Take 5 mg by mouth at bedtime.) 90 tablet 0  . atorvastatin (LIPITOR) 40 MG tablet Take 1 tablet (40 mg total) by mouth daily. 90 tablet 2  . isosorbide mononitrate (IMDUR) 30 MG 24 hr tablet Take 1 tablet (30 mg total) by mouth daily. 30 tablet 6  . losartan (COZAAR) 25 MG tablet Take 1 tablet (25 mg total) by mouth daily. 90 tablet 3  . nitroGLYCERIN (NITROSTAT) 0.4 MG SL tablet Place 1 tablet (0.4 mg total) under the tongue every 5 (five) minutes as needed for chest pain. (Patient not taking: Reported on 01/10/2020) 25 tablet 3  . celecoxib (CELEBREX) 200 MG capsule Take 200 mg by mouth daily.    Marland Kitchen ketoconazole (NIZORAL) 2 % cream Apply to both feet once  daily for 4 weeks (Patient taking differently: No sig reported) 30 g 0   No facility-administered medications prior to visit.   Review of Systems  Review of Systems  Constitutional: Negative for chills, diaphoresis and fever.  HENT: Positive for ear discharge and ear pain.   Respiratory: Positive for cough, chest tightness and shortness of breath.   Cardiovascular: Negative.     Physical Exam  BP  130/80 (BP Location: Left Arm, Cuff Size: Normal)   Pulse (!) 104   Ht 5\' 9"  (1.753 m)   Wt 173 lb 12.8 oz (78.8 kg)   SpO2 98%   BMI 25.67 kg/m  Physical Exam Constitutional:      Appearance: Normal appearance.  HENT:     Head: Normocephalic and atraumatic.     Left Ear: Tympanic membrane normal.     Ears:     Comments: Right ear canal with dried blood, unable to visualized TM    Mouth/Throat:     Comments: Deferred d/t masking Cardiovascular:     Rate and Rhythm: Regular rhythm. Tachycardia present.  Pulmonary:     Effort: Pulmonary effort is normal.     Breath sounds: Normal breath sounds. No wheezing or rales.  Musculoskeletal:        General: Normal range of motion.  Skin:    General: Skin is warm and dry.  Neurological:     General: No focal deficit present.     Mental Status: He is alert and oriented to person, place, and time. Mental status is at baseline.  Psychiatric:        Mood and Affect: Mood normal.        Behavior: Behavior normal.        Thought Content: Thought content normal.        Judgment: Judgment normal.      Lab Results:  CBC    Component Value Date/Time   WBC 6.4 10/19/2019 0946   WBC 5.8 04/13/2019 1109   RBC 4.19 10/19/2019 0946   RBC 4.26 04/13/2019 1109   HGB 12.2 (L) 10/19/2019 0946   HCT 37.6 10/19/2019 0946   PLT 308 10/19/2019 0946   MCV 90 10/19/2019 0946   MCH 29.1 10/19/2019 0946   MCH 28.9 04/13/2019 1109   MCHC 32.4 10/19/2019 0946   MCHC 31.4 04/13/2019 1109   RDW 14.1 10/19/2019 0946   LYMPHSABS 1.7 10/19/2019 0946    MONOABS 0.3 05/20/2018 1041   EOSABS 0.1 10/19/2019 0946   BASOSABS 0.1 10/19/2019 0946    BMET    Component Value Date/Time   NA 141 10/19/2019 0946   K 4.5 10/19/2019 0946   CL 103 10/19/2019 0946   CO2 24 10/19/2019 0946   GLUCOSE 186 (H) 10/19/2019 0946   GLUCOSE 197 (H) 08/04/2019 0806   BUN 19 10/19/2019 0946   CREATININE 0.99 10/19/2019 0946   CREATININE 0.98 08/06/2018 1035   CALCIUM 9.3 10/19/2019 0946   GFRNONAA 78 10/19/2019 0946   GFRNONAA 79 08/06/2018 1035   GFRAA 90 10/19/2019 0946   GFRAA 92 08/06/2018 1035    BNP No results found for: BNP  ProBNP    Component Value Date/Time   PROBNP 61.0 05/20/2018 1041    Imaging: DG Chest 2 View  Result Date: 01/10/2020 CLINICAL DATA:  COPD exacerbation. EXAM: CHEST - 2 VIEW COMPARISON:  March 17, 2019. FINDINGS: The heart size and mediastinal contours are within normal limits. Both lungs are clear. No pneumothorax or pleural effusion is noted. The visualized skeletal structures are unremarkable. IMPRESSION: No active cardiopulmonary disease. Electronically Signed   By: March 19, 2019 M.D.   On: 01/10/2020 12:20     Assessment & Plan:   COPD with acute exacerbation (HCC) - Increased shortness of breath, wheezing and cough over several weeks, likely exacerbated by yard work/dust exposure. CXR today showed no acute process. Chest discomfort appears to be more consistent with underlying AECOPD,  he does not currently have active CP and has not had symptoms in several weeks. Treating exacerbation with course of oral doxycycline and prednisone taper.  Reinforced importance of using inhalers regularly. Continue Symbicort 160 two puffs twice daily morning and evening and Spiriva Respimat 2 puffs once daily. FU televisit in 1 week. If chest tightness does not improve recommend patient follow-up with cardiology.     Martyn Ehrich, NP 01/10/2020

## 2020-01-10 NOTE — Assessment & Plan Note (Addendum)
-   Increased shortness of breath, wheezing and cough over several weeks, likely exacerbated by yard work/dust exposure. CXR today showed no acute process. Chest discomfort appears to be more consistent with underlying AECOPD, he does not currently have active CP and has not had symptoms in several weeks. Treating exacerbation with course of oral doxycycline and prednisone taper.  Reinforced importance of using inhalers regularly. Continue Symbicort 160 two puffs twice daily morning and evening and Spiriva Respimat 2 puffs once daily. FU televisit in 1 week. If chest tightness does not improve recommend patient follow-up with cardiology.

## 2020-01-12 DIAGNOSIS — E1122 Type 2 diabetes mellitus with diabetic chronic kidney disease: Secondary | ICD-10-CM | POA: Diagnosis not present

## 2020-01-12 DIAGNOSIS — E1165 Type 2 diabetes mellitus with hyperglycemia: Secondary | ICD-10-CM | POA: Diagnosis not present

## 2020-01-12 DIAGNOSIS — I10 Essential (primary) hypertension: Secondary | ICD-10-CM | POA: Diagnosis not present

## 2020-01-12 DIAGNOSIS — E1142 Type 2 diabetes mellitus with diabetic polyneuropathy: Secondary | ICD-10-CM | POA: Diagnosis not present

## 2020-01-12 DIAGNOSIS — Z299 Encounter for prophylactic measures, unspecified: Secondary | ICD-10-CM | POA: Diagnosis not present

## 2020-01-12 DIAGNOSIS — Z6824 Body mass index (BMI) 24.0-24.9, adult: Secondary | ICD-10-CM | POA: Diagnosis not present

## 2020-01-12 DIAGNOSIS — Z87891 Personal history of nicotine dependence: Secondary | ICD-10-CM | POA: Diagnosis not present

## 2020-01-12 DIAGNOSIS — N183 Chronic kidney disease, stage 3 unspecified: Secondary | ICD-10-CM | POA: Diagnosis not present

## 2020-01-14 NOTE — Progress Notes (Deleted)
Office Visit Note  Patient: [name]             Date of Birth: [birth year]           MRN: [id number]             PCP: [name], MD Referring: [name], MD Visit Date: 01/28/2020 Occupation: @GUAROCC @  Subjective:  No chief complaint on file.   History of Present Illness: [name] is a 70 y.o. male ***   Activities of Daily Living:  Patient reports morning stiffness for *** {minute/hour:19697}.   Patient {ACTIONS;DENIES/REPORTS:21021675::"Denies"} nocturnal pain.  Difficulty dressing/grooming: {ACTIONS;DENIES/REPORTS:21021675::"Denies"} Difficulty climbing stairs: {ACTIONS;DENIES/REPORTS:21021675::"Denies"} Difficulty getting out of chair: {ACTIONS;DENIES/REPORTS:21021675::"Denies"} Difficulty using hands for taps, buttons, cutlery, and/or writing: {ACTIONS;DENIES/REPORTS:21021675::"Denies"}  No Rheumatology ROS completed.   PMFS History:  Patient Active Problem List   Diagnosis Date Noted  . Puncture wound of finger of left hand 08/08/2018  . Puncture wound of finger, right, complicated, initial encounter 08/05/2018  . DOE (dyspnea on exertion) 11/04/2017  . Muscle cramps 07/04/2017  . Contracture of left elbow 07/04/2017  . High risk medication use 06/23/2017  . Primary osteoarthritis of both hands 06/23/2017  . Primary osteoarthritis of both feet 06/23/2017  . 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 factor ([place]) 06/23/2017  . DDD (degenerative disc disease), cervical 06/23/2017  . Rotator cuff syndrome of right shoulder 06/06/2017  . History of  bilateral carpal tunnel release 06/06/2017  . Ganglion cyst of wrist, left 06/06/2017  . Non-insulin dependent type 2 diabetes mellitus ([place]) 06/06/2017  . Chest pain with high risk for cardiac etiology 04/05/2017  . COPD with acute exacerbation ([place]) 03/13/2017  . Plantar fasciitis 02/21/2017  . Iliac artery stenosis, right ([place]) 02/12/2017  . Hematochezia 02/10/2017  . Abnormal  CT of the abdomen 02/10/2017  . Cervical spondylosis 10/11/2016  . Bony growth 10/13/2015  . Pain in joint, lower leg 03/02/2014  . Discoloration of skin-Left dorsum foot 09/28/2013  . Swelling of limb-Left Calf / Leg 09/28/2013  . PVD (peripheral vascular disease) ([place]) 10/14/2012  . Pain in limb- Left popliteal and calf 10/14/2012  . Dyslipidemia, goal LDL below 70   . Essential hypertension 03/05/2012  . 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 Generalized osteoarthritis of multiple sites 02/06/2011  . History of arterial bypass of lower limb 12/25/2010  . Atherosclerosis of native arteries of extremity with intermittent claudication ([place]) 12/25/2010  . Coronary artery calcification seen on CT scan 04/16/2008  . COPD  GOLD II 04/16/2008  . GASTROESOPHAGEAL REFLUX DISEASE 04/16/2008  . Rheumatoid arthritis ([place]) 04/16/2008  . DIABETES MELLITUS, BORDERLINE 04/16/2008    Past Medical History:  Diagnosis Date  . Arthritis    RA  . Bronchitis   . Carotid artery disease ([place])   . Cervical spine disease    Neck surgery in 2007 with Dr. Joya Salm  . COPD (chronic obstructive pulmonary disease) ([place])   . Coronary atherosclerosis of native coronary artery    Nonobstrtuctive by catheterization 2007, 2013, 2021  . DVT (deep venous thrombosis) ([place])   . Essential hypertension   . GERD (gastroesophageal reflux disease)   . Hyperlipidemia   . PAD (peripheral artery disease) ([place])    s/p left fem-pop bypass 2007; PTA and stenting of right iliac artery 09/2011  . Rheumatoid arthritis(714.0)   . Type 2 diabetes mellitus (HCC)     Family History  Problem Relation Age of Onset  . Heart attack Mother 15  . Deep  vein thrombosis Mother   . Diabetes Mother   . Hyperlipidemia Mother   . Hypertension Mother   . Peripheral vascular disease Mother   . Stroke Father   . Diabetes Father   . Hyperlipidemia Father   . Hypertension Father   . Peripheral vascular disease Father   .  Diabetes Sister   . Hyperlipidemia Sister   . Hypertension Sister   . Diabetes Brother   . Hyperlipidemia Brother   . Cancer Brother   . Deep vein thrombosis Brother   . Hypertension Brother   . Peripheral vascular disease Brother   . Hyperlipidemia Son   . Cancer Son   . Hypertension Son   . Colon cancer Neg Hx    Past Surgical History:  Procedure Laterality Date  . ABDOMINAL AORTAGRAM N/A 10/03/2011   Procedure: ABDOMINAL Maxcine Ham;  Surgeon: Conrad [place], MD;  Location[facility] CATH LAB;  Service: Cardiovascular;  Laterality: N/A;  . ABDOMINAL AORTOGRAM W/LOWER EXTREMITY N/A 01/16/2017   Procedure: ABDOMINAL AORTOGRAM W/LOWER EXTREMITY;  Surgeon: Conrad [place], MD;  Location: [facility] CV LAB;  Service: Cardiovascular;  Laterality: N/A;  . ABDOMINAL AORTOGRAM W/LOWER EXTREMITY Bilateral 08/04/2019   Procedure: ABDOMINAL AORTOGRAM W/LOWER EXTREMITY;  Surgeon: Marty Heck, MD;  Location: [facility];  Service: Cardiovascular;  Laterality: Bilateral;  . CERVICAL DISC SURGERY    . COLONOSCOPY WITH PROPOFOL N/A 02/17/2017   Procedure: COLONOSCOPY WITH PROPOFOL;  Surgeon: Daneil Dolin, MD;  Location: [facility] SUITE;  Service: Endoscopy;  Laterality: N/A;  2:45pm  . FEMORAL-POPLITEAL BYPASS GRAFT  11/06/2005  . ILIAC ARTERY STENT  10-03-11   Right CIA stenting  . LEFT HEART CATH AND CORONARY ANGIOGRAPHY N/A 04/14/2019   Procedure: LEFT HEART CATH AND CORONARY ANGIOGRAPHY;  Surgeon: Troy Sine, MD;  Location: [facility] CV LAB;  Service: Cardiovascular;  Laterality: N/A;  . PERIPHERAL VASCULAR BALLOON ANGIOPLASTY Right 01/16/2017   Procedure: PERIPHERAL VASCULAR BALLOON ANGIOPLASTY;  Surgeon: Conrad [place], MD;  Location: [facility] LAB;  Service: Cardiovascular;  Laterality: Right;  common iliac   . PR VEIN BYPASS GRAFT,AORTO-FEM-POP  06-22-2010   Redo Left Fem-Pop  . Repair of left arm fracture    . 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 SURGERY SCROTAL / TESTICULAR      Social History   Social History Narrative   The patient lives in [place].    Immunization History  Administered Date(s) Administered  . Influenza, High Dose Seasonal PF 10/14/2016, 09/21/2018  . Influenza-Unspecified 11/08/2008, 12/10/2019  . Moderna Sars-Covid-2 Vaccination 02/19/2019, 03/22/2019, 11/23/2019  . PPD Test 09/22/2007  . Tdap 02/09/2001, 08/05/2018     Objective: Vital Signs: There were no vitals taken for this visit.   Physical Exam   Musculoskeletal Exam: ***  CDAI Exam: CDAI Score: -- Patient Global: --; Provider Global: -- Swollen: --; Tender: -- Joint Exam 01/28/2020   No joint exam has been documented for this visit   There is currently no information documented on the homunculus. Go to the Rheumatology activity and complete the homunculus joint exam.  Investigation: No additional findings.  Imaging: DG Chest 2 View  Result Date: 01/10/2020 CLINICAL DATA:  COPD exacerbation. EXAM: CHEST - 2 VIEW COMPARISON:  March 17, 2019. FINDINGS: The heart size and mediastinal contours are within normal limits. Both lungs are clear. No pneumothorax or pleural effusion is noted. The visualized skeletal structures are unremarkable. IMPRESSION: No active cardiopulmonary disease. Electronically Signed   By:  Marijo Conception M.D.   On: 01/10/2020 12:20    Recent Labs: Lab Results  Component Value Date   WBC 6.4 10/19/2019   HGB 12.2 (L) 10/19/2019   PLT 308 10/19/2019   NA 141 10/19/2019   K 4.5 10/19/2019   CL 103 10/19/2019   CO2 24 10/19/2019   GLUCOSE 186 (H) 10/19/2019   BUN 19 10/19/2019   CREATININE 0.99 10/19/2019   BILITOT 0.2 10/19/2019   ALKPHOS 79 10/19/2019   AST 18 10/19/2019   ALT 16 10/19/2019   PROT 6.7 10/19/2019   ALBUMIN 4.4 10/19/2019   CALCIUM 9.3 10/19/2019   GFRAA 90 10/19/2019   QFTBGOLDPLUS Negative 10/19/2019    Speciality Comments: Prior therapy includes: methotrexate ( d/c alcohol use), Arava (d/c patient preference),  [place] (patient declined)  Procedures:  No procedures performed Allergies: Patient has no known allergies.   Assessment / Plan:     Visit Diagnoses: 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 factor (HCC)  High risk medication use  Contracture of left elbow  Primary osteoarthritis of both hands  Primary osteoarthritis of both feet  DDD (degenerative disc disease), cervical  Rotator cuff syndrome of right shoulder  History of  bilateral carpal tunnel release  Ganglion cyst of wrist, left  Mixed hyperlipidemia  PVD (peripheral vascular disease) (HCC)  Atherosclerosis of native coronary artery of native heart without angina pectoris  COPD  GOLD II  Essential hypertension  History of diabetes mellitus  Orders: No orders of the defined types were placed in this encounter.  No orders of the defined types were placed in this encounter.   Face-to-face time spent with patient was *** minutes. Greater than 50% of time was spent in counseling and coordination of care.  Follow-Up Instructions: No follow-ups on file.   Ofilia Neas, PA-C  Note - This record has been created using Dragon software.  Chart creation errors have been sought, but may not always  have been located. Such creation errors do not reflect on  the standard of medical care.

## 2020-01-17 ENCOUNTER — Ambulatory Visit: Payer: Medicare Other | Admitting: Primary Care

## 2020-01-17 ENCOUNTER — Other Ambulatory Visit: Payer: Self-pay

## 2020-01-17 DIAGNOSIS — H903 Sensorineural hearing loss, bilateral: Secondary | ICD-10-CM | POA: Diagnosis not present

## 2020-01-17 DIAGNOSIS — H6123 Impacted cerumen, bilateral: Secondary | ICD-10-CM | POA: Diagnosis not present

## 2020-01-26 NOTE — Progress Notes (Signed)
Office Visit Note  Patient: [name]             Date of Birth: 1950-10-25           MRN: [id number]             PCP: [name], MD Referring: [name], MD Visit Date: 01/27/2020 Occupation: @GUAROCC @  Subjective:  Left wrist pain   History of Present Illness: [name] is a 70 y.o. male with history of seronegative rheumatoid arthritis and osteoarthritis.  He is prescribed xeljanz 5 mg 1 tablet by mouth daily. He has not been taking xeljanz on a regular basis.  He has started using a pillbox but tends to still miss doses on a weekly basis.  He has been apprehensive of side effects so he has been taking it on a PRN basis.  He presents today with pain and intermittent swelling in his left wrist joint.  He has been wearing a compression brace daily which alleviates some of his discomfort.  He has some weakness in the left wrist.  He continues to have pain in both shoulder joints.   He was recently started on a course of doxycycline and prednisone by Dr. Melvyn Novas. He states his blood glucose was in the 400s while on prednisone so he discontinued.  He has completed doxycycline and has no symptoms of an infection.      Activities of Daily Living:  Patient reports morning stiffness for 5-6 minutes.   Patient Reports nocturnal pain.  Difficulty dressing/grooming: Denies Difficulty climbing stairs: Reports Difficulty getting out of chair: Denies Difficulty using hands for taps, buttons, cutlery, and/or writing: Reports  Review of Systems  Constitutional: Negative for fatigue.  HENT: Negative for mouth sores, mouth dryness and nose dryness.   Eyes: Negative for pain, itching and dryness.  Respiratory: Positive for shortness of breath. Negative for difficulty breathing.   Cardiovascular: Negative for chest pain and palpitations.  Gastrointestinal: Positive for constipation. Negative for blood in stool and diarrhea.  Endocrine: Positive for increased urination.  Genitourinary:  Positive for urgency.  Musculoskeletal: Positive for arthralgias, joint pain, myalgias, muscle weakness, morning stiffness, muscle tenderness and myalgias. Negative for joint swelling.  Skin: Negative for color change, rash and redness.  Allergic/Immunologic: Negative for susceptible to infections.  Neurological: Positive for numbness. Negative for dizziness, headaches, memory loss and weakness.  Hematological: Positive for bruising/bleeding tendency.  Psychiatric/Behavioral: Positive for sleep disturbance. Negative for confusion.    PMFS History:  Patient Active Problem List   Diagnosis Date Noted  . Puncture wound of finger of left hand 08/08/2018  . Puncture wound of finger, right, complicated, initial encounter 08/05/2018  . DOE (dyspnea on exertion) 11/04/2017  . Muscle cramps 07/04/2017  . Contracture of left elbow 07/04/2017  . High risk medication use 06/23/2017  . Primary osteoarthritis of both hands 06/23/2017  . Primary osteoarthritis of both feet 06/23/2017  . 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 factor ([place]) 06/23/2017  . DDD (degenerative disc disease), cervical 06/23/2017  . Rotator cuff syndrome of right shoulder 06/06/2017  . History of  bilateral carpal tunnel release 06/06/2017  . Ganglion cyst of wrist, left 06/06/2017  . Non-insulin dependent type 2 diabetes mellitus ([place]) 06/06/2017  . Chest pain with high risk for cardiac etiology 04/05/2017  . COPD with acute exacerbation ([place]) 03/13/2017  . Plantar fasciitis 02/21/2017  . Iliac artery stenosis, right ([place]) 02/12/2017  . Hematochezia 02/10/2017  . Abnormal CT of the  abdomen 02/10/2017  . Cervical spondylosis 10/11/2016  . Bony growth 10/13/2015  . Pain in joint, lower leg 03/02/2014  . Discoloration of skin-Left dorsum foot 09/28/2013  . Swelling of limb-Left Calf / Leg 09/28/2013  . PVD (peripheral vascular disease) ([place]) 10/14/2012  . Pain in limb- Left popliteal and calf  10/14/2012  . Dyslipidemia, goal LDL below 70   . Essential hypertension 03/05/2012  . 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 Generalized osteoarthritis of multiple sites 02/06/2011  . History of arterial bypass of lower limb 12/25/2010  . Atherosclerosis of native arteries of extremity with intermittent claudication ([place]) 12/25/2010  . Coronary artery calcification seen on CT scan 04/16/2008  . COPD  GOLD II 04/16/2008  . GASTROESOPHAGEAL REFLUX DISEASE 04/16/2008  . Rheumatoid arthritis ([place]) 04/16/2008  . DIABETES MELLITUS, BORDERLINE 04/16/2008    Past Medical History:  Diagnosis Date  . Arthritis    RA  . Bronchitis   . Carotid artery disease ([place])   . Cervical spine disease    Neck surgery in 2007 with Dr. Joya Salm  . COPD (chronic obstructive pulmonary disease) ([place])   . Coronary atherosclerosis of native coronary artery    Nonobstrtuctive by catheterization 2007, 2013, 2021  . DVT (deep venous thrombosis) ([place])   . Essential hypertension   . GERD (gastroesophageal reflux disease)   . Hyperlipidemia   . PAD (peripheral artery disease) ([place])    s/p left fem-pop bypass 2007; PTA and stenting of right iliac artery 09/2011  . Rheumatoid arthritis(714.0)   . Type 2 diabetes mellitus ([facility])     Family History  Problem Relation Age of Onset  . Heart attack Mother 2  . Deep vein thrombosis Mother   . Diabetes Mother   . Hyperlipidemia Mother   . Hypertension Mother   . Peripheral vascular disease Mother   . Stroke Father   . Diabetes Father   . Hyperlipidemia Father   . Hypertension Father   . Peripheral vascular disease Father   . Diabetes Sister   . Hyperlipidemia Sister   . Hypertension Sister   . Diabetes Brother   . Hyperlipidemia Brother   . Cancer Brother   . Deep vein thrombosis Brother   . Hypertension Brother   . Peripheral vascular disease Brother   . Hyperlipidemia Son   . Cancer Son   . Hypertension Son   . Colon cancer Neg Hx    Past  Surgical History:  Procedure Laterality Date  . ABDOMINAL AORTAGRAM N/A 10/03/2011   Procedure: ABDOMINAL Maxcine Ham;  Surgeon: Conrad Hawthorne, MD;  Location: [facility] C[facility] LAB;  Service: Cardiovascular;  Laterality: N/A;  . ABDOMINAL AORTOGRAM W/LOWER EXTREMITY N/A 01/16/2017   Procedure: ABDOMINAL AORTOGRAM W/LOWER EXTREMITY;  Surgeon: Conrad Coleman, MD;  Location: [place] CV LAB;  Service: Cardiovascular;  Laterality: N/A;  . ABDOMINAL AORTOGRAM W/LOWER EXTREMITY Bilateral 08/04/2019   Procedure: ABDOMINAL AORTOGRAM W/LOWER EXTREMITY;  Surgeon: Marty Heck, MD;  Location: [place] CV LAB;  Service: Cardiovascular;  Laterality: Bilateral;  . CERVICAL DISC SURGERY    . COLONOSCOPY WITH PROPOFOL N/A 02/17/2017   Procedure: COLONOSCOPY WITH PROPOFOL;  Surgeon: Daneil Dolin, MD;  Location[facility];  Service: Endoscopy;  Laterality: N/A;  2:45pm  . FEMORAL-POPLITEAL BYPASS GRAFT  11/06/2005  . ILIAC ARTERY STENT  10-03-11   Right CIA stenting  . LEFT HEART CATH AND CORONARY ANGIOGRAPHY N/A 04/14/2019   Procedure: LEFT HEART CATH AND CORONARY ANGIOGRAPHY;  Surgeon: Troy Sine, MD;  Location: [place] CV LAB;  Service: Cardiovascular;  Laterality: N/A;  . PERIPHERAL VASCULAR BALLOON ANGIOPLASTY Right 01/16/2017   Procedure: PERIPHERAL VASCULAR BALLOON ANGIOPLASTY;  Surgeon: Conrad Gentry, MD;  Location: [place] CV LAB;  Service: Cardiovascular;  Laterality: Right;  common iliac   . PR VEIN BYPASS GRAFT,AORTO-FEM-POP  06-22-2010   Redo Left Fem-Pop  . Repair of left arm fracture    . 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 SURGERY SCROTAL / TESTICULAR     Social History   Social History Narrative   The patient lives in [place].    Immunization History  Administered Date(s) Administered  . Influenza, High Dose Seasonal PF 10/14/2016, 09/21/2018  . Influenza-Unspecified 11/08/2008, 12/10/2019  . Moderna Sars-Covid-2 Vaccination 02/19/2019, 03/22/2019, 11/23/2019  . PPD Test  09/22/2007  . Tdap 02/09/2001, 08/05/2018     Objective: Vital Signs: BP 117/85 (BP Location: Left Arm, Patient Position: Sitting, Cuff Size: Normal)   Pulse (!) 116   Resp 16   Ht 5\' 10"  (1.778 m)   Wt 170 lb (77.1 kg)   BMI 24.39 kg/m    Physical Exam Vitals and nursing note reviewed.  Constitutional:      Appearance: He is well-developed and well-nourished.  HENT:     Head: Normocephalic and atraumatic.  Eyes:     Extraocular Movements: EOM normal.     Conjunctiva/sclera: Conjunctivae normal.     Pupils: Pupils are equal, round, and reactive to light.  Pulmonary:     Effort: Pulmonary effort is normal.  Abdominal:     Palpations: Abdomen is soft.  Musculoskeletal:     Cervical back: Normal range of motion and neck supple.  Skin:    General: Skin is warm and dry.     Capillary Refill: Capillary refill takes less than 2 seconds.  Neurological:     Mental Status: He is alert and oriented to person, place, and time.  Psychiatric:        Mood and Affect: Mood and affect normal.        Behavior: Behavior normal.      Musculoskeletal Exam: C-spine, thoracic spine, and lumbar spine good ROM.  Painful ROM of both shoulders.  Right elbow has good ROM with no tenderness or inflammation.  Left elbow joint contracture.  Limited and painful ROM of left wrist joint.  Tenderness on the ulnar aspect of the left wrist.  Right wrist has good ROM with no tenderness or inflammation.  Synovial thickening of MCPs and PIPs but no synovitis noted.  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  Hip joints limited ROM bilaterally.  Knee joints good ROM with no warmth or effusion.  Ankle joints good ROM with no tenderness or swelling.   CDAI Exam: CDAI Score: 4.5  Patient Global: 9 mm; Provider Global: 6 mm Swollen: 0 ; Tender: 3  Joint Exam 01/27/2020      Right  Left  Glenohumeral   Tender   Tender  Wrist      Tender     Investigation: No additional findings.  Imaging: DG Chest 2  View  Result Date: 01/10/2020 CLINICAL DATA:  COPD exacerbation. EXAM: CHEST - 2 VIEW COMPARISON:  March 17, 2019. FINDINGS: The heart size and mediastinal contours are within normal limits. Both lungs are clear. No pneumothorax or pleural effusion is noted. The visualized skeletal structures are unremarkable. IMPRESSION: No active cardiopulmonary disease. Electronically Signed   By: Marijo Conception M.D.   On: 01/10/2020 12:20  Recent Labs: Lab Results  Component Value Date   WBC 6.4 10/19/2019   HGB 12.2 (L) 10/19/2019   PLT 308 10/19/2019   NA 141 10/19/2019   K 4.5 10/19/2019   CL 103 10/19/2019   CO2 24 10/19/2019   GLUCOSE 186 (H) 10/19/2019   BUN 19 10/19/2019   CREATININE 0.99 10/19/2019   BILITOT 0.2 10/19/2019   ALKPHOS 79 10/19/2019   AST 18 10/19/2019   ALT 16 10/19/2019   PROT 6.7 10/19/2019   ALBUMIN 4.4 10/19/2019   CALCIUM 9.3 10/19/2019   GFRAA 90 10/19/2019   QFTBGOLDPLUS Negative 10/19/2019    Speciality Comments: Prior therapy includes: methotrexate ( d/c alcohol use), Arava (d/c patient preference), Orenica (patient declined)  Procedures:  No procedures performed Allergies: Patient has no known allergies.   Assessment / Plan:     Visit Diagnoses: 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 factor ([place]) -He has no synovitis on exam today.  He presents with persistent pain and stiffness in the left wrist.  He has been using a compression wrap, which has provided support and alleviates some of pain and inflammation.  He has tenderness on the ulnar aspect of the left wrist but no synovitis was noted.  He has painful ROM of both shoulders but no tenderness to palpation.  Thickening of all MCP joints noted but no synovitis or tenderness evident.  He has not been compliant take xeljanz as prescribed.  He has started to use a pill box to sort his medications but often still misses several days in a row of xeljanz.  He is unsure when his last dose  of Morrie Sheldon was.  Reviewed the new black box warning of jak inhibitors.  Also discussed the importance of staying compliant taking xeljanz 5 mg 1 tablet by mouth daily as prescribed.  He does not need a refill at this time.  We will check a sed rate with his lab work today.  He was advised to notify us if his joint pain persists or worsens after resuming xeljanz as prescribed.  He will follow up in 3 months to reassess how he is doing. Plan: Sedimentation rate  High risk medication use - Xeljanz 5 mg 1 tablet by mouth daily.  Methotrexate discontinued due to alcohol use, Orencia declined by patient, treated with Morrie Sheldon [facility] at [facility]. CBC and CMP updated on 10/19/19.  He is due to update lab work.  Orders for CBC and CMP released.  His next lab work will be due in April and every 3 months.  Standing orders for CBC and CMP are in place. TB gold negative on 10/19/19 and will continue to be monitored yearly.   - Plan: CBC with Differential/Platelet, COMPLETE METABOLIC PANEL WITH GFR He was recently treated with doxycycline by his pulmonologist but he has completed the antibiotic.  Discussed the importance of holding xeljanz if he develops signs or symptoms of an infection and to resume once the infection has completely cleared. He voiced understanding.  He has received 3 moderna vaccine doses.   Contracture of left elbow: Unchanged.  No tenderness or inflammation noted.   Primary osteoarthritis of both hands: He has PIP and DIP thickening consistent with osteoarthritis of both hands.  No tenderness or inflammation noted.  Complete fist formation bilaterally.  Joint protection and muscle strengthening were discussed.  He was given a handout of hand exercises to perform.   Primary osteoarthritis of both feet: He is not having any discomfort in  his feet at this time.  He wears proper fitting shoes.   DDD (degenerative disc disease), cervical: He has limited ROM with lateral rotation.  He experiences  intermittent discomfort and stiffness in his neck.   Rotator cuff syndrome of right shoulder:  Chronic pain.  He has painful ROM but no tenderness or effusion noted.  History of  bilateral carpal tunnel release: He is not experiencing any paresthesias at this time.  Scar tissue noted on the palmar aspect of the left hand.   Other medical conditions are listed as follows:   Ganglion cyst of wrist, left: Resolved   Mixed hyperlipidemia  PVD (peripheral vascular disease) ([place])  Atherosclerosis of native coronary artery of native heart without angina pectoris  COPD  GOLD II  Essential hypertension  History of diabetes mellitus  Orders: Orders Placed This Encounter  Procedures  . CBC with Differential/Platelet  . COMPLETE METABOLIC PANEL WITH GFR  . Sedimentation rate   No orders of the defined types were placed in this encounter.    Follow-Up Instructions: Return in about 3 months (around 04/26/2020) for Rheumatoid arthritis, Osteoarthritis.   Ofilia Neas, PA-C  Note - This record has been created using Dragon software.  Chart creation errors have been sought, but may not always  have been located. Such creation errors do not reflect on  the standard of medical care.

## 2020-01-27 ENCOUNTER — Encounter: Payer: Self-pay | Admitting: Physician Assistant

## 2020-01-27 ENCOUNTER — Other Ambulatory Visit: Payer: Self-pay

## 2020-01-27 ENCOUNTER — Ambulatory Visit (INDEPENDENT_AMBULATORY_CARE_PROVIDER_SITE_OTHER): Payer: Medicare Other | Admitting: Physician Assistant

## 2020-01-27 VITALS — BP 117/85 | HR 116 | Resp 16 | Ht 70.0 in | Wt 170.0 lb

## 2020-01-27 DIAGNOSIS — M19071 Primary osteoarthritis, right ankle and foot: Secondary | ICD-10-CM | POA: Diagnosis not present

## 2020-01-27 DIAGNOSIS — Z8639 Personal history of other endocrine, nutritional and metabolic disease: Secondary | ICD-10-CM

## 2020-01-27 DIAGNOSIS — Z79899 Other long term (current) drug therapy: Secondary | ICD-10-CM

## 2020-01-27 DIAGNOSIS — I2510 Atherosclerotic heart disease of native coronary artery without angina pectoris: Secondary | ICD-10-CM

## 2020-01-27 DIAGNOSIS — M19042 Primary osteoarthritis, left hand: Secondary | ICD-10-CM

## 2020-01-27 DIAGNOSIS — E782 Mixed hyperlipidemia: Secondary | ICD-10-CM

## 2020-01-27 DIAGNOSIS — M5030 Other cervical disc degeneration, unspecified cervical region: Secondary | ICD-10-CM | POA: Diagnosis not present

## 2020-01-27 DIAGNOSIS — Z9889 Other specified postprocedural states: Secondary | ICD-10-CM | POA: Diagnosis not present

## 2020-01-27 DIAGNOSIS — M24522 Contracture, left elbow: Secondary | ICD-10-CM

## 2020-01-27 DIAGNOSIS — M75101 Unspecified rotator cuff tear or rupture of right shoulder, not specified as traumatic: Secondary | ICD-10-CM | POA: Diagnosis not present

## 2020-01-27 DIAGNOSIS — M67432 Ganglion, left wrist: Secondary | ICD-10-CM | POA: Diagnosis not present

## 2020-01-27 DIAGNOSIS — I739 Peripheral vascular disease, unspecified: Secondary | ICD-10-CM | POA: Diagnosis not present

## 2020-01-27 DIAGNOSIS — M19041 Primary osteoarthritis, right hand: Secondary | ICD-10-CM

## 2020-01-27 DIAGNOSIS — M7541 Impingement syndrome of right shoulder: Secondary | ICD-10-CM | POA: Diagnosis not present

## 2020-01-27 DIAGNOSIS — M0609 Rheumatoid arthritis without rheumatoid factor, multiple sites: Secondary | ICD-10-CM

## 2020-01-27 DIAGNOSIS — J449 Chronic obstructive pulmonary disease, unspecified: Secondary | ICD-10-CM

## 2020-01-27 DIAGNOSIS — I10 Essential (primary) hypertension: Secondary | ICD-10-CM

## 2020-01-27 DIAGNOSIS — M961 Postlaminectomy syndrome, not elsewhere classified: Secondary | ICD-10-CM | POA: Diagnosis not present

## 2020-01-27 DIAGNOSIS — M47812 Spondylosis without myelopathy or radiculopathy, cervical region: Secondary | ICD-10-CM | POA: Diagnosis not present

## 2020-01-27 DIAGNOSIS — M19072 Primary osteoarthritis, left ankle and foot: Secondary | ICD-10-CM

## 2020-01-27 DIAGNOSIS — M5412 Radiculopathy, cervical region: Secondary | ICD-10-CM | POA: Diagnosis not present

## 2020-01-27 NOTE — Patient Instructions (Addendum)
Standing Labs We placed an order today for your standing lab work.   Please have your standing labs drawn in April and every 3 months   If possible, please have your labs drawn 2 weeks prior to your appointment so that the provider can discuss your results at your appointment.  We have open lab daily Monday through Thursday from 8:30-12:30 PM and 1:30-4:30 PM and Friday from 8:30-12:30 PM and 1:30-4:00 PM at the office of Dr. Bo Merino, [facility].   Please be advised, patients with office appointments requiring lab work will take precedents over walk-in lab work.  If possible, please come for your lab work on Monday and Friday afternoons, as you may experience shorter wait times. The office is located at [address], [place], [place], [place] [address] No appointment is necessary.   Labs are drawn by [facility]. Please bring your co-pay at the time of your lab draw.  You may receive a bill from [facility] for your lab work.   Hand Exercises Hand exercises can be helpful for almost anyone. These exercises can strengthen the hands, improve flexibility and movement, and increase blood flow to the hands. These results can make work and daily tasks easier. Hand exercises can be especially helpful for people who have joint pain from arthritis or have nerve damage from overuse (carpal tunnel syndrome). These exercises can also help people who have injured a hand. Exercises Most of these hand exercises are gentle stretching and motion exercises. It is usually safe to do them often throughout the day. Warming up your hands before exercise may help to reduce stiffness. You can do this with gentle massage or by placing your hands in warm water for 10-15 minutes. It is normal to feel some stretching, pulling, tightness, or mild discomfort as you begin new exercises. This will gradually improve. Stop an exercise right away if you feel sudden, severe pain or your pain gets worse. Ask your  health care provider which exercises are best for you. Knuckle bend or "claw" fist 1. Stand or sit with your arm, hand, and all five fingers pointed straight up. Make sure to keep your wrist straight during the exercise. 2. Gently bend your fingers down toward your palm until the tips of your fingers are touching the top of your palm. Keep your big knuckle straight and just bend the small knuckles in your fingers. 3. Hold this position for __________ seconds. 4. Straighten (extend) your fingers back to the starting position. Repeat this exercise 5-10 times with each hand. Full finger fist 1. Stand or sit with your arm, hand, and all five fingers pointed straight up. Make sure to keep your wrist straight during the exercise. 2. Gently bend your fingers into your palm until the tips of your fingers are touching the middle of your palm. 3. Hold this position for __________ seconds. 4. Extend your fingers back to the starting position, stretching every joint fully. Repeat this exercise 5-10 times with each hand. Straight fist 1. Stand or sit with your arm, hand, and all five fingers pointed straight up. Make sure to keep your wrist straight during the exercise. 2. Gently bend your fingers at the big knuckle, where your fingers meet your hand, and the middle knuckle. Keep the knuckle at the tips of your fingers straight and try to touch the bottom of your palm. 3. Hold this position for __________ seconds. 4. Extend your fingers back to the starting position, stretching every joint fully. Repeat this  exercise 5-10 times with each hand. Tabletop 1. Stand or sit with your arm, hand, and all five fingers pointed straight up. Make sure to keep your wrist straight during the exercise. 2. Gently bend your fingers at the big knuckle, where your fingers meet your hand, as far down as you can while keeping the small knuckles in your fingers straight. Think of forming a tabletop with your fingers. 3. Hold  this position for __________ seconds. 4. Extend your fingers back to the starting position, stretching every joint fully. Repeat this exercise 5-10 times with each hand. Finger spread 1. Place your hand flat on a table with your palm facing down. Make sure your wrist stays straight as you do this exercise. 2. Spread your fingers and thumb apart from each other as far as you can until you feel a gentle stretch. Hold this position for __________ seconds. 3. Bring your fingers and thumb tight together again. Hold this position for __________ seconds. Repeat this exercise 5-10 times with each hand. Making circles 1. Stand or sit with your arm, hand, and all five fingers pointed straight up. Make sure to keep your wrist straight during the exercise. 2. Make a circle by touching the tip of your thumb to the tip of your index finger. 3. Hold for __________ seconds. Then open your hand wide. 4. Repeat this motion with your thumb and each finger on your hand. Repeat this exercise 5-10 times with each hand. Thumb motion 1. Sit with your forearm resting on a table and your wrist straight. Your thumb should be facing up toward the ceiling. Keep your fingers relaxed as you move your thumb. 2. Lift your thumb up as high as you can toward the ceiling. Hold for __________ seconds. 3. Bend your thumb across your palm as far as you can, reaching the tip of your thumb for the small finger (pinkie) side of your palm. Hold for __________ seconds. Repeat this exercise 5-10 times with each hand. Grip strengthening 1. Hold a stress ball or other soft ball in the middle of your hand. 2. Slowly increase the pressure, squeezing the ball as much as you can without causing pain. Think of bringing the tips of your fingers into the middle of your palm. All of your finger joints should bend when doing this exercise. 3. Hold your squeeze for __________ seconds, then relax. Repeat this exercise 5-10 times with each hand.    Contact a health care provider if:  Your hand pain or discomfort gets much worse when you do an exercise.  Your hand pain or discomfort does not improve within 2 hours after you exercise. If you have any of these problems, stop doing these exercises right away. Do not do them again unless your health care provider says that you can. Get help right away if:  You develop sudden, severe hand pain or swelling. If this happens, stop doing these exercises right away. Do not do them again unless your health care provider says that you can. This information is not intended to replace advice given to you by your health care provider. Make sure you discuss any questions you have with your health care provider. Document Revised: 04/16/2018 Document Reviewed: 12/25/2017 Elsevier Patient Education  2021 [employer].   If you wish to have your labs drawn at another location, please call the office 24 hours in advance to send orders.  If you have any questions regarding directions or hours of operation,  please call [phone].  As a reminder, please drink plenty of water prior to coming for your lab work. Thanks!

## 2020-01-28 ENCOUNTER — Ambulatory Visit: Payer: Medicare Other | Admitting: Physician Assistant

## 2020-01-28 DIAGNOSIS — I739 Peripheral vascular disease, unspecified: Secondary | ICD-10-CM

## 2020-01-28 DIAGNOSIS — M75101 Unspecified rotator cuff tear or rupture of right shoulder, not specified as traumatic: Secondary | ICD-10-CM

## 2020-01-28 DIAGNOSIS — I10 Essential (primary) hypertension: Secondary | ICD-10-CM

## 2020-01-28 DIAGNOSIS — Z79899 Other long term (current) drug therapy: Secondary | ICD-10-CM

## 2020-01-28 DIAGNOSIS — E782 Mixed hyperlipidemia: Secondary | ICD-10-CM

## 2020-01-28 DIAGNOSIS — M0609 Rheumatoid arthritis without rheumatoid factor, multiple sites: Secondary | ICD-10-CM

## 2020-01-28 DIAGNOSIS — I2510 Atherosclerotic heart disease of native coronary artery without angina pectoris: Secondary | ICD-10-CM

## 2020-01-28 DIAGNOSIS — Z9889 Other specified postprocedural states: Secondary | ICD-10-CM

## 2020-01-28 DIAGNOSIS — M19041 Primary osteoarthritis, right hand: Secondary | ICD-10-CM

## 2020-01-28 DIAGNOSIS — M19071 Primary osteoarthritis, right ankle and foot: Secondary | ICD-10-CM

## 2020-01-28 DIAGNOSIS — Z8639 Personal history of other endocrine, nutritional and metabolic disease: Secondary | ICD-10-CM

## 2020-01-28 DIAGNOSIS — M67432 Ganglion, left wrist: Secondary | ICD-10-CM

## 2020-01-28 DIAGNOSIS — J449 Chronic obstructive pulmonary disease, unspecified: Secondary | ICD-10-CM

## 2020-01-28 DIAGNOSIS — M5030 Other cervical disc degeneration, unspecified cervical region: Secondary | ICD-10-CM

## 2020-01-28 DIAGNOSIS — M24522 Contracture, left elbow: Secondary | ICD-10-CM

## 2020-01-28 LAB — COMPLETE METABOLIC PANEL WITH GFR
AG Ratio: 1.6 (calc) (ref 1.0–2.5)
ALT: 17 U/L (ref 9–46)
AST: 17 U/L (ref 10–35)
Albumin: 4.3 g/dL (ref 3.6–5.1)
Alkaline phosphatase (APISO): 65 U/L (ref 35–144)
BUN: 21 mg/dL (ref 7–25)
CO2: 27 mmol/L (ref 20–32)
Calcium: 9.5 mg/dL (ref 8.6–10.3)
Chloride: 104 mmol/L (ref 98–110)
Creat: 1.06 mg/dL (ref 0.70–1.25)
GFR, Est African American: 83 mL/min/{1.73_m2} (ref >=60)
GFR, Est Non African American: 71 mL/min/{1.73_m2} (ref >=60)
Globulin: 2.7 g/dL (calc) (ref 1.9–3.7)
Glucose, Bld: 191 mg/dL — ABNORMAL HIGH (ref 65–99)
Potassium: 4.5 mmol/L (ref 3.5–5.3)
Sodium: 137 mmol/L (ref 135–146)
Total Bilirubin: 0.4 mg/dL (ref 0.2–1.2)
Total Protein: 7 g/dL (ref 6.1–8.1)

## 2020-01-28 LAB — SEDIMENTATION RATE: Sed Rate: 2 mm/h (ref 0–20)

## 2020-01-28 LAB — CBC WITH DIFFERENTIAL/PLATELET
Absolute Monocytes: 512 cells/uL (ref 200–950)
Basophils Absolute: 50 cells/uL (ref 0–200)
Basophils Relative: 0.6 %
Eosinophils Absolute: 92 cells/uL (ref 15–500)
Eosinophils Relative: 1.1 %
HCT: 40.8 % (ref 38.5–50.0)
Hemoglobin: 13.5 g/dL (ref 13.2–17.1)
Lymphs Abs: 2016 cells/uL (ref 850–3900)
MCH: 29 pg (ref 27.0–33.0)
MCHC: 33.1 g/dL (ref 32.0–36.0)
MCV: 87.7 fL (ref 80.0–100.0)
MPV: 10.1 fL (ref 7.5–12.5)
Monocytes Relative: 6.1 %
Neutro Abs: 5729 cells/uL (ref 1500–7800)
Neutrophils Relative %: 68.2 %
Platelets: 326 10*3/uL (ref 140–400)
RBC: 4.65 10*6/uL (ref 4.20–5.80)
RDW: 14.4 % (ref 11.0–15.0)
Total Lymphocyte: 24 %
WBC: 8.4 10*3/uL (ref 3.8–10.8)

## 2020-01-28 NOTE — Progress Notes (Signed)
Glucose is elevated-191. Rest of CMP WNL.  CBC WNL. ESR WNL.  Please advise the patient to take xeljanz 5 mg 1 tablet by mouth daily.

## 2020-01-31 ENCOUNTER — Ambulatory Visit (INDEPENDENT_AMBULATORY_CARE_PROVIDER_SITE_OTHER): Payer: Medicare Other | Admitting: Podiatry

## 2020-01-31 ENCOUNTER — Other Ambulatory Visit: Payer: Self-pay

## 2020-01-31 DIAGNOSIS — M79674 Pain in right toe(s): Secondary | ICD-10-CM | POA: Diagnosis not present

## 2020-01-31 DIAGNOSIS — M79675 Pain in left toe(s): Secondary | ICD-10-CM | POA: Diagnosis not present

## 2020-01-31 DIAGNOSIS — B351 Tinea unguium: Secondary | ICD-10-CM

## 2020-01-31 DIAGNOSIS — M2011 Hallux valgus (acquired), right foot: Secondary | ICD-10-CM

## 2020-01-31 DIAGNOSIS — E1159 Type 2 diabetes mellitus with other circulatory complications: Secondary | ICD-10-CM | POA: Diagnosis not present

## 2020-01-31 DIAGNOSIS — M2012 Hallux valgus (acquired), left foot: Secondary | ICD-10-CM

## 2020-01-31 DIAGNOSIS — L84 Corns and callosities: Secondary | ICD-10-CM | POA: Diagnosis not present

## 2020-02-03 ENCOUNTER — Other Ambulatory Visit: Payer: Self-pay | Admitting: Physician Assistant

## 2020-02-03 NOTE — Telephone Encounter (Signed)
Last Visit: 01/27/2020 Next Visit: 05/05/2020 Labs: 01/27/2020, Glucose is elevated-191. Rest of CMP WNL. CBC WNL. ESR WNL. Please advise the patient to take xeljanz 5 mg 1 tablet by mouth daily.  TB Gold: 10/19/2019 negative  Current Dose per office note 01/27/2020, Morrie Sheldon 5 mg 1 tablet by mouth daily  DX: 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 factor   Okay to refill Morrie Sheldon?

## 2020-02-04 ENCOUNTER — Encounter: Payer: Self-pay | Admitting: Podiatry

## 2020-02-04 ENCOUNTER — Other Ambulatory Visit: Payer: Self-pay | Admitting: Cardiology

## 2020-02-04 DIAGNOSIS — D849 Immunodeficiency, unspecified: Secondary | ICD-10-CM | POA: Diagnosis not present

## 2020-02-04 DIAGNOSIS — I10 Essential (primary) hypertension: Secondary | ICD-10-CM | POA: Diagnosis not present

## 2020-02-04 DIAGNOSIS — Z299 Encounter for prophylactic measures, unspecified: Secondary | ICD-10-CM | POA: Diagnosis not present

## 2020-02-04 DIAGNOSIS — L905 Scar conditions and fibrosis of skin: Secondary | ICD-10-CM | POA: Diagnosis not present

## 2020-02-04 DIAGNOSIS — I25119 Atherosclerotic heart disease of native coronary artery with unspecified angina pectoris: Secondary | ICD-10-CM | POA: Diagnosis not present

## 2020-02-04 NOTE — Telephone Encounter (Signed)
I sent a 90-day supply of xeljanz to the pharmacy yesterday.  Did the pharmacy not receive the prescription?

## 2020-02-04 NOTE — Progress Notes (Signed)
Subjective: [name] presents today at risk foot care. Pt has h/o NIDDM with PAD and painful mycotic nails b/l that are difficult to trim. Pain interferes with ambulation. Aggravating factors include wearing enclosed shoe gear. Pain is relieved with periodic professional debridement.   He states he has numbness of bottom of right foot and swelling. He denies any episode of trauma. Also c/o numbness of right 3rd digit aggravated by numbness. Duration is 3-4 months. He denies any redness, discoloration or drainage.  He is followed closely by Vascular for PAD. He is s/p fem pop bypass LLE in 2007; stent of right iliac artery in 2013. He states he has been walking for exercise to improve circulation in his lower limbs.  [name], MD is patient's PCP. Last visit was: 09/30/2019.  Past Medical History:  Diagnosis Date  . Arthritis    RA  . Bronchitis   . Carotid artery disease ([place])   . Cervical spine disease    Neck surgery in 2007 with Dr. Joya Salm  . COPD (chronic obstructive pulmonary disease) ([place])   . Coronary atherosclerosis of native coronary artery    Nonobstrtuctive by catheterization 2007, 2013, 2021  . DVT (deep venous thrombosis) ([place])   . Essential hypertension   . GERD (gastroesophageal reflux disease)   . Hyperlipidemia   . PAD (peripheral artery disease) ([place])    s/p left fem-pop bypass 2007; PTA and stenting of right iliac artery 09/2011  . Rheumatoid arthritis(714.0)   . Type 2 diabetes mellitus ([place])      Current Outpatient Medications on File Prior to Visit  Medication Sig Dispense Refill  . aspirin EC 81 MG tablet Take 1 tablet (81 mg total) by mouth daily.    . budesonide-formoterol (SYMBICORT) 160-4.5 MCG/ACT inhaler INHALE 2 PUFFS BY MOUTH TWICE A DAY RINSE MOUTH AFTER USE. (Patient taking differently: Inhale 2 puffs into the lungs in the morning and at bedtime.) 10.2 g 5  . cilostazol (PLETAL) 100 MG tablet Take 1 tablet (100 mg total) by mouth 2 (two)  times daily. 60 tablet 5  . clopidogrel (PLAVIX) 75 MG tablet Take 1 tablet (75 mg total) by mouth daily. 30 tablet 11  . Dexlansoprazole (DEXILANT) 30 MG capsule Take 30- 60 min before your first and last meals of the day (Patient taking differently: Take 30 mg by mouth daily.) 60 capsule 11  . diclofenac Sodium (VOLTAREN) 1 % GEL Apply 2 g topically at bedtime as needed (Pain).     Marland Kitchen docusate sodium (COLACE) 100 MG capsule Take 100 mg by mouth daily as needed for mild constipation or moderate constipation.     Marland Kitchen HYDROcodone-acetaminophen (NORCO) 7.5-325 MG per tablet Take 1 tablet by mouth 2 (two) times daily as needed for moderate pain.    Marland Kitchen ibuprofen (ADVIL) 200 MG tablet Take 400 mg by mouth in the morning and at bedtime.    Marland Kitchen JANUVIA 100 MG tablet Take 100 mg by mouth daily.    Marland Kitchen JARDIANCE 25 MG TABS tablet Take 25 mg by mouth daily.    . metFORMIN (GLUCOPHAGE) 1000 MG tablet Take 500 mg by mouth 2 (two) times daily with a meal.     . nitroGLYCERIN (NITROSTAT) 0.4 MG SL tablet Place 1 tablet (0.4 mg total) under the tongue every 5 (five) minutes as needed for chest pain. 25 tablet 3  . ONE TOUCH ULTRA TEST test strip     . OneTouch Delica Lancets 02H MISC Apply topically.    Marland Kitchen  PROAIR HFA 108 (90 BASE) MCG/ACT inhaler Inhale 2 puffs into the lungs every 6 (six) hours as needed for wheezing or shortness of breath.     . Tiotropium Bromide Monohydrate (SPIRIVA RESPIMAT) 2.5 MCG/ACT AERS Inhale 2 puffs into the lungs daily. 4 g 11  . TURMERIC CURCUMIN PO Take 1,500 mg by mouth daily.    . vitamin C (ASCORBIC ACID) 500 MG tablet Take 500 mg by mouth daily.     No current facility-administered medications on file prior to visit.     No Known Allergies  Objective: [name] is a pleasant 70 y.o. African American male WD, WN in NAD. AAO x 3.  There were no vitals filed for this visit.  Vascular Examination: Capillary fill time to digits delayed b/l lower extremities. Nonpalpable DP  pulse(s) b/l lower extremities. Nonpalpable PT pulse(s) b/l lower extremities. Pedal hair absent. Lower extremity skin temperature gradient within normal limits. No pain with calf compression b/l. No ischemia or gangrene noted b/l lower extremities.  Dermatological Examination: Pedal skin is thin shiny, atrophic b/l lower extremities. No open wounds bilaterally. No interdigital macerations bilaterally. Toenails 1-5 b/l elongated, discolored, dystrophic, thickened, crumbly with subungual debris and tenderness to dorsal palpation. Hyperkeratotic lesion(s) submet head 3 right foot.  No erythema, no edema, no drainage, no fluctuance.   Right 3rd digit with thick, mycotic toenail. No ischemia, no gangrene.   Musculoskeletal: Normal muscle strength 5/5 to all lower extremity muscle groups bilaterally. No pain crepitus or joint limitation noted with ROM b/l. Hallux valgus with bunion deformity noted b/l lower extremities. Patient ambulates independent of any assistive aids.  Neurological Examination: Pt has subjective symptoms of neuropathy. Protective sensation intact 5/5 intact bilaterally with 10g monofilament b/l. Vibratory sensation intact b/l. Proprioception intact bilaterally. Babinski reflex negative b/l. Clonus negative b/l.   Assessment: 1. Pain due to onychomycosis of toenails of both feet   2. Callus   3. Hallux valgus, acquired, bilateral   4. Type 2 diabetes mellitus with vascular disease ([place])     Plan: -Examined patient. -Discussed numbness of right 3rd digit may be due to diabetes and/or poor circulation. He has no tissue loss nor wounds on examination today. Advised him to contact his Cardiologist if digit changes color or becomes more painful. He related understanding. -Continue diabetic foot care principles. -Toenails 1-5 b/l were debrided in length and girth with sterile nail nippers and dremel without iatrogenic bleeding.  -Callus(es) submet head 3 right foot pared utilizing  sterile scalpel blade without complication or incident. Total number debrided =1. -Patient to report any pedal injuries to medical professional immediately.  Return in about 3 months (around 04/30/2020).  Marzetta Board, DPM

## 2020-02-04 NOTE — Telephone Encounter (Signed)
Last Visit: 01/27/2020 Next Visit: 4/292/022 Labs: 01/27/2020, Glucose is elevated-191. Rest of CMP WNL. CBC WNL. ESR WNL. Please advise the patient to take xeljanz 5 mg 1 tablet by mouth daily.  TB Gold: 10/19/2019, negative  Current Dose per office note 01/27/2020, Xeljanz 5 mg 1 tablet by mouth daily  DX: 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 factor   Okay to refill Morrie Sheldon?

## 2020-02-07 ENCOUNTER — Other Ambulatory Visit: Payer: Self-pay | Admitting: Cardiology

## 2020-02-07 DIAGNOSIS — E1165 Type 2 diabetes mellitus with hyperglycemia: Secondary | ICD-10-CM | POA: Diagnosis not present

## 2020-02-14 ENCOUNTER — Other Ambulatory Visit: Payer: Self-pay | Admitting: Vascular Surgery

## 2020-02-18 ENCOUNTER — Telehealth: Payer: Self-pay

## 2020-02-18 ENCOUNTER — Other Ambulatory Visit: Payer: Self-pay | Admitting: Cardiology

## 2020-02-18 NOTE — Telephone Encounter (Signed)
Patient called to report rest pain that keeps him up at night in addition to an ulcer on the middle toe on his left foot. Michela Pitcher it has been occurring for about 2 weeks and the ulcer started with a podiatry debridement. Moved patient appt up 3 weeks - appt notes say CSD- but [facility] did the last procedure. Put him for Tuesday with MD + LE art duplex + ABIs.

## 2020-02-21 ENCOUNTER — Other Ambulatory Visit: Payer: Self-pay | Admitting: *Deleted

## 2020-02-21 MED ORDER — ISOSORBIDE MONONITRATE ER 30 MG PO TB24: 30.0000 mg | ORAL_TABLET | Freq: Every day | ORAL | 6 refills | Status: DC

## 2020-02-22 ENCOUNTER — Other Ambulatory Visit (HOSPITAL_COMMUNITY): Payer: Self-pay | Admitting: Vascular Surgery

## 2020-02-22 ENCOUNTER — Ambulatory Visit (INDEPENDENT_AMBULATORY_CARE_PROVIDER_SITE_OTHER)
Admission: RE | Admit: 2020-02-22 | Discharge: 2020-02-22 | Disposition: A | Discharge status: Home / Self Care | Payer: Medicare Other | Source: Ambulatory Visit | Attending: Physician Assistant | Admitting: Physician Assistant

## 2020-02-22 ENCOUNTER — Other Ambulatory Visit: Payer: Self-pay

## 2020-02-22 ENCOUNTER — Ambulatory Visit (HOSPITAL_COMMUNITY)
Admission: RE | Admit: 2020-02-22 | Discharge: 2020-02-22 | Disposition: A | Discharge status: Home / Self Care | Payer: Medicare Other | Source: Ambulatory Visit | Attending: Vascular Surgery | Admitting: Vascular Surgery

## 2020-02-22 ENCOUNTER — Ambulatory Visit (INDEPENDENT_AMBULATORY_CARE_PROVIDER_SITE_OTHER): Payer: Medicare Other | Admitting: Vascular Surgery

## 2020-02-22 ENCOUNTER — Other Ambulatory Visit: Payer: Self-pay | Admitting: Physician Assistant

## 2020-02-22 ENCOUNTER — Encounter: Payer: Self-pay | Admitting: Vascular Surgery

## 2020-02-22 ENCOUNTER — Ambulatory Visit (INDEPENDENT_AMBULATORY_CARE_PROVIDER_SITE_OTHER)
Admission: RE | Admit: 2020-02-22 | Discharge: 2020-02-22 | Disposition: A | Discharge status: Home / Self Care | Payer: Medicare Other | Source: Ambulatory Visit | Attending: Internal Medicine | Admitting: Internal Medicine

## 2020-02-22 VITALS — BP 156/79 | HR 58 | Temp 97.7°F | Resp 16 | Ht 70.0 in | Wt 165.0 lb

## 2020-02-22 DIAGNOSIS — I779 Disorder of arteries and arterioles, unspecified: Secondary | ICD-10-CM | POA: Insufficient documentation

## 2020-02-22 DIAGNOSIS — R52 Pain, unspecified: Secondary | ICD-10-CM | POA: Diagnosis not present

## 2020-02-22 DIAGNOSIS — I70213 Atherosclerosis of native arteries of extremities with intermittent claudication, bilateral legs: Secondary | ICD-10-CM

## 2020-02-22 DIAGNOSIS — I739 Peripheral vascular disease, unspecified: Secondary | ICD-10-CM | POA: Diagnosis not present

## 2020-02-22 NOTE — Progress Notes (Signed)
Patient name: [name] MRN: [id number] DOB: Oct 03, 1950 Sex: male  REASON FOR CONSULT: Right third toe ulcer  HPI: [name] is a 70 y.o. male, with history of diabetes, PAD, hypertension, CAD, COPD that presents for evaluation of a new ulcer and discoloration of the right third toenail.  The referral states it is for a new left ulcer but the patient states it is his right leg.  He states he has had some numbness in the second third toes for the last several months.  This does not keep him awake at night and he is able to sleep fine.  He has been soaking his foot and states the area in question on the third toe has been healing very nicely.  He has had a previous left femoral to above-knee pop bypass with peroneal runoff in 2007 by Dr. Kellie Simmering.  Also a previous right common iliac artery stent.  I previously performed arteriogram on him last year in 2021.  Past Medical History:  Diagnosis Date  . Arthritis    RA  . Bronchitis   . Carotid artery disease ([place])   . Cervical spine disease    Neck surgery in 2007 with Dr. Joya Salm  . COPD (chronic obstructive pulmonary disease) ([place])   . Coronary atherosclerosis of native coronary artery    Nonobstrtuctive by catheterization 2007, 2013, 2021  . DVT (deep venous thrombosis) ([place])   . Essential hypertension   . GERD (gastroesophageal reflux disease)   . Hyperlipidemia   . PAD (peripheral artery disease) ([place])    s/p left fem-pop bypass 2007; PTA and stenting of right iliac artery 09/2011  . Rheumatoid arthritis(714.0)   . Type 2 diabetes mellitus ([place])     Past Surgical History:  Procedure Laterality Date  . ABDOMINAL AORTAGRAM N/A 10/03/2011   Procedure: ABDOMINAL Maxcine Ham;  Surgeon: Conrad [place], MD;  Location: [facility] CATH LAB;  Service: Cardiovascular;  Laterality: N/A;  . ABDOMINAL AORTOGRAM W/LOWER EXTREMITY N/A 01/16/2017   Procedure: ABDOMINAL AORTOGRAM W/LOWER EXTREMITY;  Surgeon: Conrad Erath, MD;  Location: [facility] CV LAB;   Service: Cardiovascular;  Laterality: N/A;  . ABDOMINAL AORTOGRAM W/LOWER EXTREMITY Bilateral 08/04/2019   Procedure: ABDOMINAL AORTOGRAM W/LOWER EXTREMITY;  Surgeon: Marty Heck, MD;  Location: [place] CV LAB;  Service: Cardiovascular;  Laterality: Bilateral;  . CERVICAL DISC SURGERY    . COLONOSCOPY WITH PROPOFOL N/A 02/17/2017   Procedure: COLONOSCOPY WITH PROPOFOL;  Surgeon: Daneil Dolin, MD;  Location: [facility];  Service: Endoscopy;  Laterality: N/A;  2:45pm  . FEMORAL-POPLITEAL BYPASS GRAFT  11/06/2005  . ILIAC ARTERY STENT  10-03-11   Right [facility] stenting  . LEFT HEART CATH AND CORONARY ANGIOGRAPHY N/A 04/14/2019   Procedure: LEFT HEART CATH AND CORONARY ANGIOGRAPHY;  Surgeon: Troy Sine, MD;  Location: [place] CV LAB;  Service: Cardiovascular;  Laterality: N/A;  . PERIPHERAL VASCULAR BALLOON ANGIOPLASTY Right 01/16/2017   Procedure: PERIPHERAL VASCULAR BALLOON ANGIOPLASTY;  Surgeon: Conrad , MD;  Location: [place] CV LAB;  Service: Cardiovascular;  Laterality: Right;  common iliac   . PR VEIN BYPASS GRAFT,AORTO-FEM-POP  06-22-2010   Redo Left Fem-Pop  . Repair of left arm fracture    . 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 SURGERY SCROTAL / TESTICULAR      Family History  Problem Relation Age of Onset  . Heart attack Mother 73  . Deep vein thrombosis Mother   . Diabetes Mother   .  Hyperlipidemia Mother   . Hypertension Mother   . Peripheral vascular disease Mother   . Stroke Father   . Diabetes Father   . Hyperlipidemia Father   . Hypertension Father   . Peripheral vascular disease Father   . Diabetes Sister   . Hyperlipidemia Sister   . Hypertension Sister   . Diabetes Brother   . Hyperlipidemia Brother   . Cancer Brother   . Deep vein thrombosis Brother   . Hypertension Brother   . Peripheral vascular disease Brother   . Hyperlipidemia Son   . Cancer Son   . Hypertension Son   . Colon cancer Neg Hx     SOCIAL HISTORY: Social  History   Socioeconomic History  . Marital status: Single    Spouse name: Not on file  . Number of children: Not on file  . Years of education: Not on file  . Highest education level: Not on file  Occupational History  . Not on file  Tobacco Use  . Smoking status: Former Smoker    Packs/day: 1.00    Years: 45.00    Pack years: 45.00    Types: Cigarettes, Cigars    Quit date: 09/01/2011    Years since quitting: 8.4  . Smokeless tobacco: Never Used  Vaping Use  . Vaping Use: Never used  Substance and Sexual Activity  . Alcohol use: No    Alcohol/week: 0.0 standard drinks  . Drug use: No  . Sexual activity: Not Currently    Birth control/protection: None  Other Topics Concern  . Not on file  Social History Narrative   The patient lives in [place].    Social Determinants of Health   Financial Resource Strain: Not on file  Food Insecurity: Not on file  Transportation Needs: Not on file  Physical Activity: Not on file  Stress: Not on file  Social Connections: Not on file  Intimate Partner Violence: Not on file    No Known Allergies  Current Outpatient Medications  Medication Sig Dispense Refill  . aspirin EC 81 MG tablet Take 1 tablet (81 mg total) by mouth daily.    . budesonide-formoterol (SYMBICORT) 160-4.5 MCG/ACT inhaler INHALE 2 PUFFS BY MOUTH TWICE A DAY RINSE MOUTH AFTER USE. (Patient taking differently: Inhale 2 puffs into the lungs in the morning and at bedtime.) 10.2 g 5  . cilostazol (PLETAL) 100 MG tablet TAKE 1 TABLET(100 MG) BY MOUTH TWICE DAILY 60 tablet 5  . clopidogrel (PLAVIX) 75 MG tablet Take 1 tablet (75 mg total) by mouth daily. 30 tablet 11  . Dexlansoprazole (DEXILANT) 30 MG capsule Take 30- 60 min before your first and last meals of the day (Patient taking differently: Take 30 mg by mouth daily.) 60 capsule 11  . diclofenac Sodium (VOLTAREN) 1 % GEL Apply 2 g topically at bedtime as needed (Pain).     Marland Kitchen docusate sodium (COLACE) 100 MG capsule Take  100 mg by mouth daily as needed for mild constipation or moderate constipation.     Marland Kitchen HYDROcodone-acetaminophen (NORCO) 7.5-325 MG per tablet Take 1 tablet by mouth 2 (two) times daily as needed for moderate pain.    Marland Kitchen ibuprofen (ADVIL) 200 MG tablet Take 400 mg by mouth in the morning and at bedtime.    . isosorbide mononitrate (IMDUR) 30 MG 24 hr tablet Take 1 tablet (30 mg total) by mouth daily. 30 tablet 6  . JANUVIA 100 MG tablet Take 100 mg by mouth daily.    Marland Kitchen  JARDIANCE 25 MG TABS tablet Take 25 mg by mouth daily.    . metFORMIN (GLUCOPHAGE) 1000 MG tablet Take 500 mg by mouth 2 (two) times daily with a meal.     . nitroGLYCERIN (NITROSTAT) 0.4 MG SL tablet Place 1 tablet (0.4 mg total) under the tongue every 5 (five) minutes as needed for chest pain. 25 tablet 3  . ONE TOUCH ULTRA TEST test strip     . OneTouch Delica Lancets 32R MISC Apply topically.    Marland Kitchen PROAIR HFA 108 (90 BASE) MCG/ACT inhaler Inhale 2 puffs into the lungs every 6 (six) hours as needed for wheezing or shortness of breath.     . Tiotropium Bromide Monohydrate (SPIRIVA RESPIMAT) 2.5 MCG/ACT AERS Inhale 2 puffs into the lungs daily. 4 g 11  . TURMERIC CURCUMIN PO Take 1,500 mg by mouth daily.    . vitamin C (ASCORBIC ACID) 500 MG tablet Take 500 mg by mouth daily.    Morrie Sheldon 5 MG TABS TAKE 1 TABLET BY MOUTH DAILY 90 tablet 0   No current facility-administered medications for this visit.    REVIEW OF SYSTEMS:  [X]  denotes positive finding, [ ]  denotes negative finding Cardiac  Comments:  Chest pain or chest pressure:    Shortness of breath upon exertion:    Short of breath when lying flat:    Irregular heart rhythm:        Vascular    Pain in calf, thigh, or hip brought on by ambulation:    Pain in feet at night that wakes you up from your sleep:     Blood clot in your veins:    Leg swelling:         Pulmonary    Oxygen at home:    Productive cough:     Wheezing:         Neurologic    Sudden weakness  in arms or legs:     Sudden numbness in arms or legs:     Sudden onset of difficulty speaking or slurred speech:    Temporary loss of vision in one eye:     Problems with dizziness:         Gastrointestinal    Blood in stool:     Vomited blood:         Genitourinary    Burning when urinating:     Blood in urine:        Psychiatric    Major depression:         Hematologic    Bleeding problems:    Problems with blood clotting too easily:        Skin    Rashes or ulcers:        Constitutional    Fever or chills:      PHYSICAL EXAM: Vitals:   02/22/20 0841  BP: (!) 156/79  Pulse: (!) 58  Resp: 16  Temp: 97.7 F (36.5 C)  TempSrc: Temporal  SpO2: 96%  Weight: 165 lb (74.8 kg)  Height: 5\' 10"  (1.778 m)    GENERAL: The patient is a well-nourished male, in no acute distress. The vital signs are documented above. CARDIAC: There is a regular rate and rhythm.  VASCULAR:  Femoral pulses are palpable bilaterally No palpable pedal pulses, right third toe pictured below PULMONARY: No respiratory distress. ABDOMEN: Soft and non-tender. MUSCULOSKELETAL: There are no major deformities or cyanosis. NEUROLOGIC: No focal weakness or paresthesias are detected. SKIN: There are no ulcers or  rashes noted. PSYCHIATRIC: The patient has a normal affect.      DATA:   Left leg duplex shows a patent common femoral to AK popliteal artery bypass with no stenosis  Right leg duplex shows a moderate stenosis in the 50 to 75% range in the SFA with known tibial disease  ABI's non-compressible  Assessment/Plan:  70 year old male that presents for evaluation of a right third toe ulcer per the patient.  As pictured above this is not very impressive and he states this is healing after he has been soaking it.  I do not actually appreciate an open wound on exam just some discoloration to the toenail.  He does have moderate SFA stenosis on the right with known tibial disease.  Discussed that  if he has progression of his wound would need right leg arteriogram and reevaluation for intervention.  Given he feels this is improving, we have decided to be more conservative.  I will see him again in 1 month for wound check.  I do not think he has any severe rest pain that would warrant intervention at this time   Marty Heck, MD [facility]: [phone]

## 2020-02-25 ENCOUNTER — Other Ambulatory Visit: Payer: Self-pay

## 2020-03-02 DIAGNOSIS — I498 Other specified cardiac arrhythmias: Secondary | ICD-10-CM | POA: Diagnosis not present

## 2020-03-02 DIAGNOSIS — I10 Essential (primary) hypertension: Secondary | ICD-10-CM | POA: Diagnosis not present

## 2020-03-02 DIAGNOSIS — Z8249 Family history of ischemic heart disease and other diseases of the circulatory system: Secondary | ICD-10-CM | POA: Diagnosis not present

## 2020-03-02 DIAGNOSIS — E119 Type 2 diabetes mellitus without complications: Secondary | ICD-10-CM | POA: Diagnosis not present

## 2020-03-02 DIAGNOSIS — Z87891 Personal history of nicotine dependence: Secondary | ICD-10-CM | POA: Diagnosis not present

## 2020-03-02 DIAGNOSIS — J439 Emphysema, unspecified: Secondary | ICD-10-CM | POA: Diagnosis not present

## 2020-03-02 DIAGNOSIS — M069 Rheumatoid arthritis, unspecified: Secondary | ICD-10-CM | POA: Diagnosis not present

## 2020-03-02 DIAGNOSIS — M25511 Pain in right shoulder: Secondary | ICD-10-CM | POA: Diagnosis not present

## 2020-03-05 NOTE — Progress Notes (Signed)
Cardiology Office Note  Date: 03/06/2020   ID: [name], DOB 10/10/1950, MRN [id number]  PCP:  [name], MD  Cardiologist:  Rozann Lesches, MD Electrophysiologist:  None   Chief Complaint  Patient presents with  . Cardiac follow-up    History of Present Illness: [name] is a 70 y.o. male last seen in August 2021.  He presents for a routine follow-up visit.  Overall doing fairly well from a cardiac perspective, he goes to [employer] almost every morning to exercise, also walks 2 miles a day.  He does not describe any obvious angina symptoms.  He did go to the [facility] on February 24 with right axillary discomfort.  He was worried about his heart, symptoms sound musculoskeletal however.  He states that it hurt to move his right arm up and down, thought that he may have pulled a muscle.  Symptoms are getting better with a hot pad.  ECG from that visit showed sinus rhythm with PACs.  No acute ST segment changes.  No lab work for review.  He is following with Dr. Carlis Abbott with [facility] for management of PAD.  Recent peripheral Doppler studies indicated moderate stenosis involving the right SFA with patent common femoral to AK popliteal bypass on the left.  Angiography is pending.  LDL was 87 in November 2021.  We went over his medications, discussed increasing Lipitor to 80 mg daily.  Past Medical History:  Diagnosis Date  . Arthritis    RA  . Bronchitis   . Carotid artery disease ([place])   . Cervical spine disease    Neck surgery in 2007 with Dr. Joya Salm  . COPD (chronic obstructive pulmonary disease) ([place])   . Coronary atherosclerosis of native coronary artery    Nonobstrtuctive by catheterization 2007, 2013, 2021  . DVT (deep venous thrombosis) ([place])   . Essential hypertension   . GERD (gastroesophageal reflux disease)   . Hyperlipidemia   . PAD (peripheral artery disease) ([place])    s/p left fem-pop bypass 2007; PTA and stenting of right iliac artery 09/2011   . Rheumatoid arthritis(714.0)   . Type 2 diabetes mellitus ([place])     Past Surgical History:  Procedure Laterality Date  . ABDOMINAL AORTAGRAM N/A 10/03/2011   Procedure: ABDOMINAL Maxcine Ham;  Surgeon: Conrad Griswold, MD;  Location: [facility] CATH LAB;  Service: Cardiovascular;  Laterality: N/A;  . ABDOMINAL AORTOGRAM W/LOWER EXTREMITY N/A 01/16/2017   Procedure: ABDOMINAL AORTOGRAM W/LOWER EXTREMITY;  Surgeon: Conrad [place], MD;  Location: [place] CV LAB;  Service: Cardiovascular;  Laterality: N/A;  . ABDOMINAL AORTOGRAM W/LOWER EXTREMITY Bilateral 08/04/2019   Procedure: ABDOMINAL AORTOGRAM W/LOWER EXTREMITY;  Surgeon: Marty Heck, MD;  Location: [place] CV LAB;  Service: Cardiovascular;  Laterality: Bilateral;  . CERVICAL DISC SURGERY    . COLONOSCOPY WITH PROPOFOL N/A 02/17/2017   Procedure: COLONOSCOPY WITH PROPOFOL;  Surgeon: Daneil Dolin, MD;  [facility];  Service: Endoscopy;  Laterality: N/A;  2:45pm  . FEMORAL-POPLITEAL BYPASS GRAFT  11/06/2005  . ILIAC ARTERY STENT  10-03-11   Right CIA stenting  . LEFT HEART CATH AND CORONARY ANGIOGRAPHY N/A 04/14/2019   Procedure: LEFT HEART CATH AND CORONARY ANGIOGRAPHY;  Surgeon: Troy Sine, MD;  Location: [place] CV LAB;  Service: Cardiovascular;  Laterality: N/A;  . PERIPHERAL VASCULAR BALLOON ANGIOPLASTY Right 01/16/2017   Procedure: PERIPHERAL VASCULAR BALLOON ANGIOPLASTY;  Surgeon: Conrad [place], MD;  Location: [facility] [place] CV LAB;  Service: Cardiovascular;  Laterality: Right;  common iliac   . PR VEIN BYPASS GRAFT,AORTO-FEM-POP  06-22-2010   Redo Left Fem-Pop  . Repair of left arm fracture    . 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 SURGERY SCROTAL / TESTICULAR      Current Outpatient Medications  Medication Sig Dispense Refill  . aspirin EC 81 MG tablet Take 1 tablet (81 mg total) by mouth daily.    Marland Kitchen atorvastatin (LIPITOR) 80 MG tablet Take 1 tablet (80 mg total) by mouth daily. 90 tablet 1  . bismuth  subsalicylate (PEPTO BISMOL) 262 MG/15ML suspension Take 30 mLs by mouth every 6 (six) hours as needed for indigestion or diarrhea or loose stools.    . budesonide-formoterol (SYMBICORT) 160-4.5 MCG/ACT inhaler INHALE 2 PUFFS BY MOUTH TWICE A DAY RINSE MOUTH AFTER USE. (Patient taking differently: Inhale 2 puffs into the lungs in the morning and at bedtime.) 10.2 g 5  . cilostazol (PLETAL) 100 MG tablet TAKE 1 TABLET(100 MG) BY MOUTH TWICE DAILY (Patient taking differently: Take 100 mg by mouth 2 (two) times daily.) 60 tablet 5  . clopidogrel (PLAVIX) 75 MG tablet Take 1 tablet (75 mg total) by mouth daily. 30 tablet 11  . Dexlansoprazole (DEXILANT) 30 MG capsule Take 30- 60 min before your first and last meals of the day (Patient taking differently: Take 30 mg by mouth daily.) 60 capsule 11  . diclofenac Sodium (VOLTAREN) 1 % GEL Apply 2 g topically 4 (four) times daily as needed (Pain).    Marland Kitchen docusate sodium (COLACE) 100 MG capsule Take 100 mg by mouth daily as needed for mild constipation or moderate constipation.     Marland Kitchen HYDROcodone-acetaminophen (NORCO) 7.5-325 MG per tablet Take 1 tablet by mouth 2 (two) times daily as needed for moderate pain.    Marland Kitchen ibuprofen (ADVIL) 200 MG tablet Take 400 mg by mouth every 6 (six) hours as needed for headache or moderate pain.    . isosorbide mononitrate (IMDUR) 30 MG 24 hr tablet Take 1 tablet (30 mg total) by mouth daily. 30 tablet 6  . JANUVIA 100 MG tablet Take 100 mg by mouth daily.    Marland Kitchen JARDIANCE 25 MG TABS tablet Take 25 mg by mouth daily.    Marland Kitchen losartan (COZAAR) 25 MG tablet Take 25 mg by mouth daily.    . metFORMIN (GLUCOPHAGE) 1000 MG tablet Take 1,000 mg by mouth 2 (two) times daily with a meal.    . Misc Natural Products (TURMERIC CURCUMIN) CAPS Take 1 capsule by mouth daily as needed (pain).    . Multiple Vitamins-Minerals (AIRBORNE PO) Take 1 tablet by mouth daily.    . nitroGLYCERIN (NITROSTAT) 0.4 MG SL tablet Place 1 tablet (0.4 mg total) under  the tongue every 5 (five) minutes as needed for chest pain. 25 tablet 3  . ONE TOUCH ULTRA TEST test strip     . OneTouch Delica Lancets 34K MISC Apply topically.    Marland Kitchen PROAIR HFA 108 (90 BASE) MCG/ACT inhaler Inhale 2 puffs into the lungs every 6 (six) hours as needed for wheezing or shortness of breath.     . sildenafil (REVATIO) 20 MG tablet Take 40-60 mg by mouth daily as needed for erectile dysfunction.    . Tiotropium Bromide Monohydrate (SPIRIVA RESPIMAT) 2.5 MCG/ACT AERS Inhale 2 puffs into the lungs daily. 4 g 11  . XELJANZ 5 MG TABS TAKE 1 TABLET BY MOUTH DAILY (Patient taking differently: Take 5 mg by mouth  daily.) 90 tablet 0   No current facility-administered medications for this visit.   Allergies:  Patient has no known allergies.   ROS: Claudication, predominantly right-sided.  No syncope.  Physical Exam: VS:  BP 116/60   Pulse 98   Ht 5\' 9"  (1.753 m)   Wt 169 lb (76.7 kg)   SpO2 95%   BMI 24.96 kg/m , BMI Body mass index is 24.96 kg/m.  Wt Readings from Last 3 Encounters:  03/06/20 169 lb (76.7 kg)  02/22/20 165 lb (74.8 kg)  01/27/20 170 lb (77.1 kg)    General: Patient appears comfortable at rest. HEENT: Conjunctiva and lids normal, wearing a mask. Neck: Supple, no elevated JVP or carotid bruits, no thyromegaly. Lungs: Clear to auscultation, nonlabored breathing at rest. Cardiac: Regular rate and rhythm, no S3 or significant systolic murmur, no pericardial rub. Extremities: No pitting edema.  DPs not palpable.  ECG:  An ECG dated 04/13/2019 was personally reviewed today and demonstrated:  Sinus rhythm with nonspecific T wave changes, low voltage in the limb leads.  Recent Labwork: 01/27/2020: ALT 17; AST 17; BUN 21; Creat 1.06; Hemoglobin 13.5; Platelets 326; Potassium 4.5; Sodium 137     Component Value Date/Time   CHOL 160 11/09/2019 1032   TRIG 59 11/09/2019 1032   HDL 61 11/09/2019 1032   CHOLHDL 2.9 01/15/2019 1620   CHOLHDL 2.7 08/06/2018 1035    LDLCALC 87 11/09/2019 1032   [place] 88 08/06/2018 1035    Other Studies Reviewed Today:  Cardiac catheterization 04/14/2019:  Prox LAD to Mid LAD lesion is 20% stenosed.  Mid LAD lesion is 30% stenosed.  Prox RCA lesion is 30% stenosed.  Mild non-obstructive CAD with 20 and 30% irregularities in the proximal to mid LAD; normal ramus intermediate and left circumflex coronary arteries; and RCA with smooth 30% proximal narrowing with distal vessel tortuosity.  Low normal global LV function with EF estimated at 50%; LVEDP 14 mmHg.  Assessment and Plan:  1.  Nonobstructive CAD by history, cardiac catheterization from April 2021 noted above.  He reports no obvious angina at this time.  Continue regular exercise plan including walking regimen.  He is currently on aspirin, Lipitor, Imdur, losartan, and has as needed nitroglycerin available.  2.  Mixed hyperlipidemia, increase Lipitor to 80 mg daily and recheck FLP in 6 months.  Most recent LDL 87.  3.  PAD with previous left-sided common femoral to popliteal bypass, moderate right SFA stenosis with pending angiography.  He follows with Dr. Carlis Abbott with [facility].  Continue aspirin and statin.  4.  Essential hypertension, blood pressure is normal today on current regimen.  No changes are made.  Medication Adjustments/Labs and Tests Ordered: Current medicines are reviewed at length with the patient today.  Concerns regarding medicines are outlined above.   Tests Ordered: Orders Placed This Encounter  Procedures  . Lipid panel    Medication Changes: Meds ordered this encounter  Medications  . atorvastatin (LIPITOR) 80 MG tablet    Sig: Take 1 tablet (80 mg total) by mouth daily.    Dispense:  90 tablet    Refill:  1    DOSE INCREASE 03/06/2020    Disposition:  Follow up 6 months in the [place] office.  Signed, Satira Sark, MD, [facility] 03/06/2020 8:47 AM    [facility] [place], [place], [place]  [address] Phone: [phone]; Fax: [phone]

## 2020-03-06 ENCOUNTER — Ambulatory Visit (INDEPENDENT_AMBULATORY_CARE_PROVIDER_SITE_OTHER): Payer: Medicare Other | Admitting: Cardiology

## 2020-03-06 ENCOUNTER — Encounter: Payer: Self-pay | Admitting: Cardiology

## 2020-03-06 VITALS — BP 116/60 | HR 98 | Ht 69.0 in | Wt 169.0 lb

## 2020-03-06 DIAGNOSIS — I10 Essential (primary) hypertension: Secondary | ICD-10-CM | POA: Diagnosis not present

## 2020-03-06 DIAGNOSIS — I25119 Atherosclerotic heart disease of native coronary artery with unspecified angina pectoris: Secondary | ICD-10-CM

## 2020-03-06 DIAGNOSIS — I739 Peripheral vascular disease, unspecified: Secondary | ICD-10-CM

## 2020-03-06 DIAGNOSIS — E782 Mixed hyperlipidemia: Secondary | ICD-10-CM | POA: Diagnosis not present

## 2020-03-06 DIAGNOSIS — E1165 Type 2 diabetes mellitus with hyperglycemia: Secondary | ICD-10-CM | POA: Diagnosis not present

## 2020-03-06 MED ORDER — ATORVASTATIN CALCIUM 80 MG PO TABS: 80.0000 mg | ORAL_TABLET | Freq: Every day | ORAL | 1 refills | Status: DC

## 2020-03-06 NOTE — Patient Instructions (Signed)
Your physician recommends that you schedule a follow-up appointment in: [place] has recommended you make the following change in your medication:   INCREASE ATORVASTATIN 80 MG DAILY   Your physician recommends that you return for lab work in: [place] FAST 6-8 HOURS PRIOR TO LAB WORK  Thank you for choosing [facility]!!

## 2020-03-08 ENCOUNTER — Other Ambulatory Visit (HOSPITAL_COMMUNITY)
Admission: RE | Admit: 2020-03-08 | Discharge: 2020-03-08 | Disposition: A | Discharge status: Home / Self Care | Payer: Medicare Other | Source: Ambulatory Visit | Attending: Vascular Surgery | Admitting: Vascular Surgery

## 2020-03-08 DIAGNOSIS — Z01812 Encounter for preprocedural laboratory examination: Secondary | ICD-10-CM | POA: Insufficient documentation

## 2020-03-08 DIAGNOSIS — Z20822 Contact with and (suspected) exposure to covid-19: Secondary | ICD-10-CM | POA: Diagnosis not present

## 2020-03-08 LAB — SARS CORONAVIRUS 2 (TAT 6-24 HRS): SARS Coronavirus 2: NEGATIVE

## 2020-03-09 ENCOUNTER — Encounter (HOSPITAL_COMMUNITY): Payer: Medicare Other

## 2020-03-09 ENCOUNTER — Encounter (HOSPITAL_COMMUNITY)
Admission: RE | Disposition: A | Discharge status: Home / Self Care | Payer: Self-pay | Source: Home / Self Care | Attending: Vascular Surgery

## 2020-03-09 ENCOUNTER — Other Ambulatory Visit: Payer: Self-pay

## 2020-03-09 ENCOUNTER — Ambulatory Visit (HOSPITAL_COMMUNITY)
Admission: RE | Admit: 2020-03-09 | Discharge: 2020-03-09 | Disposition: A | Discharge status: Home / Self Care | Payer: Medicare Other | Attending: Vascular Surgery | Admitting: Vascular Surgery

## 2020-03-09 ENCOUNTER — Ambulatory Visit: Payer: Medicare Other | Admitting: Vascular Surgery

## 2020-03-09 DIAGNOSIS — I10 Essential (primary) hypertension: Secondary | ICD-10-CM | POA: Diagnosis not present

## 2020-03-09 DIAGNOSIS — Z7982 Long term (current) use of aspirin: Secondary | ICD-10-CM | POA: Insufficient documentation

## 2020-03-09 DIAGNOSIS — I2510 Atherosclerotic heart disease of native coronary artery without angina pectoris: Secondary | ICD-10-CM | POA: Insufficient documentation

## 2020-03-09 DIAGNOSIS — I70235 Atherosclerosis of native arteries of right leg with ulceration of other part of foot: Secondary | ICD-10-CM | POA: Diagnosis not present

## 2020-03-09 DIAGNOSIS — Z7951 Long term (current) use of inhaled steroids: Secondary | ICD-10-CM | POA: Diagnosis not present

## 2020-03-09 DIAGNOSIS — E1151 Type 2 diabetes mellitus with diabetic peripheral angiopathy without gangrene: Secondary | ICD-10-CM | POA: Insufficient documentation

## 2020-03-09 DIAGNOSIS — M069 Rheumatoid arthritis, unspecified: Secondary | ICD-10-CM | POA: Diagnosis not present

## 2020-03-09 DIAGNOSIS — L97519 Non-pressure chronic ulcer of other part of right foot with unspecified severity: Secondary | ICD-10-CM | POA: Insufficient documentation

## 2020-03-09 DIAGNOSIS — I779 Disorder of arteries and arterioles, unspecified: Secondary | ICD-10-CM | POA: Insufficient documentation

## 2020-03-09 DIAGNOSIS — Z89021 Acquired absence of right finger(s): Secondary | ICD-10-CM | POA: Insufficient documentation

## 2020-03-09 DIAGNOSIS — Z86718 Personal history of other venous thrombosis and embolism: Secondary | ICD-10-CM | POA: Diagnosis not present

## 2020-03-09 DIAGNOSIS — L608 Other nail disorders: Secondary | ICD-10-CM | POA: Insufficient documentation

## 2020-03-09 DIAGNOSIS — Z87891 Personal history of nicotine dependence: Secondary | ICD-10-CM | POA: Insufficient documentation

## 2020-03-09 DIAGNOSIS — E11621 Type 2 diabetes mellitus with foot ulcer: Secondary | ICD-10-CM | POA: Diagnosis not present

## 2020-03-09 DIAGNOSIS — Z79899 Other long term (current) drug therapy: Secondary | ICD-10-CM | POA: Diagnosis not present

## 2020-03-09 DIAGNOSIS — Z95828 Presence of other vascular implants and grafts: Secondary | ICD-10-CM | POA: Diagnosis not present

## 2020-03-09 DIAGNOSIS — E785 Hyperlipidemia, unspecified: Secondary | ICD-10-CM | POA: Diagnosis not present

## 2020-03-09 DIAGNOSIS — Z7984 Long term (current) use of oral hypoglycemic drugs: Secondary | ICD-10-CM | POA: Diagnosis not present

## 2020-03-09 DIAGNOSIS — Z7902 Long term (current) use of antithrombotics/antiplatelets: Secondary | ICD-10-CM | POA: Diagnosis not present

## 2020-03-09 DIAGNOSIS — Z833 Family history of diabetes mellitus: Secondary | ICD-10-CM | POA: Insufficient documentation

## 2020-03-09 DIAGNOSIS — Z8249 Family history of ischemic heart disease and other diseases of the circulatory system: Secondary | ICD-10-CM | POA: Diagnosis not present

## 2020-03-09 HISTORY — PX: ABDOMINAL AORTOGRAM W/LOWER EXTREMITY: CATH118223

## 2020-03-09 HISTORY — PX: PERIPHERAL VASCULAR BALLOON ANGIOPLASTY: CATH118281

## 2020-03-09 LAB — POCT I-STAT, CHEM 8
BUN: 18 mg/dL (ref 8–23)
Calcium, Ion: 1.3 mmol/L (ref 1.15–1.40)
Chloride: 104 mmol/L (ref 98–111)
Creatinine, Ser: 0.9 mg/dL (ref 0.61–1.24)
Glucose, Bld: 180 mg/dL — ABNORMAL HIGH (ref 70–99)
HCT: 40 % (ref 39.0–52.0)
Hemoglobin: 13.6 g/dL (ref 13.0–17.0)
Potassium: 4.1 mmol/L (ref 3.5–5.1)
Sodium: 141 mmol/L (ref 135–145)
TCO2: 23 mmol/L (ref 22–32)

## 2020-03-09 LAB — GLUCOSE, CAPILLARY
Glucose-Capillary: 108 mg/dL — ABNORMAL HIGH (ref 70–99)
Glucose-Capillary: 93 mg/dL (ref 70–99)

## 2020-03-09 LAB — POCT ACTIVATED CLOTTING TIME
Activated Clotting Time: 249 seconds
Activated Clotting Time: 184 seconds
Activated Clotting Time: 166 seconds

## 2020-03-09 SURGERY — ABDOMINAL AORTOGRAM W/LOWER EXTREMITY
Anesthesia: LOCAL

## 2020-03-09 MED ORDER — HEPARIN SODIUM (PORCINE) 1000 UNIT/ML IJ SOLN
INTRAMUSCULAR | Status: AC
  Filled 2020-03-09: qty 1

## 2020-03-09 MED ORDER — SODIUM CHLORIDE 0.9 % IV SOLN: INTRAVENOUS | Status: AC

## 2020-03-09 MED ORDER — SODIUM CHLORIDE 0.9% FLUSH: 3.0000 mL | Freq: Two times a day (BID) | INTRAVENOUS | Status: DC

## 2020-03-09 MED ORDER — LIDOCAINE HCL (PF) 1 % IJ SOLN
INTRAMUSCULAR | Status: AC
  Filled 2020-03-09: qty 30

## 2020-03-09 MED ORDER — IODIXANOL 320 MG/ML IV SOLN
INTRAVENOUS | Status: DC | PRN
  Administered 2020-03-09: 140 mL via INTRA_ARTERIAL

## 2020-03-09 MED ORDER — HEPARIN (PORCINE) IN NACL 1000-0.9 UT/500ML-% IV SOLN
INTRAVENOUS | Status: AC
  Filled 2020-03-09: qty 1000

## 2020-03-09 MED ORDER — LABETALOL HCL 5 MG/ML IV SOLN: 10.0000 mg | INTRAVENOUS | Status: DC | PRN

## 2020-03-09 MED ORDER — LIDOCAINE HCL (PF) 1 % IJ SOLN
INTRAMUSCULAR | Status: DC | PRN
  Administered 2020-03-09: 15 mL via INTRADERMAL

## 2020-03-09 MED ORDER — HEPARIN (PORCINE) IN NACL 1000-0.9 UT/500ML-% IV SOLN
INTRAVENOUS | Status: DC | PRN
  Administered 2020-03-09 (×2): 500 mL

## 2020-03-09 MED ORDER — HYDRALAZINE HCL 20 MG/ML IJ SOLN: 5.0000 mg | INTRAMUSCULAR | Status: DC | PRN

## 2020-03-09 MED ORDER — HYDRALAZINE HCL 20 MG/ML IJ SOLN
INTRAMUSCULAR | Status: AC
  Filled 2020-03-09: qty 1

## 2020-03-09 MED ORDER — SODIUM CHLORIDE 0.9 % IV SOLN: INTRAVENOUS | Status: DC

## 2020-03-09 MED ORDER — ACETAMINOPHEN 325 MG PO TABS: 650.0000 mg | ORAL_TABLET | ORAL | Status: DC | PRN

## 2020-03-09 MED ORDER — SODIUM CHLORIDE 0.9% FLUSH: 3.0000 mL | INTRAVENOUS | Status: DC | PRN

## 2020-03-09 MED ORDER — ONDANSETRON HCL 4 MG/2ML IJ SOLN: 4.0000 mg | Freq: Four times a day (QID) | INTRAMUSCULAR | Status: DC | PRN

## 2020-03-09 MED ORDER — HEPARIN SODIUM (PORCINE) 1000 UNIT/ML IJ SOLN
INTRAMUSCULAR | Status: DC | PRN
  Administered 2020-03-09: 8000 [IU] via INTRAVENOUS

## 2020-03-09 MED ORDER — SODIUM CHLORIDE 0.9 % IV SOLN: 250.0000 mL | INTRAVENOUS | Status: DC | PRN

## 2020-03-09 SURGICAL SUPPLY — 21 items
BALLN STERLING OTW 3X40X150 (BALLOONS) ×3
BALLOON STERLING OTW 3X40X150 (BALLOONS) IMPLANT
CATH CXI SUPP 2.6F 150 ANG (CATHETERS) ×1 IMPLANT
CATH OMNI FLUSH 5F 65CM (CATHETERS) ×1 IMPLANT
DCB RANGER 5.0X40 135 (BALLOONS) IMPLANT
KIT ENCORE 26 ADVANTAGE (KITS) ×1 IMPLANT
KIT MICROPUNCTURE NIT STIFF (SHEATH) ×1 IMPLANT
KIT PV (KITS) ×3 IMPLANT
RANGER DCB 5.0X40 135 (BALLOONS) ×3
SHEATH FLEX ANSEL ANG 5F 45CM (SHEATH) ×1 IMPLANT
SHEATH PINNACLE 5F 10CM (SHEATH) ×1 IMPLANT
SHEATH PINNACLE 6F 10CM (SHEATH) ×1 IMPLANT
SHEATH PROBE COVER 6X72 (BAG) ×1 IMPLANT
STOPCOCK MORSE 400PSI 3WAY (MISCELLANEOUS) ×1 IMPLANT
SYR MEDRAD MARK 7 150ML (SYRINGE) ×3 IMPLANT
TRANSDUCER W/STOPCOCK (MISCELLANEOUS) ×3 IMPLANT
TRAY PV CATH (CUSTOM PROCEDURE TRAY) ×3 IMPLANT
TUBING CIL FLEX 10 FLL-RA (TUBING) ×1 IMPLANT
WIRE G V18X300CM (WIRE) ×1 IMPLANT
WIRE ROSEN-J .035X180CM (WIRE) ×1 IMPLANT
WIRE STARTER BENTSON 035X150 (WIRE) ×1 IMPLANT

## 2020-03-09 NOTE — H&P (Signed)
History and Physical Interval Note:  03/09/2020 7:25 AM  [name]  has presented today for surgery, with the diagnosis of Pad.  The various methods of treatment have been discussed with the patient and family. After consideration of risks, benefits and other options for treatment, the patient has consented to  Procedure(s): ABDOMINAL AORTOGRAM W/LOWER EXTREMITY (N/A) as a surgical intervention.  The patient's history has been reviewed, patient examined, no change in status, stable for surgery.  I have reviewed the patient's chart and labs.  Questions were answered to the patient's satisfaction.    Aortogram, right leg arteriogram, possible intervention.  [name]  Patient name: [name] MRN: [id number]        DOB: 1950-09-24        Sex: male  REASON FOR CONSULT: Right third toe ulcer  HPI: [name] is a 70 y.o. male, with history of diabetes, PAD, hypertension, CAD, COPD that presents for evaluation of a new ulcer and discoloration of the right third toenail.  The referral states it is for a new left ulcer but the patient states it is his right leg.  He states he has had some numbness in the second third toes for the last several months.  This does not keep him awake at night and he is able to sleep fine.  He has been soaking his foot and states the area in question on the third toe has been healing very nicely.  He has had a previous left femoral to above-knee pop bypass with peroneal runoff in 2007 by Dr. Kellie Simmering.  Also a previous right common iliac artery stent.  I previously performed arteriogram on him last year in 2021.      Past Medical History:  Diagnosis Date  . Arthritis    RA  . Bronchitis   . Carotid artery disease ([place])   . Cervical spine disease    Neck surgery in 2007 with Dr. Joya Salm  . COPD (chronic obstructive pulmonary disease) ([place])   . Coronary atherosclerosis of native coronary artery    Nonobstrtuctive by catheterization 2007,  2013, 2021  . DVT (deep venous thrombosis) ([place])   . Essential hypertension   . GERD (gastroesophageal reflux disease)   . Hyperlipidemia   . PAD (peripheral artery disease) ([place])    s/p left fem-pop bypass 2007; PTA and stenting of right iliac artery 09/2011  . Rheumatoid arthritis(714.0)   . Type 2 diabetes mellitus ([place])          Past Surgical History:  Procedure Laterality Date  . ABDOMINAL AORTAGRAM N/A 10/03/2011   Procedure: ABDOMINAL Maxcine Ham;  Surgeon: Conrad [place], MD;  Location: [facility] CATH LAB;  Service: Cardiovascular;  Laterality: N/A;  . ABDOMINAL AORTOGRAM W/LOWER EXTREMITY N/A 01/16/2017   Procedure: ABDOMINAL AORTOGRAM W/LOWER EXTREMITY;  Surgeon: Conrad [place], MD;  Location: [place] CV LAB;  Service: Cardiovascular;  Laterality: N/A;  . ABDOMINAL AORTOGRAM W/LOWER EXTREMITY Bilateral 08/04/2019   Procedure: ABDOMINAL AORTOGRAM W/LOWER EXTREMITY;  Surgeon: Brett Heck, MD;  Location: [place] CV LAB;  Service: Cardiovascular;  Laterality: Bilateral;  . CERVICAL DISC SURGERY    . COLONOSCOPY WITH PROPOFOL N/A 02/17/2017   Procedure: COLONOSCOPY WITH PROPOFOL;  Surgeon: Daneil Dolin, MD;  [facility];  Service: Endoscopy;  Laterality: N/A;  2:45pm  . FEMORAL-POPLITEAL BYPASS GRAFT  11/06/2005  . ILIAC ARTERY STENT  10-03-11   Right CIA stenting  . LEFT HEART CATH AND CORONARY ANGIOGRAPHY N/A 04/14/2019  Procedure: LEFT HEART CATH AND CORONARY ANGIOGRAPHY;  Surgeon: Troy Sine, MD;  Location: [place] CV LAB;  Service: Cardiovascular;  Laterality: N/A;  . PERIPHERAL VASCULAR BALLOON ANGIOPLASTY Right 01/16/2017   Procedure: PERIPHERAL VASCULAR BALLOON ANGIOPLASTY;  Surgeon: Conrad [place], MD;  Location: [place] CV LAB;  Service: Cardiovascular;  Laterality: Right;  common iliac   . PR VEIN BYPASS GRAFT,AORTO-FEM-POP  06-22-2010   Redo Left Fem-Pop  . Repair of left arm fracture    . 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 SURGERY SCROTAL / TESTICULAR           Family History  Problem Relation Age of Onset  . Heart attack Mother 37  . Deep vein thrombosis Mother   . Diabetes Mother   . Hyperlipidemia Mother   . Hypertension Mother   . Peripheral vascular disease Mother   . Stroke Father   . Diabetes Father   . Hyperlipidemia Father   . Hypertension Father   . Peripheral vascular disease Father   . Diabetes Sister   . Hyperlipidemia Sister   . Hypertension Sister   . Diabetes Brother   . Hyperlipidemia Brother   . Cancer Brother   . Deep vein thrombosis Brother   . Hypertension Brother   . Peripheral vascular disease Brother   . Hyperlipidemia Son   . Cancer Son   . Hypertension Son   . Colon cancer Neg Hx     SOCIAL HISTORY: Social History        Socioeconomic History  . Marital status: Single    Spouse name: Not on file  . Number of children: Not on file  . Years of education: Not on file  . Highest education level: Not on file  Occupational History  . Not on file  Tobacco Use  . Smoking status: Former Smoker    Packs/day: 1.00    Years: 45.00    Pack years: 45.00    Types: Cigarettes, Cigars    Quit date: 09/01/2011    Years since quitting: 8.4  . Smokeless tobacco: Never Used  Vaping Use  . Vaping Use: Never used  Substance and Sexual Activity  . Alcohol use: No    Alcohol/week: 0.0 standard drinks  . Drug use: No  . Sexual activity: Not Currently    Birth control/protection: None  Other Topics Concern  . Not on file  Social History Narrative   The patient lives in [place].    Social Determinants of Health   Financial Resource Strain: Not on file  Food Insecurity: Not on file  Transportation Needs: Not on file  Physical Activity: Not on file  Stress: Not on file  Social Connections: Not on file  Intimate Partner Violence: Not on file    No Known Allergies        Current Outpatient Medications   Medication Sig Dispense Refill  . aspirin EC 81 MG tablet Take 1 tablet (81 mg total) by mouth daily.    . budesonide-formoterol (SYMBICORT) 160-4.5 MCG/ACT inhaler INHALE 2 PUFFS BY MOUTH TWICE A DAY RINSE MOUTH AFTER USE. (Patient taking differently: Inhale 2 puffs into the lungs in the morning and at bedtime.) 10.2 g 5  . cilostazol (PLETAL) 100 MG tablet TAKE 1 TABLET(100 MG) BY MOUTH TWICE DAILY 60 tablet 5  . clopidogrel (PLAVIX) 75 MG tablet Take 1 tablet (75 mg total) by mouth daily. 30 tablet 11  . Dexlansoprazole (DEXILANT) 30 MG capsule Take 30- 60 min  before your first and last meals of the day (Patient taking differently: Take 30 mg by mouth daily.) 60 capsule 11  . diclofenac Sodium (VOLTAREN) 1 % GEL Apply 2 g topically at bedtime as needed (Pain).     Marland Kitchen docusate sodium (COLACE) 100 MG capsule Take 100 mg by mouth daily as needed for mild constipation or moderate constipation.     Marland Kitchen HYDROcodone-acetaminophen (NORCO) 7.5-325 MG per tablet Take 1 tablet by mouth 2 (two) times daily as needed for moderate pain.    Marland Kitchen ibuprofen (ADVIL) 200 MG tablet Take 400 mg by mouth in the morning and at bedtime.    . isosorbide mononitrate (IMDUR) 30 MG 24 hr tablet Take 1 tablet (30 mg total) by mouth daily. 30 tablet 6  . JANUVIA 100 MG tablet Take 100 mg by mouth daily.    Marland Kitchen JARDIANCE 25 MG TABS tablet Take 25 mg by mouth daily.    . metFORMIN (GLUCOPHAGE) 1000 MG tablet Take 500 mg by mouth 2 (two) times daily with a meal.     . nitroGLYCERIN (NITROSTAT) 0.4 MG SL tablet Place 1 tablet (0.4 mg total) under the tongue every 5 (five) minutes as needed for chest pain. 25 tablet 3  . ONE TOUCH ULTRA TEST test strip     . OneTouch Delica Lancets 30Q MISC Apply topically.    Marland Kitchen PROAIR HFA 108 (90 BASE) MCG/ACT inhaler Inhale 2 puffs into the lungs every 6 (six) hours as needed for wheezing or shortness of breath.     . Tiotropium Bromide Monohydrate (SPIRIVA RESPIMAT) 2.5  MCG/ACT AERS Inhale 2 puffs into the lungs daily. 4 g 11  . TURMERIC CURCUMIN PO Take 1,500 mg by mouth daily.    . vitamin C (ASCORBIC ACID) 500 MG tablet Take 500 mg by mouth daily.    Morrie Sheldon 5 MG TABS TAKE 1 TABLET BY MOUTH DAILY 90 tablet 0   No current facility-administered medications for this visit.    REVIEW OF SYSTEMS:  [X]  denotes positive finding, [ ]  denotes negative finding Cardiac  Comments:  Chest pain or chest pressure:    Shortness of breath upon exertion:    Short of breath when lying flat:    Irregular heart rhythm:        Vascular    Pain in calf, thigh, or hip brought on by ambulation:    Pain in feet at night that wakes you up from your sleep:     Blood clot in your veins:    Leg swelling:         Pulmonary    Oxygen at home:    Productive cough:     Wheezing:         Neurologic    Sudden weakness in arms or legs:     Sudden numbness in arms or legs:     Sudden onset of difficulty speaking or slurred speech:    Temporary loss of vision in one eye:     Problems with dizziness:         Gastrointestinal    Blood in stool:     Vomited blood:         Genitourinary    Burning when urinating:     Blood in urine:        Psychiatric    Major depression:         Hematologic    Bleeding problems:    Problems with blood clotting too easily:  Skin    Rashes or ulcers:        Constitutional    Fever or chills:      PHYSICAL EXAM:    Vitals:   02/22/20 0841  BP: (!) 156/79  Pulse: (!) 58  Resp: 16  Temp: 97.7 F (36.5 C)  TempSrc: Temporal  SpO2: 96%  Weight: 165 lb (74.8 kg)  Height: 5\' 10"  (1.778 m)    GENERAL: The patient is a well-nourished male, in no acute distress. The vital signs are documented above. CARDIAC: There is a regular rate and rhythm.  VASCULAR:  Femoral pulses are palpable bilaterally No palpable  pedal pulses, right third toe pictured below PULMONARY: No respiratory distress. ABDOMEN: Soft and non-tender. MUSCULOSKELETAL: There are no major deformities or cyanosis. NEUROLOGIC: No focal weakness or paresthesias are detected. SKIN: There are no ulcers or rashes noted. PSYCHIATRIC: The patient has a normal affect.      DATA:   Left leg duplex shows a patent common femoral to AK popliteal artery bypass with no stenosis  Right leg duplex shows a moderate stenosis in the 50 to 75% range in the SFA with known tibial disease  ABI's non-compressible  Assessment/Plan:  70 year old male that presents for evaluation of a right third toe ulcer per the patient.  As pictured above this is not very impressive and he states this is healing after he has been soaking it.  I do not actually appreciate an open wound on exam just some discoloration to the toenail.  He does have moderate SFA stenosis on the right with known tibial disease.  Discussed that if he has progression of his wound would need right leg arteriogram and reevaluation for intervention.  Given he feels this is improving, we have decided to be more conservative.  I will see him again in 1 month for wound check.  I do not think he has any severe rest pain that would warrant intervention at this time   Brett Heck, MD [facility]: [phone]

## 2020-03-09 NOTE — Discharge Instructions (Signed)
NO METFORMIN/ GLUCOPHAGE FOR 2 DAYS     Femoral Site Care  This sheet gives you information about how to care for yourself after your procedure. Your health care provider may also give you more specific instructions. If you have problems or questions, contact your health care provider. What can I expect after the procedure? After the procedure, it is common to have:  Bruising that usually fades within 1-2 weeks.  Tenderness at the site. Follow these instructions at home: Wound care  Follow instructions from your health care provider about how to take care of your insertion site. Make sure you: ? Wash your hands with soap and water before you change your bandage (dressing). If soap and water are not available, use hand sanitizer. ? Change your dressing as told by your health care provider. ? Leave stitches (sutures), skin glue, or adhesive strips in place. These skin closures may need to stay in place for 2 weeks or longer. If adhesive strip edges start to loosen and curl up, you may trim the loose edges. Do not remove adhesive strips completely unless your health care provider tells you to do that.  Do not take baths, swim, or use a hot tub until your health care provider approves.  You may shower 24-48 hours after the procedure or as told by your health care provider. ? Gently wash the site with plain soap and water. ? Pat the area dry with a clean towel. ? Do not rub the site. This may cause bleeding.  Do not apply powder or lotion to the site. Keep the site clean and dry.  Check your femoral site every day for signs of infection. Check for: ? Redness, swelling, or pain. ? Fluid or blood. ? Warmth. ? Pus or a bad smell. Activity  For the first 2-3 days after your procedure, or as long as directed: ? Avoid climbing stairs as much as possible. ? Do not squat.  Do not lift anything that is heavier than 10 lb (4.5 kg), or the limit that you are told, until your health care  provider says that it is safe.  Rest as directed. ? Avoid sitting for a long time without moving. Get up to take short walks every 1-2 hours.  Do not drive for 24 hours if you were given a medicine to help you relax (sedative). General instructions  Take over-the-counter and prescription medicines only as told by your health care provider.  Keep all follow-up visits as told by your health care provider. This is important. Contact a health care provider if you have:  A fever or chills.  You have redness, swelling, or pain around your insertion site. Get help right away if:  The catheter insertion area swells very fast.  You pass out.  You suddenly start to sweat or your skin gets clammy.  The catheter insertion area is bleeding, and the bleeding does not stop when you hold steady pressure on the area.  The area near or just beyond the catheter insertion site becomes pale, cool, tingly, or numb. These symptoms may represent a serious problem that is an emergency. Do not wait to see if the symptoms will go away. Get medical help right away. Call your local emergency services (911 in the [place] Do not drive yourself to the hospital. Summary  After the procedure, it is common to have bruising that usually fades within 1-2 weeks.  Check your femoral site every day for signs of infection.  Do not lift   anything that is heavier than 10 lb (4.5 kg), or the limit that you are told, until your health care provider says that it is safe. This information is not intended to replace advice given to you by your health care provider. Make sure you discuss any questions you have with your health care provider. Document Revised: 08/27/2019 Document Reviewed: 08/27/2019 Elsevier Patient Education  2021 Elsevier Inc.  

## 2020-03-09 NOTE — Progress Notes (Signed)
SITE AREA:  Left groin/femoral  SITE PRIOR TO REMOVAL:  LEVEL 0  PRESSURE APPLIED FOR: approximately 20 minutes  MANUAL: YES  PATIENT STATUS DURING PULL: wnl  POST PULL SITE:  LEVEL 0, previous scar noted to left groin area  POST PULL INSTRUCTIONS GIVEN: YES  POST PULL PULSES PRESENT: yes, bilateral pedal pulses at +1  DRESSING APPLIED: gauze with tegaderm  BEDREST BEGINS @ 1155  COMMENTS:

## 2020-03-09 NOTE — Op Note (Signed)
Patient name: [name] MRN: [id number] DOB: November 09, 1950 Sex: male  03/09/2020 Pre-operative Diagnosis: PAD with right lower extremity toe discoloration and small ulcer Post-operative diagnosis:  Same Surgeon:  Marty Heck, MD Procedure Performed: 1.  Ultrasound-guided access of left common femoral artery 2.  Aortogram including catheter selection of aorta 3.  Right lower extremity arteriogram with selection of third order branches 4.  Right mid SFA angioplasty with drug-coated balloon (5 mm x 40 mm drug-coated Ranger) 5.  Right proximal anterior tibial artery angioplasty (3 mm x 40 mm Sterling)  Indications: Patient is a 70 year old male with known peripheral arterial disease who was recently seen in the office for follow-up describing a new right third toe discoloration with some numbness.  We initially recommended conservative management but then he had progressive symptoms and contacted our office.  He presents today for right lower extremity arteriogram after risk benefits discussed.  Findings:   Aortogram showed a patent infrarenal aorta with a patent right common iliac artery stent.  No flow limiting stenosis in aortoiliac segments.  The right common femoral and profunda was patent.  The mid SFA had about a 60% stenosis.  The above below-knee popliteal artery was patent with no flow-limiting stenosis.  Two-vessel runoff in anterior tibial and peroneal.  Anterior tibial with a high grade >80% stenosis proximally.  The posterior tibial appears occluded.  Ultimately the SFA focal stenosis was treated with a 5 mm x 40 mm drug-coated Ranger and the proximal right AT was treated with a 3 mm x 40 mm Sterling with good results.   Procedure:  The patient was identified in the holding area and taken to room 8.  The patient was then placed supine on the table and prepped and draped in the usual sterile fashion.  A time out was called.  Ultrasound was used to evaluate the left common  femoral artery.  It was patent .  A digital ultrasound image was acquired.  A micropuncture needle was used to access the left common femoral artery under ultrasound guidance.  An 018 wire was advanced without resistance and a micropuncture sheath was placed.  The 018 wire was removed and a benson wire was placed.  The micropuncture sheath was exchanged for a 5 french sheath.  An omniflush catheter was advanced over the wire to the level of L-1.  An abdominal angiogram was obtained.  We did pull the catheter down and did another aortogram to better visualize the right common iliac stent given the overlying bowel gas made visualization difficult.  The stent was widely patent.  I then used a Bentson wire with an Omni Flush catheter to cross the aortic bifurcation and crossed the right common iliac stent and ultimately placed a catheter into the right common femoral.  We then got staged imaging of the right lower extremity.  After evaluating images elected to initially intervene on the right SFA stenosis about 60%.  I used a Biomedical scientist and exchanged for a 5 [place] long Ansell sheath in the left groin over the aortic bifurcation into the right common femoral.  Patient was given 100 units/kg heparin IV.  I then used a V 18 wire to cross the right SFA lesion and this was angioplastied with a 5 mm x 40 mm drug-coated Ranger to nominal pressure for 3 minutes.  We had no dissection with no residual stenosis.  I then could better visualize the right AT stenosis at a different angle as we were imaging  the SFA after intervention.  I elected to intervene on this and ultimately used a angled CXI catheter with a V 18 to cross the proximal anterior tibial stenosis and then the proximal AT was angioplastied with a 3 mm x 40 mm Sterling to nominal pressure for 2 minutes.  Final injection did show some residual stenosis so I elected to treat this a second time at higher inflation pressure for 2 minutes with the 3 mm angioplasty  baloon.  No dissection with preserved flow down the AT and no residual stenosis that appeared flow-limiting.  Wires and catheters were removed the sheath was pulled over the aortic bifurcation and he will be taken holding the have the sheath removed from the left groin.   Plan: Patient will continue dual antiplatelet therapy.  Will arrange follow-up in 1 month with right leg arterial duplex and ABIs  Marty Heck, MD [facility] Office: [phone]

## 2020-03-10 ENCOUNTER — Encounter (HOSPITAL_COMMUNITY): Payer: Self-pay | Admitting: Vascular Surgery

## 2020-03-21 ENCOUNTER — Ambulatory Visit: Payer: Medicare Other | Admitting: Vascular Surgery

## 2020-03-27 DIAGNOSIS — I10 Essential (primary) hypertension: Secondary | ICD-10-CM | POA: Diagnosis not present

## 2020-03-27 DIAGNOSIS — Z299 Encounter for prophylactic measures, unspecified: Secondary | ICD-10-CM | POA: Diagnosis not present

## 2020-03-27 DIAGNOSIS — R634 Abnormal weight loss: Secondary | ICD-10-CM | POA: Diagnosis not present

## 2020-03-27 DIAGNOSIS — E1165 Type 2 diabetes mellitus with hyperglycemia: Secondary | ICD-10-CM | POA: Diagnosis not present

## 2020-04-03 ENCOUNTER — Other Ambulatory Visit: Payer: Self-pay

## 2020-04-03 DIAGNOSIS — I739 Peripheral vascular disease, unspecified: Secondary | ICD-10-CM

## 2020-04-06 DIAGNOSIS — E1165 Type 2 diabetes mellitus with hyperglycemia: Secondary | ICD-10-CM | POA: Diagnosis not present

## 2020-04-06 DIAGNOSIS — I10 Essential (primary) hypertension: Secondary | ICD-10-CM | POA: Diagnosis not present

## 2020-04-12 ENCOUNTER — Ambulatory Visit (INDEPENDENT_AMBULATORY_CARE_PROVIDER_SITE_OTHER): Payer: Medicare Other | Admitting: Physician Assistant

## 2020-04-12 ENCOUNTER — Ambulatory Visit (HOSPITAL_COMMUNITY)
Admission: RE | Admit: 2020-04-12 | Discharge: 2020-04-12 | Disposition: A | Discharge status: Home / Self Care | Payer: Medicare Other | Source: Ambulatory Visit | Attending: Vascular Surgery | Admitting: Vascular Surgery

## 2020-04-12 ENCOUNTER — Ambulatory Visit (INDEPENDENT_AMBULATORY_CARE_PROVIDER_SITE_OTHER)
Admit: 2020-04-12 | Discharge: 2020-04-12 | Disposition: A | Discharge status: Home / Self Care | Payer: Medicare Other | Attending: Vascular Surgery | Admitting: Vascular Surgery

## 2020-04-12 ENCOUNTER — Other Ambulatory Visit: Payer: Self-pay

## 2020-04-12 VITALS — BP 120/80 | HR 92 | Temp 97.9°F | Resp 20 | Ht 69.0 in | Wt 164.0 lb

## 2020-04-12 DIAGNOSIS — I739 Peripheral vascular disease, unspecified: Secondary | ICD-10-CM | POA: Diagnosis not present

## 2020-04-12 NOTE — Progress Notes (Signed)
Office Note     CC:  follow up Requesting Provider:  [name], MD  HPI: [name] is a 70 y.o. (1950-01-27) male who presents for follow up after Angiogram. On 03/09/20 he underwent Aortogram, right lower extremity arteriogram with angioplasty of right mid SFA with a DCB and angioplasty of the proximal right anterior tibial artery by Dr. Carlis Abbott. He was noted to have two vessel runoff via the AT and peroneal arteries. The PT was occluded. This was performed secondary to worsening pain in right lower extremity with 3rd toe discoloration and ulceration that was progressing.   He says since his procedure he is feeling okay. Still has some soreness in the right 3rd toe if he its it or if it is too compressed inside his shoes. Also says he intermittently has some sharp pains down anterior right leg. Not specifically on ambulation. He otherwise denies any pain on ambulation, or at rest. No non healing wounds. He walks 2 miles every morning. He has been compliant with his Aspirin, Plavix and Statin. He also takes pletal.  The pt is on a statin for cholesterol management.  The pt is on a daily aspirin.   Other AC:  Plavix The pt is on ARB for hypertension.   The pt is diabetic.   Tobacco hx:  Former smoker, quit 2013  Past Medical History:  Diagnosis Date  . Arthritis    RA  . Bronchitis   . Carotid artery disease ([place])   . Cervical spine disease    Neck surgery in 2007 with Dr. Joya Salm  . COPD (chronic obstructive pulmonary disease) ([place])   . Coronary atherosclerosis of native coronary artery    Nonobstrtuctive by catheterization 2007, 2013, 2021  . DVT (deep venous thrombosis) ([place])   . Essential hypertension   . GERD (gastroesophageal reflux disease)   . Hyperlipidemia   . PAD (peripheral artery disease) ([place])    s/p left fem-pop bypass 2007; PTA and stenting of right iliac artery 09/2011  . Rheumatoid arthritis(714.0)   . Type 2 diabetes mellitus ([place])     Past Surgical  History:  Procedure Laterality Date  . ABDOMINAL AORTAGRAM N/A 10/03/2011   Procedure: ABDOMINAL Maxcine Ham;  Surgeon: Conrad Muscoda, MD;  Location: [facility] CATH LAB;  Service: Cardiovascular;  Laterality: N/A;  . ABDOMINAL AORTOGRAM W/LOWER EXTREMITY N/A 01/16/2017   Procedure: ABDOMINAL AORTOGRAM W/LOWER EXTREMITY;  Surgeon: Conrad Hungerford, MD;  Location: [place] CV LAB;  Service: Cardiovascular;  Laterality: N/A;  . ABDOMINAL AORTOGRAM W/LOWER EXTREMITY Bilateral 08/04/2019   Procedure: ABDOMINAL AORTOGRAM W/LOWER EXTREMITY;  Surgeon: Marty Heck, MD;  Location: [facility] CV LAB;  Service: Cardiovascular;  Laterality: Bilateral;  . ABDOMINAL AORTOGRAM W/LOWER EXTREMITY N/A 03/09/2020   Procedure: ABDOMINAL AORTOGRAM W/LOWER EXTREMITY;  Surgeon: Marty Heck, MD;  Location: [place] CV LAB;  Service: Cardiovascular;  Laterality: N/A;  . CERVICAL DISC SURGERY    . COLONOSCOPY WITH PROPOFOL N/A 02/17/2017   Procedure: COLONOSCOPY WITH PROPOFOL;  Surgeon: Daneil Dolin, MD;  Location: [facility] ENDO SUITE;  Service: Endoscopy;  Laterality: N/A;  2:45pm  . FEMORAL-POPLITEAL BYPASS GRAFT  11/06/2005  . ILIAC ARTERY STENT  10-03-11   Right CIA stenting  . LEFT HEART CATH AND CORONARY ANGIOGRAPHY N/A 04/14/2019   Procedure: LEFT HEART CATH AND CORONARY ANGIOGRAPHY;  Surgeon: Troy Sine, MD;  Location: [place] CV LAB;  Service: Cardiovascular;  Laterality: N/A;  . PERIPHERAL VASCULAR BALLOON ANGIOPLASTY Right 01/16/2017  Procedure: PERIPHERAL VASCULAR BALLOON ANGIOPLASTY;  Surgeon: Conrad [place], MD;  Location: [facility] CV LAB;  Service: Cardiovascular;  Laterality: Right;  common iliac   . PERIPHERAL VASCULAR BALLOON ANGIOPLASTY  03/09/2020   Procedure: PERIPHERAL VASCULAR BALLOON ANGIOPLASTY;  Surgeon: Marty Heck, MD;  Location: [place] CV LAB;  Service: Cardiovascular;;  . PR VEIN BYPASS GRAFT,AORTO-FEM-POP  06-22-2010   Redo Left Fem-Pop  . Repair of left arm  fracture    . 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 SURGERY SCROTAL / TESTICULAR      Social History   Socioeconomic History  . Marital status: Single    Spouse name: Not on file  . Number of children: Not on file  . Years of education: Not on file  . Highest education level: Not on file  Occupational History  . Not on file  Tobacco Use  . Smoking status: Former Smoker    Packs/day: 1.00    Years: 45.00    Pack years: 45.00    Types: Cigarettes, Cigars    Quit date: 09/01/2011    Years since quitting: 8.6  . Smokeless tobacco: Never Used  Vaping Use  . Vaping Use: Never used  Substance and Sexual Activity  . Alcohol use: No    Alcohol/week: 0.0 standard drinks  . Drug use: No  . Sexual activity: Not Currently    Birth control/protection: None  Other Topics Concern  . Not on file  Social History Narrative   The patient lives in [place].    Social Determinants of Health   Financial Resource Strain: Not on file  Food Insecurity: Not on file  Transportation Needs: Not on file  Physical Activity: Not on file  Stress: Not on file  Social Connections: Not on file  Intimate Partner Violence: Not on file    Family History  Problem Relation Age of Onset  . Heart attack Mother 26  . Deep vein thrombosis Mother   . Diabetes Mother   . Hyperlipidemia Mother   . Hypertension Mother   . Peripheral vascular disease Mother   . Stroke Father   . Diabetes Father   . Hyperlipidemia Father   . Hypertension Father   . Peripheral vascular disease Father   . Diabetes Sister   . Hyperlipidemia Sister   . Hypertension Sister   . Diabetes Brother   . Hyperlipidemia Brother   . Cancer Brother   . Deep vein thrombosis Brother   . Hypertension Brother   . Peripheral vascular disease Brother   . Hyperlipidemia Son   . Cancer Son   . Hypertension Son   . Colon cancer Neg Hx     Current Outpatient Medications  Medication Sig Dispense Refill  . aspirin EC 81 MG tablet Take 1 tablet  (81 mg total) by mouth daily.    Marland Kitchen atorvastatin (LIPITOR) 80 MG tablet Take 1 tablet (80 mg total) by mouth daily. 90 tablet 1  . bismuth subsalicylate (PEPTO BISMOL) 262 MG/15ML suspension Take 30 mLs by mouth every 6 (six) hours as needed for indigestion or diarrhea or loose stools.    . budesonide-formoterol (SYMBICORT) 160-4.5 MCG/ACT inhaler INHALE 2 PUFFS BY MOUTH TWICE A DAY RINSE MOUTH AFTER USE. (Patient taking differently: Inhale 2 puffs into the lungs in the morning and at bedtime.) 10.2 g 5  . cilostazol (PLETAL) 100 MG tablet TAKE 1 TABLET(100 MG) BY MOUTH TWICE DAILY (Patient taking differently: Take 100 mg by mouth 2 (two) times daily.)  60 tablet 5  . clopidogrel (PLAVIX) 75 MG tablet Take 1 tablet (75 mg total) by mouth daily. 30 tablet 11  . Dexlansoprazole (DEXILANT) 30 MG capsule Take 30- 60 min before your first and last meals of the day (Patient taking differently: Take 30 mg by mouth daily.) 60 capsule 11  . diclofenac Sodium (VOLTAREN) 1 % GEL Apply 2 g topically 4 (four) times daily as needed (Pain).    Marland Kitchen docusate sodium (COLACE) 100 MG capsule Take 100 mg by mouth daily as needed for mild constipation or moderate constipation.     Marland Kitchen HYDROcodone-acetaminophen (NORCO) 7.5-325 MG per tablet Take 1 tablet by mouth 2 (two) times daily as needed for moderate pain.    Marland Kitchen ibuprofen (ADVIL) 200 MG tablet Take 400 mg by mouth every 6 (six) hours as needed for headache or moderate pain.    . isosorbide mononitrate (IMDUR) 30 MG 24 hr tablet Take 1 tablet (30 mg total) by mouth daily. 30 tablet 6  . JANUVIA 100 MG tablet Take 100 mg by mouth daily.    Marland Kitchen JARDIANCE 25 MG TABS tablet Take 25 mg by mouth daily.    Marland Kitchen losartan (COZAAR) 25 MG tablet Take 25 mg by mouth daily.    . metFORMIN (GLUCOPHAGE) 1000 MG tablet Take 1,000 mg by mouth 2 (two) times daily with a meal.    . Misc Natural Products (TURMERIC CURCUMIN) CAPS Take 1 capsule by mouth daily as needed (pain).    . Multiple  Vitamins-Minerals (AIRBORNE PO) Take 1 tablet by mouth daily.    . nitroGLYCERIN (NITROSTAT) 0.4 MG SL tablet Place 1 tablet (0.4 mg total) under the tongue every 5 (five) minutes as needed for chest pain. 25 tablet 3  . ONE TOUCH ULTRA TEST test strip     . OneTouch Delica Lancets 51O MISC Apply topically.    Marland Kitchen PROAIR HFA 108 (90 BASE) MCG/ACT inhaler Inhale 2 puffs into the lungs every 6 (six) hours as needed for wheezing or shortness of breath.     . sildenafil (REVATIO) 20 MG tablet Take 40-60 mg by mouth daily as needed for erectile dysfunction.    . Tiotropium Bromide Monohydrate (SPIRIVA RESPIMAT) 2.5 MCG/ACT AERS Inhale 2 puffs into the lungs daily. 4 g 11  . XELJANZ 5 MG TABS TAKE 1 TABLET BY MOUTH DAILY (Patient taking differently: Take 5 mg by mouth daily.) 90 tablet 0   No current facility-administered medications for this visit.    No Known Allergies   REVIEW OF SYSTEMS:  [X]  denotes positive finding, [ ]  denotes negative finding Cardiac  Comments:  Chest pain or chest pressure:    Shortness of breath upon exertion:    Short of breath when lying flat:    Irregular heart rhythm:        Vascular    Pain in calf, thigh, or hip brought on by ambulation:    Pain in feet at night that wakes you up from your sleep:     Blood clot in your veins:    Leg swelling:         Pulmonary    Oxygen at home:    Productive cough:     Wheezing:         Neurologic    Sudden weakness in arms or legs:     Sudden numbness in arms or legs:     Sudden onset of difficulty speaking or slurred speech:    Temporary loss of vision in  one eye:     Problems with dizziness:         Gastrointestinal    Blood in stool:     Vomited blood:         Genitourinary    Burning when urinating:     Blood in urine:        Psychiatric    Major depression:         Hematologic    Bleeding problems:    Problems with blood clotting too easily:        Skin    Rashes or ulcers:         Constitutional    Fever or chills:      PHYSICAL EXAMINATION:  Vitals:   04/12/20 1145  BP: 120/80  Pulse: 92  Resp: 20  Temp: 97.9 F (36.6 C)  TempSrc: Temporal  SpO2: 96%  Weight: 164 lb (74.4 kg)  Height: 5\' 9"  (1.753 m)    General:  WDWN in NAD; vital signs documented above Gait: Normal HENT: WNL, normocephalic Pulmonary: normal non-labored breathing , without wheezing Cardiac: regular HR, without  Murmurs Vascular Exam/Pulses: 2+ femoral pulses bilaterally, 2+ DP pulses bilaterally. Feet well perfused and warm Extremities: without ischemic changes, without Gangrene , without cellulitis; without open wounds; trophic nail changes to both feet. Right 3rd toe nail thickened and tender to palpation. No ulceration or darkening Musculoskeletal: no muscle wasting or atrophy  Neurologic: A&O X 3;  No focal weakness or paresthesias are detected Psychiatric:  The pt has Normal affect.   Non-Invasive Vascular Imaging:   +-------+-----------+-----------+----------------+------------+  ABI/TBIToday's ABIToday's TBIPrevious ABI  Previous TBI  +-------+-----------+-----------+----------------+------------+  Right 0.94    0.26    Non compressible0        +-------+-----------+-----------+----------------+------------+  Left  1.32    0.47    Calcified    0.55      +-------+-----------+-----------+----------------+------------+   Lower extremity arterial duplex BLE: Right: Near normal examination. Patent stent with no evidence of stenosis in the Right common iliac artery. No focal stenosis identified in the SFA.   Left: Patent left fem-pop bypass graft.   ASSESSMENT/PLAN:: 70 y.o. male here for follow up for Aortogram, right lower extremity arteriogram with angioplasty of right mid SFA with a DCB and angioplasty of the proximal right anterior tibial artery by Dr. Carlis Abbott on 03/09/20. This was for pain, discoloration and small ulceration of  right 3rd toe. Right 3rd toe has improved some. He does still have some soreness in the toe. No open wound. His duplex today shows patent right CIA stent, triphasic flow throughout SFA and pop and patent left lower extremity bypass graft with triphasic flow throughout. ABI overall stable, improved on the RLE. On exam his BLE are well perfused with palpable DP pulses bilaterally - I have encouraged him to continue his exercise regimen - Continue Aspirin, statin, Plavix and pletal - He will follow up in 3 months with ABI   Karoline Caldwell, PA-C [facility] [phone]  Clinic MD:  Laqueta Due

## 2020-04-13 ENCOUNTER — Other Ambulatory Visit: Payer: Self-pay

## 2020-04-13 DIAGNOSIS — I739 Peripheral vascular disease, unspecified: Secondary | ICD-10-CM

## 2020-04-17 DIAGNOSIS — E1165 Type 2 diabetes mellitus with hyperglycemia: Secondary | ICD-10-CM | POA: Diagnosis not present

## 2020-04-17 DIAGNOSIS — I739 Peripheral vascular disease, unspecified: Secondary | ICD-10-CM | POA: Diagnosis not present

## 2020-04-17 DIAGNOSIS — I10 Essential (primary) hypertension: Secondary | ICD-10-CM | POA: Diagnosis not present

## 2020-04-17 DIAGNOSIS — Z299 Encounter for prophylactic measures, unspecified: Secondary | ICD-10-CM | POA: Diagnosis not present

## 2020-04-24 ENCOUNTER — Other Ambulatory Visit: Payer: Self-pay | Admitting: Physician Assistant

## 2020-04-24 DIAGNOSIS — Z95828 Presence of other vascular implants and grafts: Secondary | ICD-10-CM

## 2020-04-24 NOTE — Progress Notes (Signed)
Office Visit Note  Patient: [name]             Date of Birth: [birth year]           MRN: [id number]             PCP: [name], MD Referring: [name], MD Visit Date: 05/05/2020 Occupation: @GUAROCC @  Subjective:  Other (Left wrist pain)   History of Present Illness: Fordyce Lepak is a 70 y.o. male with history of rheumatoid arthritis and osteoarthritis.  He states he has been having pain in multiple joints including his left wrist, bilateral hands, left knee, bilateral feet.  He has not noticed any joint swelling.  He states that he has been taking [place] on a regular basis.  Activities of Daily Living:  Patient reports morning stiffness for 3-4 minutes.   Patient Reports nocturnal pain.  Difficulty dressing/grooming: Denies Difficulty climbing stairs: Reports Difficulty getting out of chair: Reports Difficulty using hands for taps, buttons, cutlery, and/or writing: Reports  Review of Systems  Constitutional: Negative for fatigue.  HENT: Negative for mouth sores, mouth dryness and nose dryness.   Eyes: Negative for pain, itching and dryness.  Respiratory: Positive for shortness of breath and difficulty breathing.   Cardiovascular: Positive for chest pain. Negative for palpitations.  Gastrointestinal: Negative for blood in stool, constipation and diarrhea.  Endocrine: Negative for increased urination.  Genitourinary: Negative for difficulty urinating.  Musculoskeletal: Positive for arthralgias, joint pain, myalgias, morning stiffness, muscle tenderness and myalgias. Negative for joint swelling.  Skin: Positive for color change. Negative for rash and redness.  Allergic/Immunologic: Negative for susceptible to infections.  Neurological: Positive for numbness. Negative for dizziness, headaches, memory loss and weakness.  Hematological: Positive for bruising/bleeding tendency.  Psychiatric/Behavioral: Negative for confusion.    PMFS History:  Patient Active  Problem List   Diagnosis Date Noted  . Tremor 02/15/2019  . Cervical radiculitis 10/15/2018  . 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 Opioid dependence ([place]) 10/15/2018  . Other long term (current) drug therapy 10/15/2018  . Puncture wound of finger of left hand 08/08/2018  . Puncture wound of finger, right, complicated, initial encounter 08/05/2018  . DOE (dyspnea on exertion) 11/04/2017  . Muscle cramps 07/04/2017  . Contracture of left elbow 07/04/2017  . High risk medication use 06/23/2017  . Primary osteoarthritis of both hands 06/23/2017  . Primary osteoarthritis of both feet 06/23/2017  . 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 factor ([place]) 06/23/2017  . DDD (degenerative disc disease), cervical 06/23/2017  . Rotator cuff syndrome of right shoulder 06/06/2017  . History of  bilateral carpal tunnel release 06/06/2017  . Ganglion cyst of wrist, left 06/06/2017  . Non-insulin dependent type 2 diabetes mellitus ([place]) 06/06/2017  . Chest pain with high risk for cardiac etiology 04/05/2017  . COPD with acute exacerbation ([place]) 03/13/2017  . Plantar fasciitis 02/21/2017  . Iliac artery stenosis, right ([place]) 02/12/2017  . Hematochezia 02/10/2017  . Abnormal CT of the abdomen 02/10/2017  . Cervical spondylosis 10/11/2016  . Bony growth 10/13/2015  . Pain in joint, lower leg 03/02/2014  . Discoloration of skin-Left dorsum foot 09/28/2013  . Swelling of limb-Left Calf / Leg 09/28/2013  . PVD (peripheral vascular disease) ([place]) 10/14/2012  . Pain in limb- Left popliteal and calf 10/14/2012  . Dyslipidemia, goal LDL below 70   . Essential hypertension 03/05/2012  . 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 Generalized osteoarthritis of multiple sites 02/06/2011  .  History of arterial bypass of lower limb 12/25/2010  . Atherosclerosis of native arteries of extremity with intermittent claudication ([place]) 12/25/2010  . Coronary artery calcification  seen on CT scan 04/16/2008  . COPD  GOLD II 04/16/2008  . GASTROESOPHAGEAL REFLUX DISEASE 04/16/2008  . Rheumatoid arthritis ([place]) 04/16/2008  . DIABETES MELLITUS, BORDERLINE 04/16/2008    Past Medical History:  Diagnosis Date  . Arthritis    RA  . Bronchitis   . Carotid artery disease ([place])   . Cervical spine disease    Neck surgery in 2007 with Dr. Joya Salm  . COPD (chronic obstructive pulmonary disease) ([place])   . Coronary atherosclerosis of native coronary artery    Nonobstrtuctive by catheterization 2007, 2013, 2021  . DVT (deep venous thrombosis) ([place])   . Essential hypertension   . GERD (gastroesophageal reflux disease)   . Hyperlipidemia   . PAD (peripheral artery disease) ([place])    s/p left fem-pop bypass 2007; PTA and stenting of right iliac artery 09/2011  . Rheumatoid arthritis(714.0)   . Type 2 diabetes mellitus (HCC)     Family History  Problem Relation Age of Onset  . Heart attack Mother 81  . Deep vein thrombosis Mother   . Diabetes Mother   . Hyperlipidemia Mother   . Hypertension Mother   . Peripheral vascular disease Mother   . Stroke Father   . Diabetes Father   . Hyperlipidemia Father   . Hypertension Father   . Peripheral vascular disease Father   . Diabetes Sister   . Hyperlipidemia Sister   . Hypertension Sister   . Diabetes Brother   . Hyperlipidemia Brother   . Cancer Brother   . Deep vein thrombosis Brother   . Hypertension Brother   . Peripheral vascular disease Brother   . Hyperlipidemia Son   . Cancer Son   . Hypertension Son   . Diabetes Daughter   . Colon cancer Neg Hx    Past Surgical History:  Procedure Laterality Date  . ABDOMINAL AORTAGRAM N/A 10/03/2011   Procedure: ABDOMINAL Maxcine Ham;  Surgeon: Conrad Wahak [place], MD;  Location: [facility] CATH LAB;  Service: Cardiovascular;  Laterality: N/A;  . ABDOMINAL AORTOGRAM W/LOWER EXTREMITY N/A 01/16/2017   Procedure: ABDOMINAL AORTOGRAM W/LOWER EXTREMITY;  Surgeon: Conrad Moroni, MD;  Location: [facility] CV LAB;  Service: Cardiovascular;  Laterality: N/A;  . ABDOMINAL AORTOGRAM W/LOWER EXTREMITY Bilateral 08/04/2019   Procedure: ABDOMINAL AORTOGRAM W/LOWER EXTREMITY;  Surgeon: Marty Heck, MD;  Location: [facility] CV LAB;  Service: Cardiovascular;  Laterality: Bilateral;  . ABDOMINAL AORTOGRAM W/LOWER EXTREMITY N/A 03/09/2020   Procedure: ABDOMINAL AORTOGRAM W/LOWER EXTREMITY;  Surgeon: Marty Heck, MD;  Location: [facility] CV LAB;  Service: Cardiovascular;  Laterality: N/A;  . CERVICAL DISC SURGERY    . COLONOSCOPY WITH PROPOFOL N/A 02/17/2017   Procedure: COLONOSCOPY WITH PROPOFOL;  Surgeon: Daneil Dolin, MD;  [facility];  Service: Endoscopy;  Laterality: N/A;  2:45pm  . FEMORAL-POPLITEAL BYPASS GRAFT  11/06/2005  . ILIAC ARTERY STENT  10-03-11   Right CIA stenting  . LEFT HEART CATH AND CORONARY ANGIOGRAPHY N/A 04/14/2019   Procedure: LEFT HEART CATH AND CORONARY ANGIOGRAPHY;  Surgeon: Troy Sine, MD;  Location: [facility] LAB;  Service: Cardiovascular;  Laterality: N/A;  . PERIPHERAL VASCULAR BALLOON ANGIOPLASTY Right 01/16/2017   Procedure: PERIPHERAL VASCULAR BALLOON ANGIOPLASTY;  Surgeon: Conrad Bronson, MD;  Location: [facility] LAB;  Service: Cardiovascular;  Laterality: Right;  common iliac   . PERIPHERAL VASCULAR BALLOON ANGIOPLASTY  03/09/2020   Procedure: PERIPHERAL VASCULAR BALLOON ANGIOPLASTY;  Surgeon: Marty Heck, MD;  Location: [facility] LAB;  Service: Cardiovascular;;  . PR VEIN BYPASS GRAFT,AORTO-FEM-POP  06-22-2010   Redo Left Fem-Pop  . Repair of left arm fracture    . 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 SURGERY SCROTAL / TESTICULAR     Social History   Social History Narrative   The patient lives in [place].    Immunization History  Administered Date(s) Administered  . Influenza, High Dose Seasonal PF 10/14/2016, 09/21/2018  . Influenza-Unspecified 11/08/2008, 12/10/2019  . Moderna Sars-Covid-2  Vaccination 02/19/2019, 03/22/2019, 11/23/2019  . PPD Test 09/22/2007  . Tdap 02/09/2001, 08/05/2018     Objective: Vital Signs: BP 103/67 (BP Location: Left Arm, Patient Position: Sitting, Cuff Size: Normal)   Pulse (!) 103   Ht 5\' 9"  (1.753 m)   Wt 164 lb (74.4 kg)   BMI 24.22 kg/m    Physical Exam Vitals and nursing note reviewed.  Constitutional:      Appearance: He is well-developed.  HENT:     Head: Normocephalic and atraumatic.  Eyes:     Conjunctiva/sclera: Conjunctivae normal.     Pupils: Pupils are equal, round, and reactive to light.  Cardiovascular:     Rate and Rhythm: Normal rate and regular rhythm.     Heart sounds: Normal heart sounds.  Pulmonary:     Effort: Pulmonary effort is normal.     Breath sounds: Normal breath sounds.  Abdominal:     General: Bowel sounds are normal.     Palpations: Abdomen is soft.  Musculoskeletal:     Cervical back: Normal range of motion and neck supple.  Skin:    General: Skin is warm and dry.     Capillary Refill: Capillary refill takes less than 2 seconds.  Neurological:     Mental Status: He is alert and oriented to person, place, and time.  Psychiatric:        Behavior: Behavior normal.      Musculoskeletal Exam: C-spine and lumbar spine with good range of motion.  With good range of motion of bilateral shoulder joints.  He complained of some discomfort range of motion of his left shoulder joint.  He has old contracture in his left elbow.  He has limited range of motion of bilateral wrist joints more prominent in the left wrist joint.  He complains of tenderness over the dorsum of his left wrist.  No synovitis was noted.  There was synovial thickening over MCPs and PIPs with no synovitis.  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  Hip joints and knee joints with good range of motion.  He complains of discomfort in his left knee joint without any warmth swelling or effusion.  There was no tenderness over  ankles or MTPs.  CDAI Exam: CDAI Score: 0.7  Patient Global: 5 mm; Provider Global: 2 mm Swollen: 0 ; Tender: 0  Joint Exam 05/05/2020   No joint exam has been documented for this visit   There is currently no information documented on the homunculus. Go to the Rheumatology activity and complete the homunculus joint exam.  Investigation: No additional findings.  Imaging: VAS [place] ABI WITH/WO TBI  Result Date: 04/12/2020 LOWER EXTREMITY DOPPLER STUDY Indications: Peripheral artery disease. High Risk Factors: Hypertension, Diabetes.  Vascular Interventions: 03/09/2020 Right mid SFA DCPTA  Previous                         R CIA stent                         Left CFA-pop bypass. Performing Technologist: Delorise Shiner RVT  Examination Guidelines: A complete evaluation includes at minimum, Doppler waveform signals and systolic blood pressure reading at the level of bilateral brachial, anterior tibial, and posterior tibial arteries, when vessel segments are accessible. Bilateral testing is considered an integral part of a complete examination. Photoelectric Plethysmograph (PPG) waveforms and toe systolic pressure readings are included as required and additional duplex testing as needed. Limited examinations for reoccurring indications may be performed as noted.  ABI Findings: +---------+------------------+-----+---------+--------+ Right    Rt Pressure (mmHg)IndexWaveform Comment  +---------+------------------+-----+---------+--------+ Brachial 119                                      +---------+------------------+-----+---------+--------+ ATA      91                0.73                   +---------+------------------+-----+---------+--------+ PTA      116               0.94 triphasic         +---------+------------------+-----+---------+--------+ DP                              triphasic         +---------+------------------+-----+---------+--------+  Great Toe32                0.26                   +---------+------------------+-----+---------+--------+ +---------+------------------+-----+----------+-------+ Left     Lt Pressure (mmHg)IndexWaveform  Comment +---------+------------------+-----+----------+-------+ Brachial 124                                      +---------+------------------+-----+----------+-------+ ATA      164               1.32                   +---------+------------------+-----+----------+-------+ PTA      148               1.19 triphasic         +---------+------------------+-----+----------+-------+ DP                              monophasic        +---------+------------------+-----+----------+-------+ Great Toe58                0.47                   +---------+------------------+-----+----------+-------+ +-------+-----------+-----------+----------------+------------+ ABI/TBIToday's ABIToday's TBIPrevious ABI    Previous TBI +-------+-----------+-----------+----------------+------------+ Right  0.94       0.26       Non compressible0            +-------+-----------+-----------+----------------+------------+ Left   1.32       0.47       Calcified  0.55         +-------+-----------+-----------+----------------+------------+  Summary: Right: Resting right ankle-brachial index indicates mild right lower extremity arterial disease. The right toe-brachial index is abnormal. RT great toe pressure = 32 mmHg. Left: Resting left ankle-brachial index indicates noncompressible left lower extremity arteries. The left toe-brachial index is abnormal. LT Great toe pressure = 58 mmHg.  *See table(s) above for measurements and observations.  Electronically signed by Deitra Mayo MD on 04/12/2020 at 12:23:20 PM.    Final    VAS [place] LOWER EXTREMITY ARTERIAL DUPLEX  Result Date: 04/12/2020 LOWER EXTREMITY ARTERIAL DUPLEX STUDY Indications: Claudication, and peripheral artery disease.  High Risk Factors: Hypertension, hyperlipidemia, Diabetes, past history of                    smoking.  Vascular Interventions: 03/09/2020 Mid SFA DCPTA                          Prior                         R CIA stent                         Left CFA-pop bypass. Current ABI:            Right: 0.94, Left: 1.32 Performing Technologist: Delorise Shiner RVT  Examination Guidelines: A complete evaluation includes B-mode imaging, spectral Doppler, color Doppler, and power Doppler as needed of all accessible portions of each vessel. Bilateral testing is considered an integral part of a complete examination. Limited examinations for reoccurring indications may be performed as noted.  +----------+--------+-----+--------+---------+--------+ RIGHT     PSV cm/sRatioStenosisWaveform Comments +----------+--------+-----+--------+---------+--------+ CIA Mid   189                  triphasicstent    +----------+--------+-----+--------+---------+--------+ CFA Distal57                   triphasic         +----------+--------+-----+--------+---------+--------+ DFA       55                   triphasic         +----------+--------+-----+--------+---------+--------+ SFA Prox  82                   triphasic         +----------+--------+-----+--------+---------+--------+ SFA Mid   115                  triphasic         +----------+--------+-----+--------+---------+--------+ SFA Distal46                   triphasic         +----------+--------+-----+--------+---------+--------+ POP Prox  63                   triphasic         +----------+--------+-----+--------+---------+--------+ POP Distal59                   triphasic         +----------+--------+-----+--------+---------+--------+ Patent common iliac artery stent. No stenosis identified in the SFA.  +----------+--------+-----+--------+---------+--------+ LEFT      PSV cm/sRatioStenosisWaveform Comments  +----------+--------+-----+--------+---------+--------+ CFA Distal115                  triphasic         +----------+--------+-----+--------+---------+--------+  Left Graft #1: Fem-pop bypass graft +--------------------+--------+--------+---------+--------+                     PSV cm/sStenosisWaveform Comments +--------------------+--------+--------+---------+--------+ Inflow              92              triphasic         +--------------------+--------+--------+---------+--------+ Proximal Anastomosis136             triphasic         +--------------------+--------+--------+---------+--------+ Proximal Graft      65              triphasic         +--------------------+--------+--------+---------+--------+ Mid Graft           55              triphasic         +--------------------+--------+--------+---------+--------+ Distal Graft        62              biphasic          +--------------------+--------+--------+---------+--------+ Distal Anastomosis  85              triphasic         +--------------------+--------+--------+---------+--------+ Outflow             46              triphasic         +--------------------+--------+--------+---------+--------+   Summary: Right: Near normal examination. Patent stent with no evidence of stenosis in the Right common iliac artery. No focal stenosis identified in the SFA. Left: Patent left fem-pop bypass graft.  See table(s) above for measurements and observations. Electronically signed by Deitra Mayo MD on 04/12/2020 at 12:23:49 PM.    Final     Recent Labs: Lab Results  Component Value Date   WBC 8.4 01/27/2020   HGB 13.6 03/09/2020   PLT 326 01/27/2020   NA 141 03/09/2020   K 4.1 03/09/2020   CL 104 03/09/2020   CO2 27 01/27/2020   GLUCOSE 180 (H) 03/09/2020   BUN 18 03/09/2020   CREATININE 0.90 03/09/2020   BILITOT 0.4 01/27/2020   ALKPHOS 79 10/19/2019   AST 17 01/27/2020   ALT 17 01/27/2020    PROT 7.0 01/27/2020   ALBUMIN 4.4 10/19/2019   CALCIUM 9.5 01/27/2020   GFRAA 83 01/27/2020   QFTBGOLDPLUS Negative 10/19/2019   May 01, 2020 CBC showed hemoglobin of 11.9, WBC and platelets were normal.  CMP was normal.  Speciality Comments: Prior therapy includes: methotrexate ( d/c alcohol use), Arava (d/c patient preference), [place] (patient declined)  Procedures:  No procedures performed Allergies: Patient has no known allergies.   Assessment / Plan:     Visit Diagnoses: 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 factor (HCC)-he complains of ongoing pain and discomfort in multiple joints.  I do not see any synovitis on examination.  I believe his arthritis is well controlled on Xeljanz 5 mg p.o. daily.  High risk medication use - Xeljanz 5 mg 1 tablet by mouth daily.  Methotrexate discontinued due to alcohol use, Orencia declined by patient, treated with Morrie Sheldon initially at [facility]: Lipid panel,   Contracture of left elbow - Unchanged  Pain in both hands -he complains of pain and discomfort in his bilateral hands.  Especially over the dorsum of his left hand.  He would like to see a  hand specialist.  I will refer him to orthopedics.  Plan: XR Hand 2 View Right, XR Hand 2 View Left wrist.  X-rays were consistent with rheumatoid arthritis and osteoarthritis.  There is no radiographic progression when compared to the x-rays of 2019.  X-ray findings were discussed with the patient.  Primary osteoarthritis of both hands-he has underlying osteoarthritis in his hands.  Chronic pain of left knee -complains of discomfort in his left knee joint.  Plan: XR KNEE 3 VIEW LEFT.  X-ray showed mild osteoarthritis and moderate chondromalacia patella.  X-ray findings were discussed with the patient.  Primary osteoarthritis of both feet -complains of pain and discomfort in his bilateral feet.  Plan: XR Foot 2 Views Right, XR Foot 2 Views Left.  X-rays are consistent  with rheumatoid arthritis and osteoarthritis.  No radiographic progression was noted when compared to the x-rays of 2019.  X-ray findings were discussed with the patient.  DDD (degenerative disc disease), cervical-he had good range of motion of the cervical spine without discomfort.  Rotator cuff syndrome of right shoulder-he had good range of motion without discomfort.  History of  bilateral carpal tunnel release   Other medical problems are listed as follows:  Essential hypertension-his blood pressure is normal today.  Mixed hyperlipidemia  Atherosclerosis of native coronary artery of native heart without angina pectoris  PVD (peripheral vascular disease) ([place])  History of diabetes mellitus  COPD  GOLD II  Orders: Orders Placed This Encounter  Procedures  . XR Hand 2 View Right  . XR Hand 2 View Left  . XR Foot 2 Views Right  . XR Foot 2 Views Left  . XR KNEE 3 VIEW LEFT  . Lipid panel   No orders of the defined types were placed in this encounter.     Follow-Up Instructions: Return in about 5 months (around 10/05/2020) for Rheumatoid arthritis.   Bo Merino, MD  Note - This record has been created using Editor, commissioning.  Chart creation errors have been sought, but may not always  have been located. Such creation errors do not reflect on  the standard of medical care.

## 2020-05-01 ENCOUNTER — Encounter: Payer: Self-pay | Admitting: Cardiology

## 2020-05-01 DIAGNOSIS — E119 Type 2 diabetes mellitus without complications: Secondary | ICD-10-CM | POA: Diagnosis not present

## 2020-05-01 DIAGNOSIS — R079 Chest pain, unspecified: Secondary | ICD-10-CM | POA: Diagnosis not present

## 2020-05-01 DIAGNOSIS — Z20822 Contact with and (suspected) exposure to covid-19: Secondary | ICD-10-CM | POA: Diagnosis not present

## 2020-05-01 DIAGNOSIS — M542 Cervicalgia: Secondary | ICD-10-CM | POA: Diagnosis not present

## 2020-05-01 DIAGNOSIS — Z79899 Other long term (current) drug therapy: Secondary | ICD-10-CM | POA: Diagnosis not present

## 2020-05-01 DIAGNOSIS — J449 Chronic obstructive pulmonary disease, unspecified: Secondary | ICD-10-CM | POA: Diagnosis not present

## 2020-05-01 DIAGNOSIS — I2510 Atherosclerotic heart disease of native coronary artery without angina pectoris: Secondary | ICD-10-CM | POA: Diagnosis not present

## 2020-05-01 DIAGNOSIS — I10 Essential (primary) hypertension: Secondary | ICD-10-CM | POA: Diagnosis not present

## 2020-05-01 DIAGNOSIS — R9431 Abnormal electrocardiogram [ECG] [EKG]: Secondary | ICD-10-CM | POA: Diagnosis not present

## 2020-05-04 DIAGNOSIS — R42 Dizziness and giddiness: Secondary | ICD-10-CM | POA: Diagnosis not present

## 2020-05-04 DIAGNOSIS — Z299 Encounter for prophylactic measures, unspecified: Secondary | ICD-10-CM | POA: Diagnosis not present

## 2020-05-04 DIAGNOSIS — M542 Cervicalgia: Secondary | ICD-10-CM | POA: Diagnosis not present

## 2020-05-04 DIAGNOSIS — Z6823 Body mass index (BMI) 23.0-23.9, adult: Secondary | ICD-10-CM | POA: Diagnosis not present

## 2020-05-04 DIAGNOSIS — I739 Peripheral vascular disease, unspecified: Secondary | ICD-10-CM | POA: Diagnosis not present

## 2020-05-04 DIAGNOSIS — R079 Chest pain, unspecified: Secondary | ICD-10-CM | POA: Diagnosis not present

## 2020-05-05 ENCOUNTER — Ambulatory Visit: Payer: Self-pay

## 2020-05-05 ENCOUNTER — Encounter: Payer: Self-pay | Admitting: Rheumatology

## 2020-05-05 ENCOUNTER — Other Ambulatory Visit: Payer: Self-pay

## 2020-05-05 ENCOUNTER — Ambulatory Visit (INDEPENDENT_AMBULATORY_CARE_PROVIDER_SITE_OTHER): Payer: Medicare Other | Admitting: Rheumatology

## 2020-05-05 VITALS — BP 103/67 | HR 103 | Ht 69.0 in | Wt 164.0 lb

## 2020-05-05 DIAGNOSIS — I2510 Atherosclerotic heart disease of native coronary artery without angina pectoris: Secondary | ICD-10-CM

## 2020-05-05 DIAGNOSIS — M75101 Unspecified rotator cuff tear or rupture of right shoulder, not specified as traumatic: Secondary | ICD-10-CM

## 2020-05-05 DIAGNOSIS — M19072 Primary osteoarthritis, left ankle and foot: Secondary | ICD-10-CM | POA: Diagnosis not present

## 2020-05-05 DIAGNOSIS — M19042 Primary osteoarthritis, left hand: Secondary | ICD-10-CM

## 2020-05-05 DIAGNOSIS — E782 Mixed hyperlipidemia: Secondary | ICD-10-CM | POA: Diagnosis not present

## 2020-05-05 DIAGNOSIS — M24522 Contracture, left elbow: Secondary | ICD-10-CM | POA: Diagnosis not present

## 2020-05-05 DIAGNOSIS — M25562 Pain in left knee: Secondary | ICD-10-CM

## 2020-05-05 DIAGNOSIS — Z9889 Other specified postprocedural states: Secondary | ICD-10-CM | POA: Diagnosis not present

## 2020-05-05 DIAGNOSIS — M79642 Pain in left hand: Secondary | ICD-10-CM

## 2020-05-05 DIAGNOSIS — I10 Essential (primary) hypertension: Secondary | ICD-10-CM

## 2020-05-05 DIAGNOSIS — M0609 Rheumatoid arthritis without rheumatoid factor, multiple sites: Secondary | ICD-10-CM | POA: Diagnosis not present

## 2020-05-05 DIAGNOSIS — M5030 Other cervical disc degeneration, unspecified cervical region: Secondary | ICD-10-CM | POA: Diagnosis not present

## 2020-05-05 DIAGNOSIS — M67432 Ganglion, left wrist: Secondary | ICD-10-CM

## 2020-05-05 DIAGNOSIS — I739 Peripheral vascular disease, unspecified: Secondary | ICD-10-CM

## 2020-05-05 DIAGNOSIS — M19071 Primary osteoarthritis, right ankle and foot: Secondary | ICD-10-CM

## 2020-05-05 DIAGNOSIS — M19041 Primary osteoarthritis, right hand: Secondary | ICD-10-CM

## 2020-05-05 DIAGNOSIS — E1165 Type 2 diabetes mellitus with hyperglycemia: Secondary | ICD-10-CM | POA: Diagnosis not present

## 2020-05-05 DIAGNOSIS — Z79899 Other long term (current) drug therapy: Secondary | ICD-10-CM

## 2020-05-05 DIAGNOSIS — G8929 Other chronic pain: Secondary | ICD-10-CM

## 2020-05-05 DIAGNOSIS — M79641 Pain in right hand: Secondary | ICD-10-CM | POA: Diagnosis not present

## 2020-05-05 DIAGNOSIS — J449 Chronic obstructive pulmonary disease, unspecified: Secondary | ICD-10-CM

## 2020-05-05 DIAGNOSIS — Z8639 Personal history of other endocrine, nutritional and metabolic disease: Secondary | ICD-10-CM

## 2020-05-05 NOTE — Addendum Note (Signed)
Addended by: Francis Gaines C on: 05/05/2020 11:11 AM   Modules accepted: Orders

## 2020-05-05 NOTE — Patient Instructions (Addendum)
Standing Labs We placed an order today for your standing lab work.   Please have your standing labs drawn in July and every 3 months  If possible, please have your labs drawn 2 weeks prior to your appointment so that the provider can discuss your results at your appointment.  We have open lab daily Monday through Thursday from 1:30-4:30 PM and Friday from 1:30-4:00 PM at the office of Dr. Bo Merino, [facility].   Please be advised, all patients with office appointments requiring lab work will take precedents over walk-in lab work.  If possible, please come for your lab work on Monday and Friday afternoons, as you may experience shorter wait times. The office is located at [address] [place], [place],  [address] No appointment is necessary.   Labs are drawn by [facility]. Please bring your co-pay at the time of your lab draw.  You may receive a bill from [facility] for your lab work.  If you wish to have your labs drawn at another location, please call the office 24 hours in advance to send orders.  If you have any questions regarding directions or hours of operation,  please call [phone].   As a reminder, please drink plenty of water prior to coming for your lab work. Thanks!  COVID-19 vaccine recommendations:   COVID-19 vaccine is recommended for everyone (unless you are allergic to a vaccine component), even if you are on a medication that suppresses your immune system.   If you are on Methotrexate, Cellcept (mycophenolate), Rinvoq, Morrie Sheldon, and Olumiant- hold the medication for 1 week after each vaccine. Hold Methotrexate for 2 weeks after the single dose COVID-19 vaccine.   The recommendations are that the patients on immunosuppressive agents should get first 3 COVID-19 vaccines 1 month apart and then a fourth dose (booster) 3 months after the third dose.  There is no direct evidence about the efficacy of the COVID-19 vaccine in individuals who are on  medications that suppress the immune system.   Even if you are fully vaccinated, and you are on any medications that suppress your immune system, please continue to wear a mask, maintain at least six feet social distance and practice hand hygiene.   If you develop a COVID-19 infection, please contact your PCP or our office to determine if you need monoclonal antibody infusion.  The booster vaccine is now available for immunocompromised patients.   Please see the following web sites for updated information.   [email or web address]-Patient-[email or web address]  Vaccines You are taking a medication(s) that can suppress your immune system.  The following immunizations are recommended: . Flu annually . Covid-19  . Pneumonia (Pneumovax 23 and Prevnar 13 spaced at least 1 year apart) . Shingrix (after age 70)  Please check with your PCP to make sure you are up to date.   Heart Disease Prevention   Your inflammatory disease increases your risk of heart disease which includes heart attack, stroke, atrial fibrillation (irregular heartbeats), high blood pressure, heart failure and atherosclerosis (plaque in the arteries).  It is important to reduce your risk by:   . Keep blood pressure, cholesterol, and blood sugar at healthy levels   . Smoking Cessation   . Maintain a healthy weight  o BMI 20-25   . Eat a healthy diet  o Plenty of fresh fruit, vegetables, and whole grains  o Limit saturated fats, foods high in sodium, and added sugars  o DASH and Mediterranean diet   . Increase physical activity  o Recommend moderate physically activity for 150 minutes per week/ 30 minutes a day for five days a week These can be broken up into three separate ten-minute sessions during the day.   . Reduce Stress  . Meditation, slow breathing exercises, yoga, coloring books  . Dental visits twice a year

## 2020-05-06 LAB — LIPID PANEL
Cholesterol: 148 mg/dL (ref <200)
HDL: 63 mg/dL (ref >=40)
LDL Cholesterol (Calc): 71 mg/dL (calc)
Non-HDL Cholesterol (Calc): 85 mg/dL (calc) (ref <130)
Total CHOL/HDL Ratio: 2.3 (calc) (ref <5.0)
Triglycerides: 49 mg/dL (ref <150)

## 2020-05-06 NOTE — Progress Notes (Signed)
Lipid panel is within normal limits.  Please forward results to his PCP.

## 2020-05-10 ENCOUNTER — Encounter: Payer: Self-pay | Admitting: Podiatry

## 2020-05-10 ENCOUNTER — Ambulatory Visit (INDEPENDENT_AMBULATORY_CARE_PROVIDER_SITE_OTHER): Payer: Medicare Other | Admitting: Podiatry

## 2020-05-10 ENCOUNTER — Other Ambulatory Visit: Payer: Self-pay

## 2020-05-10 DIAGNOSIS — M79674 Pain in right toe(s): Secondary | ICD-10-CM

## 2020-05-10 DIAGNOSIS — M2012 Hallux valgus (acquired), left foot: Secondary | ICD-10-CM

## 2020-05-10 DIAGNOSIS — E1159 Type 2 diabetes mellitus with other circulatory complications: Secondary | ICD-10-CM

## 2020-05-10 DIAGNOSIS — B351 Tinea unguium: Secondary | ICD-10-CM

## 2020-05-10 DIAGNOSIS — M79675 Pain in left toe(s): Secondary | ICD-10-CM

## 2020-05-10 DIAGNOSIS — M2011 Hallux valgus (acquired), right foot: Secondary | ICD-10-CM

## 2020-05-10 DIAGNOSIS — L84 Corns and callosities: Secondary | ICD-10-CM

## 2020-05-10 NOTE — Progress Notes (Signed)
  Subjective:  Patient ID: [name], male    DOB: Jun 23, 1950,  MRN: [id number]  70 y.o. male presents with at risk foot care. Pt has h/o NIDDM with PAD and painful thick toenails that are difficult to trim. Pain interferes with ambulation. Aggravating factors include wearing enclosed shoe gear. Pain is relieved with periodic professional debridement.  Patient's blood sugar was 116 mg/dl this morning.  PCP: [name], MD and last visit was: last week.  Patient states he saw Vascular Team April 12, 2020.  Patient states he had two stents placed in his right LE in March for occlusion.  Review of Systems: Negative except as noted in the HPI.   No Known Allergies  Objective:  There were no vitals filed for this visit. Constitutional Patient is a pleasant 70 y.o. African American male WD, WN in NAD. AAO x 3.  Vascular Capillary refill time to digits immediate b/l. Nonpalpable DP pulse(s) b/l lower extremities. Nonpalpable PT pulse(s) b/l lower extremities. Pedal hair absent. Lower extremity skin temperature gradient within normal limits. No pain with calf compression b/l. No ischemia or gangrene noted b/l lower extremities. No cyanosis or clubbing noted.  Neurologic Normal speech. Pt has subjective symptoms of neuropathy. Protective sensation intact 5/5 intact bilaterally with 10g monofilament b/l.  Dermatologic Pedal skin with normal turgor, texture and tone bilaterally. No open wounds bilaterally. No interdigital macerations bilaterally. Toenails 1-5 b/l elongated, discolored, dystrophic, thickened, crumbly with subungual debris and tenderness to dorsal palpation. Hyperkeratotic lesion(s) submet head 3 right foot.  No erythema, no edema, no drainage, no fluctuance.  Orthopedic: Normal muscle strength 5/5 to all lower extremity muscle groups bilaterally. No pain crepitus or joint limitation noted with ROM b/l. Hallux valgus with bunion deformity noted b/l lower extremities.   ABIs  04/12/2020:  Right: 0.94 Left: 1.32  TBIs: Right: 0.26 Left:  0.47  Noncompressible RLE Calcified LLE  Assessment:   1. Pain due to onychomycosis of toenails of both feet   2. Callus   3. Hallux valgus, acquired, bilateral   4. Type 2 diabetes mellitus with vascular disease ([name])    Plan:  Patient examined today.  No new findings.  No new orders. Toenails 1 through 5 bilaterally with debridement length and girth without incident. Callus submitted head 3 right foot was pared without incident. He is to continue preventative diabetic foot care measures and report any pedal incidents to the medical professional immediately. Continues supportive shoe daily.  Return in about 9 weeks (around 07/12/2020).  Marzetta Board, DPM

## 2020-05-15 DIAGNOSIS — Z0000 Encounter for general adult medical examination without abnormal findings: Secondary | ICD-10-CM | POA: Diagnosis not present

## 2020-05-15 DIAGNOSIS — I10 Essential (primary) hypertension: Secondary | ICD-10-CM | POA: Diagnosis not present

## 2020-05-15 DIAGNOSIS — Z7189 Other specified counseling: Secondary | ICD-10-CM | POA: Diagnosis not present

## 2020-05-15 DIAGNOSIS — Z87891 Personal history of nicotine dependence: Secondary | ICD-10-CM | POA: Diagnosis not present

## 2020-05-15 DIAGNOSIS — E1165 Type 2 diabetes mellitus with hyperglycemia: Secondary | ICD-10-CM | POA: Diagnosis not present

## 2020-05-15 DIAGNOSIS — Z6823 Body mass index (BMI) 23.0-23.9, adult: Secondary | ICD-10-CM | POA: Diagnosis not present

## 2020-05-15 DIAGNOSIS — Z299 Encounter for prophylactic measures, unspecified: Secondary | ICD-10-CM | POA: Diagnosis not present

## 2020-05-17 DIAGNOSIS — E7800 Pure hypercholesterolemia, unspecified: Secondary | ICD-10-CM | POA: Diagnosis not present

## 2020-05-17 DIAGNOSIS — R5383 Other fatigue: Secondary | ICD-10-CM | POA: Diagnosis not present

## 2020-05-17 DIAGNOSIS — E559 Vitamin D deficiency, unspecified: Secondary | ICD-10-CM | POA: Diagnosis not present

## 2020-05-17 DIAGNOSIS — Z79899 Other long term (current) drug therapy: Secondary | ICD-10-CM | POA: Diagnosis not present

## 2020-05-19 ENCOUNTER — Ambulatory Visit (INDEPENDENT_AMBULATORY_CARE_PROVIDER_SITE_OTHER): Payer: Medicare Other | Admitting: Orthopaedic Surgery

## 2020-05-19 ENCOUNTER — Ambulatory Visit: Payer: Self-pay

## 2020-05-19 ENCOUNTER — Encounter: Payer: Self-pay | Admitting: Orthopaedic Surgery

## 2020-05-19 DIAGNOSIS — M25532 Pain in left wrist: Secondary | ICD-10-CM

## 2020-05-19 NOTE — Progress Notes (Signed)
Office Visit Note   Patient: [name]           Date of Birth: 1950-07-11           MRN: [id number] Visit Date: 05/19/2020              Requested by: Bo Merino, MD [address],  [place] [address] PCP: [name], MD   Assessment & Plan: Visit Diagnoses:  1. Pain in left wrist     Plan: Based on findings he does have advanced degenerative changes of the left wrist due to his rheumatoid arthritis.  Sounds like he is mainly symptomatic when he is more active but as long as he backs off on activity the symptoms appear to be fairly tolerable.  I reviewed the x-rays with [name] and I feel that they will only good solution for prolonged pain relief would be a PRC however I recommend that if he is able to modify his activities and get his pain to a tolerable level then I would not recommend surgery.  He will think about his options and let us know.  He does not want a cortisone injection as prior ones have made his sugars jump into the 400-500 range.  Follow-Up Instructions: Return if symptoms worsen or fail to improve.   Orders:  Orders Placed This Encounter  Procedures  . XR Wrist Complete Left   No orders of the defined types were placed in this encounter.     Procedures: No procedures performed   Clinical Data: No additional findings.   Subjective: Chief Complaint  Patient presents with  . Left Wrist - Pain    Mr. Wissner a very pleasant 70 year old gentleman referral from Dr. Cyndie Chime for for evaluation of chronic left wrist pain.  He reports 20 years of wrist pain with swelling during flareups.  He wears a supportive brace to help with pain during activity.  He is a diabetic and has history of peripheral vascular disease and coronary artery disease.  He notices very limited range of motion of the wrist.  He does continue to be active and he does a lot of fishing.   Review of Systems  Constitutional: Negative.   All other systems  reviewed and are negative.    Objective: Vital Signs: There were no vitals taken for this visit.  Physical Exam Vitals and nursing note reviewed.  Constitutional:      Appearance: He is well-developed.  HENT:     Head: Normocephalic and atraumatic.  Eyes:     Pupils: Pupils are equal, round, and reactive to light.  Pulmonary:     Effort: Pulmonary effort is normal.  Abdominal:     Palpations: Abdomen is soft.  Musculoskeletal:        General: Normal range of motion.     Cervical back: Neck supple.  Skin:    General: Skin is warm.  Neurological:     Mental Status: He is alert and oriented to person, place, and time.  Psychiatric:        Behavior: Behavior normal.        Thought Content: Thought content normal.        Judgment: Judgment normal.     Ortho Exam Left wrist shows 5 degrees of extension 10 degrees of flexion.  5 degrees of radial deviation and ulnar deviation.  Negative grind negative Finkelstein.  Slight swelling over the radiocarpal joint. Specialty Comments:  No specialty comments available.  Imaging: XR  Wrist Complete Left  Result Date: 05/19/2020 Advanced degenerative changes of the radiocarpal joint.  Midcarpal joint appears to be degenerative as well.    PMFS History: Patient Active Problem List   Diagnosis Date Noted  . Tremor 02/15/2019  . Cervical radiculitis 10/15/2018  . 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 Opioid dependence ([place]) 10/15/2018  . Other long term (current) drug therapy 10/15/2018  . Puncture wound of finger of left hand 08/08/2018  . Puncture wound of finger, right, complicated, initial encounter 08/05/2018  . DOE (dyspnea on exertion) 11/04/2017  . Muscle cramps 07/04/2017  . Contracture of left elbow 07/04/2017  . High risk medication use 06/23/2017  . Primary osteoarthritis of both hands 06/23/2017  . Primary osteoarthritis of both feet 06/23/2017  . Rheumatoid arthritis of multiple sites with negative  rheumatoid factor ([place]) 06/23/2017  . DDD (degenerative disc disease), cervical 06/23/2017  . Rotator cuff syndrome of right shoulder 06/06/2017  . History of  bilateral carpal tunnel release 06/06/2017  . Ganglion cyst of wrist, left 06/06/2017  . Non-insulin dependent type 2 diabetes mellitus ([place]) 06/06/2017  . Chest pain with high risk for cardiac etiology 04/05/2017  . COPD with acute exacerbation ([place]) 03/13/2017  . Plantar fasciitis 02/21/2017  . Iliac artery stenosis, right ([place]) 02/12/2017  . Hematochezia 02/10/2017  . Abnormal CT of the abdomen 02/10/2017  . Cervical spondylosis 10/11/2016  . Bony growth 10/13/2015  . Pain in joint, lower leg 03/02/2014  . Discoloration of skin-Left dorsum foot 09/28/2013  . Swelling of limb-Left Calf / Leg 09/28/2013  . PVD (peripheral vascular disease) ([place]) 10/14/2012  . Pain in limb- Left popliteal and calf 10/14/2012  . Dyslipidemia, goal LDL below 70   . Essential hypertension 03/05/2012  . 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 Generalized osteoarthritis of multiple sites 02/06/2011  . History of arterial bypass of lower limb 12/25/2010  . Atherosclerosis of native arteries of extremity with intermittent claudication ([place]) 12/25/2010  . Coronary artery calcification seen on CT scan 04/16/2008  . COPD  GOLD II 04/16/2008  . GASTROESOPHAGEAL REFLUX DISEASE 04/16/2008  . Rheumatoid arthritis ([place]) 04/16/2008  . DIABETES MELLITUS, BORDERLINE 04/16/2008   Past Medical History:  Diagnosis Date  . Arthritis    RA  . Bronchitis   . Carotid artery disease ([place])   . Cervical spine disease    Neck surgery in 2007 with Dr. Joya Salm  . COPD (chronic obstructive pulmonary disease) ([place])   . Coronary atherosclerosis of native coronary artery    Nonobstrtuctive by catheterization 2007, 2013, 2021  . DVT (deep venous thrombosis) ([place])   . Essential hypertension   . GERD (gastroesophageal reflux disease)   . Hyperlipidemia   .  PAD (peripheral artery disease) ([place])    s/p left fem-pop bypass 2007; PTA and stenting of right iliac artery 09/2011  . Rheumatoid arthritis(714.0)   . Type 2 diabetes mellitus ([facility])     Family History  Problem Relation Age of Onset  . Heart attack Mother 62  . Deep vein thrombosis Mother   . Diabetes Mother   . Hyperlipidemia Mother   . Hypertension Mother   . Peripheral vascular disease Mother   . Stroke Father   . Diabetes Father   . Hyperlipidemia Father   . Hypertension Father   . Peripheral vascular disease Father   . Diabetes Sister   . Hyperlipidemia Sister   . Hypertension Sister   . Diabetes Brother   . Hyperlipidemia Brother   .  Cancer Brother   . Deep vein thrombosis Brother   . Hypertension Brother   . Peripheral vascular disease Brother   . Hyperlipidemia Son   . Cancer Son   . Hypertension Son   . Diabetes Daughter   . Colon cancer Neg Hx     Past Surgical History:  Procedure Laterality Date  . ABDOMINAL AORTAGRAM N/A 10/03/2011   Procedure: ABDOMINAL Maxcine Ham;  Surgeon: Conrad Evergreen, MD;  Location: [facility] CATH LAB;  Service: Cardiovascular;  Laterality: N/A;  . ABDOMINAL AORTOGRAM W/LOWER EXTREMITY N/A 01/16/2017   Procedure: ABDOMINAL AORTOGRAM W/LOWER EXTREMITY;  Surgeon: Conrad [place], MD;  Location: [facility] CV LAB;  Service: Cardiovascular;  Laterality: N/A;  . ABDOMINAL AORTOGRAM W/LOWER EXTREMITY Bilateral 08/04/2019   Procedure: ABDOMINAL AORTOGRAM W/LOWER EXTREMITY;  Surgeon: Marty Heck, MD;  Location: [facility] CV LAB;  Service: Cardiovascular;  Laterality: Bilateral;  . ABDOMINAL AORTOGRAM W/LOWER EXTREMITY N/A 03/09/2020   Procedure: ABDOMINAL AORTOGRAM W/LOWER EXTREMITY;  Surgeon: Marty Heck, MD;  Location: [facility] CV LAB;  Service: Cardiovascular;  Laterality: N/A;  . CERVICAL DISC SURGERY    . COLONOSCOPY WITH PROPOFOL N/A 02/17/2017   Procedure: COLONOSCOPY WITH PROPOFOL;  Surgeon: Daneil Dolin, MD;  Location[facility];  Service: Endoscopy;  Laterality: N/A;  2:45pm  . FEMORAL-POPLITEAL BYPASS GRAFT  11/06/2005  . ILIAC ARTERY STENT  10-03-11   Right CIA stenting  . LEFT HEART CATH AND CORONARY ANGIOGRAPHY N/A 04/14/2019   Procedure: LEFT HEART CATH AND CORONARY ANGIOGRAPHY;  Surgeon: Troy Sine, MD;  Location: [facility] CV LAB;  Service: Cardiovascular;  Laterality: N/A;  . PERIPHERAL VASCULAR BALLOON ANGIOPLASTY Right 01/16/2017   Procedure: PERIPHERAL VASCULAR BALLOON ANGIOPLASTY;  Surgeon: Conrad , MD;  Location: [facility] LAB;  Service: Cardiovascular;  Laterality: Right;  common iliac   . PERIPHERAL VASCULAR BALLOON ANGIOPLASTY  03/09/2020   Procedure: PERIPHERAL VASCULAR BALLOON ANGIOPLASTY;  Surgeon: Marty Heck, MD;  Location: [facility] CV LAB;  Service: Cardiovascular;;  . PR VEIN BYPASS GRAFT,AORTO-FEM-POP  06-22-2010   Redo Left Fem-Pop  . Repair of left arm fracture    . 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 SURGERY SCROTAL / TESTICULAR     Social History   Occupational History  . Not on file  Tobacco Use  . Smoking status: Former Smoker    Packs/day: 1.00    Years: 45.00    Pack years: 45.00    Types: Cigarettes, Cigars    Quit date: 09/01/2011    Years since quitting: 8.7  . Smokeless tobacco: Never Used  Vaping Use  . Vaping Use: Never used  Substance and Sexual Activity  . Alcohol use: No    Alcohol/week: 0.0 standard drinks  . Drug use: No  . Sexual activity: Not Currently    Birth control/protection: None

## 2020-05-24 DIAGNOSIS — I6523 Occlusion and stenosis of bilateral carotid arteries: Secondary | ICD-10-CM | POA: Diagnosis not present

## 2020-05-24 DIAGNOSIS — R55 Syncope and collapse: Secondary | ICD-10-CM | POA: Diagnosis not present

## 2020-05-31 ENCOUNTER — Other Ambulatory Visit: Payer: Self-pay | Admitting: Physician Assistant

## 2020-05-31 NOTE — Telephone Encounter (Signed)
Next Visit: 10/06/2020  Last Visit: 05/05/2020  Last Fill: 02/03/2020  DX: 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 factor   Current Dose per office note 05/05/2020,  [name] 5 mg 1 tablet by mouth daily.   Labs: 05/01/2020, HGB 11.9, MCHC 31.2, RDW 14.9,   TB Gold: 10/19/2019, negative  Okay to refill [name]?

## 2020-06-06 DIAGNOSIS — E1165 Type 2 diabetes mellitus with hyperglycemia: Secondary | ICD-10-CM | POA: Diagnosis not present

## 2020-06-06 DIAGNOSIS — I10 Essential (primary) hypertension: Secondary | ICD-10-CM | POA: Diagnosis not present

## 2020-06-12 ENCOUNTER — Encounter: Payer: Self-pay | Admitting: Cardiology

## 2020-06-12 DIAGNOSIS — R634 Abnormal weight loss: Secondary | ICD-10-CM | POA: Diagnosis not present

## 2020-06-12 DIAGNOSIS — I739 Peripheral vascular disease, unspecified: Secondary | ICD-10-CM | POA: Diagnosis not present

## 2020-06-12 DIAGNOSIS — I10 Essential (primary) hypertension: Secondary | ICD-10-CM | POA: Diagnosis not present

## 2020-06-12 DIAGNOSIS — R079 Chest pain, unspecified: Secondary | ICD-10-CM | POA: Diagnosis not present

## 2020-06-12 DIAGNOSIS — Z299 Encounter for prophylactic measures, unspecified: Secondary | ICD-10-CM | POA: Diagnosis not present

## 2020-06-14 DIAGNOSIS — D7389 Other diseases of spleen: Secondary | ICD-10-CM | POA: Diagnosis not present

## 2020-06-14 DIAGNOSIS — K5750 Diverticulosis of both small and large intestine without perforation or abscess without bleeding: Secondary | ICD-10-CM | POA: Diagnosis not present

## 2020-06-14 DIAGNOSIS — R634 Abnormal weight loss: Secondary | ICD-10-CM | POA: Diagnosis not present

## 2020-06-14 DIAGNOSIS — I700 Atherosclerosis of aorta: Secondary | ICD-10-CM | POA: Diagnosis not present

## 2020-06-27 DIAGNOSIS — N183 Chronic kidney disease, stage 3 unspecified: Secondary | ICD-10-CM | POA: Diagnosis not present

## 2020-06-27 DIAGNOSIS — Z299 Encounter for prophylactic measures, unspecified: Secondary | ICD-10-CM | POA: Diagnosis not present

## 2020-06-27 DIAGNOSIS — J449 Chronic obstructive pulmonary disease, unspecified: Secondary | ICD-10-CM | POA: Diagnosis not present

## 2020-06-27 DIAGNOSIS — I10 Essential (primary) hypertension: Secondary | ICD-10-CM | POA: Diagnosis not present

## 2020-06-27 DIAGNOSIS — I739 Peripheral vascular disease, unspecified: Secondary | ICD-10-CM | POA: Diagnosis not present

## 2020-07-06 DIAGNOSIS — E1165 Type 2 diabetes mellitus with hyperglycemia: Secondary | ICD-10-CM | POA: Diagnosis not present

## 2020-07-06 DIAGNOSIS — I10 Essential (primary) hypertension: Secondary | ICD-10-CM | POA: Diagnosis not present

## 2020-07-11 NOTE — Progress Notes (Signed)
Cardiology Office Note  Date: 07/12/2020   ID: [name], DOB 11-18-1950, MRN [id number]  PCP:  [name], MD  Cardiologist:  Rozann Lesches, MD Electrophysiologist:  None   Chief Complaint: Recently told to be seen by his primary care provider.  History of Present Illness: [name] is a 70 y.o. male with a history of COPD, CAD, DVT, HTN, HLD, PAD, RA, DM2, carotid artery disease, cervical spine disease, DVT.  He was last seen by Dr. Domenic Polite on 03/06/2020 for routine follow-up visit.  He was doing fairly well from a cardiac perspective.  He was going to [employer] almost daily.  He had gone to [facility] ER on February 24 with right axillary discomfort.  He was worried about his heart and symptoms were descriptive of musculoskeletal issue.  Symptoms were getting better with a hot pad.  EKG from that visit showed sinus rhythm with PACs.  He was following with Dr. Carlis Abbott vein and vascular for management of PAD.  Recent Doppler studies indicated moderate stenosis involving the right SFA with patent common femoral to AK popliteal bypass on the left.  Angiography was pending.  LDL was 87 in November 2021.  Lipitor was increased to 80 mg daily.  His blood pressure was normal that day and there were no changes made.  He was continuing his current cardiac regimen including aspirin, Lipitor, Imdur, losartan and as needed nitroglycerin.   He presents today stating his primary care provider told him to come to the cardiologist for neck pain.  Patient has significant history of neck injury, shoulder injury, left arm injury from previous accident.  He complains of left neck pain left arm pain and numbness.  He has had previous surgery on his neck by Dr. Joya Salm.  He has carotid artery disease and recently had a carotid artery study at [facility] which showed some moderate disease in RICA.  He sees vein and vascular for peripheral arterial disease.  He has had previous femoropopliteal  bypass, iliac artery stent, states he is having some unexplained weight loss.  He states he used to weigh in the 175 pound range.  States she is now down to 157.  He states he goes to the gym several times a week for exercise secondary to his lower extremity vascular disease.  He states he is eating well and has no issues with appetite.  He denies any gastrointestinal issues.  Denies any blood in stool or black tarry stools.  Denies any abdominal pain/epigastric pain.  Denies any constipation or diarrhea.  Blood pressure is well managed.  Heart rate is 93.  States his diabetes is well managed.  Has a history of COPD from smoking in the past.  He denies any anginal or exertional symptoms, palpitations or arrhythmias, orthostatic symptoms, CVA or TIA-like symptoms, PND, orthopnea denies any significant claudication issues, DVT or PE-like issues or lower extremity edema.  Past Medical History:  Diagnosis Date   Arthritis    RA   Bronchitis    Carotid artery disease ([place])    Cervical spine disease    Neck surgery in 2007 with Dr. Joya Salm   COPD (chronic obstructive pulmonary disease) ([place])    Coronary atherosclerosis of native coronary artery    Nonobstrtuctive by catheterization 2007, 2013, 2021   DVT (deep venous thrombosis) ([place])    Essential hypertension    GERD (gastroesophageal reflux disease)    Hyperlipidemia    PAD (peripheral artery disease) ([place])  s/p left fem-pop bypass 2007; PTA and stenting of right iliac artery 09/2011   Rheumatoid arthritis(714.0)    Type 2 diabetes mellitus [facility])     Past Surgical History:  Procedure Laterality Date   ABDOMINAL AORTAGRAM N/A 10/03/2011   Procedure: ABDOMINAL Maxcine Ham;  Surgeon: Conrad Redkey, MD;  Location: [facility] CATH LAB;  Service: Cardiovascular;  Laterality: N/A;   ABDOMINAL AORTOGRAM W/LOWER EXTREMITY N/A 01/16/2017   Procedure: ABDOMINAL AORTOGRAM W/LOWER EXTREMITY;  Surgeon: Conrad Denton, MD;  Location: [place] CV LAB;  Service:  Cardiovascular;  Laterality: N/A;   ABDOMINAL AORTOGRAM W/LOWER EXTREMITY Bilateral 08/04/2019   Procedure: ABDOMINAL AORTOGRAM W/LOWER EXTREMITY;  Surgeon: Marty Heck, MD;  Location: [place] CV LAB;  Service: Cardiovascular;  Laterality: Bilateral;   ABDOMINAL AORTOGRAM W/LOWER EXTREMITY N/A 03/09/2020   Procedure: ABDOMINAL AORTOGRAM W/LOWER EXTREMITY;  Surgeon: Marty Heck, MD;  Location: [place] CV LAB;  Service: Cardiovascular;  Laterality: N/A;   CERVICAL DISC SURGERY     COLONOSCOPY WITH PROPOFOL N/A 02/17/2017   Procedure: COLONOSCOPY WITH PROPOFOL;  Surgeon: Daneil Dolin, MD;  Location[facility];  Service: Endoscopy;  Laterality: N/A;  2:45pm   FEMORAL-POPLITEAL BYPASS GRAFT  11/06/2005   ILIAC ARTERY STENT  10-03-11   Right CIA stenting   LEFT HEART CATH AND CORONARY ANGIOGRAPHY N/A 04/14/2019   Procedure: LEFT HEART CATH AND CORONARY ANGIOGRAPHY;  Surgeon: Troy Sine, MD;  Location: [place] CV LAB;  Service: Cardiovascular;  Laterality: N/A;   PERIPHERAL VASCULAR BALLOON ANGIOPLASTY Right 01/16/2017   Procedure: PERIPHERAL VASCULAR BALLOON ANGIOPLASTY;  Surgeon: Conrad Chinchilla, MD;  Location: [place] CV LAB;  Service: Cardiovascular;  Laterality: Right;  common iliac    PERIPHERAL VASCULAR BALLOON ANGIOPLASTY  03/09/2020   Procedure: PERIPHERAL VASCULAR BALLOON ANGIOPLASTY;  Surgeon: Marty Heck, MD;  Location: [place] CV LAB;  Service: Cardiovascular;;   PR VEIN BYPASS GRAFT,AORTO-FEM-POP  06-22-2010   Redo Left Fem-Pop   Repair of left arm fracture     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     atorvastatin (LIPITOR) 80 MG tablet Take 1 tablet (80 mg total) by mouth daily. 90 tablet 1   bismuth subsalicylate (PEPTO BISMOL) 262 MG/15ML suspension Take 30 mLs by mouth  every 6 (six) hours as needed for indigestion or diarrhea or loose stools.     budesonide-formoterol (SYMBICORT) 160-4.5 MCG/ACT inhaler INHALE 2 PUFFS BY MOUTH TWICE A DAY RINSE MOUTH AFTER USE. (Patient taking differently: Inhale 2 puffs into the lungs in the morning and at bedtime.) 10.2 g 5   cilostazol (PLETAL) 100 MG tablet TAKE 1 TABLET(100 MG) BY MOUTH TWICE DAILY (Patient taking differently: Take 100 mg by mouth 2 (two) times daily.) 60 tablet 5   clopidogrel (PLAVIX) 75 MG tablet Take 1 tablet (75 mg total) by mouth daily. 30 tablet 11   Dexlansoprazole (DEXILANT) 30 MG capsule Take 30- 60 min before your first and last meals of the day (Patient taking differently: Take 30 mg by mouth daily.) 60 capsule 11   diclofenac Sodium (VOLTAREN) 1 % GEL Apply 2 g topically 4 (four) times daily as needed (Pain).     docusate sodium (COLACE) 100 MG capsule Take 100 mg by mouth daily as needed for mild constipation  or moderate constipation.      HYDROcodone-acetaminophen (NORCO) 7.5-325 MG per tablet Take 1 tablet by mouth 2 (two) times daily as needed for moderate pain.     ibuprofen (ADVIL) 200 MG tablet Take 400 mg by mouth every 6 (six) hours as needed for headache or moderate pain.     JANUVIA 100 MG tablet Take 100 mg by mouth daily.     JARDIANCE 25 MG TABS tablet Take 25 mg by mouth daily.     losartan (COZAAR) 25 MG tablet Take 25 mg by mouth daily.     metFORMIN (GLUCOPHAGE) 1000 MG tablet Take 1,000 mg by mouth 2 (two) times daily with a meal.     Misc Natural Products (TURMERIC CURCUMIN) CAPS Take 1 capsule by mouth daily as needed (pain).     Multiple Vitamins-Minerals (AIRBORNE PO) Take 1 tablet by mouth daily.     nitroGLYCERIN (NITROSTAT) 0.4 MG SL tablet Place 1 tablet (0.4 mg total) under the tongue every 5 (five) minutes as needed for chest pain. 25 tablet 3   ONE TOUCH ULTRA TEST test strip      OneTouch Delica Lancets 55D MISC Apply topically.     PROAIR HFA 108 (90 BASE) MCG/ACT  inhaler Inhale 2 puffs into the lungs every 6 (six) hours as needed for wheezing or shortness of breath.      sildenafil (REVATIO) 20 MG tablet Take 40-60 mg by mouth daily as needed for erectile dysfunction.     Tiotropium Bromide Monohydrate (SPIRIVA RESPIMAT) 2.5 MCG/ACT AERS Inhale 2 puffs into the lungs daily. 4 g 11   XELJANZ 5 MG TABS TAKE 1 TABLET BY MOUTH DAILY 90 tablet 0   isosorbide mononitrate (IMDUR) 30 MG 24 hr tablet Take 1 tablet (30 mg total) by mouth daily. (Patient not taking: Reported on 07/12/2020) 30 tablet 6   No current facility-administered medications for this visit.   Allergies:  Patient has no known allergies.   Social History: The patient  reports that he quit smoking about 8 years ago. His smoking use included cigarettes and cigars. He has a 45.00 pack-year smoking history. He has never used smokeless tobacco. He reports that he does not drink alcohol and does not use drugs.   Family History: The patient's family history includes Cancer in his brother and son; Deep vein thrombosis in his brother and mother; Diabetes in his brother, daughter, father, mother, and sister; Heart attack (age of onset: 93) in his mother; Hyperlipidemia in his brother, father, mother, sister, and son; Hypertension in his brother, father, mother, sister, and son; Peripheral vascular disease in his brother, father, and mother; Stroke in his father.   ROS:  Please see the history of present illness. Otherwise, complete review of systems is positive for none.  All other systems are reviewed and negative.   Physical Exam: VS:  BP (!) 112/56   Pulse 93   Ht 5\' 9"  (1.753 m)   Wt 157 lb (71.2 kg)   SpO2 95%   BMI 23.18 kg/m , BMI Body mass index is 23.18 kg/m.  Wt Readings from Last 3 Encounters:  07/12/20 157 lb (71.2 kg)  05/05/20 164 lb (74.4 kg)  04/12/20 164 lb (74.4 kg)    General: Patient appears comfortable at rest. Neck: Supple, no elevated JVP or carotid bruits, no  thyromegaly. Lungs: Clear to auscultation, nonlabored breathing at rest. Cardiac: Regular rate and rhythm, no S3 or significant systolic murmur, no pericardial rub. Extremities: No pitting edema,  distal pulses 2+. Skin: Warm and dry. Musculoskeletal: No kyphosis. Neuropsychiatric: Alert and oriented x3, affect grossly appropriate.  ECG:    Recent Labwork: 01/27/2020: ALT 17; AST 17; Platelets 326 03/09/2020: BUN 18; Creatinine, Ser 0.90; Hemoglobin 13.6; Potassium 4.1; Sodium 141     Component Value Date/Time   CHOL 148 05/05/2020 0000   CHOL 160 11/09/2019 1032   TRIG 49 05/05/2020 0000   HDL 63 05/05/2020 0000   HDL 61 11/09/2019 1032   CHOLHDL 2.3 05/05/2020 0000   LDLCALC 71 05/05/2020 0000    Other Studies Reviewed Today:  Carotid artery duplex study 05/24/2020 IMPRESSION: Bilateral carotid atherosclerosis. No hemodynamically significant ICA stenosis. Degree of narrowing less than 50% bilaterally by ultrasound criteria.   Cardiac catheterization 04/14/2019: Prox LAD to Mid LAD lesion is 20% stenosed. Mid LAD lesion is 30% stenosed. Prox RCA lesion is 30% stenosed.   Mild non-obstructive CAD with 20 and 30% irregularities in the proximal to mid LAD; normal ramus intermediate and left circumflex coronary arteries;  and RCA with smooth 30% proximal narrowing with distal vessel tortuosity.   Low normal global LV function with EF estimated at 50%; LVEDP 14 mmHg.     Assessment and Plan:  1. Coronary artery disease involving native coronary artery of native heart with angina pectoris ([place])   2. Mixed hyperlipidemia   3. PAD (peripheral artery disease) ([place])   4. Essential hypertension   5. Weight loss    1. Coronary artery disease involving native coronary artery of native heart with angina pectoris ([facility]) No anginal or exertional symptoms.  Continue aspirin 81 mg daily, Plavix 75 mg daily, sublingual nitroglycerin as needed, Imdur 30 mg daily.  Complaining of some neck  pain.  No classic anginal symptoms.  Recent cardiac catheterization showed mild nonobstructive disease on 04/14/2019  2. Mixed hyperlipidemia Continue atorvastatin 80 mg daily.  3. PAD (peripheral artery disease) ([place]) History of femoropopliteal bypass and iliac stent.  He denies any current claudication-like symptoms.  He states he goes to the gym several days a week and walks to help with his PAD.  Continue cilostazol 100 mg p.o. twice daily.  Follow with vein and vascular.  4. Essential hypertension Blood pressure well controlled at 112/56 today.  Continue losartan 25 mg daily.  5.  Weight loss Advised him to speak with his PCP regarding weight loss.  Patient states at 1 point earlier in the year he weighed approximately 175.  He states today he weighs 157.  He states he is not actively trying to lose weight and is concerned about weight loss.  6.  Neck pain He complains of neck pain and left arm tingling.  He had a previous lawnmower accident which injured his neck, arms and knees had surgery on both.  Advised him to speak with Dr. Joya Salm concerning his neck pain and arm tingling.  I advised him this sounds like a neurological issue.  He recently had a carotid study at [facility] with some mild disease in bilateral ICAs less than 50%.  He sees vein and vascular.   Medication Adjustments/Labs and Tests Ordered: Current medicines are reviewed at length with the patient today.  Concerns regarding medicines are outlined above.   Disposition: Follow-up with Domenic Polite or APP 6 months  Signed, Levell July, NP 07/12/2020 11:44 AM    [facility] [place], [place], [place] [address] Phone: [phone]; Fax: [phone]

## 2020-07-12 ENCOUNTER — Ambulatory Visit (INDEPENDENT_AMBULATORY_CARE_PROVIDER_SITE_OTHER): Payer: Medicare Other | Admitting: Family Medicine

## 2020-07-12 ENCOUNTER — Encounter: Payer: Self-pay | Admitting: Family Medicine

## 2020-07-12 VITALS — BP 112/56 | HR 93 | Ht 69.0 in | Wt 157.0 lb

## 2020-07-12 DIAGNOSIS — I10 Essential (primary) hypertension: Secondary | ICD-10-CM

## 2020-07-12 DIAGNOSIS — I25119 Atherosclerotic heart disease of native coronary artery with unspecified angina pectoris: Secondary | ICD-10-CM | POA: Diagnosis not present

## 2020-07-12 DIAGNOSIS — E782 Mixed hyperlipidemia: Secondary | ICD-10-CM | POA: Diagnosis not present

## 2020-07-12 DIAGNOSIS — I739 Peripheral vascular disease, unspecified: Secondary | ICD-10-CM

## 2020-07-12 DIAGNOSIS — R634 Abnormal weight loss: Secondary | ICD-10-CM | POA: Diagnosis not present

## 2020-07-12 NOTE — Patient Instructions (Signed)
Medication Instructions:  Your physician recommends that you continue on your current medications as directed. Please refer to the Current Medication list given to you today.  *If you need a refill on your cardiac medications before your next appointment, please call your pharmacy*   Lab Work: None If you have labs (blood work) drawn today and your tests are completely normal, you will receive your results only by: [place] (if you have MyChart) OR A paper copy in the mail If you have any lab test that is abnormal or we need to change your treatment, we will call you to review the results.   Testing/Procedures: None   Follow-Up: At [facility], you and your health needs are our priority.  As part of our continuing mission to provide you with exceptional heart care, we have created designated Provider Care Teams.  These Care Teams include your primary Cardiologist (physician) and Advanced Practice Providers (APPs -  Physician Assistants and Nurse Practitioners) who all work together to provide you with the care you need, when you need it.  We recommend signing up for the patient portal called "MyChart".  Sign up information is provided on this After Visit Summary.  MyChart is used to connect with patients for Virtual Visits (Telemedicine).  Patients are able to view lab/test results, encounter notes, upcoming appointments, etc.  Non-urgent messages can be sent to your provider as well.   To learn more about what you can do with MyChart, go to [email or web address].    Your next appointment:   6 month(s)  The format for your next appointment:   In Person  Provider:   Katina Dung, NP   Other Instructions

## 2020-07-18 ENCOUNTER — Encounter (HOSPITAL_COMMUNITY): Payer: Medicare Other

## 2020-07-18 ENCOUNTER — Ambulatory Visit: Payer: Medicare Other

## 2020-07-20 DIAGNOSIS — Z79899 Other long term (current) drug therapy: Secondary | ICD-10-CM | POA: Diagnosis not present

## 2020-07-20 DIAGNOSIS — M5412 Radiculopathy, cervical region: Secondary | ICD-10-CM | POA: Diagnosis not present

## 2020-07-20 DIAGNOSIS — M4802 Spinal stenosis, cervical region: Secondary | ICD-10-CM | POA: Insufficient documentation

## 2020-07-20 DIAGNOSIS — J449 Chronic obstructive pulmonary disease, unspecified: Secondary | ICD-10-CM | POA: Diagnosis not present

## 2020-07-28 DIAGNOSIS — I10 Essential (primary) hypertension: Secondary | ICD-10-CM | POA: Diagnosis not present

## 2020-07-28 DIAGNOSIS — E1122 Type 2 diabetes mellitus with diabetic chronic kidney disease: Secondary | ICD-10-CM | POA: Diagnosis not present

## 2020-07-28 DIAGNOSIS — I25119 Atherosclerotic heart disease of native coronary artery with unspecified angina pectoris: Secondary | ICD-10-CM | POA: Diagnosis not present

## 2020-07-28 DIAGNOSIS — Z299 Encounter for prophylactic measures, unspecified: Secondary | ICD-10-CM | POA: Diagnosis not present

## 2020-07-28 DIAGNOSIS — I739 Peripheral vascular disease, unspecified: Secondary | ICD-10-CM | POA: Diagnosis not present

## 2020-07-28 DIAGNOSIS — E1165 Type 2 diabetes mellitus with hyperglycemia: Secondary | ICD-10-CM | POA: Diagnosis not present

## 2020-07-31 ENCOUNTER — Other Ambulatory Visit: Payer: Self-pay

## 2020-07-31 ENCOUNTER — Ambulatory Visit (INDEPENDENT_AMBULATORY_CARE_PROVIDER_SITE_OTHER): Payer: Medicare Other | Admitting: Physician Assistant

## 2020-07-31 ENCOUNTER — Encounter: Payer: Self-pay | Admitting: Physician Assistant

## 2020-07-31 ENCOUNTER — Ambulatory Visit (HOSPITAL_COMMUNITY)
Admission: RE | Admit: 2020-07-31 | Discharge: 2020-07-31 | Disposition: A | Discharge status: Home / Self Care | Payer: Medicare Other | Source: Ambulatory Visit | Attending: Surgery | Admitting: Surgery

## 2020-07-31 VITALS — BP 117/69 | HR 84 | Temp 97.6°F | Resp 20 | Ht 69.0 in | Wt 161.2 lb

## 2020-07-31 DIAGNOSIS — I739 Peripheral vascular disease, unspecified: Secondary | ICD-10-CM | POA: Insufficient documentation

## 2020-07-31 DIAGNOSIS — I70213 Atherosclerosis of native arteries of extremities with intermittent claudication, bilateral legs: Secondary | ICD-10-CM | POA: Diagnosis not present

## 2020-07-31 DIAGNOSIS — Z95828 Presence of other vascular implants and grafts: Secondary | ICD-10-CM | POA: Diagnosis not present

## 2020-07-31 NOTE — Progress Notes (Signed)
Office Note     CC:  follow up Requesting Provider:  [name], MD  HPI: [name] is a 70 y.o. (05-Nov-1950) male who presents for routine surveillance of PAD.  Surgical history is significant for left common femoral to above-the-knee popliteal bypass in 2007.  He also has a right common iliac artery stent placed in 2013.  Most recently on March 09, 2020 he underwent right mid SFA angioplasty as well as angioplasty of proximal right ATA by Dr. Carlis Abbott.  This was performed due to third toe ulcer which has since healed.  He denies any further nonhealing wounds, rest pain, or claudication.  He remains active with gardening and going to the gym most mornings.  He denies tobacco use.  He is on aspirin, Plavix, statin, and Pletal daily.  Past medical history also significant for type 2 diabetes mellitus.   Past Medical History:  Diagnosis Date   Arthritis    RA   Bronchitis    Carotid artery disease ([place])    Cervical spine disease    Neck surgery in 2007 with Dr. Joya Salm   COPD (chronic obstructive pulmonary disease) ([place])    Coronary atherosclerosis of native coronary artery    Nonobstrtuctive by catheterization 2007, 2013, 2021   DVT (deep venous thrombosis) ([place])    Essential hypertension    GERD (gastroesophageal reflux disease)    Hyperlipidemia    PAD (peripheral artery disease) ([place])    s/p left fem-pop bypass 2007; PTA and stenting of right iliac artery 09/2011   Rheumatoid arthritis(714.0)    Type 2 diabetes mellitus ([place])     Past Surgical History:  Procedure Laterality Date   ABDOMINAL AORTAGRAM N/A 10/03/2011   Procedure: ABDOMINAL Maxcine Ham;  Surgeon: Conrad North Hodge, MD;  Location: [facility] CATH LAB;  Service: Cardiovascular;  Laterality: N/A;   ABDOMINAL AORTOGRAM W/LOWER EXTREMITY N/A 01/16/2017   Procedure: ABDOMINAL AORTOGRAM W/LOWER EXTREMITY;  Surgeon: Conrad Delta, MD;  Location: [place] CV LAB;  Service: Cardiovascular;  Laterality: N/A;   ABDOMINAL AORTOGRAM  W/LOWER EXTREMITY Bilateral 08/04/2019   Procedure: ABDOMINAL AORTOGRAM W/LOWER EXTREMITY;  Surgeon: Marty Heck, MD;  Location: [place] CV LAB;  Service: Cardiovascular;  Laterality: Bilateral;   ABDOMINAL AORTOGRAM W/LOWER EXTREMITY N/A 03/09/2020   Procedure: ABDOMINAL AORTOGRAM W/LOWER EXTREMITY;  Surgeon: Marty Heck, MD;  Location: [place] CV LAB;  Service: Cardiovascular;  Laterality: N/A;   CERVICAL DISC SURGERY     COLONOSCOPY WITH PROPOFOL N/A 02/17/2017   Procedure: COLONOSCOPY WITH PROPOFOL;  Surgeon: Daneil Dolin, MD;  Location[facility];  Service: Endoscopy;  Laterality: N/A;  2:45pm   FEMORAL-POPLITEAL BYPASS GRAFT  11/06/2005   ILIAC ARTERY STENT  10-03-11   Right CIA stenting   LEFT HEART CATH AND CORONARY ANGIOGRAPHY N/A 04/14/2019   Procedure: LEFT HEART CATH AND CORONARY ANGIOGRAPHY;  Surgeon: Troy Sine, MD;  Location: [place] CV LAB;  Service: Cardiovascular;  Laterality: N/A;   PERIPHERAL VASCULAR BALLOON ANGIOPLASTY Right 01/16/2017   Procedure: PERIPHERAL VASCULAR BALLOON ANGIOPLASTY;  Surgeon: Conrad [place], MD;  Location: [place] CV LAB;  Service: Cardiovascular;  Laterality: Right;  common iliac    PERIPHERAL VASCULAR BALLOON ANGIOPLASTY  03/09/2020   Procedure: PERIPHERAL VASCULAR BALLOON ANGIOPLASTY;  Surgeon: Marty Heck, MD;  Location: [place] CV LAB;  Service: Cardiovascular;;   PR VEIN BYPASS GRAFT,AORTO-FEM-POP  06-22-2010   Redo Left Fem-Pop   Repair of left arm fracture  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     SURGERY SCROTAL / TESTICULAR      Social History   Socioeconomic History   Marital status: Single    Spouse name: Not on file   Number of children: Not on file   Years of education: Not on file   Highest education level: Not on file  Occupational History   Not on file  Tobacco Use   Smoking status: Former    Packs/day: 1.00    Years: 45.00    Pack years: 45.00    Types: Cigarettes, Cigars     Quit date: 09/01/2011    Years since quitting: 8.9   Smokeless tobacco: Never  Vaping Use   Vaping Use: Never used  Substance and Sexual Activity   Alcohol use: No    Alcohol/week: 0.0 standard drinks   Drug use: No   Sexual activity: Not Currently    Birth control/protection: None  Other Topics Concern   Not on file  Social History Narrative   The patient lives in [place].    Social Determinants of Health   Financial Resource Strain: Not on file  Food Insecurity: Not on file  Transportation Needs: Not on file  Physical Activity: Not on file  Stress: Not on file  Social Connections: Not on file  Intimate Partner Violence: Not on file    Family History  Problem Relation Age of Onset   Heart attack Mother 23   Deep vein thrombosis Mother    Diabetes Mother    Hyperlipidemia Mother    Hypertension Mother    Peripheral vascular disease Mother    Stroke Father    Diabetes Father    Hyperlipidemia Father    Hypertension Father    Peripheral vascular disease Father    Diabetes Sister    Hyperlipidemia Sister    Hypertension Sister    Diabetes Brother    Hyperlipidemia Brother    Cancer Brother    Deep vein thrombosis Brother    Hypertension Brother    Peripheral vascular disease Brother    Hyperlipidemia Son    Cancer Son    Hypertension Son    Diabetes Daughter    Colon cancer Neg Hx     Current Outpatient Medications  Medication Sig Dispense Refill   aspirin EC 81 MG tablet Take 1 tablet (81 mg total) by mouth daily.     atorvastatin (LIPITOR) 20 MG tablet Take 20 mg by mouth daily.     bismuth subsalicylate (PEPTO BISMOL) 262 MG/15ML suspension Take 30 mLs by mouth every 6 (six) hours as needed for indigestion or diarrhea or loose stools.     budesonide-formoterol (SYMBICORT) 160-4.5 MCG/ACT inhaler INHALE 2 PUFFS BY MOUTH TWICE A DAY RINSE MOUTH AFTER USE. (Patient taking differently: Inhale 2 puffs into the lungs in the morning and at bedtime.) 10.2 g 5    cilostazol (PLETAL) 100 MG tablet TAKE 1 TABLET(100 MG) BY MOUTH TWICE DAILY (Patient taking differently: Take 100 mg by mouth 2 (two) times daily.) 60 tablet 5   clopidogrel (PLAVIX) 75 MG tablet Take 1 tablet (75 mg total) by mouth daily. 30 tablet 11   Dexlansoprazole (DEXILANT) 30 MG capsule Take 30- 60 min before your first and last meals of the day (Patient taking differently: Take 30 mg by mouth daily.) 60 capsule 11   diclofenac Sodium (VOLTAREN) 1 % GEL Apply 2 g topically 4 (four) times daily as needed (Pain).     docusate sodium (COLACE) 100 MG  capsule Take 100 mg by mouth daily as needed for mild constipation or moderate constipation.      HYDROcodone-acetaminophen (NORCO) 7.5-325 MG per tablet Take 1 tablet by mouth 2 (two) times daily as needed for moderate pain.     ibuprofen (ADVIL) 200 MG tablet Take 400 mg by mouth every 6 (six) hours as needed for headache or moderate pain.     JANUVIA 100 MG tablet Take 100 mg by mouth daily.     JARDIANCE 25 MG TABS tablet Take 25 mg by mouth daily.     losartan (COZAAR) 25 MG tablet Take 25 mg by mouth daily.     metFORMIN (GLUCOPHAGE) 1000 MG tablet Take 1,000 mg by mouth 2 (two) times daily with a meal.     Misc Natural Products (TURMERIC CURCUMIN) CAPS Take 1 capsule by mouth daily as needed (pain).     Multiple Vitamins-Minerals (AIRBORNE PO) Take 1 tablet by mouth daily.     nitroGLYCERIN (NITROSTAT) 0.4 MG SL tablet Place 1 tablet (0.4 mg total) under the tongue every 5 (five) minutes as needed for chest pain. 25 tablet 3   ONE TOUCH ULTRA TEST test strip      OneTouch Delica Lancets 99991111 MISC Apply topically.     PROAIR HFA 108 (90 BASE) MCG/ACT inhaler Inhale 2 puffs into the lungs every 6 (six) hours as needed for wheezing or shortness of breath.      sildenafil (REVATIO) 20 MG tablet Take 40-60 mg by mouth daily as needed for erectile dysfunction.     Tiotropium Bromide Monohydrate (SPIRIVA RESPIMAT) 2.5 MCG/ACT AERS Inhale 2 puffs  into the lungs daily. 4 g 11   XELJANZ 5 MG TABS TAKE 1 TABLET BY MOUTH DAILY 90 tablet 0   isosorbide mononitrate (IMDUR) 30 MG 24 hr tablet Take 1 tablet (30 mg total) by mouth daily. (Patient not taking: Reported on 07/12/2020) 30 tablet 6   No current facility-administered medications for this visit.    No Known Allergies   REVIEW OF SYSTEMS:   '[X]'$  denotes positive finding, '[ ]'$  denotes negative finding Cardiac  Comments:  Chest pain or chest pressure:    Shortness of breath upon exertion:    Short of breath when lying flat:    Irregular heart rhythm:        Vascular    Pain in calf, thigh, or hip brought on by ambulation:    Pain in feet at night that wakes you up from your sleep:     Blood clot in your veins:    Leg swelling:         Pulmonary    Oxygen at home:    Productive cough:     Wheezing:         Neurologic    Sudden weakness in arms or legs:     Sudden numbness in arms or legs:     Sudden onset of difficulty speaking or slurred speech:    Temporary loss of vision in one eye:     Problems with dizziness:         Gastrointestinal    Blood in stool:     Vomited blood:         Genitourinary    Burning when urinating:     Blood in urine:        Psychiatric    Major depression:         Hematologic    Bleeding problems:    Problems with  blood clotting too easily:        Skin    Rashes or ulcers:        Constitutional    Fever or chills:      PHYSICAL EXAMINATION:  Vitals:   07/31/20 0820  BP: 117/69  Pulse: 84  Resp: 20  Temp: 97.6 F (36.4 C)  TempSrc: Temporal  SpO2: 98%  Weight: 161 lb 3.2 oz (73.1 kg)  Height: '5\' 9"'$  (1.753 m)    General:  WDWN in NAD; vital signs documented above Gait: Not observed HENT: WNL, normocephalic Pulmonary: normal non-labored breathing Cardiac: regular HR Abdomen: soft, NT, no masses Skin: without rashes Vascular Exam/Pulses:  Right Left  Radial 2+ (normal) 2+ (normal)  DP 2+ (normal) 1+ (weak)   PT 1+ (weak) 1+ (weak)   Extremities: without ischemic changes, without Gangrene , without cellulitis; without open wounds;  Musculoskeletal: no muscle wasting or atrophy  Neurologic: A&O X 3;  No focal weakness or paresthesias are detected Psychiatric:  The pt has Normal affect.   Non-Invasive Vascular Imaging:   ABI/TBIToday's ABIToday's TBIPrevious ABIPrevious TBI  +-------+-----------+-----------+------------+------------+  Right  1.34       0.29                                 +-------+-----------+-----------+------------+------------+  Left   1.33       0.37          ASSESSMENT/PLAN:: 70 y.o. male here for follow up for routine surveillance of PAD  -Right third toe ulcer has since healed since recent angioplasty of right SFA and ATA -ABIs are unchanged with triphasic waveforms at the level of the ankle -Continue aspirin, statin, Plavix, Pletal daily -Continue active lifestyle -Recheck bilateral lower extremity arterial duplex and ABIs in 6 months; call/return office sooner with any questions or concerns   Dagoberto Ligas, PA-C [facility] MD:   Trula Slade

## 2020-08-02 ENCOUNTER — Encounter: Payer: Self-pay | Admitting: Podiatry

## 2020-08-02 ENCOUNTER — Other Ambulatory Visit: Payer: Self-pay | Admitting: *Deleted

## 2020-08-02 ENCOUNTER — Ambulatory Visit (INDEPENDENT_AMBULATORY_CARE_PROVIDER_SITE_OTHER): Payer: Medicare Other | Admitting: Podiatry

## 2020-08-02 ENCOUNTER — Telehealth: Payer: Self-pay | Admitting: Rheumatology

## 2020-08-02 ENCOUNTER — Other Ambulatory Visit: Payer: Self-pay

## 2020-08-02 ENCOUNTER — Telehealth: Payer: Self-pay

## 2020-08-02 DIAGNOSIS — I70213 Atherosclerosis of native arteries of extremities with intermittent claudication, bilateral legs: Secondary | ICD-10-CM

## 2020-08-02 DIAGNOSIS — Z79899 Other long term (current) drug therapy: Secondary | ICD-10-CM | POA: Diagnosis not present

## 2020-08-02 DIAGNOSIS — E1159 Type 2 diabetes mellitus with other circulatory complications: Secondary | ICD-10-CM | POA: Diagnosis not present

## 2020-08-02 DIAGNOSIS — M79674 Pain in right toe(s): Secondary | ICD-10-CM

## 2020-08-02 DIAGNOSIS — M79675 Pain in left toe(s): Secondary | ICD-10-CM | POA: Diagnosis not present

## 2020-08-02 DIAGNOSIS — L84 Corns and callosities: Secondary | ICD-10-CM

## 2020-08-02 DIAGNOSIS — B351 Tinea unguium: Secondary | ICD-10-CM

## 2020-08-02 NOTE — Telephone Encounter (Signed)
Lab Orders released.  

## 2020-08-02 NOTE — Telephone Encounter (Signed)
Patient at [facility] now on [facility]. Please release orders.

## 2020-08-02 NOTE — Telephone Encounter (Signed)
Patient called to let Dr. Estanislado Pandy know that he had his labwork this morning.  Patient requested a return call when the results are available.

## 2020-08-03 LAB — CBC WITH DIFFERENTIAL/PLATELET
Basophils Absolute: 0.1 10*3/uL (ref 0.0–0.2)
Basos: 1 %
EOS (ABSOLUTE): 0.1 10*3/uL (ref 0.0–0.4)
Eos: 1 %
Hematocrit: 40.6 % (ref 37.5–51.0)
Hemoglobin: 12.6 g/dL — ABNORMAL LOW (ref 13.0–17.7)
Immature Grans (Abs): 0 10*3/uL (ref 0.0–0.1)
Immature Granulocytes: 0 %
Lymphocytes Absolute: 2 10*3/uL (ref 0.7–3.1)
Lymphs: 25 %
MCH: 26.7 pg (ref 26.6–33.0)
MCHC: 31 g/dL — ABNORMAL LOW (ref 31.5–35.7)
MCV: 86 fL (ref 79–97)
Monocytes Absolute: 0.5 10*3/uL (ref 0.1–0.9)
Monocytes: 6 %
Neutrophils Absolute: 5.5 10*3/uL (ref 1.4–7.0)
Neutrophils: 67 %
Platelets: 335 10*3/uL (ref 150–450)
RBC: 4.72 x10E6/uL (ref 4.14–5.80)
RDW: 17 % — ABNORMAL HIGH (ref 11.6–15.4)
WBC: 8.1 10*3/uL (ref 3.4–10.8)

## 2020-08-03 LAB — CMP14+EGFR
ALT: 14 IU/L (ref 0–44)
AST: 21 IU/L (ref 0–40)
Albumin/Globulin Ratio: 1.7 (ref 1.2–2.2)
Albumin: 4.5 g/dL (ref 3.8–4.8)
Alkaline Phosphatase: 83 IU/L (ref 44–121)
BUN/Creatinine Ratio: 15 (ref 10–24)
BUN: 16 mg/dL (ref 8–27)
Bilirubin Total: 0.3 mg/dL (ref 0.0–1.2)
CO2: 24 mmol/L (ref 20–29)
Calcium: 9.6 mg/dL (ref 8.6–10.2)
Chloride: 103 mmol/L (ref 96–106)
Creatinine, Ser: 1.07 mg/dL (ref 0.76–1.27)
Globulin, Total: 2.7 g/dL (ref 1.5–4.5)
Glucose: 100 mg/dL — ABNORMAL HIGH (ref 65–99)
Potassium: 4.5 mmol/L (ref 3.5–5.2)
Sodium: 142 mmol/L (ref 134–144)
Total Protein: 7.2 g/dL (ref 6.0–8.5)
eGFR: 75 mL/min/{1.73_m2} (ref >59)

## 2020-08-03 NOTE — Progress Notes (Signed)
CMP is normal, CBC shows mild anemia.  We will continue to monitor labs.

## 2020-08-04 DIAGNOSIS — E1165 Type 2 diabetes mellitus with hyperglycemia: Secondary | ICD-10-CM | POA: Diagnosis not present

## 2020-08-04 DIAGNOSIS — I10 Essential (primary) hypertension: Secondary | ICD-10-CM | POA: Diagnosis not present

## 2020-08-05 NOTE — Progress Notes (Signed)
  Subjective:  Patient ID: [name], male    DOB: [birth year],  MRN: [id number]  Nation Kackley presents to clinic today for at risk foot care. Pt has h/o NIDDM with PAD and callus(es) b/l feet and painful thick toenails that are difficult to trim. Painful toenails interfere with ambulation. Aggravating factors include wearing enclosed shoe gear. Pain is relieved with periodic professional debridement. Painful calluses are aggravated when weightbearing with and without shoegear. Pain is relieved with periodic professional debridement.  He has seen Vascular team and states everything is better now. He has no claudication symptoms. Patient states he continues to walk daily for exercise.  Patient states blood glucose was 130 mg/dl today.  PCP is [name], MD , and last visit was 03/27/2020.Marland Kitchen  No Known Allergies  Review of Systems: Negative except as noted in the HPI. Objective:   Constitutional Arleigh Greenia is a pleasant 70 y.o. African American male, WD, WN in NAD. AAO x 3.   Vascular Capillary fill time to digits <3 seconds b/l lower extremities. Faintly palpable DP pulse(s) b/l lower extremities. Nonpalpable PT pulse(s) b/l lower extremities. Pedal hair absent. Lower extremity skin temperature gradient within normal limits. No pain with calf compression b/l. No edema noted b/l lower extremities. No ischemia or gangrene noted b/l lower extremities. No cyanosis or clubbing noted.  Neurologic Normal speech. Oriented to person, place, and time. Pt has subjective symptoms of neuropathy. Protective sensation intact 5/5 intact bilaterally with 10g monofilament b/l.  Dermatologic No open wounds b/l lower extremities. No interdigital macerations b/l lower extremities. Toenails 1-5 b/l elongated, discolored, dystrophic, thickened, crumbly with subungual debris and tenderness to dorsal palpation. Hyperkeratotic lesion(s) L hallux and submet head 3 right foot.  No erythema, no edema, no drainage, no  fluctuance.  Orthopedic: Normal muscle strength 5/5 to all lower extremity muscle groups bilaterally. No pain crepitus or joint limitation noted with ROM b/l lower extremities. Hallux valgus with bunion deformity noted b/l lower extremities.   Radiographs: None  ABIs 04/12/2020:  Right: 0.94 Left: 1.32  TBIs: Right: 0.26 Left:  0.47 Assessment:   1. Pain due to onychomycosis of toenails of both feet   2. Callus   3. Type 2 diabetes mellitus with vascular disease ([place])    Plan:  -Examined patient. -Continue diabetic foot care principles. -Patient to continue soft, supportive shoe gear daily. -Toenails 1-5 b/l were debrided in length and girth with sterile nail nippers and dremel without iatrogenic bleeding.  -Callus(es) L hallux and submet head 3 right foot pared utilizing sterile scalpel blade without complication or incident. Total number debrided =2. -Patient to report any pedal injuries to medical professional immediately. -Patient/POA to call should there be question/concern in the interim.  Return in about 3 months (around 11/02/2020).  Marzetta Board, DPM

## 2020-08-06 DIAGNOSIS — E1165 Type 2 diabetes mellitus with hyperglycemia: Secondary | ICD-10-CM | POA: Diagnosis not present

## 2020-08-06 DIAGNOSIS — I10 Essential (primary) hypertension: Secondary | ICD-10-CM | POA: Diagnosis not present

## 2020-08-09 ENCOUNTER — Other Ambulatory Visit: Payer: Self-pay

## 2020-08-09 ENCOUNTER — Ambulatory Visit (INDEPENDENT_AMBULATORY_CARE_PROVIDER_SITE_OTHER): Payer: Medicare Other | Admitting: Internal Medicine

## 2020-08-09 ENCOUNTER — Encounter: Payer: Self-pay | Admitting: Internal Medicine

## 2020-08-09 ENCOUNTER — Ambulatory Visit (INDEPENDENT_AMBULATORY_CARE_PROVIDER_SITE_OTHER): Payer: Medicare Other

## 2020-08-09 DIAGNOSIS — J449 Chronic obstructive pulmonary disease, unspecified: Secondary | ICD-10-CM

## 2020-08-09 DIAGNOSIS — R079 Chest pain, unspecified: Secondary | ICD-10-CM | POA: Diagnosis not present

## 2020-08-09 DIAGNOSIS — J439 Emphysema, unspecified: Secondary | ICD-10-CM | POA: Diagnosis not present

## 2020-08-09 NOTE — Patient Instructions (Signed)
Classic subdiaphragmatic pain pattern suggests ibs:  very limited distribution of pain locations, daytime, not usually exacerbated by exercise  or coughing, worse in sitting position, frequently associated with generalized abd bloating   Treatment consists of avoiding foods that cause gas (especially boiled eggs, mexcican food but especially  beans and undercooked vegetables like  spinach and some salads)  and citrucel 1 heaping tsp twice daily with a large glass of water.  Pain should improve w/in 2 weeks and if not then consider further GI work up.     We will walk you today to evaluate your breathing  Please remember to go to the  x-ray department  for your tests - we will call you with the results when they are available    Please schedule a follow up office visit in 4 weeks, sooner if needed  with all medications /inhalers/ solutions in hand so we can verify exactly what you are taking. This includes all medications from all doctors and over the counters

## 2020-08-09 NOTE — Assessment & Plan Note (Addendum)
Quit smoking 2013 PFT's 01/04/15  FEV1 2.15 (71 % ) ratio 58  p 14 % improvement from saba p ? prior to study with DLCO  70 % corrects to 94  % for alv volume  With only mild curvature f/v loop  - Spirometry 01/28/2017  FEV1 1.59 (53%)  Ratio 59 off all rx  - 01/28/2017    symb 160 2bid and d/c acei and double the dexilant > marked improvement 02/13/2017  PFT's  04/04/2017  FEV1 1.99 (68 % ) ratio 62  p 23 % improvement from saba p nothing prior to study with DLCO  63 % corrects to 82  % for alv volume   - 04/04/2017  After extensive coaching inhaler device  effectiveness =    90%  - 11/04/2017  Walked RA x 3 laps @ 185 ft each stopped due to  End of study, fast pace, no  Desat, mild sob  - 11/04/2017  After extensive coaching inhaler device,  effectiveness =    75% > restart symb 160 2bid - Spirometry 11/04/2017  FEV1 1.4 (47%)  Ratio 58 off all rx     - 05/20/2018  After extensive coaching inhaler device,  effectiveness = 75% with symb and 90% with SMI > try add spiriva 2 pffs each am   - 05/20/2018   Walked RA  2 laps @  approx 243f each @ fast pace  stopped due to  End of study, no cp or sob or desats    -  08/09/2020   Walked RA  3 laps @ approx 2552feach @ fast pace  stopped due to end of study, min  sob, sats 96% at end     Group D in terms of symptom/risk and laba/lama/ICS  therefore appropriate rx at this point >>>  Continue symb/spiriva and prn saba > well compensated at present, encouraged by regular ex regimen.  - The proper method of use, as well as anticipated side effects, of a metered-dose inhaler were discussed and demonstrated to the patient using teach back method.

## 2020-08-09 NOTE — Progress Notes (Signed)
Subjective:    Patient ID: [name], male   DOB: 1950/01/08    MRN: [id number]    Brief patient profile:  2  yobm quit smoking 2013 with dx of copd GOLD II with reversibility on prn inhalers didn't really use it much and "worse over the years" and placed on BREO but worse fall  2018 so placed on Trelegy and referred to pulmonary clinic 01/28/2017 by Dr   [name]      History of Present Illness  01/28/2017 1st [facility] office visit/ [name]  ? Copd   Chief Complaint  Patient presents with   Pulmonary Consult    Referred by Dr. [name] for eval of COPD. Pt states that he has been having SOB for the past several yrs, worse for the past 3-6 months. He gets SOB with walking short distances and up and down stairs. He has CP that comes and goes. He also has some cough with white sputum.  He is using proair once daily on average.   doe x years much worse x sev months, cp is not related to breathing or exertion localized L ant chest/ lasts just a few secs Cough is worse since sob worse/ esp hs and early in am Baseline = 15 min walk outside mostly flat but struggles if gets in a hurry or steps  Thinks proair helps his cp but note resolves in 2 secs whether uses proair or not rec Continue the dexilant but for now take it Take 30- 60 min before your first and last meals of the day until cough and breathing better then just Take 30-60 min before first meal of the day  Stop Trelegy and lisinopril Losartan 50 mg only daily takes the place of lisinopril  Plan A = Automatic = symbicort 160 Take 2 puffs first thing in am and then another 2 puffs about 12 hours later.  Work on inhaler technique:   Plan B = Backup Only use your albuterol as a rescue medication  05/20/2018 acute extended  ov/[name] re: copd/ recurrent cp x one month/ did not bring meds as requested   Chief Complaint  Patient presents with   Acute Visit    Pt c/o left side CP and increased SOB x 1 month. He has had muscle aches for the  past 6 wks.   Dyspnea:  Walks around park x 15 min daily never tries inhaler before walks, sob stops him and legs hurt but no cp with exertion  Cough: no Sleeping: on side bed is flat two pillows SABA use: bid avg  02: none  Cp comes and goes x years 2-3 min, always in same place / never supine/ did not follow IBS recs rec Plan A = Automatic = symbicort 160 Take 2 puffs first thing in am and then another 2 puffs about 12 hours later.  Add spiriva 2 pffs each am only  Plan B = Backup Only use your albuterol as a rescue medication  Classic   pain pattern suggests ibs rx citrucel/diet        02/09/2019  f/u ov/[name] re:  GOLD II/ brought meds ./ symb/spiriva maint  Chief Complaint  Patient presents with   Follow-up    Breathing is about the same. He rarely uses his albuterol inhaler. No new co's.    Dyspnea:  MMRC2 = can't walk a nl pace on a flat grade s sob but does fine slow and flat / does stationery bike stops p 5 minutes sets  resistance high  Cough: no Sleeping: ok SABA use: rare 02: none   Rec Increase aerobic activity   08/09/2020  f/u ov/[name] re: GOLD 2 copd maint on  symbicort /spiriva  Chief Complaint  Patient presents with   Follow-up    Pt c/o unintentional wt loss and pain on his left side "for a good while".  His breathing is unchanged.    Dyspnea:  working out at gym x 15 min x ? Speed  and ex bike =resistance at 3 and ? Speed x 5-8 min and denies sob limiting  or any ex cp Cough: sporadic  Sleeping: bed is flat / months  SABA use: not using  02: none  Covid status:   vax x 3      No obvious day to day or daytime variability or assoc excess/ purulent sputum or mucus plugs or hemoptysis or  chest tightness, subjective wheeze or overt sinus or hb symptoms.   Sleeping  without nocturnal  or early am exacerbation  of respiratory  c/o's or need for noct saba. Also denies any obvious fluctuation of symptoms with weather or environmental changes or other aggravating or  alleviating factors except as outlined above   No unusual exposure hx or h/o childhood pna/ asthma or knowledge of premature birth.  Current Allergies, Complete Past Medical History, Past Surgical History, Family History, and Social History were reviewed in [employer] record.  ROS  The following are not active complaints unless bolded Hoarseness, sore throat, dysphagia, dental problems, itching, sneezing,  nasal congestion or discharge of excess mucus or purulent secretions, ear ache,   fever, chills, sweats, unintended wt loss or wt gain, classically pleuritic or exertional cp,  orthopnea pnd or arm/hand swelling  or leg swelling, presyncope, palpitations, abdominal pain "lots of gas)  anorexia, nausea, vomiting, diarrhea  or change in bowel habits or change in bladder habits, change in stools or change in urine, dysuria, hematuria,  rash, arthralgias, visual complaints, headache, numbness, weakness or ataxia or problems with walking or coordination,  change in mood or  memory.        Current Meds  Medication Sig   aspirin EC 81 MG tablet Take 1 tablet (81 mg total) by mouth daily.   atorvastatin (LIPITOR) 20 MG tablet Take 20 mg by mouth daily.   bismuth subsalicylate (PEPTO BISMOL) 262 MG/15ML suspension Take 30 mLs by mouth every 6 (six) hours as needed for indigestion or diarrhea or loose stools.   budesonide-formoterol (SYMBICORT) 160-4.5 MCG/ACT inhaler INHALE 2 PUFFS BY MOUTH TWICE A DAY RINSE MOUTH AFTER USE. (Patient taking differently: Inhale 2 puffs into the lungs in the morning and at bedtime.)   cilostazol (PLETAL) 100 MG tablet TAKE 1 TABLET(100 MG) BY MOUTH TWICE DAILY (Patient taking differently: Take 100 mg by mouth 2 (two) times daily.)   clopidogrel (PLAVIX) 75 MG tablet Take 1 tablet (75 mg total) by mouth daily.   Dexlansoprazole (DEXILANT) 30 MG capsule Take 30- 60 min before your first and last meals of the day (Patient taking differently: Take 30 mg  by mouth daily.)   diclofenac Sodium (VOLTAREN) 1 % GEL Apply 2 g topically 4 (four) times daily as needed (Pain).   docusate sodium (COLACE) 100 MG capsule Take 100 mg by mouth daily as needed for mild constipation or moderate constipation.    HYDROcodone-acetaminophen (NORCO) 7.5-325 MG per tablet Take 1 tablet by mouth 2 (two) times daily as needed for moderate pain.  ibuprofen (ADVIL) 200 MG tablet Take 400 mg by mouth every 6 (six) hours as needed for headache or moderate pain.   JANUVIA 100 MG tablet Take 100 mg by mouth daily.   JARDIANCE 25 MG TABS tablet Take 25 mg by mouth daily.   losartan (COZAAR) 25 MG tablet Take 25 mg by mouth daily.   metFORMIN (GLUCOPHAGE) 1000 MG tablet Take 1,000 mg by mouth 2 (two) times daily with a meal.   Misc Natural Products (TURMERIC CURCUMIN) CAPS Take 1 capsule by mouth daily as needed (pain).   Multiple Vitamins-Minerals (AIRBORNE PO) Take 1 tablet by mouth daily.   nitroGLYCERIN (NITROSTAT) 0.4 MG SL tablet Place 1 tablet (0.4 mg total) under the tongue every 5 (five) minutes as needed for chest pain.   ONE TOUCH ULTRA TEST test strip    OneTouch Delica Lancets 99991111 MISC Apply topically.   PROAIR HFA 108 (90 BASE) MCG/ACT inhaler Inhale 2 puffs into the lungs every 6 (six) hours as needed for wheezing or shortness of breath.    sildenafil (REVATIO) 20 MG tablet Take 40-60 mg by mouth daily as needed for erectile dysfunction.   Tiotropium Bromide Monohydrate (SPIRIVA RESPIMAT) 2.5 MCG/ACT AERS Inhale 2 puffs into the lungs daily.   XELJANZ 5 MG TABS TAKE 1 TABLET BY MOUTH DAILY                         Objective:   Physical Exam     08/09/2020         158 02/09/2019         176  08/05/2018       172 05/20/2018       176 11/04/2017     180  04/04/2017       179  03/11/2017         180   02/13/2017        175   01/28/17 175 lb 12.8 oz (79.7 kg)  01/16/17 175 lb (79.4 kg)  11/01/16 170 lb (77.1 kg)    Vital signs reviewed  08/09/2020  - Note at  rest 02 sats  97% on RA   General appearance:    anxious amb bm nad      HEENT : pt wearing mask not removed for exam due to covid - 19 concerns.    NECK :  without JVD/Nodes/TM/ nl carotid upstrokes bilaterally   LUNGS: no acc muscle use,  Mild barrel  contour chest wall with bilateral  Distant bs s audible wheeze and  without cough on insp or exp maneuvers  and mild  Hyperresonant  to  percussion bilaterally     CV:  RRR  no s3 or murmur or increase in P2, and no edema   ABD:  soft and nontender with pos end  insp Hoover's  in the supine position. No bruits or organomegaly appreciated, bowel sounds nl  MS:   Nl gait/  ext warm without deformities, calf tenderness, cyanosis or clubbing No obvious joint restrictions   SKIN: warm and dry without lesions    NEURO:  alert, approp, nl sensorium with  no motor or cerebellar deficits apparent.          CXR PA and Lateral:   08/09/2020 :    I personally reviewed images and agree with radiology impression as follows:    No active disease. Chronic upper lobe emphysematous change. Bilateral pulmonary scarring. My review: large amt of intestinal gas  present         Assessment:

## 2020-08-10 ENCOUNTER — Telehealth: Payer: Self-pay | Admitting: *Deleted

## 2020-08-10 ENCOUNTER — Encounter: Payer: Self-pay | Admitting: *Deleted

## 2020-08-10 ENCOUNTER — Encounter: Payer: Self-pay | Admitting: Internal Medicine

## 2020-08-10 DIAGNOSIS — R0600 Dyspnea, unspecified: Secondary | ICD-10-CM

## 2020-08-10 DIAGNOSIS — R0609 Other forms of dyspnea: Secondary | ICD-10-CM

## 2020-08-10 NOTE — Telephone Encounter (Signed)
Tried calling the pt and there was no answer- LMTCB.  

## 2020-08-10 NOTE — Assessment & Plan Note (Signed)
He apparently has multiple types of cp but the one on the Left lateral wall is typical of IBS related cp:  Classic subdiaphragmatic pain pattern suggests ibs:  Stereotypical, migratory with a very limited distribution of pain locations, daytime, not usually exacerbated by exercise  or coughing, worse in sitting position, frequently associated with generalized abd bloating, not as likely to be present supine due to the dome effect of the diaphragm which  is  canceled in that position. Frequently these patients have had multiple negative GI workups and CT scans.  Treatment consists of avoiding foods that cause gas (especially boiled eggs, mexcican food but especially  beans and undercooked vegetables like  spinach and some salads)  and citrucel 1 heaping tsp twice daily with a large glass of water.  Pain should improve w/in 2 weeks and if not then consider further GI work up.      F/u in 4 weeks with all meds in hand using a trust but verify approach to confirm accurate Medication  Reconciliation The principal here is that until we are certain that the  patients are doing what we've asked, it makes no sense to ask them to do more.   Each maintenance medication was reviewed in detail including emphasizing most importantly the difference between maintenance and prns and under what circumstances the prns are to be triggered using an action plan format where appropriate.  Total time for H and P, chart review, counseling,  directly observing portions of ambulatory 02 saturation study/ and generating customized AVS unique to this office visit / same day charting = 23 min

## 2020-08-10 NOTE — Telephone Encounter (Signed)
-----   Message from Tanda Rockers, MD sent at 08/10/2020  9:03 AM EDT ----- Let him know I reviewed all his recent labs and can't fine a TSH since 2020 so with wt loss would rec this be done either at Dr Marcial Pacas office or at our lab in [place] if that works for him   Copy to Dr [name] after you talk to pt.

## 2020-08-11 NOTE — Telephone Encounter (Signed)
Spoke with the pt and notified of recs per MW. He verbalized understanding and states will go to [place] location for labs next wk. Order placed. Nothing further needed.

## 2020-08-11 NOTE — Telephone Encounter (Signed)
Pt returning a phone call. Pt can be reached at [phone] or [phone]. Pt will be out until about 11am would like a call back after then.

## 2020-08-14 DIAGNOSIS — R0600 Dyspnea, unspecified: Secondary | ICD-10-CM | POA: Diagnosis not present

## 2020-08-15 ENCOUNTER — Encounter: Payer: Self-pay | Admitting: *Deleted

## 2020-08-15 DIAGNOSIS — G5621 Lesion of ulnar nerve, right upper limb: Secondary | ICD-10-CM | POA: Diagnosis not present

## 2020-08-15 DIAGNOSIS — G629 Polyneuropathy, unspecified: Secondary | ICD-10-CM | POA: Insufficient documentation

## 2020-08-15 DIAGNOSIS — M5412 Radiculopathy, cervical region: Secondary | ICD-10-CM | POA: Diagnosis not present

## 2020-08-15 LAB — TSH: TSH: 1.06 u[IU]/mL (ref 0.450–4.500)

## 2020-09-06 DIAGNOSIS — E1165 Type 2 diabetes mellitus with hyperglycemia: Secondary | ICD-10-CM | POA: Diagnosis not present

## 2020-09-06 DIAGNOSIS — I10 Essential (primary) hypertension: Secondary | ICD-10-CM | POA: Diagnosis not present

## 2020-09-08 ENCOUNTER — Encounter: Payer: Self-pay | Admitting: *Deleted

## 2020-09-09 ENCOUNTER — Encounter: Payer: Self-pay | Admitting: Cardiology

## 2020-09-09 NOTE — Progress Notes (Signed)
Cardiology Office Note  Date: 09/12/2020   ID: [name], DOB 05-20-1950, MRN [id number]  PCP:  [name], MD  Cardiologist:  Rozann Lesches, MD Electrophysiologist:  None   Chief Complaint  Patient presents with   Cardiac follow-up     History of Present Illness: [name] is a 70 y.o. male last seen in July by Mr. Leonides Sake NP.  He presents for a routine visit.  He does not describe any obvious angina symptoms.  Also stable claudication.  His weight is stable in comparison to July, states that he has a good appetite.  He does think that he is lost some overall weight since being on Jardiance.  LDL was 71 in April.  He continues on Lipitor.  We went over the remainder of his medications and I asked him to stop Imdur.  He has a history of nonobstructive CAD and no active angina, also using sildenafil periodically.  Past Medical History:  Diagnosis Date   Bronchitis    Carotid artery disease ([place])    Cervical spine disease    Neck surgery in 2007 with Dr. Joya Salm   COPD (chronic obstructive pulmonary disease) ([place])    Coronary atherosclerosis of native coronary artery    Nonobstrtuctive by catheterization 2007, 2013, 2021   DVT (deep venous thrombosis) ([place])    Essential hypertension    GERD (gastroesophageal reflux disease)    Hyperlipidemia    PAD (peripheral artery disease) ([place])    s/p left fem-pop bypass 2007; PTA and stenting of right iliac artery 09/2011   Rheumatoid arthritis(714.0)    Type 2 diabetes mellitus ([place])     Past Surgical History:  Procedure Laterality Date   ABDOMINAL AORTAGRAM N/A 10/03/2011   Procedure: ABDOMINAL Maxcine Ham;  Surgeon: Conrad Dana, MD;  Location: [facility] CATH LAB;  Service: Cardiovascular;  Laterality: N/A;   ABDOMINAL AORTOGRAM W/LOWER EXTREMITY N/A 01/16/2017   Procedure: ABDOMINAL AORTOGRAM W/LOWER EXTREMITY;  Surgeon: Conrad Hemphill, MD;  Location: [facility] CV LAB;  Service: Cardiovascular;  Laterality: N/A;   ABDOMINAL  AORTOGRAM W/LOWER EXTREMITY Bilateral 08/04/2019   Procedure: ABDOMINAL AORTOGRAM W/LOWER EXTREMITY;  Surgeon: Marty Heck, MD;  Location: [facility] LAB;  Service: Cardiovascular;  Laterality: Bilateral;   ABDOMINAL AORTOGRAM W/LOWER EXTREMITY N/A 03/09/2020   Procedure: ABDOMINAL AORTOGRAM W/LOWER EXTREMITY;  Surgeon: Marty Heck, MD;  Location: [facility];  Service: Cardiovascular;  Laterality: N/A;   CERVICAL DISC SURGERY     COLONOSCOPY WITH PROPOFOL N/A 02/17/2017   Procedure: COLONOSCOPY WITH PROPOFOL;  Surgeon: Daneil Dolin, MD;  [facility];  Service: Endoscopy;  Laterality: N/A;  2:45pm   FEMORAL-POPLITEAL BYPASS GRAFT  11/06/2005   ILIAC ARTERY STENT  10-03-11   Right CIA stenting   LEFT HEART CATH AND CORONARY ANGIOGRAPHY N/A 04/14/2019   Procedure: LEFT HEART CATH AND CORONARY ANGIOGRAPHY;  Surgeon: Troy Sine, MD;  Location: [facility] LAB;  Service: Cardiovascular;  Laterality: N/A;   PERIPHERAL VASCULAR BALLOON ANGIOPLASTY Right 01/16/2017   Procedure: PERIPHERAL VASCULAR BALLOON ANGIOPLASTY;  Surgeon: Conrad Miranda, MD;  Location: [facility];  Service: Cardiovascular;  Laterality: Right;  common iliac    PERIPHERAL VASCULAR BALLOON ANGIOPLASTY  03/09/2020   Procedure: PERIPHERAL VASCULAR BALLOON ANGIOPLASTY;  Surgeon: Marty Heck, MD;  Location: [facility];  Service: Cardiovascular;;   PR VEIN BYPASS GRAFT,AORTO-FEM-POP  06-22-2010   Redo Left Fem-Pop   Repair of left arm  fracture     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     atorvastatin (LIPITOR) 20 MG tablet Take 20 mg by mouth daily.     bismuth subsalicylate (PEPTO BISMOL) 262 MG/15ML suspension Take 30 mLs by mouth every 6 (six) hours as needed for indigestion or diarrhea or loose stools.      budesonide-formoterol (SYMBICORT) 160-4.5 MCG/ACT inhaler INHALE 2 PUFFS BY MOUTH TWICE A DAY RINSE MOUTH AFTER USE. (Patient taking differently: Inhale 2 puffs into the lungs in the morning and at bedtime.) 10.2 g 5   cilostazol (PLETAL) 100 MG tablet TAKE 1 TABLET(100 MG) BY MOUTH TWICE DAILY (Patient taking differently: Take 100 mg by mouth 2 (two) times daily.) 60 tablet 5   clopidogrel (PLAVIX) 75 MG tablet Take 1 tablet (75 mg total) by mouth daily. 30 tablet 11   Dexlansoprazole (DEXILANT) 30 MG capsule Take 30- 60 min before your first and last meals of the day (Patient taking differently: Take 30 mg by mouth daily.) 60 capsule 11   diclofenac Sodium (VOLTAREN) 1 % GEL Apply 2 g topically 4 (four) times daily as needed (Pain).     docusate sodium (COLACE) 100 MG capsule Take 100 mg by mouth daily as needed for mild constipation or moderate constipation.      HYDROcodone-acetaminophen (NORCO) 7.5-325 MG per tablet Take 1 tablet by mouth 2 (two) times daily as needed for moderate pain.     ibuprofen (ADVIL) 200 MG tablet Take 400 mg by mouth every 6 (six) hours as needed for headache or moderate pain.     JANUVIA 100 MG tablet Take 100 mg by mouth daily.     JARDIANCE 25 MG TABS tablet Take 25 mg by mouth daily.     losartan (COZAAR) 25 MG tablet Take 25 mg by mouth daily.     metFORMIN (GLUCOPHAGE) 1000 MG tablet Take 1,000 mg by mouth 2 (two) times daily with a meal.     Misc Natural Products (TURMERIC CURCUMIN) CAPS Take 1 capsule by mouth daily as needed (pain).     Multiple Vitamins-Minerals (AIRBORNE PO) Take 1 tablet by mouth daily.     nitroGLYCERIN (NITROSTAT) 0.4 MG SL tablet Place 1 tablet (0.4 mg total) under the tongue every 5 (five) minutes as needed for chest pain. 25 tablet 3   ONE TOUCH ULTRA TEST test strip      OneTouch Delica Lancets 99991111 MISC Apply topically.     PROAIR HFA 108 (90 BASE) MCG/ACT inhaler Inhale 2 puffs into the lungs every 6 (six) hours as needed for  wheezing or shortness of breath.      sildenafil (REVATIO) 20 MG tablet Take 40-60 mg by mouth daily as needed for erectile dysfunction.     Tiotropium Bromide Monohydrate (SPIRIVA RESPIMAT) 2.5 MCG/ACT AERS Inhale 2 puffs into the lungs daily. 4 g 11   XELJANZ 5 MG TABS TAKE 1 TABLET BY MOUTH DAILY 90 tablet 0   No current facility-administered medications for this visit.   Allergies:  Patient has no known allergies.   ROS: No palpitations or syncope.  Physical Exam: VS:  BP 110/60   Pulse 96   Ht '5\' 9"'$  (1.753 m)   Wt 163 lb 9.6 oz (74.2 kg)   SpO2 97%   BMI  24.16 kg/m , BMI Body mass index is 24.16 kg/m.  Wt Readings from Last 3 Encounters:  09/12/20 163 lb 9.6 oz (74.2 kg)  08/09/20 158 lb (71.7 kg)  07/31/20 161 lb 3.2 oz (73.1 kg)    General: Patient appears comfortable at rest. HEENT: Conjunctiva and lids normal, wearing a mask. Neck: Supple, no elevated JVP or carotid bruits, no thyromegaly. Lungs: Clear to auscultation, nonlabored breathing at rest. Cardiac: Regular rate and rhythm, no S3 or significant systolic murmur. Extremities: No pitting edema.  ECG:  An ECG dated 05/01/2020 was personally reviewed today and demonstrated:  Sinus rhythm with nonspecific T wave changes.  Recent Labwork: 08/02/2020: ALT 14; AST 21; BUN 16; Creatinine, Ser 1.07; Hemoglobin 12.6; Platelets 335; Potassium 4.5; Sodium 142 08/14/2020: TSH 1.060     Component Value Date/Time   CHOL 148 05/05/2020 0000   CHOL 160 11/09/2019 1032   TRIG 49 05/05/2020 0000   HDL 63 05/05/2020 0000   HDL 61 11/09/2019 1032   CHOLHDL 2.3 05/05/2020 0000   LDLCALC 71 05/05/2020 0000    Other Studies Reviewed Today:  Cardiac catheterization 04/14/2019: Prox LAD to Mid LAD lesion is 20% stenosed. Mid LAD lesion is 30% stenosed. Prox RCA lesion is 30% stenosed.   Mild non-obstructive CAD with 20 and 30% irregularities in the proximal to mid LAD; normal ramus intermediate and left circumflex coronary  arteries;  and RCA with smooth 30% proximal narrowing with distal vessel tortuosity.   Low normal global LV function with EF estimated at 50%; LVEDP 14 mmHg.  Lower extremity arterial Dopplers 04/12/2020: Summary:  Right: Near normal examination. Patent stent with no evidence of stenosis  in the Right common iliac artery. No focal stenosis identified in the SFA.   Left: Patent left fem-pop bypass graft.   Carotid Dopplers [facility]) 05/24/2020: IMPRESSION:  Bilateral carotid atherosclerosis. No hemodynamically significant  ICA stenosis. Degree of narrowing less than 50% bilaterally by  ultrasound criteria.   Patent antegrade vertebral flow bilaterally   ABIs 07/31/2020: Summary:  Right: Resting right ankle-brachial index indicates noncompressible right  lower extremity arteries. The right toe-brachial index is abnormal.   Left: Resting left ankle-brachial index indicates noncompressible left  lower extremity arteries. The left toe-brachial index is abnormal.   Assessment and Plan:  1.  Nonobstructive CAD by cardiac catheterization in April 2021.  As discussed above, Imdur will be discontinued.  Otherwise continue aspirin, Lipitor, and losartan.  2.  Mixed hyperlipidemia, on Lipitor.  Recent LDL 71.  3.  Essential hypertension, blood pressure well controlled today.  No changes were made.  4.  PAD status post left sided common femoral to popliteal bypass as well as more recently right mid SFA angioplasty with drug-coated balloon and right proximal anterior tibial artery angioplasty by Dr. Carlis Abbott.  He continues on dual antiplatelet therapy and statin.  Medication Adjustments/Labs and Tests Ordered: Current medicines are reviewed at length with the patient today.  Concerns regarding medicines are outlined above.   Tests Ordered: No orders of the defined types were placed in this encounter.   Medication Changes: No orders of the defined types were placed in this  encounter.   Disposition:  Follow up  6 months.  Signed, Satira Sark, MD, [facility] 09/12/2020 [facility] [place], [place], [place] [address] Phone: [phone]; Fax: [phone]

## 2020-09-12 ENCOUNTER — Ambulatory Visit (INDEPENDENT_AMBULATORY_CARE_PROVIDER_SITE_OTHER): Payer: Medicare Other | Admitting: Cardiology

## 2020-09-12 ENCOUNTER — Encounter: Payer: Self-pay | Admitting: Cardiology

## 2020-09-12 ENCOUNTER — Other Ambulatory Visit: Payer: Self-pay

## 2020-09-12 VITALS — BP 110/60 | HR 96 | Ht 69.0 in | Wt 163.6 lb

## 2020-09-12 DIAGNOSIS — I739 Peripheral vascular disease, unspecified: Secondary | ICD-10-CM | POA: Diagnosis not present

## 2020-09-12 DIAGNOSIS — E782 Mixed hyperlipidemia: Secondary | ICD-10-CM

## 2020-09-12 DIAGNOSIS — I25119 Atherosclerotic heart disease of native coronary artery with unspecified angina pectoris: Secondary | ICD-10-CM | POA: Diagnosis not present

## 2020-09-12 NOTE — Patient Instructions (Addendum)
Medication Instructions:  Your physician has recommended you make the following change in your medication:  Stop isosorbide mononitrate (imdur)  Continue other medications the same  Labwork: none  Testing/Procedures: none  Follow-Up: Your physician recommends that you schedule a follow-up appointment in: 6 months  Any Other Special Instructions Will Be Listed Below (If Applicable).  If you need a refill on your cardiac medications before your next appointment, please call your pharmacy.

## 2020-09-14 ENCOUNTER — Other Ambulatory Visit: Payer: Self-pay | Admitting: Internal Medicine

## 2020-09-15 DIAGNOSIS — R0789 Other chest pain: Secondary | ICD-10-CM | POA: Diagnosis not present

## 2020-09-15 DIAGNOSIS — I10 Essential (primary) hypertension: Secondary | ICD-10-CM | POA: Diagnosis not present

## 2020-09-15 DIAGNOSIS — Z299 Encounter for prophylactic measures, unspecified: Secondary | ICD-10-CM | POA: Diagnosis not present

## 2020-09-15 DIAGNOSIS — R252 Cramp and spasm: Secondary | ICD-10-CM | POA: Diagnosis not present

## 2020-09-15 DIAGNOSIS — R109 Unspecified abdominal pain: Secondary | ICD-10-CM | POA: Diagnosis not present

## 2020-09-19 ENCOUNTER — Other Ambulatory Visit: Payer: Self-pay | Admitting: Internal Medicine

## 2020-09-21 DIAGNOSIS — I6381 Other cerebral infarction due to occlusion or stenosis of small artery: Secondary | ICD-10-CM | POA: Diagnosis not present

## 2020-09-21 DIAGNOSIS — J439 Emphysema, unspecified: Secondary | ICD-10-CM | POA: Diagnosis not present

## 2020-09-21 DIAGNOSIS — S0990XA Unspecified injury of head, initial encounter: Secondary | ICD-10-CM | POA: Diagnosis not present

## 2020-09-21 DIAGNOSIS — Z7984 Long term (current) use of oral hypoglycemic drugs: Secondary | ICD-10-CM | POA: Diagnosis not present

## 2020-09-21 DIAGNOSIS — R42 Dizziness and giddiness: Secondary | ICD-10-CM | POA: Diagnosis not present

## 2020-09-21 DIAGNOSIS — Z951 Presence of aortocoronary bypass graft: Secondary | ICD-10-CM | POA: Diagnosis not present

## 2020-09-21 DIAGNOSIS — Z981 Arthrodesis status: Secondary | ICD-10-CM | POA: Diagnosis not present

## 2020-09-21 DIAGNOSIS — E890 Postprocedural hypothyroidism: Secondary | ICD-10-CM | POA: Diagnosis not present

## 2020-09-21 DIAGNOSIS — W208XXA Other cause of strike by thrown, projected or falling object, initial encounter: Secondary | ICD-10-CM | POA: Diagnosis not present

## 2020-09-21 DIAGNOSIS — Z7951 Long term (current) use of inhaled steroids: Secondary | ICD-10-CM | POA: Diagnosis not present

## 2020-09-21 DIAGNOSIS — I10 Essential (primary) hypertension: Secondary | ICD-10-CM | POA: Diagnosis not present

## 2020-09-21 DIAGNOSIS — E119 Type 2 diabetes mellitus without complications: Secondary | ICD-10-CM | POA: Diagnosis not present

## 2020-09-21 DIAGNOSIS — M069 Rheumatoid arthritis, unspecified: Secondary | ICD-10-CM | POA: Diagnosis not present

## 2020-09-21 DIAGNOSIS — G319 Degenerative disease of nervous system, unspecified: Secondary | ICD-10-CM | POA: Diagnosis not present

## 2020-09-21 DIAGNOSIS — S199XXA Unspecified injury of neck, initial encounter: Secondary | ICD-10-CM | POA: Diagnosis not present

## 2020-09-21 DIAGNOSIS — Z87891 Personal history of nicotine dependence: Secondary | ICD-10-CM | POA: Diagnosis not present

## 2020-09-21 DIAGNOSIS — S060X0A Concussion without loss of consciousness, initial encounter: Secondary | ICD-10-CM | POA: Diagnosis not present

## 2020-09-22 NOTE — Progress Notes (Deleted)
Office Visit Note  Patient: [name]             Date of Birth: Jan 06, 1951           MRN: [id number]             PCP: [name], MD Referring: [name], MD Visit Date: 10/06/2020 Occupation: '@GUAROCC'$ @  Subjective:    History of Present Illness: Ivo Ackers is a 70 y.o. male with history of seronegative rheumatoid arthritis and osteoarthritis.  Patient is currently taking Xeljanz 5 mg 1 tablet by mouth daily.  TB Gold negative on 10/19/2019.  Order for TB gold released today.  CBC and BMP were drawn on 09/21/2020.  Hepatic function panel will be checked today.  He will continue to require lab monitoring every 3 months to monitor for drug toxicity.  Standing orders for CBC and CMP remain in place. Discussed the importance of holding Morrie Sheldon if he develops signs or symptoms of an infection and to resume once the infection has completely cleared.  Activities of Daily Living:  Patient reports morning stiffness for *** {minute/hour:19697}.   Patient {ACTIONS;DENIES/REPORTS:21021675::"Denies"} nocturnal pain.  Difficulty dressing/grooming: {ACTIONS;DENIES/REPORTS:21021675::"Denies"} Difficulty climbing stairs: {ACTIONS;DENIES/REPORTS:21021675::"Denies"} Difficulty getting out of chair: {ACTIONS;DENIES/REPORTS:21021675::"Denies"} Difficulty using hands for taps, buttons, cutlery, and/or writing: {ACTIONS;DENIES/REPORTS:21021675::"Denies"}  No Rheumatology ROS completed.   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   Opioid dependence ([place]) 10/15/2018   Other long term (current) drug therapy 10/15/2018   Puncture wound of finger of left hand 08/08/2018   Puncture wound of finger, right, complicated, initial encounter 08/05/2018   DOE (dyspnea on exertion) 11/04/2017   Muscle cramps 07/04/2017   Contracture of left elbow 07/04/2017   High risk  medication use 06/23/2017   Primary osteoarthritis of both hands 06/23/2017   Primary osteoarthritis of both feet 06/23/2017   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 factor ([place]) 06/23/2017   DDD (degenerative disc disease), cervical 06/23/2017   Rotator cuff syndrome of right shoulder 06/06/2017   History of  bilateral carpal tunnel release 06/06/2017   Ganglion cyst of wrist, left 06/06/2017   Non-insulin dependent type 2 diabetes mellitus ([place]) 06/06/2017   Chest pain with high risk for cardiac etiology 04/05/2017   COPD with acute exacerbation ([place]) 03/13/2017   Plantar fasciitis 02/21/2017   Iliac artery stenosis, right ([place]) 02/12/2017   Hematochezia 02/10/2017   Abnormal CT of the abdomen 02/10/2017   Cervical spondylosis 10/11/2016   Bony growth 10/13/2015   Pain in joint, lower leg 03/02/2014   Discoloration of skin-Left dorsum foot 09/28/2013   Swelling of limb-Left Calf / Leg 09/28/2013   PVD (peripheral vascular disease) ([place]) 10/14/2012   Pain in limb- Left popliteal and calf 10/14/2012   Dyslipidemia, goal LDL below 70    Essential hypertension 03/05/2012   Secondary cardiomyopathy, 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/12/2011   Generalized osteoarthritis of multiple sites 02/06/2011   History of arterial bypass of lower limb 12/25/2010   Atherosclerosis of native arteries of extremity with intermittent claudication ([place]) 12/25/2010   Coronary artery calcification seen on CT scan 04/16/2008   COPD  GOLD II 04/16/2008   GASTROESOPHAGEAL REFLUX DISEASE 04/16/2008   Rheumatoid arthritis ([place]) 04/16/2008   DIABETES MELLITUS, BORDERLINE 04/16/2008    Past Medical History:  Diagnosis Date   Bronchitis    Carotid artery disease ([place])    Cervical spine  disease    Neck surgery in 2007 with Dr. Joya Salm   COPD (chronic obstructive pulmonary disease) ([place])    Coronary atherosclerosis of native coronary artery    Nonobstrtuctive by catheterization 2007, 2013, 2021   DVT  (deep venous thrombosis) ([place])    Essential hypertension    GERD (gastroesophageal reflux disease)    Hyperlipidemia    PAD (peripheral artery disease) ([place])    s/p left fem-pop bypass 2007; PTA and stenting of right iliac artery 09/2011   Rheumatoid arthritis(714.0)    Type 2 diabetes mellitus ([place])     Family History  Problem Relation Age of Onset   Heart attack Mother 73   Deep vein thrombosis Mother    Diabetes Mother    Hyperlipidemia Mother    Hypertension Mother    Peripheral vascular disease Mother    Stroke Father    Diabetes Father    Hyperlipidemia Father    Hypertension Father    Peripheral vascular disease Father    Diabetes Sister    Hyperlipidemia Sister    Hypertension Sister    Diabetes Brother    Hyperlipidemia Brother    Cancer Brother    Deep vein thrombosis Brother    Hypertension Brother    Peripheral vascular disease Brother    Hyperlipidemia Son    Cancer Son    Hypertension Son    Diabetes Daughter    Colon cancer Neg Hx    Past Surgical History:  Procedure Laterality Date   ABDOMINAL AORTAGRAM N/A 10/03/2011   Procedure: ABDOMINAL Maxcine Ham;  Surgeon: Conrad Egypt, MD;  Location: [facility] CATH LAB;  Service: Cardiovascular;  Laterality: N/A;   ABDOMINAL AORTOGRAM W/LOWER EXTREMITY N/A 01/16/2017   Procedure: ABDOMINAL AORTOGRAM W/LOWER EXTREMITY;  Surgeon: Conrad Fonda, MD;  Location: [facility] CV LAB;  Service: Cardiovascular;  Laterality: N/A;   ABDOMINAL AORTOGRAM W/LOWER EXTREMITY Bilateral 08/04/2019   Procedure: ABDOMINAL AORTOGRAM W/LOWER EXTREMITY;  Surgeon: Marty Heck, MD;  Location: [place] CV LAB;  Service: Cardiovascular;  Laterality: Bilateral;   ABDOMINAL AORTOGRAM W/LOWER EXTREMITY N/A 03/09/2020   Procedure: ABDOMINAL AORTOGRAM W/LOWER EXTREMITY;  Surgeon: Marty Heck, MD;  Location: [place] CV LAB;  Service: Cardiovascular;  Laterality: N/A;   CERVICAL DISC SURGERY     COLONOSCOPY WITH PROPOFOL N/A 02/17/2017    Procedure: COLONOSCOPY WITH PROPOFOL;  Surgeon: Daneil Dolin, MD;  [facility];  Service: Endoscopy;  Laterality: N/A;  2:45pm   FEMORAL-POPLITEAL BYPASS GRAFT  11/06/2005   ILIAC ARTERY STENT  10-03-11   Right CIA stenting   LEFT HEART CATH AND CORONARY ANGIOGRAPHY N/A 04/14/2019   Procedure: LEFT HEART CATH AND CORONARY ANGIOGRAPHY;  Surgeon: Troy Sine, MD;  Location: [facility] CV LAB;  Service: Cardiovascular;  Laterality: N/A;   PERIPHERAL VASCULAR BALLOON ANGIOPLASTY Right 01/16/2017   Procedure: PERIPHERAL VASCULAR BALLOON ANGIOPLASTY;  Surgeon: Conrad , MD;  Location: [place] CV LAB;  Service: Cardiovascular;  Laterality: Right;  common iliac    PERIPHERAL VASCULAR BALLOON ANGIOPLASTY  03/09/2020   Procedure: PERIPHERAL VASCULAR BALLOON ANGIOPLASTY;  Surgeon: Marty Heck, MD;  Location: [facility] CV LAB;  Service: Cardiovascular;;   PR VEIN BYPASS GRAFT,AORTO-FEM-POP  06-22-2010   Redo Left Fem-Pop   Repair of left arm fracture     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  Immunization History  Administered Date(s) Administered   Influenza, High Dose Seasonal PF 10/14/2016, 09/21/2018   Influenza-Unspecified 11/08/2008, 12/10/2019   Moderna Sars-Covid-2 Vaccination 02/19/2019, 03/22/2019, 11/23/2019   PPD Test 09/22/2007   Tdap 02/09/2001, 08/05/2018     Objective: Vital Signs: There were no vitals taken for this visit.   Physical Exam Vitals and nursing note reviewed.  Constitutional:      Appearance: He is well-developed.  HENT:     Head: Normocephalic and atraumatic.  Eyes:     Conjunctiva/sclera: Conjunctivae normal.     Pupils: Pupils are equal, round, and reactive to light.  Pulmonary:     Effort: Pulmonary effort is normal.  Abdominal:     Palpations: Abdomen is soft.  Musculoskeletal:     Cervical back: Normal range of  motion and neck supple.  Skin:    General: Skin is warm and dry.     Capillary Refill: Capillary refill takes less than 2 seconds.  Neurological:     Mental Status: He is alert and oriented to person, place, and time.  Psychiatric:        Behavior: Behavior normal.     Musculoskeletal Exam: ***  CDAI Exam: CDAI Score: -- Patient Global: --; Provider Global: -- Swollen: --; Tender: -- Joint Exam 10/06/2020   No joint exam has been documented for this visit   There is currently no information documented on the homunculus. Go to the Rheumatology activity and complete the homunculus joint exam.  Investigation: No additional findings.  Imaging: No results found.  Recent Labs: Lab Results  Component Value Date   WBC 8.1 08/02/2020   HGB 12.6 (L) 08/02/2020   PLT 335 08/02/2020   NA 142 08/02/2020   K 4.5 08/02/2020   CL 103 08/02/2020   CO2 24 08/02/2020   GLUCOSE 100 (H) 08/02/2020   BUN 16 08/02/2020   CREATININE 1.07 08/02/2020   BILITOT 0.3 08/02/2020   ALKPHOS 83 08/02/2020   AST 21 08/02/2020   ALT 14 08/02/2020   PROT 7.2 08/02/2020   ALBUMIN 4.5 08/02/2020   CALCIUM 9.6 08/02/2020   GFRAA 83 01/27/2020   QFTBGOLDPLUS Negative 10/19/2019    Speciality Comments: Prior therapy includes: methotrexate ( d/c alcohol use), Arava (d/c patient preference), [place] (patient declined)  Procedures:  No procedures performed Allergies: Patient has no known allergies.   Assessment / Plan:     Visit Diagnoses: 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 factor (HCC)  High risk medication use  Contracture of left elbow  Primary osteoarthritis of both hands  Chronic pain of left knee  Primary osteoarthritis of both feet  DDD (degenerative disc disease), cervical  Rotator cuff syndrome of right shoulder  History of  bilateral carpal tunnel release  Essential hypertension  Mixed hyperlipidemia  Atherosclerosis of native coronary  artery of native heart without angina pectoris  PVD (peripheral vascular disease) (HCC)  History of diabetes mellitus  COPD  GOLD II  Orders: No orders of the defined types were placed in this encounter.  No orders of the defined types were placed in this encounter.   Face-to-face time spent with patient was *** minutes. Greater than 50% of time was spent in counseling and coordination of care.  Follow-Up Instructions: No follow-ups on file.   Ofilia Neas, PA-C  Note - This record has been created using Dragon software.  Chart creation errors have been sought, but may not always  have been located. Such creation errors do not  reflect on  the standard of medical care.

## 2020-09-28 ENCOUNTER — Encounter: Payer: Self-pay | Admitting: Internal Medicine

## 2020-09-28 ENCOUNTER — Ambulatory Visit (INDEPENDENT_AMBULATORY_CARE_PROVIDER_SITE_OTHER): Payer: Medicare Other | Admitting: Internal Medicine

## 2020-09-28 ENCOUNTER — Other Ambulatory Visit: Payer: Self-pay

## 2020-09-28 DIAGNOSIS — J449 Chronic obstructive pulmonary disease, unspecified: Secondary | ICD-10-CM | POA: Diagnosis not present

## 2020-09-28 MED ORDER — BUDESONIDE-FORMOTEROL FUMARATE 160-4.5 MCG/ACT IN AERO: INHALATION_SPRAY | RESPIRATORY_TRACT | 11 refills | Status: DC

## 2020-09-28 MED ORDER — SPIRIVA RESPIMAT 2.5 MCG/ACT IN AERS: 2.0000 | INHALATION_SPRAY | Freq: Every day | RESPIRATORY_TRACT | 11 refills | Status: DC

## 2020-09-28 NOTE — Assessment & Plan Note (Signed)
Quit smoking 2013 PFT's 01/04/15  FEV1 2.15 (71 % ) ratio 58  p 14 % improvement from saba p ? prior to study with DLCO  70 % corrects to 94  % for alv volume  With only mild curvature f/v loop  - Spirometry 01/28/2017  FEV1 1.59 (53%)  Ratio 59 off all rx  - 01/28/2017    symb 160 2bid and d/c acei and double the dexilant > marked improvement 02/13/2017  PFT's  04/04/2017  FEV1 1.99 (68 % ) ratio 62  p 23 % improvement from saba p nothing prior to study with DLCO  63 % corrects to 82  % for alv volume   - 04/04/2017  After extensive coaching inhaler device  effectiveness =    90%  - 11/04/2017  Walked RA x 3 laps @ 185 ft each stopped due to  End of study, fast pace, no  Desat, mild sob  - 11/04/2017  After extensive coaching inhaler device,  effectiveness =    75% > restart symb 160 2bid - Spirometry 11/04/2017  FEV1 1.4 (47%)  Ratio 58 off all rx     - 05/20/2018  After extensive coaching inhaler device,  effectiveness = 75% with symb and 90% with SMI > try add spiriva 2 pffs each am   - 05/20/2018   Walked RA  2 laps @  approx 265ft each @ fast pace  stopped due to  End of study, no cp or sob or desats    -  08/09/2020   Walked RA  3 laps @ approx 234ft each @ fast pace  stopped due to end of study, min  sob, sats 96% at end    -09/28/2020  After extensive coaching inhaler device,  effectiveness =    75% (short Ti) for both smi and hfa     Group D in terms of symptom/risk and laba/lama/ICS  therefore appropriate rx at this point >>>  symbicort 160/ spiriva 2.5 maint rx/ prn saba         Each maintenance medication was reviewed in detail including emphasizing most importantly the difference between maintenance and prns and under what circumstances the prns are to be triggered using an action plan format where appropriate.  Total time for H and P, chart review, counseling, reviewing hfa/smi device(s) and generating customized AVS unique to this office visit / same day charting = 25 min

## 2020-09-28 NOTE — Progress Notes (Signed)
Subjective:    Patient ID: [name], male   DOB: 1950/01/08    MRN: [id number]    Brief patient profile:  2  yobm quit smoking 2013 with dx of copd GOLD II with reversibility on prn inhalers didn't really use it much and "worse over the years" and placed on BREO but worse fall  2018 so placed on Trelegy and referred to pulmonary clinic 01/28/2017 by Dr   [name]      History of Present Illness  01/28/2017 1st [facility] office visit/ Sir Mallis  ? Copd   Chief Complaint  Patient presents with   Pulmonary Consult    Referred by Dr. [name] for eval of COPD. Pt states that he has been having SOB for the past several yrs, worse for the past 3-6 months. He gets SOB with walking short distances and up and down stairs. He has CP that comes and goes. He also has some cough with white sputum.  He is using proair once daily on average.   doe x years much worse x sev months, cp is not related to breathing or exertion localized L ant chest/ lasts just a few secs Cough is worse since sob worse/ esp hs and early in am Baseline = 15 min walk outside mostly flat but struggles if gets in a hurry or steps  Thinks proair helps his cp but note resolves in 2 secs whether uses proair or not rec Continue the dexilant but for now take it Take 30- 60 min before your first and last meals of the day until cough and breathing better then just Take 30-60 min before first meal of the day  Stop Trelegy and lisinopril Losartan 50 mg only daily takes the place of lisinopril  Plan A = Automatic = symbicort 160 Take 2 puffs first thing in am and then another 2 puffs about 12 hours later.  Work on inhaler technique:   Plan B = Backup Only use your albuterol as a rescue medication  05/20/2018 acute extended  ov/Pierina Schuknecht re: copd/ recurrent cp x one month/ did not bring meds as requested   Chief Complaint  Patient presents with   Acute Visit    Pt c/o left side CP and increased SOB x 1 month. He has had muscle aches for the  past 6 wks.   Dyspnea:  Walks around park x 15 min daily never tries inhaler before walks, sob stops him and legs hurt but no cp with exertion  Cough: no Sleeping: on side bed is flat two pillows SABA use: bid avg  02: none  Cp comes and goes x years 2-3 min, always in same place / never supine/ did not follow IBS recs rec Plan A = Automatic = symbicort 160 Take 2 puffs first thing in am and then another 2 puffs about 12 hours later.  Add spiriva 2 pffs each am only  Plan B = Backup Only use your albuterol as a rescue medication  Classic   pain pattern suggests ibs rx citrucel/diet        02/09/2019  f/u ov/Krishauna Schatzman re:  GOLD II/ brought meds ./ symb/spiriva maint  Chief Complaint  Patient presents with   Follow-up    Breathing is about the same. He rarely uses his albuterol inhaler. No new co's.    Dyspnea:  MMRC2 = can't walk a nl pace on a flat grade s sob but does fine slow and flat / does stationery bike stops p 5 minutes sets  resistance high  Cough: no Sleeping: ok SABA use: rare 02: none   Rec Increase aerobic activity   08/09/2020  f/u ov/Yaslin Kirtley re: GOLD 2/3  copd maint on  symbicort /spiriva  Chief Complaint  Patient presents with   Follow-up    Pt c/o unintentional wt loss and pain on his left side "for a good while".  His breathing is unchanged.    Dyspnea:  working out at gym x 15 min x ? Speed  and ex bike =resistance at 3 and ? Speed x 5-8 min and denies sob limiting  or any ex cp Cough: sporadic  Sleeping: bed is flat / months  SABA use: not using  02: none  Covid status:   vax x 3  Rec Rx as IBS Please schedule a follow up office visit in 4 weeks, sooner if needed  with all medications /inhalers/ solutions in hand so we can verify exactly what you are taking. This includes all medications from all doctors and over the counters    09/28/2020  f/u ov/ office/Falana Clagg re: COPD GOLD 2 atypical cp c/w IBS maint on symb/spiriva  Chief Complaint  Patient presents  with   Follow-up    SOB feels about the same as last visit. Coughs up yellow/ clear mucus   Dyspnea:  works out on treadmill 2.5 mph x 15 min x flat stops due to L knee  Cough: minimal mostly in am  Sleeping: flat bed, 1 pillow SABA use: never  02: none  Covid status: vax x 3       No obvious day to day or daytime variability or assoc excess/ purulent sputum or mucus plugs or hemoptysis or cp or chest tightness, subjective wheeze or overt sinus or hb symptoms.   Sleeping  without nocturnal  or early am exacerbation  of respiratory  c/o's or need for noct saba. Also denies any obvious fluctuation of symptoms with weather or environmental changes or other aggravating or alleviating factors except as outlined above   No unusual exposure hx or h/o childhood pna/ asthma or knowledge of premature birth.  Current Allergies, Complete Past Medical History, Past Surgical History, Family History, and Social History were reviewed in [employer] record.  ROS  The following are not active complaints unless bolded Hoarseness, sore throat, dysphagia, dental problems, itching, sneezing,  nasal congestion or discharge of excess mucus or purulent secretions, ear ache,   fever, chills, sweats, unintended wt loss or wt gain, classically pleuritic or exertional cp,  orthopnea pnd or arm/hand swelling  or leg swelling, presyncope, palpitations, abdominal pain, anorexia, nausea, vomiting, diarrhea  or change in bowel habits or change in bladder habits, change in stools or change in urine, dysuria, hematuria,  rash, arthralgias, visual complaints, headache, numbness, weakness or ataxia or problems with walking or coordination,  change in mood or  memory.        Current Meds  Medication Sig   aspirin EC 81 MG tablet Take 1 tablet (81 mg total) by mouth daily.   atorvastatin (LIPITOR) 20 MG tablet Take 20 mg by mouth daily.   budesonide-formoterol (SYMBICORT) 160-4.5 MCG/ACT inhaler INHALE 2  PUFFS BY MOUTH TWICE A DAY RINSE MOUTH AFTER USE. (Patient taking differently: Inhale 2 puffs into the lungs in the morning and at bedtime.)   clopidogrel (PLAVIX) 75 MG tablet Take 1 tablet (75 mg total) by mouth daily.   Dexlansoprazole (DEXILANT) 30 MG capsule Take 30- 60 min before your first and  last meals of the day (Patient taking differently: Take 30 mg by mouth daily.)   HYDROcodone-acetaminophen (NORCO) 7.5-325 MG per tablet Take 1 tablet by mouth 2 (two) times daily as needed for moderate pain.   JANUVIA 100 MG tablet Take 100 mg by mouth daily.   JARDIANCE 25 MG TABS tablet Take 25 mg by mouth daily.   losartan (COZAAR) 25 MG tablet Take 25 mg by mouth daily.   metFORMIN (GLUCOPHAGE) 1000 MG tablet Take 1,000 mg by mouth 2 (two) times daily with a meal.   methylcellulose oral powder Take by mouth daily.   nitroGLYCERIN (NITROSTAT) 0.4 MG SL tablet Place 1 tablet (0.4 mg total) under the tongue every 5 (five) minutes as needed for chest pain.   ONE TOUCH ULTRA TEST test strip    OneTouch Delica Lancets 01K MISC Apply topically.   PROAIR HFA 108 (90 BASE) MCG/ACT inhaler Inhale 2 puffs into the lungs every 6 (six) hours as needed for wheezing or shortness of breath.    sildenafil (REVATIO) 20 MG tablet Take 40-60 mg by mouth daily as needed for erectile dysfunction.   XELJANZ 5 MG TABS TAKE 1 TABLET BY MOUTH DAILY   [DISCONTINUED] cilostazol (PLETAL) 100 MG tablet TAKE 1 TABLET(100 MG) BY MOUTH TWICE DAILY (Patient taking differently: Take 100 mg by mouth 2 (two) times daily.)                 Objective:   Physical Exam    09/28/2020      166   08/09/2020         158 02/09/2019         176  08/05/2018       172 05/20/2018       176 11/04/2017     180  04/04/2017       179  03/11/2017         180   02/13/2017        175   01/28/17 175 lb 12.8 oz (79.7 kg)  01/16/17 175 lb (79.4 kg)  11/01/16 170 lb (77.1 kg)     Vital signs reviewed  09/28/2020  - Note at rest 02 sats  98% on RA    General appearance:    amb bm nad    HEENT : pt wearing mask not removed for exam due to covid - 19 concerns.    NECK :  without JVD/Nodes/TM/ nl carotid upstrokes bilaterally   LUNGS: no acc muscle use,  Mild barrel  contour chest wall with bilateral  Distant bs s audible wheeze and  without cough on insp or exp maneuvers  and mild  Hyperresonant  to  percussion bilaterally     CV:  RRR  no s3 or murmur or increase in P2, and no edema   ABD:  soft and nontender with pos end  insp Hoover's  in the supine position. No bruits or organomegaly appreciated, bowel sounds nl  MS:   Nl gait/  ext warm without deformities, calf tenderness, cyanosis or clubbing No obvious joint restrictions   SKIN: warm and dry without lesions    NEURO:  alert, approp, nl sensorium with  no motor or cerebellar deficits apparent.                  Assessment:

## 2020-09-28 NOTE — Patient Instructions (Signed)
No change in your medications ° °Please schedule a follow up visit in 12 months but call sooner if needed  °

## 2020-09-30 DIAGNOSIS — D7389 Other diseases of spleen: Secondary | ICD-10-CM | POA: Diagnosis not present

## 2020-09-30 DIAGNOSIS — S20222A Contusion of left back wall of thorax, initial encounter: Secondary | ICD-10-CM | POA: Diagnosis not present

## 2020-09-30 DIAGNOSIS — W1839XA Other fall on same level, initial encounter: Secondary | ICD-10-CM | POA: Diagnosis not present

## 2020-09-30 DIAGNOSIS — D71 Functional disorders of polymorphonuclear neutrophils: Secondary | ICD-10-CM | POA: Diagnosis not present

## 2020-09-30 DIAGNOSIS — Z87891 Personal history of nicotine dependence: Secondary | ICD-10-CM | POA: Diagnosis not present

## 2020-09-30 DIAGNOSIS — K5730 Diverticulosis of large intestine without perforation or abscess without bleeding: Secondary | ICD-10-CM | POA: Diagnosis not present

## 2020-09-30 DIAGNOSIS — E119 Type 2 diabetes mellitus without complications: Secondary | ICD-10-CM | POA: Diagnosis not present

## 2020-09-30 DIAGNOSIS — M25559 Pain in unspecified hip: Secondary | ICD-10-CM | POA: Diagnosis not present

## 2020-09-30 DIAGNOSIS — S20221A Contusion of right back wall of thorax, initial encounter: Secondary | ICD-10-CM | POA: Diagnosis not present

## 2020-09-30 DIAGNOSIS — I700 Atherosclerosis of aorta: Secondary | ICD-10-CM | POA: Diagnosis not present

## 2020-09-30 DIAGNOSIS — I10 Essential (primary) hypertension: Secondary | ICD-10-CM | POA: Diagnosis not present

## 2020-10-03 DIAGNOSIS — R0789 Other chest pain: Secondary | ICD-10-CM | POA: Diagnosis not present

## 2020-10-03 DIAGNOSIS — R079 Chest pain, unspecified: Secondary | ICD-10-CM | POA: Diagnosis not present

## 2020-10-03 DIAGNOSIS — J439 Emphysema, unspecified: Secondary | ICD-10-CM | POA: Diagnosis not present

## 2020-10-03 DIAGNOSIS — E1165 Type 2 diabetes mellitus with hyperglycemia: Secondary | ICD-10-CM | POA: Diagnosis not present

## 2020-10-03 DIAGNOSIS — I700 Atherosclerosis of aorta: Secondary | ICD-10-CM | POA: Diagnosis not present

## 2020-10-03 DIAGNOSIS — M549 Dorsalgia, unspecified: Secondary | ICD-10-CM | POA: Diagnosis not present

## 2020-10-03 DIAGNOSIS — M19012 Primary osteoarthritis, left shoulder: Secondary | ICD-10-CM | POA: Diagnosis not present

## 2020-10-03 DIAGNOSIS — M546 Pain in thoracic spine: Secondary | ICD-10-CM | POA: Diagnosis not present

## 2020-10-03 DIAGNOSIS — I10 Essential (primary) hypertension: Secondary | ICD-10-CM | POA: Diagnosis not present

## 2020-10-03 DIAGNOSIS — Z299 Encounter for prophylactic measures, unspecified: Secondary | ICD-10-CM | POA: Diagnosis not present

## 2020-10-03 DIAGNOSIS — R0781 Pleurodynia: Secondary | ICD-10-CM | POA: Diagnosis not present

## 2020-10-04 ENCOUNTER — Other Ambulatory Visit: Payer: Self-pay

## 2020-10-04 ENCOUNTER — Ambulatory Visit (INDEPENDENT_AMBULATORY_CARE_PROVIDER_SITE_OTHER): Payer: Medicare Other | Admitting: Podiatry

## 2020-10-04 ENCOUNTER — Encounter: Payer: Self-pay | Admitting: Podiatry

## 2020-10-04 DIAGNOSIS — L84 Corns and callosities: Secondary | ICD-10-CM

## 2020-10-04 DIAGNOSIS — M79674 Pain in right toe(s): Secondary | ICD-10-CM

## 2020-10-04 DIAGNOSIS — M79675 Pain in left toe(s): Secondary | ICD-10-CM

## 2020-10-04 DIAGNOSIS — E1159 Type 2 diabetes mellitus with other circulatory complications: Secondary | ICD-10-CM

## 2020-10-04 DIAGNOSIS — B351 Tinea unguium: Secondary | ICD-10-CM

## 2020-10-06 ENCOUNTER — Ambulatory Visit: Payer: Medicare Other | Admitting: Physician Assistant

## 2020-10-06 DIAGNOSIS — E782 Mixed hyperlipidemia: Secondary | ICD-10-CM

## 2020-10-06 DIAGNOSIS — Z9889 Other specified postprocedural states: Secondary | ICD-10-CM

## 2020-10-06 DIAGNOSIS — Z79899 Other long term (current) drug therapy: Secondary | ICD-10-CM

## 2020-10-06 DIAGNOSIS — Z8639 Personal history of other endocrine, nutritional and metabolic disease: Secondary | ICD-10-CM

## 2020-10-06 DIAGNOSIS — M19071 Primary osteoarthritis, right ankle and foot: Secondary | ICD-10-CM

## 2020-10-06 DIAGNOSIS — E1165 Type 2 diabetes mellitus with hyperglycemia: Secondary | ICD-10-CM | POA: Diagnosis not present

## 2020-10-06 DIAGNOSIS — G8929 Other chronic pain: Secondary | ICD-10-CM

## 2020-10-06 DIAGNOSIS — M75101 Unspecified rotator cuff tear or rupture of right shoulder, not specified as traumatic: Secondary | ICD-10-CM

## 2020-10-06 DIAGNOSIS — M0609 Rheumatoid arthritis without rheumatoid factor, multiple sites: Secondary | ICD-10-CM

## 2020-10-06 DIAGNOSIS — I10 Essential (primary) hypertension: Secondary | ICD-10-CM | POA: Diagnosis not present

## 2020-10-06 DIAGNOSIS — I739 Peripheral vascular disease, unspecified: Secondary | ICD-10-CM

## 2020-10-06 DIAGNOSIS — I2510 Atherosclerotic heart disease of native coronary artery without angina pectoris: Secondary | ICD-10-CM

## 2020-10-06 DIAGNOSIS — M5030 Other cervical disc degeneration, unspecified cervical region: Secondary | ICD-10-CM

## 2020-10-06 DIAGNOSIS — J449 Chronic obstructive pulmonary disease, unspecified: Secondary | ICD-10-CM

## 2020-10-06 DIAGNOSIS — M24522 Contracture, left elbow: Secondary | ICD-10-CM

## 2020-10-06 DIAGNOSIS — M19041 Primary osteoarthritis, right hand: Secondary | ICD-10-CM

## 2020-10-06 NOTE — Progress Notes (Signed)
  Subjective:  Patient ID: [name], male    DOB: 1950-07-07,  MRN: [id number]  [name] presents to clinic today for at risk foot care. Pt has h/o NIDDM with PAD and callus(es) b/l feet and painful thick toenails that are difficult to trim. Painful toenails interfere with ambulation. Aggravating factors include wearing enclosed shoe gear. Pain is relieved with periodic professional debridement. Painful calluses are aggravated when weightbearing with and without shoegear. Pain is relieved with periodic professional debridement.  Patient states he had a fall from his bed on 09/30/2020. He went to ED to be evaluated. He did not have any fractures nor internal injuries.  He is followed closely by Vascular and last visit to Vascular Surgery was 07/31/2020.   He voices no new pedal problems on today's visit.  PCP is [name], MD , and last visit was 10/03/2020.  No Known Allergies  Review of Systems: Negative except as noted in the HPI. Objective:   Constitutional [name] is a pleasant 70 y.o. African American male, WD, WN in NAD. AAO x 3.   Vascular Capillary fill time to digits <3 seconds b/l lower extremities. Faintly palpable DP pulse(s) b/l lower extremities. Nonpalpable PT pulse(s) b/l lower extremities. Pedal hair absent. Lower extremity skin temperature gradient within normal limits. No pain with calf compression b/l. No edema noted b/l lower extremities. No ischemia or gangrene noted b/l lower extremities. No cyanosis or clubbing noted.  Neurologic Normal speech. Oriented to person, place, and time. Pt has subjective symptoms of neuropathy. Protective sensation intact 5/5 intact bilaterally with 10g monofilament b/l.  Dermatologic No open wounds b/l lower extremities. No interdigital macerations b/l lower extremities. Toenails 1-5 b/l elongated, discolored, dystrophic, thickened, crumbly with subungual debris and tenderness to dorsal palpation. Hyperkeratotic lesion(s) L  hallux and submet head 3 right foot.  No erythema, no edema, no drainage, no fluctuance.  Orthopedic: Normal muscle strength 5/5 to all lower extremity muscle groups bilaterally. No pain crepitus or joint limitation noted with ROM b/l lower extremities. Hallux valgus with bunion deformity noted b/l lower extremities.   Radiographs: None  ABIs: 07/31/2020: Right: 1.34 Left:  1.33  TBIs 07/31/2020: Right: 0.29 Left: 0.37  ABIs 04/12/2020: Right: 0.94 Left: 1.32  TBIs 04/12/2020: Right: 0.26 Left:  0.47  Assessment:   1. Pain due to onychomycosis of toenails of both feet   2. Callus   3. Type 2 diabetes mellitus with vascular disease ([place])    Plan:  -Examined patient. -ABIs performed 07/31/2020. Toe pressures are decreased on the left. He has no tissue loss on today's visit. He is followed closely by Vascular. He does not hesitate to contact Vascular with any concerns. -Continue diabetic foot care principles: inspect feet daily, monitor glucose as recommended by PCP and/or Endocrinologist, and follow prescribed diet per PCP, Endocrinologist and/or dietician. -Patient to continue soft, supportive shoe gear daily. -Toenails 1-5 b/l were debrided in length and girth with sterile nail nippers and dremel without iatrogenic bleeding.  -Callus(es) L hallux and submet head 3 right foot pared utilizing sterile scalpel blade without complication or incident. Total number debrided =2. -Patient to report any pedal injuries to medical professional immediately. -Patient/POA to call should there be question/concern in the interim.  Return in about 9 weeks (around 12/06/2020).  Marzetta Board, DPM

## 2020-10-17 DIAGNOSIS — I10 Essential (primary) hypertension: Secondary | ICD-10-CM | POA: Diagnosis not present

## 2020-10-17 DIAGNOSIS — M5412 Radiculopathy, cervical region: Secondary | ICD-10-CM | POA: Diagnosis not present

## 2020-10-17 DIAGNOSIS — G5621 Lesion of ulnar nerve, right upper limb: Secondary | ICD-10-CM | POA: Diagnosis not present

## 2020-10-17 DIAGNOSIS — G629 Polyneuropathy, unspecified: Secondary | ICD-10-CM | POA: Diagnosis not present

## 2020-10-23 ENCOUNTER — Other Ambulatory Visit: Payer: Self-pay | Admitting: Vascular Surgery

## 2020-10-24 DIAGNOSIS — Z981 Arthrodesis status: Secondary | ICD-10-CM | POA: Diagnosis not present

## 2020-10-24 DIAGNOSIS — M19012 Primary osteoarthritis, left shoulder: Secondary | ICD-10-CM | POA: Diagnosis not present

## 2020-10-24 DIAGNOSIS — M47812 Spondylosis without myelopathy or radiculopathy, cervical region: Secondary | ICD-10-CM | POA: Diagnosis not present

## 2020-10-24 DIAGNOSIS — M542 Cervicalgia: Secondary | ICD-10-CM | POA: Diagnosis not present

## 2020-11-01 NOTE — Progress Notes (Deleted)
Office Visit Note  Patient: [name]             Date of Birth: 08-30-1950           MRN: [id number]             PCP: Brett Chroman, MD Referring: Brett Chroman, MD Visit Date: 11/02/2020 Occupation: @GUAROCC @  Subjective:    History of Present Illness: [name] is a 70 y.o. male with history of seronegative rheumatoid arthritis, osteoarthritis, and DDD.   Activities of Daily Living:  Patient reports morning stiffness for *** {minute/hour:19697}.   Patient {ACTIONS;DENIES/REPORTS:21021675::"Denies"} nocturnal pain.  Difficulty dressing/grooming: {ACTIONS;DENIES/REPORTS:21021675::"Denies"} Difficulty climbing stairs: {ACTIONS;DENIES/REPORTS:21021675::"Denies"} Difficulty getting out of chair: {ACTIONS;DENIES/REPORTS:21021675::"Denies"} Difficulty using hands for taps, buttons, cutlery, and/or writing: {ACTIONS;DENIES/REPORTS:21021675::"Denies"}  No Rheumatology ROS completed.   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   Opioid dependence ([place]) 10/15/2018   Other long term (current) drug therapy 10/15/2018   Puncture wound of finger of left hand 08/08/2018   Puncture wound of finger, right, complicated, initial encounter 08/05/2018   DOE (dyspnea on exertion) 11/04/2017   Muscle cramps 07/04/2017   Contracture of left elbow 07/04/2017   High risk medication use 06/23/2017   Primary osteoarthritis of both hands 06/23/2017   Primary osteoarthritis of both feet 06/23/2017   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 factor ([place]) 06/23/2017   DDD (degenerative disc disease), cervical 06/23/2017   Rotator cuff syndrome of right shoulder 06/06/2017   History of  bilateral carpal tunnel release 06/06/2017   Ganglion cyst of wrist,  left 06/06/2017   Non-insulin dependent type 2 diabetes mellitus ([place]) 06/06/2017   Chest pain with high risk for cardiac etiology 04/05/2017   COPD with acute exacerbation ([place]) 03/13/2017   Plantar fasciitis 02/21/2017   Iliac artery stenosis, right ([place]) 02/12/2017   Hematochezia 02/10/2017   Abnormal CT of the abdomen 02/10/2017   Cervical spondylosis 10/11/2016   Bony growth 10/13/2015   Pain in joint, lower leg 03/02/2014   Discoloration of skin-Left dorsum foot 09/28/2013   Swelling of limb-Left Calf / Leg 09/28/2013   PVD (peripheral vascular disease) ([place]) 10/14/2012   Pain in limb- Left popliteal and calf 10/14/2012   Dyslipidemia, goal LDL below 70    Essential hypertension 03/05/2012   Secondary cardiomyopathy, 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/12/2011   Generalized osteoarthritis of multiple sites 02/06/2011   History of arterial bypass of lower limb 12/25/2010   Atherosclerosis of native arteries of extremity with intermittent claudication ([place]) 12/25/2010   Coronary artery calcification seen on CT scan 04/16/2008   COPD  GOLD II 04/16/2008   GASTROESOPHAGEAL REFLUX DISEASE 04/16/2008   Rheumatoid arthritis ([place]) 04/16/2008   DIABETES MELLITUS, BORDERLINE 04/16/2008    Past Medical History:  Diagnosis Date   Bronchitis    Carotid artery disease ([place])    Cervical spine disease    Neck surgery in 2007 with Dr. Joya Salm   COPD (chronic obstructive pulmonary disease) ([place])    Coronary atherosclerosis of native coronary artery    Nonobstrtuctive by catheterization 2007, 2013, 2021   DVT (deep venous thrombosis) ([place])    Essential hypertension    GERD (gastroesophageal reflux disease)    Hyperlipidemia    PAD (peripheral artery disease) ([place])    s/p left fem-pop bypass 2007;  PTA and stenting of right iliac artery 09/2011   Rheumatoid arthritis(714.0)    Type 2 diabetes mellitus ([place])     Family History  Problem Relation Age of Onset   Heart attack Mother 78   Deep vein thrombosis  Mother    Diabetes Mother    Hyperlipidemia Mother    Hypertension Mother    Peripheral vascular disease Mother    Stroke Father    Diabetes Father    Hyperlipidemia Father    Hypertension Father    Peripheral vascular disease Father    Diabetes Sister    Hyperlipidemia Sister    Hypertension Sister    Diabetes Brother    Hyperlipidemia Brother    Cancer Brother    Deep vein thrombosis Brother    Hypertension Brother    Peripheral vascular disease Brother    Hyperlipidemia Son    Cancer Son    Hypertension Son    Diabetes Daughter    Colon cancer Neg Hx    Past Surgical History:  Procedure Laterality Date   ABDOMINAL AORTAGRAM N/A 10/03/2011   Procedure: ABDOMINAL Maxcine Ham;  Surgeon: Conrad Alleman, MD;  [facility] CATH LAB;  Service: Cardiovascular;  Laterality: N/A;   ABDOMINAL AORTOGRAM W/LOWER EXTREMITY N/A 01/16/2017   Procedure: ABDOMINAL AORTOGRAM W/LOWER EXTREMITY;  Surgeon: Conrad Shaktoolik, MD;  Location: [place] CV LAB;  Service: Cardiovascular;  Laterality: N/A;   ABDOMINAL AORTOGRAM W/LOWER EXTREMITY Bilateral 08/04/2019   Procedure: ABDOMINAL AORTOGRAM W/LOWER EXTREMITY;  Surgeon: Marty Heck, MD;  Location: [place] CV LAB;  Service: Cardiovascular;  Laterality: Bilateral;   ABDOMINAL AORTOGRAM W/LOWER EXTREMITY N/A 03/09/2020   Procedure: ABDOMINAL AORTOGRAM W/LOWER EXTREMITY;  Surgeon: Marty Heck, MD;  Location: [place] CV LAB;  Service: Cardiovascular;  Laterality: N/A;   CERVICAL DISC SURGERY     COLONOSCOPY WITH PROPOFOL N/A 02/17/2017   Procedure: COLONOSCOPY WITH PROPOFOL;  Surgeon: Daneil Dolin, MD;  [facility];  Service: Endoscopy;  Laterality: N/A;  2:45pm   FEMORAL-POPLITEAL BYPASS GRAFT  11/06/2005   ILIAC ARTERY STENT  10-03-11   Right CIA stenting   LEFT HEART CATH AND CORONARY ANGIOGRAPHY N/A 04/14/2019   Procedure: LEFT HEART CATH AND CORONARY ANGIOGRAPHY;  Surgeon: Troy Sine, MD;  Location: [place] [facility]  CV LAB;  Service: Cardiovascular;  Laterality: N/A;   PERIPHERAL VASCULAR BALLOON ANGIOPLASTY Right 01/16/2017   Procedure: PERIPHERAL VASCULAR BALLOON ANGIOPLASTY;  Surgeon: Conrad Lee, MD;  Location: [place] CV LAB;  Service: Cardiovascular;  Laterality: Right;  common iliac    PERIPHERAL VASCULAR BALLOON ANGIOPLASTY  03/09/2020   Procedure: PERIPHERAL VASCULAR BALLOON ANGIOPLASTY;  Surgeon: Marty Heck, MD;  Location: [facility] CV LAB;  Service: Cardiovascular;;   PR VEIN BYPASS GRAFT,AORTO-FEM-POP  06-22-2010   Redo Left Fem-Pop   Repair of left arm fracture     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    Immunization History  Administered Date(s) Administered   Influenza, High Dose Seasonal PF 10/14/2016, 09/21/2018   Influenza-Unspecified 11/08/2008, 12/10/2019   Moderna Sars-Covid-2 Vaccination 02/19/2019, 03/22/2019, 11/23/2019   PPD Test 09/22/2007   Tdap 02/09/2001, 08/05/2018     Objective: Vital Signs: There were no vitals taken for this visit.   Physical Exam Vitals and nursing note reviewed.  Constitutional:      Appearance: He is well-developed.  HENT:     Head: Normocephalic and atraumatic.  Eyes:     Conjunctiva/sclera: Conjunctivae normal.     Pupils: Pupils are equal, round, and reactive to light.  Pulmonary:     Effort: Pulmonary effort is normal.  Abdominal:     Palpations: Abdomen is soft.  Musculoskeletal:     Cervical back: Normal range of motion and neck supple.  Skin:    General: Skin is warm and dry.     Capillary Refill: Capillary refill takes less than 2 seconds.  Neurological:     Mental Status: He is alert and oriented to person, place, and time.  Psychiatric:        Behavior: Behavior normal.     Musculoskeletal Exam: ***  CDAI Exam: CDAI Score: -- Patient Global: --; Provider Global: -- Swollen: --; Tender: -- Joint  Exam 11/02/2020   No joint exam has been documented for this visit   There is currently no information documented on the homunculus. Go to the Rheumatology activity and complete the homunculus joint exam.  Investigation: No additional findings.  Imaging: No results found.  Recent Labs: Lab Results  Component Value Date   WBC 8.1 08/02/2020   HGB 12.6 (L) 08/02/2020   PLT 335 08/02/2020   NA 142 08/02/2020   K 4.5 08/02/2020   CL 103 08/02/2020   CO2 24 08/02/2020   GLUCOSE 100 (H) 08/02/2020   BUN 16 08/02/2020   CREATININE 1.07 08/02/2020   BILITOT 0.3 08/02/2020   ALKPHOS 83 08/02/2020   AST 21 08/02/2020   ALT 14 08/02/2020   PROT 7.2 08/02/2020   ALBUMIN 4.5 08/02/2020   CALCIUM 9.6 08/02/2020   GFRAA 83 01/27/2020   QFTBGOLDPLUS Negative 10/19/2019    Speciality Comments: Prior therapy includes: methotrexate ( d/c alcohol use), Arava (d/c patient preference), [place] (patient declined)  Procedures:  No procedures performed Allergies: Patient has no known allergies.   Assessment / Plan:     Visit Diagnoses: 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 factor (HCC)  High risk medication use  Contracture of left elbow  Primary osteoarthritis of both hands  Chronic pain of left knee  Primary osteoarthritis of both feet  DDD (degenerative disc disease), cervical  Rotator cuff syndrome of right shoulder  History of  bilateral carpal tunnel release  Essential hypertension  Mixed hyperlipidemia  Atherosclerosis of native coronary artery of native heart without angina pectoris  PVD (peripheral vascular disease) (HCC)  History of diabetes mellitus  COPD  GOLD II  Orders: No orders of the defined types were placed in this encounter.  No orders of the defined types were placed in this encounter.     Follow-Up Instructions: No follow-ups on file.   Ofilia Neas, PA-C  Note - This record has been created using Dragon  software.  Chart creation errors have been sought, but may not always  have been located. Such creation errors do not reflect on  the standard of medical care.

## 2020-11-02 ENCOUNTER — Ambulatory Visit: Payer: Medicare Other | Admitting: Physician Assistant

## 2020-11-06 DIAGNOSIS — I10 Essential (primary) hypertension: Secondary | ICD-10-CM | POA: Diagnosis not present

## 2020-11-06 DIAGNOSIS — E1165 Type 2 diabetes mellitus with hyperglycemia: Secondary | ICD-10-CM | POA: Diagnosis not present

## 2020-11-14 DIAGNOSIS — E1165 Type 2 diabetes mellitus with hyperglycemia: Secondary | ICD-10-CM | POA: Diagnosis not present

## 2020-11-14 DIAGNOSIS — Z299 Encounter for prophylactic measures, unspecified: Secondary | ICD-10-CM | POA: Diagnosis not present

## 2020-11-14 DIAGNOSIS — I10 Essential (primary) hypertension: Secondary | ICD-10-CM | POA: Diagnosis not present

## 2020-11-14 DIAGNOSIS — K6389 Other specified diseases of intestine: Secondary | ICD-10-CM | POA: Diagnosis not present

## 2020-11-14 DIAGNOSIS — Z2821 Immunization not carried out because of patient refusal: Secondary | ICD-10-CM | POA: Diagnosis not present

## 2020-11-14 DIAGNOSIS — B351 Tinea unguium: Secondary | ICD-10-CM | POA: Diagnosis not present

## 2020-11-21 NOTE — Progress Notes (Signed)
Office Visit Note  Patient: [name]             Date of Birth: 03-May-1950           MRN: [id number]             PCP: [name], MD Referring: [name], MD Visit Date: 12/05/2020 Occupation: @GUAROCC @  Subjective:  Medication management.   History of Present Illness: [name] is a 70 y.o. male with a history of seronegative rheumatoid arthritis, osteoarthritis and degenerative disc disease.  He states he been taking Xeljanz 1 tablet daily.  He continues to have some discomfort in his bilateral shoulders, hands, left hip, left knee, left ankle.  He also continues to have discomfort in his cervical spine.  He has not noticed any joint swelling.  He goes to pain management for the neck pain.  Activities of Daily Living:  Patient reports morning stiffness for 5-6 minutes.   Patient Reports nocturnal pain.  Difficulty dressing/grooming: Denies Difficulty climbing stairs: Reports Difficulty getting out of chair: Reports Difficulty using hands for taps, buttons, cutlery, and/or writing: Reports  Review of Systems  Constitutional:  Negative for fatigue.  HENT:  Negative for mouth sores, mouth dryness and nose dryness.   Eyes:  Negative for pain, itching and dryness.  Respiratory:  Positive for shortness of breath and difficulty breathing.        COPD  Cardiovascular:  Negative for chest pain and palpitations.  Gastrointestinal:  Negative for blood in stool, constipation and diarrhea.  Endocrine: Negative for increased urination.  Genitourinary:  Negative for difficulty urinating.  Musculoskeletal:  Positive for joint pain, joint pain, joint swelling, myalgias, morning stiffness, muscle tenderness and myalgias.  Skin:  Negative for color change, rash, redness and sensitivity to sunlight.  Allergic/Immunologic: Negative for susceptible to infections.  Neurological:  Positive for dizziness and numbness. Negative for headaches, memory loss and weakness.  Hematological:   Positive for bruising/bleeding tendency. Negative for swollen glands.  Psychiatric/Behavioral:  Negative for depressed mood, confusion and sleep disturbance. The patient is not nervous/anxious.    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   Opioid dependence ([place]) 10/15/2018   Other long term (current) drug therapy 10/15/2018   Puncture wound of finger of left hand 08/08/2018   Puncture wound of finger, right, complicated, initial encounter 08/05/2018   DOE (dyspnea on exertion) 11/04/2017   Muscle cramps 07/04/2017   Contracture of left elbow 07/04/2017   High risk medication use 06/23/2017   Primary osteoarthritis of both hands 06/23/2017   Primary osteoarthritis of both feet 06/23/2017   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 factor ([place]) 06/23/2017   DDD (degenerative disc disease), cervical 06/23/2017   Rotator cuff syndrome of right shoulder 06/06/2017   History of  bilateral carpal tunnel release 06/06/2017   Ganglion cyst of wrist, left 06/06/2017   Non-insulin dependent type 2 diabetes mellitus ([place]) 06/06/2017   Chest pain with high risk for cardiac etiology 04/05/2017   COPD with acute exacerbation ([place]) 03/13/2017   Plantar fasciitis 02/21/2017   Iliac artery stenosis, right ([place]) 02/12/2017   Hematochezia 02/10/2017   Abnormal CT of the abdomen 02/10/2017   Cervical spondylosis 10/11/2016   Bony growth 10/13/2015   Pain in joint, lower leg  03/02/2014   Discoloration of skin-Left dorsum foot 09/28/2013   Swelling of limb-Left Calf / Leg 09/28/2013   PVD (peripheral vascular disease) ([place]) 10/14/2012   Pain in limb- Left popliteal and calf 10/14/2012   Dyslipidemia, goal LDL below 70    Essential hypertension 03/05/2012    Secondary cardiomyopathy, 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/12/2011   Generalized osteoarthritis of multiple sites 02/06/2011   History of arterial bypass of lower limb 12/25/2010   Atherosclerosis of native arteries of extremity with intermittent claudication ([place]) 12/25/2010   Coronary artery calcification seen on CT scan 04/16/2008   COPD  GOLD II 04/16/2008   GASTROESOPHAGEAL REFLUX DISEASE 04/16/2008   Rheumatoid arthritis ([place]) 04/16/2008   DIABETES MELLITUS, BORDERLINE 04/16/2008    Past Medical History:  Diagnosis Date   Bronchitis    Carotid artery disease ([place])    Cervical spine disease    Neck surgery in 2007 with Dr. Joya Salm   COPD (chronic obstructive pulmonary disease) ([place])    Coronary atherosclerosis of native coronary artery    Nonobstrtuctive by catheterization 2007, 2013, 2021   DVT (deep venous thrombosis) ([place])    Essential hypertension    GERD (gastroesophageal reflux disease)    Hyperlipidemia    PAD (peripheral artery disease) ([place])    s/p left fem-pop bypass 2007; PTA and stenting of right iliac artery 09/2011   Rheumatoid arthritis(714.0)    Type 2 diabetes mellitus ([place])     Family History  Problem Relation Age of Onset   Heart attack Mother 49   Deep vein thrombosis Mother    Diabetes Mother    Hyperlipidemia Mother    Hypertension Mother    Peripheral vascular disease Mother    Stroke Father    Diabetes Father    Hyperlipidemia Father    Hypertension Father    Peripheral vascular disease Father    Diabetes Sister    Hyperlipidemia Sister    Hypertension Sister    Diabetes Brother    Hyperlipidemia Brother    Cancer Brother    Deep vein thrombosis Brother    Hypertension Brother    Peripheral vascular disease Brother    Hyperlipidemia Son    Cancer Son    Hypertension Son    Diabetes Daughter    Colon cancer Neg Hx    Past Surgical History:  Procedure Laterality Date   ABDOMINAL AORTAGRAM N/A 10/03/2011   Procedure: ABDOMINAL Maxcine Ham;  Surgeon: Conrad Lynnwood, MD;  Location: [facility] CATH LAB;  Service: Cardiovascular;  Laterality: N/A;   ABDOMINAL AORTOGRAM W/LOWER EXTREMITY N/A 01/16/2017   Procedure: ABDOMINAL AORTOGRAM W/LOWER EXTREMITY;  Surgeon: Conrad Hudson, MD;  Location: [facility];  Service: Cardiovascular;  Laterality: N/A;   ABDOMINAL AORTOGRAM W/LOWER EXTREMITY Bilateral 08/04/2019   Procedure: ABDOMINAL AORTOGRAM W/LOWER EXTREMITY;  Surgeon: Marty Heck, MD;  Location: [name] [facility];  Service: Cardiovascular;  Laterality: Bilateral;   ABDOMINAL AORTOGRAM W/LOWER EXTREMITY N/A 03/09/2020   Procedure: ABDOMINAL AORTOGRAM W/LOWER EXTREMITY;  Surgeon: Marty Heck, MD;  Location: [facility];  Service: Cardiovascular;  Laterality: N/A;   CERVICAL DISC SURGERY     COLONOSCOPY WITH PROPOFOL N/A 02/17/2017   Procedure: COLONOSCOPY WITH PROPOFOL;  Surgeon: Daneil Dolin, MD;  [facility];  Service: Endoscopy;  Laterality: N/A;  2:45pm   FEMORAL-POPLITEAL BYPASS GRAFT  11/06/2005   ILIAC ARTERY STENT  10-03-11   Right CIA stenting   LEFT HEART CATH AND CORONARY ANGIOGRAPHY N/A 04/14/2019   Procedure:  LEFT HEART CATH AND CORONARY ANGIOGRAPHY;  Surgeon: Troy Sine, MD;  Location: [facility] [facility];  Service: Cardiovascular;  Laterality: N/A;   PERIPHERAL VASCULAR BALLOON ANGIOPLASTY Right 01/16/2017   Procedure: PERIPHERAL VASCULAR BALLOON ANGIOPLASTY;  Surgeon: Conrad Monsey, MD;  Location: [facility];  Service: Cardiovascular;  Laterality: Right;  common iliac    PERIPHERAL VASCULAR BALLOON ANGIOPLASTY  03/09/2020   Procedure: PERIPHERAL VASCULAR BALLOON ANGIOPLASTY;  Surgeon: Marty Heck, MD;  Location: [facility];  Service: Cardiovascular;;   PR VEIN BYPASS GRAFT,AORTO-FEM-POP  06-22-2010   Redo Left Fem-Pop   Repair of left arm fracture     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    Immunization History  Administered Date(s) Administered   Influenza, High Dose Seasonal PF 10/14/2016, 09/21/2018   Influenza-Unspecified 11/08/2008, 12/10/2019   Moderna Sars-Covid-2 Vaccination 02/19/2019, 03/22/2019, 11/23/2019   PPD Test 09/22/2007   Tdap 02/09/2001, 08/05/2018     Objective: Vital Signs: BP 136/83 (BP Location: Left Arm, Patient Position: Sitting, Cuff Size: Normal)   Pulse (!) 103   Ht 5\' 9"  (1.753 m)   Wt 161 lb 9.6 oz (73.3 kg)   BMI 23.86 kg/m    Physical Exam Vitals and nursing note reviewed.  Constitutional:      Appearance: He is well-developed.  HENT:     Head: Normocephalic and atraumatic.  Eyes:     Conjunctiva/sclera: Conjunctivae normal.     Pupils: Pupils are equal, round, and reactive to light.  Cardiovascular:     Rate and Rhythm: Normal rate and regular rhythm.     Heart sounds: Normal heart sounds.  Pulmonary:     Effort: Pulmonary effort is normal.     Breath sounds: Normal breath sounds.  Abdominal:     General: Bowel sounds are normal.     Palpations: Abdomen is soft.  Musculoskeletal:     Cervical back: Normal range of motion and neck supple.  Skin:    General: Skin is warm and dry.     Capillary Refill: Capillary refill takes less than 2 seconds.  Neurological:     Mental Status: He is alert and oriented to person, place, and time.  Psychiatric:        Behavior: Behavior normal.     Musculoskeletal Exam: C-spine was in good range of motion with some discomfort.  Shoulder joints with good range of motion with some discomfort.  He has a contracture in his right elbow with no synovitis.  He has significant contracture in his left elbow due to previous injury.  He has limited range of motion of bilateral wrist joints with no synovitis.  Synovial thickening was noted.  MCP thickening with no synovitis was noted.  He has limited extension of his PIP joints.  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   Hip joints and knee joints in good range of motion.  There was no tenderness over ankles or MTPs.  No synovitis was noted.  CDAI Exam: CDAI Score: 3.6  Patient Global: 3 mm; Provider Global: 3 mm Swollen: 0 ; Tender: 5  Joint Exam 12/05/2020      Right  Left  Glenohumeral   Tender   Tender  Cervical Spine   Tender     Knee      Tender  Ankle  Tender     Investigation: No additional findings.  Imaging: No results found.  Recent Labs: Lab Results  Component Value Date   WBC 8.1 08/02/2020   HGB 12.6 (L) 08/02/2020   PLT 335 08/02/2020   NA 142 08/02/2020   K 4.5 08/02/2020   CL 103 08/02/2020   CO2 24 08/02/2020   GLUCOSE 100 (H) 08/02/2020   BUN 16 08/02/2020   CREATININE 1.07 08/02/2020   BILITOT 0.3 08/02/2020   ALKPHOS 83 08/02/2020   AST 21 08/02/2020   ALT 14 08/02/2020   PROT 7.2 08/02/2020   ALBUMIN 4.5 08/02/2020   CALCIUM 9.6 08/02/2020   GFRAA 83 01/27/2020   QFTBGOLDPLUS Negative 10/19/2019    Speciality Comments: Prior therapy includes: methotrexate ( d/c alcohol use), Arava (d/c patient preference), Orenica (patient declined)  Procedures:  No procedures performed Allergies: Patient has no known allergies.   Assessment / Plan:     Visit Diagnoses: 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 factor ([place]) -patient had no synovitis on examination.  He is clinically doing well on Xeljanz 5 mg p.o. daily.  He continues to have some discomfort in his joints.  We will check sed rate today.  He has contracture in his elbows.  He had previous injury to his left elbow joint.  No synovitis was noted on examination today.  Plan: Sedimentation rate  High risk medication use - Xeljanz 5 mg 1 tablet by mouth daily.  Methotrexate discontinued due to alcohol use, Orencia declined by patient, treated with Morrie Sheldon initially at [facility] and CMP were normal in July 2022.  Hemoglobin was low.  TB gold was negative on October 19, 2019.  Lipid  panel on May 05, 2020 was normal.plan: CBC with Differential/Platelet, COMPLETE METABOLIC PANEL WITH GFR, QuantiFERON-TB Gold Plus.  We will check lipid panel with his next labs.  He was advised to hold Morrie Sheldon in case he develops an infection and restart after the infection resolves.  Information regarding realization was also placed in the AVS which she can discuss with his PCP.  Rotator cuff syndrome of right shoulder-he complains of discomfort in his bilateral shoulders.  Both shoulders with good range of motion.  Contracture of joint of both elbows - Unchanged.  He had previous injury to his left elbow joint.  Primary osteoarthritis of both hands-he has severe osteoarthritis of the hands and also underlying rheumatoid arthritis.  No synovitis was noted.  History of  bilateral carpal tunnel release  Chronic pain of left knee - X-ray showed mild osteoarthritis and moderate chondromalacia patella.  He complains of knee joint discomfort.  No warmth swelling or effusion was noted.  Primary osteoarthritis of both feet - X-rays are consistent with rheumatoid arthritis and osteoarthritis.  No radiographic progression was noted on May 05, 2020 when compared to the x-rays of 2019.  DDD (degenerative disc disease), cervical-he has painful range of motion.  He has been followed by pain management.  Essential hypertension-his blood pressure was normal today.  Mixed hyperlipidemia-increased risk of heart disease with rheumatoid arthritis was also discussed.  Dietary modifications and exercise were emphasized.  Atherosclerosis of native coronary artery of native heart without angina pectoris  PVD (peripheral vascular disease) ([place])  History of diabetes mellitus  COPD  GOLD II  Orders: Orders Placed This Encounter  Procedures   CBC with Differential/Platelet   COMPLETE METABOLIC PANEL WITH GFR   QuantiFERON-TB Gold Plus   Sedimentation rate    Meds ordered this  encounter  Medications    XELJANZ 5 MG TABS    Sig: Take 1 tablet (5 mg total) by mouth daily.    Dispense:  90 tablet    Refill:  0     Follow-Up Instructions: Return in about 3 months (around 03/06/2021) for Rheumatoid arthritis, Osteoarthritis.   Bo Merino, MD  Note - This record has been created using Editor, commissioning.  Chart creation errors have been sought, but may not always  have been located. Such creation errors do not reflect on  the standard of medical care.

## 2020-12-05 ENCOUNTER — Ambulatory Visit (INDEPENDENT_AMBULATORY_CARE_PROVIDER_SITE_OTHER): Payer: Medicare Other | Admitting: Rheumatology

## 2020-12-05 ENCOUNTER — Encounter: Payer: Self-pay | Admitting: Rheumatology

## 2020-12-05 ENCOUNTER — Other Ambulatory Visit: Payer: Self-pay

## 2020-12-05 VITALS — BP 136/83 | HR 103 | Ht 69.0 in | Wt 161.6 lb

## 2020-12-05 DIAGNOSIS — E782 Mixed hyperlipidemia: Secondary | ICD-10-CM

## 2020-12-05 DIAGNOSIS — M19072 Primary osteoarthritis, left ankle and foot: Secondary | ICD-10-CM

## 2020-12-05 DIAGNOSIS — I2510 Atherosclerotic heart disease of native coronary artery without angina pectoris: Secondary | ICD-10-CM | POA: Diagnosis not present

## 2020-12-05 DIAGNOSIS — M19042 Primary osteoarthritis, left hand: Secondary | ICD-10-CM

## 2020-12-05 DIAGNOSIS — Z8639 Personal history of other endocrine, nutritional and metabolic disease: Secondary | ICD-10-CM

## 2020-12-05 DIAGNOSIS — M5030 Other cervical disc degeneration, unspecified cervical region: Secondary | ICD-10-CM

## 2020-12-05 DIAGNOSIS — Z79899 Other long term (current) drug therapy: Secondary | ICD-10-CM

## 2020-12-05 DIAGNOSIS — Z9889 Other specified postprocedural states: Secondary | ICD-10-CM | POA: Diagnosis not present

## 2020-12-05 DIAGNOSIS — M75101 Unspecified rotator cuff tear or rupture of right shoulder, not specified as traumatic: Secondary | ICD-10-CM | POA: Diagnosis not present

## 2020-12-05 DIAGNOSIS — M0609 Rheumatoid arthritis without rheumatoid factor, multiple sites: Secondary | ICD-10-CM

## 2020-12-05 DIAGNOSIS — M19041 Primary osteoarthritis, right hand: Secondary | ICD-10-CM

## 2020-12-05 DIAGNOSIS — I10 Essential (primary) hypertension: Secondary | ICD-10-CM | POA: Diagnosis not present

## 2020-12-05 DIAGNOSIS — M24521 Contracture, right elbow: Secondary | ICD-10-CM | POA: Diagnosis not present

## 2020-12-05 DIAGNOSIS — I739 Peripheral vascular disease, unspecified: Secondary | ICD-10-CM

## 2020-12-05 DIAGNOSIS — M24522 Contracture, left elbow: Secondary | ICD-10-CM

## 2020-12-05 DIAGNOSIS — M79642 Pain in left hand: Secondary | ICD-10-CM

## 2020-12-05 DIAGNOSIS — M25562 Pain in left knee: Secondary | ICD-10-CM

## 2020-12-05 DIAGNOSIS — G8929 Other chronic pain: Secondary | ICD-10-CM

## 2020-12-05 DIAGNOSIS — J449 Chronic obstructive pulmonary disease, unspecified: Secondary | ICD-10-CM

## 2020-12-05 DIAGNOSIS — M19071 Primary osteoarthritis, right ankle and foot: Secondary | ICD-10-CM

## 2020-12-05 MED ORDER — XELJANZ 5 MG PO TABS: 1.0000 | ORAL_TABLET | Freq: Every day | ORAL | 0 refills | Status: DC

## 2020-12-05 NOTE — Patient Instructions (Addendum)
Standing Labs We placed an order today for your standing lab work.   Please have your standing labs drawn in March  and every 3 months If possible, please have your labs drawn 2 weeks prior to your appointment so that the provider can discuss your results at your appointment.  Please note that you may see your imaging and lab results in [place] before we have reviewed them. We may be awaiting multiple results to interpret others before contacting you. Please allow our office up to 72 hours to thoroughly review all of the results before contacting the office for clarification of your results.  We have open lab daily: Monday through Thursday from 1:30-4:30 PM and Friday from 1:30-4:00 PM at the office of Dr. Bo Merino, [facility].   Please be advised, all patients with office appointments requiring lab work will take precedent over walk-in lab work.  If possible, please come for your lab work on Monday and Friday afternoons, as you may experience shorter wait times. The office is located at [address] [place], [place], [place] [address] No appointment is necessary.   Labs are drawn by [facility]. Please bring your co-pay at the time of your lab draw.  You may receive a bill from [facility] for your lab work.  If you wish to have your labs drawn at another location, please call the office 24 hours in advance to send orders.  If you have any questions regarding directions or hours of operation,  please call [phone].   As a reminder, please drink plenty of water prior to coming for your lab work. Thanks!  Vaccines You are taking a medication(s) that can suppress your immune system.  The following immunizations are recommended: Flu annually Covid-19  Td/Tdap (tetanus, diphtheria, pertussis) every 10 years Pneumonia (Prevnar 15 then Pneumovax 23 at least 1 year apart.  Alternatively, can take Prevnar 20 without needing additional dose) Shingrix: 2 doses from 4 weeks to 6  months apart  Please check with your PCP to make sure you are up to date.  If you have signs or symptoms of an infection or start antibiotics: First, call your PCP for workup of your infection. Hold your medication through the infection, until you complete your antibiotics, and until symptoms resolve if you take the following: Injectable medication (Actemra, Benlysta, Cimzia, Cosentyx, Enbrel, Humira, Kevzara, Orencia, Remicade, Simponi, Stelara, Taltz, Tremfya) Methotrexate Leflunomide (Arava) Mycophenolate (Cellcept) Morrie Sheldon, Olumiant, or Rinvoq  Heart Disease Prevention   Your inflammatory disease increases your risk of heart disease which includes heart attack, stroke, atrial fibrillation (irregular heartbeats), high blood pressure, heart failure and atherosclerosis (plaque in the arteries).  It is important to reduce your risk by:   Keep blood pressure, cholesterol, and blood sugar at healthy levels   Smoking Cessation   Maintain a healthy weight  BMI 20-25   Eat a healthy diet  Plenty of fresh fruit, vegetables, and whole grains  Limit saturated fats, foods high in sodium, and added sugars  DASH and Mediterranean diet   Increase physical activity  Recommend moderate physically activity for 150 minutes per week/ 30 minutes a day for five days a week These can be broken up into three separate ten-minute sessions during the day.   Reduce Stress  Meditation, slow breathing exercises, yoga, coloring books  Dental visits twice a year

## 2020-12-06 DIAGNOSIS — E1165 Type 2 diabetes mellitus with hyperglycemia: Secondary | ICD-10-CM | POA: Diagnosis not present

## 2020-12-06 DIAGNOSIS — I10 Essential (primary) hypertension: Secondary | ICD-10-CM | POA: Diagnosis not present

## 2020-12-08 LAB — COMPLETE METABOLIC PANEL WITH GFR
AG Ratio: 1.4 (calc) (ref 1.0–2.5)
ALT: 12 U/L (ref 9–46)
AST: 16 U/L (ref 10–35)
Albumin: 4.1 g/dL (ref 3.6–5.1)
Alkaline phosphatase (APISO): 73 U/L (ref 35–144)
BUN: 12 mg/dL (ref 7–25)
CO2: 28 mmol/L (ref 20–32)
Calcium: 9.1 mg/dL (ref 8.6–10.3)
Chloride: 106 mmol/L (ref 98–110)
Creat: 0.88 mg/dL (ref 0.70–1.35)
Globulin: 3 g/dL (calc) (ref 1.9–3.7)
Glucose, Bld: 260 mg/dL — ABNORMAL HIGH (ref 65–99)
Potassium: 4.5 mmol/L (ref 3.5–5.3)
Sodium: 142 mmol/L (ref 135–146)
Total Bilirubin: 0.3 mg/dL (ref 0.2–1.2)
Total Protein: 7.1 g/dL (ref 6.1–8.1)
eGFR: 93 mL/min/{1.73_m2} (ref >=60)

## 2020-12-08 LAB — CBC WITH DIFFERENTIAL/PLATELET
Absolute Monocytes: 409 cells/uL (ref 200–950)
Basophils Absolute: 50 cells/uL (ref 0–200)
Basophils Relative: 0.9 %
Eosinophils Absolute: 90 cells/uL (ref 15–500)
Eosinophils Relative: 1.6 %
HCT: 40.1 % (ref 38.5–50.0)
Hemoglobin: 12.6 g/dL — ABNORMAL LOW (ref 13.2–17.1)
Lymphs Abs: 1294 cells/uL (ref 850–3900)
MCH: 27.4 pg (ref 27.0–33.0)
MCHC: 31.4 g/dL — ABNORMAL LOW (ref 32.0–36.0)
MCV: 87.2 fL (ref 80.0–100.0)
MPV: 10.5 fL (ref 7.5–12.5)
Monocytes Relative: 7.3 %
Neutro Abs: 3758 cells/uL (ref 1500–7800)
Neutrophils Relative %: 67.1 %
Platelets: 324 10*3/uL (ref 140–400)
RBC: 4.6 10*6/uL (ref 4.20–5.80)
RDW: 15.4 % — ABNORMAL HIGH (ref 11.0–15.0)
Total Lymphocyte: 23.1 %
WBC: 5.6 10*3/uL (ref 3.8–10.8)

## 2020-12-08 LAB — QUANTIFERON-TB GOLD PLUS
Mitogen-NIL: >10 IU/mL
NIL: 0.05 IU/mL
QuantiFERON-TB Gold Plus: NEGATIVE
TB1-NIL: 0 IU/mL
TB2-NIL: 0 IU/mL

## 2020-12-08 LAB — SEDIMENTATION RATE: Sed Rate: 6 mm/h (ref 0–20)

## 2020-12-08 NOTE — Progress Notes (Signed)
TB Gold is negative.

## 2020-12-12 ENCOUNTER — Ambulatory Visit (INDEPENDENT_AMBULATORY_CARE_PROVIDER_SITE_OTHER): Payer: Medicare Other | Admitting: Podiatry

## 2020-12-12 DIAGNOSIS — Z91199 Patient's noncompliance with other medical treatment and regimen due to unspecified reason: Secondary | ICD-10-CM

## 2020-12-12 NOTE — Progress Notes (Signed)
No show for appt. 

## 2020-12-15 DIAGNOSIS — Z79899 Other long term (current) drug therapy: Secondary | ICD-10-CM | POA: Diagnosis not present

## 2020-12-15 DIAGNOSIS — Z7984 Long term (current) use of oral hypoglycemic drugs: Secondary | ICD-10-CM | POA: Diagnosis not present

## 2020-12-15 DIAGNOSIS — J439 Emphysema, unspecified: Secondary | ICD-10-CM | POA: Diagnosis not present

## 2020-12-15 DIAGNOSIS — Z87891 Personal history of nicotine dependence: Secondary | ICD-10-CM | POA: Diagnosis not present

## 2020-12-15 DIAGNOSIS — K219 Gastro-esophageal reflux disease without esophagitis: Secondary | ICD-10-CM | POA: Diagnosis not present

## 2020-12-15 DIAGNOSIS — Z7951 Long term (current) use of inhaled steroids: Secondary | ICD-10-CM | POA: Diagnosis not present

## 2020-12-15 DIAGNOSIS — J441 Chronic obstructive pulmonary disease with (acute) exacerbation: Secondary | ICD-10-CM | POA: Diagnosis not present

## 2020-12-15 DIAGNOSIS — M069 Rheumatoid arthritis, unspecified: Secondary | ICD-10-CM | POA: Diagnosis not present

## 2020-12-15 DIAGNOSIS — R0602 Shortness of breath: Secondary | ICD-10-CM | POA: Diagnosis not present

## 2020-12-15 DIAGNOSIS — E119 Type 2 diabetes mellitus without complications: Secondary | ICD-10-CM | POA: Diagnosis not present

## 2020-12-15 DIAGNOSIS — I10 Essential (primary) hypertension: Secondary | ICD-10-CM | POA: Diagnosis not present

## 2020-12-15 DIAGNOSIS — R9431 Abnormal electrocardiogram [ECG] [EKG]: Secondary | ICD-10-CM | POA: Diagnosis not present

## 2020-12-27 ENCOUNTER — Telehealth: Payer: Self-pay

## 2020-12-27 NOTE — Telephone Encounter (Signed)
PA renewal initiated automatically by CoverMyMeds.  Submitted a Prior Authorization request to [facility] for [facility] via CoverMyMeds. Will update once we receive a response.   Key: [id number]

## 2020-12-27 NOTE — Telephone Encounter (Signed)
Received notification from [facility] regarding a prior authorization for [facility]. Authorization has been APPROVED from 11/27/2020 to 01/06/2022. Approval letter sent to scan center.  Authorization # [id number]

## 2021-01-05 DIAGNOSIS — I10 Essential (primary) hypertension: Secondary | ICD-10-CM | POA: Diagnosis not present

## 2021-01-05 DIAGNOSIS — E1165 Type 2 diabetes mellitus with hyperglycemia: Secondary | ICD-10-CM | POA: Diagnosis not present

## 2021-01-11 DIAGNOSIS — J449 Chronic obstructive pulmonary disease, unspecified: Secondary | ICD-10-CM | POA: Diagnosis not present

## 2021-01-11 DIAGNOSIS — M47812 Spondylosis without myelopathy or radiculopathy, cervical region: Secondary | ICD-10-CM | POA: Diagnosis not present

## 2021-01-11 DIAGNOSIS — Z79899 Other long term (current) drug therapy: Secondary | ICD-10-CM | POA: Diagnosis not present

## 2021-01-17 DIAGNOSIS — Z87891 Personal history of nicotine dependence: Secondary | ICD-10-CM | POA: Diagnosis not present

## 2021-01-17 DIAGNOSIS — J439 Emphysema, unspecified: Secondary | ICD-10-CM | POA: Diagnosis not present

## 2021-01-17 DIAGNOSIS — J8410 Pulmonary fibrosis, unspecified: Secondary | ICD-10-CM | POA: Diagnosis not present

## 2021-01-17 DIAGNOSIS — R079 Chest pain, unspecified: Secondary | ICD-10-CM | POA: Diagnosis not present

## 2021-01-17 DIAGNOSIS — Z7984 Long term (current) use of oral hypoglycemic drugs: Secondary | ICD-10-CM | POA: Diagnosis not present

## 2021-01-17 DIAGNOSIS — E119 Type 2 diabetes mellitus without complications: Secondary | ICD-10-CM | POA: Diagnosis not present

## 2021-01-17 DIAGNOSIS — I10 Essential (primary) hypertension: Secondary | ICD-10-CM | POA: Diagnosis not present

## 2021-01-17 DIAGNOSIS — Z79899 Other long term (current) drug therapy: Secondary | ICD-10-CM | POA: Diagnosis not present

## 2021-01-18 ENCOUNTER — Telehealth: Payer: Self-pay | Admitting: Cardiology

## 2021-01-18 DIAGNOSIS — J8410 Pulmonary fibrosis, unspecified: Secondary | ICD-10-CM | POA: Diagnosis not present

## 2021-01-18 DIAGNOSIS — J439 Emphysema, unspecified: Secondary | ICD-10-CM | POA: Diagnosis not present

## 2021-01-18 NOTE — Telephone Encounter (Signed)
Patient states her was in the [facility] emergency room and they told him he needs to follow up with his cardiologist.  He would like to more up his March appt.

## 2021-01-18 NOTE — Telephone Encounter (Signed)
Appointment given to see [name] tomorrow at 1:00 pm at the [place] office.  Patient aware that appointment is in [place]

## 2021-01-19 ENCOUNTER — Other Ambulatory Visit: Payer: Self-pay

## 2021-01-19 ENCOUNTER — Encounter: Payer: Self-pay | Admitting: Medical

## 2021-01-19 ENCOUNTER — Ambulatory Visit (INDEPENDENT_AMBULATORY_CARE_PROVIDER_SITE_OTHER): Payer: Medicare Other | Admitting: Medical

## 2021-01-19 VITALS — BP 132/76 | HR 88 | Ht 70.5 in | Wt 164.0 lb

## 2021-01-19 DIAGNOSIS — I739 Peripheral vascular disease, unspecified: Secondary | ICD-10-CM

## 2021-01-19 DIAGNOSIS — E782 Mixed hyperlipidemia: Secondary | ICD-10-CM

## 2021-01-19 DIAGNOSIS — I2510 Atherosclerotic heart disease of native coronary artery without angina pectoris: Secondary | ICD-10-CM | POA: Diagnosis not present

## 2021-01-19 DIAGNOSIS — R079 Chest pain, unspecified: Secondary | ICD-10-CM | POA: Diagnosis not present

## 2021-01-19 DIAGNOSIS — I25119 Atherosclerotic heart disease of native coronary artery with unspecified angina pectoris: Secondary | ICD-10-CM

## 2021-01-19 NOTE — Progress Notes (Signed)
Cardiology Office Note:    Date:  01/19/2021   ID:  [name], DOB [birth year], MRN [id number]  PCP:  [name], MD  [facility] Cardiologist:  Rozann Lesches, MD  [place] Electrophysiologist:  None   Referring MD: [name], MD   Chief Complaint: F/u chest pain  History of Present Illness:    [name] is a 71 y.o. male with a hx of carotid artery disease, nonobstructive CAD by cath in 2007, 2013 and 2021, HLD, PAD, DVT, COPD, DM2 who presents for ER follow-up .  Seen in the ER 1/11 for chest pain. HS trop 34>36>34. CTA with no PE, showed 2V CAD.   Today the patient reports he had chest pain that started Friday. He was using ice and a heat pad for the pain, but this was not helping. He felt his chest was swollen. Pain is on the left side and sharp, it comes and goes. Can occur at rest or with exertion. Has chronic SOB. No nausea or vomiting. No LLE, orthopnea, pnd. The chest pain continues to come and go. He has NTG, but has not been taking it. Says pain is reproducible on exam, also pain is worse when he goes to the gym and works out.   Past Medical History:  Diagnosis Date   Bronchitis    Carotid artery disease ([place])    Cervical spine disease    Neck surgery in 2007 with Dr. Joya Salm   COPD (chronic obstructive pulmonary disease) ([place])    Coronary atherosclerosis of native coronary artery    Nonobstrtuctive by catheterization 2007, 2013, 2021   DVT (deep venous thrombosis) ([place])    Essential hypertension    GERD (gastroesophageal reflux disease)    Hyperlipidemia    PAD (peripheral artery disease) ([place])    s/p left fem-pop bypass 2007; PTA and stenting of right iliac artery 09/2011   Rheumatoid arthritis(714.0)    Type 2 diabetes mellitus ([place])     Past Surgical History:  Procedure Laterality Date   ABDOMINAL AORTAGRAM N/A 10/03/2011   Procedure: ABDOMINAL Maxcine Ham;  Surgeon: Conrad [place], MD;  Location: [facility] CATH LAB;  Service: Cardiovascular;   Laterality: N/A;   ABDOMINAL AORTOGRAM W/LOWER EXTREMITY N/A 01/16/2017   Procedure: ABDOMINAL AORTOGRAM W/LOWER EXTREMITY;  Surgeon: Conrad [place], MD;  Location: [place] CV LAB;  Service: Cardiovascular;  Laterality: N/A;   ABDOMINAL AORTOGRAM W/LOWER EXTREMITY Bilateral 08/04/2019   Procedure: ABDOMINAL AORTOGRAM W/LOWER EXTREMITY;  Surgeon: Marty Heck, MD;  Location: [place] CV LAB;  Service: Cardiovascular;  Laterality: Bilateral;   ABDOMINAL AORTOGRAM W/LOWER EXTREMITY N/A 03/09/2020   Procedure: ABDOMINAL AORTOGRAM W/LOWER EXTREMITY;  Surgeon: Marty Heck, MD;  Location: [place] CV LAB;  Service: Cardiovascular;  Laterality: N/A;   CERVICAL DISC SURGERY     COLONOSCOPY WITH PROPOFOL N/A 02/17/2017   Procedure: COLONOSCOPY WITH PROPOFOL;  Surgeon: Daneil Dolin, MD;  Location: [facility];  Service: Endoscopy;  Laterality: N/A;  2:45pm   FEMORAL-POPLITEAL BYPASS GRAFT  11/06/2005   ILIAC ARTERY STENT  10-03-11   Right CIA stenting   LEFT HEART CATH AND CORONARY ANGIOGRAPHY N/A 04/14/2019   Procedure: LEFT HEART CATH AND CORONARY ANGIOGRAPHY;  Surgeon: Troy Sine, MD;  Location: [place] CV LAB;  Service: Cardiovascular;  Laterality: N/A;   PERIPHERAL VASCULAR BALLOON ANGIOPLASTY Right 01/16/2017   Procedure: PERIPHERAL VASCULAR BALLOON ANGIOPLASTY;  Surgeon: Conrad [place], MD;  Location: [place] CV LAB;  Service: Cardiovascular;  Laterality: Right;  common iliac    PERIPHERAL VASCULAR BALLOON ANGIOPLASTY  03/09/2020   Procedure: PERIPHERAL VASCULAR BALLOON ANGIOPLASTY;  Surgeon: Marty Heck, MD;  Location: [place] CV LAB;  Service: Cardiovascular;;   PR VEIN BYPASS GRAFT,AORTO-FEM-POP  06-22-2010   Redo Left Fem-Pop   Repair of left arm fracture     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    atorvastatin (LIPITOR) 20 MG tablet Take 20 mg by mouth daily.   budesonide-formoterol (SYMBICORT) 160-4.5 MCG/ACT inhaler Take 2 puffs first thing in am and then another 2 puffs about 12 hours later.   cilostazol (PLETAL) 100 MG tablet TAKE 1 TABLET(100 MG) BY MOUTH TWICE DAILY   clopidogrel (PLAVIX) 75 MG tablet Take 1 tablet (75 mg total) by mouth daily.   Dexlansoprazole (DEXILANT) 30 MG capsule Take 30- 60 min before your first and last meals of the day (Patient taking differently: Take 30 mg by mouth daily.)   HYDROcodone-acetaminophen (NORCO) 7.5-325 MG per tablet Take 1 tablet by mouth 2 (two) times daily as needed for moderate pain.   isosorbide dinitrate (ISORDIL) 30 MG tablet Take 30 mg by mouth daily.   JANUVIA 100 MG tablet Take 100 mg by mouth daily.   JARDIANCE 25 MG TABS tablet Take 25 mg by mouth daily.   losartan (COZAAR) 25 MG tablet Take 25 mg by mouth daily.   metFORMIN (GLUCOPHAGE) 1000 MG tablet Take 1,000 mg by mouth 2 (two) times daily with a meal.   methylcellulose oral powder Take by mouth daily.   nitroGLYCERIN (NITROSTAT) 0.4 MG SL tablet Place 1 tablet (0.4 mg total) under the tongue every 5 (five) minutes as needed for chest pain.   ONE TOUCH ULTRA TEST test strip    OneTouch Delica Lancets 65Y MISC Apply topically.   PROAIR HFA 108 (90 BASE) MCG/ACT inhaler Inhale 2 puffs into the lungs every 6 (six) hours as needed for wheezing or shortness of breath.    sildenafil (REVATIO) 20 MG tablet Take 40-60 mg by mouth daily as needed for erectile dysfunction.   XELJANZ 5 MG TABS Take 1 tablet (5 mg total) by mouth daily.     Allergies:   Patient has no known allergies.   Social History   Socioeconomic History   Marital status: Single    Spouse name: Not on file   Number of children: Not on file   Years of education: Not on file   Highest education level: Not on file  Occupational History   Not on file  Tobacco Use   Smoking status: Former    Packs/day:  1.00    Years: 45.00    Pack years: 45.00    Types: Cigarettes, Cigars    Quit date: 09/01/2011    Years since quitting: 9.3   Smokeless tobacco: Never  Vaping Use   Vaping Use: Never used  Substance and Sexual Activity   Alcohol use: No    Alcohol/week: 0.0 standard drinks   Drug use: No   Sexual activity: Not Currently    Birth control/protection: None  Other Topics Concern   Not on file  Social History Narrative   The patient lives in [place].    Social Determinants of Health   Financial Resource Strain: Not on file  Food Insecurity: Not  on file  Transportation Needs: Not on file  Physical Activity: Not on file  Stress: Not on file  Social Connections: Not on file     Family History: The patient's family history includes Cancer in his brother and son; Deep vein thrombosis in his brother and mother; Diabetes in his brother, daughter, father, mother, and sister; Heart attack (age of onset: 70) in his mother; Hyperlipidemia in his brother, father, mother, sister, and son; Hypertension in his brother, father, mother, sister, and son; Peripheral vascular disease in his brother, father, and mother; Stroke in his father. There is no history of Colon cancer.  ROS:   Please see the history of present illness.     All other systems reviewed and are negative.  EKGs/Labs/Other Studies Reviewed:    The following studies were reviewed today:  Cardiac catheterization 04/14/2019: Prox LAD to Mid LAD lesion is 20% stenosed. Mid LAD lesion is 30% stenosed. Prox RCA lesion is 30% stenosed.   Mild non-obstructive CAD with 20 and 30% irregularities in the proximal to mid LAD; normal ramus intermediate and left circumflex coronary arteries;  and RCA with smooth 30% proximal narrowing with distal vessel tortuosity.   Low normal global LV function with EF estimated at 50%; LVEDP 14 mmHg.   Lower extremity arterial Dopplers 04/12/2020: Summary:  Right: Near normal examination. Patent stent with  no evidence of stenosis  in the Right common iliac artery. No focal stenosis identified in the SFA.   Left: Patent left fem-pop bypass graft.    Carotid Dopplers [facility]) 05/24/2020: IMPRESSION:  Bilateral carotid atherosclerosis. No hemodynamically significant  ICA stenosis. Degree of narrowing less than 50% bilaterally by  ultrasound criteria.   Patent antegrade vertebral flow bilaterally    ABIs 07/31/2020: Summary:  Right: Resting right ankle-brachial index indicates noncompressible right  lower extremity arteries. The right toe-brachial index is abnormal.   Left: Resting left ankle-brachial index indicates noncompressible left  lower extremity arteries. The left toe-brachial index is abnormal.    EKG:  EKG is  ordered today.  The ekg ordered today demonstrates NSR, 99bpm, TW changes inferior leads, no changes  Recent Labs: 08/14/2020: TSH 1.060 12/05/2020: ALT 12; BUN 12; Creat 0.88; Hemoglobin 12.6; Platelets 324; Potassium 4.5; Sodium 142  Recent Lipid Panel    Component Value Date/Time   CHOL 148 05/05/2020 0000   CHOL 160 11/09/2019 1032   TRIG 49 05/05/2020 0000   HDL 63 05/05/2020 0000   HDL 61 11/09/2019 1032   CHOLHDL 2.3 05/05/2020 0000   LDLCALC 71 05/05/2020 0000      Physical Exam:    VS:  BP 132/76    Pulse 88    Ht 5' 10.5" (1.791 m)    Wt 164 lb (74.4 kg)    SpO2 96%    BMI 23.20 kg/m     Wt Readings from Last 3 Encounters:  01/19/21 164 lb (74.4 kg)  12/05/20 161 lb 9.6 oz (73.3 kg)  09/28/20 166 lb 0.6 oz (75.3 kg)     GEN:  Well nourished, well developed in no acute distress HEENT: Normal NECK: No JVD; No carotid bruits LYMPHATICS: No lymphadenopathy CARDIAC: RRR, no murmurs, rubs, gallops RESPIRATORY:  Clear to auscultation without rales, wheezing or rhonchi  ABDOMEN: Soft, non-tender, non-distended MUSCULOSKELETAL:  No edema; No deformity  SKIN: Warm and dry NEUROLOGIC:  Alert and oriented x 3 PSYCHIATRIC:  Normal affect    ASSESSMENT:    1. Chest pain of uncertain etiology  2. Coronary artery calcification seen on CT scan   3. Coronary artery disease involving native coronary artery of native heart with angina pectoris ([place])   4. Hyperlipidemia, mixed   5. Peripheral vascular disease, unspecified ([place])    PLAN:    In order of problems listed above:  Chest pain Mild nonobstructive CAD Recent ER visit to [facility] for chest pain. HS trop 34>36>34. Chest CTA with no PE and 2V CAD. Today the patient reports sharp intermittent chest pain that is reproducible on exam and worse with arm movement at the gym. Discussed treatments for MSK pain. Last heart cath was in 04/2019 showing mild nonobstructive CAD. EKG today with no new changes. I suspect mostly MSK chest pain. We will bring him back in 2 months to evaluate pain and maybe consider medication change vs stress test at that time if pain is persistent.   HLD LDL 74 last year, goal <70. Will re-check fasting lipids and LFTs. Continue Lipitor.   HTN BP good. Continue current medications.   PAD  S/p left sided common femoral to popliteal bypass and right mid SFA angioplasty with drug-coated balloon and right proximal anterior tibial artery angioplasty by Dr. Carlis Abbott. Continue on dual antiplatelet therapy and statin.  Disposition: Follow up in 2-3 month(s) with MD/APP    Signed, Aleja Yearwood Ninfa Meeker, PA-C  01/19/2021 4:28 PM    [facility]

## 2021-01-19 NOTE — Patient Instructions (Signed)
Medication Instructions:  Your physician recommends that you continue on your current medications as directed. Please refer to the Current Medication list given to you today.   Labwork: Fasting Lipids, LFT'S  Testing/Procedures: None today  Follow-Up: 2 months  Any Other Special Instructions Will Be Listed Below (If Applicable).  If you need a refill on your cardiac medications before your next appointment, please call your pharmacy.

## 2021-01-25 ENCOUNTER — Other Ambulatory Visit (HOSPITAL_COMMUNITY)
Admission: RE | Admit: 2021-01-25 | Discharge: 2021-01-25 | Disposition: A | Discharge status: Home / Self Care | Payer: Medicare Other | Source: Ambulatory Visit | Attending: Medical | Admitting: Medical

## 2021-01-25 ENCOUNTER — Other Ambulatory Visit: Payer: Self-pay

## 2021-01-25 DIAGNOSIS — I25119 Atherosclerotic heart disease of native coronary artery with unspecified angina pectoris: Secondary | ICD-10-CM | POA: Diagnosis not present

## 2021-01-25 DIAGNOSIS — J449 Chronic obstructive pulmonary disease, unspecified: Secondary | ICD-10-CM | POA: Diagnosis not present

## 2021-01-25 DIAGNOSIS — I2510 Atherosclerotic heart disease of native coronary artery without angina pectoris: Secondary | ICD-10-CM | POA: Diagnosis not present

## 2021-01-25 DIAGNOSIS — Z299 Encounter for prophylactic measures, unspecified: Secondary | ICD-10-CM | POA: Diagnosis not present

## 2021-01-25 DIAGNOSIS — I739 Peripheral vascular disease, unspecified: Secondary | ICD-10-CM | POA: Diagnosis not present

## 2021-01-25 LAB — HEPATIC FUNCTION PANEL
ALT: 16 U/L (ref 0–44)
AST: 20 U/L (ref 15–41)
Albumin: 4.5 g/dL (ref 3.5–5.0)
Alkaline Phosphatase: 79 U/L (ref 38–126)
Bilirubin, Direct: <0.1 mg/dL (ref 0.0–0.2)
Total Bilirubin: 0.5 mg/dL (ref 0.3–1.2)
Total Protein: 8.3 g/dL — ABNORMAL HIGH (ref 6.5–8.1)

## 2021-01-25 LAB — LIPID PANEL
Cholesterol: 182 mg/dL (ref 0–200)
HDL: 68 mg/dL (ref >40)
LDL Cholesterol: 97 mg/dL (ref 0–99)
Total CHOL/HDL Ratio: 2.7 RATIO
Triglycerides: 86 mg/dL (ref <150)
VLDL: 17 mg/dL (ref 0–40)

## 2021-01-30 ENCOUNTER — Ambulatory Visit (INDEPENDENT_AMBULATORY_CARE_PROVIDER_SITE_OTHER)
Admission: RE | Admit: 2021-01-30 | Discharge: 2021-01-30 | Disposition: A | Discharge status: Home / Self Care | Payer: Medicare Other | Source: Ambulatory Visit | Attending: Vascular Surgery | Admitting: Vascular Surgery

## 2021-01-30 ENCOUNTER — Ambulatory Visit (HOSPITAL_COMMUNITY)
Admission: RE | Admit: 2021-01-30 | Discharge: 2021-01-30 | Disposition: A | Discharge status: Home / Self Care | Payer: Medicare Other | Source: Ambulatory Visit | Attending: Physician Assistant | Admitting: Physician Assistant

## 2021-01-30 ENCOUNTER — Other Ambulatory Visit: Payer: Self-pay

## 2021-01-30 ENCOUNTER — Ambulatory Visit (INDEPENDENT_AMBULATORY_CARE_PROVIDER_SITE_OTHER): Payer: Medicare Other | Admitting: Physician Assistant

## 2021-01-30 VITALS — BP 129/85 | HR 94 | Temp 94.4°F | Ht 70.6 in | Wt 161.1 lb

## 2021-01-30 DIAGNOSIS — Z95828 Presence of other vascular implants and grafts: Secondary | ICD-10-CM

## 2021-01-30 DIAGNOSIS — I70213 Atherosclerosis of native arteries of extremities with intermittent claudication, bilateral legs: Secondary | ICD-10-CM | POA: Diagnosis not present

## 2021-01-30 DIAGNOSIS — I739 Peripheral vascular disease, unspecified: Secondary | ICD-10-CM

## 2021-01-30 NOTE — Progress Notes (Signed)
Office Note     CC:  follow up Requesting Provider:  [name], MD  HPI: [name] is a 71 y.o. (02-24-1950) male who presents for follow up of PAD. He has history of left common femoral to above-the-knee popliteal bypass in 2007.  He also has a right common iliac artery stent placed in 2013. His last intervention was March 09, 2020 when he underwent right mid SFA angioplasty as well as angioplasty of proximal right ATA by Dr. Carlis Abbott. This was performed due to third toe ulcer. At his last visit in July of 2022 his wounds had healed.    Today he says overall he is doing well. He has been having pains all over in upper and lower extremities as well as chest and stomach. He was just seen by Cardiologist last week because of frequent chest pains. 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 He was told his stomach pain was likely gas related and is now taking something OTC which is helping with this. In regard to his legs his pain mostly occurs at night. Says it mostly happens when he stretches his legs out. Describes it as a pulling/cramping pain. He denies any further nonhealing wounds, rest pain, or claudication. He remains active going to the gym most mornings, works, and does yard work on the side.     The pt is on a statin for cholesterol management.  The pt is on a daily aspirin.   Other AC:  Plavix The pt is on ARB for hypertension.   The pt is diabetic.   Tobacco hx: Former, 2013  Past Medical History:  Diagnosis Date   Bronchitis    Carotid artery disease ([place])    Cervical spine disease    Neck surgery in 2007 with Dr. Joya Salm   COPD (chronic obstructive pulmonary disease) ([place])    Coronary atherosclerosis of native coronary artery    Nonobstrtuctive by catheterization 2007, 2013, 2021   DVT (deep venous thrombosis) ([place])    Essential hypertension    GERD (gastroesophageal reflux disease)    Hyperlipidemia    PAD (peripheral artery disease)  ([place])    s/p left fem-pop bypass 2007; PTA and stenting of right iliac artery 09/2011   Rheumatoid arthritis(714.0)    Type 2 diabetes mellitus ([place])     Past Surgical History:  Procedure Laterality Date   ABDOMINAL AORTAGRAM N/A 10/03/2011   Procedure: ABDOMINAL Maxcine Ham;  Surgeon: Conrad Socorro, MD;  Location: [facility] CATH LAB;  Service: Cardiovascular;  Laterality: N/A;   ABDOMINAL AORTOGRAM W/LOWER EXTREMITY N/A 01/16/2017   Procedure: ABDOMINAL AORTOGRAM W/LOWER EXTREMITY;  Surgeon: Conrad Pringle, MD;  Location: [facility];  Service: Cardiovascular;  Laterality: N/A;   ABDOMINAL AORTOGRAM W/LOWER EXTREMITY Bilateral 08/04/2019   Procedure: ABDOMINAL AORTOGRAM W/LOWER EXTREMITY;  Surgeon: Marty Heck, MD;  Location: [facility];  Service: Cardiovascular;  Laterality: Bilateral;   ABDOMINAL AORTOGRAM W/LOWER EXTREMITY N/A 03/09/2020   Procedure: ABDOMINAL AORTOGRAM W/LOWER EXTREMITY;  Surgeon: Marty Heck, MD;  Location: [facility];  Service: Cardiovascular;  Laterality: N/A;   CERVICAL DISC SURGERY     COLONOSCOPY WITH PROPOFOL N/A 02/17/2017   Procedure: COLONOSCOPY WITH PROPOFOL;  Surgeon: Daneil Dolin, MD;  Location: [facility];  Service: Endoscopy;  Laterality: N/A;  2:45pm   FEMORAL-POPLITEAL BYPASS GRAFT  11/06/2005   ILIAC ARTERY STENT  10-03-11   Right CIA stenting   LEFT HEART CATH  AND CORONARY ANGIOGRAPHY N/A 04/14/2019   Procedure: LEFT HEART CATH AND CORONARY ANGIOGRAPHY;  Surgeon: Troy Sine, MD;  Location: [facility];  Service: Cardiovascular;  Laterality: N/A;   PERIPHERAL VASCULAR BALLOON ANGIOPLASTY Right 01/16/2017   Procedure: PERIPHERAL VASCULAR BALLOON ANGIOPLASTY;  Surgeon: Conrad Adena, MD;  Location: [facility];  Service: Cardiovascular;  Laterality: Right;  common iliac    PERIPHERAL VASCULAR BALLOON ANGIOPLASTY  03/09/2020   Procedure: PERIPHERAL VASCULAR BALLOON ANGIOPLASTY;  Surgeon: Marty Heck,  MD;  Location: [facility];  Service: Cardiovascular;;   PR VEIN BYPASS GRAFT,AORTO-FEM-POP  06-22-2010   Redo Left Fem-Pop   Repair of left arm fracture     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     SURGERY SCROTAL / TESTICULAR      Social History   Socioeconomic History   Marital status: Single    Spouse name: Not on file   Number of children: Not on file   Years of education: Not on file   Highest education level: Not on file  Occupational History   Not on file  Tobacco Use   Smoking status: Former    Packs/day: 1.00    Years: 45.00    Pack years: 45.00    Types: Cigarettes, Cigars    Quit date: 09/01/2011    Years since quitting: 9.4   Smokeless tobacco: Never  Vaping Use   Vaping Use: Never used  Substance and Sexual Activity   Alcohol use: No    Alcohol/week: 0.0 standard drinks   Drug use: No   Sexual activity: Not Currently    Birth control/protection: None  Other Topics Concern   Not on file  Social History Narrative   The patient lives in [place].    Social Determinants of Health   Financial Resource Strain: Not on file  Food Insecurity: Not on file  Transportation Needs: Not on file  Physical Activity: Not on file  Stress: Not on file  Social Connections: Not on file  Intimate Partner Violence: Not on file    Family History  Problem Relation Age of Onset   Heart attack Mother 7   Deep vein thrombosis Mother    Diabetes Mother    Hyperlipidemia Mother    Hypertension Mother    Peripheral vascular disease Mother    Stroke Father    Diabetes Father    Hyperlipidemia Father    Hypertension Father    Peripheral vascular disease Father    Diabetes Sister    Hyperlipidemia Sister    Hypertension Sister    Diabetes Brother    Hyperlipidemia Brother    Cancer Brother    Deep vein thrombosis Brother    Hypertension Brother    Peripheral vascular disease Brother    Hyperlipidemia Son    Cancer Son    Hypertension Son    Diabetes Daughter    Colon  cancer Neg Hx     Current Outpatient Medications  Medication Sig Dispense Refill   aspirin EC 81 MG tablet Take 1 tablet (81 mg total) by mouth daily.     atorvastatin (LIPITOR) 20 MG tablet Take 20 mg by mouth daily.     budesonide-formoterol (SYMBICORT) 160-4.5 MCG/ACT inhaler Take 2 puffs first thing in am and then another 2 puffs about 12 hours later. 10.2 g 11   cilostazol (PLETAL) 100 MG tablet TAKE 1 TABLET(100 MG) BY MOUTH TWICE DAILY 60 tablet 5   clopidogrel (PLAVIX) 75 MG tablet Take  1 tablet (75 mg total) by mouth daily. 30 tablet 11   Dexlansoprazole (DEXILANT) 30 MG capsule Take 30- 60 min before your first and last meals of the day (Patient taking differently: Take 30 mg by mouth daily.) 60 capsule 11   HYDROcodone-acetaminophen (NORCO) 7.5-325 MG per tablet Take 1 tablet by mouth 2 (two) times daily as needed for moderate pain.     isosorbide dinitrate (ISORDIL) 30 MG tablet Take 30 mg by mouth daily.     JANUVIA 100 MG tablet Take 100 mg by mouth daily.     JARDIANCE 25 MG TABS tablet Take 25 mg by mouth daily.     losartan (COZAAR) 25 MG tablet Take 25 mg by mouth daily.     metFORMIN (GLUCOPHAGE) 1000 MG tablet Take 1,000 mg by mouth 2 (two) times daily with a meal.     nitroGLYCERIN (NITROSTAT) 0.4 MG SL tablet Place 1 tablet (0.4 mg total) under the tongue every 5 (five) minutes as needed for chest pain. 25 tablet 3   ONE TOUCH ULTRA TEST test strip      OneTouch Delica Lancets 58N MISC Apply topically.     PROAIR HFA 108 (90 BASE) MCG/ACT inhaler Inhale 2 puffs into the lungs every 6 (six) hours as needed for wheezing or shortness of breath.      sildenafil (REVATIO) 20 MG tablet Take 40-60 mg by mouth daily as needed for erectile dysfunction.     Tiotropium Bromide Monohydrate (SPIRIVA RESPIMAT) 2.5 MCG/ACT AERS Inhale 2 puffs into the lungs daily. 4 g 11   XELJANZ 5 MG TABS Take 1 tablet (5 mg total) by mouth daily. 90 tablet 0   methylcellulose oral powder Take by  mouth daily. (Patient not taking: Reported on 01/30/2021)     No current facility-administered medications for this visit.    No Known Allergies   REVIEW OF SYSTEMS:   [X]  denotes positive finding, [ ]  denotes negative finding Cardiac  Comments:  Chest pain or chest pressure:    Shortness of breath upon exertion:    Short of breath when lying flat:    Irregular heart rhythm:        Vascular    Pain in calf, thigh, or hip brought on by ambulation:    Pain in feet at night that wakes you up from your sleep:     Blood clot in your veins:    Leg swelling:         Pulmonary    Oxygen at home:    Productive cough:     Wheezing:         Neurologic    Sudden weakness in arms or legs:     Sudden numbness in arms or legs:     Sudden onset of difficulty speaking or slurred speech:    Temporary loss of vision in one eye:     Problems with dizziness:         Gastrointestinal    Blood in stool:     Vomited blood:         Genitourinary    Burning when urinating:     Blood in urine:        Psychiatric    Major depression:         Hematologic    Bleeding problems:    Problems with blood clotting too easily:        Skin    Rashes or ulcers:  Constitutional    Fever or chills:      PHYSICAL EXAMINATION:  Vitals:   01/30/21 1035  BP: 129/85  Pulse: 94  Temp: (!) 94.4 F (34.7 C)  Weight: 161 lb 1.6 oz (73.1 kg)  Height: 5' 10.6" (1.793 m)    General:  WDWN in NAD; vital signs documented above Gait: Normal HENT: WNL, normocephalic Pulmonary: normal non-labored breathing , without wheezing Cardiac: regular HR, without  Murmurs without carotid bruit Abdomen: soft, NT, no masses Vascular Exam/Pulses:  Right Left  Radial 2+ (normal) 2+ (normal)  Femoral 2+ (normal) 2+ (normal)  Popliteal Not palpable Not palpable  DP 2+ (normal) 1+ (weak)  PT Not palpable Not palpable   Extremities: without ischemic changes, without Gangrene , without cellulitis; without  open wounds;  Musculoskeletal: no muscle wasting or atrophy  Neurologic: A&O X 3;  No focal weakness or paresthesias are detected Psychiatric:  The pt has Normal affect.   Non-Invasive Vascular Imaging: 01/30/21   +-----------+--------+-----+--------+----------+----------+   RIGHT       PSV cm/s Ratio Stenosis Waveform   Comments     +-----------+--------+-----+--------+----------+----------+   CFA Distal  65                      biphasic                +-----------+--------+-----+--------+----------+----------+   DFA         42                      biphasic                +-----------+--------+-----+--------+----------+----------+   SFA Prox    48                      biphasic                +-----------+--------+-----+--------+----------+----------+   SFA Mid     71                      triphasic               +-----------+--------+-----+--------+----------+----------+   SFA Distal  63                      triphasic               +-----------+--------+-----+--------+----------+----------+   POP Prox    82                      triphasic               +-----------+--------+-----+--------+----------+----------+   POP Distal  29                      triphasic               +-----------+--------+-----+--------+----------+----------+   ATA Distal  30                      monophasic              +-----------+--------+-----+--------+----------+----------+   PTA Distal  19                      monophasic retrograde   +-----------+--------+-----+--------+----------+----------+   PERO Distal 11  monophasic              +-----------+--------+-----+--------+----------+----------+    Left Graft #1: fem-AK popliteal  +--------------------+--------+--------+---------+--------+                        PSV cm/s Stenosis Waveform  Comments   +--------------------+--------+--------+---------+--------+   Inflow               72                triphasic             +--------------------+--------+--------+---------+--------+   Proximal Anastomosis 73                biphasic             +--------------------+--------+--------+---------+--------+   Proximal Graft       43                triphasic            +--------------------+--------+--------+---------+--------+   Mid Graft            44                triphasic            +--------------------+--------+--------+---------+--------+   Distal Graft         43                triphasic            +--------------------+--------+--------+---------+--------+   Distal Anastomosis   40                biphasic             +--------------------+--------+--------+---------+--------+   Outflow              38                triphasic            +--------------------+--------+--------+---------+--------+   Summary:  Right: Patent with no stenosis visualized. The PTA appears occluded with distal retrograde recanalization.   Left: Patent fem-AK popliteal bypass graft with no stenosis seen.   +-------+-----------+-----------+------------+------------+   ABI/TBI Today's ABI Today's TBI Previous ABI Previous TBI   +-------+-----------+-----------+------------+------------+   Right   [place]          0.42        [place]           0.29           +-------+-----------+-----------+------------+------------+   Left    [place]          0.43        [place]           0.37           +-------+-----------+-----------+------------+------------+    ASSESSMENT/PLAN:: 71 y.o. male here for follow up for PAD. He currently is asymptomatic. He has no claudication, rest pain or tissue loss. His toe ulceration remains healed. His ABIs remain non compressible and TBI bilaterally are slightly improved. His duplex today shows Left fem - AK popliteal bypass is patent and his RLE is patent throughout with monophasic flow in the tibials.  - encouraged him to continue his workout routine - Continue Aspirin, statin, Plavix and pletal - he will return in 6  months with ABI. He knows to follow up sooner should he have any new or concerning symptoms   Karoline Caldwell, PA-C [facility] [phone]  Clinic MD:   Roxanne Mins

## 2021-02-01 DIAGNOSIS — H6123 Impacted cerumen, bilateral: Secondary | ICD-10-CM | POA: Diagnosis not present

## 2021-02-01 DIAGNOSIS — H903 Sensorineural hearing loss, bilateral: Secondary | ICD-10-CM | POA: Diagnosis not present

## 2021-02-04 DIAGNOSIS — I10 Essential (primary) hypertension: Secondary | ICD-10-CM | POA: Diagnosis not present

## 2021-02-04 DIAGNOSIS — E1165 Type 2 diabetes mellitus with hyperglycemia: Secondary | ICD-10-CM | POA: Diagnosis not present

## 2021-02-06 ENCOUNTER — Other Ambulatory Visit: Payer: Self-pay | Admitting: *Deleted

## 2021-02-06 DIAGNOSIS — I10 Essential (primary) hypertension: Secondary | ICD-10-CM | POA: Diagnosis not present

## 2021-02-06 DIAGNOSIS — I739 Peripheral vascular disease, unspecified: Secondary | ICD-10-CM

## 2021-02-06 DIAGNOSIS — Z95828 Presence of other vascular implants and grafts: Secondary | ICD-10-CM

## 2021-02-06 DIAGNOSIS — E782 Mixed hyperlipidemia: Secondary | ICD-10-CM | POA: Diagnosis not present

## 2021-02-07 ENCOUNTER — Telehealth: Payer: Self-pay

## 2021-02-07 DIAGNOSIS — E782 Mixed hyperlipidemia: Secondary | ICD-10-CM

## 2021-02-07 MED ORDER — ATORVASTATIN CALCIUM 80 MG PO TABS: 80.0000 mg | ORAL_TABLET | Freq: Every day | ORAL | 3 refills | Status: DC

## 2021-02-07 NOTE — Telephone Encounter (Signed)
Patient notified of lab results and recommendations from APP. Pt verbalized understanding and agreeable to medication changes and lab work request. Pt had no other concerns or questions at this time.

## 2021-02-07 NOTE — Telephone Encounter (Signed)
-----   Message from Eli Phillips sent at 01/31/2021  9:27 AM EST -----  ----- Message ----- From: Antony Madura, PA-C Sent: 01/26/2021   6:45 AM EST To: Eli Phillips  LDL is above goal. Lets increase Lipitor to 80mg  daily and re-check LFTs and lipid panel in 8 weeks. (May have follow-up then) This is a [facility] pt but unsure how to send to [facility] triage

## 2021-02-13 ENCOUNTER — Other Ambulatory Visit: Payer: Self-pay

## 2021-02-13 ENCOUNTER — Ambulatory Visit (INDEPENDENT_AMBULATORY_CARE_PROVIDER_SITE_OTHER): Payer: Medicare Other | Admitting: Podiatry

## 2021-02-13 ENCOUNTER — Encounter: Payer: Self-pay | Admitting: Podiatry

## 2021-02-13 DIAGNOSIS — M79675 Pain in left toe(s): Secondary | ICD-10-CM

## 2021-02-13 DIAGNOSIS — M2012 Hallux valgus (acquired), left foot: Secondary | ICD-10-CM | POA: Diagnosis not present

## 2021-02-13 DIAGNOSIS — E119 Type 2 diabetes mellitus without complications: Secondary | ICD-10-CM | POA: Diagnosis not present

## 2021-02-13 DIAGNOSIS — M2042 Other hammer toe(s) (acquired), left foot: Secondary | ICD-10-CM

## 2021-02-13 DIAGNOSIS — M2011 Hallux valgus (acquired), right foot: Secondary | ICD-10-CM

## 2021-02-13 DIAGNOSIS — B351 Tinea unguium: Secondary | ICD-10-CM

## 2021-02-13 DIAGNOSIS — M2041 Other hammer toe(s) (acquired), right foot: Secondary | ICD-10-CM | POA: Diagnosis not present

## 2021-02-13 DIAGNOSIS — E1159 Type 2 diabetes mellitus with other circulatory complications: Secondary | ICD-10-CM | POA: Diagnosis not present

## 2021-02-13 DIAGNOSIS — L84 Corns and callosities: Secondary | ICD-10-CM

## 2021-02-13 DIAGNOSIS — M79674 Pain in right toe(s): Secondary | ICD-10-CM | POA: Diagnosis not present

## 2021-02-13 NOTE — Progress Notes (Signed)
ANNUAL DIABETIC FOOT EXAM  Subjective: [name] presents today for annual diabetic foot examination, at risk foot care. Pt has h/o NIDDM with PAD, and painful elongated mycotic toenails 1-5 bilaterally which are tender when wearing enclosed shoe gear. Pain is relieved with periodic professional debridement..  Patient relates 20 year h/o diabetes.  Patient denies any h/o foot wounds.  Patient denies any numbness, tingling, burning, or pins/needle sensation in feet.  Patient's blood sugar was 137 mg/dl today.   Risk factors: PAD, RA, immunosuppressive medications, dyslipidemia, HTN, CAD.  [name], MD is patient's PCP. Last visit was October 24, 2020.  Patient states he went and got a pedicure with his wife.  Past Medical History:  Diagnosis Date   Bronchitis    Carotid artery disease ([place])    Cervical spine disease    Neck surgery in 2007 with Dr. Joya Salm   COPD (chronic obstructive pulmonary disease) ([place])    Coronary atherosclerosis of native coronary artery    Nonobstrtuctive by catheterization 2007, 2013, 2021   DVT (deep venous thrombosis) ([place])    Essential hypertension    GERD (gastroesophageal reflux disease)    Hyperlipidemia    PAD (peripheral artery disease) ([place])    s/p left fem-pop bypass 2007; PTA and stenting of right iliac artery 09/2011   Rheumatoid arthritis(714.0)    Type 2 diabetes mellitus ([place])    Patient Active Problem List   Diagnosis Date Noted   Peripheral polyneuropathy 08/15/2020   Ulnar neuropathy of right upper extremity 08/15/2020   Foraminal stenosis of cervical region 07/20/2020   Tremor 02/15/2019   Cervical radiculitis 10/15/2018   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   Opioid dependence ([place]) 10/15/2018   Other long term (current) drug therapy 10/15/2018   Puncture wound of finger of left hand 08/08/2018   Puncture wound of finger, right, complicated, initial encounter 08/05/2018   DOE (dyspnea on exertion) 11/04/2017    Muscle cramps 07/04/2017   Contracture of left elbow 07/04/2017   High risk medication use 06/23/2017   Primary osteoarthritis of both hands 06/23/2017   Primary osteoarthritis of both feet 06/23/2017   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 factor ([place]) 06/23/2017   DDD (degenerative disc disease), cervical 06/23/2017   Rotator cuff syndrome of right shoulder 06/06/2017   History of  bilateral carpal tunnel release 06/06/2017   Ganglion cyst of wrist, left 06/06/2017   Non-insulin dependent type 2 diabetes mellitus ([place]) 06/06/2017   Chest pain with high risk for cardiac etiology 04/05/2017   COPD with acute exacerbation ([place]) 03/13/2017   Plantar fasciitis 02/21/2017   Iliac artery stenosis, right ([place]) 02/12/2017   Hematochezia 02/10/2017   Abnormal CT of the abdomen 02/10/2017   Cervical spondylosis 10/11/2016   Bony growth 10/13/2015   Pain in joint, lower leg 03/02/2014   Discoloration of skin-Left dorsum foot 09/28/2013   Swelling of limb-Left Calf / Leg 09/28/2013   PVD (peripheral vascular disease) ([place]) 10/14/2012   Pain in limb- Left popliteal and calf 10/14/2012   Dyslipidemia, goal LDL below 70    Essential hypertension 03/05/2012   Secondary cardiomyopathy, 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/12/2011   Generalized osteoarthritis of multiple sites 02/06/2011   History of arterial bypass of lower limb 12/25/2010   Atherosclerosis of native arteries of extremity with intermittent claudication ([place]) 12/25/2010   Coronary artery calcification seen on CT scan 04/16/2008   COPD  GOLD II 04/16/2008   GASTROESOPHAGEAL REFLUX DISEASE 04/16/2008   Rheumatoid arthritis ([place]) 04/16/2008  DIABETES MELLITUS, BORDERLINE 04/16/2008   Past Surgical History:  Procedure Laterality Date   ABDOMINAL AORTAGRAM N/A 10/03/2011   Procedure: ABDOMINAL AORTAGRAM;  Surgeon: Conrad Metlakatla, MD;  Location: [facility] CATH LAB;  Service: Cardiovascular;  Laterality: N/A;   ABDOMINAL AORTOGRAM W/LOWER  EXTREMITY N/A 01/16/2017   Procedure: ABDOMINAL AORTOGRAM W/LOWER EXTREMITY;  Surgeon: Conrad Eagle, MD;  Location: [facility];  Service: Cardiovascular;  Laterality: N/A;   ABDOMINAL AORTOGRAM W/LOWER EXTREMITY Bilateral 08/04/2019   Procedure: ABDOMINAL AORTOGRAM W/LOWER EXTREMITY;  Surgeon: Marty Heck, MD;  Location: [facility];  Service: Cardiovascular;  Laterality: Bilateral;   ABDOMINAL AORTOGRAM W/LOWER EXTREMITY N/A 03/09/2020   Procedure: ABDOMINAL AORTOGRAM W/LOWER EXTREMITY;  Surgeon: Marty Heck, MD;  Location: [facility];  Service: Cardiovascular;  Laterality: N/A;   CERVICAL DISC SURGERY     COLONOSCOPY WITH PROPOFOL N/A 02/17/2017   Procedure: COLONOSCOPY WITH PROPOFOL;  Surgeon: Daneil Dolin, MD;  Location[facility];  Service: Endoscopy;  Laterality: N/A;  2:45pm   FEMORAL-POPLITEAL BYPASS GRAFT  11/06/2005   ILIAC ARTERY STENT  10-03-11   Right CIA stenting   LEFT HEART CATH AND CORONARY ANGIOGRAPHY N/A 04/14/2019   Procedure: LEFT HEART CATH AND CORONARY ANGIOGRAPHY;  Surgeon: Troy Sine, MD;  Location: [facility];  Service: Cardiovascular;  Laterality: N/A;   PERIPHERAL VASCULAR BALLOON ANGIOPLASTY Right 01/16/2017   Procedure: PERIPHERAL VASCULAR BALLOON ANGIOPLASTY;  Surgeon: Conrad Amidon, MD;  Location: [facility];  Service: Cardiovascular;  Laterality: Right;  common iliac    PERIPHERAL VASCULAR BALLOON ANGIOPLASTY  03/09/2020   Procedure: PERIPHERAL VASCULAR BALLOON ANGIOPLASTY;  Surgeon: Marty Heck, MD;  Location: [facility];  Service: Cardiovascular;;   PR VEIN BYPASS GRAFT,AORTO-FEM-POP  06-22-2010   Redo Left Fem-Pop   Repair of left arm fracture     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      atorvastatin (LIPITOR) 80 MG tablet Take 1 tablet (80 mg total) by mouth daily. 90 tablet 3   budesonide-formoterol (SYMBICORT) 160-4.5 MCG/ACT inhaler Take 2 puffs first thing in am and then another 2 puffs about 12 hours later. 10.2 g 11   cilostazol (PLETAL) 100 MG tablet TAKE 1 TABLET(100 MG) BY MOUTH TWICE DAILY 60 tablet 5   clopidogrel (PLAVIX) 75 MG tablet Take 1 tablet (75 mg total) by mouth daily. 30 tablet 11   Dexlansoprazole (DEXILANT) 30 MG capsule Take 30- 60 min before your first and last meals of the day (Patient taking differently: Take 30 mg by mouth daily.) 60 capsule 11   doxycycline (VIBRAMYCIN) 100 MG capsule Take 100 mg by mouth 2 (two) times daily.     HYDROcodone-acetaminophen (NORCO) 7.5-325 MG per tablet Take 1 tablet by mouth 2 (two) times daily as needed for moderate pain.     isosorbide dinitrate (ISORDIL) 30 MG tablet Take 30 mg by mouth daily.     JANUVIA 100 MG tablet Take 100 mg by mouth daily.     JARDIANCE 25 MG TABS tablet Take 25 mg by mouth daily.     losartan (COZAAR) 25 MG tablet Take 25 mg by mouth daily.     meclizine (ANTIVERT) 12.5 MG tablet Take 12.5  mg by mouth 3 (three) times daily.     metFORMIN (GLUCOPHAGE) 1000 MG tablet Take 1,000 mg by mouth 2 (two) times daily with a meal.     methylcellulose oral powder Take by mouth daily. (Patient not taking: Reported on 01/30/2021)     MODERNA COVID-19 VACCINE 100 MCG/0.5ML injection      nitroGLYCERIN (NITROSTAT) 0.4 MG SL tablet Place 1 tablet (0.4 mg total) under the tongue every 5 (five) minutes as needed for chest pain. 25 tablet 3   ONE TOUCH ULTRA TEST test strip      OneTouch Delica Lancets 49Q MISC Apply topically.     PROAIR HFA 108 (90 BASE) MCG/ACT inhaler Inhale 2 puffs into the lungs every 6 (six) hours as needed for wheezing or shortness of breath.      sildenafil (REVATIO) 20 MG tablet Take 40-60 mg by mouth daily as needed for erectile dysfunction.     Tiotropium Bromide Monohydrate  (SPIRIVA RESPIMAT) 2.5 MCG/ACT AERS Inhale 2 puffs into the lungs daily. 4 g 11   XELJANZ 5 MG TABS Take 1 tablet (5 mg total) by mouth daily. 90 tablet 0   No current facility-administered medications on file prior to visit.    No Known Allergies Social History   Occupational History   Not on file  Tobacco Use   Smoking status: Former    Packs/day: 1.00    Years: 45.00    Pack years: 45.00    Types: Cigarettes, Cigars    Quit date: 09/01/2011    Years since quitting: 9.4   Smokeless tobacco: Never  Vaping Use   Vaping Use: Never used  Substance and Sexual Activity   Alcohol use: No    Alcohol/week: 0.0 standard drinks   Drug use: No   Sexual activity: Not Currently    Birth control/protection: None   Family History  Problem Relation Age of Onset   Heart attack Mother 41   Deep vein thrombosis Mother    Diabetes Mother    Hyperlipidemia Mother    Hypertension Mother    Peripheral vascular disease Mother    Stroke Father    Diabetes Father    Hyperlipidemia Father    Hypertension Father    Peripheral vascular disease Father    Diabetes Sister    Hyperlipidemia Sister    Hypertension Sister    Diabetes Brother    Hyperlipidemia Brother    Cancer Brother    Deep vein thrombosis Brother    Hypertension Brother    Peripheral vascular disease Brother    Hyperlipidemia Son    Cancer Son    Hypertension Son    Diabetes Daughter    Colon cancer Neg Hx    Immunization History  Administered Date(s) Administered   Influenza, High Dose Seasonal PF 10/14/2016, 09/21/2018   Influenza-Unspecified 11/08/2008, 12/10/2019   Moderna Sars-Covid-2 Vaccination 02/19/2019, 03/22/2019, 11/23/2019   PPD Test 09/22/2007   Tdap 02/09/2001, 08/05/2018     Review of Systems: Negative except as noted in the HPI.   Objective: There were no vitals filed for this visit.  Kerby Borner is a pleasant 71 y.o. male in NAD. AAO X 3.  Vascular Examination: CFT delayed b/l LE. Faintly  palpable DP pulse(s) RLE. Diminished PT pulse(s) b/l LE. Diminished DP pulse(s) left lower extremity. No pain with calf compression b/l. Lower extremity skin temperature gradient warm to cool. No edema noted b/l LE. No ischemia or gangrene noted b/l LE. No cyanosis or clubbing noted b/l  LE.  Dermatological Examination: Pedal skin thin, shiny and atrophic b/l LE. No open wounds b/l LE. No interdigital macerations noted b/l LE. Toenails 1-5 bilaterally elongated, discolored, dystrophic, thickened, and crumbly with subungual debris and tenderness to dorsal palpation. Hyperkeratotic lesion(s) L hallux and submet head 3 right foot.  No erythema, no edema, no drainage, no fluctuance.  Musculoskeletal Examination: Muscle strength 5/5 to all lower extremity muscle groups bilaterally. HAV with bunion deformity noted b/l LE. Hammertoe deformity noted 2-5 b/l.  Footwear Assessment: Does the patient wear appropriate shoes? Yes. Does the patient need inserts/orthotics? Yes.  Neurological Examination: Pt has subjective symptoms of neuropathy. Protective sensation intact 5/5 intact bilaterally with 10g monofilament b/l. Vibratory sensation intact b/l.  Assessment: 1. Pain due to onychomycosis of toenails of both feet   2. Callus   3. Hallux valgus, acquired, bilateral   4. Acquired hammertoes of both feet   5. Type 2 diabetes mellitus with vascular disease ([place])   6. Encounter for diabetic foot exam ([place])     ADA Risk Categorization: High Risk  Patient has one or more of the following: Loss of protective sensation Absent pedal pulses Severe Foot deformity History of foot ulcer  Plan: -Counseled patient on the dangers of pedicures given his dx of diabetes and PAD. He related understanding and endorsed compliance going forward. -Diabetic foot examination performed today. -Continue foot and shoe inspections daily. Monitor blood glucose per PCP/Endocrinologist's recommendations. -Mycotic toenails 1-5  bilaterally were debrided in length and girth with sterile nail nippers and dremel without incident. -Callus(es) L hallux and submet head 3 right foot pared utilizing sterile scalpel blade without complication or incident. Total number debrided =2. -Patient/POA to call should there be question/concern in the interim.  Return in about 9 weeks (around 04/17/2021).  Marzetta Board, DPM

## 2021-02-16 DIAGNOSIS — H905 Unspecified sensorineural hearing loss: Secondary | ICD-10-CM | POA: Diagnosis not present

## 2021-02-19 DIAGNOSIS — M069 Rheumatoid arthritis, unspecified: Secondary | ICD-10-CM | POA: Diagnosis not present

## 2021-02-19 DIAGNOSIS — J449 Chronic obstructive pulmonary disease, unspecified: Secondary | ICD-10-CM | POA: Diagnosis not present

## 2021-02-19 DIAGNOSIS — I10 Essential (primary) hypertension: Secondary | ICD-10-CM | POA: Diagnosis not present

## 2021-02-19 DIAGNOSIS — I25119 Atherosclerotic heart disease of native coronary artery with unspecified angina pectoris: Secondary | ICD-10-CM | POA: Diagnosis not present

## 2021-02-19 DIAGNOSIS — E1165 Type 2 diabetes mellitus with hyperglycemia: Secondary | ICD-10-CM | POA: Diagnosis not present

## 2021-02-19 DIAGNOSIS — Z6823 Body mass index (BMI) 23.0-23.9, adult: Secondary | ICD-10-CM | POA: Diagnosis not present

## 2021-02-19 DIAGNOSIS — Z87891 Personal history of nicotine dependence: Secondary | ICD-10-CM | POA: Diagnosis not present

## 2021-02-19 DIAGNOSIS — Z299 Encounter for prophylactic measures, unspecified: Secondary | ICD-10-CM | POA: Diagnosis not present

## 2021-02-19 NOTE — Progress Notes (Signed)
Office Visit Note  Patient: [name]             Date of Birth: [birth year]           MRN: [id number]             PCP: [name], MD Referring: [name], MD Visit Date: 03/05/2021 Occupation: @GUAROCC @  Subjective:  Pain in multiple joints  History of Present Illness: [name] is a 71 y.o. male with history of with history of seronegative rheumatoid arthritis, osteoarthritis, DDD.  Patient is taking Xeljanz 5 mg 1 tablet by mouth daily.  He reports that he misses a few doses per week.  He continues to tolerate Morrie Sheldon without any side effects.  He presents today with pain in the left shoulder joint.  He states that he had a bad several months ago landed on his stool and injured his left shoulder.  He was evaluated by his PCP on 10/24/2020 at which time he had x-rays of the C-spine and left shoulder.  He has had persistent discomfort since then.  He has been taking hydrocodone for pain relief.  He has not been evaluated by orthopedics yet.  He continues to have chronic pain and stiffness in the left wrist joint.  He wears a compression brace as needed.  He was previously evaluated by Dr. Erlinda Hong.  He does not want to proceed with surgery at this time.    Activities of Daily Living:  Patient reports morning stiffness for 5 minutes.   Patient Reports nocturnal pain.  Difficulty dressing/grooming: Reports Difficulty climbing stairs: Reports Difficulty getting out of chair: Reports Difficulty using hands for taps, buttons, cutlery, and/or writing: Reports  Review of Systems  Constitutional:  Positive for fatigue.  HENT:  Negative for mouth sores, mouth dryness and nose dryness.   Eyes:  Negative for pain, itching and dryness.  Respiratory:  Positive for shortness of breath. Negative for difficulty breathing.   Cardiovascular:  Negative for chest pain and palpitations.  Gastrointestinal:  Negative for blood in stool, constipation and diarrhea.  Endocrine: Negative for  increased urination.  Genitourinary:  Negative for difficulty urinating.  Musculoskeletal:  Positive for joint pain, joint pain, joint swelling, myalgias, morning stiffness, muscle tenderness and myalgias.  Skin:  Negative for color change, rash and redness.  Allergic/Immunologic: Negative for susceptible to infections.  Neurological:  Positive for headaches. Negative for dizziness, numbness, memory loss and weakness.  Hematological:  Negative for bruising/bleeding tendency.  Psychiatric/Behavioral:  Negative for confusion.    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) 10/15/2018   Other long term (current) drug therapy 10/15/2018   Puncture wound of finger of left hand 08/08/2018   Puncture wound of finger, right, complicated, initial encounter 08/05/2018   DOE (dyspnea on exertion) 11/04/2017   Muscle cramps 07/04/2017   Contracture of left elbow 07/04/2017   High risk medication use 06/23/2017   Primary osteoarthritis of both hands 06/23/2017   Primary osteoarthritis of both feet 06/23/2017   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 factor ([place]) 06/23/2017   DDD (degenerative disc disease), cervical 06/23/2017   Rotator cuff syndrome of right shoulder 06/06/2017   History of  bilateral carpal  tunnel release 06/06/2017   Ganglion cyst of wrist, left 06/06/2017   Non-insulin dependent type 2 diabetes mellitus ([place]) 06/06/2017   Chest pain with high risk for cardiac etiology 04/05/2017   COPD with acute exacerbation ([place]) 03/13/2017   Plantar fasciitis 02/21/2017   Iliac artery stenosis, right ([place]) 02/12/2017   Hematochezia 02/10/2017   Abnormal CT of the abdomen  02/10/2017   Cervical spondylosis 10/11/2016   Bony growth 10/13/2015   Pain in joint, lower leg 03/02/2014   Discoloration of skin-Left dorsum foot 09/28/2013   Swelling of limb-Left Calf / Leg 09/28/2013   PVD (peripheral vascular disease) ([place]) 10/14/2012   Pain in limb- Left popliteal and calf 10/14/2012   Dyslipidemia, goal LDL below 70    Essential hypertension 03/05/2012   Secondary cardiomyopathy, 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/12/2011   Generalized osteoarthritis of multiple sites 02/06/2011   History of arterial bypass of lower limb 12/25/2010   Atherosclerosis of native arteries of extremity with intermittent claudication ([place]) 12/25/2010   Coronary artery calcification seen on CT scan 04/16/2008   COPD  GOLD II 04/16/2008   GASTROESOPHAGEAL REFLUX DISEASE 04/16/2008   Rheumatoid arthritis ([place]) 04/16/2008   DIABETES MELLITUS, BORDERLINE 04/16/2008    Past Medical History:  Diagnosis Date   Bronchitis    Carotid artery disease ([place])    Cervical spine disease    Neck surgery in 2007 with Dr. Joya Salm   COPD (chronic obstructive pulmonary disease) ([place])    Coronary atherosclerosis of native coronary artery    Nonobstrtuctive by catheterization 2007, 2013, 2021   DVT (deep venous thrombosis) ([place])    Essential hypertension    GERD (gastroesophageal reflux disease)    Hyperlipidemia    PAD (peripheral artery disease) ([place])    s/p left fem-pop bypass 2007; PTA and stenting of right iliac artery 09/2011   Rheumatoid arthritis(714.0)    Type 2 diabetes mellitus ([place])     Family History  Problem Relation Age of Onset   Heart attack Mother 3   Deep vein thrombosis Mother    Diabetes Mother    Hyperlipidemia Mother    Hypertension Mother    Peripheral vascular disease Mother    Stroke Father    Diabetes Father    Hyperlipidemia Father    Hypertension Father    Peripheral vascular disease Father    Diabetes Sister    Hyperlipidemia Sister    Hypertension Sister    Diabetes Brother     Hyperlipidemia Brother    Cancer Brother    Deep vein thrombosis Brother    Hypertension Brother    Peripheral vascular disease Brother    Hyperlipidemia Son    Cancer Son    Hypertension Son    Diabetes Daughter    Colon cancer Neg Hx    Past Surgical History:  Procedure Laterality Date   ABDOMINAL AORTAGRAM N/A 10/03/2011   Procedure: ABDOMINAL Maxcine Ham;  Surgeon: Conrad [place]-Fisher, MD;  Location: [facility] CATH LAB;  Service: Cardiovascular;  Laterality: N/A;   ABDOMINAL AORTOGRAM W/LOWER EXTREMITY N/A 01/16/2017   Procedure: ABDOMINAL AORTOGRAM W/LOWER EXTREMITY;  Surgeon: Conrad Ramah, MD;  Location: [place] CV LAB;  Service: Cardiovascular;  Laterality: N/A;   ABDOMINAL AORTOGRAM W/LOWER EXTREMITY Bilateral 08/04/2019   Procedure: ABDOMINAL AORTOGRAM W/LOWER EXTREMITY;  Surgeon: Marty Heck, MD;  Location: [place] [facility] LAB;  Service: Cardiovascular;  Laterality: Bilateral;   ABDOMINAL AORTOGRAM W/LOWER EXTREMITY N/A 03/09/2020   Procedure: ABDOMINAL AORTOGRAM W/LOWER EXTREMITY;  Surgeon: Marty Heck, MD;  Location: [place] CV LAB;  Service: Cardiovascular;  Laterality: N/A;   CERVICAL DISC SURGERY     COLONOSCOPY WITH PROPOFOL N/A 02/17/2017   Procedure: COLONOSCOPY WITH PROPOFOL;  Surgeon: Daneil Dolin, MD;  [facility];  Service: Endoscopy;  Laterality: N/A;  2:45pm   FEMORAL-POPLITEAL BYPASS GRAFT  11/06/2005   ILIAC ARTERY STENT  10-03-11   Right CIA stenting   LEFT HEART CATH AND CORONARY ANGIOGRAPHY N/A 04/14/2019   Procedure: LEFT HEART CATH AND CORONARY ANGIOGRAPHY;  Surgeon: Troy Sine, MD;  Location: [place] CV LAB;  Service: Cardiovascular;  Laterality: N/A;   PERIPHERAL VASCULAR BALLOON ANGIOPLASTY Right 01/16/2017   Procedure: PERIPHERAL VASCULAR BALLOON ANGIOPLASTY;  Surgeon: Conrad Laton, MD;  Location: [place] CV LAB;  Service: Cardiovascular;  Laterality: Right;  common iliac    PERIPHERAL VASCULAR BALLOON ANGIOPLASTY   03/09/2020   Procedure: PERIPHERAL VASCULAR BALLOON ANGIOPLASTY;  Surgeon: Marty Heck, MD;  Location: [place] [facility] LAB;  Service: Cardiovascular;;   PR VEIN BYPASS GRAFT,AORTO-FEM-POP  06-22-2010   Redo Left Fem-Pop   Repair of left arm fracture     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    Immunization History  Administered Date(s) Administered   Influenza, High Dose Seasonal PF 10/14/2016, 09/21/2018   Influenza-Unspecified 11/08/2008, 12/10/2019   Moderna Sars-Covid-2 Vaccination 02/19/2019, 03/22/2019, 11/23/2019   PPD Test 09/22/2007   Tdap 02/09/2001, 08/05/2018     Objective: Vital Signs: BP 125/76 (BP Location: Left Arm, Patient Position: Sitting, Cuff Size: Normal)    Pulse (!) 106    Ht 5\' 9"  (1.753 m)    Wt 166 lb 3.2 oz (75.4 kg)    BMI 24.54 kg/m    Physical Exam Vitals and nursing note reviewed.  Constitutional:      Appearance: He is well-developed.  HENT:     Head: Normocephalic and atraumatic.  Eyes:     Conjunctiva/sclera: Conjunctivae normal.     Pupils: Pupils are equal, round, and reactive to light.  Pulmonary:     Effort: Pulmonary effort is normal.  Abdominal:     Palpations: Abdomen is soft.  Musculoskeletal:     Cervical back: Normal range of motion and neck supple.  Skin:    General: Skin is warm and dry.     Capillary Refill: Capillary refill takes less than 2 seconds.  Neurological:     Mental Status: He is alert and oriented to person, place, and time.  Psychiatric:        Behavior: Behavior normal.     Musculoskeletal Exam: C-spine has painful range of motion.  Left shoulder has painful range of motion especially with internal rotation.  Bilateral elbow joint contractures.  Limited range of motion of both wrist joints especially the left wrist.  Synovial thickening over both wrist and MCP joints.  No tenderness or synovitis of MCP  joints.  No major extension of PIP joints.  Partially rotation of the right fifth finger.  Hip joints have good range of motion with no groin pain.  Knee joints have good range of motion with no warmth or effusion.  Ankle joints have good range of motion with some tenderness of the left ankle.  CDAI Exam: CDAI Score: 2.6  Patient Global: 3 mm; Provider Global: 3 mm Swollen: 0 ; Tender: 4  Joint Exam 03/05/2021  Right  Left  Acromioclavicular      Tender  Glenohumeral      Tender  Wrist      Tender  Ankle      Tender     Investigation: No additional findings.  Imaging: No results found.  Recent Labs: Lab Results  Component Value Date   WBC 5.6 12/05/2020   HGB 12.6 (L) 12/05/2020   PLT 324 12/05/2020   NA 142 12/05/2020   K 4.5 12/05/2020   CL 106 12/05/2020   CO2 28 12/05/2020   GLUCOSE 260 (H) 12/05/2020   BUN 12 12/05/2020   CREATININE 0.88 12/05/2020   BILITOT 0.5 01/25/2021   ALKPHOS 79 01/25/2021   AST 20 01/25/2021   ALT 16 01/25/2021   PROT 8.3 (H) 01/25/2021   ALBUMIN 4.5 01/25/2021   CALCIUM 9.1 12/05/2020   GFRAA 83 01/27/2020   QFTBGOLDPLUS NEGATIVE 12/05/2020    Speciality Comments: Prior therapy includes: methotrexate ( d/c alcohol use), Arava (d/c patient preference), Orenica (patient declined)  Procedures:  No procedures performed Allergies: Patient has no known allergies.   Assessment / Plan:     Visit Diagnoses: 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 factor ([place]): He has no synovitis on examination today.  He has not had any signs or symptoms of a rheumatoid arthritis flare.  Overall his rheumatoid arthritis is currently well controlled on Xeljanz 5 mg 1 tablet by mouth daily.  Discussed the importance of remaining compliant with Morrie Sheldon on a daily basis.  He occasionally misses several doses of Xeljanz per week due to forgetting to take his medications.  No medication changes will be made at this time.  Of note he  injured his left shoulder in October 2022 and had x-rays of the C-spine and left shoulder on 10/24/2020.  He continues to have persistent discomfort in the left shoulder and brought pictures of bruising of the left shoulder after the fall.  Referral to orthopedics was placed today and he will be evaluated tomorrow in [place]. He will follow up in 5 months for a return appointment unless he needs to be evaluated sooner.  High risk medication use - Xeljanz 5 mg 1 tablet by mouth daily.  Methotrexate discontinued due to alcohol use, Orencia declined by patient, treated with Morrie Sheldon initially at [facility].  CBC and CMP updated today to monitor for drug toxicity.  TB Gold negative on 12/05/2020 and will continue to be monitored yearly.  Lipid panel-WNL on 01/25/2021.  - Plan: CBC with Differential/Platelet, COMPLETE METABOLIC PANEL WITH GFR He should hold xeljanz if he develops signs or symptoms of an infection and resume once the infection has completely cleared.   Rotator cuff syndrome of right shoulder: Chronic pain.  He has good ROM of the right shoulder on examination today.   Chronic left shoulder pain - He presents today with increased discomfort in the left shoulder joint.  Several months ago he fell out of bed and landed on a stool and injured his left shoulder.  He was evaluated by his PCP and had updated x-rays of the C-spine and left shoulder joint on 10/24/2020.  X-rays of the left shoulder revealed no acute osseous abnormality.  AC joint degenerative changes were noted.  He has not gone to physical therapy.  He is not a good candidate for a cortisone injection due to uncontrolled diabetes.  He has had persistent discomfort in the left shoulder.  He has noticed painful range of motion and discomfort at  night.  He has been taking hydrocodone as needed for pain relief and uses ice and heat as needed.  An urgent referral to orthopedics was placed today and he will be evaluated tomorrow in  [place].   plan: AMB referral to orthopedics  Contracture of joint of both elbows - Unchanged.  He had previous injury to his left elbow joint.  Primary osteoarthritis of both hands - He has severe osteoarthritis in both hands.  PIP and DIP thickening noted.  No tenderness or inflammation was noted on examination today.  History of  bilateral carpal tunnel release  Chronic pain of left knee - X-ray showed mild osteoarthritis and moderate chondromalacia patella.  Good range of motion of the left knee joint with no warmth or effusion noted.  Primary osteoarthritis of both feet - X-rays are consistent with rheumatoid arthritis and osteoarthritis.  No radiographic progression was noted on May 05, 2020 when compared to the x-rays of 2019.  He has some tenderness ovation over the left ankle joint but no synovitis was noted.  DDD (degenerative disc disease), cervical: Chronic pain and stiffness.  X-rays of the C-spine were updated on 10/24/2020 which revealed straightening of the cervical spine with post fusion changes at C5-C7.  Moderate degenerative changes at C3-C4.   Other medical conditions are listed as follows:   Mixed hyperlipidemia  Essential hypertension: BP was 125/76 today in the office.   History of diabetes mellitus  COPD  GOLD II  Atherosclerosis of native coronary artery of native heart without angina pectoris  PVD (peripheral vascular disease) ([place])   Orders: Orders Placed This Encounter  Procedures   CBC with Differential/Platelet   COMPLETE METABOLIC PANEL WITH GFR   AMB referral to orthopedics   No orders of the defined types were placed in this encounter.   Follow-Up Instructions: Return in about 5 months (around 08/02/2021) for Rheumatoid arthritis, Osteoarthritis, DDD.   Ofilia Neas, PA-C  Note - This record has been created using Dragon software.  Chart creation errors have been sought, but may not always  have been located. Such creation errors do  not reflect on  the standard of medical care.

## 2021-02-20 ENCOUNTER — Ambulatory Visit: Payer: Medicare Other | Admitting: Cardiology

## 2021-02-26 ENCOUNTER — Other Ambulatory Visit: Payer: Self-pay | Admitting: Cardiology

## 2021-03-05 ENCOUNTER — Other Ambulatory Visit: Payer: Self-pay

## 2021-03-05 ENCOUNTER — Encounter: Payer: Self-pay | Admitting: Physician Assistant

## 2021-03-05 ENCOUNTER — Ambulatory Visit (INDEPENDENT_AMBULATORY_CARE_PROVIDER_SITE_OTHER): Payer: Medicare Other | Admitting: Physician Assistant

## 2021-03-05 VITALS — BP 125/76 | HR 106 | Ht 69.0 in | Wt 166.2 lb

## 2021-03-05 DIAGNOSIS — M19041 Primary osteoarthritis, right hand: Secondary | ICD-10-CM

## 2021-03-05 DIAGNOSIS — I10 Essential (primary) hypertension: Secondary | ICD-10-CM | POA: Diagnosis not present

## 2021-03-05 DIAGNOSIS — M5030 Other cervical disc degeneration, unspecified cervical region: Secondary | ICD-10-CM | POA: Diagnosis not present

## 2021-03-05 DIAGNOSIS — M25562 Pain in left knee: Secondary | ICD-10-CM

## 2021-03-05 DIAGNOSIS — M75101 Unspecified rotator cuff tear or rupture of right shoulder, not specified as traumatic: Secondary | ICD-10-CM

## 2021-03-05 DIAGNOSIS — Z8639 Personal history of other endocrine, nutritional and metabolic disease: Secondary | ICD-10-CM

## 2021-03-05 DIAGNOSIS — M19071 Primary osteoarthritis, right ankle and foot: Secondary | ICD-10-CM

## 2021-03-05 DIAGNOSIS — M25512 Pain in left shoulder: Secondary | ICD-10-CM

## 2021-03-05 DIAGNOSIS — M0609 Rheumatoid arthritis without rheumatoid factor, multiple sites: Secondary | ICD-10-CM

## 2021-03-05 DIAGNOSIS — Z9889 Other specified postprocedural states: Secondary | ICD-10-CM | POA: Diagnosis not present

## 2021-03-05 DIAGNOSIS — M19072 Primary osteoarthritis, left ankle and foot: Secondary | ICD-10-CM

## 2021-03-05 DIAGNOSIS — I739 Peripheral vascular disease, unspecified: Secondary | ICD-10-CM

## 2021-03-05 DIAGNOSIS — Z79899 Other long term (current) drug therapy: Secondary | ICD-10-CM

## 2021-03-05 DIAGNOSIS — M24522 Contracture, left elbow: Secondary | ICD-10-CM

## 2021-03-05 DIAGNOSIS — I2510 Atherosclerotic heart disease of native coronary artery without angina pectoris: Secondary | ICD-10-CM

## 2021-03-05 DIAGNOSIS — J449 Chronic obstructive pulmonary disease, unspecified: Secondary | ICD-10-CM

## 2021-03-05 DIAGNOSIS — M24521 Contracture, right elbow: Secondary | ICD-10-CM | POA: Diagnosis not present

## 2021-03-05 DIAGNOSIS — G8929 Other chronic pain: Secondary | ICD-10-CM

## 2021-03-05 DIAGNOSIS — E782 Mixed hyperlipidemia: Secondary | ICD-10-CM

## 2021-03-05 DIAGNOSIS — M19042 Primary osteoarthritis, left hand: Secondary | ICD-10-CM

## 2021-03-05 MED ORDER — XELJANZ 5 MG PO TABS: 1.0000 | ORAL_TABLET | Freq: Every day | ORAL | 0 refills | Status: DC

## 2021-03-05 NOTE — Patient Instructions (Addendum)
[facility] [address] [place],  [place]  [address]  Main: [phone]  03/06/2021 8:30am

## 2021-03-05 NOTE — Telephone Encounter (Signed)
[name] refill pended. Please review and send to the pharmacy. Thanks!

## 2021-03-06 ENCOUNTER — Encounter: Payer: Self-pay | Admitting: Orthopedic Surgery

## 2021-03-06 ENCOUNTER — Ambulatory Visit: Payer: Medicare Other

## 2021-03-06 ENCOUNTER — Ambulatory Visit (INDEPENDENT_AMBULATORY_CARE_PROVIDER_SITE_OTHER): Payer: Medicare Other | Admitting: Orthopedic Surgery

## 2021-03-06 VITALS — BP 134/84 | HR 105 | Ht 69.0 in | Wt 166.0 lb

## 2021-03-06 DIAGNOSIS — M25512 Pain in left shoulder: Secondary | ICD-10-CM | POA: Diagnosis not present

## 2021-03-06 DIAGNOSIS — M12812 Other specific arthropathies, not elsewhere classified, left shoulder: Secondary | ICD-10-CM

## 2021-03-06 DIAGNOSIS — E1165 Type 2 diabetes mellitus with hyperglycemia: Secondary | ICD-10-CM | POA: Diagnosis not present

## 2021-03-06 DIAGNOSIS — G8929 Other chronic pain: Secondary | ICD-10-CM

## 2021-03-06 DIAGNOSIS — I10 Essential (primary) hypertension: Secondary | ICD-10-CM | POA: Diagnosis not present

## 2021-03-06 LAB — COMPLETE METABOLIC PANEL WITH GFR
AG Ratio: 1.4 (calc) (ref 1.0–2.5)
ALT: 14 U/L (ref 9–46)
AST: 16 U/L (ref 10–35)
Albumin: 4.5 g/dL (ref 3.6–5.1)
Alkaline phosphatase (APISO): 72 U/L (ref 35–144)
BUN: 16 mg/dL (ref 7–25)
CO2: 26 mmol/L (ref 20–32)
Calcium: 9.6 mg/dL (ref 8.6–10.3)
Chloride: 105 mmol/L (ref 98–110)
Creat: 0.87 mg/dL (ref 0.70–1.28)
Globulin: 3.2 g/dL (calc) (ref 1.9–3.7)
Glucose, Bld: 79 mg/dL (ref 65–99)
Potassium: 4.3 mmol/L (ref 3.5–5.3)
Sodium: 141 mmol/L (ref 135–146)
Total Bilirubin: 0.4 mg/dL (ref 0.2–1.2)
Total Protein: 7.7 g/dL (ref 6.1–8.1)
eGFR: 93 mL/min/{1.73_m2} (ref >=60)

## 2021-03-06 LAB — CBC WITH DIFFERENTIAL/PLATELET
Absolute Monocytes: 523 cells/uL (ref 200–950)
Basophils Absolute: 40 cells/uL (ref 0–200)
Basophils Relative: 0.6 %
Eosinophils Absolute: 107 cells/uL (ref 15–500)
Eosinophils Relative: 1.6 %
HCT: 40 % (ref 38.5–50.0)
Hemoglobin: 12.3 g/dL — ABNORMAL LOW (ref 13.2–17.1)
Lymphs Abs: 1387 cells/uL (ref 850–3900)
MCH: 26.1 pg — ABNORMAL LOW (ref 27.0–33.0)
MCHC: 30.8 g/dL — ABNORMAL LOW (ref 32.0–36.0)
MCV: 84.7 fL (ref 80.0–100.0)
MPV: 10.7 fL (ref 7.5–12.5)
Monocytes Relative: 7.8 %
Neutro Abs: 4643 cells/uL (ref 1500–7800)
Neutrophils Relative %: 69.3 %
Platelets: 356 10*3/uL (ref 140–400)
RBC: 4.72 10*6/uL (ref 4.20–5.80)
RDW: 15.7 % — ABNORMAL HIGH (ref 11.0–15.0)
Total Lymphocyte: 20.7 %
WBC: 6.7 10*3/uL (ref 3.8–10.8)

## 2021-03-06 NOTE — Progress Notes (Signed)
CBC stable. CMP WNL.

## 2021-03-06 NOTE — Patient Instructions (Signed)

## 2021-03-06 NOTE — Progress Notes (Signed)
New Patient Visit  Assessment: [name] is a 71 y.o. male with the following: 1. Rotator cuff arthropathy of left shoulder  Plan: Patient has left shoulder pain, that has been progressively worsening for the past 2-3 months.  We reviewed radiographs in clinic today which demonstrates glenohumeral arthritis, as well as proximal humeral migration.  Symptoms and radiographic evidence most consistent with left rotator cuff arthropathy.  We discussed multiple treatment options, and we will continue with attempts at pain control.  He is interested in an injection, and this was completed in clinic today.  We will see him back on an as-needed basis.  Depending on the efficacy of the injection, we may discuss reverse shoulder arthroplasty in more detail at the next visit.    Procedure note injection Left shoulder    Verbal consent was obtained to inject the left shoulder, subacromial space Timeout was completed to confirm the site of injection.  The skin was prepped with alcohol and ethyl chloride was sprayed at the injection site.  A 21-gauge needle was used to inject 40 mg of Depo-Medrol and 1% lidocaine (3 cc) into the subacromial space of the left shoulder using a posterolateral approach.  There were no complications. A sterile bandage was applied.     Follow-up: Return if symptoms worsen or fail to improve.  Subjective:  Chief Complaint  Patient presents with   Shoulder Pain    Lt shoulder pain. States he was told he has bone on bone in shoulder. Pt states 2-3 months he also fell from bed and it's been hurting every since.     History of Present Illness: [name] is a 71 y.o. male who has been referred to clinic today by Hazel Sams, PA-C for evaluation of left shoulder pain.  He states he has had severe pain in the left shoulder for the past 2-3 months.  He has had some issues in the past, and has previously been told he has bone-on-bone arthritis.  He states he fell out of  bed approximately 3 months ago, and has been severe ever since.  He takes medications for rheumatoid arthritis.  He also has pain medications for his neck.  Pain gets worse at night.  He has some difficulty with overhead motion.  Pain is primarily in the posterior and lateral aspect of the left shoulder.  No prior injections.  He has not worked with physical therapy.   Review of Systems: No fevers or chills No numbness or tingling No chest pain No shortness of breath No bowel or bladder dysfunction No GI distress No headaches   Medical History:  Past Medical History:  Diagnosis Date   Bronchitis    Carotid artery disease ([place])    Cervical spine disease    Neck surgery in 2007 with Dr. Joya Salm   COPD (chronic obstructive pulmonary disease) ([place])    Coronary atherosclerosis of native coronary artery    Nonobstrtuctive by catheterization 2007, 2013, 2021   DVT (deep venous thrombosis) ([place])    Essential hypertension    GERD (gastroesophageal reflux disease)    Hyperlipidemia    PAD (peripheral artery disease) ([place])    s/p left fem-pop bypass 2007; PTA and stenting of right iliac artery 09/2011   Rheumatoid arthritis(714.0)    Type 2 diabetes mellitus ([place])     Past Surgical History:  Procedure Laterality Date   ABDOMINAL AORTAGRAM N/A 10/03/2011   Procedure: ABDOMINAL Maxcine Ham;  Surgeon: Conrad , MD;  Location: [facility] CATH LAB;  Service: Cardiovascular;  Laterality: N/A;   ABDOMINAL AORTOGRAM W/LOWER EXTREMITY N/A 01/16/2017   Procedure: ABDOMINAL AORTOGRAM W/LOWER EXTREMITY;  Surgeon: Conrad Rockwall, MD;  Location: [place] CV LAB;  Service: Cardiovascular;  Laterality: N/A;   ABDOMINAL AORTOGRAM W/LOWER EXTREMITY Bilateral 08/04/2019   Procedure: ABDOMINAL AORTOGRAM W/LOWER EXTREMITY;  Surgeon: Marty Heck, MD;  Location: [place] CV LAB;  Service: Cardiovascular;  Laterality: Bilateral;   ABDOMINAL AORTOGRAM W/LOWER EXTREMITY N/A 03/09/2020   Procedure: ABDOMINAL  AORTOGRAM W/LOWER EXTREMITY;  Surgeon: Marty Heck, MD;  Location: [place] CV LAB;  Service: Cardiovascular;  Laterality: N/A;   CERVICAL DISC SURGERY     COLONOSCOPY WITH PROPOFOL N/A 02/17/2017   Procedure: COLONOSCOPY WITH PROPOFOL;  Surgeon: Daneil Dolin, MD;  [facility];  Service: Endoscopy;  Laterality: N/A;  2:45pm   FEMORAL-POPLITEAL BYPASS GRAFT  11/06/2005   ILIAC ARTERY STENT  10-03-11   Right CIA stenting   LEFT HEART CATH AND CORONARY ANGIOGRAPHY N/A 04/14/2019   Procedure: LEFT HEART CATH AND CORONARY ANGIOGRAPHY;  Surgeon: Troy Sine, MD;  Location: [place] CV LAB;  Service: Cardiovascular;  Laterality: N/A;   PERIPHERAL VASCULAR BALLOON ANGIOPLASTY Right 01/16/2017   Procedure: PERIPHERAL VASCULAR BALLOON ANGIOPLASTY;  Surgeon: Conrad Cambria, MD;  Location: [place] CV LAB;  Service: Cardiovascular;  Laterality: Right;  common iliac    PERIPHERAL VASCULAR BALLOON ANGIOPLASTY  03/09/2020   Procedure: PERIPHERAL VASCULAR BALLOON ANGIOPLASTY;  Surgeon: Marty Heck, MD;  Location: [place] CV LAB;  Service: Cardiovascular;;   PR VEIN BYPASS GRAFT,AORTO-FEM-POP  06-22-2010   Redo Left Fem-Pop   Repair of left arm fracture     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     SURGERY SCROTAL / TESTICULAR      Family History  Problem Relation Age of Onset   Heart attack Mother 62   Deep vein thrombosis Mother    Diabetes Mother    Hyperlipidemia Mother    Hypertension Mother    Peripheral vascular disease Mother    Stroke Father    Diabetes Father    Hyperlipidemia Father    Hypertension Father    Peripheral vascular disease Father    Diabetes Sister    Hyperlipidemia Sister    Hypertension Sister    Diabetes Brother    Hyperlipidemia Brother    Cancer Brother    Deep vein thrombosis Brother    Hypertension Brother    Peripheral vascular disease Brother    Hyperlipidemia Son    Cancer Son    Hypertension Son    Diabetes Daughter     Colon cancer Neg Hx    Social History   Tobacco Use   Smoking status: Former    Packs/day: 1.00    Years: 45.00    Pack years: 45.00    Types: Cigarettes, Cigars    Quit date: 09/01/2011    Years since quitting: 9.5   Smokeless tobacco: Never  Vaping Use   Vaping Use: Never used  Substance Use Topics   Alcohol use: Yes    Comment: 2-3 beers weekly   Drug use: No    No Known Allergies  No outpatient medications have been marked as taking for the 03/06/21 encounter (Office Visit) with Mordecai Rasmussen, MD.    Objective: BP 134/84    Pulse (!) 105    Ht 5\' 9"  (1.753 m)    Wt 166 lb (75.3 kg)    BMI 24.51 kg/m  Physical Exam:  General: Alert and oriented. and No acute distress. Gait: Normal gait.  Evaluation left shoulder demonstrates no deformity.  No atrophy is appreciated.  Forward flexion to approximately 130 degrees.  Internal rotation to the back pocket.  Pain with strength and resistance testing.  Fingers are warm and well-perfused.  He has a contracture of the elbow, and he is unable to achieve full extension.  IMAGING: I personally ordered and reviewed the following images   X-rays of the left shoulder were obtained in clinic today.  He has advanced degenerative changes, including superior osteophytes and loose bodies at the [facility] joint.  There is moderate glenohumeral joint space narrowing.  Small osteophytes are noted.  There is proximal humeral migration, with very little space between the undersurface of the acromion, and the superior aspect of the humeral head.  Impression: Left shoulder x-ray with advanced AC joint arthritis, and rotator cuff arthropathy.  New Medications:  No orders of the defined types were placed in this encounter.     Mordecai Rasmussen, MD  03/06/2021 8:45 AM

## 2021-03-20 NOTE — Progress Notes (Signed)
? ? ?Cardiology Office Note ? ?Date: 03/21/2021  ? ?ID: [name], DOB Sep 09, 1950, MRN [id number] ? ?PCP:  [name], MD  ?Cardiologist:  Rozann Lesches, MD ?Electrophysiologist:  None  ? ?Chief Complaint  ?Patient presents with  ? Cardiac follow-up  ? ? ?History of Present Illness: ?[name] is a 71 y.o. male last seen in January by Ms. Jorene Minors.  He is here for a follow-up visit.  He does not describe any obvious angina symptoms on current medical therapy.  Reports NYHA class II dyspnea with typical activities. ? ?I reviewed his medications.  He is tolerating Lipitor at 80 mg daily, dose was increased with his last LDL being 97 in January. ? ?He has stable claudication.  No leg ulcerations. ? ?Past Medical History:  ?Diagnosis Date  ? Bronchitis   ? Carotid artery disease ([place])   ? Cervical spine disease   ? Neck surgery in 2007 with Dr. Joya Salm  ? COPD (chronic obstructive pulmonary disease) ([place])   ? Coronary atherosclerosis of native coronary artery   ? Nonobstrtuctive by catheterization 2007, 2013, 2021  ? DVT (deep venous thrombosis) ([place])   ? Essential hypertension   ? GERD (gastroesophageal reflux disease)   ? Hyperlipidemia   ? PAD (peripheral artery disease) ([place])   ? s/p left fem-pop bypass 2007; PTA and stenting of right iliac artery 09/2011  ? Rheumatoid arthritis(714.0)   ? Type 2 diabetes mellitus ([place])   ? ? ?Past Surgical History:  ?Procedure Laterality Date  ? ABDOMINAL AORTAGRAM N/A 10/03/2011  ? Procedure: ABDOMINAL AORTAGRAM;  Surgeon: Conrad [place], MD;  Location: [facility] CATH LAB;  Service: Cardiovascular;  Laterality: N/A;  ? ABDOMINAL AORTOGRAM W/LOWER EXTREMITY N/A 01/16/2017  ? Procedure: ABDOMINAL AORTOGRAM W/LOWER EXTREMITY;  Surgeon: Conrad [place], MD;  Location: [place] CV LAB;  Service: Cardiovascular;  Laterality: N/A;  ? ABDOMINAL AORTOGRAM W/LOWER EXTREMITY Bilateral 08/04/2019  ? Procedure: ABDOMINAL AORTOGRAM W/LOWER EXTREMITY;  Surgeon: Marty Heck, MD;   Location: [place];  Service: Cardiovascular;  Laterality: Bilateral;  ? ABDOMINAL AORTOGRAM W/LOWER EXTREMITY N/A 03/09/2020  ? Procedure: ABDOMINAL AORTOGRAM W/LOWER EXTREMITY;  Surgeon: Marty Heck, MD;  Location: [place] CV LAB;  Service: Cardiovascular;  Laterality: N/A;  ? CERVICAL DISC SURGERY    ? COLONOSCOPY WITH PROPOFOL N/A 02/17/2017  ? Procedure: COLONOSCOPY WITH PROPOFOL;  Surgeon: Daneil Dolin, MD;  [facility];  Service: Endoscopy;  Laterality: N/A;  2:45pm  ? FEMORAL-POPLITEAL BYPASS GRAFT  11/06/2005  ? ILIAC ARTERY STENT  10-03-11  ? Right CIA stenting  ? LEFT HEART CATH AND CORONARY ANGIOGRAPHY N/A 04/14/2019  ? Procedure: LEFT HEART CATH AND CORONARY ANGIOGRAPHY;  Surgeon: Troy Sine, MD;  Location: [facility];  Service: Cardiovascular;  Laterality: N/A;  ? PERIPHERAL VASCULAR BALLOON ANGIOPLASTY Right 01/16/2017  ? Procedure: PERIPHERAL VASCULAR BALLOON ANGIOPLASTY;  Surgeon: Conrad Heil, MD;  Location: [place] [facility];  Service: Cardiovascular;  Laterality: Right;  common iliac ?  ? PERIPHERAL VASCULAR BALLOON ANGIOPLASTY  03/09/2020  ? Procedure: PERIPHERAL VASCULAR BALLOON ANGIOPLASTY;  Surgeon: Marty Heck, MD;  Location: [place] [facility];  Service: Cardiovascular;;  ? PR VEIN BYPASS GRAFT,AORTO-FEM-POP  06-22-2010  ? Redo Left Fem-Pop  ? Repair of left arm fracture    ? 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 SURGERY SCROTAL / TESTICULAR    ? ? ?Current Outpatient Medications  ?Medication Sig Dispense Refill  ?  aspirin EC 81 MG tablet Take 1 tablet (81 mg total) by mouth daily.    ? atorvastatin (LIPITOR) 80 MG tablet Take 1 tablet (80 mg total) by mouth daily. 90 tablet 3  ? budesonide-formoterol (SYMBICORT) 160-4.5 MCG/ACT inhaler Take 2 puffs first thing in am and then another 2 puffs about 12 hours later. 10.2 g 11  ? cilostazol (PLETAL) 100 MG tablet TAKE 1 TABLET(100 MG) BY MOUTH TWICE DAILY 60 tablet 5  ? Dexlansoprazole (DEXILANT)  30 MG capsule Take 30- 60 min before your first and last meals of the day (Patient taking differently: Take 30 mg by mouth daily.) 60 capsule 11  ? glipiZIDE (GLUCOTROL) 5 MG tablet Take 5 mg by mouth daily.    ? HYDROcodone-acetaminophen (NORCO) 7.5-325 MG per tablet Take 1 tablet by mouth 2 (two) times daily as needed for moderate pain.    ? isosorbide dinitrate (ISORDIL) 30 MG tablet Take 30 mg by mouth daily.    ? JANUVIA 100 MG tablet Take 100 mg by mouth daily.    ? JARDIANCE 25 MG TABS tablet Take 25 mg by mouth daily.    ? losartan (COZAAR) 25 MG tablet TAKE 1 TABLET(25 MG) BY MOUTH DAILY 90 tablet 0  ? metFORMIN (GLUCOPHAGE) 1000 MG tablet Take 500 mg by mouth daily.    ? nitroGLYCERIN (NITROSTAT) 0.4 MG SL tablet Place 1 tablet (0.4 mg total) under the tongue every 5 (five) minutes as needed for chest pain. 25 tablet 3  ? ONE TOUCH ULTRA TEST test strip     ? OneTouch Delica Lancets 86V MISC Apply topically.    ? PROAIR HFA 108 (90 BASE) MCG/ACT inhaler Inhale 2 puffs into the lungs every 6 (six) hours as needed for wheezing or shortness of breath.     ? sildenafil (REVATIO) 20 MG tablet Take 40-60 mg by mouth daily as needed for erectile dysfunction.    ? XELJANZ 5 MG TABS Take 1 tablet (5 mg total) by mouth daily. 90 tablet 0  ? clopidogrel (PLAVIX) 75 MG tablet Take 1 tablet (75 mg total) by mouth daily. (Patient not taking: Reported on 03/21/2021) 30 tablet 11  ? ?No current facility-administered medications for this visit.  ? ?Allergies:  Patient has no known allergies.  ? ?ROS: No orthopnea or PND. ? ?Physical Exam: ?VS:  BP 122/68   Pulse 67   Ht '5\' 9"'$  (1.753 m)   Wt 166 lb 9.6 oz (75.6 kg)   SpO2 95%   BMI 24.60 kg/m? , BMI Body mass index is 24.6 kg/m?. ? ?Wt Readings from Last 3 Encounters:  ?03/21/21 166 lb 9.6 oz (75.6 kg)  ?03/06/21 166 lb (75.3 kg)  ?03/05/21 166 lb 3.2 oz (75.4 kg)  ?  ?General: Patient appears comfortable at rest. ?HEENT: Conjunctiva and lids normal, wearing a  mask. ?Neck: Supple, no elevated JVP or carotid bruits, no thyromegaly. ?Lungs: Clear to auscultation, nonlabored breathing at rest. ?Cardiac: Regular rate and rhythm, no S3 or significant systolic murmur, no pericardial rub. ?Extremities: No pitting edema. ? ?ECG:  An ECG dated 01/19/2021 was personally reviewed today and demonstrated:  Sinus rhythm. ? ?Recent Labwork: ?08/14/2020: TSH 1.060 ?03/05/2021: ALT 14; AST 16; BUN 16; Creat 0.87; Hemoglobin 12.3; Platelets 356; Potassium 4.3; Sodium 141  ?   ?Component Value Date/Time  ? CHOL 182 01/25/2021 1118  ? CHOL 160 11/09/2019 1032  ? TRIG 86 01/25/2021 1118  ? HDL 68 01/25/2021 1118  ? HDL 61 11/09/2019 1032  ?  CHOLHDL 2.7 01/25/2021 1118  ? VLDL 17 01/25/2021 1118  ? [place] 97 01/25/2021 1118  ? [place] 71 05/05/2020 0000  ? ? ?Other Studies Reviewed Today: ? ?Cardiac catheterization 04/14/2019: ?Prox LAD to Mid LAD lesion is 20% stenosed. ?Mid LAD lesion is 30% stenosed. ?Prox RCA lesion is 30% stenosed. ?  ?Mild non-obstructive CAD with 20 and 30% irregularities in the proximal to mid LAD; normal ramus intermediate and left circumflex coronary arteries;  and RCA with smooth 30% proximal narrowing with distal vessel tortuosity. ?  ?Low normal global LV function with EF estimated at 50%; LVEDP 14 mmHg. ? ?ABIs 01/30/2021: ?+-------+-----------+-----------+------------+------------+  ?ABI/TBIToday's ABIToday's TBIPrevious ABIPrevious TBI  ?+-------+-----------+-----------+------------+------------+  ?Right  [place]         0.42       [place]          0.29          ?+-------+-----------+-----------+------------+------------+  ?Left   [place]         0.43       [place]          0.37          ?+-------+-----------+-----------+------------+------------+  ? ?Previous ABI on 07/31/20.  ?   ?Summary:  ?Right: Resting right ankle-brachial index indicates noncompressible right  ?lower extremity arteries. The right toe-brachial index is abnormal.  ? ?Left: Resting left  ankle-brachial index indicates noncompressible left  ?lower extremity arteries. The left toe-brachial index is abnormal.  ? ?Arterial Dopplers 01/30/2021: ?Summary:  ?Right: Patent with no stenosis visualized. The PTA appears occluded with  ?distal re

## 2021-03-21 ENCOUNTER — Ambulatory Visit (INDEPENDENT_AMBULATORY_CARE_PROVIDER_SITE_OTHER): Payer: Medicare Other | Admitting: Cardiology

## 2021-03-21 ENCOUNTER — Encounter: Payer: Self-pay | Admitting: Cardiology

## 2021-03-21 VITALS — BP 122/68 | HR 67 | Ht 69.0 in | Wt 166.6 lb

## 2021-03-21 DIAGNOSIS — I25119 Atherosclerotic heart disease of native coronary artery with unspecified angina pectoris: Secondary | ICD-10-CM | POA: Diagnosis not present

## 2021-03-21 DIAGNOSIS — I739 Peripheral vascular disease, unspecified: Secondary | ICD-10-CM | POA: Diagnosis not present

## 2021-03-21 DIAGNOSIS — E782 Mixed hyperlipidemia: Secondary | ICD-10-CM

## 2021-03-21 NOTE — Patient Instructions (Signed)
Medication Instructions:  ?Your physician recommends that you continue on your current medications as directed. Please refer to the Current Medication list given to you today.  ? ?Labwork: ?Fasting lipid panel within the next 6 months ? ?Testing/Procedures: ?none ? ?Follow-Up: ?Your physician recommends that you schedule a follow-up appointment in: 6 months ? ?Any Other Special Instructions Will Be Listed Below (If Applicable). ? ?If you need a refill on your cardiac medications before your next appointment, please call your pharmacy. ? ?

## 2021-03-22 ENCOUNTER — Ambulatory Visit: Payer: Medicare Other | Admitting: Cardiology

## 2021-03-30 DIAGNOSIS — Z7189 Other specified counseling: Secondary | ICD-10-CM | POA: Diagnosis not present

## 2021-03-30 DIAGNOSIS — Z6824 Body mass index (BMI) 24.0-24.9, adult: Secondary | ICD-10-CM | POA: Diagnosis not present

## 2021-03-30 DIAGNOSIS — Z0000 Encounter for general adult medical examination without abnormal findings: Secondary | ICD-10-CM | POA: Diagnosis not present

## 2021-03-30 DIAGNOSIS — I10 Essential (primary) hypertension: Secondary | ICD-10-CM | POA: Diagnosis not present

## 2021-03-30 DIAGNOSIS — Z299 Encounter for prophylactic measures, unspecified: Secondary | ICD-10-CM | POA: Diagnosis not present

## 2021-04-03 DIAGNOSIS — M4802 Spinal stenosis, cervical region: Secondary | ICD-10-CM | POA: Diagnosis not present

## 2021-04-03 DIAGNOSIS — M961 Postlaminectomy syndrome, not elsewhere classified: Secondary | ICD-10-CM | POA: Diagnosis not present

## 2021-04-03 DIAGNOSIS — M47812 Spondylosis without myelopathy or radiculopathy, cervical region: Secondary | ICD-10-CM | POA: Diagnosis not present

## 2021-04-03 DIAGNOSIS — Z79899 Other long term (current) drug therapy: Secondary | ICD-10-CM | POA: Diagnosis not present

## 2021-04-03 DIAGNOSIS — M5412 Radiculopathy, cervical region: Secondary | ICD-10-CM | POA: Diagnosis not present

## 2021-04-03 DIAGNOSIS — G629 Polyneuropathy, unspecified: Secondary | ICD-10-CM | POA: Diagnosis not present

## 2021-04-03 DIAGNOSIS — G5621 Lesion of ulnar nerve, right upper limb: Secondary | ICD-10-CM | POA: Diagnosis not present

## 2021-04-03 DIAGNOSIS — J449 Chronic obstructive pulmonary disease, unspecified: Secondary | ICD-10-CM | POA: Diagnosis not present

## 2021-04-05 DIAGNOSIS — I10 Essential (primary) hypertension: Secondary | ICD-10-CM | POA: Diagnosis not present

## 2021-04-05 DIAGNOSIS — E1165 Type 2 diabetes mellitus with hyperglycemia: Secondary | ICD-10-CM | POA: Diagnosis not present

## 2021-04-09 ENCOUNTER — Encounter: Payer: Self-pay | Admitting: Cardiology

## 2021-04-09 DIAGNOSIS — M109 Gout, unspecified: Secondary | ICD-10-CM | POA: Diagnosis not present

## 2021-04-09 DIAGNOSIS — I700 Atherosclerosis of aorta: Secondary | ICD-10-CM | POA: Diagnosis not present

## 2021-04-09 DIAGNOSIS — R000 Tachycardia, unspecified: Secondary | ICD-10-CM | POA: Diagnosis not present

## 2021-04-09 DIAGNOSIS — I10 Essential (primary) hypertension: Secondary | ICD-10-CM | POA: Diagnosis not present

## 2021-04-09 DIAGNOSIS — Z299 Encounter for prophylactic measures, unspecified: Secondary | ICD-10-CM | POA: Diagnosis not present

## 2021-04-26 ENCOUNTER — Other Ambulatory Visit: Payer: Self-pay | Admitting: Vascular Surgery

## 2021-04-27 DIAGNOSIS — J449 Chronic obstructive pulmonary disease, unspecified: Secondary | ICD-10-CM | POA: Diagnosis not present

## 2021-04-27 DIAGNOSIS — I10 Essential (primary) hypertension: Secondary | ICD-10-CM | POA: Diagnosis not present

## 2021-04-27 DIAGNOSIS — Z299 Encounter for prophylactic measures, unspecified: Secondary | ICD-10-CM | POA: Diagnosis not present

## 2021-04-27 DIAGNOSIS — R000 Tachycardia, unspecified: Secondary | ICD-10-CM | POA: Diagnosis not present

## 2021-04-27 DIAGNOSIS — M109 Gout, unspecified: Secondary | ICD-10-CM | POA: Diagnosis not present

## 2021-05-06 DIAGNOSIS — I10 Essential (primary) hypertension: Secondary | ICD-10-CM | POA: Diagnosis not present

## 2021-05-06 DIAGNOSIS — E1165 Type 2 diabetes mellitus with hyperglycemia: Secondary | ICD-10-CM | POA: Diagnosis not present

## 2021-05-07 ENCOUNTER — Telehealth: Payer: Self-pay | Admitting: Cardiology

## 2021-05-07 NOTE — Telephone Encounter (Signed)
Call placed to [facility] to request OV note & EKG.  ?

## 2021-05-07 NOTE — Telephone Encounter (Signed)
Went to see pcp for foot, but found out he had rapid heart rate.  Had been having the rapid heart rate around 6-7 months.  Has never been evaluated for this from cardiology before.  SOB - has COPD but does seem to more noticeable lately.  Chest pain - not new, has had this off / on for a bit.  States pcp give him Toprol XL '25mg'$  daily  - has not started yet as he wanted to get approval from [place] first and did not feel comfortable after reading the side effects.   ? ?Will place notes from pcp on provider desk for review.  ?

## 2021-05-07 NOTE — Telephone Encounter (Signed)
Patient c/o Palpitations:  High priority if patient c/o lightheadedness, shortness of breath, or chest pain ? ?How long have you had palpitations/irregular HR/ Afib? Are you having the symptoms now? Irregular HR, states he was told by his PCP a week ago that his HR was irregular ? ?Are you currently experiencing lightheadedness, SOB or CP? no ? ?Do you have a history of afib (atrial fibrillation) or irregular heart rhythm? no ? ?Have you checked your BP or HR? (document readings if available): states his HR was running around 126 at his PCP's office. His regular HR is normally between 60-90. ? ?Are you experiencing any other symptoms? Denied any symptoms  ?

## 2021-05-08 ENCOUNTER — Encounter: Payer: Self-pay | Admitting: Podiatry

## 2021-05-08 ENCOUNTER — Ambulatory Visit (INDEPENDENT_AMBULATORY_CARE_PROVIDER_SITE_OTHER): Payer: Medicare Other | Admitting: Podiatry

## 2021-05-08 DIAGNOSIS — M25572 Pain in left ankle and joints of left foot: Secondary | ICD-10-CM | POA: Diagnosis not present

## 2021-05-08 DIAGNOSIS — M0609 Rheumatoid arthritis without rheumatoid factor, multiple sites: Secondary | ICD-10-CM

## 2021-05-08 DIAGNOSIS — G8929 Other chronic pain: Secondary | ICD-10-CM

## 2021-05-08 DIAGNOSIS — M79675 Pain in left toe(s): Secondary | ICD-10-CM

## 2021-05-08 DIAGNOSIS — B351 Tinea unguium: Secondary | ICD-10-CM

## 2021-05-08 DIAGNOSIS — E1159 Type 2 diabetes mellitus with other circulatory complications: Secondary | ICD-10-CM | POA: Diagnosis not present

## 2021-05-08 DIAGNOSIS — M19071 Primary osteoarthritis, right ankle and foot: Secondary | ICD-10-CM

## 2021-05-08 DIAGNOSIS — M19072 Primary osteoarthritis, left ankle and foot: Secondary | ICD-10-CM

## 2021-05-08 DIAGNOSIS — M79674 Pain in right toe(s): Secondary | ICD-10-CM | POA: Diagnosis not present

## 2021-05-08 NOTE — Telephone Encounter (Signed)
Left message to return call 

## 2021-05-08 NOTE — Progress Notes (Addendum)
?Subjective:  ?Patient ID: [name], male    DOB: March 01, 1950,  MRN: [id number] ? ?[name] presents to clinic today for at risk foot care. Pt has h/o RA (on immunosuppressive therapy), NIDDM with PAD and painful elongated mycotic toenails 1-5 bilaterally which are tender when wearing enclosed shoe gear. Pain is relieved with periodic professional debridement. ? ?Patient states blood glucose was 98 mg/dl today.   ? ?Last known HgA1c was 7.9%. ? ?New problem(s):.  Patient relates recent episode of left foot swelling and pain around lateral ankle area. He states this occurred and resolved. He has noticed the top of his left foot is slightly swollen when compared to the right foot. He denies any insect bite or trauma to the foot. He states he saw PCP and blood work came back negative for gout. He was seen by Rheumatology who performed a thorough examination with xrays in February   He did have tenderness of left ankle at Rheumatology examination in February, but no synovitis. ? ?Patient does have his own doppler at home and states he was able to hear doppler sounds on the PT and DP of the left foot. ? ?PCP is [name], MD , and last visit was last week per patient recollection. ? ?No Known Allergies ? ?Review of Systems: Negative except as noted in the HPI. ? ?Objective:There were no vitals filed for this visit. ? ?[name] is a pleasant 71 y.o. male in NAD. AAO X 3. ? ?Vascular Examination: ?CFT delayed b/l LE. Faintly palpable DP pulse(s) RLE. Diminished PT pulse(s) b/l LE. Diminished DP pulse(s) left lower extremity. No pain with calf compression b/l. Lower extremity skin temperature gradient warm to cool. Trace edema noted LLE. No ischemia or gangrene noted b/l LE. No cyanosis or clubbing noted b/l LE. ? ?Dermatological Examination: ?Pedal skin thin, shiny and atrophic b/l LE. No open wounds b/l LE. No interdigital macerations noted b/l LE. Toenails 1-5 bilaterally elongated, discolored,  dystrophic, thickened, and crumbly with subungual debris and tenderness to dorsal palpation. Area of postinflammatory hyperpigmentation dorsal aspect of left foot. ? ?Musculoskeletal Examination: ?Muscle strength 5/5 to all lower extremity muscle groups bilaterally. HAV with bunion deformity noted b/l LE. Hammertoe deformity noted 2-5 b/l. ? ?Neurological Examination: ?Pt has subjective symptoms of neuropathy. Protective sensation intact 5/5 intact bilaterally with 10g monofilament b/l. Vibratory sensation intact b/l. ? ? LOWER EXTREMITY DOPPLER STUDY  ? ?Patient Name:  [name]  Date of Exam:   01/30/2021  ?Medical Rec #: [id number]      Accession #:    [id number]  ?Date of Birth: 07/09/1950     Patient Gender: M  ?Patient Age:   34 years  ?Exam Location:  [facility]  ?Procedure:      VAS [place] ABI WITH/WO TBI  ?Referring Phys: Dagoberto Ligas  ? ? ?---------------------------------------------------------------------------  ?-----  ?   ?Indications: Peripheral artery disease.  ? ?High Risk Factors: Hypertension, hyperlipidemia.  ? ? ? Vascular Interventions: 03/09/2020: Right mid SFA angioplasty with  ?drug-coated  ?                        balloon. Right proximal anterior tibial artery  ?                        angioplasty.  ?  08/04/2019: Angiogram with no intervention.  ?                        2013: Right common iliac artery angioplasty and  ?stent.  ?                        2007: Left common femoral to above knee popliteal  ?                        bypass.  ? ?Performing Technologist: Ralene Cork RVT  ? ?   ?Examination Guidelines: A complete evaluation includes at minimum, Doppler  ?waveform signals and systolic blood pressure reading at the level of  ?bilateral  ?brachial, anterior tibial, and posterior tibial arteries, when vessel  ?segments  ?are accessible. Bilateral testing is considered an integral part of a  ?complete  ?examination. Photoelectric  Plethysmograph (PPG) waveforms and toe systolic  ?pressure readings are included as required and additional duplex testing  ?as  ?needed. Limited examinations for reoccurring indications may be performed  ?as  ?noted.  ? ?   ?ABI Findings:  ?+---------+------------------+-----+----------+-------------------+  ?Right    Rt Pressure (mmHg)IndexWaveform  Comment              ?+---------+------------------+-----+----------+-------------------+  ?Brachial 142                                                   ?+---------+------------------+-----+----------+-------------------+  ?PTA      153               1.08 monophasicProbable collateral  ?+---------+------------------+-----+----------+-------------------+  ?DP       255               1.80 monophasic                     ?+---------+------------------+-----+----------+-------------------+  ?[place] To[place]59                0.42                                ?+---------+------------------+-----+----------+-------------------+  ? ?+---------+------------------+-----+----------+-------------------+  ?Left     Lt Pressure (mmHg)IndexWaveform  Comment              ?+---------+------------------+-----+----------+-------------------+  ?Brachial 138                                                   ?+---------+------------------+-----+----------+-------------------+  ?PTA      215               1.51 monophasicProbable collateral  ?+---------+------------------+-----+----------+-------------------+  ?DP       255               1.80 monophasic                     ?+---------+------------------+-----+----------+-------------------+  ?[place] Toe61                0.43                                ?+---------+------------------+-----+----------+-------------------+  ? ?+-------+-----------+-----------+------------+------------+  ?  ABI/TBIToday's ABIToday's TBIPrevious ABIPrevious TBI   ?+-------+-----------+-----------+------------+------------+  ?Right  [place]         0.42       [place]          0.29          ?+-------+-----------+-----------+------------+------------+  ?Left   [place]         0.43       [place]          0.37          ?+-------+-----------+-----------+------------+------------+  ? ?  Previous ABI on 07/31/20.  ?   ?Summary:  ?Right: Resting right ankle-brachial index indicates noncompressible right  ?lower extremity arteries. The right toe-brachial index is abnormal.  ? ?Left: Resting left ankle-brachial index indicates noncompressible left  ?lower extremity arteries. The left toe-brachial index is abnormal.  ? ? ?   ?*See table(s) above for measurements and observations.  ?   ? ? ?Electronically signed by Monica Martinez MD on 01/30/2021 at 10:41:14 AM.  ? ?   ? ? ?  Final   ? ?Assessment/Plan: ?1. Pain due to onychomycosis of toenails of both feet   ?2. Chronic pain of left ankle   ?3. Primary osteoarthritis of both feet   ?4. 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 factor ([place])   ?5. Type 2 diabetes mellitus with vascular disease ([place])   ?  ?-Patient was evaluated and treated. All patient's and/or POA's questions/concerns answered on today's visit. ?-He also inquired about obtaining a copy of his medical records and I informed him to see the front desk personnel who could assist him with this. ?-He was seen by Rheumatology in February who diagnosed him with primary OA of the foot and combination of RA/OA could cause his symptoms. ?-He has trace edema left foot. No breaks in skin to indicate infectious process. I will have him see Dr. Lanae Crumbly in our office for further evaluation of his left ankle after he is assessed by Vascular. While I do not see any acute issue, I have asked him to see Vascular to make sure he has not developed any new blockages. On last doppler, he had [place] vessels with right TBI 0.42 and left TBI of 0.43. I would like him to follow up  with Vascular to make sure he doesn't have any new developments. This is printed on his AVS. ?-Mycotic toenails 1-5 bilaterally were debrided in length and girth with sterile nail nippers and dremel without incident. ?-Patient/POA to call should there be question/concern in the interim.  ? ?Return in about 9 weeks (around 07/10/2021). ?

## 2021-05-08 NOTE — Telephone Encounter (Signed)
I reviewed the information from [facility] internal medicine.  Visit note from April 21 provided at which point heart rate recorded at 116 bpm.  ECG provided from April 1 showing sinus tachycardia.  I would suggest having him come in for a nurse visit and get repeat ECG on metoprolol.  We can decide if he needs a cardiac monitor from there. ?

## 2021-05-08 NOTE — Patient Instructions (Signed)
Follow up with Vascular Surgery for left foot pain. ? ?I will schedule you with Dr. Sherryle Lis for evaluation of swelling/ankle pain after Vascular evaluation. ?

## 2021-05-10 NOTE — Telephone Encounter (Signed)
? ?  Pt is returning call, he said to call him after 12 pm today ?

## 2021-05-10 NOTE — Telephone Encounter (Signed)
Patient notified and verbalized understanding.  ? ?Offered for patient to come this evening but declined due to having to take wife to work.  ? ?Nurse visit scheduled for Thursday, 05/17/2021 at 9:00 am. ? ?Patient states that he has not started the Toprol yet - does not want to begin till after nurse visit.   ?

## 2021-05-14 ENCOUNTER — Other Ambulatory Visit: Payer: Self-pay | Admitting: *Deleted

## 2021-05-14 ENCOUNTER — Telehealth: Payer: Self-pay | Admitting: *Deleted

## 2021-05-14 DIAGNOSIS — Z95828 Presence of other vascular implants and grafts: Secondary | ICD-10-CM

## 2021-05-14 DIAGNOSIS — I739 Peripheral vascular disease, unspecified: Secondary | ICD-10-CM

## 2021-05-14 DIAGNOSIS — I779 Disorder of arteries and arterioles, unspecified: Secondary | ICD-10-CM

## 2021-05-14 NOTE — Telephone Encounter (Signed)
Patient called c/o worsening pain and swelling of LEFT lower extremity with calf pain as well, making it difficult to walk.  An appointment was scheduled for ABI and left arterial duplex with an office visit with Dr Carlis Abbott.  Patient voiced understanding of the instructions. ?

## 2021-05-15 ENCOUNTER — Ambulatory Visit (INDEPENDENT_AMBULATORY_CARE_PROVIDER_SITE_OTHER): Payer: Medicare Other | Admitting: Vascular Surgery

## 2021-05-15 ENCOUNTER — Ambulatory Visit (HOSPITAL_COMMUNITY)
Admission: RE | Admit: 2021-05-15 | Discharge: 2021-05-15 | Disposition: A | Discharge status: Home / Self Care | Payer: Medicare Other | Source: Ambulatory Visit | Attending: Vascular Surgery | Admitting: Vascular Surgery

## 2021-05-15 ENCOUNTER — Ambulatory Visit (INDEPENDENT_AMBULATORY_CARE_PROVIDER_SITE_OTHER)
Admission: RE | Admit: 2021-05-15 | Discharge: 2021-05-15 | Disposition: A | Discharge status: Home / Self Care | Payer: Medicare Other | Source: Ambulatory Visit | Attending: Vascular Surgery | Admitting: Vascular Surgery

## 2021-05-15 VITALS — BP 115/81 | HR 107 | Temp 98.4°F | Resp 18 | Ht 69.0 in | Wt 165.0 lb

## 2021-05-15 DIAGNOSIS — I739 Peripheral vascular disease, unspecified: Secondary | ICD-10-CM

## 2021-05-15 DIAGNOSIS — I779 Disorder of arteries and arterioles, unspecified: Secondary | ICD-10-CM

## 2021-05-15 DIAGNOSIS — Z95828 Presence of other vascular implants and grafts: Secondary | ICD-10-CM

## 2021-05-15 DIAGNOSIS — I70213 Atherosclerosis of native arteries of extremities with intermittent claudication, bilateral legs: Secondary | ICD-10-CM

## 2021-05-15 DIAGNOSIS — I70612 Atherosclerosis of nonbiological bypass graft(s) of the extremities with intermittent claudication, left leg: Secondary | ICD-10-CM | POA: Diagnosis not present

## 2021-05-15 NOTE — Progress Notes (Signed)
? ? ?Patient name: Valentine Barney MRN: [id number] DOB: 06/09/1950 Sex: male ? ?REASON FOR CONSULT: Triage visit for left leg pain ? ?HPI: ?Braeden Kennan is a 71 y.o. male, with history of DM, PAD, hypertension, CAD, COPD that presents for triage visit for left leg pain.  States that he has been having pain behind his left knee including in the calf.  He was seen by multiple other physicians and told maybe this was a circulation problem.  He states he stopped taking some over-the-counter joint medicine and this is now improved. ? ?He has had a previous left femoral to above-knee pop bypass with PTFE in 2007 by Dr. Kellie Simmering.  Also a previous right common iliac artery stent.  I previously performed arteriogram on him last year in 2022 and performed a right SFA angioplasty with drug-coated balloon including a right proximal anterior tibial angioplasty. ? ?Past Medical History:  ?Diagnosis Date  ? Bronchitis   ? Carotid artery disease ([place])   ? Cervical spine disease   ? Neck surgery in 2007 with Dr. Joya Salm  ? COPD (chronic obstructive pulmonary disease) ([place])   ? Coronary atherosclerosis of native coronary artery   ? Nonobstrtuctive by catheterization 2007, 2013, 2021  ? DVT (deep venous thrombosis) ([place])   ? Essential hypertension   ? GERD (gastroesophageal reflux disease)   ? Hyperlipidemia   ? PAD (peripheral artery disease) ([place])   ? s/p left fem-pop bypass 2007; PTA and stenting of right iliac artery 09/2011  ? Rheumatoid arthritis(714.0)   ? Type 2 diabetes mellitus ([place])   ? ? ?Past Surgical History:  ?Procedure Laterality Date  ? ABDOMINAL AORTAGRAM N/A 10/03/2011  ? Procedure: ABDOMINAL AORTAGRAM;  Surgeon: Conrad Scotland, MD;  Location: [facility] CATH LAB;  Service: Cardiovascular;  Laterality: N/A;  ? ABDOMINAL AORTOGRAM W/LOWER EXTREMITY N/A 01/16/2017  ? Procedure: ABDOMINAL AORTOGRAM W/LOWER EXTREMITY;  Surgeon: Conrad Joice, MD;  Location: [place] CV LAB;  Service: Cardiovascular;  Laterality: N/A;  ? ABDOMINAL  AORTOGRAM W/LOWER EXTREMITY Bilateral 08/04/2019  ? Procedure: ABDOMINAL AORTOGRAM W/LOWER EXTREMITY;  Surgeon: Marty Heck, MD;  Location: [place] CV LAB;  Service: Cardiovascular;  Laterality: Bilateral;  ? ABDOMINAL AORTOGRAM W/LOWER EXTREMITY N/A 03/09/2020  ? Procedure: ABDOMINAL AORTOGRAM W/LOWER EXTREMITY;  Surgeon: Marty Heck, MD;  Location: [place] CV LAB;  Service: Cardiovascular;  Laterality: N/A;  ? CERVICAL DISC SURGERY    ? COLONOSCOPY WITH PROPOFOL N/A 02/17/2017  ? Procedure: COLONOSCOPY WITH PROPOFOL;  Surgeon: Daneil Dolin, MD;  [facility];  Service: Endoscopy;  Laterality: N/A;  2:45pm  ? FEMORAL-POPLITEAL BYPASS GRAFT  11/06/2005  ? ILIAC ARTERY STENT  10-03-11  ? Right CIA stenting  ? LEFT HEART CATH AND CORONARY ANGIOGRAPHY N/A 04/14/2019  ? Procedure: LEFT HEART CATH AND CORONARY ANGIOGRAPHY;  Surgeon: Troy Sine, MD;  Location: [place] CV LAB;  Service: Cardiovascular;  Laterality: N/A;  ? PERIPHERAL VASCULAR BALLOON ANGIOPLASTY Right 01/16/2017  ? Procedure: PERIPHERAL VASCULAR BALLOON ANGIOPLASTY;  Surgeon: Conrad , MD;  Location: [place] CV LAB;  Service: Cardiovascular;  Laterality: Right;  common iliac ?  ? PERIPHERAL VASCULAR BALLOON ANGIOPLASTY  03/09/2020  ? Procedure: PERIPHERAL VASCULAR BALLOON ANGIOPLASTY;  Surgeon: Marty Heck, MD;  Location: [place] CV LAB;  Service: Cardiovascular;;  ? PR VEIN BYPASS GRAFT,AORTO-FEM-POP  06-22-2010  ? Redo Left Fem-Pop  ? Repair of left arm fracture    ? 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  SURGERY SCROTAL / TESTICULAR    ? ? ?Family History  ?Problem Relation Age of Onset  ? Heart attack Mother 24  ? Deep vein thrombosis Mother   ? Diabetes Mother   ? Hyperlipidemia Mother   ? Hypertension Mother   ? Peripheral vascular disease Mother   ? Stroke Father   ? Diabetes Father   ? Hyperlipidemia Father   ? Hypertension Father   ? Peripheral vascular disease Father   ? Diabetes Sister   ?  Hyperlipidemia Sister   ? Hypertension Sister   ? Diabetes Brother   ? Hyperlipidemia Brother   ? Cancer Brother   ? Deep vein thrombosis Brother   ? Hypertension Brother   ? Peripheral vascular disease Brother   ? Hyperlipidemia Son   ? Cancer Son   ? Hypertension Son   ? Diabetes Daughter   ? Colon cancer Neg Hx   ? ? ?SOCIAL HISTORY: ?Social History  ? ?Socioeconomic History  ? Marital status: Single  ?  Spouse name: Not on file  ? Number of children: Not on file  ? Years of education: Not on file  ? Highest education level: Not on file  ?Occupational History  ? Not on file  ?Tobacco Use  ? Smoking status: Former  ?  Packs/day: 1.00  ?  Years: 45.00  ?  Pack years: 45.00  ?  Types: Cigarettes, Cigars  ?  Quit date: 09/01/2011  ?  Years since quitting: 9.7  ? Smokeless tobacco: Never  ?Vaping Use  ? Vaping Use: Never used  ?Substance and Sexual Activity  ? Alcohol use: Yes  ?  Comment: 2-3 beers weekly  ? Drug use: No  ? Sexual activity: Not Currently  ?  Birth control/protection: None  ?Other Topics Concern  ? Not on file  ?Social History Narrative  ? The patient lives in [place].   ? ?Social Determinants of Health  ? ?Financial Resource Strain: Not on file  ?Food Insecurity: Not on file  ?Transportation Needs: Not on file  ?Physical Activity: Not on file  ?Stress: Not on file  ?Social Connections: Not on file  ?Intimate Partner Violence: Not on file  ? ? ?No Known Allergies ? ?Current Outpatient Medications  ?Medication Sig Dispense Refill  ? aspirin EC 81 MG tablet Take 1 tablet (81 mg total) by mouth daily.    ? budesonide-formoterol (SYMBICORT) 160-4.5 MCG/ACT inhaler Take 2 puffs first thing in am and then another 2 puffs about 12 hours later. 10.2 g 11  ? cilostazol (PLETAL) 100 MG tablet TAKE 1 TABLET(100 MG) BY MOUTH TWICE DAILY 60 tablet 5  ? clopidogrel (PLAVIX) 75 MG tablet Take 1 tablet (75 mg total) by mouth daily. 30 tablet 11  ? Dexlansoprazole (DEXILANT) 30 MG capsule Take 30- 60 min before your first  and last meals of the day (Patient taking differently: Take 30 mg by mouth daily.) 60 capsule 11  ? glipiZIDE (GLUCOTROL) 5 MG tablet Take 5 mg by mouth daily.    ? HYDROcodone-acetaminophen (NORCO) 7.5-325 MG per tablet Take 1 tablet by mouth 2 (two) times daily as needed for moderate pain.    ? isosorbide dinitrate (ISORDIL) 30 MG tablet Take 30 mg by mouth daily.    ? JANUVIA 100 MG tablet Take 100 mg by mouth daily.    ? JARDIANCE 25 MG TABS tablet Take 25 mg by mouth daily.    ? losartan (COZAAR) 25 MG tablet TAKE 1 TABLET(25 MG) BY MOUTH  DAILY 90 tablet 0  ? metFORMIN (GLUCOPHAGE) 1000 MG tablet Take 500 mg by mouth daily.    ? metFORMIN (GLUCOPHAGE) 500 MG tablet Take 500 mg by mouth daily.    ? metoprolol succinate (TOPROL-XL) 25 MG 24 hr tablet Take 25 mg by mouth daily.    ? nitroGLYCERIN (NITROSTAT) 0.4 MG SL tablet Place 1 tablet (0.4 mg total) under the tongue every 5 (five) minutes as needed for chest pain. 25 tablet 3  ? ONE TOUCH ULTRA TEST test strip     ? OneTouch Delica Lancets 16X MISC Apply topically.    ? PROAIR HFA 108 (90 BASE) MCG/ACT inhaler Inhale 2 puffs into the lungs every 6 (six) hours as needed for wheezing or shortness of breath.     ? sildenafil (REVATIO) 20 MG tablet Take 40-60 mg by mouth daily as needed for erectile dysfunction.    ? SPIRIVA RESPIMAT 2.5 MCG/ACT AERS SMARTSIG:2 Puff(s) Via Inhaler Daily    ? XELJANZ 5 MG TABS Take 1 tablet (5 mg total) by mouth daily. 90 tablet 0  ? atorvastatin (LIPITOR) 80 MG tablet Take 1 tablet (80 mg total) by mouth daily. 90 tablet 3  ? ?No current facility-administered medications for this visit.  ? ? ?REVIEW OF SYSTEMS:  ?'[X]'$  denotes positive finding, '[ ]'$  denotes negative finding ?Cardiac  Comments:  ?Chest pain or chest pressure:    ?Shortness of breath upon exertion:    ?Short of breath when lying flat:    ?Irregular heart rhythm:    ?    ?Vascular    ?Pain in calf, thigh, or hip brought on by ambulation:    ?Pain in feet at night that  wakes you up from your sleep:     ?Blood clot in your veins:    ?Leg swelling:     ?    ?Pulmonary    ?Oxygen at home:    ?Productive cough:     ?Wheezing:     ?    ?Neurologic    ?Sudden weakness in arm

## 2021-05-17 ENCOUNTER — Ambulatory Visit (INDEPENDENT_AMBULATORY_CARE_PROVIDER_SITE_OTHER): Payer: Medicare Other

## 2021-05-17 VITALS — BP 112/64 | HR 100 | Ht 69.0 in | Wt 166.4 lb

## 2021-05-17 DIAGNOSIS — I739 Peripheral vascular disease, unspecified: Secondary | ICD-10-CM

## 2021-05-17 NOTE — Progress Notes (Signed)
Presents for EKG per Dr. Domenic Polite (per telephone note). Denies any symptoms. Medications and vitals reviewed. ?States that he was prescribed metoprolol Succinate 25 mg once a day by Tamela Gammon and after reading up on this mediation, feels that he needs to discuss with Dr. Domenic Polite before taking out of fear that there will be some interference with his current medical conditions. ? ? ?

## 2021-05-18 ENCOUNTER — Other Ambulatory Visit: Payer: Self-pay | Admitting: *Deleted

## 2021-05-18 DIAGNOSIS — Z789 Other specified health status: Secondary | ICD-10-CM | POA: Diagnosis not present

## 2021-05-18 DIAGNOSIS — I779 Disorder of arteries and arterioles, unspecified: Secondary | ICD-10-CM

## 2021-05-18 DIAGNOSIS — Z95828 Presence of other vascular implants and grafts: Secondary | ICD-10-CM

## 2021-05-18 DIAGNOSIS — I10 Essential (primary) hypertension: Secondary | ICD-10-CM | POA: Diagnosis not present

## 2021-05-18 DIAGNOSIS — I70213 Atherosclerosis of native arteries of extremities with intermittent claudication, bilateral legs: Secondary | ICD-10-CM

## 2021-05-18 DIAGNOSIS — Z299 Encounter for prophylactic measures, unspecified: Secondary | ICD-10-CM | POA: Diagnosis not present

## 2021-05-18 DIAGNOSIS — M109 Gout, unspecified: Secondary | ICD-10-CM | POA: Diagnosis not present

## 2021-05-18 DIAGNOSIS — M069 Rheumatoid arthritis, unspecified: Secondary | ICD-10-CM | POA: Diagnosis not present

## 2021-05-23 DIAGNOSIS — Z299 Encounter for prophylactic measures, unspecified: Secondary | ICD-10-CM | POA: Diagnosis not present

## 2021-05-23 DIAGNOSIS — Z6823 Body mass index (BMI) 23.0-23.9, adult: Secondary | ICD-10-CM | POA: Diagnosis not present

## 2021-05-23 DIAGNOSIS — Z0000 Encounter for general adult medical examination without abnormal findings: Secondary | ICD-10-CM | POA: Diagnosis not present

## 2021-05-23 DIAGNOSIS — I10 Essential (primary) hypertension: Secondary | ICD-10-CM | POA: Diagnosis not present

## 2021-05-23 DIAGNOSIS — M545 Low back pain, unspecified: Secondary | ICD-10-CM | POA: Diagnosis not present

## 2021-05-23 DIAGNOSIS — E1165 Type 2 diabetes mellitus with hyperglycemia: Secondary | ICD-10-CM | POA: Diagnosis not present

## 2021-05-23 DIAGNOSIS — R5383 Other fatigue: Secondary | ICD-10-CM | POA: Diagnosis not present

## 2021-05-23 DIAGNOSIS — E119 Type 2 diabetes mellitus without complications: Secondary | ICD-10-CM | POA: Diagnosis not present

## 2021-05-23 DIAGNOSIS — E7800 Pure hypercholesterolemia, unspecified: Secondary | ICD-10-CM | POA: Diagnosis not present

## 2021-05-31 ENCOUNTER — Telehealth: Payer: Self-pay | Admitting: Cardiology

## 2021-05-31 NOTE — Telephone Encounter (Signed)
Spoke with patient this morning - had nurse visit on 05/17/2021 for EKG & advice as to whether or not he should take the Toprol XL '25mg'$  daily that his pcp prescribed.  Has not heard back yet.  Looks like nurse visit was sent to Dr. Harl Bowie as Dr. Domenic Polite was off this day, but unable to find on his desktop.  Patient states his BP yesterday was 100/67  HR 114.  States that he goes to work out every morning but daughter tells he doesn't drink enough water.  He does mention that if he has been out working in the heat he may grab a cold beer instead of water.  Also mentions that he does notice some dizziness occasionally when he bends over to quickly.

## 2021-05-31 NOTE — Telephone Encounter (Signed)
Calling to get results from his procedure and find out bout rather he continue to take this medication. Please advise

## 2021-06-05 DIAGNOSIS — I10 Essential (primary) hypertension: Secondary | ICD-10-CM | POA: Diagnosis not present

## 2021-06-05 DIAGNOSIS — E1165 Type 2 diabetes mellitus with hyperglycemia: Secondary | ICD-10-CM | POA: Diagnosis not present

## 2021-06-05 MED ORDER — LOSARTAN POTASSIUM 25 MG PO TABS: 12.5000 mg | ORAL_TABLET | Freq: Every day | ORAL | Status: DC

## 2021-06-05 NOTE — Telephone Encounter (Signed)
Patient notified and verbalized understanding. 

## 2021-06-05 NOTE — Telephone Encounter (Signed)
Py calling to f/u on call from last week regarding EKG results as well as to speak to the nurse about dizziness that he has been experiencing. Please advise

## 2021-06-11 ENCOUNTER — Other Ambulatory Visit: Payer: Self-pay | Admitting: Cardiology

## 2021-06-22 DIAGNOSIS — Z789 Other specified health status: Secondary | ICD-10-CM | POA: Diagnosis not present

## 2021-06-22 DIAGNOSIS — I10 Essential (primary) hypertension: Secondary | ICD-10-CM | POA: Diagnosis not present

## 2021-06-22 DIAGNOSIS — M25511 Pain in right shoulder: Secondary | ICD-10-CM | POA: Diagnosis not present

## 2021-06-22 DIAGNOSIS — Z299 Encounter for prophylactic measures, unspecified: Secondary | ICD-10-CM | POA: Diagnosis not present

## 2021-07-05 DIAGNOSIS — E1165 Type 2 diabetes mellitus with hyperglycemia: Secondary | ICD-10-CM | POA: Diagnosis not present

## 2021-07-05 DIAGNOSIS — I10 Essential (primary) hypertension: Secondary | ICD-10-CM | POA: Diagnosis not present

## 2021-07-06 DIAGNOSIS — M25512 Pain in left shoulder: Secondary | ICD-10-CM | POA: Diagnosis not present

## 2021-07-06 DIAGNOSIS — I10 Essential (primary) hypertension: Secondary | ICD-10-CM | POA: Diagnosis not present

## 2021-07-06 DIAGNOSIS — Z299 Encounter for prophylactic measures, unspecified: Secondary | ICD-10-CM | POA: Diagnosis not present

## 2021-07-25 ENCOUNTER — Other Ambulatory Visit: Payer: Self-pay | Admitting: Cardiology

## 2021-07-25 ENCOUNTER — Other Ambulatory Visit: Payer: Self-pay | Admitting: Physician Assistant

## 2021-07-27 NOTE — Progress Notes (Deleted)
Office Visit Note  Patient: [name]             Date of Birth: 09/02/1950           MRN: [id number]             PCP: [name], MD Referring: [name], MD Visit Date: 08/10/2021 Occupation: '@GUAROCC'$ @  Subjective:  No chief complaint on file.   History of Present Illness: [name] is a 71 y.o. male ***   Activities of Daily Living:  Patient reports morning stiffness for *** {minute/hour:19697}.   Patient {ACTIONS;DENIES/REPORTS:21021675::"Denies"} nocturnal pain.  Difficulty dressing/grooming: {ACTIONS;DENIES/REPORTS:21021675::"Denies"} Difficulty climbing stairs: {ACTIONS;DENIES/REPORTS:21021675::"Denies"} Difficulty getting out of chair: {ACTIONS;DENIES/REPORTS:21021675::"Denies"} Difficulty using hands for taps, buttons, cutlery, and/or writing: {ACTIONS;DENIES/REPORTS:21021675::"Denies"}  No Rheumatology ROS completed.   PMFS History:  Patient Active Problem List   Diagnosis Date Noted  . Atheroscler nonbiologic bypass graft left leg w/intermit claudication ([place]) 05/15/2021  . Peripheral polyneuropathy 08/15/2020  . Ulnar neuropathy of right upper extremity 08/15/2020  . Foraminal stenosis of cervical region 07/20/2020  . Tremor 02/15/2019  . Cervical radiculitis 10/15/2018  . 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 Opioid dependence ([place]) 10/15/2018  . Other long term (current) drug therapy 10/15/2018  . Puncture wound of finger of left hand 08/08/2018  . Puncture wound of finger, right, complicated, initial encounter 08/05/2018  . DOE (dyspnea on exertion) 11/04/2017  . Muscle cramps 07/04/2017  . Contracture of left elbow 07/04/2017  . High risk medication use 06/23/2017  . Primary osteoarthritis of both hands 06/23/2017  . Primary osteoarthritis of both feet 06/23/2017  . 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 factor ([place]) 06/23/2017  . DDD (degenerative disc disease), cervical 06/23/2017  . Rotator cuff  syndrome of right shoulder 06/06/2017  . History of  bilateral carpal tunnel release 06/06/2017  . Ganglion cyst of wrist, left 06/06/2017  . Non-insulin dependent type 2 diabetes mellitus ([place]) 06/06/2017  . Chest pain with high risk for cardiac etiology 04/05/2017  . COPD with acute exacerbation ([place]) 03/13/2017  . Plantar fasciitis 02/21/2017  . Iliac artery stenosis, right ([place]) 02/12/2017  . Hematochezia 02/10/2017  . Abnormal CT of the abdomen 02/10/2017  . Cervical spondylosis 10/11/2016  . Bony growth 10/13/2015  . Pain in joint, lower leg 03/02/2014  . Discoloration of skin-Left dorsum foot 09/28/2013  . Swelling of limb-Left Calf / Leg 09/28/2013  . PVD (peripheral vascular disease) ([place]) 10/14/2012  . Pain in limb- Left popliteal and calf 10/14/2012  . Dyslipidemia, goal LDL below 70   . Essential hypertension 03/05/2012  . 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 Generalized osteoarthritis of multiple sites 02/06/2011  . History of arterial bypass of lower limb 12/25/2010  . Atherosclerosis of native arteries of extremity with intermittent claudication ([place]) 12/25/2010  . Coronary artery calcification seen on CT scan 04/16/2008  . COPD  GOLD II 04/16/2008  . GASTROESOPHAGEAL REFLUX DISEASE 04/16/2008  . Rheumatoid arthritis ([place]) 04/16/2008  . DIABETES MELLITUS, BORDERLINE 04/16/2008    Past Medical History:  Diagnosis Date  . Bronchitis   . Carotid artery disease ([place])   . Cervical spine disease    Neck surgery in 2007 with Dr. Joya Salm  . COPD (chronic obstructive pulmonary disease) ([place])   . Coronary atherosclerosis of native coronary artery    Nonobstrtuctive by catheterization 2007, 2013, 2021  . DVT (deep venous thrombosis) ([place])   . Essential hypertension   . GERD (gastroesophageal reflux disease)   .  Hyperlipidemia   . PAD (peripheral artery disease) ([place])    s/p left fem-pop bypass 2007; PTA and stenting of right iliac artery 09/2011  .  Rheumatoid arthritis(714.0)   . Type 2 diabetes mellitus ([facility])     Family History  Problem Relation Age of Onset  . Heart attack Mother 67  . Deep vein thrombosis Mother   . Diabetes Mother   . Hyperlipidemia Mother   . Hypertension Mother   . Peripheral vascular disease Mother   . Stroke Father   . Diabetes Father   . Hyperlipidemia Father   . Hypertension Father   . Peripheral vascular disease Father   . Diabetes Sister   . Hyperlipidemia Sister   . Hypertension Sister   . Diabetes Brother   . Hyperlipidemia Brother   . Cancer Brother   . Deep vein thrombosis Brother   . Hypertension Brother   . Peripheral vascular disease Brother   . Hyperlipidemia Son   . Cancer Son   . Hypertension Son   . Diabetes Daughter   . Colon cancer Neg Hx    Past Surgical History:  Procedure Laterality Date  . ABDOMINAL AORTAGRAM N/A 10/03/2011   Procedure: ABDOMINAL Maxcine Ham;  Surgeon: Conrad Homeland, MD;  Location: [facility] CATH LAB;  Service: Cardiovascular;  Laterality: N/A;  . ABDOMINAL AORTOGRAM W/LOWER EXTREMITY N/A 01/16/2017   Procedure: ABDOMINAL AORTOGRAM W/LOWER EXTREMITY;  Surgeon: Conrad [place], MD;  Location: [place] CV LAB;  Service: Cardiovascular;  Laterality: N/A;  . ABDOMINAL AORTOGRAM W/LOWER EXTREMITY Bilateral 08/04/2019   Procedure: ABDOMINAL AORTOGRAM W/LOWER EXTREMITY;  Surgeon: Marty Heck, MD;  Location: [place] CV LAB;  Service: Cardiovascular;  Laterality: Bilateral;  . ABDOMINAL AORTOGRAM W/LOWER EXTREMITY N/A 03/09/2020   Procedure: ABDOMINAL AORTOGRAM W/LOWER EXTREMITY;  Surgeon: Marty Heck, MD;  Location: [place] CV LAB;  Service: Cardiovascular;  Laterality: N/A;  . CERVICAL DISC SURGERY    . COLONOSCOPY WITH PROPOFOL N/A 02/17/2017   Procedure: COLONOSCOPY WITH PROPOFOL;  Surgeon: Daneil Dolin, MD;  [facility];  Service: Endoscopy;  Laterality: N/A;  2:45pm  . FEMORAL-POPLITEAL BYPASS GRAFT  11/06/2005  . ILIAC ARTERY  STENT  10-03-11   Right CIA stenting  . LEFT HEART CATH AND CORONARY ANGIOGRAPHY N/A 04/14/2019   Procedure: LEFT HEART CATH AND CORONARY ANGIOGRAPHY;  Surgeon: Troy Sine, MD;  Location: [place] [facility] LAB;  Service: Cardiovascular;  Laterality: N/A;  . PERIPHERAL VASCULAR BALLOON ANGIOPLASTY Right 01/16/2017   Procedure: PERIPHERAL VASCULAR BALLOON ANGIOPLASTY;  Surgeon: Conrad Fords, MD;  Location: [place] CV LAB;  Service: Cardiovascular;  Laterality: Right;  common iliac   . PERIPHERAL VASCULAR BALLOON ANGIOPLASTY  03/09/2020   Procedure: PERIPHERAL VASCULAR BALLOON ANGIOPLASTY;  Surgeon: Marty Heck, MD;  Location: [place] CV LAB;  Service: Cardiovascular;;  . PR VEIN BYPASS GRAFT,AORTO-FEM-POP  06-22-2010   Redo Left Fem-Pop  . Repair of left arm fracture    . 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 SURGERY SCROTAL / TESTICULAR     Social History   Social History Narrative   The patient lives in [place].    Immunization History  Administered Date(s) Administered  . Influenza, High Dose Seasonal PF 10/14/2016, 09/21/2018  . Influenza-Unspecified 11/08/2008, 12/10/2019  . Moderna Sars-Covid-2 Vaccination 02/19/2019, 03/22/2019, 11/23/2019  . PPD Test 09/22/2007  . Tdap 02/09/2001, 08/05/2018     Objective: Vital Signs: There were no vitals taken for this visit.   Physical  Exam   Musculoskeletal Exam: ***  CDAI Exam: CDAI Score: -- Patient Global: --; Provider Global: -- Swollen: --; Tender: -- Joint Exam 08/10/2021   No joint exam has been documented for this visit   There is currently no information documented on the homunculus. Go to the Rheumatology activity and complete the homunculus joint exam.  Investigation: No additional findings.  Imaging: No results found.  Recent Labs: Lab Results  Component Value Date   WBC 6.7 03/05/2021   HGB 12.3 (L) 03/05/2021   PLT 356 03/05/2021   NA 141 03/05/2021   K 4.3 03/05/2021   CL 105 03/05/2021   CO2  26 03/05/2021   GLUCOSE 79 03/05/2021   BUN 16 03/05/2021   CREATININE 0.87 03/05/2021   BILITOT 0.4 03/05/2021   ALKPHOS 79 01/25/2021   AST 16 03/05/2021   ALT 14 03/05/2021   PROT 7.7 03/05/2021   ALBUMIN 4.5 01/25/2021   CALCIUM 9.6 03/05/2021   GFRAA 83 01/27/2020   QFTBGOLDPLUS NEGATIVE 12/05/2020    Speciality Comments: Prior therapy includes: methotrexate ( d/c alcohol use), Arava (d/c patient preference), [place] (patient declined)  Procedures:  No procedures performed Allergies: Patient has no known allergies.   Assessment / Plan:     Visit Diagnoses: No diagnosis found.  Orders: No orders of the defined types were placed in this encounter.  No orders of the defined types were placed in this encounter.   Face-to-face time spent with patient was *** minutes. Greater than 50% of time was spent in counseling and coordination of care.  Follow-Up Instructions: No follow-ups on file.   Earnestine Mealing, CMA  Note - This record has been created using Editor, commissioning.  Chart creation errors have been sought, but may not always  have been located. Such creation errors do not reflect on  the standard of medical care.

## 2021-07-30 DIAGNOSIS — M4802 Spinal stenosis, cervical region: Secondary | ICD-10-CM | POA: Diagnosis not present

## 2021-07-30 DIAGNOSIS — M961 Postlaminectomy syndrome, not elsewhere classified: Secondary | ICD-10-CM | POA: Diagnosis not present

## 2021-08-06 DIAGNOSIS — I10 Essential (primary) hypertension: Secondary | ICD-10-CM | POA: Diagnosis not present

## 2021-08-06 DIAGNOSIS — E1165 Type 2 diabetes mellitus with hyperglycemia: Secondary | ICD-10-CM | POA: Diagnosis not present

## 2021-08-09 ENCOUNTER — Other Ambulatory Visit: Payer: Self-pay | Admitting: Physician Assistant

## 2021-08-10 ENCOUNTER — Ambulatory Visit: Payer: Medicare Other | Admitting: Rheumatology

## 2021-08-10 DIAGNOSIS — J449 Chronic obstructive pulmonary disease, unspecified: Secondary | ICD-10-CM

## 2021-08-10 DIAGNOSIS — M19071 Primary osteoarthritis, right ankle and foot: Secondary | ICD-10-CM

## 2021-08-10 DIAGNOSIS — M5030 Other cervical disc degeneration, unspecified cervical region: Secondary | ICD-10-CM

## 2021-08-10 DIAGNOSIS — I10 Essential (primary) hypertension: Secondary | ICD-10-CM

## 2021-08-10 DIAGNOSIS — M24521 Contracture, right elbow: Secondary | ICD-10-CM

## 2021-08-10 DIAGNOSIS — G8929 Other chronic pain: Secondary | ICD-10-CM

## 2021-08-10 DIAGNOSIS — M19041 Primary osteoarthritis, right hand: Secondary | ICD-10-CM

## 2021-08-10 DIAGNOSIS — M19072 Primary osteoarthritis, left ankle and foot: Secondary | ICD-10-CM

## 2021-08-10 DIAGNOSIS — E782 Mixed hyperlipidemia: Secondary | ICD-10-CM

## 2021-08-10 DIAGNOSIS — I2510 Atherosclerotic heart disease of native coronary artery without angina pectoris: Secondary | ICD-10-CM

## 2021-08-10 DIAGNOSIS — I739 Peripheral vascular disease, unspecified: Secondary | ICD-10-CM

## 2021-08-10 DIAGNOSIS — Z9889 Other specified postprocedural states: Secondary | ICD-10-CM

## 2021-08-10 DIAGNOSIS — M75101 Unspecified rotator cuff tear or rupture of right shoulder, not specified as traumatic: Secondary | ICD-10-CM

## 2021-08-10 DIAGNOSIS — M0609 Rheumatoid arthritis without rheumatoid factor, multiple sites: Secondary | ICD-10-CM

## 2021-08-10 DIAGNOSIS — M25562 Pain in left knee: Secondary | ICD-10-CM

## 2021-08-10 DIAGNOSIS — Z8639 Personal history of other endocrine, nutritional and metabolic disease: Secondary | ICD-10-CM

## 2021-08-10 DIAGNOSIS — Z79899 Other long term (current) drug therapy: Secondary | ICD-10-CM

## 2021-08-10 NOTE — Progress Notes (Signed)
Cardiology Office Note:   Date:  08/15/2021  NAME:  [name]    MRN: [id number] DOB:  [birth year]   PCP:  [name], Brett  Cardiologist:  Rozann Lesches, Brett  Electrophysiologist:  None   Referring Brett: [name], Brett   Chief Complaint  Patient presents with   Follow-up   History of Present Illness:   [name] is a 71 y.o. male with a hx of CAD, PAD, hypertension, hyperlipidemia, diabetes who presents for follow-up.  He reports for the last 3 to 4 months has had episodes of irregular heartbeat.  He reports he can feel his heart racing.  He was seen by his primary care physician in May EKG was reviewed by Dr. Domenic Polite which shows sinus tachycardia.  He was told to hold his metoprolol and pursue a heart monitor.  He presents today for further evaluation.  Heart rate is regular on exam.  Pulse in the 80s.  He did not start the metoprolol.  He is also had left foot pain.  He is status post left femoropopliteal bypass as well as multiple angioplasties.  He is being followed by vascular surgery.  Most recent ultrasounds show good waveforms.  He has poor pulses.  He has plans to follow with vascular surgery next month.  Blood pressure is well-controlled.  Does get dizzy at times.  Not drinking enough water.  He may drink 1 to 2 glasses/day.  We discussed drinking up to 6.  Overall denies chest pain or trouble breathing.  Seems to be stable.  Problem List Non-obstructive CAD  -30% LAD/RCA (04/14/2019) 2. PAD -L Fem-pop bypass 2007 -R iliac stent 2013 -R SFA angioplasty -R anterior tibial angioplasty  3. HTN 4. HLD 5. DM   Past Medical History: Past Medical History:  Diagnosis Date   Bronchitis    Carotid artery disease ([place])    Cervical spine disease    Neck surgery in 2007 with Dr. Joya Salm   COPD (chronic obstructive pulmonary disease) ([place])    Coronary atherosclerosis of native coronary artery    Nonobstrtuctive by catheterization 2007, 2013, 2021   DVT (deep venous  thrombosis) ([place])    Essential hypertension    GERD (gastroesophageal reflux disease)    Hyperlipidemia    PAD (peripheral artery disease) ([place])    s/p left fem-pop bypass 2007; PTA and stenting of right iliac artery 09/2011   Rheumatoid arthritis(714.0)    Type 2 diabetes mellitus ([place])     Past Surgical History: Past Surgical History:  Procedure Laterality Date   ABDOMINAL AORTAGRAM N/A 10/03/2011   Procedure: ABDOMINAL Maxcine Ham;  Surgeon: Conrad Bock, Brett;  Location: [facility] CATH LAB;  Service: Cardiovascular;  Laterality: N/A;   ABDOMINAL AORTOGRAM W/LOWER EXTREMITY N/A 01/16/2017   Procedure: ABDOMINAL AORTOGRAM W/LOWER EXTREMITY;  Surgeon: Conrad Evaro, Brett;  Location: [facility] CV LAB;  Service: Cardiovascular;  Laterality: N/A;   ABDOMINAL AORTOGRAM W/LOWER EXTREMITY Bilateral 08/04/2019   Procedure: ABDOMINAL AORTOGRAM W/LOWER EXTREMITY;  Surgeon: Marty Heck, Brett;  Location: [facility] CV LAB;  Service: Cardiovascular;  Laterality: Bilateral;   ABDOMINAL AORTOGRAM W/LOWER EXTREMITY N/A 03/09/2020   Procedure: ABDOMINAL AORTOGRAM W/LOWER EXTREMITY;  Surgeon: Marty Heck, Brett;  Location: [facility] CV LAB;  Service: Cardiovascular;  Laterality: N/A;   CERVICAL DISC SURGERY     COLONOSCOPY WITH PROPOFOL N/A 02/17/2017   Procedure: COLONOSCOPY WITH PROPOFOL;  Surgeon: Daneil Dolin, Brett;  [facility];  Service: Endoscopy;  Laterality:  N/A;  2:45pm   FEMORAL-POPLITEAL BYPASS GRAFT  11/06/2005   ILIAC ARTERY STENT  10-03-11   Right CIA stenting   LEFT HEART CATH AND CORONARY ANGIOGRAPHY N/A 04/14/2019   Procedure: LEFT HEART CATH AND CORONARY ANGIOGRAPHY;  Surgeon: Troy Sine, Brett;  Location: [facility] CV LAB;  Service: Cardiovascular;  Laterality: N/A;   PERIPHERAL VASCULAR BALLOON ANGIOPLASTY Right 01/16/2017   Procedure: PERIPHERAL VASCULAR BALLOON ANGIOPLASTY;  Surgeon: Conrad Greenwood, Brett;  Location: [facility] LAB;  Service: Cardiovascular;   Laterality: Right;  common iliac    PERIPHERAL VASCULAR BALLOON ANGIOPLASTY  03/09/2020   Procedure: PERIPHERAL VASCULAR BALLOON ANGIOPLASTY;  Surgeon: Marty Heck, Brett;  Location: [facility] LAB;  Service: Cardiovascular;;   PR VEIN BYPASS GRAFT,AORTO-FEM-POP  06-22-2010   Redo Left Fem-Pop   Repair of left arm fracture     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   atorvastatin (LIPITOR) 80 MG tablet Take 1 tablet (80 mg total) by mouth daily.   budesonide-formoterol (SYMBICORT) 160-4.5 MCG/ACT inhaler Take 2 puffs first thing in am and then another 2 puffs about 12 hours later.   cilostazol (PLETAL) 100 MG tablet TAKE 1 TABLET(100 MG) BY MOUTH TWICE DAILY   clopidogrel (PLAVIX) 75 MG tablet Take 1 tablet (75 mg total) by mouth daily.   colchicine 0.6 MG tablet Take 0.6 mg by mouth 2 (two) times daily.   Dexlansoprazole (DEXILANT) 30 MG capsule Take 30- 60 min before your first and last meals of the day (Patient taking differently: Take 30 mg by mouth daily.)   diclofenac Sodium (VOLTAREN) 1 % GEL Apply 2 gram  to painful foot and ankle joint twice daily.   glipiZIDE (GLUCOTROL) 5 MG tablet Take 5 mg by mouth daily.   HYDROcodone-acetaminophen (NORCO) 7.5-325 MG per tablet Take 1 tablet by mouth 2 (two) times daily as needed for moderate pain.   isosorbide dinitrate (ISORDIL) 30 MG tablet Take 30 mg by mouth daily.   JANUVIA 100 MG tablet Take 100 mg by mouth daily.   JARDIANCE 25 MG TABS tablet Take 25 mg by mouth daily.   ketoconazole (NIZORAL) 2 % cream Apply to both feet and between toes once daily for 6 weeks.   losartan (COZAAR) 25 MG tablet Take 12.5 mg by mouth daily.   metFORMIN (GLUCOPHAGE) 500 MG tablet Take 500 mg by mouth daily.   nitroGLYCERIN (NITROSTAT) 0.4 MG SL tablet Place 1 tablet (0.4 mg total) under the tongue  every 5 (five) minutes as needed for chest pain.   ONE TOUCH ULTRA TEST test strip    OneTouch Delica Lancets 53M MISC Apply topically.   PROAIR HFA 108 (90 BASE) MCG/ACT inhaler Inhale 2 puffs into the lungs every 6 (six) hours as needed for wheezing or shortness of breath.    sildenafil (REVATIO) 20 MG tablet Take 40-60 mg by mouth daily as needed for erectile dysfunction.   SPIRIVA RESPIMAT 2.5 MCG/ACT AERS    XELJANZ 5 MG TABS Take 1 tablet (5 mg total) by mouth daily.   [DISCONTINUED] losartan (COZAAR) 25 MG tablet TAKE 1 TABLET(25 MG) BY MOUTH DAILY     Allergies:    Patient has no known allergies.   Social History: Social History   Socioeconomic History   Marital status:  Single    Spouse name: Not on file   Number of children: Not on file   Years of education: Not on file   Highest education level: Not on file  Occupational History   Not on file  Tobacco Use   Smoking status: Former    Packs/day: 1.00    Years: 45.00    Total pack years: 45.00    Types: Cigarettes, Cigars    Quit date: 09/01/2011    Years since quitting: 9.9   Smokeless tobacco: Never  Vaping Use   Vaping Use: Never used  Substance and Sexual Activity   Alcohol use: Yes    Comment: 2-3 beers weekly   Drug use: No   Sexual activity: Not Currently    Birth control/protection: None  Other Topics Concern   Not on file  Social History Narrative   The patient lives in [place].    Social Determinants of Health   Financial Resource Strain: Not on file  Food Insecurity: Not on file  Transportation Needs: Not on file  Physical Activity: Not on file  Stress: Not on file  Social Connections: Not on file     Family History: The patient's family history includes Cancer in his brother and son; Deep vein thrombosis in his brother and mother; Diabetes in his brother, daughter, father, mother, and sister; Heart attack (age of onset: 52) in his mother; Hyperlipidemia in his brother, father, mother, sister,  and son; Hypertension in his brother, father, mother, sister, and son; Peripheral vascular disease in his brother, father, and mother; Stroke in his father. There is no history of Colon cancer.  ROS:   All other ROS reviewed and negative. Pertinent positives noted in the HPI.     EKGs/Labs/Other Studies Reviewed:   The following studies were personally reviewed by me today:   Recent Labs: 03/05/2021: ALT 14; BUN 16; Creat 0.87; Hemoglobin 12.3; Platelets 356; Potassium 4.3; Sodium 141   Recent Lipid Panel    Component Value Date/Time   CHOL 182 01/25/2021 1118   CHOL 160 11/09/2019 1032   TRIG 86 01/25/2021 1118   HDL 68 01/25/2021 1118   HDL 61 11/09/2019 1032   CHOLHDL 2.7 01/25/2021 1118   VLDL 17 01/25/2021 1118   LDLCALC 97 01/25/2021 1118   LDLCALC 71 05/05/2020 0000    Physical Exam:   VS:  BP 128/72   Pulse 86   Ht '5\' 9"'$  (1.753 m)   Wt 165 lb (74.8 kg)   SpO2 96%   BMI 24.37 kg/m    Wt Readings from Last 3 Encounters:  08/15/21 165 lb (74.8 kg)  05/17/21 166 lb 6.4 oz (75.5 kg)  05/15/21 165 lb (74.8 kg)    General: Well nourished, well developed, in no acute distress Head: Atraumatic, normal size  Eyes: PEERLA, EOMI  Neck: Supple, no JVD Endocrine: No thryomegaly Cardiac: Normal S1, S2; RRR; no murmurs, rubs, or gallops Lungs: Clear to auscultation bilaterally, no wheezing, rhonchi or rales  Abd: Soft, nontender, no hepatomegaly  Ext: Diminished pulses in the lower extremities Musculoskeletal: No deformities, BUE and BLE strength normal and equal Skin: Warm and dry, no rashes   Neuro: Alert and oriented to person, place, time, and situation, CNII-XII grossly intact, no focal deficits  Psych: Normal mood and affect   ASSESSMENT:   [name] is a 71 y.o. male who presents for the following: 1. Palpitations   2. PVD (peripheral vascular disease) ([place])   3. S/P femoral-popliteal bypass surgery  4. Coronary artery disease involving native coronary  artery of native heart with angina pectoris ([place])   5. Mixed hyperlipidemia     PLAN:   1. Palpitations -Reports irregular heartbeat for the past few months.  Occurs intermittently every 2 to 3 days.  Pulse is regular on exam today.  EKGs have demonstrated sinus tachycardia.  We will hold metoprolol and pursue a 7-day Zio patch to exclude arrhythmias.  Hold metoprolol until this is done.  2. PVD (peripheral vascular disease) ([place]) 3. S/P femoral-popliteal bypass surgery -Severe PAD status post multiple interventions.  Now with swelling in the lower extremity.  Mainly in the left leg.  Poor pulses.  Ultrasounds of the lower extremities were unremarkable in May.  We will have him follow-up with vascular surgery for further management.  He will continue current antiplatelet regimen as well as statin.  4. Coronary artery disease involving native coronary artery of native heart with angina pectoris ([facility]) -Nonobstructive CAD.  No symptoms of angina.  5. Mixed hyperlipidemia -Continue statin.      Disposition: Return in about 4 months (around 12/15/2021).  Medication Adjustments/Labs and Tests Ordered: Current medicines are reviewed at length with the patient today.  Concerns regarding medicines are outlined above.  Orders Placed This Encounter  Procedures   LONG TERM MONITOR (3-14 DAYS)   No orders of the defined types were placed in this encounter.   Patient Instructions  Medication Instructions:   HOLD Metoprolol until after you get your monitor results.    You can use the Diclofenac ointment given to you by Dr.Vyas    Labwork: None today  Testing/Procedures: ZIO XT- Long Term Monitor Instructions   Your physician has requested you wear your ZIO patch monitor____7___days.   This is a single patch monitor.  Ir[employer] supplies one patch monitor per enrollment.  Additional stickers are not available.   Do not shower for the first 24 hours.  You may shower after the first 24  hours.   Press button if you feel a symptom. You will hear a small click.  Record Date, Time and Symptom in the Patient Log Book.   When you are ready to remove patch, follow instructions on last 2 pages of Patient Log Book.  Stick patch monitor onto last page of Patient Log Book.   Place Patient Log Book in [place] box.  Use locking tab on box and tape box closed securely.  The Orange and [employer] has [employer] on it.  Please place in mailbox as soon as possible.  Your physician should have your test results approximately 7 days after the monitor has been mailed back to [facility].   Call [facility] at [phone] if you have questions regarding your ZIO XT patch monitor.  Call them immediately if you see an orange light blinking on your monitor.   If your monitor falls off in less than 4 days contact our Monitor department at [phone].  If your monitor becomes loose or falls off after 4 days call [employer] at [phone] for suggestions on securing your monitor.    Follow-Up: 4 months Dr.McDowell  Any Other Special Instructions Will Be Listed Below (If Applicable).   Drink 6 glasses of water a day to stay hydrated as you work outside.   If you need a refill on your cardiac medications before your next appointment, please call your pharmacy.    Time Spent with Patient: I have spent a total of 35 minutes with patient reviewing  hospital notes, telemetry, EKGs, labs and examining the patient as well as establishing an assessment and plan that was discussed with the patient.  > 50% of time was spent in direct patient care.  Signed, Addison Naegeli. Audie Box, Brett, [place]  [address] [place], [place] [address] [phone]  08/15/2021 2:25 PM

## 2021-08-14 ENCOUNTER — Ambulatory Visit (INDEPENDENT_AMBULATORY_CARE_PROVIDER_SITE_OTHER): Payer: Medicare Other

## 2021-08-14 ENCOUNTER — Ambulatory Visit (INDEPENDENT_AMBULATORY_CARE_PROVIDER_SITE_OTHER): Payer: Medicare Other | Admitting: Podiatry

## 2021-08-14 ENCOUNTER — Encounter: Payer: Self-pay | Admitting: Podiatry

## 2021-08-14 DIAGNOSIS — M05672 Rheumatoid arthritis of left ankle and foot with involvement of other organs and systems: Secondary | ICD-10-CM | POA: Diagnosis not present

## 2021-08-14 DIAGNOSIS — R609 Edema, unspecified: Secondary | ICD-10-CM

## 2021-08-14 DIAGNOSIS — M79675 Pain in left toe(s): Secondary | ICD-10-CM

## 2021-08-14 DIAGNOSIS — E1159 Type 2 diabetes mellitus with other circulatory complications: Secondary | ICD-10-CM | POA: Diagnosis not present

## 2021-08-14 DIAGNOSIS — M79674 Pain in right toe(s): Secondary | ICD-10-CM | POA: Diagnosis not present

## 2021-08-14 DIAGNOSIS — B353 Tinea pedis: Secondary | ICD-10-CM

## 2021-08-14 DIAGNOSIS — B351 Tinea unguium: Secondary | ICD-10-CM

## 2021-08-14 MED ORDER — DICLOFENAC SODIUM 1 % EX GEL: CUTANEOUS | 1 refills | Status: DC

## 2021-08-14 MED ORDER — KETOCONAZOLE 2 % EX CREA
TOPICAL_CREAM | CUTANEOUS | 1 refills | Status: DC
Start: 2021-08-14 — End: 2021-10-17

## 2021-08-14 NOTE — Progress Notes (Signed)
Subjective:  Patient ID: [name], male    DOB: 06-Feb-1950,  MRN: [id number]  [name] presents to clinic today for at risk foot care. Pt has h/o NIDDM with PAD and painful thick toenails that are difficult to trim. Pain interferes with ambulation. Aggravating factors include wearing enclosed shoe gear. Pain is relieved with periodic professional debridement.  Patient states blood glucose was 110 mg/dl today.  Last A1c was 7.5%.  New pedal problem(s): Patient relates left ankle pain which has been persistent for the past few months. He has seen Rheumatology for this and it was suspected gout. He is concerned about dark discoloration of the top of the left foot. Patient states he has been applying ice to the area and swelling has gone down some, but persists.     PCP is [name], MD , and last visit was  April 09, 2021.  He has PAD and is followed closely by Vascular. Last visit was 05/15/2021 and he was seen by Dr. Monica Martinez.  No Known Allergies  Review of Systems: Negative except as noted in the HPI.  Objective:  There were no vitals filed for this visit.  [name] is a pleasant 71 y.o. male in NAD. AAO X 3.  Vascular Examination: CFT <5 seconds b/l LE. Diminished pedal pulse(s) b/l LE.  No pain with calf compression b/l. Lower extremity skin temperature gradient warm to warm b/l. Trace edema noted LLE. No ischemia or gangrene noted b/l LE. No cyanosis or clubbing noted b/l LE.  Dermatological Examination: Pedal skin thin, shiny and atrophic b/l LE. No open wounds b/l LE. No interdigital macerations noted b/l LE. Toenails 1-5 bilaterally elongated, discolored, dystrophic, thickened, and crumbly with subungual debris and tenderness to dorsal palpation. No hyperkeratotic nor porokeratotic lesions present on today's visit. Diffuse scaling noted peripherally and plantarly b/l feet.  No interdigital macerations.  No blisters, no weeping. No signs of secondary  bacterial infection noted. No breaks in skin.   Musculoskeletal Examination: Muscle strength 5/5 to all lower extremity muscle groups bilaterally. HAV with bunion deformity noted b/l LE. Hammertoe deformity noted 2-5 b/l. He has no increased warmth nor abnormal coolness to the LLE. There is some postinflammatory hyperpigmentation.   Neurological Examination: Pt has subjective symptoms of neuropathy. Protective sensation intact 5/5 intact bilaterally with 10g monofilament b/l. Vibratory sensation intact b/l.  Xray findings left foot: Vessel calcifications noted left foot. Plantar calcaneal spur noted left foot. No evidence of fracture left foot/ankle. Decreased joint space noted left naviculocuneiform joint with dorsal osteophyte.  Assessment/Plan: 1. Pain due to onychomycosis of toenails of both feet   2. Tinea pedis of both feet   3. Swelling   4. 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 Type 2 diabetes mellitus with vascular disease ([place])     -Patient was evaluated and treated. All patient's and/or POA's questions/concerns answered on today's visit. -Examined patient. -Discussed arthridites as his symptoms may be a combination of osteoarthritis and RA. Start on Diclofenac Gel 1%. Apply 2 grams to area up to twice daily. CFT adequate and I suspect no ischemia as foot/ankle temperature are warm. Advised patient to discontinue icing as it may cause ischemia. He related understanding. Patient informed if foot becomes any darker to see Vascular or report to ED. -Patient to continue soft, supportive shoe gear daily. -Toenails 1-5 b/l were debrided in length and girth with sterile nail nippers and dremel without  iatrogenic bleeding.  -For tinea pedis, Rx sent to pharmacy for Ketoconazole Cream 2% to be applied once daily for six weeks. -Xray of left foot was performed and reviewed with patient and/or POA. -Patient/POA to call should  there be question/concern in the interim.   Return in about 2 months (around 10/17/2021).  Marzetta Board, DPM

## 2021-08-14 NOTE — Patient Instructions (Signed)
Athlete's Foot Athlete's foot (tinea pedis) is a fungal infection of the skin on your feet. It often occurs on the skin that is between or underneath the toes. It can also occur on the soles of your feet. The infection can spread from person to person (is contagious). It can also spread when a person's bare feet come in contact with the fungus on shower floors or on items such as shoes. What are the causes? This condition is caused by a fungus that grows in warm, moist places. You can get athlete's foot by sharing shoes, shower stalls, towels, and wet floors with someone who is infected. Not washing your feet or changing your socks often enough can also lead to athlete's foot. What increases the risk? This condition is more likely to develop in: Men. People who have a weak body defense system (immune system). People who have diabetes. People who use public showers, such as at a gym. People who wear heavy-duty shoes, such as industrial or military shoes. Seasons with warm, humid weather. What are the signs or symptoms? Symptoms of this condition include: Itchy areas between your toes or on the soles of your feet. White, flaky, or scaly areas between your toes or on the soles of your feet. Very itchy small blisters between your toes or on the soles of your feet. Small cuts in your skin. These cuts can become infected. Thick or discolored toenails. How is this diagnosed? This condition may be diagnosed with a physical exam and a review of your medical history. Your health care provider may also take a skin or toenail sample to examine under a microscope. How is this treated? This condition is treated with antifungal medicines. These may be applied as powders, ointments, or creams. In severe cases, an oral antifungal medicine may be given. Follow these instructions at home: Medicines Apply or take over-the-counter and prescription medicines only as told by your health care provider. Apply your  antifungal medicine as told by your health care provider. Do not stop using the antifungal even if your condition improves. Foot care Do not scratch your feet. Keep your feet dry: Wear cotton or wool socks. Change your socks every day or if they become wet. Wear shoes that allow air to flow, such as sandals or canvas tennis shoes. Wash and dry your feet, including the area between your toes. Also, wash and dry your feet: Every day or as told by your health care provider. After exercising. General instructions Do not let others use towels, shoes, nail clippers, or other personal items that touch your feet. Protect your feet by wearing sandals in wet areas, such as locker rooms and shared showers. Keep all follow-up visits. This is important. If you have diabetes, keep your blood sugar under control. Contact a health care provider if: You have a fever. You have swelling, soreness, warmth, or redness in your foot. Your feet are not getting better with treatment. Your symptoms get worse. You have new symptoms. You have severe pain. Summary Athlete's foot (tinea pedis) is a fungal infection of the skin on your feet. It often occurs on skin that is between or underneath the toes. This condition is caused by a fungus that grows in warm, moist places. Symptoms include white, flaky, or scaly areas between your toes or on the soles of your feet. This condition is treated with antifungal medicines. Keep your feet clean. Always dry them thoroughly. This information is not intended to replace advice given to you by   your health care provider. Make sure you discuss any questions you have with your health care provider. Document Revised: 04/16/2020 Document Reviewed: 04/16/2020 Elsevier Patient Education  2023 Elsevier Inc.  

## 2021-08-15 ENCOUNTER — Ambulatory Visit (INDEPENDENT_AMBULATORY_CARE_PROVIDER_SITE_OTHER): Payer: Medicare Other

## 2021-08-15 ENCOUNTER — Ambulatory Visit (INDEPENDENT_AMBULATORY_CARE_PROVIDER_SITE_OTHER): Payer: Medicare Other | Admitting: Cardiovascular Disease

## 2021-08-15 ENCOUNTER — Encounter: Payer: Self-pay | Admitting: Cardiovascular Disease

## 2021-08-15 VITALS — BP 128/72 | HR 86 | Ht 69.0 in | Wt 165.0 lb

## 2021-08-15 DIAGNOSIS — R002 Palpitations: Secondary | ICD-10-CM

## 2021-08-15 DIAGNOSIS — I25119 Atherosclerotic heart disease of native coronary artery with unspecified angina pectoris: Secondary | ICD-10-CM | POA: Diagnosis not present

## 2021-08-15 DIAGNOSIS — E782 Mixed hyperlipidemia: Secondary | ICD-10-CM | POA: Diagnosis not present

## 2021-08-15 DIAGNOSIS — Z95828 Presence of other vascular implants and grafts: Secondary | ICD-10-CM | POA: Diagnosis not present

## 2021-08-15 DIAGNOSIS — I739 Peripheral vascular disease, unspecified: Secondary | ICD-10-CM | POA: Diagnosis not present

## 2021-08-15 NOTE — Patient Instructions (Signed)
Medication Instructions:   HOLD Metoprolol until after you get your monitor results.    You can use the Diclofenac ointment given to you by Dr.Vyas    Labwork: None today  Testing/Procedures: ZIO XT- Long Term Monitor Instructions   Your physician has requested you wear your ZIO patch monitor____7___days.   This is a single patch monitor.  [employer] supplies one patch monitor per enrollment.  Additional stickers are not available.   Do not shower for the first 24 hours.  You may shower after the first 24 hours.   Press button if you feel a symptom. You will hear a small click.  Record Date, Time and Symptom in the Patient Log Book.   When you are ready to remove patch, follow instructions on last 2 pages of Patient Log Book.  Stick patch monitor onto last page of Patient Log Book.   Place Patient Log Book in [place] box.  Use locking tab on box and tape box closed securely.  The [employer] has [employer] on it.  Please place in mailbox as soon as possible.  Your physician should have your test results approximately 7 days after the monitor has been mailed back to [facility].   Call [place] at [phone] if you have questions regarding your ZIO XT patch monitor.  Call them immediately if you see an orange light blinking on your monitor.   If your monitor falls off in less than 4 days contact our Monitor department at [phone].  If your monitor becomes loose or falls off after 4 days call [employer] at [phone] for suggestions on securing your monitor.    Follow-Up: 4 months Dr.McDowell  Any Other Special Instructions Will Be Listed Below (If Applicable).   Drink 6 glasses of water a day to stay hydrated as you work outside.   If you need a refill on your cardiac medications before your next appointment, please call your pharmacy.

## 2021-08-16 ENCOUNTER — Telehealth: Payer: Self-pay

## 2021-08-16 DIAGNOSIS — I10 Essential (primary) hypertension: Secondary | ICD-10-CM | POA: Diagnosis not present

## 2021-08-16 DIAGNOSIS — Z299 Encounter for prophylactic measures, unspecified: Secondary | ICD-10-CM | POA: Diagnosis not present

## 2021-08-16 DIAGNOSIS — I700 Atherosclerosis of aorta: Secondary | ICD-10-CM | POA: Diagnosis not present

## 2021-08-16 DIAGNOSIS — I25119 Atherosclerotic heart disease of native coronary artery with unspecified angina pectoris: Secondary | ICD-10-CM | POA: Diagnosis not present

## 2021-08-16 NOTE — Telephone Encounter (Signed)
Pt called stating he needed an appt for his legs and requested a call back.

## 2021-08-17 ENCOUNTER — Telehealth: Payer: Self-pay

## 2021-08-17 NOTE — Telephone Encounter (Signed)
Spoke with pt and advised Dr. Elisha Ponder forwarded his recent ov note to our office. She felt like his pain is coming from OA and not from a vascular concern. I reiterated that based off of her exam she felt it is OA. I also advised pt that he should elevate at least 15 mins daily to alleviate the swelling. Pt voiced his understanding and requested a sooner appt if our office gets a cancellation to see him sooner.

## 2021-08-17 NOTE — Telephone Encounter (Signed)
LVMTCB

## 2021-08-30 DIAGNOSIS — E1165 Type 2 diabetes mellitus with hyperglycemia: Secondary | ICD-10-CM | POA: Diagnosis not present

## 2021-08-30 DIAGNOSIS — Z299 Encounter for prophylactic measures, unspecified: Secondary | ICD-10-CM | POA: Diagnosis not present

## 2021-08-30 DIAGNOSIS — I10 Essential (primary) hypertension: Secondary | ICD-10-CM | POA: Diagnosis not present

## 2021-08-30 DIAGNOSIS — Z6823 Body mass index (BMI) 23.0-23.9, adult: Secondary | ICD-10-CM | POA: Diagnosis not present

## 2021-08-30 DIAGNOSIS — Z713 Dietary counseling and surveillance: Secondary | ICD-10-CM | POA: Diagnosis not present

## 2021-09-05 DIAGNOSIS — E1165 Type 2 diabetes mellitus with hyperglycemia: Secondary | ICD-10-CM | POA: Diagnosis not present

## 2021-09-05 DIAGNOSIS — I10 Essential (primary) hypertension: Secondary | ICD-10-CM | POA: Diagnosis not present

## 2021-09-07 ENCOUNTER — Telehealth: Payer: Self-pay | Admitting: Cardiology

## 2021-09-07 NOTE — Telephone Encounter (Signed)
Pt c/o medication issue:  1. Name of Medication: atorvastatin (LIPITOR) 80 MG tablet  2. How are you currently taking this medication (dosage and times per day)? Not sure what dose he needs to take  3. Are you having a reaction (difficulty breathing--STAT)? no  4. What is your medication issue? Patient states he went to his PCP and they have the medication listed as 20 mg, but our office has 80 mg. He says he is not sure which to take.

## 2021-09-07 NOTE — Telephone Encounter (Signed)
Informed patient that atorvastatin was increased to 80 mg on 2/1 and that he was supposed to have lab work in 8 weeks to see where his cholesterol levels was but he did not have these labs done. Patient will have labs done by rheumatologist on 8/5. Instructed to stay on the 80 mg of atorvastatin until results are received by PCP and/or Dr. Domenic Polite. Verbalized understanding.

## 2021-09-07 NOTE — Progress Notes (Unsigned)
Office Visit Note  Patient: [name]             Date of Birth: 1950/04/08           MRN: [id number]             PCP: Brett Chroman, MD Referring: Brett Chroman, MD Visit Date: 09/11/2021 Occupation: '@GUAROCC'$ @  Subjective:  Swelling in left foot   History of Present Illness: [name] is a 71 y.o. male with history of seronegative rheumatoid arthritis and osteoarthritis.  He is taking Xeljanz 5 mg 1 tablet by mouth daily.  Patient reports that he had a gap in therapy for about 2 weeks but resumed Xeljanz 3 to 4 days ago.  He states that while off of Morrie Sheldon he experienced increased joint pain involving multiple joints including both shoulders, left wrist, and left foot.  He has been wearing a compression wrap on his left wrist and left foot.  He was evaluated by Dr. Elisha Ponder for left foot pain and swelling. The use of diclofenac gel was discussed but has not tried using it consistently.  He has also been under the care of Dr. Elisha Ponder, [facility] for management of tinea pedis.      Activities of Daily Living:  Patient reports morning stiffness for 5 minutes.   Patient Reports nocturnal pain.  Difficulty dressing/grooming: Reports Difficulty climbing stairs: Denies Difficulty getting out of chair: Reports Difficulty using hands for taps, buttons, cutlery, and/or writing: Reports  Review of Systems  Constitutional:  Positive for fatigue.  HENT:  Positive for mouth dryness. Negative for mouth sores.   Eyes:  Positive for dryness.  Respiratory:  Positive for shortness of breath.   Cardiovascular:  Negative for chest pain and palpitations.  Gastrointestinal:  Negative for blood in stool, constipation and diarrhea.  Endocrine: Negative for increased urination.  Genitourinary:  Negative for involuntary urination.  Musculoskeletal:  Positive for joint pain, joint pain, joint swelling and morning stiffness. Negative for gait problem, myalgias, muscle weakness, muscle tenderness and  myalgias.  Skin:  Negative for color change, rash and sensitivity to sunlight.  Allergic/Immunologic: Negative for susceptible to infections.  Neurological:  Positive for dizziness. Negative for headaches.  Hematological:  Negative for swollen glands.  Psychiatric/Behavioral:  Positive for sleep disturbance. Negative for depressed mood. The patient is not nervous/anxious.     PMFS History:  Patient Active Problem List   Diagnosis Date Noted  . Atheroscler nonbiologic bypass graft left leg w/intermit claudication ([place]) 05/15/2021  . Peripheral polyneuropathy 08/15/2020  . Ulnar neuropathy of right upper extremity 08/15/2020  . Foraminal stenosis of cervical region 07/20/2020  . Tremor 02/15/2019  . Cervical radiculitis 10/15/2018  . 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 Opioid dependence ([place]) 10/15/2018  . Other long term (current) drug therapy 10/15/2018  . Puncture wound of finger of left hand 08/08/2018  . Puncture wound of finger, right, complicated, initial encounter 08/05/2018  . DOE (dyspnea on exertion) 11/04/2017  . Muscle cramps 07/04/2017  . Contracture of left elbow 07/04/2017  . High risk medication use 06/23/2017  . Primary osteoarthritis of both hands 06/23/2017  . Primary osteoarthritis of both feet 06/23/2017  . 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 factor ([place]) 06/23/2017  . DDD (degenerative disc disease), cervical 06/23/2017  . Rotator cuff syndrome of right shoulder 06/06/2017  . History of  bilateral carpal tunnel release 06/06/2017  . Ganglion cyst of wrist, left 06/06/2017  . Non-insulin dependent type 2  diabetes mellitus ([place]) 06/06/2017  . Chest pain with high risk for cardiac etiology 04/05/2017  . COPD with acute exacerbation ([place]) 03/13/2017  . Plantar fasciitis 02/21/2017  . Iliac artery stenosis, right ([place]) 02/12/2017  . Hematochezia 02/10/2017  . Abnormal CT of the abdomen 02/10/2017  . Cervical spondylosis  10/11/2016  . Bony growth 10/13/2015  . Pain in joint, lower leg 03/02/2014  . Discoloration of skin-Left dorsum foot 09/28/2013  . Swelling of limb-Left Calf / Leg 09/28/2013  . PVD (peripheral vascular disease) ([place]) 10/14/2012  . Pain in limb- Left popliteal and calf 10/14/2012  . Dyslipidemia, goal LDL below 70   . Essential hypertension 03/05/2012  . 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 Generalized osteoarthritis of multiple sites 02/06/2011  . History of arterial bypass of lower limb 12/25/2010  . Atherosclerosis of native arteries of extremity with intermittent claudication ([place]) 12/25/2010  . Coronary artery calcification seen on CT scan 04/16/2008  . COPD  GOLD II 04/16/2008  . GASTROESOPHAGEAL REFLUX DISEASE 04/16/2008  . Rheumatoid arthritis ([place]) 04/16/2008  . DIABETES MELLITUS, BORDERLINE 04/16/2008    Past Medical History:  Diagnosis Date  . Bronchitis   . Carotid artery disease ([place])   . Cervical spine disease    Neck surgery in 2007 with Dr. Joya Salm  . COPD (chronic obstructive pulmonary disease) ([place])   . Coronary atherosclerosis of native coronary artery    Nonobstrtuctive by catheterization 2007, 2013, 2021  . DVT (deep venous thrombosis) ([place])   . Essential hypertension   . GERD (gastroesophageal reflux disease)   . Hyperlipidemia   . PAD (peripheral artery disease) ([place])    s/p left fem-pop bypass 2007; PTA and stenting of right iliac artery 09/2011  . Rheumatoid arthritis(714.0)   . Type 2 diabetes mellitus (H[facility])     Family History  Problem Relation Age of Onset  . Heart attack Mother 32  . Deep vein thrombosis Mother   . Diabetes Mother   . Hyperlipidemia Mother   . Hypertension Mother   . Peripheral vascular disease Mother   . Stroke Father   . Diabetes Father   . Hyperlipidemia Father   . Hypertension Father   . Peripheral vascular disease Father   . Diabetes Sister   . Hyperlipidemia Sister   . Hypertension Sister   .  Diabetes Brother   . Hyperlipidemia Brother   . Cancer Brother   . Deep vein thrombosis Brother   . Hypertension Brother   . Peripheral vascular disease Brother   . Hyperlipidemia Son   . Cancer Son   . Hypertension Son   . Diabetes Daughter   . Colon cancer Neg Hx    Past Surgical History:  Procedure Laterality Date  . ABDOMINAL AORTAGRAM N/A 10/03/2011   Procedure: ABDOMINAL Maxcine Ham;  Surgeon: Conrad Muddy, MD;  Location: [facility] CATH LAB;  Service: Cardiovascular;  Laterality: N/A;  . ABDOMINAL AORTOGRAM W/LOWER EXTREMITY N/A 01/16/2017   Procedure: ABDOMINAL AORTOGRAM W/LOWER EXTREMITY;  Surgeon: Conrad Blanco, MD;  Location: [facility] CV LAB;  Service: Cardiovascular;  Laterality: N/A;  . ABDOMINAL AORTOGRAM W/LOWER EXTREMITY Bilateral 08/04/2019   Procedure: ABDOMINAL AORTOGRAM W/LOWER EXTREMITY;  Surgeon: Marty Heck, MD;  Location: [facility] CV LAB;  Service: Cardiovascular;  Laterality: Bilateral;  . ABDOMINAL AORTOGRAM W/LOWER EXTREMITY N/A 03/09/2020   Procedure: ABDOMINAL AORTOGRAM W/LOWER EXTREMITY;  Surgeon: Marty Heck, MD;  Location: [facility] CV LAB;  Service: Cardiovascular;  Laterality: N/A;  . CERVICAL DISC SURGERY    .  COLONOSCOPY WITH PROPOFOL N/A 02/17/2017   Procedure: COLONOSCOPY WITH PROPOFOL;  Surgeon: Daneil Dolin, MD;  Location: [facility]O SU[facility];  Service: Endoscopy;  Laterality: N/A;  2:45pm  . FEMORAL-POPLITEAL BYPASS GRAFT  11/06/2005  . ILIAC ARTERY STENT  10-03-11   Right CIA stenting  . LEFT HEART CATH AND CORONARY ANGIOGRAPHY N/A 04/14/2019   Procedure: LEFT HEART CATH AND CORONARY ANGIOGRAPHY;  Surgeon: Troy Sine, MD;  Location: [facility] CV LAB;  Service: Cardiovascular;  Laterality: N/A;  . PERIPHERAL VASCULAR BALLOON ANGIOPLASTY Right 01/16/2017   Procedure: PERIPHERAL VASCULAR BALLOON ANGIOPLASTY;  Surgeon: Conrad East Bernard, MD;  Location: [facility] CV LAB;  Service: Cardiovascular;  Laterality: Right;  common iliac   .  PERIPHERAL VASCULAR BALLOON ANGIOPLASTY  03/09/2020   Procedure: PERIPHERAL VASCULAR BALLOON ANGIOPLASTY;  Surgeon: Marty Heck, MD;  Location: [facility] CV LAB;  Service: Cardiovascular;;  . PR VEIN BYPASS GRAFT,AORTO-FEM-POP  06-22-2010   Redo Left Fem-Pop  . Repair of left arm fracture    . 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 SURGERY SCROTAL / TESTICULAR     Social History   Social History Narrative   The patient lives in [place].    Immunization History  Administered Date(s) Administered  . Influenza, High Dose Seasonal PF 10/14/2016, 09/21/2018  . Influenza-Unspecified 11/08/2008, 12/10/2019  . Moderna Sars-Covid-2 Vaccination 02/19/2019, 03/22/2019, 11/23/2019  . PPD Test 09/22/2007  . Tdap 02/09/2001, 08/05/2018     Objective: Vital Signs: BP 96/66 (BP Location: Left Arm, Patient Position: Sitting, Cuff Size: Normal)   Pulse 97   Resp 18   Ht '5\' 9"'$  (1.753 m)   Wt 166 lb 9.6 oz (75.6 kg)   BMI 24.60 kg/m    Physical Exam Vitals and nursing note reviewed.  Constitutional:      Appearance: He is well-developed.  HENT:     Head: Normocephalic and atraumatic.  Eyes:     Conjunctiva/sclera: Conjunctivae normal.     Pupils: Pupils are equal, round, and reactive to light.  Cardiovascular:     Rate and Rhythm: Normal rate and regular rhythm.     Heart sounds: Normal heart sounds.  Pulmonary:     Effort: Pulmonary effort is normal.     Breath sounds: Normal breath sounds.  Abdominal:     General: Bowel sounds are normal.     Palpations: Abdomen is soft.  Musculoskeletal:     Cervical back: Normal range of motion and neck supple.  Skin:    General: Skin is warm and dry.     Capillary Refill: Capillary refill takes less than 2 seconds.  Neurological:     Mental Status: He is alert and oriented to person, place, and time.  Psychiatric:        Behavior: Behavior normal.     Musculoskeletal Exam: C-spine has painful range of motion.  Left shoulder has limited  range of motion especially with internal rotation.  Bilateral elbow joint contractures noted.  Limited range of motion of both wrist joints especially with extension of the left wrist.  Synovial thickening of both wrist joints and over MCP joints.  No tenderness or synovitis over MCP joints noted.  Limited extension of PIP joints.  Hip joints have limited range of motion.  Knee joints have good range of motion with no warmth or effusion.  Ankle joints have good range of motion with some tenderness over the left ankle.  Some tenderness on the dorsal aspect of the left  foot.  No tenderness or synovitis over MTP joints.  No evidence of Achilles tendinitis or planter fasciitis.  Hammertoes noted: bilateral 2nd-5th.  Postinflammatory hyperpigmentation noted on dorsal aspect of feet.   CDAI Exam: CDAI Score: 1  Patient Global: 5 mm; Provider Global: 5 mm Swollen: 0 ; Tender: 0  Joint Exam 09/11/2021   No joint exam has been documented for this visit   There is currently no information documented on the homunculus. Go to the Rheumatology activity and complete the homunculus joint exam.  Investigation: No additional findings.  Imaging: DG Foot Complete Left  Result Date: 08/14/2021 Please see detailed radiograph report in office note.   Recent Labs: Lab Results  Component Value Date   WBC 6.7 03/05/2021   HGB 12.3 (L) 03/05/2021   PLT 356 03/05/2021   NA 141 03/05/2021   K 4.3 03/05/2021   CL 105 03/05/2021   CO2 26 03/05/2021   GLUCOSE 79 03/05/2021   BUN 16 03/05/2021   CREATININE 0.87 03/05/2021   BILITOT 0.4 03/05/2021   ALKPHOS 79 01/25/2021   AST 16 03/05/2021   ALT 14 03/05/2021   PROT 7.7 03/05/2021   ALBUMIN 4.5 01/25/2021   CALCIUM 9.6 03/05/2021   GFRAA 83 01/27/2020   QFTBGOLDPLUS NEGATIVE 12/05/2020    Speciality Comments: Prior therapy includes: methotrexate ( d/c alcohol use), Arava (d/c patient preference), Orenica (patient declined)  Procedures:  No procedures  performed Allergies: Patient has no known allergies.    Assessment / Plan:     Visit Diagnoses: 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 factor ([place]) -He presents today with increased pain in both shoulders, left wrist, and left ankle and foot.  He had a 2-week gap in therapy while taking Xeljanz at which time he experienced increased arthralgias, joint stiffness, and swelling involving multiple joints.  He has been back on xeljanz for 3-4 days and his symptoms have started to gradually improve.  He continues to wear a compression sleeve on his left wrist and has been wearing one on his left ankle and foot at times.  He has been under the care of his podiatrist Dr. Elisha Ponder.  In regards to his left foot pain there has been concern about possible gout.  Uric acid and sed rate will be checked today for further evaluation.  Overall he continues to find [place] to be effective at managing his rheumatoid arthritis as long as he is able to take Morrie Sheldon consistently.  A refill of Morrie Sheldon was sent to the pharmacy today.  He was advised to notify us if his joint pain, stiffness, and/or inflammation persists or worsens.  He will follow-up in the office or 3 months or sooner if needed.  plan: Sedimentation rate  High risk medication use - Xeljanz 5 mg 1 tablet by mouth daily.  Methotrexate discontinued due to alcohol use, Orencia declined by patient, treated with Morrie Sheldon initially at [facility]: COMPLETE METABOLIC PANEL WITH GFR, CBC with Differential/Platelet, Lipid panel, QuantiFERON-TB Gold Plus CBC and CMP were drawn on 03/05/2021.  He is overdue to update lab work.  Orders for CBC and CMP were released.  His next lab work will be due in December and every 3 months to monitor for drug toxicity.  Standing orders for CBC and CMP remain in place. Patient requested lab results to be forwarded to his cardiologist and primary care once resulted. TB Gold negative on 12/05/2020.  Future order for  TB Gold placed today. Lipid panel updated on  01/25/2021.  Orders for lipid panel released today. FDA BLACK BOX WARNING for major adverse cardiovascular events (MACE), thrombosis, mortality (including sudden cardiovascular death), serious infections, and lymphomas. MACE is defined as cardiovascular death, myocardial infarction, and stroke was discussed today in detail.  Thrombosis includes deep venous thrombosis (DVT), pulmonary embolism (PE), and arterial thrombosis. Discussed the importance of holding Morrie Sheldon if he develops signs or symptoms of an infection and to resume once the infection has completely cleared.  Discussed that he should hold Morrie Sheldon if he is taking antibiotics, antivirals, or antifungals until the infection has completely resolved.  Rotator cuff syndrome of right shoulder: He has good ROM of the right shoulder with no discomfort.    Contracture of joint of both elbows: Unchanged.  No tenderness or inflammation noted.  Primary osteoarthritis of both hands: He has PIP and DIP thickening consistent with osteoarthritis of both hands.  He continues to have chronic pain and stiffness in the left wrist joint.  No synovitis was noted on examination today.  He has been wearing a compression wrap on his left wrist for support and comfort.  History of  bilateral carpal tunnel release: Asymptomatic at this time.  Chronic pain of left knee: He has good range of motion of the left knee joint on examination today.  No warmth or effusion was noted.  Primary osteoarthritis of both feet: Has been experiencing increased pain and stiffness in his left ankle and midfoot.  He has been under the care of his podiatrist Dr. Elisha Ponder.  The use of Voltaren gel was recommended to apply topically as needed for pain relief.  He has not tried using Voltaren gel consistently yet.  Some mild diffuse swelling was noted on the dorsal aspect of his left foot but no warmth or synovitis of the left ankle joint was  noted.  Pain in left ankle and joints of left foot -He has been experiencing intermittent pain and stiffness in the left ankle and midfoot.  No history of gout.  He has noticed diffuse swelling and discoloration of the left foot.  No injury prior to the onset of symptoms.   He has been under the care of podiatry and vascular specialists.  Office visit note from 08/14/2021 with Dr. Elisha Ponder was reviewed today in the office.  He had x-rays of the left foot which did not reveal any evidence of a fracture.  He had decreased joint space in the left naviculocuneiform joint with dorsal osteophyte.   No ankle joint synovitis noted.  No tenderness or synovitis of MTP joints. The use of Voltaren gel was discussed.   Uric acid and sed rate were also checked today for further evaluation.  Plan: Sedimentation rate, Uric acid  DDD (degenerative disc disease), cervical: C-spine has good range of motion with no discomfort at this time.  No symptoms of radiculopathy currently.  Mixed hyperlipidemia -Lipid panel will be checked today since the patient is fasting.  Plan: Lipid panel  Essential hypertension: Blood pressure was 96/66 today in the office.  Discussed the importance of close blood pressure monitoring while taking Morrie Sheldon.  Other medical conditions are listed as follows:  History of diabetes mellitus  COPD  GOLD II  Atherosclerosis of native coronary artery of native heart without angina pectoris  PVD (peripheral vascular disease) ([place])    Orders: Orders Placed This Encounter  Procedures  . COMPLETE METABOLIC PANEL WITH GFR  . CBC with Differential/Platelet  . Lipid panel  . QuantiFERON-TB Gold Plus  .  Sedimentation rate  . Uric acid   Meds ordered this encounter  Medications  . XELJANZ 5 MG TABS    Sig: Take 1 tablet (5 mg total) by mouth daily.    Dispense:  90 tablet    Refill:  0     Follow-Up Instructions: Return in 3 months (on 12/11/2021) for Rheumatoid arthritis,  Osteoarthritis.   Ofilia Neas, PA-C  Note - This record has been created using Dragon software.  Chart creation errors have been sought, but may not always  have been located. Such creation errors do not reflect on  the standard of medical care.

## 2021-09-11 ENCOUNTER — Encounter: Payer: Self-pay | Admitting: Physician Assistant

## 2021-09-11 ENCOUNTER — Ambulatory Visit: Payer: Medicare Other | Attending: Physician Assistant | Admitting: Physician Assistant

## 2021-09-11 ENCOUNTER — Telehealth: Payer: Self-pay

## 2021-09-11 VITALS — BP 96/66 | HR 97 | Resp 18 | Ht 69.0 in | Wt 166.6 lb

## 2021-09-11 DIAGNOSIS — M19071 Primary osteoarthritis, right ankle and foot: Secondary | ICD-10-CM

## 2021-09-11 DIAGNOSIS — M24521 Contracture, right elbow: Secondary | ICD-10-CM

## 2021-09-11 DIAGNOSIS — I10 Essential (primary) hypertension: Secondary | ICD-10-CM | POA: Diagnosis not present

## 2021-09-11 DIAGNOSIS — E782 Mixed hyperlipidemia: Secondary | ICD-10-CM

## 2021-09-11 DIAGNOSIS — M5030 Other cervical disc degeneration, unspecified cervical region: Secondary | ICD-10-CM

## 2021-09-11 DIAGNOSIS — M19041 Primary osteoarthritis, right hand: Secondary | ICD-10-CM

## 2021-09-11 DIAGNOSIS — M75101 Unspecified rotator cuff tear or rupture of right shoulder, not specified as traumatic: Secondary | ICD-10-CM | POA: Diagnosis not present

## 2021-09-11 DIAGNOSIS — M0609 Rheumatoid arthritis without rheumatoid factor, multiple sites: Secondary | ICD-10-CM | POA: Diagnosis not present

## 2021-09-11 DIAGNOSIS — G8929 Other chronic pain: Secondary | ICD-10-CM

## 2021-09-11 DIAGNOSIS — Z9889 Other specified postprocedural states: Secondary | ICD-10-CM

## 2021-09-11 DIAGNOSIS — I739 Peripheral vascular disease, unspecified: Secondary | ICD-10-CM

## 2021-09-11 DIAGNOSIS — M25562 Pain in left knee: Secondary | ICD-10-CM

## 2021-09-11 DIAGNOSIS — Z79899 Other long term (current) drug therapy: Secondary | ICD-10-CM | POA: Diagnosis not present

## 2021-09-11 DIAGNOSIS — I2510 Atherosclerotic heart disease of native coronary artery without angina pectoris: Secondary | ICD-10-CM

## 2021-09-11 DIAGNOSIS — M25572 Pain in left ankle and joints of left foot: Secondary | ICD-10-CM | POA: Diagnosis not present

## 2021-09-11 DIAGNOSIS — M24522 Contracture, left elbow: Secondary | ICD-10-CM

## 2021-09-11 DIAGNOSIS — Z8639 Personal history of other endocrine, nutritional and metabolic disease: Secondary | ICD-10-CM | POA: Diagnosis not present

## 2021-09-11 DIAGNOSIS — J449 Chronic obstructive pulmonary disease, unspecified: Secondary | ICD-10-CM

## 2021-09-11 DIAGNOSIS — M19072 Primary osteoarthritis, left ankle and foot: Secondary | ICD-10-CM

## 2021-09-11 DIAGNOSIS — M19042 Primary osteoarthritis, left hand: Secondary | ICD-10-CM

## 2021-09-11 MED ORDER — XELJANZ 5 MG PO TABS
1.0000 | ORAL_TABLET | Freq: Every day | ORAL | 0 refills | Status: DC
Start: 2021-09-11 — End: 2022-02-05

## 2021-09-11 NOTE — Patient Instructions (Signed)
Standing Labs We placed an order today for your standing lab work.   Please have your standing labs drawn in December and every 3 months   If possible, please have your labs drawn 2 weeks prior to your appointment so that the provider can discuss your results at your appointment.  Please note that you may see your imaging and lab results in [place] before we have reviewed them. We may be awaiting multiple results to interpret others before contacting you. Please allow our office up to 72 hours to thoroughly review all of the results before contacting the office for clarification of your results.  We currently have open lab daily: Monday through Thursday from 1:30 PM-4:30 PM and Friday from 1:30 PM- 4:00 PM If possible, please come for your lab work on Monday, Thursday or Friday afternoons, as you may experience shorter wait times.   Effective November 07, 2021 the new lab hours will change to: Monday through Thursday from 1:30 PM-5:00 PM and Friday from 8:30 AM-12:00 PM If possible, please come for your lab work on Monday and Thursday afternoons, as you may experience shorter wait times.  Please be advised, all patients with office appointments requiring lab work will take precedent over walk-in lab work.    The office is located at [address], [place], [place], [place] [address] No appointment is necessary.   Labs are drawn by [facility]. Please bring your co-pay at the time of your lab draw.  You may receive a bill from [facility] for your lab work.  Please note if you are on Hydroxychloroquine and and an order has been placed for a Hydroxychloroquine level, you will need to have it drawn 4 hours or more after your last dose.  If you wish to have your labs drawn at another location, please call the office 24 hours in advance to send orders.  If you have any questions regarding directions or hours of operation,  please call [phone].   As a reminder, please drink plenty of water prior  to coming for your lab work. Thanks!  Because you are taking Morrie Sheldon, Rinvoq, or Olumiant, it is very important to know that this class of medications has a FDA BLACK BOX WARNING for major adverse cardiovascular events (MACE), thrombosis, mortality (including sudden cardiovascular death), serious infections, and lymphomas. MACE is defined as cardiovascular death, myocardial infarction, and stroke. Thrombosis includes deep venous thrombosis (DVT), pulmonary embolism (PE), and arterial thrombosis. If you are a current or former smoker, you are at higher risk for MACE.   If you have signs or symptoms of an infection or start antibiotics: First, call your PCP for workup of your infection. Hold your medication through the infection, until you complete your antibiotics, and until symptoms resolve if you take the following: Injectable medication (Actemra, Benlysta, Cimzia, Cosentyx, Enbrel, Humira, Kevzara, Orencia, Remicade, Simponi, Stelara, Taltz, Tremfya) Methotrexate Leflunomide (Arava) Mycophenolate (Cellcept) Morrie Sheldon, Olumiant, or Rinvoq  Vaccines You are taking a medication(s) that can suppress your immune system.  The following immunizations are recommended: Flu annually Covid-19  Td/Tdap (tetanus, diphtheria, pertussis) every 10 years Pneumonia (Prevnar 15 then Pneumovax 23 at least 1 year apart.  Alternatively, can take Prevnar 20 without needing additional dose) Shingrix: 2 doses from 4 weeks to 6 months apart  Please check with your PCP to make sure you are up to date.

## 2021-09-11 NOTE — Telephone Encounter (Signed)
Patient called stating that [place] and will not be able to wear the monitor until after he returns.

## 2021-09-11 NOTE — Telephone Encounter (Signed)
Received e-mail from [facility] that box was returned empty. Patient confirms he had placed monitor in box and had applied blue sticker to the end of box. He agrees to wear monitor again. I have e-mailed [employer] to ship another monitor to patient.

## 2021-09-12 DIAGNOSIS — Z299 Encounter for prophylactic measures, unspecified: Secondary | ICD-10-CM | POA: Diagnosis not present

## 2021-09-12 DIAGNOSIS — I10 Essential (primary) hypertension: Secondary | ICD-10-CM | POA: Diagnosis not present

## 2021-09-12 DIAGNOSIS — M25511 Pain in right shoulder: Secondary | ICD-10-CM | POA: Diagnosis not present

## 2021-09-12 LAB — LIPID PANEL
Cholesterol: 146 mg/dL (ref <200)
HDL: 57 mg/dL (ref >=40)
LDL Cholesterol (Calc): 75 mg/dL (calc)
Non-HDL Cholesterol (Calc): 89 mg/dL (calc) (ref <130)
Total CHOL/HDL Ratio: 2.6 (calc) (ref <5.0)
Triglycerides: 56 mg/dL (ref <150)

## 2021-09-12 LAB — COMPLETE METABOLIC PANEL WITH GFR
AG Ratio: 1.4 (calc) (ref 1.0–2.5)
ALT: 12 U/L (ref 9–46)
AST: 17 U/L (ref 10–35)
Albumin: 4.3 g/dL (ref 3.6–5.1)
Alkaline phosphatase (APISO): 76 U/L (ref 35–144)
BUN: 17 mg/dL (ref 7–25)
CO2: 26 mmol/L (ref 20–32)
Calcium: 9.3 mg/dL (ref 8.6–10.3)
Chloride: 106 mmol/L (ref 98–110)
Creat: 1.07 mg/dL (ref 0.70–1.28)
Globulin: 3.1 g/dL (calc) (ref 1.9–3.7)
Glucose, Bld: 80 mg/dL (ref 65–99)
Potassium: 4.5 mmol/L (ref 3.5–5.3)
Sodium: 141 mmol/L (ref 135–146)
Total Bilirubin: 0.4 mg/dL (ref 0.2–1.2)
Total Protein: 7.4 g/dL (ref 6.1–8.1)
eGFR: 75 mL/min/{1.73_m2} (ref >=60)

## 2021-09-12 LAB — CBC WITH DIFFERENTIAL/PLATELET
Absolute Monocytes: 537 cells/uL (ref 200–950)
Basophils Absolute: 32 cells/uL (ref 0–200)
Basophils Relative: 0.4 %
Eosinophils Absolute: 79 cells/uL (ref 15–500)
Eosinophils Relative: 1 %
HCT: 31.6 % — ABNORMAL LOW (ref 38.5–50.0)
Hemoglobin: 9.3 g/dL — ABNORMAL LOW (ref 13.2–17.1)
Lymphs Abs: 1928 cells/uL (ref 850–3900)
MCH: 23.7 pg — ABNORMAL LOW (ref 27.0–33.0)
MCHC: 29.4 g/dL — ABNORMAL LOW (ref 32.0–36.0)
MCV: 80.4 fL (ref 80.0–100.0)
MPV: 9.6 fL (ref 7.5–12.5)
Monocytes Relative: 6.8 %
Neutro Abs: 5325 cells/uL (ref 1500–7800)
Neutrophils Relative %: 67.4 %
Platelets: 432 10*3/uL — ABNORMAL HIGH (ref 140–400)
RBC: 3.93 10*6/uL — ABNORMAL LOW (ref 4.20–5.80)
RDW: 15.8 % — ABNORMAL HIGH (ref 11.0–15.0)
Total Lymphocyte: 24.4 %
WBC: 7.9 10*3/uL (ref 3.8–10.8)

## 2021-09-12 LAB — SEDIMENTATION RATE: Sed Rate: 11 mm/h (ref 0–20)

## 2021-09-12 LAB — URIC ACID: Uric Acid, Serum: 3.8 mg/dL — ABNORMAL LOW (ref 4.0–8.0)

## 2021-09-12 NOTE — Progress Notes (Signed)
CMP WNL.  Lipid panel WNL.  ESR WNL.  Uric acid WNL.  Anemia has worsened-hgb has dropped to 9.3.  please notify the patient and forward results to PCP.  Plt count is elevated-likely due to chronic inflammation.

## 2021-09-24 NOTE — Progress Notes (Deleted)
Cardiology Office Note  Date: 09/24/2021   ID: [name], DOB [birth year], MRN [id number]  PCP:  [name], MD  Cardiologist:  Rozann Lesches, MD Electrophysiologist:  None   No chief complaint on file.   History of Present Illness: [name] is a 71 y.o. male last seen in August by Dr. Audie Box, I reviewed the note.  He was scheduled for a Zio patch at that time for further evaluation of palpitations.  Per chart review it appears that the monitor was not received ultimately by the company for interpretation and a second monitor was planned.  This has not yet been completed however.  Past Medical History:  Diagnosis Date   Bronchitis    Carotid artery disease ([place])    Cervical spine disease    Neck surgery in 2007 with Dr. Joya Salm   COPD (chronic obstructive pulmonary disease) ([place])    Coronary atherosclerosis of native coronary artery    Nonobstrtuctive by catheterization 2007, 2013, 2021   DVT (deep venous thrombosis) ([place])    Essential hypertension    GERD (gastroesophageal reflux disease)    Hyperlipidemia    PAD (peripheral artery disease) ([place])    s/p left fem-pop bypass 2007; PTA and stenting of right iliac artery 09/2011   Rheumatoid arthritis(714.0)    Type 2 diabetes mellitus ([place])     Past Surgical History:  Procedure Laterality Date   ABDOMINAL AORTAGRAM N/A 10/03/2011   Procedure: ABDOMINAL Maxcine Ham;  Surgeon: Conrad Kouts, MD;  Location: [facility] CATH LAB;  Service: Cardiovascular;  Laterality: N/A;   ABDOMINAL AORTOGRAM W/LOWER EXTREMITY N/A 01/16/2017   Procedure: ABDOMINAL AORTOGRAM W/LOWER EXTREMITY;  Surgeon: Conrad [place], MD;  Location: [place] CV LAB;  Service: Cardiovascular;  Laterality: N/A;   ABDOMINAL AORTOGRAM W/LOWER EXTREMITY Bilateral 08/04/2019   Procedure: ABDOMINAL AORTOGRAM W/LOWER EXTREMITY;  Surgeon: Marty Heck, MD;  Location: [place] CV LAB;  Service: Cardiovascular;  Laterality: Bilateral;   ABDOMINAL AORTOGRAM  W/LOWER EXTREMITY N/A 03/09/2020   Procedure: ABDOMINAL AORTOGRAM W/LOWER EXTREMITY;  Surgeon: Marty Heck, MD;  Location: [place] CV LAB;  Service: Cardiovascular;  Laterality: N/A;   CERVICAL DISC SURGERY     COLONOSCOPY WITH PROPOFOL N/A 02/17/2017   Procedure: COLONOSCOPY WITH PROPOFOL;  Surgeon: Daneil Dolin, MD;  [facility];  Service: Endoscopy;  Laterality: N/A;  2:45pm   FEMORAL-POPLITEAL BYPASS GRAFT  11/06/2005   ILIAC ARTERY STENT  10-03-11   Right CIA stenting   LEFT HEART CATH AND CORONARY ANGIOGRAPHY N/A 04/14/2019   Procedure: LEFT HEART CATH AND CORONARY ANGIOGRAPHY;  Surgeon: Troy Sine, MD;  Location: [place] CV LAB;  Service: Cardiovascular;  Laterality: N/A;   PERIPHERAL VASCULAR BALLOON ANGIOPLASTY Right 01/16/2017   Procedure: PERIPHERAL VASCULAR BALLOON ANGIOPLASTY;  Surgeon: Conrad [place], MD;  Location: [place] CV LAB;  Service: Cardiovascular;  Laterality: Right;  common iliac    PERIPHERAL VASCULAR BALLOON ANGIOPLASTY  03/09/2020   Procedure: PERIPHERAL VASCULAR BALLOON ANGIOPLASTY;  Surgeon: Marty Heck, MD;  Location: [place] CV LAB;  Service: Cardiovascular;;   PR VEIN BYPASS GRAFT,AORTO-FEM-POP  06-22-2010   Redo Left Fem-Pop   Repair of left arm fracture     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  atorvastatin (LIPITOR) 80 MG tablet Take 1 tablet (80 mg total) by mouth daily. 90 tablet 3   budesonide-formoterol (SYMBICORT) 160-4.5 MCG/ACT inhaler Take 2 puffs first thing in am and then another 2 puffs about 12 hours later. 10.2 g 11   cilostazol (PLETAL) 100 MG tablet TAKE 1 TABLET(100 MG) BY MOUTH TWICE DAILY 60 tablet 5   clopidogrel (PLAVIX) 75 MG tablet Take 1 tablet (75 mg total) by mouth daily. 30 tablet 11   colchicine 0.6 MG tablet Take 0.6 mg  by mouth 2 (two) times daily. (Patient not taking: Reported on 09/11/2021)     Dexlansoprazole (DEXILANT) 30 MG capsule Take 30- 60 min before your first and last meals of the day (Patient taking differently: Take 30 mg by mouth daily.) 60 capsule 11   diclofenac Sodium (VOLTAREN) 1 % GEL Apply 2 gram  to painful foot and ankle joint twice daily. 150 g 1   glipiZIDE (GLUCOTROL) 5 MG tablet Take 5 mg by mouth daily.     HYDROcodone-acetaminophen (NORCO) 7.5-325 MG per tablet Take 1 tablet by mouth 2 (two) times daily as needed for moderate pain.     isosorbide dinitrate (ISORDIL) 30 MG tablet Take 30 mg by mouth daily.     JANUVIA 100 MG tablet Take 100 mg by mouth daily.     JARDIANCE 25 MG TABS tablet Take 25 mg by mouth daily.     ketoconazole (NIZORAL) 2 % cream Apply to both feet and between toes once daily for 6 weeks. 60 g 1   losartan (COZAAR) 25 MG tablet Take 12.5 mg by mouth daily.     metFORMIN (GLUCOPHAGE) 1000 MG tablet Take 500 mg by mouth daily. (Patient not taking: Reported on 08/15/2021)     metFORMIN (GLUCOPHAGE) 500 MG tablet Take 500 mg by mouth daily.     metoprolol succinate (TOPROL-XL) 25 MG 24 hr tablet Take 25 mg by mouth daily. (Patient not taking: Reported on 08/15/2021)     nitroGLYCERIN (NITROSTAT) 0.4 MG SL tablet Place 1 tablet (0.4 mg total) under the tongue every 5 (five) minutes as needed for chest pain. 25 tablet 3   ONE TOUCH ULTRA TEST test strip      OneTouch Delica Lancets 15V MISC Apply topically.     PROAIR HFA 108 (90 BASE) MCG/ACT inhaler Inhale 2 puffs into the lungs every 6 (six) hours as needed for wheezing or shortness of breath.      sildenafil (REVATIO) 20 MG tablet Take 40-60 mg by mouth daily as needed for erectile dysfunction.     SPIRIVA RESPIMAT 2.5 MCG/ACT AERS      XELJANZ 5 MG TABS Take 1 tablet (5 mg total) by mouth daily. 90 tablet 0   No current facility-administered medications for this visit.   Allergies:  Patient has no known allergies.    Social History: The patient  reports that he quit smoking about 10 years ago. His smoking use included cigarettes and cigars. He has a 45.00 pack-year smoking history. He has never been exposed to tobacco smoke. He has never used smokeless tobacco. He reports current alcohol use. He reports that he does not use drugs.   Family History: The patient's family history includes Cancer in his brother and son; Deep vein thrombosis in his brother and mother; Diabetes in his brother, daughter, father, mother, and sister; Heart attack (age of onset: 24) in his mother; Hyperlipidemia in his brother, father, mother, sister, and son; Hypertension in his brother, father,  mother, sister, and son; Peripheral vascular disease in his brother, father, and mother; Stroke in his father.   ROS:  Please see the history of present illness. Otherwise, complete review of systems is positive for {NONE DEFAULTED:18576}.  All other systems are reviewed and negative.   Physical Exam: VS:  There were no vitals taken for this visit., BMI There is no height or weight on file to calculate BMI.  Wt Readings from Last 3 Encounters:  09/11/21 166 lb 9.6 oz (75.6 kg)  08/15/21 165 lb (74.8 kg)  05/17/21 166 lb 6.4 oz (75.5 kg)    General: Patient appears comfortable at rest. HEENT: Conjunctiva and lids normal, oropharynx clear with moist mucosa. Neck: Supple, no elevated JVP or carotid bruits, no thyromegaly. Lungs: Clear to auscultation, nonlabored breathing at rest. Cardiac: Regular rate and rhythm, no S3 or significant systolic murmur, no pericardial rub. Abdomen: Soft, nontender, no hepatomegaly, bowel sounds present, no guarding or rebound. Extremities: No pitting edema, distal pulses 2+. Skin: Warm and dry. Musculoskeletal: No kyphosis. Neuropsychiatric: Alert and oriented x3, affect grossly appropriate.  ECG:  An ECG dated 05/17/2021 was personally reviewed today and demonstrated:  Sinus tachycardia at 104  bpm.  Recent Labwork: 09/11/2021: ALT 12; AST 17; BUN 17; Creat 1.07; Hemoglobin 9.3; Platelets 432; Potassium 4.5; Sodium 141     Component Value Date/Time   CHOL 146 09/11/2021 0937   CHOL 160 11/09/2019 1032   TRIG 56 09/11/2021 0937   HDL 57 09/11/2021 0937   HDL 61 11/09/2019 1032   CHOLHDL 2.6 09/11/2021 0937   VLDL 17 01/25/2021 1118   LDLCALC 75 09/11/2021 0937    Other Studies Reviewed Today:  Cardiac catheterization 04/14/2019: Prox LAD to Mid LAD lesion is 20% stenosed. Mid LAD lesion is 30% stenosed. Prox RCA lesion is 30% stenosed.   Mild non-obstructive CAD with 20 and 30% irregularities in the proximal to mid LAD; normal ramus intermediate and left circumflex coronary arteries;  and RCA with smooth 30% proximal narrowing with distal vessel tortuosity.   Low normal global LV function with EF estimated at 50%; LVEDP 14 mmHg.  Assessment and Plan:    Medication Adjustments/Labs and Tests Ordered: Current medicines are reviewed at length with the patient today.  Concerns regarding medicines are outlined above.   Tests Ordered: No orders of the defined types were placed in this encounter.   Medication Changes: No orders of the defined types were placed in this encounter.   Disposition:  Follow up {follow up:15908}  Signed, Satira Sark, MD, [facility] 09/24/2021 10:30 AM    [facility] at [address] [place], [place], [place] [address] Phone: [phone]; Fax: [phone]

## 2021-09-25 ENCOUNTER — Ambulatory Visit: Payer: Medicare Other | Admitting: Cardiology

## 2021-09-25 DIAGNOSIS — I25119 Atherosclerotic heart disease of native coronary artery with unspecified angina pectoris: Secondary | ICD-10-CM

## 2021-10-02 ENCOUNTER — Ambulatory Visit: Payer: Medicare Other | Admitting: Internal Medicine

## 2021-10-04 ENCOUNTER — Ambulatory Visit (HOSPITAL_COMMUNITY)
Admission: RE | Admit: 2021-10-04 | Discharge: 2021-10-04 | Disposition: A | Discharge status: Home / Self Care | Payer: Medicare Other | Source: Ambulatory Visit | Attending: Internal Medicine | Admitting: Internal Medicine

## 2021-10-04 ENCOUNTER — Encounter: Payer: Self-pay | Admitting: Internal Medicine

## 2021-10-04 ENCOUNTER — Ambulatory Visit (INDEPENDENT_AMBULATORY_CARE_PROVIDER_SITE_OTHER): Payer: Medicare Other | Admitting: Internal Medicine

## 2021-10-04 VITALS — BP 128/82 | HR 100 | Temp 98.0°F | Ht 69.0 in | Wt 167.4 lb

## 2021-10-04 DIAGNOSIS — R0989 Other specified symptoms and signs involving the circulatory and respiratory systems: Secondary | ICD-10-CM | POA: Diagnosis not present

## 2021-10-04 DIAGNOSIS — J449 Chronic obstructive pulmonary disease, unspecified: Secondary | ICD-10-CM

## 2021-10-04 MED ORDER — AZITHROMYCIN 250 MG PO TABS: ORAL_TABLET | ORAL | 0 refills | Status: DC

## 2021-10-04 MED ORDER — PREDNISONE 10 MG PO TABS: ORAL_TABLET | ORAL | 0 refills | Status: DC

## 2021-10-04 NOTE — Assessment & Plan Note (Addendum)
Quit smoking 2013 PFT's 01/04/15  FEV1 2.15 (71 % ) ratio 58  p 14 % improvement from saba p ? prior to study with DLCO  70 % corrects to 94  % for alv volume  With only mild curvature f/v loop  - Spirometry 01/28/2017  FEV1 1.59 (53%)  Ratio 59 off all rx  - 01/28/2017    symb 160 2bid and d/c acei and double the dexilant > marked improvement 02/13/2017  PFT's  04/04/2017  FEV1 1.99 (68 % ) ratio 62  p 23 % improvement from saba p nothing prior to study with DLCO  63 % corrects to 82  % for alv volume   - 04/04/2017  After extensive coaching inhaler device  effectiveness =    90%  - 11/04/2017  Walked RA x 3 laps @ 185 ft each stopped due to  End of study, fast pace, no  Desat, mild sob  - 11/04/2017  After extensive coaching inhaler device,  effectiveness =    75% > restart symb 160 2bid - Spirometry 11/04/2017  FEV1 1.4 (47%)  Ratio 58 off all rx     - 05/20/2018  After extensive coaching inhaler device,  effectiveness = 75% with symb and 90% with SMI > try add spiriva 2 pffs each am   - 05/20/2018   Walked RA  2 laps @  approx 278f each @ fast pace  stopped due to  End of study, no cp or sob or desats    -  08/09/2020   Walked RA  3 laps @ approx 2573feach @ fast pace  stopped due to end of study, min  sob, sats 96% at end   -09/28/2020  After extensive coaching inhaler device,  effectiveness =    75% (short Ti) for both smi and hfa    Mild flare p cruise with active AB  > rx Prednisone 10 mg take  4 each am x 2 days,   2 each am x 2 days,  1 each am x 2 days and stop and zpak    Group D (now reclassified as E) in terms of symptom/risk and laba/lama/ICS  therefore appropriate rx at this point >>>  Continue symbicort160/ spiriva 2.5 respimat and approp saba   F/u q 6 m, sooner if needed          Each maintenance medication was reviewed in detail including emphasizing most importantly the difference between maintenance and prns and under what circumstances the prns are to be triggered using an  action plan format where appropriate.  Total time for H and P, chart review, counseling, reviewing hfa/smi device(s) and generating customized AVS unique to this office visit / same day charting = 25 min

## 2021-10-04 NOTE — Progress Notes (Signed)
Subjective:    Patient ID: [name], male   DOB: [birth year]    MRN: [id number]    Brief patient profile:  70  yobm quit smoking 2013 with dx of copd GOLD II with reversibility on prn inhalers didn't really use it much and "worse over the years" and placed on BREO but worse fall  2018 so placed on Trelegy and referred to pulmonary clinic 01/28/2017 by Dr   [name]      History of Present Illness  01/28/2017 1st [place] [facility] office visit/ [name]  ? Copd   Chief Complaint  Patient presents with   Pulmonary Consult    Referred by Dr. [name] for eval of COPD. Pt states that he has been having SOB for the past several yrs, worse for the past 3-6 months. He gets SOB with walking short distances and up and down stairs. He has CP that comes and goes. He also has some cough with white sputum.  He is using proair once daily on average.   doe x years much worse x sev months, cp is not related to breathing or exertion localized L ant chest/ lasts just a few secs Cough is worse since sob worse/ esp hs and early in am Baseline = 15 min walk outside mostly flat but struggles if gets in a hurry or steps  Thinks proair helps his cp but note resolves in 2 secs whether uses proair or not rec Continue the dexilant but for now take it Take 30- 60 min before your first and last meals of the day until cough and breathing better then just Take 30-60 min before first meal of the day  Stop Trelegy and lisinopril Losartan 50 mg only daily takes the place of lisinopril  Plan A = Automatic = symbicort 160 Take 2 puffs first thing in am and then another 2 puffs about 12 hours later.  Work on inhaler technique:   Plan B = Backup Only use your albuterol as a rescue medication  05/20/2018 acute extended  ov/[name] re: copd/ recurrent cp x one month/ did not bring meds as requested   Chief Complaint  Patient presents with   Acute Visit    Pt c/o left side CP and increased SOB x 1 month. He has had muscle aches for the  past 6 wks.   Dyspnea:  Walks around park x 15 min daily never tries inhaler before walks, sob stops him and legs hurt but no cp with exertion  Cough: no Sleeping: on side bed is flat two pillows SABA use: bid avg  02: none  Cp comes and goes x years 2-3 min, always in same place / never supine/ did not follow IBS recs rec Plan A = Automatic = symbicort 160 Take 2 puffs first thing in am and then another 2 puffs about 12 hours later.  Add spiriva 2 pffs each am only  Plan B = Backup Only use your albuterol as a rescue medication  Classic   pain pattern suggests ibs rx citrucel/diet          08/09/2020  f/u ov/[name] re: GOLD 2/3  copd maint on  symbicort /spiriva  Chief Complaint  Patient presents with   Follow-up    Pt c/o unintentional wt loss and pain on his left side "for a good while".  His breathing is unchanged.    Dyspnea:  working out at gym x 15 min x ? Speed  and ex bike =resistance at 3 and ?  Speed x 5-8 min and denies sob limiting  or any ex cp Cough: sporadic  Sleeping: bed is flat / months  SABA use: not using  02: none  Covid status:   vax x 3  Rec Rx as IBS Please schedule a follow up office visit in 4 weeks, sooner if needed  with all medications /inhalers/ solutions in hand so we can verify exactly what you are taking. This includes all medications from all doctors and over the counters      10/04/2021  f/u ov/[name] office/[name] re: GOLD 2  maint on symbicort/ spiriva   Chief Complaint  Patient presents with   Follow-up    Feels he has had increased SOB lately    Dyspnea:  getting over cold p cruise, neg covid / ? Gout in L foot slowing him down  Cough: improving but still congested p trip/ mucus thick yellow esp in am  Sleeping: flat bed/ 2 pillows  SABA use: none  02: never      No obvious day to day or daytime variability or assoc mucus plugs or hemoptysis or cp or chest tightness, subjective wheeze or overt sinus or hb symptoms.   Sleeping   without nocturnal  or early am exacerbation  of respiratory  c/o's or need for noct saba. Also denies any obvious fluctuation of symptoms with weather or environmental changes or other aggravating or alleviating factors except as outlined above   No unusual exposure hx or h/o childhood pna/ asthma or knowledge of premature birth.  Current Allergies, Complete Past Medical History, Past Surgical History, Family History, and Social History were reviewed in [employer] record.  ROS  The following are not active complaints unless bolded Hoarseness, sore throat, dysphagia, dental problems, itching, sneezing,  nasal congestion or discharge of excess mucus or purulent secretions, ear ache,   fever, chills, sweats, unintended wt loss or wt gain, classically pleuritic or exertional cp,  orthopnea pnd or arm/hand swelling  or leg swelling, presyncope, palpitations, abdominal pain, anorexia, nausea, vomiting, diarrhea  or change in bowel habits or change in bladder habits, change in stools or change in urine, dysuria, hematuria,  rash, arthralgias, visual complaints, headache, numbness, weakness or ataxia or problems with walking or coordination,  change in mood or  memory.        Current Meds  Medication Sig   aspirin EC 81 MG tablet Take 1 tablet (81 mg total) by mouth daily.   atorvastatin (LIPITOR) 80 MG tablet Take 1 tablet (80 mg total) by mouth daily.   budesonide-formoterol (SYMBICORT) 160-4.5 MCG/ACT inhaler Take 2 puffs first thing in am and then another 2 puffs about 12 hours later.   cilostazol (PLETAL) 100 MG tablet TAKE 1 TABLET(100 MG) BY MOUTH TWICE DAILY   clopidogrel (PLAVIX) 75 MG tablet Take 1 tablet (75 mg total) by mouth daily.   colchicine 0.6 MG tablet Take 0.6 mg by mouth 2 (two) times daily.   Dexlansoprazole (DEXILANT) 30 MG capsule Take 30- 60 min before your first and last meals of the day (Patient taking differently: Take 30 mg by mouth daily.)   diclofenac  Sodium (VOLTAREN) 1 % GEL Apply 2 gram  to painful foot and ankle joint twice daily.   glipiZIDE (GLUCOTROL) 5 MG tablet Take 5 mg by mouth daily.   HYDROcodone-acetaminophen (NORCO) 7.5-325 MG per tablet Take 1 tablet by mouth 2 (two) times daily as needed for moderate pain.   isosorbide dinitrate (ISORDIL) 30 MG  tablet Take 30 mg by mouth daily.   JANUVIA 100 MG tablet Take 100 mg by mouth daily.   JARDIANCE 25 MG TABS tablet Take 25 mg by mouth daily.   ketoconazole (NIZORAL) 2 % cream Apply to both feet and between toes once daily for 6 weeks.   losartan (COZAAR) 25 MG tablet Take 12.5 mg by mouth daily.   metFORMIN (GLUCOPHAGE) 1000 MG tablet Take 500 mg by mouth daily.   metFORMIN (GLUCOPHAGE) 500 MG tablet Take 500 mg by mouth daily.   metoprolol succinate (TOPROL-XL) 25 MG 24 hr tablet Take 25 mg by mouth daily.   nitroGLYCERIN (NITROSTAT) 0.4 MG SL tablet Place 1 tablet (0.4 mg total) under the tongue every 5 (five) minutes as needed for chest pain.   ONE TOUCH ULTRA TEST test strip    OneTouch Delica Lancets 09U MISC Apply topically.   PROAIR HFA 108 (90 BASE) MCG/ACT inhaler Inhale 2 puffs into the lungs every 6 (six) hours as needed for wheezing or shortness of breath.    sildenafil (REVATIO) 20 MG tablet Take 40-60 mg by mouth daily as needed for erectile dysfunction.   SPIRIVA RESPIMAT 2.5 MCG/ACT AERS    XELJANZ 5 MG TABS Take 1 tablet (5 mg total) by mouth daily.                            Objective:   Physical Exam   Wts  10/04/2021        167   09/28/2020      166   08/09/2020         158 02/09/2019         176  08/05/2018       172 05/20/2018       176 11/04/2017     180  04/04/2017       179  03/11/2017         180   02/13/2017        175   01/28/17 175 lb 12.8 oz (79.7 kg)  01/16/17 175 lb (79.4 kg)  11/01/16 170 lb (77.1 kg)    Vital signs reviewed  10/04/2021  - Note at rest 02 sats  96% on RA   General appearance:    amb bm nad    HEENT : Oropharynx   clear  Nasal turbinates nl    NECK :  without  apparent JVD/ palpable Nodes/TM    LUNGS: no acc muscle use,  Mild barrel  contour chest wall with  R > L base insp /exp rhonchi  and  without cough on insp or exp maneuvers  and mild  Hyperresonant  to  percussion bilaterally     CV:  RRR  no s3 or murmur or increase in P2, and no edema   ABD:  soft and nontender with pos end  insp Hoover's  in the supine position.  No bruits or organomegaly appreciated   MS:  Nl gait/ ext warm without deformities Or obvious joint restrictions  calf tenderness, cyanosis or clubbing     SKIN: warm and dry without lesions    NEURO:  alert, approp, nl sensorium with  no motor or cerebellar deficits apparent.          CXR PA and Lateral:   10/04/2021 :    I personally reviewed images and impression is as follows:     No acute changes  Assessment:

## 2021-10-04 NOTE — Patient Instructions (Addendum)
Prednisone 10 mg take  4 each am x 2 days,   2 each am x 2 days,  1 each am x 2 days and stop   Zpak (stop colchicine if on it until you finish the zpak)   Please remember to go to the  x-ray department  @  [facility] for your tests - we will call you with the results when they are available   Please schedule a follow up office visit in 6 months , call sooner if needed

## 2021-10-05 ENCOUNTER — Encounter: Payer: Self-pay | Admitting: Internal Medicine

## 2021-10-05 DIAGNOSIS — E1165 Type 2 diabetes mellitus with hyperglycemia: Secondary | ICD-10-CM | POA: Diagnosis not present

## 2021-10-05 DIAGNOSIS — I10 Essential (primary) hypertension: Secondary | ICD-10-CM | POA: Diagnosis not present

## 2021-10-11 DIAGNOSIS — R002 Palpitations: Secondary | ICD-10-CM | POA: Diagnosis not present

## 2021-10-12 ENCOUNTER — Telehealth: Payer: Self-pay | Admitting: Internal Medicine

## 2021-10-12 NOTE — Telephone Encounter (Signed)
ATC patient. Left vm letting patient know that no further changes means no changes to the recommendations made at his ov. So to follow AVS instructions from Dr.Wert. advised patient to call back if he had any more questions or needed further clarification.  Nothing further needed

## 2021-10-13 ENCOUNTER — Other Ambulatory Visit: Payer: Self-pay | Admitting: Internal Medicine

## 2021-10-14 DIAGNOSIS — R079 Chest pain, unspecified: Secondary | ICD-10-CM | POA: Diagnosis not present

## 2021-10-14 DIAGNOSIS — J029 Acute pharyngitis, unspecified: Secondary | ICD-10-CM | POA: Diagnosis not present

## 2021-10-14 DIAGNOSIS — D649 Anemia, unspecified: Secondary | ICD-10-CM | POA: Diagnosis not present

## 2021-10-14 DIAGNOSIS — B9789 Other viral agents as the cause of diseases classified elsewhere: Secondary | ICD-10-CM | POA: Diagnosis not present

## 2021-10-14 DIAGNOSIS — Z87891 Personal history of nicotine dependence: Secondary | ICD-10-CM | POA: Diagnosis not present

## 2021-10-14 DIAGNOSIS — J069 Acute upper respiratory infection, unspecified: Secondary | ICD-10-CM | POA: Diagnosis not present

## 2021-10-14 DIAGNOSIS — Z1152 Encounter for screening for COVID-19: Secondary | ICD-10-CM | POA: Diagnosis not present

## 2021-10-14 DIAGNOSIS — R0789 Other chest pain: Secondary | ICD-10-CM | POA: Diagnosis not present

## 2021-10-14 DIAGNOSIS — R059 Cough, unspecified: Secondary | ICD-10-CM | POA: Diagnosis not present

## 2021-10-15 DIAGNOSIS — R079 Chest pain, unspecified: Secondary | ICD-10-CM | POA: Diagnosis not present

## 2021-10-16 DIAGNOSIS — I10 Essential (primary) hypertension: Secondary | ICD-10-CM | POA: Diagnosis not present

## 2021-10-16 DIAGNOSIS — R252 Cramp and spasm: Secondary | ICD-10-CM | POA: Diagnosis not present

## 2021-10-16 DIAGNOSIS — D509 Iron deficiency anemia, unspecified: Secondary | ICD-10-CM | POA: Diagnosis not present

## 2021-10-16 DIAGNOSIS — Z299 Encounter for prophylactic measures, unspecified: Secondary | ICD-10-CM | POA: Diagnosis not present

## 2021-10-16 DIAGNOSIS — K921 Melena: Secondary | ICD-10-CM | POA: Diagnosis not present

## 2021-10-16 DIAGNOSIS — K219 Gastro-esophageal reflux disease without esophagitis: Secondary | ICD-10-CM | POA: Diagnosis not present

## 2021-10-17 ENCOUNTER — Ambulatory Visit (INDEPENDENT_AMBULATORY_CARE_PROVIDER_SITE_OTHER): Payer: Medicare Other | Admitting: Podiatry

## 2021-10-17 DIAGNOSIS — M05672 Rheumatoid arthritis of left ankle and foot with involvement of other organs and systems: Secondary | ICD-10-CM

## 2021-10-17 DIAGNOSIS — M79675 Pain in left toe(s): Secondary | ICD-10-CM

## 2021-10-17 DIAGNOSIS — M79674 Pain in right toe(s): Secondary | ICD-10-CM

## 2021-10-17 DIAGNOSIS — B351 Tinea unguium: Secondary | ICD-10-CM | POA: Diagnosis not present

## 2021-10-17 DIAGNOSIS — E1159 Type 2 diabetes mellitus with other circulatory complications: Secondary | ICD-10-CM

## 2021-10-17 DIAGNOSIS — B353 Tinea pedis: Secondary | ICD-10-CM | POA: Diagnosis not present

## 2021-10-17 DIAGNOSIS — R609 Edema, unspecified: Secondary | ICD-10-CM | POA: Diagnosis not present

## 2021-10-17 MED ORDER — KETOCONAZOLE 2 % EX CREA: TOPICAL_CREAM | CUTANEOUS | 1 refills | Status: DC

## 2021-10-17 MED ORDER — DICLOFENAC SODIUM 1 % EX GEL: CUTANEOUS | 1 refills | Status: DC

## 2021-10-17 NOTE — Progress Notes (Signed)
  Subjective:  Patient ID: [name], male    DOB: 1950-10-26,  MRN: [id number]  [name] presents to clinic today for:  Chief Complaint  Patient presents with   Nail Problem    Diabetic foot care BS-250 A1C-7.3 PCP-Vyas PCP VST-10/16/2021   Requesting refills of diclofenac gel 1% and Ketoconazole Cream 2%. He relates residual scaling on bottom of both feet.  New problem(s): None.   PCP is Vyas, Rennis Petty B, MD.  No Known Allergies  Review of Systems: Negative except as noted in the HPI.  Objective:  [name] is a pleasant 71 y.o. male WD, WN in NAD. AAO x 3.  Vascular Examination: CFT <5 seconds b/l LE. Diminished pedal pulse(s) b/l LE.  No pain with calf compression b/l. Lower extremity skin temperature gradient warm to warm b/l. Trace edema noted LLE. No ischemia or gangrene noted b/l LE. No cyanosis or clubbing noted b/l LE.  Dermatological Examination: Pedal skin thin, shiny and atrophic b/l LE. No open wounds b/l LE. No interdigital macerations noted b/l LE.   Toenails 1-5 bilaterally elongated, discolored, dystrophic, thickened, and crumbly with subungual debris and tenderness to dorsal palpation.   No hyperkeratotic nor porokeratotic lesions present on today's visit.   Residual diffuse scaling noted peripherally and plantarly b/l feet.  No interdigital macerations.  No blisters, no weeping. No signs of secondary bacterial infection noted. No breaks in skin.  Musculoskeletal Examination: Muscle strength 5/5 to all lower extremity muscle groups bilaterally. HAV with bunion deformity noted b/l LE. Hammertoe deformity noted 2-5 b/l.   Bony prominence noted dorsal midfoot joint LLE.  Neurological Examination: Pt has subjective symptoms of neuropathy. Protective sensation intact 5/5 intact bilaterally with 10g monofilament b/l. Vibratory sensation intact b/l.  Assessment/Plan: 1. Pain due to onychomycosis of toenails of both feet   2. Type 2 diabetes  mellitus with vascular disease ([place])   3. Tinea pedis of both feet   4. 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 Swelling     Meds ordered this encounter  Medications   ketoconazole (NIZORAL) 2 % cream    Sig: Apply to both feet and between toes once daily for 6 weeks.    Dispense:  60 g    Refill:  1   diclofenac Sodium (VOLTAREN) 1 % GEL    Sig: Apply 2 gram  to painful foot and ankle joint twice daily.    Dispense:  150 g    Refill:  1    -Consent given for treatment as described below: -Examined patient. -Patient knows to contact Vascular Surgery if he develops any acute open wounds, rest pain or discoloration of feet. -Patient/POA educated on dangers of using sharp instrumentation on toes/feet. Recommended continued professional foot care in presence of diabetes and PAD. Patient/POA relates understanding. -Mycotic toenails 1-5 bilaterally were debrided in length and girth with sterile nail nippers and dremel without incident. -Rx refilled for Ketoconazole Cream 2% to be applied once daily for six weeks. -Refilled Diclofenac Gel for OA foot/ Apply to left foot once daily for pain. -Patient/POA to call should there be question/concern in the interim.   Return in about 9 weeks (around 12/19/2021).  Marzetta Board, DPM

## 2021-10-21 ENCOUNTER — Encounter: Payer: Self-pay | Admitting: Podiatry

## 2021-10-25 DIAGNOSIS — I10 Essential (primary) hypertension: Secondary | ICD-10-CM | POA: Diagnosis not present

## 2021-10-25 DIAGNOSIS — Z299 Encounter for prophylactic measures, unspecified: Secondary | ICD-10-CM | POA: Diagnosis not present

## 2021-10-25 DIAGNOSIS — J069 Acute upper respiratory infection, unspecified: Secondary | ICD-10-CM | POA: Diagnosis not present

## 2021-10-25 DIAGNOSIS — D509 Iron deficiency anemia, unspecified: Secondary | ICD-10-CM | POA: Diagnosis not present

## 2021-10-29 DIAGNOSIS — Z713 Dietary counseling and surveillance: Secondary | ICD-10-CM | POA: Diagnosis not present

## 2021-10-29 DIAGNOSIS — Z6823 Body mass index (BMI) 23.0-23.9, adult: Secondary | ICD-10-CM | POA: Diagnosis not present

## 2021-10-29 DIAGNOSIS — D509 Iron deficiency anemia, unspecified: Secondary | ICD-10-CM | POA: Diagnosis not present

## 2021-10-29 DIAGNOSIS — I10 Essential (primary) hypertension: Secondary | ICD-10-CM | POA: Diagnosis not present

## 2021-10-29 DIAGNOSIS — M961 Postlaminectomy syndrome, not elsewhere classified: Secondary | ICD-10-CM | POA: Diagnosis not present

## 2021-10-29 DIAGNOSIS — Z299 Encounter for prophylactic measures, unspecified: Secondary | ICD-10-CM | POA: Diagnosis not present

## 2021-10-29 DIAGNOSIS — M4802 Spinal stenosis, cervical region: Secondary | ICD-10-CM | POA: Diagnosis not present

## 2021-10-30 DIAGNOSIS — D649 Anemia, unspecified: Secondary | ICD-10-CM | POA: Diagnosis not present

## 2021-10-31 ENCOUNTER — Telehealth: Payer: Self-pay | Admitting: Cardiology

## 2021-10-31 NOTE — Telephone Encounter (Signed)
Patient was calling back a heart monitor. Please advise

## 2021-10-31 NOTE — Telephone Encounter (Signed)
Patient states that he has not received results from when he wore his heart monitor. After looking through patients chart, called [facility] and spoke with Alda Berthold, LPN who is assisting with getting patient the results requested.

## 2021-10-31 NOTE — Telephone Encounter (Signed)
Patient called requesting his results from the heart monitor. Please advise

## 2021-11-01 NOTE — Progress Notes (Signed)
Referring Provider: [name], MD Primary Care Physician:  [name], MD Primary Gastroenterologist:  Dr. Gala Romney  Chief Complaint  Patient presents with   Anemia    Having black stools for almost to 2 months.     HPI:   [name] is a 71 y.o. male with history of severe peripheral vascular disease status post multiple interventions, CAD, heart palpitations, COPD, DVT, HTN, HLD, type 2 diabetes, rheumatoid arthritis, GERD, presenting today at the request of Brett Bears B, MD for consult EGD/Colonoscopy due to anemia.   Per chart review, baseline hemoglobin in the 11-13 range.  Hemoglobin declined to 9.3 (L) on 09/11/2021.    Labs with PCP 10/16/2021: Hemoglobin 9.0 (L), MCV 76 (L), MCH 22 (L), MCHC 28.9 (L), platelets 546, iron 28 (L), saturation 7% (L).   Most recent labs 10/30/2021 with hemoglobin of 9.0 (L).  Appears he was scheduled for blood transfusion on 10/24 due to symptomatic anemia.   Patient recently completed heart monitor for palpitations.  Today:  Daily black stools for the last couple of months. 1 BM daily. No diarrhea. Having a little constipation since starting iron, but started taking stool softener daily a couple days ago. Just started iron daily about 2 weeks ago. Has taken pepto but nothing routine. Was taking 81 mg aspirin, but PCP had him stop taking it last week. No other NSAIDs. No brbpr.   About 1 week ago, was having upper abdominal pain after taking medications. Was daily, but now improved. Cramping. Didn't last long. Associated gassiness. Some nausea but not vomiting. No association with meals.   Has history of acid reflux. Symptoms improved with Dexilant 60 mg daily, but has only been taking 30 mg daily because he is trying to get rid of his original Rx first. Occasional dysphagia. Typically to rice or peanuts. No trouble with meats or pills.   No recent weight loss.   Was having lightheadedness, SOB, weakness. Improved after blood transfusion  last week. Reports receiving 1 unit.   Chronic neck, shoulder and back pain. Takes Norco.   Last colonoscopy 02/17/2017 for rectal bleeding and abnormal CT of GI tract: Many small and large mouth diverticula in the sigmoid, descending, transverse colon, internal hemorrhoids that were medium sized and grade 1.  Suspected CT findings were artifactual in origin and suspected anorectal bleeding from hemorrhoids.  Hyperplastic polyp removed in 2013.  No prior EGD.    Past Medical History:  Diagnosis Date   Bronchitis    Carotid artery disease ([facility])    Cervical spine disease    Neck surgery in 2007 with Dr. Joya Salm   COPD (chronic obstructive pulmonary disease) ([place])    Coronary atherosclerosis of native coronary artery    Nonobstrtuctive by catheterization 2007, 2013, 2021   DVT (deep venous thrombosis) ([place])    Essential hypertension    GERD (gastroesophageal reflux disease)    Hyperlipidemia    PAD (peripheral artery disease) ([place])    s/p left fem-pop bypass 2007; PTA and stenting of right iliac artery 09/2011   Rheumatoid arthritis(714.0)    Type 2 diabetes mellitus ([place])     Past Surgical History:  Procedure Laterality Date   ABDOMINAL AORTAGRAM N/A 10/03/2011   Procedure: ABDOMINAL Maxcine Ham;  Surgeon: Conrad Hatteras, MD;  Location: [facility] CATH LAB;  Service: Cardiovascular;  Laterality: N/A;   ABDOMINAL AORTOGRAM W/LOWER EXTREMITY N/A 01/16/2017   Procedure: ABDOMINAL AORTOGRAM W/LOWER EXTREMITY;  Surgeon: Conrad Nichols, MD;  Location: [facility]  CV LAB;  Service: Cardiovascular;  Laterality: N/A;   ABDOMINAL AORTOGRAM W/LOWER EXTREMITY Bilateral 08/04/2019   Procedure: ABDOMINAL AORTOGRAM W/LOWER EXTREMITY;  Surgeon: Marty Heck, MD;  Location: [place] [facility] CV LAB;  Service: Cardiovascular;  Laterality: Bilateral;   ABDOMINAL AORTOGRAM W/LOWER EXTREMITY N/A 03/09/2020   Procedure: ABDOMINAL AORTOGRAM W/LOWER EXTREMITY;  Surgeon: Marty Heck, MD;  Location: [facility] CV LAB;  Service: Cardiovascular;  Laterality: N/A;   CERVICAL DISC SURGERY     COLONOSCOPY  2013   Hyperplastic polyp   COLONOSCOPY WITH PROPOFOL N/A 02/17/2017   Surgeon: Daneil Dolin, MD; Many small and large mouth diverticula in the sigmoid, descending, transverse colon, internal hemorrhoids that were medium sized and grade 1.   FEMORAL-POPLITEAL BYPASS GRAFT  11/06/2005   ILIAC ARTERY STENT  10/03/2011   Right CIA stenting   LEFT HEART CATH AND CORONARY ANGIOGRAPHY N/A 04/14/2019   Procedure: LEFT HEART CATH AND CORONARY ANGIOGRAPHY;  Surgeon: Troy Sine, MD;  Location: [facility] CV LAB;  Service: Cardiovascular;  Laterality: N/A;   PERIPHERAL VASCULAR BALLOON ANGIOPLASTY Right 01/16/2017   Procedure: PERIPHERAL VASCULAR BALLOON ANGIOPLASTY;  Surgeon: Conrad Meeker, MD;  Location: [facility] CV LAB;  Service: Cardiovascular;  Laterality: Right;  common iliac    PERIPHERAL VASCULAR BALLOON ANGIOPLASTY  03/09/2020   Procedure: PERIPHERAL VASCULAR BALLOON ANGIOPLASTY;  Surgeon: Marty Heck, MD;  Location: [facility] CV LAB;  Service: Cardiovascular;;   PR VEIN BYPASS GRAFT,AORTO-FEM-POP  06/22/2010   Redo Left Fem-Pop   Repair of left arm fracture     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 90 tablet 3   budesonide-formoterol (SYMBICORT) 160-4.5 MCG/ACT inhaler Take 2 puffs first thing in am and then another 2 puffs about 12 hours later. 10.2 g 11   cilostazol (PLETAL) 100 MG tablet TAKE 1 TABLET(100 MG) BY MOUTH TWICE DAILY 60 tablet 5   clopidogrel (PLAVIX) 75 MG tablet Take 1 tablet (75 mg total) by mouth daily. 30 tablet 11   colchicine 0.6 MG tablet Take 0.6 mg by mouth 2 (two) times daily.     Dexlansoprazole (DEXILANT) 30 MG capsule Take 30- 60 min before your first and last meals  of the day (Patient taking differently: Take 30 mg by mouth daily.) 60 capsule 11   diclofenac Sodium (VOLTAREN) 1 % GEL Apply 2 gram  to painful foot and ankle joint twice daily. 150 g 1   ferrous sulfate 325 (65 FE) MG tablet Take 325 mg by mouth daily with breakfast.     glipiZIDE (GLUCOTROL) 5 MG tablet Take 5 mg by mouth daily.     HYDROcodone-acetaminophen (NORCO) 7.5-325 MG per tablet Take 1 tablet by mouth 2 (two) times daily as needed for moderate pain.     isosorbide dinitrate (ISORDIL) 30 MG tablet Take 30 mg by mouth daily.     JANUVIA 100 MG tablet Take 100 mg by mouth daily.     JARDIANCE 25 MG TABS tablet Take 25 mg by mouth daily.     ketoconazole (NIZORAL) 2 % cream Apply to both feet and between toes once daily for 6 weeks. 60 g 1   losartan (COZAAR) 25 MG tablet Take 25 mg by mouth daily.     metoprolol succinate (TOPROL-XL) 25 MG  24 hr tablet Take 25 mg by mouth daily.     nitroGLYCERIN (NITROSTAT) 0.4 MG SL tablet Place 1 tablet (0.4 mg total) under the tongue every 5 (five) minutes as needed for chest pain. 25 tablet 3   ONE TOUCH ULTRA TEST test strip      OneTouch Delica Lancets 38S MISC Apply topically.     PROAIR HFA 108 (90 BASE) MCG/ACT inhaler Inhale 2 puffs into the lungs every 6 (six) hours as needed for wheezing or shortness of breath.      sildenafil (REVATIO) 20 MG tablet Take 40-60 mg by mouth daily as needed for erectile dysfunction.     Tiotropium Bromide Monohydrate (SPIRIVA RESPIMAT) 2.5 MCG/ACT AERS INHALE 2 PUFFS INTO THE LUNGS DAILY 4 g 11   XELJANZ 5 MG TABS Take 1 tablet (5 mg total) by mouth daily. 90 tablet 0   aspirin EC 81 MG tablet Take 1 tablet (81 mg total) by mouth daily. (Patient not taking: Reported on 11/05/2021)     metFORMIN (GLUCOPHAGE) 500 MG tablet Take 500 mg by mouth daily. (Patient not taking: Reported on 11/05/2021)     No current facility-administered medications for this visit.    Allergies as of 11/05/2021   (No Known  Allergies)    Family History  Problem Relation Age of Onset   Heart attack Mother 58   Deep vein thrombosis Mother    Diabetes Mother    Hyperlipidemia Mother    Hypertension Mother    Peripheral vascular disease Mother    Stroke Father    Diabetes Father    Hyperlipidemia Father    Hypertension Father    Peripheral vascular disease Father    Diabetes Sister    Hyperlipidemia Sister    Hypertension Sister    Diabetes Brother    Hyperlipidemia Brother    Cancer Brother    Deep vein thrombosis Brother    Hypertension Brother    Peripheral vascular disease Brother    Hyperlipidemia Son    Cancer Son    Hypertension Son    Diabetes Daughter    Colon cancer Neg Hx     Social History   Socioeconomic History   Marital status: Single    Spouse name: Not on file   Number of children: Not on file   Years of education: Not on file   Highest education level: Not on file  Occupational History   Not on file  Tobacco Use   Smoking status: Former    Packs/day: 1.00    Years: 45.00    Total pack years: 45.00    Types: Cigarettes, Cigars    Quit date: 09/01/2011    Years since quitting: 10.1    Passive exposure: Never   Smokeless tobacco: Never  Vaping Use   Vaping Use: Never used  Substance and Sexual Activity   Alcohol use: Yes    Comment: beer every blue moon   Drug use: No   Sexual activity: Not Currently    Birth control/protection: None  Other Topics Concern   Not on file  Social History Narrative   The patient lives in [place].    Social Determinants of Health   Financial Resource Strain: Not on file  Food Insecurity: Not on file  Transportation Needs: Not on file  Physical Activity: Not on file  Stress: Not on file  Social Connections: Not on file  Intimate Partner Violence: Not on file    Review of Systems: Gen: Denies any fever,  chills, cold or flulike symptoms, presyncope, syncope. CV: Denies chest pain. Admits to heart palpitations. Resp: Denies  shortness of breath, cough.   GI: See HPI GU : Denies urinary burning, urinary frequency, urinary hesitancy MS: Denies joint pain.  Derm: Denies rash. Psych: Denies depression, anxiety. Heme: See HPI  Physical Exam: BP 122/74 (BP Location: Right Arm, Patient Position: Sitting, Cuff Size: Normal)   Pulse 100   Temp 97.9 F (36.6 C) (Temporal)   Ht '5\' 9"'$  (1.753 m)   Wt 167 lb (75.8 kg)   BMI 24.66 kg/m  General:   Alert and oriented. Pleasant and cooperative. Well-nourished and well-developed.  Head:  Normocephalic and atraumatic. Eyes:  Without icterus, sclera clear and conjunctiva pink.  Ears:  Normal auditory acuity. Lungs:  Clear to auscultation bilaterally. No wheezes, rales, or rhonchi. No distress.  Heart:  S1, S2 present without murmurs appreciated.  Abdomen:  +BS, soft, non-tender and non-distended. No HSM noted. No guarding or rebound. No masses appreciated.  Rectal:  Deferred  Msk:  Symmetrical without gross deformities. Normal posture. Extremities:  Without edema. Neurologic:  Alert and  oriented x4;  grossly normal neurologically. Skin:  Intact without significant lesions or rashes. Psych: Normal mood and affect.    Assessment:  71 y.o. male with history of severe peripheral vascular disease status post multiple interventions, CAD, heart palpitations, COPD, DVT, HTN, HLD, type 2 diabetes, rheumatoid arthritis, GERD, presenting today for further evaluation of IDA.  IDA: Baseline hemoglobin previously in the 11-13 range.  Hemoglobin declined to 9.3 in September 2023, down to 9.0 on 10/16/2021 with microcytic indices, iron saturation 7%, iron 28.  He received blood transfusion 10/24 for symptomatic anemia, per patient 1 unit.  Hemoglobin was 9.0 on 10/24.  No repeat CBC since that time. He reports symptoms of lightheadedness and SOB have improved.    This is in the setting of daily black stools for the last couple of months. Also developed cramping upper abdominal pain  after taking medications last week, now improved. Also with chronic GERD that hasn't been adequately controlled on Dexilant 30 mg daily, discussed below. Denies nausea, vomiting, brbpr. Just started oral iron about 2 weeks ago.  No regular use of Pepto-Bismol.  Has been taking 81 mg aspirin, but PCP had him stop this last week.  No other NSAIDs.  He does take Plavix and Pletal. Abdominal exam is benign today.  He has never had an EGD.  Last colonoscopy in 2019 with diverticulosis, internal hemorrhoids.  Prior colonoscopy in 2013 with hyperplastic polyp removed.  In the setting of black stools, I am concerned that IDA is secondary to an upper GI bleed secondary to PUD, gastritis, duodenitis, esophagitis, but unable to rule out small bowel etiologies such as AVMs, or right-sided colonic lesions including polyps, malignancy, or AVMs as well.  I recommended EGD and colonoscopy for further evaluation.  GERD: Chronic.  Not adequately managed on Dexilant 30 mg daily.  PCP recently increased Dexilant to 60 mg daily which was helpful, but he has since resumed taking 30 mg daily in efforts to finish his old prescription.  Recommended taking to 30 mg tablets daily until he runs out, then resume Dexilant 60 mg tablet daily.  Dysphagia: Vague, intermittent dysphagia to rice and peanuts.  No trouble with meats or pills.  May have esophagitis in the setting of uncontrolled GERD.  Could have esophageal web, ring, or stricture.  We are proceeding with EGD in the near future for evaluation of  IDA will add on possible dilation as appropriate.  Heart palpitations: Patient reports intermittent sensation of his heart beating fast and irregular.  He completed a heart monitor recently and has not received results.  We will request cardiac clearance prior to proceeding with procedures.    Plan:  CBC Proceed with upper endoscopy +/- dilation + colonoscopy with propofol by Dr. Abbey Chatters in efforts to have procedures completed  ASAP. The risks, benefits, and alternatives have been discussed with the patient in detail. The patient states understanding and desires to proceed.  ASA 3 We will get medical clearance from cardiology as well as approval to hold Plavix for 5 days prior to procedures. Hold iron x10 days prior to procedures. Separate written instructions provided for diabetes medication adjustments. Increase Dexilant to 60 mg daily.  Advised to take two 30 mg tablets together until old prescription has been completed, then switch to 60 mg tablet daily. Advised to proceed to the emergency room if he develops lightheadedness, dizziness, presyncope. Follow-up after procedures.   Aliene Altes, PA-C [facility] 11/05/2021

## 2021-11-01 NOTE — Telephone Encounter (Signed)
Left detailed message making patient aware that per Dr. Audie Box, his results for the heart monitor are not back yet.

## 2021-11-05 ENCOUNTER — Other Ambulatory Visit: Payer: Self-pay | Admitting: Gastroenterology

## 2021-11-05 ENCOUNTER — Telehealth: Payer: Self-pay | Admitting: *Deleted

## 2021-11-05 ENCOUNTER — Ambulatory Visit (INDEPENDENT_AMBULATORY_CARE_PROVIDER_SITE_OTHER): Payer: Medicare Other | Admitting: Gastroenterology

## 2021-11-05 ENCOUNTER — Encounter: Payer: Self-pay | Admitting: Gastroenterology

## 2021-11-05 VITALS — BP 122/74 | HR 100 | Temp 97.9°F | Ht 69.0 in | Wt 167.0 lb

## 2021-11-05 DIAGNOSIS — K219 Gastro-esophageal reflux disease without esophagitis: Secondary | ICD-10-CM

## 2021-11-05 DIAGNOSIS — K921 Melena: Secondary | ICD-10-CM | POA: Diagnosis not present

## 2021-11-05 DIAGNOSIS — I10 Essential (primary) hypertension: Secondary | ICD-10-CM | POA: Diagnosis not present

## 2021-11-05 DIAGNOSIS — R1310 Dysphagia, unspecified: Secondary | ICD-10-CM | POA: Diagnosis not present

## 2021-11-05 DIAGNOSIS — D509 Iron deficiency anemia, unspecified: Secondary | ICD-10-CM | POA: Diagnosis not present

## 2021-11-05 DIAGNOSIS — E1165 Type 2 diabetes mellitus with hyperglycemia: Secondary | ICD-10-CM | POA: Diagnosis not present

## 2021-11-05 NOTE — Patient Instructions (Addendum)
Please have blood work completed at [facility].  We will arrange for you to have an upper endoscopy with possible stretching of your esophagus and colonoscopy in the near future. We are reaching out to your cardiologist to get medical clearance as well last approval to hold Plavix for 5 days prior to procedures.  Increase Dexilant to 60 mg total daily.  You can take 2 of your 30 mg tablets together at one time until you run out, then switch to one 60 mg tablet daily.  We will plan to see back in the office after your procedures.  If you have any lightheadedness, dizziness, feeling like you will pass out, proceed to the emergency room.  Aliene Altes, PA-C [facility]

## 2021-11-05 NOTE — Telephone Encounter (Signed)
Pt will also need to hold Jardiance for 72 hours.

## 2021-11-05 NOTE — Telephone Encounter (Signed)
  Request for patient to stop medication prior to procedure or is needing cleareance  11/05/21  [name] 05/12/1950  What type of surgery is being performed? Colonoscopy Esophagogastroduodenoscopy (EGD) with possible esophageal dilation  When is surgery scheduled? TBD  What type of clearance is required (medical or pharmacy to hold medication or both?   Pt needs cardiac clearance as well as to hold medication.  Are there any medications that need to be held prior to surgery and how long? Plavix x 5 days  Name of physician performing surgery?  [facility] Phone: [phone] Fax: [phone]  Anethesia type (none, local, MAC, general)? MAC

## 2021-11-06 LAB — CBC/DIFF AMBIGUOUS DEFAULT
Basophils Absolute: 0 10*3/uL (ref 0.0–0.2)
Basos: 0 %
EOS (ABSOLUTE): 0.1 10*3/uL (ref 0.0–0.4)
Eos: 1 %
Hematocrit: 37.2 % — ABNORMAL LOW (ref 37.5–51.0)
Hemoglobin: 10.6 g/dL — ABNORMAL LOW (ref 13.0–17.7)
Immature Grans (Abs): 0 10*3/uL (ref 0.0–0.1)
Immature Granulocytes: 0 %
Lymphocytes Absolute: 1.4 10*3/uL (ref 0.7–3.1)
Lymphs: 19 %
MCH: 23 pg — ABNORMAL LOW (ref 26.6–33.0)
MCHC: 28.5 g/dL — ABNORMAL LOW (ref 31.5–35.7)
MCV: 81 fL (ref 79–97)
Monocytes Absolute: 0.3 10*3/uL (ref 0.1–0.9)
Monocytes: 5 %
Neutrophils Absolute: 5.5 10*3/uL (ref 1.4–7.0)
Neutrophils: 75 %
Platelets: 412 10*3/uL (ref 150–450)
RBC: 4.61 x10E6/uL (ref 4.14–5.80)
RDW: 21 % — ABNORMAL HIGH (ref 11.6–15.4)
WBC: 7.3 10*3/uL (ref 3.4–10.8)

## 2021-11-06 LAB — SPECIMEN STATUS REPORT

## 2021-11-07 ENCOUNTER — Telehealth: Payer: Self-pay | Admitting: *Deleted

## 2021-11-07 DIAGNOSIS — E119 Type 2 diabetes mellitus without complications: Secondary | ICD-10-CM | POA: Diagnosis not present

## 2021-11-07 DIAGNOSIS — Z7984 Long term (current) use of oral hypoglycemic drugs: Secondary | ICD-10-CM | POA: Diagnosis not present

## 2021-11-07 DIAGNOSIS — X501XXA Overexertion from prolonged static or awkward postures, initial encounter: Secondary | ICD-10-CM | POA: Diagnosis not present

## 2021-11-07 DIAGNOSIS — I10 Essential (primary) hypertension: Secondary | ICD-10-CM | POA: Diagnosis not present

## 2021-11-07 DIAGNOSIS — F1090 Alcohol use, unspecified, uncomplicated: Secondary | ICD-10-CM | POA: Diagnosis not present

## 2021-11-07 DIAGNOSIS — J439 Emphysema, unspecified: Secondary | ICD-10-CM | POA: Diagnosis not present

## 2021-11-07 DIAGNOSIS — Z7951 Long term (current) use of inhaled steroids: Secondary | ICD-10-CM | POA: Diagnosis not present

## 2021-11-07 DIAGNOSIS — Z7902 Long term (current) use of antithrombotics/antiplatelets: Secondary | ICD-10-CM | POA: Diagnosis not present

## 2021-11-07 DIAGNOSIS — S46812A Strain of other muscles, fascia and tendons at shoulder and upper arm level, left arm, initial encounter: Secondary | ICD-10-CM | POA: Diagnosis not present

## 2021-11-07 DIAGNOSIS — K219 Gastro-esophageal reflux disease without esophagitis: Secondary | ICD-10-CM | POA: Diagnosis not present

## 2021-11-07 DIAGNOSIS — R0600 Dyspnea, unspecified: Secondary | ICD-10-CM | POA: Diagnosis not present

## 2021-11-07 DIAGNOSIS — D649 Anemia, unspecified: Secondary | ICD-10-CM | POA: Diagnosis not present

## 2021-11-07 DIAGNOSIS — Z87891 Personal history of nicotine dependence: Secondary | ICD-10-CM | POA: Diagnosis not present

## 2021-11-07 DIAGNOSIS — Z79899 Other long term (current) drug therapy: Secondary | ICD-10-CM | POA: Diagnosis not present

## 2021-11-07 NOTE — Telephone Encounter (Signed)
Pt agreeable to plan of care for tele pre op appt 11/14/21 @ 10 am. Med rec and consent are done.

## 2021-11-07 NOTE — Telephone Encounter (Signed)
   Name: [name]  DOB: [birth year]  MRN: [id number]  Primary Cardiologist: Rozann Lesches, MD  Chart reviewed as part of pre-operative protocol coverage. Because of [name] past medical history and time since last visit, he will require a follow-up telephone visit in order to better assess preoperative cardiovascular risk.  Pre-op covering staff: - Please schedule appointment and call patient to inform them. If patient already had an upcoming appointment within acceptable timeframe, please add "pre-op clearance" to the appointment notes so provider is aware. - Please contact requesting surgeon's office via preferred method (i.e, phone, fax) to inform them of need for appointment prior to surgery.  Plavix is not prescribed by Cardiology since it is for PAD. Will need to reach out to prescribing physician for holding request.   Elgie Collard, PA-C  11/07/2021, 1:00 PM

## 2021-11-07 NOTE — Telephone Encounter (Signed)
Pt agreeable to plan of care for tele pre op appt 11/14/21 @ 10 am. Med rec and consent are done.     Patient Consent for Virtual Visit        [name] has provided verbal consent on 11/07/2021 for a virtual visit (video or telephone).   CONSENT FOR VIRTUAL VISIT FOR:  [name]  By participating in this virtual visit I agree to the following:  I hereby voluntarily request, consent and authorize [facility] and its employed or contracted physicians, physician assistants, nurse practitioners or other licensed health care professionals (the Practitioner), to provide me with telemedicine health care services (the "Services") as deemed necessary by the treating Practitioner. I acknowledge and consent to receive the Services by the Practitioner via telemedicine. I understand that the telemedicine visit will involve communicating with the Practitioner through live audiovisual communication technology and the disclosure of certain medical information by electronic transmission. I acknowledge that I have been given the opportunity to request an in-person assessment or other available alternative prior to the telemedicine visit and am voluntarily participating in the telemedicine visit.  I understand that I have the right to withhold or withdraw my consent to the use of telemedicine in the course of my care at any time, without affecting my right to future care or treatment, and that the Practitioner or I may terminate the telemedicine visit at any time. I understand that I have the right to inspect all information obtained and/or recorded in the course of the telemedicine visit and may receive copies of available information for a reasonable fee.  I understand that some of the potential risks of receiving the Services via telemedicine include:  Delay or interruption in medical evaluation due to technological equipment failure or disruption; Information transmitted may not be sufficient  (e.g. poor resolution of images) to allow for appropriate medical decision making by the Practitioner; and/or  In rare instances, security protocols could fail, causing a breach of personal health information.  Furthermore, I acknowledge that it is my responsibility to provide information about my medical history, conditions and care that is complete and accurate to the best of my ability. I acknowledge that Practitioner's advice, recommendations, and/or decision may be based on factors not within their control, such as incomplete or inaccurate data provided by me or distortions of diagnostic images or specimens that may result from electronic transmissions. I understand that the practice of medicine is not an exact science and that Practitioner makes no warranties or guarantees regarding treatment outcomes. I acknowledge that a copy of this consent can be made available to me via my patient portal ([facility]), or I can request a printed copy by calling the office of [facility].    I understand that my insurance will be billed for this visit.   I have read or had this consent read to me. I understand the contents of this consent, which adequately explains the benefits and risks of the Services being provided via telemedicine.  I have been provided ample opportunity to ask questions regarding this consent and the Services and have had my questions answered to my satisfaction. I give my informed consent for the services to be provided through the use of telemedicine in my medical care

## 2021-11-09 DIAGNOSIS — J439 Emphysema, unspecified: Secondary | ICD-10-CM | POA: Diagnosis not present

## 2021-11-09 DIAGNOSIS — Z87891 Personal history of nicotine dependence: Secondary | ICD-10-CM | POA: Diagnosis not present

## 2021-11-09 DIAGNOSIS — B349 Viral infection, unspecified: Secondary | ICD-10-CM | POA: Diagnosis not present

## 2021-11-09 DIAGNOSIS — Z20822 Contact with and (suspected) exposure to covid-19: Secondary | ICD-10-CM | POA: Diagnosis not present

## 2021-11-09 DIAGNOSIS — R42 Dizziness and giddiness: Secondary | ICD-10-CM | POA: Diagnosis not present

## 2021-11-09 DIAGNOSIS — M069 Rheumatoid arthritis, unspecified: Secondary | ICD-10-CM | POA: Diagnosis not present

## 2021-11-09 DIAGNOSIS — Z7902 Long term (current) use of antithrombotics/antiplatelets: Secondary | ICD-10-CM | POA: Diagnosis not present

## 2021-11-09 DIAGNOSIS — R531 Weakness: Secondary | ICD-10-CM | POA: Diagnosis not present

## 2021-11-09 DIAGNOSIS — Z79899 Other long term (current) drug therapy: Secondary | ICD-10-CM | POA: Diagnosis not present

## 2021-11-09 DIAGNOSIS — E119 Type 2 diabetes mellitus without complications: Secondary | ICD-10-CM | POA: Diagnosis not present

## 2021-11-09 DIAGNOSIS — K219 Gastro-esophageal reflux disease without esophagitis: Secondary | ICD-10-CM | POA: Diagnosis not present

## 2021-11-09 DIAGNOSIS — I10 Essential (primary) hypertension: Secondary | ICD-10-CM | POA: Diagnosis not present

## 2021-11-13 ENCOUNTER — Ambulatory Visit (INDEPENDENT_AMBULATORY_CARE_PROVIDER_SITE_OTHER)
Admission: RE | Admit: 2021-11-13 | Discharge: 2021-11-13 | Disposition: A | Discharge status: Home / Self Care | Payer: Medicare Other | Source: Ambulatory Visit | Attending: Vascular Surgery | Admitting: Vascular Surgery

## 2021-11-13 ENCOUNTER — Ambulatory Visit (HOSPITAL_COMMUNITY)
Admission: RE | Admit: 2021-11-13 | Discharge: 2021-11-13 | Disposition: A | Discharge status: Home / Self Care | Payer: Medicare Other | Source: Ambulatory Visit | Attending: Vascular Surgery | Admitting: Vascular Surgery

## 2021-11-13 ENCOUNTER — Ambulatory Visit (INDEPENDENT_AMBULATORY_CARE_PROVIDER_SITE_OTHER): Payer: Medicare Other | Admitting: Physician Assistant

## 2021-11-13 VITALS — BP 143/85 | HR 84 | Temp 97.7°F | Resp 20 | Ht 69.0 in | Wt 164.0 lb

## 2021-11-13 DIAGNOSIS — Z95828 Presence of other vascular implants and grafts: Secondary | ICD-10-CM

## 2021-11-13 DIAGNOSIS — I70213 Atherosclerosis of native arteries of extremities with intermittent claudication, bilateral legs: Secondary | ICD-10-CM

## 2021-11-13 DIAGNOSIS — I779 Disorder of arteries and arterioles, unspecified: Secondary | ICD-10-CM | POA: Diagnosis not present

## 2021-11-13 DIAGNOSIS — I739 Peripheral vascular disease, unspecified: Secondary | ICD-10-CM | POA: Diagnosis not present

## 2021-11-13 NOTE — Progress Notes (Signed)
Established Previous Bypass   History of Present Illness   [name] is a 71 y.o. (Dec 30, 1950) male who presents for follow up of PAD. He has a history of left common femoral to above knee popliteal bypass with PTFE in 2007. He also had a right common iliac artery stent placed in 2013. His most recent intervention was on 03/09/2020 with right mid SFA and proximal R ATA angioplasty by Dr.Clark. This was done for a R 3rd toe ulcer is well-healed now.   At his last visit with [place] in May 2023, he was experiencing some pain behind his left knee.  All studies done that day reassured him that there were no arterial sources to his pain.   At today's visit he is pain free. He denies any claudication, rest pain, or tissue loss of the lower extremities. He does describe some episodes of swelling and redness around his left lateral ankle. He states that this happens every couple of months. He denies any fevers.  He has also been having some dark stools for the past couple of months. His PCP just recently stopped his aspirin. His last hemoglobin was around 9, but his baseline is 11-13. His GI has plans for a colonoscopy/endoscopy hopefully by the end of this month.  Current Outpatient Medications  Medication Sig Dispense Refill   atorvastatin (LIPITOR) 80 MG tablet Take 1 tablet (80 mg total) by mouth daily. 90 tablet 3   budesonide-formoterol (SYMBICORT) 160-4.5 MCG/ACT inhaler Take 2 puffs first thing in am and then another 2 puffs about 12 hours later. 10.2 g 11   cilostazol (PLETAL) 100 MG tablet TAKE 1 TABLET(100 MG) BY MOUTH TWICE DAILY 60 tablet 5   clopidogrel (PLAVIX) 75 MG tablet Take 1 tablet (75 mg total) by mouth daily. 30 tablet 11   colchicine 0.6 MG tablet Take 0.6 mg by mouth 2 (two) times daily.     Dexlansoprazole (DEXILANT) 30 MG capsule Take 30- 60 min before your first and last meals of the day (Patient taking differently: Take 30 mg by mouth daily.) 60 capsule 11   diclofenac  Sodium (VOLTAREN) 1 % GEL Apply 2 gram  to painful foot and ankle joint twice daily. 150 g 1   ferrous sulfate 325 (65 FE) MG tablet Take 325 mg by mouth daily with breakfast.     glipiZIDE (GLUCOTROL) 5 MG tablet Take 5 mg by mouth daily.     HYDROcodone-acetaminophen (NORCO) 7.5-325 MG per tablet Take 1 tablet by mouth 2 (two) times daily as needed for moderate pain.     isosorbide dinitrate (ISORDIL) 30 MG tablet Take 30 mg by mouth daily.     JANUVIA 100 MG tablet Take 100 mg by mouth daily.     JARDIANCE 25 MG TABS tablet Take 25 mg by mouth daily.     ketoconazole (NIZORAL) 2 % cream Apply to both feet and between toes once daily for 6 weeks. 60 g 1   losartan (COZAAR) 25 MG tablet Take 25 mg by mouth daily.     metFORMIN (GLUCOPHAGE) 500 MG tablet Take 500 mg by mouth daily.     metoprolol succinate (TOPROL-XL) 25 MG 24 hr tablet Take 25 mg by mouth daily.     nitroGLYCERIN (NITROSTAT) 0.4 MG SL tablet Place 1 tablet (0.4 mg total) under the tongue every 5 (five) minutes as needed for chest pain. 25 tablet 3   ONE TOUCH ULTRA TEST test strip      OneTouch  Delica Lancets 21H MISC Apply topically.     PROAIR HFA 108 (90 BASE) MCG/ACT inhaler Inhale 2 puffs into the lungs every 6 (six) hours as needed for wheezing or shortness of breath.      sildenafil (REVATIO) 20 MG tablet Take 40-60 mg by mouth daily as needed for erectile dysfunction.     Tiotropium Bromide Monohydrate (SPIRIVA RESPIMAT) 2.5 MCG/ACT AERS INHALE 2 PUFFS INTO THE LUNGS DAILY 4 g 11   XELJANZ 5 MG TABS Take 1 tablet (5 mg total) by mouth daily. 90 tablet 0   aspirin EC 81 MG tablet Take 1 tablet (81 mg total) by mouth daily. (Patient not taking: Reported on 11/13/2021)     No current facility-administered medications for this visit.    REVIEW OF SYSTEMS (negative unless checked):   Cardiac:  '[]'$  Chest pain or chest pressure? '[]'$  Shortness of breath upon activity? '[]'$  Shortness of breath when lying flat? '[]'$  Irregular  heart rhythm?  Vascular:  '[]'$  Pain in calf, thigh, or hip brought on by walking? '[]'$  Pain in feet at night that wakes you up from your sleep? '[]'$  Blood clot in your veins? '[]'$  Leg swelling?  Pulmonary:  '[]'$  Oxygen at home? '[]'$  Productive cough? '[]'$  Wheezing?  Neurologic:  '[]'$  Sudden weakness in arms or legs? '[]'$  Sudden numbness in arms or legs? '[]'$  Sudden onset of difficult speaking or slurred speech? '[]'$  Temporary loss of vision in one eye? '[]'$  Problems with dizziness?  Gastrointestinal:  '[x]'$  Blood in stool? '[]'$  Vomited blood?  Genitourinary:  '[]'$  Burning when urinating? '[]'$  Blood in urine?  Psychiatric:  '[]'$  Major depression  Hematologic:  '[]'$  Bleeding problems? '[]'$  Problems with blood clotting?  Dermatologic:  '[]'$  Rashes or ulcers?  Constitutional:  '[]'$  Fever or chills?  Ear/Nose/Throat:  '[]'$  Change in hearing? '[]'$  Nose bleeds? '[]'$  Sore throat?  Musculoskeletal:  '[]'$  Back pain? '[]'$  Joint pain? '[]'$  Muscle pain?   Physical Examination   Vitals:   11/13/21 0928  BP: (!) 143/85  Pulse: 84  Resp: 20  Temp: 97.7 F (36.5 C)  TempSrc: Temporal  SpO2: 97%  Weight: 164 lb (74.4 kg)  Height: '5\' 9"'$  (1.753 m)   Body mass index is 24.22 kg/m.  General:  WDWN in NAD; vital signs documented above Gait: Not observed HENT: WNL, normocephalic Pulmonary: normal non-labored breathing , without Rales, rhonchi,  wheezing Cardiac: regular HR, without murmurs without carotid bruit Abdomen: soft, NT, no masses Skin: without rashes Vascular Exam/Pulses: Brisk R DP doppler signal. Monophasic L DP doppler signal. Extremities: without ischemic changes, without Gangrene , without cellulitis; without open wounds;  Musculoskeletal: no muscle wasting or atrophy  Neurologic: A&O X 3;  No focal weakness or paresthesias are detected Psychiatric:  The pt has Normal affect.  Non-Invasive Vascular Imaging ABI (11/13/2021)  +-------+-----------+-----------+------------+------------+   ABI/TBIToday's ABIToday's TBIPrevious ABIPrevious TBI  +-------+-----------+-----------+------------+------------+  Right [place]         0.48       [place]          0.41          +-------+-----------+-----------+------------+------------+  Left  1.23       0.35       [place]          0.30          +-------+-----------+-----------+------------+------------+   Aortoiliac Duplex (11/13/2021)  1-49% R CIA stent stenosis  Bypass Duplex (11/13/2021)  Left: Patent Left femoral-popliteal bypass without evidence of stenosis.   Medical Decision Making  Zyrion Coey is a 71 y.o. male who presents for surveillance of PAD  Based on the patient's vascular studies, his ABIs are essentially unchanged since his last visit.  He has multiphasic flow in his right DP and monophasic flow in his left DP.  Aortoiliac duplex demonstrates 1 to 49% stenosis throughout the right common iliac artery stent.  Velocities are less than 200 cm/s and there is triphasic flow throughout the stent Bypass graft duplex demonstrates a patent left femoropopliteal bypass with no signs of stenosis or decreased velocities The patient should continue to take his statin.  He is currently not taking his aspirin due to dark stools.  I have recommended to the patient that he can stop his Pletal indefinitely.  He may stop his Plavix 5 days prior to his endoscopy/colonoscopy procedure and restart after the procedure He can follow-up with [place] in 4 months with repeat ABIs, aortoiliac duplex, and left bypass graft duplex   Vicente Serene PA-C [facility]: [facility] MD: [facility]

## 2021-11-14 ENCOUNTER — Ambulatory Visit: Payer: Medicare Other | Attending: Cardiology | Admitting: General Practice

## 2021-11-14 DIAGNOSIS — Z01810 Encounter for preprocedural cardiovascular examination: Secondary | ICD-10-CM

## 2021-11-14 NOTE — Progress Notes (Signed)
Virtual Visit via Telephone Note   Because of [place] co-morbid illnesses, he is at least at moderate risk for complications without adequate follow up.  This format is felt to be most appropriate for this patient at this time.  The patient did not have access to video technology/had technical difficulties with video requiring transitioning to audio format only (telephone).  All issues noted in this document were discussed and addressed.  No physical exam could be performed with this format.  Please refer to the patient's chart for his consent to telehealth for [facility].  Evaluation Performed:  Preoperative cardiovascular risk assessment _____________   Date:  11/14/2021   Patient ID:  [name], DOB 1950-04-16, MRN [id number] Patient Location:  Home Provider location:   Office  Primary Care Provider:  [name], MD Primary Cardiologist:  Rozann Lesches, MD  Chief Complaint / Patient Profile   71 y.o. y/o male with a h/o coronary artery disease, PVD, HTN, COPD, GERD who is pending colonoscopy/EGD with esophageal dilation and presents today for telephonic preoperative cardiovascular risk assessment.  Past Medical History    Past Medical History:  Diagnosis Date   Bronchitis    Carotid artery disease ([place])    Cervical spine disease    Neck surgery in 2007 with Dr. Joya Salm   COPD (chronic obstructive pulmonary disease) ([place])    Coronary atherosclerosis of native coronary artery    Nonobstrtuctive by catheterization 2007, 2013, 2021   DVT (deep venous thrombosis) ([place])    Essential hypertension    GERD (gastroesophageal reflux disease)    Hyperlipidemia    PAD (peripheral artery disease) ([place])    s/p left fem-pop bypass 2007; PTA and stenting of right iliac artery 09/2011   Rheumatoid arthritis(714.0)    Type 2 diabetes mellitus ([place])    Past Surgical History:  Procedure Laterality Date   ABDOMINAL AORTAGRAM N/A 10/03/2011   Procedure: ABDOMINAL  Maxcine Ham;  Surgeon: Conrad [place] Creek, MD;  Location: [facility] CATH LAB;  Service: Cardiovascular;  Laterality: N/A;   ABDOMINAL AORTOGRAM W/LOWER EXTREMITY N/A 01/16/2017   Procedure: ABDOMINAL AORTOGRAM W/LOWER EXTREMITY;  Surgeon: Conrad Lindisfarne, MD;  Location: [facility] CV LAB;  Service: Cardiovascular;  Laterality: N/A;   ABDOMINAL AORTOGRAM W/LOWER EXTREMITY Bilateral 08/04/2019   Procedure: ABDOMINAL AORTOGRAM W/LOWER EXTREMITY;  Surgeon: Marty Heck, MD;  Location: [facility] CV LAB;  Service: Cardiovascular;  Laterality: Bilateral;   ABDOMINAL AORTOGRAM W/LOWER EXTREMITY N/A 03/09/2020   Procedure: ABDOMINAL AORTOGRAM W/LOWER EXTREMITY;  Surgeon: Marty Heck, MD;  Location: [facility] CV LAB;  Service: Cardiovascular;  Laterality: N/A;   CERVICAL DISC SURGERY     COLONOSCOPY  2013   Hyperplastic polyp   COLONOSCOPY WITH PROPOFOL N/A 02/17/2017   Surgeon: Daneil Dolin, MD; Many small and large mouth diverticula in the sigmoid, descending, transverse colon, internal hemorrhoids that were medium sized and grade 1.   FEMORAL-POPLITEAL BYPASS GRAFT  11/06/2005   ILIAC ARTERY STENT  10/03/2011   Right CIA stenting   LEFT HEART CATH AND CORONARY ANGIOGRAPHY N/A 04/14/2019   Procedure: LEFT HEART CATH AND CORONARY ANGIOGRAPHY;  Surgeon: Troy Sine, MD;  Location: [facility] CV LAB;  Service: Cardiovascular;  Laterality: N/A;   PERIPHERAL VASCULAR BALLOON ANGIOPLASTY Right 01/16/2017   Procedure: PERIPHERAL VASCULAR BALLOON ANGIOPLASTY;  Surgeon: Conrad [place], MD;  Location: [facility] CV LAB;  Service: Cardiovascular;  Laterality: Right;  common iliac    PERIPHERAL VASCULAR BALLOON ANGIOPLASTY  03/09/2020   Procedure: PERIPHERAL VASCULAR BALLOON ANGIOPLASTY;  Surgeon: Marty Heck, MD;  Location: [facility] CV LAB;  Service: Cardiovascular;;   PR VEIN BYPASS GRAFT,AORTO-FEM-POP  06/22/2010   Redo Left Fem-Pop   Repair of left arm fracture     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 male who presents via audio/video conferencing for a telehealth visit today.  Pt was last seen in cardiology clinic on 08/15/2021 by Dr. Audie Box.  At that time [name] was doing well .  The patient is now pending procedure as outlined above. Since his last visit, he continues to be stable from a cardiac standpoint.  Today he denies chest pain, shortness of breath, lower extremity edema, fatigue, palpitations, hematuria, hemoptysis, diaphoresis, weakness, presyncope, syncope, orthopnea, and PND.   Home Medications    Prior to Admission medications   Medication Sig Start Date End Date Taking? Authorizing Provider  aspirin EC 81 MG tablet Take 1 tablet (81 mg total) by mouth daily. Patient not taking: Reported on 11/13/2021 04/13/19   Erlene Quan, PA-C  atorvastatin (LIPITOR) 80 MG tablet Take 1 tablet (80 mg total) by mouth daily. 02/07/21 05/18/22  Furth, Cadence H, PA-C  budesonide-formoterol (SYMBICORT) 160-4.5 MCG/ACT inhaler Take 2 puffs first thing in am and then another 2 puffs about 12 hours later. 09/28/20   Tanda Rockers, MD  cilostazol (PLETAL) 100 MG tablet TAKE 1 TABLET(100 MG) BY MOUTH TWICE DAILY 04/30/21   Waynetta Sandy, MD  clopidogrel (PLAVIX) 75 MG tablet Take 1 tablet (75 mg total) by mouth daily. 04/17/16   Conrad  Hills, MD  colchicine 0.6 MG tablet Take 0.6 mg by mouth 2 (two) times daily. 05/18/21   [provider]  Dexlansoprazole (DEXILANT) 30 MG capsule Take 30- 60 min before your first and last meals of the day Patient taking differently: Take 30 mg by mouth daily. 01/28/17   Tanda Rockers, MD  diclofenac Sodium (VOLTAREN) 1 % GEL Apply 2 gram  to painful foot and ankle joint twice daily. 10/17/21   Marzetta Board, DPM  ferrous sulfate 325 (65 FE) MG tablet Take 325 mg by mouth daily with breakfast.     [provider]  glipiZIDE (GLUCOTROL) 5 MG tablet Take 5 mg by mouth daily. 02/19/21   [provider]  HYDROcodone-acetaminophen (NORCO) 7.5-325 MG per tablet Take 1 tablet by mouth 2 (two) times daily as needed for moderate pain. 06/24/13   [provider]  isosorbide dinitrate (ISORDIL) 30 MG tablet Take 30 mg by mouth daily.    [provider]  JANUVIA 100 MG tablet Take 100 mg by mouth daily. 07/26/13   [provider]  JARDIANCE 25 MG TABS tablet Take 25 mg by mouth daily. 01/12/20   [provider]  ketoconazole (NIZORAL) 2 % cream Apply to both feet and between toes once daily for 6 weeks. 10/17/21   Marzetta Board, DPM  losartan (COZAAR) 25 MG tablet Take 25 mg by mouth daily.    [provider]  metFORMIN (GLUCOPHAGE) 500 MG tablet Take 500 mg by mouth daily. 03/30/21   [provider]  metoprolol succinate (TOPROL-XL) 25 MG 24 hr tablet Take 25 mg by mouth daily. 08/06/21   [provider]  nitroGLYCERIN (NITROSTAT) 0.4 MG SL tablet  Place 1 tablet (0.4 mg total) under the tongue every 5 (five) minutes as needed for chest pain. 04/13/19   Erlene Quan, PA-C  ONE TOUCH ULTRA TEST test strip  08/13/13   [provider]  OneTouch Delica Lancets 93T MISC Apply topically. 06/11/19   [provider]  PROAIR HFA 108 (90 BASE) MCG/ACT inhaler Inhale 2 puffs into the lungs every 6 (six) hours as needed for wheezing or shortness of breath.  07/12/12   [provider]  sildenafil (REVATIO) 20 MG tablet Take 40-60 mg by mouth daily as needed for erectile dysfunction. 03/01/20   [provider]  Tiotropium Bromide Monohydrate (SPIRIVA RESPIMAT) 2.5 MCG/ACT AERS INHALE 2 PUFFS INTO THE LUNGS DAILY 10/15/21   Tanda Rockers, MD  XELJANZ 5 MG TABS Take 1 tablet (5 mg total) by mouth daily. 09/11/21   Ofilia Neas, PA-C    Physical Exam    Vital Signs:  [name] does not have vital signs  available for review today.  Given telephonic nature of communication, physical exam is limited. AAOx3. NAD. Normal affect.  Speech and respirations are unlabored.  Accessory Clinical Findings    None  Assessment & Plan    1.  Preoperative Cardiovascular Risk Assessment: Colonoscopy, EGD with possible esophageal dilation Dr. Abbey Chatters     Primary Cardiologist: Rozann Lesches, MD  Chart reviewed as part of pre-operative protocol coverage. Given past medical history and time since last visit, based on ACC/AHA guidelines, [name] would be at acceptable risk for the planned procedure without further cardiovascular testing.   Patient was advised that if he develops new symptoms prior to surgery to contact our office to arrange a follow-up appointment.  He verbalized understanding.  Plavix is not prescribed by Cardiology since it is for PAD. Will need to reach out to prescribing physician for holding request.    I will route this recommendation to the requesting party via Epic fax function and remove from pre-op pool.     Time:   Today, I have spent 9 minutes with the patient with telehealth technology discussing medical history, symptoms, and management plan.  Prior to his phone evaluation I spent greater than 10 minutes reviewing his past medical history and cardiac medications   Deberah Pelton, NP  11/14/2021, 7:19 AM

## 2021-11-14 NOTE — Progress Notes (Signed)
NOTES FAXED TO DR. CARVER

## 2021-11-15 ENCOUNTER — Telehealth: Payer: Self-pay | Admitting: *Deleted

## 2021-11-15 ENCOUNTER — Telehealth: Payer: Self-pay

## 2021-11-15 DIAGNOSIS — Z299 Encounter for prophylactic measures, unspecified: Secondary | ICD-10-CM | POA: Diagnosis not present

## 2021-11-15 DIAGNOSIS — M542 Cervicalgia: Secondary | ICD-10-CM | POA: Diagnosis not present

## 2021-11-15 DIAGNOSIS — I10 Essential (primary) hypertension: Secondary | ICD-10-CM | POA: Diagnosis not present

## 2021-11-15 DIAGNOSIS — K1121 Acute sialoadenitis: Secondary | ICD-10-CM | POA: Diagnosis not present

## 2021-11-15 DIAGNOSIS — E1165 Type 2 diabetes mellitus with hyperglycemia: Secondary | ICD-10-CM | POA: Diagnosis not present

## 2021-11-15 NOTE — Telephone Encounter (Signed)
Pt called to get clarification on what medication to stop taking (Pletal) based on his office visit with APP earlier this week. Pt verbalized understanding; no further questions/concerns at this time.

## 2021-11-15 NOTE — Telephone Encounter (Signed)
See cardiac notes.

## 2021-11-15 NOTE — Telephone Encounter (Signed)
Ajani Rineer Apr 22, 1950   What type of surgery is being performed? Colonoscopy Esophagogastroduodenoscopy (EGD) with possible esophageal dilation   When is surgery scheduled? TBD   Are there any medications that need to be held prior to surgery and how long? Plavix x 5 days   Name of physician performing surgery?  [facility] Phone: [phone] Fax: [phone]   Anethesia type (none, local, MAC, general)? MAC

## 2021-11-15 NOTE — Telephone Encounter (Signed)
Noted sent to [facility]

## 2021-11-15 NOTE — Telephone Encounter (Signed)
Please reach out to Dr. Monica Martinez with [facility] regarding holding Plavix for 5 days prior to procedures.

## 2021-11-20 ENCOUNTER — Other Ambulatory Visit: Payer: Self-pay

## 2021-11-20 DIAGNOSIS — I739 Peripheral vascular disease, unspecified: Secondary | ICD-10-CM

## 2021-11-20 DIAGNOSIS — Z95828 Presence of other vascular implants and grafts: Secondary | ICD-10-CM

## 2021-11-27 NOTE — Progress Notes (Unsigned)
Office Visit Note  Patient: [name]             Date of Birth: Jan 13, 1950           MRN: [id number]             PCP: [name], MD Referring: [name], MD Visit Date: 12/11/2021 Occupation: '@GUAROCC'$ @  Subjective:  Left foot pain   History of Present Illness: [name] is a 71 y.o. male with history of seronegative rheumatoid arthritis and osteoarthritis.  Patient remains on Xeljanz 5 mg 1 tablet by mouth daily.  Patient reports that he often times forgets to take his prescription for Xeljanz.  He has not yet taken his prescription for Morrie Sheldon today.  He tolerated Morrie Sheldon without any side effects. Patient reports for the past 3 to 4 months he has been experiencing intermittent pain in the left foot.  He denies any injury prior to the onset of symptoms.  He has noticed discoloration on the top of his foot.  He was evaluated by Dr. Elisha Ponder his podiatrist who recommended the use of Voltaren gel. He has not yet tried an injection.  He has tried Voltaren gel but continues to have persistent discomfort and swelling at times.  He currently rates the pain a 7 out of 10.  He has not became appointment with Dr. Elisha Ponder on 12/19/21 and will be seeing his PCP tomorrow.   He denies any other joint pain or joint swelling at this time.     Activities of Daily Living:  Patient reports morning stiffness for 10 minutes daily    Patient Reports nocturnal pain.  Difficulty dressing/grooming: Denies Difficulty climbing stairs: Reports Difficulty getting out of chair: Reports Difficulty using hands for taps, buttons, cutlery, and/or writing: Denies  Review of Systems  Constitutional:  Positive for fatigue. Negative for night sweats.  HENT:  Negative for mouth sores, mouth dryness and nose dryness.   Eyes:  Negative for redness and dryness.  Respiratory:  Negative for cough, hemoptysis, shortness of breath and difficulty breathing.   Cardiovascular:  Negative for palpitations,  hypertension, irregular heartbeat and swelling in legs/feet.  Gastrointestinal:  Positive for constipation (SE of iron supplement). Negative for diarrhea.  Endocrine: Negative for increased urination.  Genitourinary:  Negative for painful urination.  Musculoskeletal:  Positive for joint pain, joint pain, joint swelling and morning stiffness. Negative for myalgias, muscle weakness, muscle tenderness and myalgias.  Skin:  Negative for color change, rash, hair loss, nodules/bumps, skin tightness, ulcers and sensitivity to sunlight.  Allergic/Immunologic: Negative for susceptible to infections.  Neurological:  Positive for headaches. Negative for dizziness, fainting, memory loss, night sweats and weakness.  Hematological:  Negative for swollen glands.  Psychiatric/Behavioral:  Negative for depressed mood and sleep disturbance. The patient is not nervous/anxious.     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   Opioid dependence ([place]) 10/15/2018   Other long term (current) drug therapy 10/15/2018   Puncture wound of finger of left hand 08/08/2018   Puncture wound of finger, right, complicated, initial encounter 08/05/2018  DOE (dyspnea on exertion) 11/04/2017   Muscle cramps 07/04/2017   Contracture of left elbow 07/04/2017   High risk medication use 06/23/2017   Primary osteoarthritis of both hands 06/23/2017   Primary osteoarthritis of both feet 06/23/2017   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 factor ([place]) 06/23/2017    DDD (degenerative disc disease), cervical 06/23/2017   Rotator cuff syndrome of right shoulder 06/06/2017   History of  bilateral carpal tunnel release 06/06/2017   Ganglion cyst of wrist, left 06/06/2017   Non-insulin dependent type 2 diabetes mellitus ([place]) 06/06/2017   Chest pain with high risk for cardiac etiology 04/05/2017   COPD with acute exacerbation ([place]) 03/13/2017   Plantar fasciitis 02/21/2017   Iliac artery stenosis, right ([place]) 02/12/2017   Black stool 02/10/2017   Abnormal CT of the abdomen 02/10/2017   Cervical spondylosis 10/11/2016   Bony growth 10/13/2015   Pain in joint, lower leg 03/02/2014   Discoloration of skin-Left dorsum foot 09/28/2013   Swelling of limb-Left Calf / Leg 09/28/2013   PVD (peripheral vascular disease) ([place]) 10/14/2012   Pain in limb- Left popliteal and calf 10/14/2012   Dyslipidemia, goal LDL below 70    Essential hypertension 03/05/2012   Secondary cardiomyopathy, 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 Joya Salm   COPD (chronic obstructive pulmonary disease) ([place])    Coronary atherosclerosis of native coronary artery    Nonobstrtuctive by catheterization 2007, 2013, 2021   DVT (deep venous thrombosis) ([place])    Essential hypertension    GERD (gastroesophageal reflux disease)    Hyperlipidemia    PAD (peripheral artery disease) ([place])    s/p left fem-pop bypass 2007; PTA and  stenting of right iliac artery 09/2011   Rheumatoid arthritis(714.0)    Type 2 diabetes mellitus ([place])     Family History  Problem Relation Age of Onset   Heart attack Mother 48   Deep vein thrombosis Mother    Diabetes Mother    Hyperlipidemia Mother    Hypertension Mother    Peripheral vascular disease Mother    Stroke Father    Diabetes Father    Hyperlipidemia Father    Hypertension Father    Peripheral vascular disease Father    Diabetes Sister    Hyperlipidemia Sister    Hypertension Sister    Diabetes Brother    Hyperlipidemia Brother    Cancer Brother    Deep vein thrombosis Brother    Hypertension Brother    Peripheral vascular disease Brother    Hyperlipidemia Son    Cancer Son    Hypertension Son    Diabetes Daughter    Colon cancer Neg Hx    Past Surgical History:  Procedure Laterality Date   ABDOMINAL AORTAGRAM N/A 10/03/2011   Procedure: ABDOMINAL Maxcine Ham;  Surgeon: Conrad Cohoe, MD;  Location: [facility] CATH LAB;  Service: Cardiovascular;  Laterality: N/A;   ABDOMINAL AORTOGRAM W/LOWER EXTREMITY  N/A 01/16/2017   Procedure: ABDOMINAL AORTOGRAM W/LOWER EXTREMITY;  Surgeon: Conrad [place], MD;  Location: [facility];  Service: Cardiovascular;  Laterality: N/A;   ABDOMINAL AORTOGRAM W/LOWER EXTREMITY Bilateral 08/04/2019   Procedure: ABDOMINAL AORTOGRAM W/LOWER EXTREMITY;  Surgeon: Marty Heck, MD;  Location: [facility];  Service: Cardiovascular;  Laterality: Bilateral;   ABDOMINAL AORTOGRAM W/LOWER EXTREMITY N/A 03/09/2020   Procedure: ABDOMINAL AORTOGRAM W/LOWER EXTREMITY;  Surgeon: Marty Heck, MD;  Location: [facility];  Service: Cardiovascular;  Laterality: N/A;   CERVICAL DISC SURGERY     COLONOSCOPY  2013   Hyperplastic polyp   COLONOSCOPY WITH PROPOFOL N/A 02/17/2017   Surgeon: Daneil Dolin, MD; Many small and large mouth diverticula in the sigmoid, descending, transverse colon, internal hemorrhoids that were medium  sized and grade 1.   FEMORAL-POPLITEAL BYPASS GRAFT  11/06/2005   ILIAC ARTERY STENT  10/03/2011   Right CIA stenting   LEFT HEART CATH AND CORONARY ANGIOGRAPHY N/A 04/14/2019   Procedure: LEFT HEART CATH AND CORONARY ANGIOGRAPHY;  Surgeon: Troy Sine, MD;  Location: [facility];  Service: Cardiovascular;  Laterality: N/A;   PERIPHERAL VASCULAR BALLOON ANGIOPLASTY Right 01/16/2017   Procedure: PERIPHERAL VASCULAR BALLOON ANGIOPLASTY;  Surgeon: Conrad , MD;  Location: [facility];  Service: Cardiovascular;  Laterality: Right;  common iliac    PERIPHERAL VASCULAR BALLOON ANGIOPLASTY  03/09/2020   Procedure: PERIPHERAL VASCULAR BALLOON ANGIOPLASTY;  Surgeon: Marty Heck, MD;  Location: [facility];  Service: Cardiovascular;;   PR VEIN BYPASS GRAFT,AORTO-FEM-POP  06/22/2010   Redo Left Fem-Pop   Repair of left arm fracture     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    Immunization History  Administered Date(s) Administered   Influenza, High Dose Seasonal PF 10/14/2016, 09/21/2018   Influenza-Unspecified 11/08/2008, 12/10/2019   Moderna Sars-Covid-2 Vaccination 02/19/2019, 03/22/2019, 11/23/2019   PPD Test 09/22/2007   Td (Adult),5 Lf Tetanus Toxid, Preservative Free 02/09/2001   Tdap 02/09/2001, 08/05/2018     Objective: Vital Signs: BP 124/74 (BP Location: Left Arm, Patient Position: Sitting, Cuff Size: Normal)   Pulse 91   Resp 17   Ht '5\' 9"'$  (1.753 m)   Wt 168 lb 12.8 oz (76.6 kg)   BMI 24.93 kg/m    Physical Exam Vitals and nursing note reviewed.  Constitutional:      Appearance: He is well-developed.  HENT:     Head: Normocephalic and atraumatic.  Eyes:     Conjunctiva/sclera: Conjunctivae normal.     Pupils: Pupils are equal, round, and reactive to light.  Cardiovascular:     Rate and Rhythm: Normal rate and regular rhythm.      Heart sounds: Normal heart sounds.  Pulmonary:     Effort: Pulmonary effort is normal.     Breath sounds: Normal breath sounds.  Abdominal:     General: Bowel sounds are normal.     Palpations: Abdomen is soft.  Musculoskeletal:     Cervical back: Normal range of motion and neck supple.  Skin:    General: Skin is warm and dry.     Capillary Refill: Capillary refill takes less than 2 seconds.  Neurological:     Mental Status: He is alert and oriented to person, place, and time.  Psychiatric:  Behavior: Behavior normal.      Musculoskeletal Exam: C-spine has slightly limited ROM.  Left shoulder has limited abduction and internal rotation.  Bilateral elbow joint contractures.  Limited ROM of both wrist joints especially with extension of the left wrist. Thickening of both wrist joints and MCP joints. Limited extension of PIP joints.  Hip joints have slightly ROM.  Knee joints have good ROM with no warmth or effusion. Post-inflammatory hyperpigmentation over the dorsal aspect of the left foot. Warmth over the left ankle joint.  Right ankle has good ROM with no tenderness or synovitis.  No tenderness or synovitis over MTP joints. Hammertoes noted.    CDAI Exam: CDAI Score: -- Patient Global: --; Provider Global: -- Swollen: --; Tender: -- Joint Exam 12/11/2021   No joint exam has been documented for this visit   There is currently no information documented on the homunculus. Go to the Rheumatology activity and complete the homunculus joint exam.  Investigation: No additional findings.  Imaging: VAS [place] LOWER EXTREMITY ARTERIAL DUPLEX  Result Date: 11/13/2021 LOWER EXTREMITY ARTERIAL DUPLEX STUDY Patient Name:  ELIZABETH HAFF  Date of Exam:   11/13/2021 Medical Rec #: [id number]      Accession #:    [id number] Date of Birth: 1950-02-23     Patient Gender: M Patient Age:   35 years Exam Location:  [facility] Procedure:      VAS [place] LOWER EXTREMITY ARTERIAL DUPLEX  Referring Phys: Monica Martinez --------------------------------------------------------------------------------  Indications: Peripheral artery disease. High Risk Factors: Hypertension, hyperlipidemia, Diabetes, past history of                    smoking.  Vascular Interventions: 03/09/20 Right Mid SFA angioplasty, R Prox ATA angioplasty                         by Dr Carlis Abbott                         08/04/19 B/L LE angiogram by Dr Carlis Abbott                         2013: Right common iliac artery angioplasty and                         stent.                         2007: Left common femoral to above knee popliteal                         bypass. Current ABI:            Right [place] Left 1.23 Comparison Study: 05/15/21 Prior ABI and LT LE arterial duplex Performing Technologist: Elta Guadeloupe RVT, RDMS  Examination Guidelines: A complete evaluation includes B-mode imaging, spectral Doppler, color Doppler, and power Doppler as needed of all accessible portions of each vessel. Bilateral testing is considered an integral part of a complete examination. Limited examinations for reoccurring indications may be performed as noted.   Left Graft #1: Left femoral-popliteal artery bypass +--------------------+--------+--------+---------+--------+                     PSV cm/sStenosisWaveform Comments +--------------------+--------+--------+---------+--------+ Inflow              68  triphasic         +--------------------+--------+--------+---------+--------+ Proximal Anastomosis102             triphasic         +--------------------+--------+--------+---------+--------+ Proximal Graft      60              triphasic         +--------------------+--------+--------+---------+--------+ Mid Graft           56              triphasic         +--------------------+--------+--------+---------+--------+ Distal Graft        58              triphasic          +--------------------+--------+--------+---------+--------+ Distal Anastomosis  75              triphasic         +--------------------+--------+--------+---------+--------+ Outflow             63              triphasic         +--------------------+--------+--------+---------+--------+   Summary: Left: Patent Left femoral-popliteal bypass without evidence of stenosis.  See table(s) above for measurements and observations. Electronically signed by Monica Martinez MD on 11/13/2021 at 10:11:28 AM.    Final    VAS US AORTA/IVC/ILIACS  Result Date: 11/13/2021 ABDOMINAL AORTA STUDY Patient Name:  JAVANTE NILSSON  Date of Exam:   11/13/2021 Medical Rec #: [id number]      Accession #:    [id number] Date of Birth: 07-23-1950     Patient Gender: M Patient Age:   16 years Exam Location:  [facility] Procedure:      VAS US AORTA/IVC/ILIACS Referring Phys: Monica Martinez --------------------------------------------------------------------------------  Risk Factors: Hypertension, hyperlipidemia, Diabetes, past history of smoking. Vascular Interventions: 03/09/20 Right Mid SFA angioplasty, R Prox ATA angioplasty                         by Dr Carlis Abbott                         08/04/19 B/L LE angiogram by Dr Carlis Abbott                         2013: Right common iliac artery angioplasty and                         stent.                         2007: Left common femoral to above knee popliteal                         bypass. Limitations: Air/bowel gas.  Comparison Study: 05/15/21 Prior ABI                   01/30/21 Prior B/L LE arterial duplex Performing Technologist: Elta Guadeloupe RVT, RDMS  Examination Guidelines: A complete evaluation includes B-mode imaging, spectral Doppler, color Doppler, and power Doppler as needed of all accessible portions of each vessel. Bilateral testing is considered an integral part of a complete examination. Limited examinations for reoccurring indications may be performed as noted.   Abdominal Aorta Findings: +--------+-------+----------+----------+---------+--------+--------+ LocationAP (cm)Trans (cm)PSV (cm/s)Waveform ThrombusComments +--------+-------+----------+----------+---------+--------+--------+  Distal  1.94   1.80      65        triphasic                 +--------+-------+----------+----------+---------+--------+--------+ Right Stent(s): +---------------+-------+--------------+---------+-----------------------------+ RT Proximal CIAPSV    Stenosis      Waveform Comments                                     cm/s                                                        +---------------+-------+--------------+---------+-----------------------------+ Prox to Stent  65     1-49% stenosistriphasicShadowing from calcified                                                   plaque                        +---------------+-------+--------------+---------+-----------------------------+ Proximal Stent 167    1-49% stenosistriphasic                              +---------------+-------+--------------+---------+-----------------------------+ Mid Stent      187    1-49% stenosistriphasic                              +---------------+-------+--------------+---------+-----------------------------+ Distal Stent   178    1-49% stenosistriphasic                              +---------------+-------+--------------+---------+-----------------------------+ Distal to Stent72     1-49% stenosistriphasic                              +---------------+-------+--------------+---------+-----------------------------+    Summary: Stenosis: +------------------+-------------+--------------+ Location          Stenosis     Stent          +------------------+-------------+--------------+ Distal Aorta      <50% stenosis               +------------------+-------------+--------------+ Right Common Iliac             1-49% stenosis  +------------------+-------------+--------------+ Right CIA stent is patent.  *See table(s) above for measurements and observations.  Electronically signed by Monica Martinez MD on 11/13/2021 at 10:03:02 AM.    Final    VAS [place] ABI WITH/WO TBI  Result Date: 11/13/2021  LOWER EXTREMITY DOPPLER STUDY Patient Name:  HENDERSON FRAMPTON  Date of Exam:   11/13/2021 Medical Rec #: [id number]      Accession #:    [id number] Date of Birth: 1950/10/04     Patient Gender: M Patient Age:   48 years Exam Location:  [facility] Procedure:      VAS [place] ABI WITH/WO TBI Referring Phys: Monica Martinez --------------------------------------------------------------------------------  Indications: Peripheral artery disease. High Risk         Hypertension, hyperlipidemia,  Diabetes, past history of Factors:          smoking.  Vascular Interventions: 03/09/20 Right Mid SFA angioplasty, R Prox ATA angioplasty                         by Dr Carlis Abbott                         08/04/19 B/L LE angiogram by Dr Carlis Abbott                         2013: Right common iliac artery angioplasty and                         stent.                         2007: Left common femoral to above knee popliteal                         bypass. Comparison Study: 05/15/21 Prior ABI Performing Technologist: Elta Guadeloupe RVT, RDMS  Examination Guidelines: A complete evaluation includes at minimum, Doppler waveform signals and systolic blood pressure reading at the level of bilateral brachial, anterior tibial, and posterior tibial arteries, when vessel segments are accessible. Bilateral testing is considered an integral part of a complete examination. Photoelectric Plethysmograph (PPG) waveforms and toe systolic pressure readings are included as required and additional duplex testing as needed. Limited examinations for reoccurring indications may be performed as noted.  ABI Findings: +---------+------------------+-----+-----------+--------+ Right    Rt Pressure  (mmHg)IndexWaveform   Comment  +---------+------------------+-----+-----------+--------+ Brachial 133                                        +---------+------------------+-----+-----------+--------+ PTA      159               1.20 monophasic          +---------+------------------+-----+-----------+--------+ DP                              multiphasicCNO      +---------+------------------+-----+-----------+--------+ Great Toe64                0.48 Abnormal            +---------+------------------+-----+-----------+--------+ +---------+------------------+-----+----------+-------+ Left     Lt Pressure (mmHg)IndexWaveform  Comment +---------+------------------+-----+----------+-------+ Brachial 122                                      +---------+------------------+-----+----------+-------+ PTA      141               1.06 monophasic        +---------+------------------+-----+----------+-------+ DP       163               1.23 monophasic        +---------+------------------+-----+----------+-------+ Great Toe46                0.35 Abnormal          +---------+------------------+-----+----------+-------+ +-------+-----------+-----------+------------+------------+ ABI/TBIToday's ABIToday's TBIPrevious ABIPrevious TBI +-------+-----------+-----------+------------+------------+ Right  [place]  0.48       [place]          0.41         +-------+-----------+-----------+------------+------------+ Left   1.23       0.35       [place]          0.30         +-------+-----------+-----------+------------+------------+  Arterial wall calcification precludes accurate ankle pressures and ABIs. Bilateral ABIs and TBIs appear essentially unchanged compared to prior study on 05/15/21.  Summary: Right: Resting right ankle-brachial index indicates noncompressible right lower extremity arteries. The right toe-brachial index is abnormal. Left: Resting left ankle-brachial index is  within normal range. The left toe-brachial index is abnormal. Although ankle brachial indices are within normal limits (0.95-1.29), arterial Doppler waveforms at the ankle suggest some component of arterial occlusive disease. *See table(s) above for measurements and observations.  Electronically signed by Monica Martinez MD on 11/13/2021 at 9:27:32 AM.    Final     Recent Labs: Lab Results  Component Value Date   WBC 7.3 11/05/2021   HGB 10.6 (L) 11/05/2021   PLT 412 11/05/2021   NA 141 09/11/2021   K 4.5 09/11/2021   CL 106 09/11/2021   CO2 26 09/11/2021   GLUCOSE 80 09/11/2021   BUN 17 09/11/2021   CREATININE 1.07 09/11/2021   BILITOT 0.4 09/11/2021   ALKPHOS 79 01/25/2021   AST 17 09/11/2021   ALT 12 09/11/2021   PROT 7.4 09/11/2021   ALBUMIN 4.5 01/25/2021   CALCIUM 9.3 09/11/2021   GFRAA 83 01/27/2020   QFTBGOLDPLUS NEGATIVE 12/05/2020    Speciality Comments: Prior therapy includes: methotrexate ( d/c alcohol use), Arava (d/c patient preference), [place] (patient declined)  Procedures:  No procedures performed Allergies: Patient has no known allergies.   Assessment / Plan:     Visit Diagnoses: 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 factor [facility]): Patient has been experiencing intermittent discomfort in the left ankle and foot for the past 3 to 4 months.  He had no injury prior to the onset of symptoms.  On examination he continues to have postinflammatory hyperpigmentation on the dorsal aspect but has no obvious synovitis at this time.  He has tenderness along the left subtalar joint and overlying the dorsal aspect of his left foot currently.  Reviewed Dr. Heber Salida note from 10/17/21.  Patient has tried using Voltaren gel topically as needed for pain relief but has noticed minimal improvement.  He may benefit from a cortisone injection.  He has an upcoming appointment with Dr. Elisha Ponder on 12/19/21 at which time he plans on discussing possibly proceeding  with an injection for relief. Patient is prescribed Xeljanz 5 mg 1 tablet by mouth daily.  He occasionally forgets to take his dose of Xeljanz.  During the week of Thanksgiving he was traveling and did not take [place] while visiting family.  Discussed the importance of remaining compliant taking Morrie Sheldon as prescribed to prevent flares and further joint damage.  He voiced understanding.  He will notify us if his left foot pain persists or worsens or if he develops any other signs or symptoms of a flare.  Will continue to follow closely every 3 months or sooner if needed.  High risk medication use - Xeljanz 5 mg 1 tablet by mouth daily.  Methotrexate discontinued due to alcohol use, Orencia declined by patient, treated with Morrie Sheldon [facility] at [facility].  - Plan: QuantiFERON-TB Gold Plus CBC and CMP were drawn on 11/07/2021.  His next lab work will be due in February and every 3 months to monitor for drug toxicity. TB Gold negative on 12/05/2020.  Order for TB gold released today. Discussed the importance of holding Morrie Sheldon if he develops signs or symptoms of an infection and to resume once the infection has completely cleared. Medication counseling: Counseled on the increased risk of DVT/PE-discussed FDA black box warning.   Screening for tuberculosis -Order for TB gold released today.  Plan: QuantiFERON-TB Gold Plus  Rotator cuff syndrome of right shoulder: Chronic pain and stiffness.  Contracture of joint of both elbows: Unchanged.  No tenderness or inflammation noted.  Primary osteoarthritis of both hands: PIP and DIP thickening consistent with osteoarthritis noted.  No active inflammation noted on examination today.  History of  bilateral carpal tunnel release: Currently asymptomatic.  Chronic pain of left knee: No warmth or effusion.   Primary osteoarthritis of both feet: Right ankle joint has good range of motion with no tenderness or synovitis.  He has been experiencing intermittent discomfort  in the left foot.  He had no injury prior to the onset of symptoms.  On examination postinflammatory hyperpigmentation of the skin was noted.  He has some tenderness along the subtalar joint and on the dorsal aspect of the left foot.  Different treatment options were discussed today.  He has an upcoming appointment with his podiatrist Dr. Elisha Ponder at which time he plans on discussing a cortisone injection.  Pain in left ankle and joints of left foot: He has been experiencing intermittent pain in the left ankle joint for the past 3 to 4 months.  X-rays of the left foot were obtained on 08/14/2021. No recent injury or fall. He has tried using Voltaren gel with minimal relief.  Patient is scheduled for a follow-up visit with Dr. Elisha Ponder on 12/19/21 for further evaluation and management.    DDD (degenerative disc disease), cervical: C-spine has limited range of motion with lateral rotation.  No symptoms of radiculopathy.  Other medical conditions are listed as follows:  Mixed hyperlipidemia  Essential hypertension: Blood pressure was 124/74 today in the office.  COPD  GOLD II  Atherosclerosis of native coronary artery of native heart without angina pectoris  History of diabetes mellitus  PVD (peripheral vascular disease) ([place])    Orders: Orders Placed This Encounter  Procedures   QuantiFERON-TB Gold Plus   No orders of the defined types were placed in this encounter.     Follow-Up Instructions: Return in about 3 months (around 03/12/2022) for Rheumatoid arthritis, Osteoarthritis.   Ofilia Neas, PA-C  Note - This record has been created using Dragon software.  Chart creation errors have been sought, but may not always  have been located. Such creation errors do not reflect on  the standard of medical care.

## 2021-12-04 ENCOUNTER — Other Ambulatory Visit: Payer: Self-pay | Admitting: Internal Medicine

## 2021-12-05 DIAGNOSIS — I10 Essential (primary) hypertension: Secondary | ICD-10-CM | POA: Diagnosis not present

## 2021-12-05 DIAGNOSIS — E1165 Type 2 diabetes mellitus with hyperglycemia: Secondary | ICD-10-CM | POA: Diagnosis not present

## 2021-12-05 NOTE — Telephone Encounter (Signed)
Pt called wanting to schedule his procedure. Advised pt haven't heard by from provider for him to be able to hold Plavix. Pt states he is going to call his provider because they had told him they would fax clearance over.

## 2021-12-06 ENCOUNTER — Telehealth: Payer: Self-pay

## 2021-12-06 NOTE — Telephone Encounter (Signed)
Pt called stating that he has an upcoming procedure and needs to know if he is supposed to stop his medications.  Reviewed pt's chart, returned call for clarification, no answer, lf vm on both home and cell numbers.

## 2021-12-07 ENCOUNTER — Telehealth: Payer: Self-pay

## 2021-12-07 NOTE — Telephone Encounter (Signed)
Pt called stating he is trying to schedule a colonoscopy appt and is waiting on medication clearance from our office. Per MD, pt can hold Plavix x 5 days for this. I have attempted to reach office to let them know this and left a VM for them at [phone].

## 2021-12-10 ENCOUNTER — Telehealth: Payer: Self-pay

## 2021-12-10 NOTE — Telephone Encounter (Signed)
Will email monitor results from [facility] to you

## 2021-12-10 NOTE — Telephone Encounter (Signed)
Patient walked into the office for heart monitor results. Will fwd to provider for review.

## 2021-12-11 ENCOUNTER — Telehealth: Payer: Self-pay | Admitting: Pharmacist

## 2021-12-11 ENCOUNTER — Ambulatory Visit: Payer: Medicare Other | Attending: Physician Assistant | Admitting: Physician Assistant

## 2021-12-11 ENCOUNTER — Encounter: Payer: Self-pay | Admitting: *Deleted

## 2021-12-11 ENCOUNTER — Encounter: Payer: Self-pay | Admitting: Physician Assistant

## 2021-12-11 VITALS — BP 124/74 | HR 91 | Resp 17 | Ht 69.0 in | Wt 168.8 lb

## 2021-12-11 DIAGNOSIS — M24521 Contracture, right elbow: Secondary | ICD-10-CM

## 2021-12-11 DIAGNOSIS — G8929 Other chronic pain: Secondary | ICD-10-CM

## 2021-12-11 DIAGNOSIS — M0609 Rheumatoid arthritis without rheumatoid factor, multiple sites: Secondary | ICD-10-CM | POA: Diagnosis not present

## 2021-12-11 DIAGNOSIS — M5030 Other cervical disc degeneration, unspecified cervical region: Secondary | ICD-10-CM | POA: Diagnosis not present

## 2021-12-11 DIAGNOSIS — I10 Essential (primary) hypertension: Secondary | ICD-10-CM

## 2021-12-11 DIAGNOSIS — M25572 Pain in left ankle and joints of left foot: Secondary | ICD-10-CM | POA: Diagnosis not present

## 2021-12-11 DIAGNOSIS — M19042 Primary osteoarthritis, left hand: Secondary | ICD-10-CM

## 2021-12-11 DIAGNOSIS — Z8639 Personal history of other endocrine, nutritional and metabolic disease: Secondary | ICD-10-CM

## 2021-12-11 DIAGNOSIS — Z9889 Other specified postprocedural states: Secondary | ICD-10-CM

## 2021-12-11 DIAGNOSIS — E782 Mixed hyperlipidemia: Secondary | ICD-10-CM

## 2021-12-11 DIAGNOSIS — M25562 Pain in left knee: Secondary | ICD-10-CM

## 2021-12-11 DIAGNOSIS — M19041 Primary osteoarthritis, right hand: Secondary | ICD-10-CM

## 2021-12-11 DIAGNOSIS — Z111 Encounter for screening for respiratory tuberculosis: Secondary | ICD-10-CM

## 2021-12-11 DIAGNOSIS — Z79899 Other long term (current) drug therapy: Secondary | ICD-10-CM

## 2021-12-11 DIAGNOSIS — M24522 Contracture, left elbow: Secondary | ICD-10-CM

## 2021-12-11 DIAGNOSIS — M75101 Unspecified rotator cuff tear or rupture of right shoulder, not specified as traumatic: Secondary | ICD-10-CM

## 2021-12-11 DIAGNOSIS — M19072 Primary osteoarthritis, left ankle and foot: Secondary | ICD-10-CM

## 2021-12-11 DIAGNOSIS — M19071 Primary osteoarthritis, right ankle and foot: Secondary | ICD-10-CM

## 2021-12-11 DIAGNOSIS — J449 Chronic obstructive pulmonary disease, unspecified: Secondary | ICD-10-CM

## 2021-12-11 DIAGNOSIS — I739 Peripheral vascular disease, unspecified: Secondary | ICD-10-CM

## 2021-12-11 DIAGNOSIS — I2510 Atherosclerotic heart disease of native coronary artery without angina pectoris: Secondary | ICD-10-CM

## 2021-12-11 MED ORDER — PEG 3350-KCL-NA BICARB-NACL 420 G PO SOLR: 4000.0000 mL | Freq: Once | ORAL | 0 refills | Status: AC

## 2021-12-11 NOTE — Telephone Encounter (Signed)
Received notification from [facility] regarding a prior authorization for [facility]. Authorization has been APPROVED from 12/11/2021 to 01/07/2023. Approval letter sent to scan center.  Patient can continue to fill through [facility] # [id number]   Knox Saliva, PharmD, MPH, BCPS, CPP Clinical Pharmacist (Rheumatology and Pulmonology)

## 2021-12-11 NOTE — Telephone Encounter (Signed)
Pt has been scheduled for 12/28/21 at 10:45 am with Dr.Carver. Pt to come by office to pick up instructions and pre-op date.

## 2021-12-11 NOTE — Patient Instructions (Addendum)
Standing Labs We placed an order today for your standing lab work.   Please have your standing labs drawn in February and every 3 months   Please have your labs drawn 2 weeks prior to your appointment so that the provider can discuss your lab results at your appointment.  Please note that you may see your imaging and lab results in MyChart before we have reviewed them. We will contact you once all results are reviewed. Please allow our office up to 72 hours to thoroughly review all of the results before contacting the office for clarification of your results.  Lab hours are:   Monday through Thursday from 8:00 am -12:30 pm and 1:00 pm-5:00 pm and Friday from 8:00 am-12:00 pm.  Please be advised, all patients with office appointments requiring lab work will take precedent over walk-in lab work.   Labs are drawn by [facility]. Please bring your co-pay at the time of your lab draw.  You may receive a bill from [facility] for your lab work.  Please note if you are on Hydroxychloroquine and and an order has been placed for a Hydroxychloroquine level, you will need to have it drawn 4 hours or more after your last dose.  If you wish to have your labs drawn at another location, please call the office 24 hours in advance so we can fax the orders.  The office is located at [address], [address], [place], [place] [address] No appointment is necessary.    If you have any questions regarding directions or hours of operation,  please call [phone].   As a reminder, please drink plenty of water prior to coming for your lab work. Thanks!  If you have signs or symptoms of an infection or start antibiotics: First, call your PCP for workup of your infection. Hold your medication through the infection, until you complete your antibiotics, and until symptoms resolve if you take the following: Injectable medication (Actemra, Benlysta, Cimzia, Cosentyx, Enbrel, Humira, Kevzara, Orencia, Remicade, Simponi,  Stelara, Taltz, Tremfya) Methotrexate Leflunomide (Arava) Mycophenolate (Cellcept) Xeljanz, Olumiant, or Rinvoq Vaccines You are taking a medication(s) that can suppress your immune system.  The following immunizations are recommended: Flu annually Covid-19  Td/Tdap (tetanus, diphtheria, pertussis) every 10 years Pneumonia (Prevnar 15 then Pneumovax 23 at least 1 year apart.  Alternatively, can take Prevnar 20 without needing additional dose) Shingrix: 2 doses from 4 weeks to 6 months apart  Please check with your PCP to make sure you are up to date.   

## 2021-12-11 NOTE — Telephone Encounter (Signed)
Submitted a Prior Authorization renewal request to [name] for [facility] via CoverMyMeds. Will update once we receive a response.  Key: [id number]  Knox Saliva, PharmD, MPH, BCPS, CPP Clinical Pharmacist (Rheumatology and Pulmonology)

## 2021-12-11 NOTE — Telephone Encounter (Signed)
LMOVM to return call.

## 2021-12-11 NOTE — Telephone Encounter (Signed)
[employer] PA:  APPROVED Authorization #: [id number]  DOS: 12/28/21-01/06/22

## 2021-12-12 DIAGNOSIS — J449 Chronic obstructive pulmonary disease, unspecified: Secondary | ICD-10-CM | POA: Diagnosis not present

## 2021-12-12 DIAGNOSIS — I700 Atherosclerosis of aorta: Secondary | ICD-10-CM | POA: Diagnosis not present

## 2021-12-12 DIAGNOSIS — Z299 Encounter for prophylactic measures, unspecified: Secondary | ICD-10-CM | POA: Diagnosis not present

## 2021-12-12 DIAGNOSIS — Z6824 Body mass index (BMI) 24.0-24.9, adult: Secondary | ICD-10-CM | POA: Diagnosis not present

## 2021-12-12 DIAGNOSIS — M069 Rheumatoid arthritis, unspecified: Secondary | ICD-10-CM | POA: Diagnosis not present

## 2021-12-12 DIAGNOSIS — I10 Essential (primary) hypertension: Secondary | ICD-10-CM | POA: Diagnosis not present

## 2021-12-12 DIAGNOSIS — E1165 Type 2 diabetes mellitus with hyperglycemia: Secondary | ICD-10-CM | POA: Diagnosis not present

## 2021-12-13 LAB — QUANTIFERON-TB GOLD PLUS
Mitogen-NIL: >10 IU/mL
NIL: 0.01 IU/mL
QuantiFERON-TB Gold Plus: NEGATIVE
TB1-NIL: 0.01 IU/mL
TB2-NIL: 0.01 IU/mL

## 2021-12-13 NOTE — Progress Notes (Signed)
TB gold negative

## 2021-12-19 ENCOUNTER — Ambulatory Visit (INDEPENDENT_AMBULATORY_CARE_PROVIDER_SITE_OTHER): Payer: Medicare Other | Admitting: Podiatry

## 2021-12-19 ENCOUNTER — Encounter: Payer: Self-pay | Admitting: Podiatry

## 2021-12-19 VITALS — BP 138/79 | HR 89

## 2021-12-19 DIAGNOSIS — B351 Tinea unguium: Secondary | ICD-10-CM | POA: Diagnosis not present

## 2021-12-19 DIAGNOSIS — M79675 Pain in left toe(s): Secondary | ICD-10-CM | POA: Diagnosis not present

## 2021-12-19 DIAGNOSIS — M79674 Pain in right toe(s): Secondary | ICD-10-CM | POA: Diagnosis not present

## 2021-12-19 DIAGNOSIS — E1159 Type 2 diabetes mellitus with other circulatory complications: Secondary | ICD-10-CM | POA: Diagnosis not present

## 2021-12-19 NOTE — Progress Notes (Signed)
  Subjective:  Patient ID: [name], male    DOB: [birth year],  MRN: [id number]  [name] presents to clinic today for at risk foot care. Pt has h/o NIDDM with PAD and painful thick toenails that are difficult to trim. Pain interferes with ambulation. Aggravating factors include wearing enclosed shoe gear. Pain is relieved with periodic professional debridement.  Chief Complaint  Patient presents with   Diabetes    Diabetic foot care, A1c- 8.9 BG- 132, last seen pcp a week ago    New problem(s): None.   Patient states he is scheduled to have a colonoscopy next Friday due to some bleeding he is having. He has seen Rheumatology for his left foot as well and states   PCP is Vyas, Dhruv B, MD.  No Known Allergies  Review of Systems: Negative except as noted in the HPI.  Objective: No changes noted in today's physical examination. Vitals:   12/19/21 0816  BP: 138/79  Pulse: 89   [name] is a pleasant 71 y.o. male WD, WN in NAD. AAO x 3.  Vascular Examination: CFT <5 seconds b/l LE. Diminished pedal pulse(s) b/l LE.  No pain with calf compression b/l. Lower extremity skin temperature gradient warm to warm b/l. Trace edema noted LLE. No ischemia or gangrene noted b/l LE. No cyanosis or clubbing noted b/l LE.  Dermatological Examination: Pedal skin thin, shiny and atrophic b/l LE. No open wounds b/l LE. No interdigital macerations noted b/l LE.   Toenails 1-5 bilaterally elongated, discolored, dystrophic, thickened, and crumbly with subungual debris and tenderness to dorsal palpation.   No hyperkeratotic nor porokeratotic lesions present on today's visit.   Residual diffuse scaling noted peripherally and plantarly b/l feet.  No interdigital macerations.  No blisters, no weeping. No signs of secondary bacterial infection noted. No breaks in skin.  Musculoskeletal Examination: Muscle strength 5/5 to all lower extremity muscle groups bilaterally. HAV with bunion deformity  noted b/l LE. Hammertoe deformity noted 2-5 b/l.   Bony prominence noted dorsal midfoot joint LLE.  Neurological Examination: Pt has subjective symptoms of neuropathy. Protective sensation intact 5/5 intact bilaterally with 10g monofilament b/l. Vibratory sensation intact b/l.  Assessment/Plan: 1. Pain due to onychomycosis of toenails of both feet   2. Type 2 diabetes mellitus with vascular disease ([place])     No orders of the defined types were placed in this encounter.  -Patient was evaluated and treated. All patient's and/or POA's questions/concerns answered on today's visit. -Mr. Dohrman is followed closely by Vascular team for his PAD. -Continue foot and shoe inspections daily. Monitor blood glucose per PCP/Endocrinologist's recommendations. -Patient to continue soft, supportive shoe gear daily. -Toenails 1-5 b/l were debrided in length and girth with sterile nail nippers and dremel without iatrogenic bleeding.  -Patient/POA to call should there be question/concern in the interim.   Return in about 3 months (around 03/20/2022).  Marzetta Board, DPM

## 2021-12-24 ENCOUNTER — Telehealth: Payer: Self-pay | Admitting: *Deleted

## 2021-12-24 NOTE — Telephone Encounter (Signed)
Pt left vm that he had some questions about his upcoming procedure.   [name]

## 2021-12-26 ENCOUNTER — Other Ambulatory Visit: Payer: Self-pay

## 2021-12-26 ENCOUNTER — Encounter (HOSPITAL_COMMUNITY)
Admission: RE | Admit: 2021-12-26 | Discharge: 2021-12-26 | Disposition: A | Discharge status: Home / Self Care | Payer: Medicare Other | Source: Ambulatory Visit | Attending: Internal Medicine | Admitting: Internal Medicine

## 2021-12-26 ENCOUNTER — Encounter (HOSPITAL_COMMUNITY): Payer: Self-pay

## 2021-12-27 ENCOUNTER — Telehealth: Payer: Self-pay | Admitting: Cardiology

## 2021-12-27 NOTE — Telephone Encounter (Signed)
Patient is requesting a call back to discuss monitor results. 

## 2021-12-27 NOTE — Telephone Encounter (Signed)
Spoke to patient- monitor was lost in report- found and sent to provider to result.

## 2021-12-28 ENCOUNTER — Ambulatory Visit (HOSPITAL_COMMUNITY)
Admission: RE | Admit: 2021-12-28 | Discharge: 2021-12-28 | Disposition: A | Discharge status: Home / Self Care | Payer: Medicare Other | Source: Ambulatory Visit | Attending: Internal Medicine | Admitting: Internal Medicine

## 2021-12-28 ENCOUNTER — Encounter (HOSPITAL_COMMUNITY)
Admission: RE | Disposition: A | Discharge status: Home / Self Care | Payer: Self-pay | Source: Ambulatory Visit | Attending: Internal Medicine

## 2021-12-28 ENCOUNTER — Ambulatory Visit (HOSPITAL_BASED_OUTPATIENT_CLINIC_OR_DEPARTMENT_OTHER): Payer: Medicare Other | Admitting: Anesthesiology

## 2021-12-28 ENCOUNTER — Telehealth: Payer: Self-pay | Admitting: Internal Medicine

## 2021-12-28 ENCOUNTER — Ambulatory Visit (HOSPITAL_COMMUNITY): Payer: Medicare Other | Admitting: Anesthesiology

## 2021-12-28 ENCOUNTER — Encounter (HOSPITAL_COMMUNITY): Payer: Self-pay

## 2021-12-28 DIAGNOSIS — R1310 Dysphagia, unspecified: Secondary | ICD-10-CM | POA: Insufficient documentation

## 2021-12-28 DIAGNOSIS — K2970 Gastritis, unspecified, without bleeding: Secondary | ICD-10-CM | POA: Diagnosis not present

## 2021-12-28 DIAGNOSIS — I2510 Atherosclerotic heart disease of native coronary artery without angina pectoris: Secondary | ICD-10-CM | POA: Insufficient documentation

## 2021-12-28 DIAGNOSIS — Z7984 Long term (current) use of oral hypoglycemic drugs: Secondary | ICD-10-CM | POA: Insufficient documentation

## 2021-12-28 DIAGNOSIS — K6389 Other specified diseases of intestine: Secondary | ICD-10-CM | POA: Diagnosis not present

## 2021-12-28 DIAGNOSIS — D490 Neoplasm of unspecified behavior of digestive system: Secondary | ICD-10-CM

## 2021-12-28 DIAGNOSIS — K219 Gastro-esophageal reflux disease without esophagitis: Secondary | ICD-10-CM | POA: Diagnosis not present

## 2021-12-28 DIAGNOSIS — I10 Essential (primary) hypertension: Secondary | ICD-10-CM | POA: Diagnosis not present

## 2021-12-28 DIAGNOSIS — Z86718 Personal history of other venous thrombosis and embolism: Secondary | ICD-10-CM | POA: Diagnosis not present

## 2021-12-28 DIAGNOSIS — Z7902 Long term (current) use of antithrombotics/antiplatelets: Secondary | ICD-10-CM | POA: Diagnosis not present

## 2021-12-28 DIAGNOSIS — K2270 Barrett's esophagus without dysplasia: Secondary | ICD-10-CM | POA: Diagnosis not present

## 2021-12-28 DIAGNOSIS — E119 Type 2 diabetes mellitus without complications: Secondary | ICD-10-CM | POA: Diagnosis not present

## 2021-12-28 DIAGNOSIS — K2960 Other gastritis without bleeding: Secondary | ICD-10-CM | POA: Diagnosis not present

## 2021-12-28 DIAGNOSIS — K2289 Other specified disease of esophagus: Secondary | ICD-10-CM

## 2021-12-28 DIAGNOSIS — D509 Iron deficiency anemia, unspecified: Secondary | ICD-10-CM

## 2021-12-28 DIAGNOSIS — K5730 Diverticulosis of large intestine without perforation or abscess without bleeding: Secondary | ICD-10-CM | POA: Insufficient documentation

## 2021-12-28 DIAGNOSIS — R0609 Other forms of dyspnea: Secondary | ICD-10-CM | POA: Diagnosis not present

## 2021-12-28 DIAGNOSIS — B9681 Helicobacter pylori [H. pylori] as the cause of diseases classified elsewhere: Secondary | ICD-10-CM | POA: Diagnosis not present

## 2021-12-28 DIAGNOSIS — J449 Chronic obstructive pulmonary disease, unspecified: Secondary | ICD-10-CM | POA: Insufficient documentation

## 2021-12-28 DIAGNOSIS — Z833 Family history of diabetes mellitus: Secondary | ICD-10-CM | POA: Diagnosis not present

## 2021-12-28 DIAGNOSIS — K31A19 Gastric intestinal metaplasia without dysplasia, unspecified site: Secondary | ICD-10-CM | POA: Diagnosis not present

## 2021-12-28 DIAGNOSIS — I739 Peripheral vascular disease, unspecified: Secondary | ICD-10-CM | POA: Diagnosis not present

## 2021-12-28 DIAGNOSIS — M069 Rheumatoid arthritis, unspecified: Secondary | ICD-10-CM | POA: Diagnosis not present

## 2021-12-28 DIAGNOSIS — E1151 Type 2 diabetes mellitus with diabetic peripheral angiopathy without gangrene: Secondary | ICD-10-CM | POA: Diagnosis not present

## 2021-12-28 DIAGNOSIS — K449 Diaphragmatic hernia without obstruction or gangrene: Secondary | ICD-10-CM

## 2021-12-28 DIAGNOSIS — K2961 Other gastritis with bleeding: Secondary | ICD-10-CM | POA: Diagnosis not present

## 2021-12-28 DIAGNOSIS — Z87891 Personal history of nicotine dependence: Secondary | ICD-10-CM | POA: Insufficient documentation

## 2021-12-28 HISTORY — PX: COLONOSCOPY WITH PROPOFOL: SHX5780

## 2021-12-28 HISTORY — PX: BIOPSY: SHX5522

## 2021-12-28 HISTORY — PX: ESOPHAGOGASTRODUODENOSCOPY (EGD) WITH PROPOFOL: SHX5813

## 2021-12-28 LAB — CBC WITH DIFFERENTIAL/PLATELET
Abs Immature Granulocytes: 0.01 10*3/uL (ref 0.00–0.07)
Basophils Absolute: 0 10*3/uL (ref 0.0–0.1)
Basophils Relative: 1 %
Eosinophils Absolute: 0.1 10*3/uL (ref 0.0–0.5)
Eosinophils Relative: 1 %
HCT: 39.3 % (ref 39.0–52.0)
Hemoglobin: 11.8 g/dL — ABNORMAL LOW (ref 13.0–17.0)
Immature Granulocytes: 0 %
Lymphocytes Relative: 23 %
Lymphs Abs: 1 10*3/uL (ref 0.7–4.0)
MCH: 25.8 pg — ABNORMAL LOW (ref 26.0–34.0)
MCHC: 30 g/dL (ref 30.0–36.0)
MCV: 85.8 fL (ref 80.0–100.0)
Monocytes Absolute: 0.3 10*3/uL (ref 0.1–1.0)
Monocytes Relative: 6 %
Neutro Abs: 3 10*3/uL (ref 1.7–7.7)
Neutrophils Relative %: 69 %
Platelets: 306 10*3/uL (ref 150–400)
RBC: 4.58 MIL/uL (ref 4.22–5.81)
RDW: 19.8 % — ABNORMAL HIGH (ref 11.5–15.5)
WBC: 4.4 10*3/uL (ref 4.0–10.5)
nRBC: 0 % (ref 0.0–0.2)

## 2021-12-28 LAB — GLUCOSE, CAPILLARY: Glucose-Capillary: 131 mg/dL — ABNORMAL HIGH (ref 70–99)

## 2021-12-28 SURGERY — COLONOSCOPY WITH PROPOFOL
Anesthesia: General

## 2021-12-28 MED ORDER — PROPOFOL 10 MG/ML IV BOLUS
INTRAVENOUS | Status: DC | PRN
  Administered 2021-12-28: 30 mg via INTRAVENOUS

## 2021-12-28 MED ORDER — LIDOCAINE HCL (CARDIAC) PF 100 MG/5ML IV SOSY
PREFILLED_SYRINGE | INTRAVENOUS | Status: DC | PRN
  Administered 2021-12-28: 60 mg via INTRAVENOUS

## 2021-12-28 MED ORDER — LACTATED RINGERS IV SOLN: INTRAVENOUS | Status: DC | PRN

## 2021-12-28 MED ORDER — PROPOFOL 500 MG/50ML IV EMUL
INTRAVENOUS | Status: DC | PRN
  Administered 2021-12-28: 150 ug/kg/min via INTRAVENOUS

## 2021-12-28 NOTE — Op Note (Addendum)
[facility] Patient Name: [name] Procedure Date: 12/28/2021 9:30 AM MRN: [id number] Date of Birth: [birth year] Attending MD: Elon Alas. Edgar Frisk, [id number] CSN: [id number] Age: 71 Admit Type: Outpatient Procedure:                Colonoscopy Indications:              Iron deficiency anemia Providers:                Elon Alas. Abbey Chatters, DO, Lurline Del, RN, Everardo Pacific Referring MD:              Medicines:                See the Anesthesia note for documentation of the                            administered medications Complications:            No immediate complications. Estimated Blood Loss:     Estimated blood loss: none. Procedure:                Pre-Anesthesia Assessment:                           - The anesthesia plan was to use monitored                            anesthesia care (MAC).                           After obtaining informed consent, the colonoscope                            was passed under direct vision. Throughout the                            procedure, the patient's blood pressure, pulse, and                            oxygen saturations were monitored continuously. The                            [id number]-HQ190L ([id number]) scope was introduced through                            the anus and advanced to the the cecum, identified                            by appendiceal orifice and ileocecal valve. The                            colonoscopy was performed without difficulty. The                            patient tolerated the procedure well. The quality  of the bowel preparation was evaluated using the                            BBPS [facility] Bowel Preparation Scale) with scores                            of: Right Colon = 2 (minor amount of residual                            staining, small fragments of stool and/or opaque                            liquid, but mucosa seen well), Transverse  Colon = 3                            (entire mucosa seen well with no residual staining,                            small fragments of stool or opaque liquid) and Left                            Colon = 3 (entire mucosa seen well with no residual                            staining, small fragments of stool or opaque                            liquid). The total BBPS score equals 8. The quality                            of the bowel preparation was good. Scope In: 10:02:06 AM Scope Out: 10:15:21 AM Scope Withdrawal Time: 0 hours 6 minutes 51 seconds  Total Procedure Duration: 0 hours 13 minutes 15 seconds  Findings:      The perianal and digital rectal examinations were normal.      Multiple large-mouthed and small-mouthed diverticula were found in the       sigmoid colon and descending colon.      A submucosal non-obstructing medium-sized mass was found in the       ascending colon. The mass was non-circumferential. In addition, its       diameter measured 3 cm. No bleeding was present. Impression:               - Diverticulosis in the sigmoid colon and in the                            descending colon.                           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                           - No specimens collected. Moderate Sedation:      Per Anesthesia Care Recommendation:           -  Patient has a contact number available for                            emergencies. The signs and symptoms of potential                            delayed complications were discussed with the                            patient. Return to normal activities tomorrow.                            Written discharge instructions were provided to the                            patient.                           - Resume previous diet.                           - Continue present medications.                           - Repeat colonoscopy in 10 years for screening                             purposes.                           - Return to GI clinic in 2 months.                           - Consider capsule endoscopy for completeness for                            anemia.                           - Will order CT to further evaluate large                            submucosal tumor in ascending colon Procedure Code(s):        --- Professional ---                           [phone], Colonoscopy, flexible; diagnostic, including                            collection of specimen(s) by brushing or washing,                            when performed (separate procedure) Diagnosis Code(s):        --- Professional ---                           V37.10,  Other specified diseases of intestine                           D50.9, Iron deficiency anemia, unspecified                           K57.30, Diverticulosis of large intestine without                            perforation or abscess without bleeding CPT copyright 2022 American Medical Association. All rights reserved. The codes documented in this report are preliminary and upon coder review may  be revised to meet current compliance requirements. Elon Alas. Abbey Chatters, DO [name] Abbey Chatters, DO 12/28/2021 10:37:14 AM This report has been signed electronically. Number of Addenda: 0

## 2021-12-28 NOTE — Anesthesia Preprocedure Evaluation (Signed)
Anesthesia Evaluation  Patient identified by MRN, date of birth, ID band Patient awake    Reviewed: Allergy & Precautions, H&P , NPO status , Patient's Chart, lab work & pertinent test results, reviewed documented beta blocker date and time   Airway Mallampati: II  TM Distance: >3 FB Neck ROM: full    Dental no notable dental hx.    Pulmonary COPD, former smoker   Pulmonary exam normal breath sounds clear to auscultation       Cardiovascular Exercise Tolerance: Good hypertension, + CAD, + Peripheral Vascular Disease and + DOE   Rhythm:regular Rate:Normal     Neuro/Psych  Neuromuscular disease  negative psych ROS   GI/Hepatic Neg liver ROS,GERD  Medicated,,  Endo/Other  negative endocrine ROSdiabetes, Type 2    Renal/GU negative Renal ROS  negative genitourinary   Musculoskeletal   Abdominal   Peds  Hematology  (+) Blood dyscrasia, anemia   Anesthesia Other Findings   Reproductive/Obstetrics negative OB ROS                             Anesthesia Physical Anesthesia Plan  ASA: 3  Anesthesia Plan: General   Post-op Pain Management:    Induction:   PONV Risk Score and Plan: Propofol infusion  Airway Management Planned:   Additional Equipment:   Intra-op Plan:   Post-operative Plan:   Informed Consent: I have reviewed the patients History and Physical, chart, labs and discussed the procedure including the risks, benefits and alternatives for the proposed anesthesia with the patient or authorized representative who has indicated his/her understanding and acceptance.     Dental Advisory Given  Plan Discussed with: CRNA  Anesthesia Plan Comments:        Anesthesia Quick Evaluation

## 2021-12-28 NOTE — H&P (Signed)
Primary Care Physician:  [name], MD Primary Gastroenterologist:  Dr. Abbey Chatters  Pre-Procedure History & Physical: HPI:  [name] is a 71 y.o. male is here for an EGD with possible dilation due to chronic GERD, dysphagia, and colonoscopy due to iron deficiency anemia.  Past Medical History:  Diagnosis Date   Anemia    Bronchitis    Carotid artery disease ([place])    Cervical spine disease    Neck surgery in 2007 with Dr. Joya Salm   COPD (chronic obstructive pulmonary disease) ([place])    Coronary atherosclerosis of native coronary artery    Nonobstrtuctive by catheterization 2007, 2013, 2021   DVT (deep venous thrombosis) ([place])    Essential hypertension    GERD (gastroesophageal reflux disease)    Hyperlipidemia    PAD (peripheral artery disease) ([place])    s/p left fem-pop bypass 2007; PTA and stenting of right iliac artery 09/2011   Rheumatoid arthritis(714.0)    Type 2 diabetes mellitus ([place])     Past Surgical History:  Procedure Laterality Date   ABDOMINAL AORTAGRAM N/A 10/03/2011   Procedure: ABDOMINAL Maxcine Ham;  Surgeon: Conrad [place], MD;  Location: [facility] CATH LAB;  Service: Cardiovascular;  Laterality: N/A;   ABDOMINAL AORTOGRAM W/LOWER EXTREMITY N/A 01/16/2017   Procedure: ABDOMINAL AORTOGRAM W/LOWER EXTREMITY;  Surgeon: Conrad [place], MD;  Location: [place] CV LAB;  Service: Cardiovascular;  Laterality: N/A;   ABDOMINAL AORTOGRAM W/LOWER EXTREMITY Bilateral 08/04/2019   Procedure: ABDOMINAL AORTOGRAM W/LOWER EXTREMITY;  Surgeon: Marty Heck, MD;  Location: [place] CV LAB;  Service: Cardiovascular;  Laterality: Bilateral;   ABDOMINAL AORTOGRAM W/LOWER EXTREMITY N/A 03/09/2020   Procedure: ABDOMINAL AORTOGRAM W/LOWER EXTREMITY;  Surgeon: Marty Heck, MD;  Location: [place] CV LAB;  Service: Cardiovascular;  Laterality: N/A;   CERVICAL DISC SURGERY     COLONOSCOPY  2013   Hyperplastic polyp   COLONOSCOPY WITH PROPOFOL N/A 02/17/2017   Surgeon:  Daneil Dolin, MD; Many small and large mouth diverticula in the sigmoid, descending, transverse colon, internal hemorrhoids that were medium sized and grade 1.   FEMORAL-POPLITEAL BYPASS GRAFT  11/06/2005   ILIAC ARTERY STENT  10/03/2011   Right CIA stenting   LEFT HEART CATH AND CORONARY ANGIOGRAPHY N/A 04/14/2019   Procedure: LEFT HEART CATH AND CORONARY ANGIOGRAPHY;  Surgeon: Troy Sine, MD;  Location: [place] CV LAB;  Service: Cardiovascular;  Laterality: N/A;   PERIPHERAL VASCULAR BALLOON ANGIOPLASTY Right 01/16/2017   Procedure: PERIPHERAL VASCULAR BALLOON ANGIOPLASTY;  Surgeon: Conrad Nice, MD;  Location: [place] CV LAB;  Service: Cardiovascular;  Laterality: Right;  common iliac    PERIPHERAL VASCULAR BALLOON ANGIOPLASTY  03/09/2020   Procedure: PERIPHERAL VASCULAR BALLOON ANGIOPLASTY;  Surgeon: Marty Heck, MD;  Location: [place] CV LAB;  Service: Cardiovascular;;   PR VEIN BYPASS GRAFT,AORTO-FEM-POP  06/22/2010   Redo Left Fem-Pop   Repair of left arm fracture     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 Authorizing Provider  atorvastatin (LIPITOR) 80 MG tablet Take 1 tablet (80 mg total) by mouth daily. 02/07/21 05/18/22 Yes Furth, Cadence H, PA-C  cilostazol (PLETAL) 100 MG tablet TAKE 1 TABLET(100 MG) BY MOUTH TWICE DAILY 04/30/21  Yes Waynetta Sandy, MD  clopidogrel (PLAVIX) 75 MG tablet Take 1 tablet (75 mg total) by mouth daily. 04/17/16  Yes Bridgett Larsson,  Jannette Fogo, MD  Dexlansoprazole (DEXILANT) 30 MG capsule Take 30- 60 min before your first and last meals of the day Patient taking differently: Take 30 mg by mouth daily. 01/28/17  Yes Tanda Rockers, MD  diclofenac Sodium (VOLTAREN) 1 % GEL Apply 2 gram  to painful foot and ankle joint twice daily. 10/17/21  Yes Marzetta Board, DPM  ferrous sulfate 325 (65 FE) MG tablet Take 325 mg by mouth  daily with breakfast.   Yes [provider]  glipiZIDE (GLUCOTROL) 5 MG tablet Take 5 mg by mouth daily. 02/19/21  Yes [provider]  HYDROcodone-acetaminophen (NORCO) 7.5-325 MG per tablet Take 1-2 tablets by mouth 2 (two) times daily as needed for moderate pain. 06/24/13  Yes [provider]  JANUVIA 100 MG tablet Take 100 mg by mouth daily. 07/26/13  Yes [provider]  JARDIANCE 25 MG TABS tablet Take 25 mg by mouth daily. 01/12/20  Yes [provider]  ketoconazole (NIZORAL) 2 % cream Apply to both feet and between toes once daily for 6 weeks. 10/17/21  Yes Marzetta Board, DPM  losartan (COZAAR) 25 MG tablet Take 25 mg by mouth daily.   Yes [provider]  ONE TOUCH ULTRA TEST test strip  08/13/13  Yes [provider]  OneTouch Delica Lancets 16X MISC Apply topically. 06/11/19  Yes [provider]  PROAIR HFA 108 (90 BASE) MCG/ACT inhaler Inhale 2 puffs into the lungs every 6 (six) hours as needed for wheezing or shortness of breath.  07/12/12  Yes [provider]  SYMBICORT 160-4.5 MCG/ACT inhaler INHALE 2 PUFFS FIRST THING IN THE MORNING AND THEN ANOTHER 2 PUFFS 12 HOURS LATER 12/04/21  Yes Tanda Rockers, MD  Tiotropium Bromide Monohydrate (SPIRIVA RESPIMAT) 2.5 MCG/ACT AERS INHALE 2 PUFFS INTO THE LUNGS DAILY 10/15/21  Yes Tanda Rockers, MD  XELJANZ 5 MG TABS Take 1 tablet (5 mg total) by mouth daily. 09/11/21  Yes Ofilia Neas, PA-C  aspirin EC 81 MG tablet Take 1 tablet (81 mg total) by mouth daily. Patient not taking: Reported on 11/13/2021 04/13/19   Erlene Quan, PA-C  nitroGLYCERIN (NITROSTAT) 0.4 MG SL tablet Place 1 tablet (0.4 mg total) under the tongue every 5 (five) minutes as needed for chest pain. 04/13/19   Erlene Quan, PA-C  sildenafil (REVATIO) 20 MG tablet Take 40-60 mg by mouth daily as needed for erectile dysfunction. 03/01/20   [provider]    Allergies as of 12/11/2021   (No  Known Allergies)    Family History  Problem Relation Age of Onset   Heart attack Mother 58   Deep vein thrombosis Mother    Diabetes Mother    Hyperlipidemia Mother    Hypertension Mother    Peripheral vascular disease Mother    Stroke Father    Diabetes Father    Hyperlipidemia Father    Hypertension Father    Peripheral vascular disease Father    Diabetes Sister    Hyperlipidemia Sister    Hypertension Sister    Diabetes Brother    Hyperlipidemia Brother    Cancer Brother    Deep vein thrombosis Brother    Hypertension Brother    Peripheral vascular disease Brother    Hyperlipidemia Son    Cancer Son    Hypertension Son    Diabetes Daughter    Colon cancer Neg Hx     Social History   Socioeconomic History   Marital  status: Married    Spouse name: Not on file   Number of children: Not on file   Years of education: Not on file   Highest education level: Not on file  Occupational History   Not on file  Tobacco Use   Smoking status: Former    Packs/day: 1.00    Years: 45.00    Total pack years: 45.00    Types: Cigarettes, Cigars    Quit date: 09/01/2011    Years since quitting: 10.3    Passive exposure: Never   Smokeless tobacco: Never  Vaping Use   Vaping Use: Never used  Substance and Sexual Activity   Alcohol use: Yes    Alcohol/week: 2.0 standard drinks of alcohol    Types: 2 Cans of beer per week   Drug use: No   Sexual activity: Not Currently    Birth control/protection: None  Other Topics Concern   Not on file  Social History Narrative   The patient lives in [place].    Social Determinants of Health   Financial Resource Strain: Not on file  Food Insecurity: Not on file  Transportation Needs: Not on file  Physical Activity: Not on file  Stress: Not on file  Social Connections: Not on file  Intimate Partner Violence: Not on file    Review of Systems: See HPI, otherwise negative ROS  Physical Exam: Vital signs in last 24 hours: Temp:   [97.9 F (36.6 C)] 97.9 F (36.6 C) (12/22 0828) Pulse Rate:  [72] 72 (12/22 0828) Resp:  [20] 20 (12/22 0828) BP: (134)/(87) 134/87 (12/22 0828) SpO2:  [97 %] 97 % (12/22 0828) Weight:  [76.6 kg] 76.6 kg (12/22 0828)   General:   Alert,  Well-developed, well-nourished, pleasant and cooperative in NAD Head:  Normocephalic and atraumatic. Eyes:  Sclera clear, no icterus.   Conjunctiva pink. Ears:  Normal auditory acuity. Nose:  No deformity, discharge,  or lesions. Msk:  Symmetrical without gross deformities. Normal posture. Extremities:  Without clubbing or edema. Neurologic:  Alert and  oriented x4;  grossly normal neurologically. Skin:  Intact without significant lesions or rashes. Psych:  Alert and cooperative. Normal mood and affect.  Impression/Plan: [name] is here for an EGD with possible dilation due to chronic GERD, dysphagia, and colonoscopy due to iron deficiency anemia.  The risks of the procedure including infection, bleed, or perforation as well as benefits, limitations, alternatives and imponderables have been reviewed with the patient. Questions have been answered. All parties agreeable.

## 2021-12-28 NOTE — Op Note (Signed)
[facility] Patient Name: [name] Procedure Date: 12/28/2021 9:35 AM MRN: [id number] Date of Birth: 12-Dec-1950 Attending MD: Elon Alas. Edgar Frisk, [id number] CSN: [id number] Age: 71 Admit Type: Outpatient Procedure:                Upper GI endoscopy Indications:              Iron deficiency anemia, Dysphagia, Heartburn Providers:                Elon Alas. Abbey Chatters, DO, Lurline Del, RN, Everardo Pacific Referring MD:              Medicines:                See the Anesthesia note for documentation of the                            administered medications Complications:            No immediate complications. Estimated Blood Loss:     Estimated blood loss was minimal. Procedure:                Pre-Anesthesia Assessment:                           - The anesthesia plan was to use monitored                            anesthesia care (MAC).                           After obtaining informed consent, the endoscope was                            passed under direct vision. Throughout the                            procedure, the patient's blood pressure, pulse, and                            oxygen saturations were monitored continuously. The                            GIF-H190 ([id number]) scope was introduced through the                            mouth, and advanced to the second part of duodenum.                            The upper GI endoscopy was accomplished without                            difficulty. The patient tolerated the procedure                            well. Scope In:  9:51:08 AM Scope Out: 9:55:53 AM Total Procedure Duration: 0 hours 4 minutes 45 seconds  Findings:      A small hiatal hernia was present.      There were esophageal mucosal changes suspicious for short-segment       Barrett's esophagus present at the gastroesophageal junction. The       maximum longitudinal extent of these mucosal changes was 2 cm in length.        Mucosa was biopsied with a cold forceps for histology. One specimen       bottle was sent to pathology.      Patchy mild inflammation characterized by erythema was found in the       stomach. Biopsies were taken with a cold forceps for Helicobacter pylori       testing.      The duodenal bulb, first portion of the duodenum and second portion of       the duodenum were normal. Impression:               - Small hiatal hernia.                           - Esophageal mucosal changes suspicious for                            short-segment Barrett's esophagus. Biopsied.                           - Gastritis. Biopsied.                           - Normal duodenal bulb, first portion of the                            duodenum and second portion of the duodenum. Moderate Sedation:      Per Anesthesia Care Recommendation:           - Patient has a contact number available for                            emergencies. The signs and symptoms of potential                            delayed complications were discussed with the                            patient. Return to normal activities tomorrow.                            Written discharge instructions were provided to the                            patient.                           - Resume previous diet.                           - Continue present medications.                           -  Await pathology results.                           - Use Dexilant (dexlansoprazole) 60 mg PO daily.                           - Return to GI clinic in 3 months. Procedure Code(s):        --- Professional ---                           [phone], Esophagogastroduodenoscopy, flexible,                            transoral; with biopsy, single or multiple Diagnosis Code(s):        --- Professional ---                           K44.9, Diaphragmatic hernia without obstruction or                            gangrene                           K22.89, Other specified disease  of esophagus                           K29.70, Gastritis, unspecified, without bleeding                           D50.9, Iron deficiency anemia, unspecified                           R13.10, Dysphagia, unspecified                           R12, Heartburn CPT copyright 2022 American Medical Association. All rights reserved. The codes documented in this report are preliminary and upon coder review may  be revised to meet current compliance requirements. Elon Alas. Abbey Chatters, DO [place] Abbey Chatters, DO 12/28/2021 9:59:44 AM This report has been signed electronically. Number of Addenda: 0

## 2021-12-28 NOTE — Anesthesia Procedure Notes (Signed)
Procedure Name: MAC Date/Time: 12/28/2021 9:55 AM  Performed by: Lieutenant Diego, CRNAPre-anesthesia Checklist: Patient identified, Emergency Drugs available, Suction available, Patient being monitored and Timeout performed Patient Re-evaluated:Patient Re-evaluated prior to induction Oxygen Delivery Method: Simple face mask Preoxygenation: Pre-oxygenation with 100% oxygen Induction Type: IV induction

## 2021-12-28 NOTE — Discharge Instructions (Addendum)
EGD Discharge instructions Please read the instructions outlined below and refer to this sheet in the next few weeks. These discharge instructions provide you with general information on caring for yourself after you leave the hospital. Your doctor may also give you specific instructions. While your treatment has been planned according to the most current medical practices available, unavoidable complications occasionally occur. If you have any problems or questions after discharge, please call your doctor. ACTIVITY You may resume your regular activity but move at a slower pace for the next 24 hours.  Take frequent rest periods for the next 24 hours.  Walking will help expel (get rid of) the air and reduce the bloated feeling in your abdomen.  No driving for 24 hours (because of the anesthesia (medicine) used during the test).  You may shower.  Do not sign any important legal documents or operate any machinery for 24 hours (because of the anesthesia used during the test).  NUTRITION Drink plenty of fluids.  You may resume your normal diet.  Begin with a light meal and progress to your normal diet.  Avoid alcoholic beverages for 24 hours or as instructed by your caregiver.  MEDICATIONS You may resume your normal medications unless your caregiver tells you otherwise.  WHAT YOU CAN EXPECT TODAY You may experience abdominal discomfort such as a feeling of fullness or "gas" pains.  FOLLOW-UP Your doctor will discuss the results of your test with you.  SEEK IMMEDIATE MEDICAL ATTENTION IF ANY OF THE FOLLOWING OCCUR: Excessive nausea (feeling sick to your stomach) and/or vomiting.  Severe abdominal pain and distention (swelling).  Trouble swallowing.  Temperature over 101 F (37.8 C).  Rectal bleeding or vomiting of blood.     Colonoscopy Discharge Instructions  Read the instructions outlined below and refer to this sheet in the next few weeks. These discharge instructions provide you with  general information on caring for yourself after you leave the hospital. Your doctor may also give you specific instructions. While your treatment has been planned according to the most current medical practices available, unavoidable complications occasionally occur.   ACTIVITY You may resume your regular activity, but move at a slower pace for the next 24 hours.  Take frequent rest periods for the next 24 hours.  Walking will help get rid of the air and reduce the bloated feeling in your belly (abdomen).  No driving for 24 hours (because of the medicine (anesthesia) used during the test).   Do not sign any important legal documents or operate any machinery for 24 hours (because of the anesthesia used during the test).  NUTRITION Drink plenty of fluids.  You may resume your normal diet as instructed by your doctor.  Begin with a light meal and progress to your normal diet. Heavy or fried foods are harder to digest and may make you feel sick to your stomach (nauseated).  Avoid alcoholic beverages for 24 hours or as instructed.  MEDICATIONS You may resume your normal medications unless your doctor tells you otherwise.  WHAT YOU CAN EXPECT TODAY Some feelings of bloating in the abdomen.  Passage of more gas than usual.  Spotting of blood in your stool or on the toilet paper.  IF YOU HAD POLYPS REMOVED DURING THE COLONOSCOPY: No aspirin products for 7 days or as instructed.  No alcohol for 7 days or as instructed.  Eat a soft diet for the next 24 hours.  FINDING OUT THE RESULTS OF YOUR TEST Not all test results are  available during your visit. If your test results are not back during the visit, make an appointment with your caregiver to find out the results. Do not assume everything is normal if you have not heard from your caregiver or the medical facility. It is important for you to follow up on all of your test results.  SEEK IMMEDIATE MEDICAL ATTENTION IF: You have more than a spotting of  blood in your stool.  Your belly is swollen (abdominal distention).  You are nauseated or vomiting.  You have a temperature over 101.  You have abdominal pain or discomfort that is severe or gets worse throughout the day.   Your EGD revealed mild amount inflammation in your stomach.  I took biopsies of this to rule out infection with a bacteria called H. pylori.  Await pathology results, my office will contact you.  He also had a small hiatal hernia and evidence of Barrett's esophagus.  I took samples of this as well.  Continue on Dexilant.  Your colonoscopy was relatively unremarkable.  I do not find any polyps or evidence of colon cancer.  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 I will order a CT scan to further evaluate.   You also have diverticulosis and internal hemorrhoids. I would recommend increasing fiber in your diet or adding OTC Benefiber/Metamucil. Be sure to drink at least 4 to 6 glasses of water daily.   Repeat colonoscopy in 10 years.  We may consider capsule endoscopy to evaluate your small bowel.  Follow-up with GI in 2 to 3 months.  I hope you have a great rest of your week!  Elon Alas. Abbey Chatters, D.O. Gastroenterology and Hepatology [facility]

## 2021-12-28 NOTE — Transfer of Care (Signed)
Immediate Anesthesia Transfer of Care Note  Patient: [name]  Procedure(s) Performed: COLONOSCOPY WITH PROPOFOL ESOPHAGOGASTRODUODENOSCOPY (EGD) WITH PROPOFOL BIOPSY  Patient Location: PACU  Anesthesia Type:MAC  Level of Consciousness: drowsy  Airway & Oxygen Therapy: Patient Spontanous Breathing and Patient connected to face mask oxygen  Post-op Assessment: Report given to RN and Post -op Vital signs reviewed and stable  Post vital signs: Reviewed and stable  Last Vitals:  Vitals Value Taken Time  BP    Temp    Pulse    Resp    SpO2      Last Pain:  Vitals:   12/28/21 0828  TempSrc: Oral  PainSc: 0-No pain         Complications: No notable events documented.

## 2021-12-28 NOTE — Telephone Encounter (Signed)
Please arrange CT abdomen/pelvis with and without contrast for transverse colon tumor.  Thank you

## 2021-12-28 NOTE — Anesthesia Postprocedure Evaluation (Signed)
Anesthesia Post Note  Patient: [name]  Procedure(s) Performed: COLONOSCOPY WITH PROPOFOL ESOPHAGOGASTRODUODENOSCOPY (EGD) WITH PROPOFOL BIOPSY  Patient location during evaluation: Phase II Anesthesia Type: General Level of consciousness: awake Pain management: pain level controlled Vital Signs Assessment: post-procedure vital signs reviewed and stable Respiratory status: spontaneous breathing and respiratory function stable Cardiovascular status: blood pressure returned to baseline and stable Postop Assessment: no headache and no apparent nausea or vomiting Anesthetic complications: no Comments: Late entry   No notable events documented.   Last Vitals:  Vitals:   12/28/21 1029 12/28/21 1033  BP:  135/81  Pulse: 80   Resp: 20   Temp: 36.4 C 36.4 C  SpO2: 95%     Last Pain:  Vitals:   12/28/21 1033  TempSrc: Oral  PainSc:                  Louann Sjogren

## 2022-01-02 ENCOUNTER — Other Ambulatory Visit: Payer: Self-pay

## 2022-01-02 DIAGNOSIS — I471 Supraventricular tachycardia, unspecified: Secondary | ICD-10-CM

## 2022-01-02 LAB — SURGICAL PATHOLOGY

## 2022-01-02 MED ORDER — METOPROLOL SUCCINATE ER 25 MG PO TB24: 25.0000 mg | ORAL_TABLET | Freq: Every day | ORAL | 1 refills | Status: DC

## 2022-01-02 NOTE — Addendum Note (Signed)
Addended by: Cheron Every on: 01/02/2022 08:36 AM   Modules accepted: Orders

## 2022-01-02 NOTE — Telephone Encounter (Signed)
Called pt. He is aware CT scheduled for 12/29, arrival 10:15am to start drinking contrast. Also aware NPO 4 hrs prior.   FYI

## 2022-01-04 ENCOUNTER — Telehealth: Payer: Self-pay | Admitting: Internal Medicine

## 2022-01-04 ENCOUNTER — Ambulatory Visit (HOSPITAL_COMMUNITY)
Admission: RE | Admit: 2022-01-04 | Discharge: 2022-01-04 | Disposition: A | Discharge status: Home / Self Care | Payer: Medicare Other | Source: Ambulatory Visit | Attending: Internal Medicine | Admitting: Internal Medicine

## 2022-01-04 DIAGNOSIS — D490 Neoplasm of unspecified behavior of digestive system: Secondary | ICD-10-CM | POA: Insufficient documentation

## 2022-01-04 DIAGNOSIS — I10 Essential (primary) hypertension: Secondary | ICD-10-CM | POA: Diagnosis not present

## 2022-01-04 DIAGNOSIS — K760 Fatty (change of) liver, not elsewhere classified: Secondary | ICD-10-CM | POA: Diagnosis not present

## 2022-01-04 DIAGNOSIS — E1165 Type 2 diabetes mellitus with hyperglycemia: Secondary | ICD-10-CM | POA: Diagnosis not present

## 2022-01-04 DIAGNOSIS — C189 Malignant neoplasm of colon, unspecified: Secondary | ICD-10-CM | POA: Diagnosis not present

## 2022-01-04 DIAGNOSIS — K5730 Diverticulosis of large intestine without perforation or abscess without bleeding: Secondary | ICD-10-CM | POA: Diagnosis not present

## 2022-01-04 DIAGNOSIS — N289 Disorder of kidney and ureter, unspecified: Secondary | ICD-10-CM | POA: Diagnosis not present

## 2022-01-04 LAB — POCT I-STAT CREATININE: Creatinine, Ser: 1 mg/dL (ref 0.61–1.24)

## 2022-01-04 MED ORDER — METRONIDAZOLE 500 MG PO TABS: 500.0000 mg | ORAL_TABLET | Freq: Three times a day (TID) | ORAL | 0 refills | Status: AC

## 2022-01-04 MED ORDER — BISMUTH 262 MG PO CHEW: 2.0000 | CHEWABLE_TABLET | Freq: Four times a day (QID) | ORAL | 0 refills | Status: AC

## 2022-01-04 MED ORDER — TETRACYCLINE HCL 500 MG PO CAPS: 500.0000 mg | ORAL_CAPSULE | Freq: Four times a day (QID) | ORAL | 0 refills | Status: AC

## 2022-01-04 MED ORDER — IOHEXOL 300 MG/ML  SOLN
100.0000 mL | Freq: Once | INTRAMUSCULAR | Status: AC | PRN
  Administered 2022-01-04: 100 mL via INTRAVENOUS

## 2022-01-04 NOTE — Telephone Encounter (Signed)
Patient has H. pylori gastritis.  I will send in 14-day course of tetracycline 500 mg 4 times daily, metronidazole 500 mg 3 times daily.   Continue Dexilant 60 mg daily.   I will also send in bismuth to take 4 times daily as well.    I called and relayed this to patient,   Follow-up as previously scheduled to discuss eradication testing.  Thank you

## 2022-01-06 DIAGNOSIS — I10 Essential (primary) hypertension: Secondary | ICD-10-CM | POA: Diagnosis not present

## 2022-01-06 DIAGNOSIS — E7800 Pure hypercholesterolemia, unspecified: Secondary | ICD-10-CM | POA: Diagnosis not present

## 2022-01-08 NOTE — Telephone Encounter (Signed)
noted 

## 2022-01-10 ENCOUNTER — Telehealth: Payer: Self-pay | Admitting: Internal Medicine

## 2022-01-10 ENCOUNTER — Ambulatory Visit (HOSPITAL_COMMUNITY)
Admission: RE | Admit: 2022-01-10 | Discharge: 2022-01-10 | Disposition: A | Discharge status: Home / Self Care | Payer: Medicare Other | Source: Ambulatory Visit | Attending: Cardiovascular Disease | Admitting: Cardiovascular Disease

## 2022-01-10 DIAGNOSIS — I471 Supraventricular tachycardia, unspecified: Secondary | ICD-10-CM

## 2022-01-10 DIAGNOSIS — I351 Nonrheumatic aortic (valve) insufficiency: Secondary | ICD-10-CM | POA: Diagnosis not present

## 2022-01-10 DIAGNOSIS — E119 Type 2 diabetes mellitus without complications: Secondary | ICD-10-CM | POA: Insufficient documentation

## 2022-01-10 DIAGNOSIS — I119 Hypertensive heart disease without heart failure: Secondary | ICD-10-CM | POA: Insufficient documentation

## 2022-01-10 DIAGNOSIS — J449 Chronic obstructive pulmonary disease, unspecified: Secondary | ICD-10-CM | POA: Insufficient documentation

## 2022-01-10 DIAGNOSIS — F17200 Nicotine dependence, unspecified, uncomplicated: Secondary | ICD-10-CM | POA: Insufficient documentation

## 2022-01-10 DIAGNOSIS — E785 Hyperlipidemia, unspecified: Secondary | ICD-10-CM | POA: Insufficient documentation

## 2022-01-10 LAB — ECHOCARDIOGRAM COMPLETE
AR max vel: 1.23 cm2
AV Area VTI: 1.16 cm2
AV Area mean vel: 1.34 cm2
AV Mean grad: 8 mmHg
AV Peak grad: 15.2 mmHg
Ao pk vel: 1.95 m/s
Area-P 1/2: 2.87 cm2
P 1/2 time: 531 msec
S' Lateral: 3.7 cm

## 2022-01-10 NOTE — Progress Notes (Signed)
*  PRELIMINARY RESULTS* Echocardiogram 2D Echocardiogram has been performed.  [name] 01/10/2022, 9:21 AM

## 2022-01-10 NOTE — Telephone Encounter (Signed)
The patient came in this morning to make sure that we had his cell number along with his house number on file because he said he was waiting on a call from Dr. Abbey Chatters.  He didn't want to miss the call because it was about results that he was waiting on.  I saw where Dr. Abbey Chatters spoke to the patient on 01/04/22 and am wondering if the patient is just confused or is he waiting on some other results.

## 2022-01-11 NOTE — Telephone Encounter (Signed)
Returned the pt's call and he stated he had already spoke with Dr Abbey Chatters yesterday and that Dr Abbey Chatters will get back with him after speaking to another Dr regarding his reports.

## 2022-01-15 ENCOUNTER — Encounter (HOSPITAL_COMMUNITY): Payer: Self-pay | Admitting: Internal Medicine

## 2022-01-16 NOTE — Progress Notes (Unsigned)
Cardiology Office Note  Date: 01/17/2022   ID: [name], DOB 1950/11/25, MRN [id number]  PCP:  [name], MD  Cardiologist:  Rozann Lesches, MD Electrophysiologist:  None   Chief Complaint  Patient presents with   Cardiac follow-up    History of Present Illness: [name] is a 72 y.o. male that I last saw in March 2023.  Interval evaluation noted by Dr. Audie Box, I reviewed the chart and testing.  He was seen most recently in November 2023.  He states that he was originally evaluated for a sense of intermittent palpitations, describes brief rapid heartbeat, no chest pain or syncope.  He was seen by PCP initially and then by Dr. Audie Box, ultimately wore a heart monitor and had an echocardiogram as discussed below.  In the interim he had preprocedure assessment prior to colonoscopy which he underwent without cardiac event and was temporarily off Plavix (for PAD).  Cardiac catheterization in 2021 demonstrated nonobstructive CAD, LVEF approximately 50% at that time.  Recent cardiac monitor demonstrated brief episodes of PSVT, he was started on beta-blocker by Dr. Audie Box although states that he never started the medication. Subsequent echocardiogram demonstrated mild LV dysfunction with LVEF 45 to 50% and mild apical septal hypokinesis.  Discussed the results of his cardiac testing.  He states that his palpitations have not increased.  He does not report any angina or nitroglycerin use, stable NYHA class II dyspnea, no sudden unexplained syncope.  He has continued to follow-up with [facility] for PAD, has a visit in a few months.  I reviewed the last note from November 2023.  Claudication has been stable, right worse than left.  Past Medical History:  Diagnosis Date   Anemia    Bronchitis    Carotid artery disease ([place])    Cervical spine disease    Neck surgery in 2007 with Dr. Joya Salm   COPD (chronic obstructive pulmonary disease) ([place])    Coronary atherosclerosis of native coronary  artery    Nonobstrtuctive by catheterization 2007, 2013, 2021   DVT (deep venous thrombosis) ([place])    Essential hypertension    GERD (gastroesophageal reflux disease)    Hyperlipidemia    PAD (peripheral artery disease) ([place])    s/p left fem-pop bypass 2007; PTA and stenting of right iliac artery 09/2011   Rheumatoid arthritis(714.0)    Type 2 diabetes mellitus (HCC)     Current Outpatient Medications  Medication Sig Dispense Refill   atorvastatin (LIPITOR) 80 MG tablet Take 1 tablet (80 mg total) by mouth daily. 90 tablet 3   Bismuth 262 MG CHEW Chew 2 tablets by mouth in the morning, at noon, in the evening, and at bedtime for 14 days. 112 tablet 0   cilostazol (PLETAL) 100 MG tablet TAKE 1 TABLET(100 MG) BY MOUTH TWICE DAILY 60 tablet 5   clopidogrel (PLAVIX) 75 MG tablet Take 1 tablet (75 mg total) by mouth daily. 30 tablet 11   Dexlansoprazole (DEXILANT) 30 MG capsule Take 30- 60 min before your first and last meals of the day (Patient taking differently: Take 30 mg by mouth daily.) 60 capsule 11   diclofenac Sodium (VOLTAREN) 1 % GEL Apply 2 gram  to painful foot and ankle joint twice daily. 150 g 1   ferrous sulfate 325 (65 FE) MG tablet Take 325 mg by mouth daily with breakfast.     glipiZIDE (GLUCOTROL) 5 MG tablet Take 5 mg by mouth daily.     HYDROcodone-acetaminophen (NORCO) 7.5-325 MG per tablet  Take 1-2 tablets by mouth 2 (two) times daily as needed for moderate pain.     JANUVIA 100 MG tablet Take 100 mg by mouth daily.     JARDIANCE 25 MG TABS tablet Take 25 mg by mouth daily.     ketoconazole (NIZORAL) 2 % cream Apply to both feet and between toes once daily for 6 weeks. 60 g 1   losartan (COZAAR) 25 MG tablet Take 25 mg by mouth daily.     metoprolol succinate (TOPROL XL) 25 MG 24 hr tablet Take 0.5 tablets (12.5 mg total) by mouth daily. 45 tablet 3   metroNIDAZOLE (FLAGYL) 500 MG tablet Take 1 tablet (500 mg total) by mouth 3 (three) times daily for 14 days. 42 tablet 0    nitroGLYCERIN (NITROSTAT) 0.4 MG SL tablet Place 1 tablet (0.4 mg total) under the tongue every 5 (five) minutes as needed for chest pain. 25 tablet 3   ONE TOUCH ULTRA TEST test strip      OneTouch Delica Lancets 49F MISC Apply topically.     PROAIR HFA 108 (90 BASE) MCG/ACT inhaler Inhale 2 puffs into the lungs every 6 (six) hours as needed for wheezing or shortness of breath.      sildenafil (REVATIO) 20 MG tablet Take 40-60 mg by mouth daily as needed for erectile dysfunction.     SYMBICORT 160-4.5 MCG/ACT inhaler INHALE 2 PUFFS FIRST THING IN THE MORNING AND THEN ANOTHER 2 PUFFS 12 HOURS LATER 10.2 g 11   tetracycline (SUMYCIN) 500 MG capsule Take 1 capsule (500 mg total) by mouth 4 (four) times daily for 14 days. 56 capsule 0   Tiotropium Bromide Monohydrate (SPIRIVA RESPIMAT) 2.5 MCG/ACT AERS INHALE 2 PUFFS INTO THE LUNGS DAILY 4 g 11   XELJANZ 5 MG TABS Take 1 tablet (5 mg total) by mouth daily. 90 tablet 0   No current facility-administered medications for this visit.   Allergies:  Patient has no known allergies.   ROS: No orthopnea or PND.  Physical Exam: VS:  BP 126/78   Pulse 88   Ht '5\' 9"'$  (1.753 m)   Wt 168 lb (76.2 kg)   SpO2 94%   BMI 24.81 kg/m , BMI Body mass index is 24.81 kg/m.  Wt Readings from Last 3 Encounters:  01/17/22 168 lb (76.2 kg)  12/28/21 168 lb 12.8 oz (76.6 kg)  12/26/21 168 lb 12.8 oz (76.6 kg)    General: Patient appears comfortable at rest. HEENT: Conjunctiva and lids normal. Neck: Supple, no elevated JVP or carotid bruits. Lungs: Clear to auscultation, nonlabored breathing at rest. Cardiac: Regular rate and rhythm, no S3 or significant systolic murmur. Abdomen: Bowel sounds present. Extremities: No pitting edema, distal pulses significantly diminished as before.  No ulcerations.  ECG:  An ECG dated 05/17/2021 was personally reviewed today and demonstrated:  Sinus tachycardia.  Recent Labwork: 09/11/2021: ALT 12; AST 17; BUN 17; Potassium  4.5; Sodium 141 12/28/2021: Hemoglobin 11.8; Platelets 306 01/04/2022: Creatinine, Ser 1.00     Component Value Date/Time   CHOL 146 09/11/2021 0937   CHOL 160 11/09/2019 1032   TRIG 56 09/11/2021 0937   HDL 57 09/11/2021 0937   HDL 61 11/09/2019 1032   CHOLHDL 2.6 09/11/2021 0937   VLDL 17 01/25/2021 1118   LDLCALC 75 09/11/2021 0937    Other Studies Reviewed Today:  Cardiac monitor December 2023: Impression: Brief supraventricular tachycardia detected (6 episodes in 7 days; longest duration 6.8 seconds).  Occasional PACs (1.4% burden).  Echocardiogram 01/10/2022:  1. Left ventricular ejection fraction, by estimation, is 45 to 50%. The  left ventricle has mildly decreased function. The left ventricle  demonstrates regional wall motion abnormalities (see scoring  diagram/findings for description). There is mild  concentric left ventricular hypertrophy. Left ventricular diastolic  parameters are consistent with Grade I diastolic dysfunction (impaired  relaxation).   2. Right ventricular systolic function is normal. The right ventricular  size is normal.   3. Left atrial size was upper normal.   4. The mitral valve is grossly normal. Trivial mitral valve  regurgitation.   5. The aortic valve is tricuspid. There is mild calcification of the  aortic valve. Aortic valve regurgitation is mild. Aortic regurgitation PHT  measures 531 msec. Aortic valve mean gradient measures 8.0 mmHg.   6. Aortic dilatation noted. There is borderline dilatation of the aortic  root, measuring 39 mm.   7. The inferior vena cava is normal in size with greater than 50%  respiratory variability, suggesting right atrial pressure of 3 mmHg.   Assessment and Plan:  1.  Intermittent palpitations without chest pain or syncope.  Cardiac monitor shows brief episodes of PSVT, no atrial fibrillation with occasional PACs as well.  Agree with addition of beta-blocker.  We will start with Toprol XL 12.5 mg daily  and uptitrate as tolerated.  2.  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  He has no history of prior myocardial infarction, LVEF 50% at cardiac catheterization in 2021 at which point he had only mild CAD.  Would still suspect nonischemic cardiomyopathy.  Given PAD however we will re-screen for ischemia with Lexiscan Myoview.  Currently on Jardiance and Cozaar, adding low-dose beta-blocker as discussed above.  3.  Mixed hyperlipidemia, continues on Lipitor.  Last LDL 75.  4.  PAD followed by [facility].  Reports stable claudication, right greater than left.  Keep follow-up as scheduled.  He remains on Plavix and Lipitor, also Pletal.  Medication Adjustments/Labs and Tests Ordered: Current medicines are reviewed at length with the patient today.  Concerns regarding medicines are outlined above.   Tests Ordered: Orders Placed This Encounter  Procedures   NM Myocar Multi W/Spect W/Wall Motion / EF    Medication Changes: Meds ordered this encounter  Medications   metoprolol succinate (TOPROL XL) 25 MG 24 hr tablet    Sig: Take 0.5 tablets (12.5 mg total) by mouth daily.    Dispense:  45 tablet    Refill:  3    01/17/22 start 12.5 mg qd per MD    Disposition:  Follow up  6 weeks.  Signed, Satira Sark, MD, [facility] 01/17/2022 9:13 AM    [facility] [place]. [address] [place], [place] [address] Phone: [phone]; Fax: [phone]

## 2022-01-17 ENCOUNTER — Ambulatory Visit: Payer: 59 | Attending: Cardiology | Admitting: Cardiology

## 2022-01-17 ENCOUNTER — Telehealth: Payer: Self-pay | Admitting: Internal Medicine

## 2022-01-17 ENCOUNTER — Encounter: Payer: Self-pay | Admitting: Cardiology

## 2022-01-17 VITALS — BP 126/78 | HR 88 | Ht 69.0 in | Wt 168.0 lb

## 2022-01-17 DIAGNOSIS — E782 Mixed hyperlipidemia: Secondary | ICD-10-CM

## 2022-01-17 DIAGNOSIS — I25119 Atherosclerotic heart disease of native coronary artery with unspecified angina pectoris: Secondary | ICD-10-CM

## 2022-01-17 DIAGNOSIS — I739 Peripheral vascular disease, unspecified: Secondary | ICD-10-CM | POA: Diagnosis not present

## 2022-01-17 DIAGNOSIS — I471 Supraventricular tachycardia, unspecified: Secondary | ICD-10-CM | POA: Diagnosis not present

## 2022-01-17 DIAGNOSIS — I5022 Chronic systolic (congestive) heart failure: Secondary | ICD-10-CM

## 2022-01-17 MED ORDER — METOPROLOL SUCCINATE ER 25 MG PO TB24: 12.5000 mg | ORAL_TABLET | Freq: Every day | ORAL | 3 refills | Status: DC

## 2022-01-17 NOTE — Telephone Encounter (Signed)
Patient came into the office to see if someone could call him back after 11:00 today.  He said he was still in a lot of pain from the colonoscopy he had and he wanted to speak to Dr. Abbey Chatters or the PA about it.

## 2022-01-17 NOTE — Patient Instructions (Signed)
Medication Instructions:  Take Toprol XL 12.5 mg daily   Labwork: None today  Testing/Procedures: Your physician has requested that you have a lexiscan myoview. For further information please visit [email or web address]. Please follow instruction sheet, as given.   Follow-Up: 6 weeks  Any Other Special Instructions Will Be Listed Below (If Applicable).  If you need a refill on your cardiac medications before your next appointment, please call your pharmacy.

## 2022-01-17 NOTE — Telephone Encounter (Signed)
Can you please read the pt's colonoscopy report and or EGD before I call this pt back?

## 2022-01-24 ENCOUNTER — Encounter (HOSPITAL_COMMUNITY)
Admission: RE | Admit: 2022-01-24 | Discharge: 2022-01-24 | Disposition: A | Discharge status: Home / Self Care | Payer: 59 | Source: Ambulatory Visit | Attending: Cardiology | Admitting: Cardiology

## 2022-01-24 ENCOUNTER — Encounter (HOSPITAL_COMMUNITY): Payer: Self-pay

## 2022-01-24 ENCOUNTER — Ambulatory Visit (HOSPITAL_COMMUNITY)
Admission: RE | Admit: 2022-01-24 | Discharge: 2022-01-24 | Disposition: A | Discharge status: Home / Self Care | Payer: 59 | Source: Ambulatory Visit | Attending: Cardiology | Admitting: Cardiology

## 2022-01-24 DIAGNOSIS — I25119 Atherosclerotic heart disease of native coronary artery with unspecified angina pectoris: Secondary | ICD-10-CM | POA: Diagnosis not present

## 2022-01-24 HISTORY — DX: Systemic involvement of connective tissue, unspecified: M35.9

## 2022-01-24 LAB — NM MYOCAR MULTI W/SPECT W/WALL MOTION / EF
LV dias vol: 111 mL (ref 62–150)
LV sys vol: 53 mL
Nuc Stress EF: 53 %
Peak HR: 114 {beats}/min
RATE: 0.3
Rest HR: 89 {beats}/min
Rest Nuclear Isotope Dose: 10.6 mCi
SDS: 2
SRS: 2
SSS: 4
ST Depression (mm): 0 mm
Stress Nuclear Isotope Dose: 32 mCi
TID: 0.96

## 2022-01-24 MED ORDER — SODIUM CHLORIDE FLUSH 0.9 % IV SOLN
INTRAVENOUS | Status: AC
  Administered 2022-01-24: 10 mL
  Filled 2022-01-24: qty 10

## 2022-01-24 MED ORDER — TECHNETIUM TC 99M TETROFOSMIN IV KIT
30.0000 | PACK | Freq: Once | INTRAVENOUS | Status: AC | PRN
  Administered 2022-01-24: 32 via INTRAVENOUS

## 2022-01-24 MED ORDER — TECHNETIUM TC 99M TETROFOSMIN IV KIT
10.0000 | PACK | Freq: Once | INTRAVENOUS | Status: AC | PRN
  Administered 2022-01-24: 10.6 via INTRAVENOUS

## 2022-01-24 MED ORDER — REGADENOSON 0.4 MG/5ML IV SOLN
INTRAVENOUS | Status: AC
  Administered 2022-01-24: 0.4 mg
  Filled 2022-01-24: qty 5

## 2022-01-25 ENCOUNTER — Telehealth: Payer: Self-pay | Admitting: Internal Medicine

## 2022-01-25 DIAGNOSIS — D490 Neoplasm of unspecified behavior of digestive system: Secondary | ICD-10-CM

## 2022-01-25 NOTE — Telephone Encounter (Signed)
Please refer this patient to Dr. Arnoldo Morale to discuss ascending colon cystic mass.  I have already discussed this patient with him prior.  Thank you

## 2022-01-28 NOTE — Telephone Encounter (Signed)
Referral sent 

## 2022-01-28 NOTE — Addendum Note (Signed)
Addended by: Cheron Every on: 01/28/2022 07:26 AM   Modules accepted: Orders

## 2022-01-29 ENCOUNTER — Other Ambulatory Visit (HOSPITAL_COMMUNITY): Payer: Medicare Other

## 2022-01-29 DIAGNOSIS — J449 Chronic obstructive pulmonary disease, unspecified: Secondary | ICD-10-CM | POA: Diagnosis not present

## 2022-01-29 DIAGNOSIS — K921 Melena: Secondary | ICD-10-CM | POA: Diagnosis not present

## 2022-01-29 DIAGNOSIS — Z1211 Encounter for screening for malignant neoplasm of colon: Secondary | ICD-10-CM | POA: Diagnosis not present

## 2022-01-29 DIAGNOSIS — I700 Atherosclerosis of aorta: Secondary | ICD-10-CM | POA: Diagnosis not present

## 2022-01-29 DIAGNOSIS — I10 Essential (primary) hypertension: Secondary | ICD-10-CM | POA: Diagnosis not present

## 2022-01-29 DIAGNOSIS — Z299 Encounter for prophylactic measures, unspecified: Secondary | ICD-10-CM | POA: Diagnosis not present

## 2022-02-04 DIAGNOSIS — E1165 Type 2 diabetes mellitus with hyperglycemia: Secondary | ICD-10-CM | POA: Diagnosis not present

## 2022-02-04 DIAGNOSIS — I10 Essential (primary) hypertension: Secondary | ICD-10-CM | POA: Diagnosis not present

## 2022-02-05 ENCOUNTER — Other Ambulatory Visit: Payer: Self-pay | Admitting: Physician Assistant

## 2022-02-05 NOTE — Telephone Encounter (Signed)
Next Visit: 03/14/2022  Last Visit: 12/11/2021  Last Fill: 09/11/2021  HU:[id number] arthritis of multiple sites with negative rheumatoid factor   Current Dose per office note on 12/11/2021: Morrie Sheldon 5 mg 1 tablet by mouth daily.   Labs: 12/28/2021 CBC: hemoglobin 11.8, MCH 25.8, RDW 19.8 11/09/2021 BMP  TB Gold: 12/11/2021 negative    Okay to refill xeljanz?

## 2022-02-12 DIAGNOSIS — I25119 Atherosclerotic heart disease of native coronary artery with unspecified angina pectoris: Secondary | ICD-10-CM | POA: Diagnosis not present

## 2022-02-12 DIAGNOSIS — Z299 Encounter for prophylactic measures, unspecified: Secondary | ICD-10-CM | POA: Diagnosis not present

## 2022-02-12 DIAGNOSIS — J069 Acute upper respiratory infection, unspecified: Secondary | ICD-10-CM | POA: Diagnosis not present

## 2022-02-12 DIAGNOSIS — I739 Peripheral vascular disease, unspecified: Secondary | ICD-10-CM | POA: Diagnosis not present

## 2022-02-12 DIAGNOSIS — J441 Chronic obstructive pulmonary disease with (acute) exacerbation: Secondary | ICD-10-CM | POA: Diagnosis not present

## 2022-02-14 ENCOUNTER — Ambulatory Visit (INDEPENDENT_AMBULATORY_CARE_PROVIDER_SITE_OTHER): Payer: 59 | Admitting: General Surgery

## 2022-02-14 ENCOUNTER — Telehealth (INDEPENDENT_AMBULATORY_CARE_PROVIDER_SITE_OTHER): Payer: 59 | Admitting: Internal Medicine

## 2022-02-14 ENCOUNTER — Telehealth: Payer: Self-pay | Admitting: *Deleted

## 2022-02-14 ENCOUNTER — Encounter: Payer: Self-pay | Admitting: General Surgery

## 2022-02-14 VITALS — BP 114/77 | HR 102 | Temp 98.1°F | Resp 14 | Ht 69.0 in | Wt 170.0 lb

## 2022-02-14 DIAGNOSIS — D490 Neoplasm of unspecified behavior of digestive system: Secondary | ICD-10-CM | POA: Diagnosis not present

## 2022-02-14 DIAGNOSIS — K6389 Other specified diseases of intestine: Secondary | ICD-10-CM

## 2022-02-14 NOTE — Telephone Encounter (Signed)
Pt walked in office and states that his palpitations have increased over the last 2 days. Pt reports that he went to see his PCP and was started on an antibiotic. Pt states that he stopped taking his Metoprolol 2 days when he started taking the antibiotic d/t " Having so many pill to take". Encouraged pt to restart Metoprolol as prescribed. Please advise.

## 2022-02-14 NOTE — Progress Notes (Signed)
[name]; [id number]; [birth year]   HPI Patient is a 72 year old black male who was referred to my care by Dr. Abbey Chatters of gastroenterology for evaluation and treatment of a right colon mass.  This was found on routine colonoscopy.  It appeared to be a submucosal mass, approximately 3 cm in greatest diameter.  No biopsies were taken.  Patient has been asymptomatic.  It was seen CT of the abdomen and pelvis.  Patient denies any nausea or vomiting.  He is currently having a cardiac workup to follow-up his history of heart failure.  Dr. Domenic Polite of cardiology is following him.  No blood per rectum is noted. Past Medical History:  Diagnosis Date   Anemia    Bronchitis    Carotid artery disease ([place])    Cervical spine disease    Neck surgery in 2007 with Dr. Joya Salm   Collagen vascular disease [facility])    COPD (chronic obstructive pulmonary disease) ([place])    Coronary atherosclerosis of native coronary artery    Nonobstrtuctive by catheterization 2007, 2013, 2021   DVT (deep venous thrombosis) ([place])    Essential hypertension    GERD (gastroesophageal reflux disease)    Hyperlipidemia    PAD (peripheral artery disease) ([place])    s/p left fem-pop bypass 2007; PTA and stenting of right iliac artery 09/2011   Rheumatoid arthritis(714.0)    Type 2 diabetes mellitus ([place])     Past Surgical History:  Procedure Laterality Date   ABDOMINAL AORTAGRAM N/A 10/03/2011   Procedure: ABDOMINAL Maxcine Ham;  Surgeon: Conrad [place], MD;  Location: [facility] CATH LAB;  Service: Cardiovascular;  Laterality: N/A;   ABDOMINAL AORTOGRAM W/LOWER EXTREMITY N/A 01/16/2017   Procedure: ABDOMINAL AORTOGRAM W/LOWER EXTREMITY;  Surgeon: Conrad [place], MD;  Location: [facility] CV LAB;  Service: Cardiovascular;  Laterality: N/A;   ABDOMINAL AORTOGRAM W/LOWER EXTREMITY Bilateral 08/04/2019   Procedure: ABDOMINAL AORTOGRAM W/LOWER EXTREMITY;  Surgeon: Marty Heck, MD;  Location: [facility] CV LAB;  Service: Cardiovascular;   Laterality: Bilateral;   ABDOMINAL AORTOGRAM W/LOWER EXTREMITY N/A 03/09/2020   Procedure: ABDOMINAL AORTOGRAM W/LOWER EXTREMITY;  Surgeon: Marty Heck, MD;  Location: [facility] CV LAB;  Service: Cardiovascular;  Laterality: N/A;   BIOPSY N/A 12/28/2021   Procedure: BIOPSY;  Surgeon: Eloise Harman, DO;  [facility];  Service: Endoscopy;  Laterality: N/A;   CERVICAL DISC SURGERY     COLONOSCOPY  2013   Hyperplastic polyp   COLONOSCOPY WITH PROPOFOL N/A 02/17/2017   Surgeon: Daneil Dolin, MD; Many small and large mouth diverticula in the sigmoid, descending, transverse colon, internal hemorrhoids that were medium sized and grade 1.   COLONOSCOPY WITH PROPOFOL N/A 12/28/2021   Procedure: COLONOSCOPY WITH PROPOFOL;  Surgeon: Eloise Harman, DO;  [facility];  Service: Endoscopy;  Laterality: N/A;  10:45 am   ESOPHAGOGASTRODUODENOSCOPY (EGD) WITH PROPOFOL N/A 12/28/2021   Procedure: ESOPHAGOGASTRODUODENOSCOPY (EGD) WITH PROPOFOL;  Surgeon: Eloise Harman, DO;  [facility];  Service: Endoscopy;  Laterality: N/A;   FEMORAL-POPLITEAL BYPASS GRAFT  11/06/2005   ILIAC ARTERY STENT  10/03/2011   Right CIA stenting   LEFT HEART CATH AND CORONARY ANGIOGRAPHY N/A 04/14/2019   Procedure: LEFT HEART CATH AND CORONARY ANGIOGRAPHY;  Surgeon: Troy Sine, MD;  Location: [facility] CV LAB;  Service: Cardiovascular;  Laterality: N/A;   PERIPHERAL VASCULAR BALLOON ANGIOPLASTY Right 01/16/2017   Procedure: PERIPHERAL VASCULAR BALLOON ANGIOPLASTY;  Surgeon: Conrad [place], MD;  [facility]: [facility]  INVASIVE CV LAB;  Service: Cardiovascular;  Laterality: Right;  common iliac    PERIPHERAL VASCULAR BALLOON ANGIOPLASTY  03/09/2020   Procedure: PERIPHERAL VASCULAR BALLOON ANGIOPLASTY;  Surgeon: Marty Heck, MD;  Location: [facility] CV LAB;  Service: Cardiovascular;;   PR VEIN BYPASS GRAFT,AORTO-FEM-POP  06/22/2010   Redo Left Fem-Pop   Repair of left  arm fracture     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     SURGERY SCROTAL / TESTICULAR      Family History  Problem Relation Age of Onset   Heart attack Mother 34   Deep vein thrombosis Mother    Diabetes Mother    Hyperlipidemia Mother    Hypertension Mother    Peripheral vascular disease Mother    Stroke Father    Diabetes Father    Hyperlipidemia Father    Hypertension Father    Peripheral vascular disease Father    Diabetes Sister    Hyperlipidemia Sister    Hypertension Sister    Diabetes Brother    Hyperlipidemia Brother    Cancer Brother    Deep vein thrombosis Brother    Hypertension Brother    Peripheral vascular disease Brother    Hyperlipidemia Son    Cancer Son    Hypertension Son    Diabetes Daughter    Colon cancer Neg Hx     Current Outpatient Medications on File Prior to Visit  Medication Sig Dispense Refill   atorvastatin (LIPITOR) 80 MG tablet Take 1 tablet (80 mg total) by mouth daily. 90 tablet 3   cilostazol (PLETAL) 100 MG tablet TAKE 1 TABLET(100 MG) BY MOUTH TWICE DAILY 60 tablet 5   clopidogrel (PLAVIX) 75 MG tablet Take 1 tablet (75 mg total) by mouth daily. 30 tablet 11   Dexlansoprazole (DEXILANT) 30 MG capsule Take 30- 60 min before your first and last meals of the day (Patient taking differently: Take 30 mg by mouth daily.) 60 capsule 11   diclofenac Sodium (VOLTAREN) 1 % GEL Apply 2 gram  to painful foot and ankle joint twice daily. 150 g 1   ferrous sulfate 325 (65 FE) MG tablet Take 325 mg by mouth daily with breakfast.     glipiZIDE (GLUCOTROL) 5 MG tablet Take 5 mg by mouth daily.     HYDROcodone-acetaminophen (NORCO) 7.5-325 MG per tablet Take 1-2 tablets by mouth 2 (two) times daily as needed for moderate pain.     JANUVIA 100 MG tablet Take 100 mg by mouth daily.     JARDIANCE 25 MG TABS tablet Take 25 mg by mouth daily.     ketoconazole (NIZORAL) 2 % cream Apply to both feet and between toes once daily for 6 weeks. 60 g 1   losartan  (COZAAR) 25 MG tablet Take 25 mg by mouth daily.     metoprolol succinate (TOPROL XL) 25 MG 24 hr tablet Take 0.5 tablets (12.5 mg total) by mouth daily. 45 tablet 3   nitroGLYCERIN (NITROSTAT) 0.4 MG SL tablet Place 1 tablet (0.4 mg total) under the tongue every 5 (five) minutes as needed for chest pain. 25 tablet 3   ONE TOUCH ULTRA TEST test strip      OneTouch Delica Lancets 58K MISC Apply topically.     PROAIR HFA 108 (90 BASE) MCG/ACT inhaler Inhale 2 puffs into the lungs every 6 (six) hours as needed for wheezing or shortness of breath.      sildenafil (REVATIO) 20 MG tablet Take 40-60 mg by mouth daily  as needed for erectile dysfunction.     SYMBICORT 160-4.5 MCG/ACT inhaler INHALE 2 PUFFS FIRST THING IN THE MORNING AND THEN ANOTHER 2 PUFFS 12 HOURS LATER 10.2 g 11   Tiotropium Bromide Monohydrate (SPIRIVA RESPIMAT) 2.5 MCG/ACT AERS INHALE 2 PUFFS INTO THE LUNGS DAILY 4 g 11   XELJANZ 5 MG TABS TAKE 1 TABLET(5 MG) BY MOUTH DAILY 90 tablet 0   No current facility-administered medications on file prior to visit.    No Known Allergies  Social History   Substance and Sexual Activity  Alcohol Use Not Currently   Alcohol/week: 2.0 standard drinks of alcohol   Types: 2 Cans of beer per week    Social History   Tobacco Use  Smoking Status Former   Packs/day: 1.00   Years: 45.00   Total pack years: 45.00   Types: Cigarettes, Cigars   Quit date: 09/01/2011   Years since quitting: 10.4   Passive exposure: Never  Smokeless Tobacco Never    Review of Systems  Constitutional: Negative.   HENT: Negative.    Eyes:  Positive for blurred vision.  Respiratory:  Positive for cough, shortness of breath and wheezing.   Cardiovascular:  Positive for chest pain and palpitations.  Gastrointestinal:  Positive for abdominal pain.  Genitourinary: Negative.   Musculoskeletal:  Positive for back pain, joint pain and neck pain.  Skin: Negative.   Neurological: Negative.    Endo/Heme/Allergies: Negative.   Psychiatric/Behavioral: Negative.      Objective   Vitals:   02/14/22 0944  BP: 114/77  Pulse: (!) 102  Resp: 14  Temp: 98.1 F (36.7 C)  SpO2: 95%    Physical Exam Vitals reviewed.  Constitutional:      Appearance: Normal appearance. He is normal weight. He is not ill-appearing.  HENT:     Head: Normocephalic and atraumatic.  Cardiovascular:     Rate and Rhythm: Normal rate and regular rhythm.     Heart sounds: Normal heart sounds. No murmur heard.    No friction rub. No gallop.  Pulmonary:     Effort: Pulmonary effort is normal. No respiratory distress.     Breath sounds: Normal breath sounds. No stridor. No wheezing, rhonchi or rales.  Abdominal:     General: Bowel sounds are normal.     Palpations: Abdomen is soft. There is no mass.     Tenderness: There is no abdominal tenderness. There is no guarding.     Hernia: No hernia is present.  Skin:    General: Skin is warm and dry.  Neurological:     Mental Status: He is alert and oriented to person, place, and time.    Colonoscopy report reviewed, CT scan report reviewed Assessment  Colonic neoplasm of unknown etiology, ascending colon.  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  This is a benign and rare lesion.  Workup for it is limited due to the area that set up.  The literature suggests doing an endoscopic ultrasound.  Others recommend either observation or surgical resection.  Patient is at somewhat increased risk for surgical resection due to his cardiac condition.  Patient does not know the results of his recent cardiac stress test.  There are other option for this is doing a repeat colonoscopy 6 months from the time of his original colonoscopy in order to fully assess any change in size or impeachment into the lumen of the colon.  I did tell the patient that I cannot  give him 100% guarantee that is not a malignancy without resection.  He would like to try  to avoid surgery if at all possible.  Will discuss this with Dr. Abbey Chatters.  Awaiting complete cardiac workup. Plan

## 2022-02-14 NOTE — Telephone Encounter (Signed)
Pt notified and agrees with plan of care.

## 2022-02-14 NOTE — Telephone Encounter (Signed)
Patient stopped in and asked that we send his colonoscopy results to [facility].

## 2022-02-14 NOTE — Patient Instructions (Signed)
Cystic lymphangioma

## 2022-02-19 NOTE — Telephone Encounter (Signed)
I called the patient to let him know that I discussed his diagnosis with Dr. Abbey Chatters.  Will repeat colonoscopy in 4 to 6 months to assess any growth.  This area is not amenable to endoscopic ultrasound evaluation.  It appears to be a benign growth.  There is no evidence of metastatic disease on CT scan of the abdomen.  Patient understands and agrees.  Follow-up with me as needed.

## 2022-02-22 ENCOUNTER — Encounter: Payer: Self-pay | Admitting: Podiatry

## 2022-02-22 ENCOUNTER — Ambulatory Visit (INDEPENDENT_AMBULATORY_CARE_PROVIDER_SITE_OTHER): Payer: 59 | Admitting: Podiatry

## 2022-02-22 VITALS — BP 114/70

## 2022-02-22 DIAGNOSIS — E1159 Type 2 diabetes mellitus with other circulatory complications: Secondary | ICD-10-CM | POA: Diagnosis not present

## 2022-02-22 DIAGNOSIS — M2012 Hallux valgus (acquired), left foot: Secondary | ICD-10-CM

## 2022-02-22 DIAGNOSIS — M2042 Other hammer toe(s) (acquired), left foot: Secondary | ICD-10-CM

## 2022-02-22 DIAGNOSIS — M2041 Other hammer toe(s) (acquired), right foot: Secondary | ICD-10-CM | POA: Diagnosis not present

## 2022-02-22 DIAGNOSIS — M2011 Hallux valgus (acquired), right foot: Secondary | ICD-10-CM

## 2022-02-22 DIAGNOSIS — E119 Type 2 diabetes mellitus without complications: Secondary | ICD-10-CM | POA: Diagnosis not present

## 2022-02-22 DIAGNOSIS — M79674 Pain in right toe(s): Secondary | ICD-10-CM

## 2022-02-22 NOTE — Progress Notes (Signed)
ANNUAL DIABETIC FOOT EXAM  Subjective: [name] presents today for annual diabetic foot examination, at risk foot care. Pt has h/o NIDDM with PAD, and painful thick toenails that are difficult to trim. Pain interferes with ambulation. Aggravating factors include wearing enclosed shoe gear. Pain is relieved with periodic professional debridement..  Chief Complaint  Patient presents with   Nail Problem    DFC BS-did not check today A1C-7.9 PCP-Vyas PCP VST-Last week   Patient confirms h/o diabetes.  Patient denies any h/o foot wounds.  Patient has been diagnosed with neuropathy.  Risk factors: diabetes, diabetic neuropathy, foot deformity, PAD, h/o DVT, HTN, COPD, dyslipidemia, h/o tobacco use in remission, RA.  [name], MD is patient's PCP.   Past Medical History:  Diagnosis Date   Anemia    Bronchitis    Carotid artery disease ([place])    Cervical spine disease    Neck surgery in 2007 with Dr. Joya Salm   Collagen vascular disease [facility])    COPD (chronic obstructive pulmonary disease) ([place])    Coronary atherosclerosis of native coronary artery    Nonobstrtuctive by catheterization 2007, 2013, 2021   DVT (deep venous thrombosis) ([place])    Essential hypertension    GERD (gastroesophageal reflux disease)    Hyperlipidemia    PAD (peripheral artery disease) ([place])    s/p left fem-pop bypass 2007; PTA and stenting of right iliac artery 09/2011   Rheumatoid arthritis(714.0)    Type 2 diabetes mellitus ([place])    Patient Active Problem List   Diagnosis Date Noted   Dysphagia 11/05/2021   Iron deficiency anemia 11/05/2021   Symptomatic anemia 10/30/2021   Atheroscler nonbiologic bypass graft left leg w/intermit claudication ([place]) 05/15/2021   Peripheral polyneuropathy 08/15/2020   Ulnar neuropathy of right upper extremity 08/15/2020   Foraminal stenosis of cervical region 07/20/2020   Tremor 02/15/2019   Cervical radiculitis 10/15/2018   Failed back surgical syndrome  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/2018   Opioid dependence ([place]) 10/15/2018   Other long term (current) drug therapy 10/15/2018   Puncture wound of finger of left hand 08/08/2018   Puncture wound of finger, right, complicated, initial encounter 08/05/2018   DOE (dyspnea on exertion) 11/04/2017   Muscle cramps 07/04/2017   Contracture of left elbow 07/04/2017   High risk medication use 06/23/2017   Primary osteoarthritis of both hands 06/23/2017   Primary osteoarthritis of both feet 06/23/2017   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 factor ([place]) 06/23/2017   DDD (degenerative disc disease), cervical 06/23/2017   Rotator cuff syndrome of right shoulder 06/06/2017   History of  bilateral carpal tunnel release 06/06/2017   Ganglion cyst of wrist, left 06/06/2017   Non-insulin dependent type 2 diabetes mellitus ([place]) 06/06/2017   Chest pain with high risk for cardiac etiology 04/05/2017   COPD with acute exacerbation ([place]) 03/13/2017   Plantar fasciitis 02/21/2017   Iliac artery stenosis, right (HCC) 02/12/2017   Black stool 02/10/2017   Abnormal CT of the abdomen 02/10/2017   Cervical spondylosis 10/11/2016   Bony growth 10/13/2015   Pain in joint, lower leg 03/02/2014   Discoloration of skin-Left dorsum foot 09/28/2013   Swelling of limb-Left Calf / Leg 09/28/2013   PVD (peripheral vascular disease) ([place]) 10/14/2012   Pain in limb- Left popliteal and calf 10/14/2012   Dyslipidemia, goal LDL below 70    Essential hypertension 03/05/2012   Secondary cardiomyopathy, 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/12/2011   Generalized osteoarthritis of multiple sites 02/06/2011   History of arterial bypass of  lower limb 12/25/2010   Atherosclerosis of native arteries of extremity with intermittent claudication ([place]) 12/25/2010   Coronary artery calcification seen on CT scan 04/16/2008   COPD  GOLD II 04/16/2008   Gastroesophageal reflux disease 04/16/2008   Rheumatoid arthritis ([place]) 04/16/2008   DIABETES MELLITUS,  BORDERLINE 04/16/2008   Past Surgical History:  Procedure Laterality Date   ABDOMINAL AORTAGRAM N/A 10/03/2011   Procedure: ABDOMINAL Maxcine Ham;  Surgeon: Conrad Kennedy, MD;  Location: [facility] CATH LAB;  Service: Cardiovascular;  Laterality: N/A;   ABDOMINAL AORTOGRAM W/LOWER EXTREMITY N/A 01/16/2017   Procedure: ABDOMINAL AORTOGRAM W/LOWER EXTREMITY;  Surgeon: Conrad Sandborn, MD;  Location: [place] CV LAB;  Service: Cardiovascular;  Laterality: N/A;   ABDOMINAL AORTOGRAM W/LOWER EXTREMITY Bilateral 08/04/2019   Procedure: ABDOMINAL AORTOGRAM W/LOWER EXTREMITY;  Surgeon: Marty Heck, MD;  Location: [place] CV LAB;  Service: Cardiovascular;  Laterality: Bilateral;   ABDOMINAL AORTOGRAM W/LOWER EXTREMITY N/A 03/09/2020   Procedure: ABDOMINAL AORTOGRAM W/LOWER EXTREMITY;  Surgeon: Marty Heck, MD;  Location: [place] CV LAB;  Service: Cardiovascular;  Laterality: N/A;   BIOPSY N/A 12/28/2021   Procedure: BIOPSY;  Surgeon: Eloise Harman, DO;  [facility];  Service: Endoscopy;  Laterality: N/A;   CERVICAL DISC SURGERY     COLONOSCOPY  2013   Hyperplastic polyp   COLONOSCOPY WITH PROPOFOL N/A 02/17/2017   Surgeon: Daneil Dolin, MD; Many small and large mouth diverticula in the sigmoid, descending, transverse colon, internal hemorrhoids that were medium sized and grade 1.   COLONOSCOPY WITH PROPOFOL N/A 12/28/2021   Procedure: COLONOSCOPY WITH PROPOFOL;  Surgeon: Eloise Harman, DO;  [facility];  Service: Endoscopy;  Laterality: N/A;  10:45 am   ESOPHAGOGASTRODUODENOSCOPY (EGD) WITH PROPOFOL N/A 12/28/2021   Procedure: ESOPHAGOGASTRODUODENOSCOPY (EGD) WITH PROPOFOL;  Surgeon: Eloise Harman, DO;  [facility];  Service: Endoscopy;  Laterality: N/A;   FEMORAL-POPLITEAL BYPASS GRAFT  11/06/2005   ILIAC ARTERY STENT  10/03/2011   Right CIA stenting   LEFT HEART CATH AND CORONARY ANGIOGRAPHY N/A 04/14/2019   Procedure: LEFT HEART  CATH AND CORONARY ANGIOGRAPHY;  Surgeon: Troy Sine, MD;  Location: [place] CV LAB;  Service: Cardiovascular;  Laterality: N/A;   PERIPHERAL VASCULAR BALLOON ANGIOPLASTY Right 01/16/2017   Procedure: PERIPHERAL VASCULAR BALLOON ANGIOPLASTY;  Surgeon: Conrad Pettibone, MD;  Location: [place] CV LAB;  Service: Cardiovascular;  Laterality: Right;  common iliac    PERIPHERAL VASCULAR BALLOON ANGIOPLASTY  03/09/2020   Procedure: PERIPHERAL VASCULAR BALLOON ANGIOPLASTY;  Surgeon: Marty Heck, MD;  Location: [place] CV LAB;  Service: Cardiovascular;;   PR VEIN BYPASS GRAFT,AORTO-FEM-POP  06/22/2010   Redo Left Fem-Pop   Repair of left arm fracture     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 90 tablet 3   cilostazol (PLETAL) 100 MG tablet TAKE 1 TABLET(100 MG) BY MOUTH TWICE DAILY 60 tablet 5   clopidogrel (PLAVIX) 75 MG tablet Take 1 tablet (75 mg total) by mouth daily. 30 tablet 11   Dexlansoprazole (DEXILANT) 30 MG capsule Take 30- 60 min before your first and last meals of the day (Patient taking differently: Take 30 mg by mouth daily.) 60 capsule 11   diclofenac Sodium (VOLTAREN)  1 % GEL Apply 2 gram  to painful foot and ankle joint twice daily. 150 g 1   ferrous sulfate 325 (65 FE) MG tablet Take 325 mg by mouth daily with breakfast.     glipiZIDE (GLUCOTROL) 5 MG tablet Take 5 mg by mouth daily.     HYDROcodone-acetaminophen (NORCO) 7.5-325 MG per tablet Take 1-2 tablets by mouth 2 (two) times daily as needed for moderate pain.     JANUVIA 100 MG tablet Take 100 mg by mouth daily.     JARDIANCE 25 MG TABS tablet Take 25 mg by mouth daily.     ketoconazole (NIZORAL) 2 % cream Apply to both feet and between toes once daily for 6 weeks. 60 g 1   losartan (COZAAR) 25 MG tablet Take 25  mg by mouth daily.     metoprolol succinate (TOPROL XL) 25 MG 24 hr tablet Take 0.5 tablets (12.5 mg total) by mouth daily. 45 tablet 3   nitroGLYCERIN (NITROSTAT) 0.4 MG SL tablet Place 1 tablet (0.4 mg total) under the tongue every 5 (five) minutes as needed for chest pain. 25 tablet 3   ONE TOUCH ULTRA TEST test strip      OneTouch Delica Lancets 99991111 MISC Apply topically.     PROAIR HFA 108 (90 BASE) MCG/ACT inhaler Inhale 2 puffs into the lungs every 6 (six) hours as needed for wheezing or shortness of breath.      sildenafil (REVATIO) 20 MG tablet Take 40-60 mg by mouth daily as needed for erectile dysfunction.     SYMBICORT 160-4.5 MCG/ACT inhaler INHALE 2 PUFFS FIRST THING IN THE MORNING AND THEN ANOTHER 2 PUFFS 12 HOURS LATER 10.2 g 11   Tiotropium Bromide Monohydrate (SPIRIVA RESPIMAT) 2.5 MCG/ACT AERS INHALE 2 PUFFS INTO THE LUNGS DAILY 4 g 11   XELJANZ 5 MG TABS TAKE 1 TABLET(5 MG) BY MOUTH DAILY 90 tablet 0   No current facility-administered medications on file prior to visit.    No Known Allergies Social History   Occupational History   Not on file  Tobacco Use   Smoking status: Former    Packs/day: 1.00    Years: 45.00    Total pack years: 45.00    Types: Cigarettes, Cigars    Quit date: 09/01/2011    Years since quitting: 10.4    Passive exposure: Never   Smokeless tobacco: Never  Vaping Use   Vaping Use: Never used  Substance and Sexual Activity   Alcohol use: Not Currently    Alcohol/week: 2.0 standard drinks of alcohol    Types: 2 Cans of beer per week   Drug use: No   Sexual activity: Not Currently    Birth control/protection: None   Family History  Problem Relation Age of Onset   Heart attack Mother 61   Deep vein thrombosis Mother    Diabetes Mother    Hyperlipidemia Mother    Hypertension Mother    Peripheral vascular disease Mother    Stroke Father    Diabetes Father    Hyperlipidemia Father    Hypertension Father    Peripheral vascular disease  Father    Diabetes Sister    Hyperlipidemia Sister    Hypertension Sister    Diabetes Brother    Hyperlipidemia Brother    Cancer Brother    Deep vein thrombosis Brother    Hypertension Brother    Peripheral vascular disease Brother    Hyperlipidemia Son    Cancer Son  Hypertension Son    Diabetes Daughter    Colon cancer Neg Hx    Immunization History  Administered Date(s) Administered   Influenza, High Dose Seasonal PF 10/14/2016, 09/21/2018   Influenza-Unspecified 11/08/2008, 12/10/2019   Moderna Sars-Covid-2 Vaccination 02/19/2019, 03/22/2019, 11/23/2019   PPD Test 09/22/2007   Td (Adult),5 Lf Tetanus Toxid, Preservative Free 02/09/2001   Tdap 02/09/2001, 08/05/2018     Review of Systems: Negative except as noted in the HPI.   Objective: Vitals:   02/22/22 0853  BP: 114/70    [name] is a pleasant 72 y.o. male in NAD. AAO X 3.  Vascular Examination: CFT <4 seconds b/l. DP pulses diminished b/l. PT pulses diminished b/l. Digital hair absent. Skin temperature gradient warm to cool b/l. No ischemia or gangrene. No cyanosis or clubbing noted b/l. No pain with calf compression b/l.   Neurological Examination: Sensation grossly intact b/l with 10 gram monofilament. Vibratory sensation intact b/l. Pt has subjective symptoms of neuropathy.  Dermatological Examination: Pedal skin thin, shiny and atrophic b/l. No open wounds. No interdigital macerations.   Toenails 1-5 b/l thick, discolored, elongated with subungual debris and pain on dorsal palpation.   No hyperkeratotic nor porokeratotic lesions present on today's visit.  Musculoskeletal Examination: Muscle strength 5/5 to b/l LE. No pain, crepitus or joint limitation noted with ROM bilateral LE. HAV with bunion deformity noted b/l LE. Hammertoe deformity noted 2-5 b/l. Patient ambulates independent of any assistive aids.  Radiographs: None  Footwear Assessment: Does the patient wear appropriate shoes?  Yes. Does the patient need inserts/orthotics? No.  ADA Risk Categorization: High Risk  Patient has one or more of the following: Loss of protective sensation Absent pedal pulses Severe Foot deformity History of foot ulcer  Assessment: 1. Pain due to onychomycosis of toenails of both feet   2. Hallux valgus, acquired, bilateral   3. Acquired hammertoes of both feet   4. Type 2 diabetes mellitus with vascular disease ([place])   5. Encounter for diabetic foot exam [facility])     Plan: -Patient was evaluated and treated. All patient's and/or POA's questions/concerns answered on today's visit. -Patient with h/o PAD followed closely by Vascular Team. -Diabetic foot examination performed today. -Continue diabetic foot care principles: inspect feet daily, monitor glucose as recommended by PCP and/or Endocrinologist, and follow prescribed diet per PCP, Endocrinologist and/or dietician. -Continue supportive shoe gear daily. -Toenails 1-5 b/l were debrided in length and girth with sterile nail nippers and dremel without iatrogenic bleeding.  -Patient/POA to call should there be question/concern in the interim. Return in about 9 weeks (around 04/26/2022).  Marzetta Board, DPM

## 2022-02-28 NOTE — Progress Notes (Deleted)
Office Visit Note  Patient: [name]             Date of Birth: 12-17-1950           MRN: [id number]             PCP: [name], MD Referring: [name], MD Visit Date: 03/14/2022 Occupation: @GUAROCC$ @  Subjective:  No chief complaint on file.   History of Present Illness: [name] is a 72 y.o. male ***     Activities of Daily Living:  Patient reports morning stiffness for *** {minute/hour:19697}.   Patient {ACTIONS;DENIES/REPORTS:21021675::"Denies"} nocturnal pain.  Difficulty dressing/grooming: {ACTIONS;DENIES/REPORTS:21021675::"Denies"} Difficulty climbing stairs: {ACTIONS;DENIES/REPORTS:21021675::"Denies"} Difficulty getting out of chair: {ACTIONS;DENIES/REPORTS:21021675::"Denies"} Difficulty using hands for taps, buttons, cutlery, and/or writing: {ACTIONS;DENIES/REPORTS:21021675::"Denies"}  No Rheumatology ROS completed.   PMFS History:  Patient Active Problem List   Diagnosis Date Noted  . Dysphagia 11/05/2021  . Iron deficiency anemia 11/05/2021  . Symptomatic anemia 10/30/2021  . Atheroscler nonbiologic bypass graft left leg w/intermit claudication ([place]) 05/15/2021  . Peripheral polyneuropathy 08/15/2020  . Ulnar neuropathy of right upper extremity 08/15/2020  . Foraminal stenosis of cervical region 07/20/2020  . Tremor 02/15/2019  . Cervical radiculitis 10/15/2018  . 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 Opioid dependence ([place]) 10/15/2018  . Other long term (current) drug therapy 10/15/2018  . Puncture wound of finger of left hand 08/08/2018  . Puncture wound of finger, right, complicated, initial encounter 08/05/2018  . DOE (dyspnea on exertion) 11/04/2017  . Muscle cramps 07/04/2017  . Contracture of left elbow 07/04/2017  . High risk medication use 06/23/2017  . Primary osteoarthritis of both hands 06/23/2017  . Primary osteoarthritis of both feet 06/23/2017  . 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  factor ([place]) 06/23/2017  . DDD (degenerative disc disease), cervical 06/23/2017  . Rotator cuff syndrome of right shoulder 06/06/2017  . History of  bilateral carpal tunnel release 06/06/2017  . Ganglion cyst of wrist, left 06/06/2017  . Non-insulin dependent type 2 diabetes mellitus ([place]) 06/06/2017  . Chest pain with high risk for cardiac etiology 04/05/2017  . COPD with acute exacerbation ([place]) 03/13/2017  . Plantar fasciitis 02/21/2017  . Iliac artery stenosis, right ([place]) 02/12/2017  . Black stool 02/10/2017  . Abnormal CT of the abdomen 02/10/2017  . Cervical spondylosis 10/11/2016  . Bony growth 10/13/2015  . Pain in joint, lower leg 03/02/2014  . Discoloration of skin-Left dorsum foot 09/28/2013  . Swelling of limb-Left Calf / Leg 09/28/2013  . PVD (peripheral vascular disease) ([place]) 10/14/2012  . Pain in limb- Left popliteal and calf 10/14/2012  . Dyslipidemia, goal LDL below 70   . Essential hypertension 03/05/2012  . 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 Generalized osteoarthritis of multiple sites 02/06/2011  . History of arterial bypass of lower limb 12/25/2010  . Atherosclerosis of native arteries of extremity with intermittent claudication ([place]) 12/25/2010  . Coronary artery calcification seen on CT scan 04/16/2008  . COPD  GOLD II 04/16/2008  . Gastroesophageal reflux disease 04/16/2008  . Rheumatoid arthritis ([place]) 04/16/2008  . DIABETES MELLITUS, BORDERLINE 04/16/2008    Past Medical History:  Diagnosis Date  . Anemia   . Bronchitis   . Carotid artery disease ([place])   . Cervical spine disease    Neck surgery in 2007 with Dr. Joya Salm  . Collagen vascular disease ([place])   . COPD (chronic obstructive pulmonary disease) ([place])   . Coronary atherosclerosis of native coronary artery  Nonobstrtuctive by catheterization 2007, 2013, 2021  . DVT (deep venous thrombosis) ([place])   . Essential hypertension   . GERD (gastroesophageal reflux disease)   .  Hyperlipidemia   . PAD (peripheral artery disease) ([place])    s/p left fem-pop bypass 2007; PTA and stenting of right iliac artery 09/2011  . Rheumatoid arthritis(714.0)   . Type 2 diabetes mellitus (HCC)     Family History  Problem Relation Age of Onset  . Heart attack Mother 49  . Deep vein thrombosis Mother   . Diabetes Mother   . Hyperlipidemia Mother   . Hypertension Mother   . Peripheral vascular disease Mother   . Stroke Father   . Diabetes Father   . Hyperlipidemia Father   . Hypertension Father   . Peripheral vascular disease Father   . Diabetes Sister   . Hyperlipidemia Sister   . Hypertension Sister   . Diabetes Brother   . Hyperlipidemia Brother   . Cancer Brother   . Deep vein thrombosis Brother   . Hypertension Brother   . Peripheral vascular disease Brother   . Hyperlipidemia Son   . Cancer Son   . Hypertension Son   . Diabetes Daughter   . Colon cancer Neg Hx    Past Surgical History:  Procedure Laterality Date  . ABDOMINAL AORTAGRAM N/A 10/03/2011   Procedure: ABDOMINAL AORTAGRAM;  Surgeon: Conrad [place], MD;  Location: [facility] CATH LAB;  Service: Cardiovascular;  Laterality: N/A;  . ABDOMINAL AORTOGRAM W/LOWER EXTREMITY N/A 01/16/2017   Procedure: ABDOMINAL AORTOGRAM W/LOWER EXTREMITY;  Surgeon: Conrad [place], MD;  Location: [place] CV LAB;  Service: Cardiovascular;  Laterality: N/A;  . ABDOMINAL AORTOGRAM W/LOWER EXTREMITY Bilateral 08/04/2019   Procedure: ABDOMINAL AORTOGRAM W/LOWER EXTREMITY;  Surgeon: Marty Heck, MD;  Location: [place] CV LAB;  Service: Cardiovascular;  Laterality: Bilateral;  . ABDOMINAL AORTOGRAM W/LOWER EXTREMITY N/A 03/09/2020   Procedure: ABDOMINAL AORTOGRAM W/LOWER EXTREMITY;  Surgeon: Marty Heck, MD;  Location: [place] CV LAB;  Service: Cardiovascular;  Laterality: N/A;  . BIOPSY N/A 12/28/2021   Procedure: BIOPSY;  Surgeon: Eloise Harman, DO;  [facility];  Service: Endoscopy;   Laterality: N/A;  . CERVICAL DISC SURGERY    . COLONOSCOPY  2013   Hyperplastic polyp  . COLONOSCOPY WITH PROPOFOL N/A 02/17/2017   Surgeon: Daneil Dolin, MD; Many small and large mouth diverticula in the sigmoid, descending, transverse colon, internal hemorrhoids that were medium sized and grade 1.  . COLONOSCOPY WITH PROPOFOL N/A 12/28/2021   Procedure: COLONOSCOPY WITH PROPOFOL;  Surgeon: Eloise Harman, DO;  [facility];  Service: Endoscopy;  Laterality: N/A;  10:45 am  . ESOPHAGOGASTRODUODENOSCOPY (EGD) WITH PROPOFOL N/A 12/28/2021   Procedure: ESOPHAGOGASTRODUODENOSCOPY (EGD) WITH PROPOFOL;  Surgeon: Eloise Harman, DO;  [facility];  Service: Endoscopy;  Laterality: N/A;  . FEMORAL-POPLITEAL BYPASS GRAFT  11/06/2005  . ILIAC ARTERY STENT  10/03/2011   Right CIA stenting  . LEFT HEART CATH AND CORONARY ANGIOGRAPHY N/A 04/14/2019   Procedure: LEFT HEART CATH AND CORONARY ANGIOGRAPHY;  Surgeon: Troy Sine, MD;  Location: [place] CV LAB;  Service: Cardiovascular;  Laterality: N/A;  . PERIPHERAL VASCULAR BALLOON ANGIOPLASTY Right 01/16/2017   Procedure: PERIPHERAL VASCULAR BALLOON ANGIOPLASTY;  Surgeon: Conrad [place], MD;  Location: [place] CV LAB;  Service: Cardiovascular;  Laterality: Right;  common iliac   . PERIPHERAL VASCULAR BALLOON ANGIOPLASTY  03/09/2020   Procedure: PERIPHERAL  VASCULAR BALLOON ANGIOPLASTY;  Surgeon: Marty Heck, MD;  Location: [place] CV LAB;  Service: Cardiovascular;;  . PR VEIN BYPASS GRAFT,AORTO-FEM-POP  06/22/2010   Redo Left Fem-Pop  . Repair of left arm fracture    . 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 SURGERY SCROTAL / TESTICULAR     Social History   Social History Narrative   The patient lives in [place].    Immunization History  Administered Date(s) Administered  . Influenza, High Dose Seasonal PF 10/14/2016, 09/21/2018  . Influenza-Unspecified 11/08/2008, 12/10/2019  . Moderna Sars-Covid-2  Vaccination 02/19/2019, 03/22/2019, 11/23/2019  . PPD Test 09/22/2007  . Td (Adult),5 Lf Tetanus Toxid, Preservative Free 02/09/2001  . Tdap 02/09/2001, 08/05/2018     Objective: Vital Signs: There were no vitals taken for this visit.   Physical Exam   Musculoskeletal Exam: ***  CDAI Exam: CDAI Score: -- Patient Global: --; Provider Global: -- Swollen: --; Tender: -- Joint Exam 03/14/2022   No joint exam has been documented for this visit   There is currently no information documented on the homunculus. Go to the Rheumatology activity and complete the homunculus joint exam.  Investigation: No additional findings.  Imaging: No results found.  Recent Labs: Lab Results  Component Value Date   WBC 4.4 12/28/2021   HGB 11.8 (L) 12/28/2021   PLT 306 12/28/2021   NA 141 09/11/2021   K 4.5 09/11/2021   CL 106 09/11/2021   CO2 26 09/11/2021   GLUCOSE 80 09/11/2021   BUN 17 09/11/2021   CREATININE 1.00 01/04/2022   BILITOT 0.4 09/11/2021   ALKPHOS 79 01/25/2021   AST 17 09/11/2021   ALT 12 09/11/2021   PROT 7.4 09/11/2021   ALBUMIN 4.5 01/25/2021   CALCIUM 9.3 09/11/2021   GFRAA 83 01/27/2020   QFTBGOLDPLUS NEGATIVE 12/11/2021    Speciality Comments: Prior therapy includes: methotrexate ( d/c alcohol use), Arava (d/c patient preference), [place] (patient declined)  Procedures:  No procedures performed Allergies: Patient has no known allergies.   Assessment / Plan:     Visit Diagnoses: 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 factor (HCC)  High risk medication use  Rotator cuff syndrome of right shoulder  Contracture of joint of both elbows  Primary osteoarthritis of both hands  History of  bilateral carpal tunnel release  Chronic pain of left knee  Primary osteoarthritis of both feet  Pain in left ankle and joints of left foot  DDD (degenerative disc disease), cervical  Mixed hyperlipidemia  Essential hypertension  COPD   GOLD II  Atherosclerosis of native coronary artery of native heart without angina pectoris  History of diabetes mellitus  PVD (peripheral vascular disease) ([place])  Orders: No orders of the defined types were placed in this encounter.  No orders of the defined types were placed in this encounter.   Face-to-face time spent with patient was *** minutes. Greater than 50% of time was spent in counseling and coordination of care.  Follow-Up Instructions: No follow-ups on file.   Ofilia Neas, PA-C  Note - This record has been created using Dragon software.  Chart creation errors have been sought, but may not always  have been located. Such creation errors do not reflect on  the standard of medical care.

## 2022-03-02 ENCOUNTER — Other Ambulatory Visit: Payer: Self-pay | Admitting: Medical

## 2022-03-04 ENCOUNTER — Ambulatory Visit: Payer: 59 | Attending: Medical | Admitting: Nurse Practitioner

## 2022-03-04 ENCOUNTER — Ambulatory Visit: Payer: 59 | Admitting: Medical

## 2022-03-04 ENCOUNTER — Encounter: Payer: Self-pay | Admitting: Nurse Practitioner

## 2022-03-04 VITALS — BP 118/76 | HR 88 | Ht 69.0 in | Wt 165.8 lb

## 2022-03-04 DIAGNOSIS — I10 Essential (primary) hypertension: Secondary | ICD-10-CM | POA: Diagnosis not present

## 2022-03-04 DIAGNOSIS — R002 Palpitations: Secondary | ICD-10-CM | POA: Diagnosis not present

## 2022-03-04 DIAGNOSIS — R0789 Other chest pain: Secondary | ICD-10-CM

## 2022-03-04 DIAGNOSIS — Z79899 Other long term (current) drug therapy: Secondary | ICD-10-CM | POA: Diagnosis not present

## 2022-03-04 DIAGNOSIS — E785 Hyperlipidemia, unspecified: Secondary | ICD-10-CM

## 2022-03-04 DIAGNOSIS — I471 Supraventricular tachycardia, unspecified: Secondary | ICD-10-CM

## 2022-03-04 DIAGNOSIS — I77810 Thoracic aortic ectasia: Secondary | ICD-10-CM

## 2022-03-04 DIAGNOSIS — I5022 Chronic systolic (congestive) heart failure: Secondary | ICD-10-CM | POA: Diagnosis not present

## 2022-03-04 DIAGNOSIS — I739 Peripheral vascular disease, unspecified: Secondary | ICD-10-CM

## 2022-03-04 DIAGNOSIS — R0989 Other specified symptoms and signs involving the circulatory and respiratory systems: Secondary | ICD-10-CM

## 2022-03-04 MED ORDER — METOPROLOL SUCCINATE ER 25 MG PO TB24: 25.0000 mg | ORAL_TABLET | Freq: Every day | ORAL | 1 refills | Status: DC

## 2022-03-04 MED ORDER — NITROGLYCERIN 0.4 MG SL SUBL: 0.4000 mg | SUBLINGUAL_TABLET | SUBLINGUAL | 3 refills | Status: AC | PRN

## 2022-03-04 NOTE — Progress Notes (Signed)
Cardiology Office Note:    Date:  03/04/2022   ID:  [name], DOB [birth year], MRN [id number]  PCP:  [name], MD   [place] [facility] Cardiologist:  Rozann Lesches, MD     Referring MD: [name], MD   CC: Here for 6-8 week follow-up for HFmrEF  History of Present Illness:    [name] is a 72 y.o. male with a hx of the following:   HFmrEF Palpitations, PSVT HLD PAD Carotid artery disease COPD T2DM RA HLD HTN GERD Hx of DVT  Patient is a 72 year old male with past medical history as mentioned above.  Previous cardiovascular history of nonobstructive CAD by cardiac catheterization as seen in 2007, 2013, and 2021.  He is closely followed by [facility] for history of PAD, status post left fem-pop bypass in 2007, with PTCA and stenting of right iliac artery in 2013.  Recent cardiac monitor demonstrated brief episodes of PSVT.  Was started on beta-blocker.  Echocardiogram revealed mildly reduced left ventricular systolic function at 45 to 50%, mild apical septal hypokinesis.  Last seen by Dr. Domenic Polite on January 17, 2022.  Was doing well at that time.  Claudication was stable, symptoms noted more on right than left.  Lexiscan Myoview was arranged and revealed findings consistent with inferior wall infarct scar and moderate peri-infarct ischemia, EF measured at 53%.  Patient did not take beta-blocker as instructed from previous office visit, Dr. Domenic Polite started Toprol-XL 12.5 mg daily.   Today he presents for 6 to 8-week follow-up.  He presents with several questions and concerns. 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 Has recently started taking metoprolol and heart palpitations have stopped. Does admit to CP noticed recently after doing arm exercises at gym, denies any exertional chest pain symptoms. Denies any shortness of breath, syncope, presyncope, dizziness,  orthopnea, PND, swelling or significant weight changes, acute bleeding, or claudication. Tolerating his medications well.   SH: Very active and exercises every day.   Past Medical History:  Diagnosis Date   Anemia    Bronchitis    Carotid artery disease ([place])    Cervical spine disease    Neck surgery in 2007 with Dr. Joya Salm   Collagen vascular disease [facility])    COPD (chronic obstructive pulmonary disease) ([place])    Coronary atherosclerosis of native coronary artery    Nonobstrtuctive by catheterization 2007, 2013, 2021   DVT (deep venous thrombosis) ([place])    Essential hypertension    GERD (gastroesophageal reflux disease)    Hyperlipidemia    PAD (peripheral artery disease) ([place])    s/p left fem-pop bypass 2007; PTA and stenting of right iliac artery 09/2011   Rheumatoid arthritis(714.0)    Type 2 diabetes mellitus ([place])     Past Surgical History:  Procedure Laterality Date   ABDOMINAL AORTAGRAM N/A 10/03/2011   Procedure: ABDOMINAL Maxcine Ham;  Surgeon: Conrad Parklawn, MD;  Location: [facility] CATH LAB;  Service: Cardiovascular;  Laterality: N/A;   ABDOMINAL AORTOGRAM W/LOWER EXTREMITY N/A 01/16/2017   Procedure: ABDOMINAL AORTOGRAM W/LOWER EXTREMITY;  Surgeon: Conrad Thayer, MD;  Location: [place] CV LAB;  Service: Cardiovascular;  Laterality: N/A;   ABDOMINAL AORTOGRAM W/LOWER EXTREMITY Bilateral 08/04/2019   Procedure: ABDOMINAL AORTOGRAM W/LOWER EXTREMITY;  Surgeon: Marty Heck, MD;  Location: [place] CV LAB;  Service: Cardiovascular;  Laterality: Bilateral;   ABDOMINAL AORTOGRAM W/LOWER EXTREMITY N/A 03/09/2020   Procedure: ABDOMINAL AORTOGRAM  W/LOWER EXTREMITY;  Surgeon: Marty Heck, MD;  Location: [place] CV LAB;  Service: Cardiovascular;  Laterality: N/A;   BIOPSY N/A 12/28/2021   Procedure: BIOPSY;  Surgeon: Eloise Harman, DO;  [facility] ENDO SU[facility];  Service: Endoscopy;  Laterality: N/A;   CERVICAL DISC SURGERY     COLONOSCOPY  2013    Hyperplastic polyp   COLONOSCOPY WITH PROPOFOL N/A 02/17/2017   Surgeon: Daneil Dolin, MD; Many small and large mouth diverticula in the sigmoid, descending, transverse colon, internal hemorrhoids that were medium sized and grade 1.   COLONOSCOPY WITH PROPOFOL N/A 12/28/2021   Procedure: COLONOSCOPY WITH PROPOFOL;  Surgeon: Eloise Harman, DO;  [facility];  Service: Endoscopy;  Laterality: N/A;  10:45 am   ESOPHAGOGASTRODUODENOSCOPY (EGD) WITH PROPOFOL N/A 12/28/2021   Procedure: ESOPHAGOGASTRODUODENOSCOPY (EGD) WITH PROPOFOL;  Surgeon: Eloise Harman, DO;  [facility];  Service: Endoscopy;  Laterality: N/A;   FEMORAL-POPLITEAL BYPASS GRAFT  11/06/2005   ILIAC ARTERY STENT  10/03/2011   Right CIA stenting   LEFT HEART CATH AND CORONARY ANGIOGRAPHY N/A 04/14/2019   Procedure: LEFT HEART CATH AND CORONARY ANGIOGRAPHY;  Surgeon: Troy Sine, MD;  Location: [place] CV LAB;  Service: Cardiovascular;  Laterality: N/A;   PERIPHERAL VASCULAR BALLOON ANGIOPLASTY Right 01/16/2017   Procedure: PERIPHERAL VASCULAR BALLOON ANGIOPLASTY;  Surgeon: Conrad Magee, MD;  Location: [place] CV LAB;  Service: Cardiovascular;  Laterality: Right;  common iliac    PERIPHERAL VASCULAR BALLOON ANGIOPLASTY  03/09/2020   Procedure: PERIPHERAL VASCULAR BALLOON ANGIOPLASTY;  Surgeon: Marty Heck, MD;  Location: [place] CV LAB;  Service: Cardiovascular;;   PR VEIN BYPASS GRAFT,AORTO-FEM-POP  06/22/2010   Redo Left Fem-Pop   Repair of left arm fracture     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   diclofenac Sodium (VOLTAREN) 1 % GEL Apply 2 gram  to painful foot and ankle joint twice daily.   glipiZIDE (GLUCOTROL) 5 MG tablet  Take 5 mg by mouth daily.   HYDROcodone-acetaminophen (NORCO) 7.5-325 MG per tablet Take 1-2 tablets by mouth 2 (two) times daily as needed for moderate pain.   JANUVIA 100 MG tablet Take 100 mg by mouth daily.   JARDIANCE 25 MG TABS tablet Take 25 mg by mouth daily.   ketoconazole (NIZORAL) 2 % cream Apply to both feet and between toes once daily for 6 weeks.   losartan (COZAAR) 25 MG tablet Take 25 mg by mouth daily.   Omega-3 Fatty Acids (OMEGA-3 FISH OIL PO) Take 1 capsule by mouth daily.   ONE TOUCH ULTRA TEST test strip    OneTouch Delica Lancets 99991111 MISC Apply topically.   PROAIR HFA 108 (90 BASE) MCG/ACT inhaler Inhale 2 puffs into the lungs every 6 (six) hours as needed for wheezing or shortness of breath.    sildenafil (REVATIO) 20 MG tablet Take 40-60 mg by mouth daily as needed for erectile dysfunction.   SYMBICORT 160-4.5 MCG/ACT inhaler INHALE 2 PUFFS FIRST THING IN THE MORNING AND THEN ANOTHER 2 PUFFS 12 HOURS LATER   Tiotropium Bromide Monohydrate (SPIRIVA RESPIMAT) 2.5 MCG/ACT AERS INHALE 2 PUFFS INTO THE LUNGS DAILY   XELJANZ 5  MG TABS TAKE 1 TABLET(5 MG) BY MOUTH DAILY    atorvastatin (LIPITOR) 80 MG tablet Take 1 tablet (80 mg total) by mouth daily.   metoprolol succinate (TOPROL XL) 25 MG 24 hr tablet Take 0.5 tablets (12.5 mg total) by mouth daily.     Allergies:   Patient has no known allergies.   Social History   Socioeconomic History   Marital status: Married    Spouse name: Not on file   Number of children: Not on file   Years of education: Not on file   Highest education level: Not on file  Occupational History   Not on file  Tobacco Use   Smoking status: Former    Packs/day: 1.00    Years: 45.00    Total pack years: 45.00    Types: Cigarettes, Cigars    Quit date: 09/01/2011    Years since quitting: 10.5    Passive exposure: Never   Smokeless tobacco: Never  Vaping Use   Vaping Use: Never used  Substance and Sexual Activity   Alcohol use: Not  Currently    Alcohol/week: 2.0 standard drinks of alcohol    Types: 2 Cans of beer per week   Drug use: No   Sexual activity: Not Currently    Birth control/protection: None  Other Topics Concern   Not on file  Social History Narrative   The patient lives in [place].    Social Determinants of Health   Financial Resource Strain: Not on file  Food Insecurity: Not on file  Transportation Needs: Not on file  Physical Activity: Not on file  Stress: Not on file  Social Connections: Not on file     Family History: The patient's family history includes Cancer in his brother and son; Deep vein thrombosis in his brother and mother; Diabetes in his brother, daughter, father, mother, and sister; Heart attack (age of onset: 46) in his mother; Hyperlipidemia in his brother, father, mother, sister, and son; Hypertension in his brother, father, mother, sister, and son; Peripheral vascular disease in his brother, father, and mother; Stroke in his father. There is no history of Colon cancer.  ROS:   Please see the history of present illness.     All other systems reviewed and are negative.  EKGs/Labs/Other Studies Reviewed:    The following studies were reviewed today:   EKG:  EKG is not ordered today.    Myoview on 01/24/2022:    Findings are consistent with prior inferior infarction with mild peri-infarct ischemia. The study is low risk.   No ST deviation was noted.   LV perfusion is abnormal. Moderate size moderate intensity inferior defect with mild reversibility.   Left ventricular function is normal. Nuclear stress EF: 53 %. The left ventricular ejection fraction is normal (55-65%). End diastolic cavity size is normal.      Echocardiogram on 01/10/2022:  1. Left ventricular ejection fraction, by estimation, is 45 to 50%. The  left ventricle has mildly decreased function. The left ventricle  demonstrates regional wall motion abnormalities (see scoring  diagram/findings for description).  There is mild  concentric left ventricular hypertrophy. Left ventricular diastolic  parameters are consistent with Grade I diastolic dysfunction (impaired  relaxation).   2. Right ventricular systolic function is normal. The right ventricular  size is normal.   3. Left atrial size was upper normal.   4. The mitral valve is grossly normal. Trivial mitral valve  regurgitation.   5. The aortic valve is tricuspid. There  is mild calcification of the  aortic valve. Aortic valve regurgitation is mild. Aortic regurgitation PHT  measures 531 msec. Aortic valve mean gradient measures 8.0 mmHg.   6. Aortic dilatation noted. There is borderline dilatation of the aortic  root, measuring 39 mm.   7. The inferior vena cava is normal in size with greater than 50%  respiratory variability, suggesting right atrial pressure of 3 mmHg.   Comparison(s): Prior images unable to be directly viewed. LVEF reported at  50% at angiography in 2021.  Monitor on 01/01/2022:  Patient had a min HR of 59 bpm (sinus bradycardia), max HR of 203 bpm (1.9 second duration of brief supraventricular tachycardia), and avg HR of 101 bpm (sinus tachycardia). Predominant underlying rhythm was Sinus Rhythm. 6 Supraventricular Tachycardia runs occurred, the run with the fastest interval lasting 5 beats (1.9 second duration) with a max rate of 203 bpm, the longest lasting 19 beats (6.8 seconds) with an avg rate of 163 bpm. Isolated SVEs were occasional (1.4%, 13864), SVE Couplets were rare (<1.0%, 88), and SVE Triplets were rare (<1.0%, 5). Isolated VEs were rare (<1.0%, 3894), VE Couplets were rare (<1.0%, 22), and VE Triplets were rare (<1.0%, 1). Ventricular Bigeminy was present.    Impression: Brief supraventricular tachycardia detected (6 episodes in 7 days; longest duration 6.8 seconds).  Occasional PACs (1.4% burden).   Cardiac cath on 04/14/2019:  Prox LAD to Mid LAD lesion is 20% stenosed. Mid LAD lesion is 30% stenosed. Prox  RCA lesion is 30% stenosed.   Mild non-obstructive CAD with 20 and 30% irregularities in the proximal to mid LAD; normal ramus intermediate and left circumflex coronary arteries;  and RCA with smooth 30% proximal narrowing with distal vessel tortuosity.   Low normal global LV function with EF estimated at 50%; LVEDP 14 mmHg.   RECOMMENDATION: Medical therapy for CAD.  Will increase lipid-lowering therapy to atorvastatin 40 mg.  Maintain optimal blood pressure control with target less than 130/80 and ideal blood pressure less than 120/80.  Target LDL less than 70.  Recent Labs: 09/11/2021: ALT 12; BUN 17; Potassium 4.5; Sodium 141 12/28/2021: Hemoglobin 11.8; Platelets 306 01/04/2022: Creatinine, Ser 1.00  Recent Lipid Panel    Component Value Date/Time   CHOL 146 09/11/2021 0937   CHOL 160 11/09/2019 1032   TRIG 56 09/11/2021 0937   HDL 57 09/11/2021 0937   HDL 61 11/09/2019 1032   CHOLHDL 2.6 09/11/2021 0937   VLDL 17 01/25/2021 1118   LDLCALC 75 09/11/2021 0937     Risk Assessment/Calculations:    The 10-year ASCVD risk score (Arnett DK, et al., 2019) is: 26.7%   Values used to calculate the score:     Age: 42 years     Sex: Male     Is Non-Hispanic African American: Yes     Diabetic: Yes     Tobacco smoker: No     Systolic Blood Pressure: 123456 mmHg     Is BP treated: Yes     HDL Cholesterol: 57 mg/dL     Total Cholesterol: 146 mg/dL  Physical Exam:    VS:  BP 118/76   Pulse 88   Ht '5\' 9"'$  (1.753 m)   Wt 165 lb 12.8 oz (75.2 kg)   SpO2 92%   BMI 24.48 kg/m     Wt Readings from Last 3 Encounters:  03/04/22 165 lb 12.8 oz (75.2 kg)  02/14/22 170 lb (77.1 kg)  01/17/22 168 lb (76.2 kg)  GEN:  Well nourished, well developed in no acute distress HEENT: Normal NECK: No JVD; Right carotid bruit, no left carotid bruit LYMPHATICS: No lymphadenopathy CARDIAC: S1/S2, RRR, no murmurs, rubs, gallops; 2+ pulses RESPIRATORY:  Clear to auscultation without rales,  wheezing or rhonchi  MUSCULOSKELETAL:  No edema; No deformity  SKIN: Warm and dry NEUROLOGIC:  Alert and oriented x 3 PSYCHIATRIC:  Normal affect   ASSESSMENT:    1. Atypical chest pain   2. Heart failure with mildly reduced ejection fraction (HFmrEF) ([place])   3. Medication management   4. Palpitations   5. PSVT (paroxysmal supraventricular tachycardia)   6. Hyperlipidemia, unspecified hyperlipidemia type   7. PAD (peripheral artery disease) ([place])   8. Bruit   9. Essential hypertension, benign   10. Aortic root dilatation ([facility])    PLAN:    In order of problems listed above:  Atypical chest pain Etiology most likely MSK, as noticed only at gym when doing work-outs. Denies any exertional symptoms. NST 01/2022 showed inferior wall infarct scare and moderate per-infarct ischemia. Continue current medication regimen at this time. Recommended Tylenol 1,000 mg BID. Will increase Toprol XL as mentioned below. No other medication changes. Heart healthy diet and regular cardiovascular exercise encouraged. ED precautions discussed. If no improvement in symptoms by next follow-up, consider cardiac catheterization. Will refill Nitroglycerin per his request and discussed not to take Revatio and NG within 48 hours of one another and he verbalized understanding.   HFmrEF TTE 01/2022 showed EF mildly reduced at 45-50%, RWMA, mild LVH, grade 1 DD. Euvolemic and well compensated on exam. Continue GDMT including Jardiance, losartan, and will increase Toprol XL to 25 mg tablet daily. Low sodium diet, fluid restriction <2L, and daily weights encouraged. Educated to contact our office for weight gain of 2 lbs overnight or 5 lbs in one week. Heart healthy diet and regular cardiovascular exercise encouraged. Will obtain Mag and BMET in 1-2 weeks, and if kidney fxn permits will switch losartan to Entresto.    Palpitations, PSVT Reports improvement in palpitations since starting low dose Toprol XL. Will increase  Toprol XL to 1 tablet daily. Continue rest of medication regimen. Will obtain BMET and Mag level in 1-2 weeks. Heart healthy diet and regular cardiovascular exercise encouraged.    HLD, PAD, right carotid bruit LDL 75 5 months ago. He is closely followed by [facility] for history of PAD, status post left fem-pop bypass in 2007, with PTCA and stenting of right iliac artery in 2013. Continue Pletal, Plavix, and Atorvastatin. Heart healthy diet and regular cardiovascular exercise encouraged. Right carotid bruit noted on exam, will arrange carotid dopplers, states he has been told he has "some blockages" in his carotid arteries.   HTN BP well controlled today. Will increase Toprol XL as previously mentioned and continue rest of current medication regimen. Discussed to monitor BP at home at least 2 hours after medications and sitting for 5-10 minutes. Heart healthy diet and regular cardiovascular exercise encouraged.   Aortic root dilatation Borderline dilatation of aortic root noted on Echo, measuring 39 mm. Plan to update study in 1 year or sooner if needed.   Disposition: Follow-up with me or APP in 6 weeks or sooner if anything changes.       Medication Adjustments/Labs and Tests Ordered: Current medicines are reviewed at length with the patient today.  Concerns regarding medicines are outlined above.  Orders Placed This Encounter  Procedures   Basic metabolic panel   Magnesium   VAS  US CAROTID   Meds ordered this encounter  Medications   metoprolol succinate (TOPROL XL) 25 MG 24 hr tablet    Sig: Take 1 tablet (25 mg total) by mouth daily.    Dispense:  90 tablet    Refill:  1    03/04/22 Dose Increase   nitroGLYCERIN (NITROSTAT) 0.4 MG SL tablet    Sig: Place 1 tablet (0.4 mg total) under the tongue every 5 (five) minutes x 3 doses as needed for chest pain (If no relief after 3rd dose, GO TO ER.).    Dispense:  25 tablet    Refill:  3    Patient Instructions  Medication Instructions:   Your physician has recommended you make the following change in your medication:  Increase metoprolol to one tablet by mouth daily Take all other medications as directed  Labwork: BMET, MAG (within one week @ [facility])  Testing/Procedures: Your physician has requested that you have a carotid duplex. This test is an ultrasound of the carotid arteries in your neck. It looks at blood flow through these arteries that supply the brain with blood. Allow one hour for this exam. There are no restrictions or special instructions.   Follow-Up:  Your physician recommends that you schedule a follow-up appointment in: 6 weeks  Any Other Special Instructions Will Be Listed Below (If Applicable).  If you need a refill on your cardiac medications before your next appointment, please call your pharmacy.    Signed, Finis Bud, NP  03/04/2022 3:15 PM    [place]

## 2022-03-04 NOTE — Patient Instructions (Addendum)
Medication Instructions:  Your physician has recommended you make the following change in your medication:  Increase metoprolol to one tablet by mouth daily Take all other medications as directed  Labwork: BMET, MAG (within one week @ [facility])  Testing/Procedures: Your physician has requested that you have a carotid duplex. This test is an ultrasound of the carotid arteries in your neck. It looks at blood flow through these arteries that supply the brain with blood. Allow one hour for this exam. There are no restrictions or special instructions.   Follow-Up:  Your physician recommends that you schedule a follow-up appointment in: 6 weeks  Any Other Special Instructions Will Be Listed Below (If Applicable).  If you need a refill on your cardiac medications before your next appointment, please call your pharmacy.

## 2022-03-06 DIAGNOSIS — E1165 Type 2 diabetes mellitus with hyperglycemia: Secondary | ICD-10-CM | POA: Diagnosis not present

## 2022-03-06 DIAGNOSIS — I10 Essential (primary) hypertension: Secondary | ICD-10-CM | POA: Diagnosis not present

## 2022-03-07 ENCOUNTER — Encounter: Payer: Self-pay | Admitting: Radiology

## 2022-03-07 DIAGNOSIS — E7800 Pure hypercholesterolemia, unspecified: Secondary | ICD-10-CM | POA: Diagnosis not present

## 2022-03-07 DIAGNOSIS — I10 Essential (primary) hypertension: Secondary | ICD-10-CM | POA: Diagnosis not present

## 2022-03-08 ENCOUNTER — Other Ambulatory Visit: Payer: Self-pay | Admitting: Vascular Surgery

## 2022-03-14 ENCOUNTER — Ambulatory Visit: Payer: Medicare Other | Admitting: Physician Assistant

## 2022-03-14 ENCOUNTER — Ambulatory Visit (INDEPENDENT_AMBULATORY_CARE_PROVIDER_SITE_OTHER): Payer: 59 | Admitting: Internal Medicine

## 2022-03-14 ENCOUNTER — Encounter: Payer: Self-pay | Admitting: *Deleted

## 2022-03-14 ENCOUNTER — Encounter: Payer: Self-pay | Admitting: Internal Medicine

## 2022-03-14 VITALS — BP 143/83 | HR 106 | Temp 97.4°F | Ht 69.0 in | Wt 168.8 lb

## 2022-03-14 DIAGNOSIS — R1310 Dysphagia, unspecified: Secondary | ICD-10-CM

## 2022-03-14 DIAGNOSIS — Z8639 Personal history of other endocrine, nutritional and metabolic disease: Secondary | ICD-10-CM

## 2022-03-14 DIAGNOSIS — K219 Gastro-esophageal reflux disease without esophagitis: Secondary | ICD-10-CM | POA: Diagnosis not present

## 2022-03-14 DIAGNOSIS — J449 Chronic obstructive pulmonary disease, unspecified: Secondary | ICD-10-CM

## 2022-03-14 DIAGNOSIS — M25572 Pain in left ankle and joints of left foot: Secondary | ICD-10-CM

## 2022-03-14 DIAGNOSIS — I739 Peripheral vascular disease, unspecified: Secondary | ICD-10-CM

## 2022-03-14 DIAGNOSIS — Z9889 Other specified postprocedural states: Secondary | ICD-10-CM

## 2022-03-14 DIAGNOSIS — I2510 Atherosclerotic heart disease of native coronary artery without angina pectoris: Secondary | ICD-10-CM

## 2022-03-14 DIAGNOSIS — I10 Essential (primary) hypertension: Secondary | ICD-10-CM

## 2022-03-14 DIAGNOSIS — K449 Diaphragmatic hernia without obstruction or gangrene: Secondary | ICD-10-CM | POA: Diagnosis not present

## 2022-03-14 DIAGNOSIS — M5030 Other cervical disc degeneration, unspecified cervical region: Secondary | ICD-10-CM

## 2022-03-14 DIAGNOSIS — M75101 Unspecified rotator cuff tear or rupture of right shoulder, not specified as traumatic: Secondary | ICD-10-CM

## 2022-03-14 DIAGNOSIS — M19071 Primary osteoarthritis, right ankle and foot: Secondary | ICD-10-CM

## 2022-03-14 DIAGNOSIS — M24521 Contracture, right elbow: Secondary | ICD-10-CM

## 2022-03-14 DIAGNOSIS — D490 Neoplasm of unspecified behavior of digestive system: Secondary | ICD-10-CM

## 2022-03-14 DIAGNOSIS — B9681 Helicobacter pylori [H. pylori] as the cause of diseases classified elsewhere: Secondary | ICD-10-CM | POA: Diagnosis not present

## 2022-03-14 DIAGNOSIS — M0609 Rheumatoid arthritis without rheumatoid factor, multiple sites: Secondary | ICD-10-CM

## 2022-03-14 DIAGNOSIS — K2970 Gastritis, unspecified, without bleeding: Secondary | ICD-10-CM

## 2022-03-14 DIAGNOSIS — E782 Mixed hyperlipidemia: Secondary | ICD-10-CM

## 2022-03-14 DIAGNOSIS — M19041 Primary osteoarthritis, right hand: Secondary | ICD-10-CM

## 2022-03-14 DIAGNOSIS — Z79899 Other long term (current) drug therapy: Secondary | ICD-10-CM

## 2022-03-14 DIAGNOSIS — G8929 Other chronic pain: Secondary | ICD-10-CM

## 2022-03-14 MED ORDER — DEXLANSOPRAZOLE 60 MG PO CPDR: 60.0000 mg | DELAYED_RELEASE_CAPSULE | Freq: Every day | ORAL | 11 refills | Status: AC

## 2022-03-14 NOTE — Patient Instructions (Addendum)
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  Continue on Dexilant daily.  I will increase to 60 mg daily.  We need to perform stool test seen to ensure that you have eradicated H. pylori which is a bacteria of your stomach.  You will need to hold your Dexilant for 10 days before collecting sample.  In this 10-day period, you can take over-the-counter Pepcid ( famotidine).  Do not take Pepto-Bismol during this time.  Follow-up in June.  It was nice seeing you again today.  Dr. Abbey Chatters

## 2022-03-14 NOTE — Progress Notes (Signed)
Referring Provider: [name], MD Primary Care Physician:  [name], MD Primary GI:  Dr. Abbey Chatters  Chief Complaint  Patient presents with   Follow-up    Follow up after colonoscopy/discussion regarding the results    HPI:   [name] is a 72 y.o. male who presents to clinic today for follow-up visit.  History of chronic GERD currently taking Dexilant 30 mg daily.  Was previously on 60 mg daily and his symptoms were improved.  EGD 12/28/2021 with findings suggestive of Barrett's esophagus.  Biopsy showed reflux changes and intestinal metaplasia.  Also noted gastritis with biopsies positive for H. pylori.  He was treated with quad therapy with tetracycline, metronidazole, bismuth, Dexilant.  Today, notes his abdominal pain is improved though he continues to have breakthrough acid reflux on Dexilant 30 mg daily.  Denies any dysphagia, states this is improved.  Colonoscopy performed 12/28/2021 for iron deficiency anemia.  Diverticulosis of the sigmoid and descending colon noted.  He had a 2 to 3 cm submucosal mass in his ascending colon.  Subsequent CT scan which I personally reviewed showed 2.4 x 2.0 cm cystic-appearing mass in the ascending colon.  He had a CT abdomen pelvis in 2019 which also mentioned this though it measured 3 cm at that time.  Interestingly however, subsequent colonoscopy in 2019 did not show a cystic mass. The CT report in 2019 also mention seeing this mass lesion on CT in 2017 as "similar yet smaller solid and cystic lesion."  I discussed case further with radiology and personally reviewed films with them who confirmed likely cystic mass.  No evidence of metastases.  Discussed case with general surgery as well and patient saw Dr. Arnoldo Morale in office.  Recommended surveillance.    Past Medical History:  Diagnosis Date   Anemia    Bronchitis    Carotid artery disease ([place])    Cervical spine disease    Neck surgery in 2007 with Dr. Joya Salm   Collagen  vascular disease [facility])    COPD (chronic obstructive pulmonary disease) ([place])    Coronary atherosclerosis of native coronary artery    Nonobstrtuctive by catheterization 2007, 2013, 2021   DVT (deep venous thrombosis) ([place])    Essential hypertension    GERD (gastroesophageal reflux disease)    Hyperlipidemia    PAD (peripheral artery disease) ([place])    s/p left fem-pop bypass 2007; PTA and stenting of right iliac artery 09/2011   Rheumatoid arthritis(714.0)    Type 2 diabetes mellitus ([place])     Past Surgical History:  Procedure Laterality Date   ABDOMINAL AORTAGRAM N/A 10/03/2011   Procedure: ABDOMINAL Maxcine Ham;  Surgeon: Conrad [place], MD;  Location: [facility] CATH LAB;  Service: Cardiovascular;  Laterality: N/A;   ABDOMINAL AORTOGRAM W/LOWER EXTREMITY N/A 01/16/2017   Procedure: ABDOMINAL AORTOGRAM W/LOWER EXTREMITY;  Surgeon: Conrad [place], MD;  Location: [place] CV LAB;  Service: Cardiovascular;  Laterality: N/A;   ABDOMINAL AORTOGRAM W/LOWER EXTREMITY Bilateral 08/04/2019   Procedure: ABDOMINAL AORTOGRAM W/LOWER EXTREMITY;  Surgeon: Marty Heck, MD;  Location: [place] CV LAB;  Service: Cardiovascular;  Laterality: Bilateral;   ABDOMINAL AORTOGRAM W/LOWER EXTREMITY N/A 03/09/2020   Procedure: ABDOMINAL AORTOGRAM W/LOWER EXTREMITY;  Surgeon: Marty Heck, MD;  Location: [place] CV LAB;  Service: Cardiovascular;  Laterality: N/A;   BIOPSY N/A 12/28/2021   Procedure: BIOPSY;  Surgeon: Eloise Harman, DO;  [facility];  Service: Endoscopy;  Laterality: N/A;   CERVICAL  DISC SURGERY     COLONOSCOPY  2013   Hyperplastic polyp   COLONOSCOPY WITH PROPOFOL N/A 02/17/2017   Surgeon: Daneil Dolin, MD; Many small and large mouth diverticula in the sigmoid, descending, transverse colon, internal hemorrhoids that were medium sized and grade 1.   COLONOSCOPY WITH PROPOFOL N/A 12/28/2021   Procedure: COLONOSCOPY WITH PROPOFOL;  Surgeon: Eloise Harman, DO;   [facility];  Service: Endoscopy;  Laterality: N/A;  10:45 am   ESOPHAGOGASTRODUODENOSCOPY (EGD) WITH PROPOFOL N/A 12/28/2021   Procedure: ESOPHAGOGASTRODUODENOSCOPY (EGD) WITH PROPOFOL;  Surgeon: Eloise Harman, DO;  [facility];  Service: Endoscopy;  Laterality: N/A;   FEMORAL-POPLITEAL BYPASS GRAFT  11/06/2005   ILIAC ARTERY STENT  10/03/2011   Right CIA stenting   LEFT HEART CATH AND CORONARY ANGIOGRAPHY N/A 04/14/2019   Procedure: LEFT HEART CATH AND CORONARY ANGIOGRAPHY;  Surgeon: Troy Sine, MD;  Location: [place] CV LAB;  Service: Cardiovascular;  Laterality: N/A;   PERIPHERAL VASCULAR BALLOON ANGIOPLASTY Right 01/16/2017   Procedure: PERIPHERAL VASCULAR BALLOON ANGIOPLASTY;  Surgeon: Conrad Salem, MD;  Location: [place] CV LAB;  Service: Cardiovascular;  Laterality: Right;  common iliac    PERIPHERAL VASCULAR BALLOON ANGIOPLASTY  03/09/2020   Procedure: PERIPHERAL VASCULAR BALLOON ANGIOPLASTY;  Surgeon: Marty Heck, MD;  Location: [place] [facility] LAB;  Service: Cardiovascular;;   PR VEIN BYPASS GRAFT,AORTO-FEM-POP  06/22/2010   Redo Left Fem-Pop   Repair of left arm fracture     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 30 tablet 11   dexlansoprazole (DEXILANT) 60 MG capsule Take 1 capsule (60 mg total) by mouth daily. 30 capsule 11   diclofenac Sodium (VOLTAREN) 1 % GEL Apply 2 gram  to painful foot and ankle joint twice daily. 150 g 1   glipiZIDE (GLUCOTROL) 5 MG tablet Take 5 mg by mouth daily.     HYDROcodone-acetaminophen (NORCO) 7.5-325 MG per tablet Take 1-2 tablets by mouth 2 (two)  times daily as needed for moderate pain.     JANUVIA 100 MG tablet Take 100 mg by mouth daily.     JARDIANCE 25 MG TABS tablet Take 25 mg by mouth daily.     losartan (COZAAR) 25 MG tablet Take 25 mg by mouth daily.     metoprolol succinate (TOPROL XL) 25 MG 24 hr tablet Take 1 tablet (25 mg total) by mouth daily. 90 tablet 1   nitroGLYCERIN (NITROSTAT) 0.4 MG SL tablet Place 1 tablet (0.4 mg total) under the tongue every 5 (five) minutes x 3 doses as needed for chest pain (If no relief after 3rd dose, GO TO ER.). 25 tablet 3   Omega-3 Fatty Acids (OMEGA-3 FISH OIL PO) Take 1 capsule by mouth daily.     ONE TOUCH ULTRA TEST test strip      OneTouch Delica Lancets 99991111 MISC Apply topically.     PROAIR HFA 108 (90 BASE) MCG/ACT inhaler Inhale 2 puffs into the lungs every 6 (six) hours as needed for wheezing or shortness of breath.  SYMBICORT 160-4.5 MCG/ACT inhaler INHALE 2 PUFFS FIRST THING IN THE MORNING AND THEN ANOTHER 2 PUFFS 12 HOURS LATER 10.2 g 11   Tiotropium Bromide Monohydrate (SPIRIVA RESPIMAT) 2.5 MCG/ACT AERS INHALE 2 PUFFS INTO THE LUNGS DAILY 4 g 11   XELJANZ 5 MG TABS TAKE 1 TABLET(5 MG) BY MOUTH DAILY 90 tablet 0   ferrous sulfate 325 (65 FE) MG tablet Take 325 mg by mouth daily with breakfast. (Patient not taking: Reported on 03/04/2022)     ketoconazole (NIZORAL) 2 % cream Apply to both feet and between toes once daily for 6 weeks. (Patient not taking: Reported on 03/14/2022) 60 g 1   sildenafil (REVATIO) 20 MG tablet Take 40-60 mg by mouth daily as needed for erectile dysfunction. (Patient not taking: Reported on 03/14/2022)     No current facility-administered medications for this visit.    Allergies as of 03/14/2022   (No Known Allergies)    Family History  Problem Relation Age of Onset   Heart attack Mother 62   Deep vein thrombosis Mother    Diabetes Mother    Hyperlipidemia Mother    Hypertension Mother    Peripheral vascular disease Mother    Stroke Father     Diabetes Father    Hyperlipidemia Father    Hypertension Father    Peripheral vascular disease Father    Diabetes Sister    Hyperlipidemia Sister    Hypertension Sister    Diabetes Brother    Hyperlipidemia Brother    Cancer Brother    Deep vein thrombosis Brother    Hypertension Brother    Peripheral vascular disease Brother    Hyperlipidemia Son    Cancer Son    Hypertension Son    Diabetes Daughter    Colon cancer Neg Hx     Social History   Socioeconomic History   Marital status: Married    Spouse name: Not on file   Number of children: Not on file   Years of education: Not on file   Highest education level: Not on file  Occupational History   Not on file  Tobacco Use   Smoking status: Former    Packs/day: 1.00    Years: 45.00    Total pack years: 45.00    Types: Cigarettes, Cigars    Quit date: 09/01/2011    Years since quitting: 10.5    Passive exposure: Never   Smokeless tobacco: Never  Vaping Use   Vaping Use: Never used  Substance and Sexual Activity   Alcohol use: Not Currently    Alcohol/week: 2.0 standard drinks of alcohol    Types: 2 Cans of beer per week   Drug use: No   Sexual activity: Not Currently    Birth control/protection: None  Other Topics Concern   Not on file  Social History Narrative   The patient lives in [place].    Social Determinants of Health   Financial Resource Strain: Not on file  Food Insecurity: Not on file  Transportation Needs: Not on file  Physical Activity: Not on file  Stress: Not on file  Social Connections: Not on file    Subjective: Review of Systems  Constitutional:  Negative for chills and fever.  HENT:  Negative for congestion and hearing loss.   Eyes:  Negative for blurred vision and double vision.  Respiratory:  Negative for cough and shortness of breath.   Cardiovascular:  Negative for chest pain and palpitations.  Gastrointestinal:  Positive for heartburn. Negative  for abdominal pain, blood in stool,  constipation, diarrhea, melena and vomiting.  Genitourinary:  Negative for dysuria and urgency.  Musculoskeletal:  Negative for joint pain and myalgias.  Skin:  Negative for itching and rash.  Neurological:  Negative for dizziness and headaches.  Psychiatric/Behavioral:  Negative for depression. The patient is not nervous/anxious.      Objective: BP (!) 143/83 (BP Location: Right Arm, Patient Position: Sitting, Cuff Size: Normal)   Pulse (!) 106   Temp (!) 97.4 F (36.3 C)   Ht '5\' 9"'$  (1.753 m)   Wt 168 lb 12.8 oz (76.6 kg)   BMI 24.93 kg/m  Physical Exam Constitutional:      Appearance: Normal appearance.  HENT:     Head: Normocephalic and atraumatic.  Eyes:     Extraocular Movements: Extraocular movements intact.     Conjunctiva/sclera: Conjunctivae normal.  Cardiovascular:     Rate and Rhythm: Normal rate and regular rhythm.  Pulmonary:     Effort: Pulmonary effort is normal.     Breath sounds: Normal breath sounds.  Abdominal:     General: Bowel sounds are normal.     Palpations: Abdomen is soft.  Musculoskeletal:        General: Normal range of motion.     Cervical back: Normal range of motion and neck supple.  Skin:    General: Skin is warm.  Neurological:     General: No focal deficit present.     Mental Status: He is alert and oriented to person, place, and time.  Psychiatric:        Mood and Affect: Mood normal.        Behavior: Behavior normal.      Assessment: *Chronic GERD-not well-controlled *Dysphagia-resolved *Cystic mass of ascending colon *Hiatal hernia *H. pylori gastritis  Plan: Chronic GERD not well-controlled.  Previously did better on Dexilant 60 mg daily though currently only taking 30 mg.  Will increase to 60 mg daily.  Take 30 minutes for breakfast.  Will order stool antigen testing to ensure eradication of H. pylori.  Patient counseled to not take PPI or Pepto-Bismol during this 10-day period.  After he collect sample he can  immediately restart his Dexilant.  Etiology of his cystic mass of colon unclear. ?Cystic lymphangioma, fortunately has decreased in size since CT scan in 2019.  Most likely benign. Will continue to monitor.  Discussed case with surgery.  Discussed case with radiology personally.  Repeat CT scan June 2024.  Follow-up in 3 to 4 months.  03/14/2022 11:40 AM   Disclaimer: This note was dictated with voice recognition software. Similar sounding words can inadvertently be transcribed and may not be corrected upon review.

## 2022-03-19 ENCOUNTER — Other Ambulatory Visit (HOSPITAL_COMMUNITY)
Admission: RE | Admit: 2022-03-19 | Discharge: 2022-03-19 | Disposition: A | Discharge status: Home / Self Care | Payer: 59 | Source: Ambulatory Visit | Attending: Rheumatology | Admitting: Rheumatology

## 2022-03-19 ENCOUNTER — Other Ambulatory Visit: Payer: Self-pay | Admitting: *Deleted

## 2022-03-19 ENCOUNTER — Other Ambulatory Visit (HOSPITAL_COMMUNITY)
Admission: RE | Admit: 2022-03-19 | Discharge: 2022-03-19 | Disposition: A | Discharge status: Home / Self Care | Payer: 59 | Source: Ambulatory Visit | Attending: Nurse Practitioner | Admitting: Nurse Practitioner

## 2022-03-19 DIAGNOSIS — Z299 Encounter for prophylactic measures, unspecified: Secondary | ICD-10-CM | POA: Diagnosis not present

## 2022-03-19 DIAGNOSIS — R42 Dizziness and giddiness: Secondary | ICD-10-CM | POA: Diagnosis not present

## 2022-03-19 DIAGNOSIS — Z79899 Other long term (current) drug therapy: Secondary | ICD-10-CM | POA: Insufficient documentation

## 2022-03-19 DIAGNOSIS — H6123 Impacted cerumen, bilateral: Secondary | ICD-10-CM | POA: Diagnosis not present

## 2022-03-19 DIAGNOSIS — I10 Essential (primary) hypertension: Secondary | ICD-10-CM | POA: Diagnosis not present

## 2022-03-19 LAB — CBC WITH DIFFERENTIAL/PLATELET
Abs Immature Granulocytes: 0.01 10*3/uL (ref 0.00–0.07)
Basophils Absolute: 0.1 10*3/uL (ref 0.0–0.1)
Basophils Relative: 1 %
Eosinophils Absolute: 0.1 10*3/uL (ref 0.0–0.5)
Eosinophils Relative: 2 %
HCT: 44.6 % (ref 39.0–52.0)
Hemoglobin: 13.7 g/dL (ref 13.0–17.0)
Immature Granulocytes: 0 %
Lymphocytes Relative: 32 %
Lymphs Abs: 1.7 10*3/uL (ref 0.7–4.0)
MCH: 26.9 pg (ref 26.0–34.0)
MCHC: 30.7 g/dL (ref 30.0–36.0)
MCV: 87.6 fL (ref 80.0–100.0)
Monocytes Absolute: 0.4 10*3/uL (ref 0.1–1.0)
Monocytes Relative: 7 %
Neutro Abs: 3.1 10*3/uL (ref 1.7–7.7)
Neutrophils Relative %: 58 %
Platelets: 316 10*3/uL (ref 150–400)
RBC: 5.09 MIL/uL (ref 4.22–5.81)
RDW: 17.2 % — ABNORMAL HIGH (ref 11.5–15.5)
WBC: 5.3 10*3/uL (ref 4.0–10.5)
nRBC: 0 % (ref 0.0–0.2)

## 2022-03-19 LAB — BASIC METABOLIC PANEL
Anion gap: 9 (ref 5–15)
BUN: 15 mg/dL (ref 8–23)
CO2: 25 mmol/L (ref 22–32)
Calcium: 8.8 mg/dL — ABNORMAL LOW (ref 8.9–10.3)
Chloride: 104 mmol/L (ref 98–111)
Creatinine, Ser: 0.91 mg/dL (ref 0.61–1.24)
GFR, Estimated: >60 mL/min (ref >60)
Glucose, Bld: 141 mg/dL — ABNORMAL HIGH (ref 70–99)
Potassium: 4.1 mmol/L (ref 3.5–5.1)
Sodium: 138 mmol/L (ref 135–145)

## 2022-03-19 LAB — MAGNESIUM: Magnesium: 2 mg/dL (ref 1.7–2.4)

## 2022-03-21 DIAGNOSIS — Z87891 Personal history of nicotine dependence: Secondary | ICD-10-CM | POA: Diagnosis not present

## 2022-03-21 DIAGNOSIS — J449 Chronic obstructive pulmonary disease, unspecified: Secondary | ICD-10-CM | POA: Diagnosis not present

## 2022-03-21 DIAGNOSIS — R52 Pain, unspecified: Secondary | ICD-10-CM | POA: Diagnosis not present

## 2022-03-21 DIAGNOSIS — I10 Essential (primary) hypertension: Secondary | ICD-10-CM | POA: Diagnosis not present

## 2022-03-21 DIAGNOSIS — Z6823 Body mass index (BMI) 23.0-23.9, adult: Secondary | ICD-10-CM | POA: Diagnosis not present

## 2022-03-21 DIAGNOSIS — Z7189 Other specified counseling: Secondary | ICD-10-CM | POA: Diagnosis not present

## 2022-03-21 DIAGNOSIS — E1165 Type 2 diabetes mellitus with hyperglycemia: Secondary | ICD-10-CM | POA: Diagnosis not present

## 2022-03-21 DIAGNOSIS — Z0000 Encounter for general adult medical examination without abnormal findings: Secondary | ICD-10-CM | POA: Diagnosis not present

## 2022-03-21 DIAGNOSIS — Z299 Encounter for prophylactic measures, unspecified: Secondary | ICD-10-CM | POA: Diagnosis not present

## 2022-03-25 ENCOUNTER — Ambulatory Visit: Payer: 59 | Attending: Nurse Practitioner

## 2022-03-25 DIAGNOSIS — R0989 Other specified symptoms and signs involving the circulatory and respiratory systems: Secondary | ICD-10-CM

## 2022-03-26 MED ORDER — ENTRESTO 24-26 MG PO TABS: 1.0000 | ORAL_TABLET | Freq: Two times a day (BID) | ORAL | 0 refills | Status: DC

## 2022-03-28 NOTE — Progress Notes (Unsigned)
Office Visit Note  Patient: [name]             Date of Birth: 1950/10/01           MRN: [id number]             PCP: [name], MD Referring: [name], MD Visit Date: 04/11/2022 Occupation: @GUAROCC @  Subjective:  Mediation monitoring   History of Present Illness: [name] is a 72 y.o. male with history of rheumatoid arthritis, osteoarthritis, and DDD.  Patient is currently taking xeljanz 5 mg by mouth daily.  He continues to tolerate Morrie Sheldon without any side effects and has not missed any doses recently.  He continues to experience intermittent arthralgias and joint stiffness.  He denies any joint swelling.  Patient reports that 2 to 3 weeks ago he had an upper respiratory tract infection at which time he was treated with a course of antibiotics and prednisone.  He did not hold [place] during that time.  Patient reports that he has been following up with his gastroenterologist, podiatrist, and cardiologist on a regular basis. Patient reports that he wore a heart monitor and also underwent an echocardiogram.  He has been started on metoprolol which she has been taking as prescribed.    Activities of Daily Living:  Patient reports morning stiffness for 5 minutes.   Patient Reports nocturnal pain.  Difficulty dressing/grooming: Denies Difficulty climbing stairs: Reports Difficulty getting out of chair: Reports Difficulty using hands for taps, buttons, cutlery, and/or writing: Reports  Review of Systems  Constitutional:  Positive for fatigue.  HENT:  Negative for mouth sores and mouth dryness.   Eyes:  Positive for dryness.  Respiratory:  Positive for shortness of breath.   Cardiovascular:  Positive for chest pain. Negative for palpitations.  Gastrointestinal:  Negative for blood in stool, constipation and diarrhea.  Endocrine: Positive for increased urination.  Genitourinary:  Negative for involuntary urination.  Musculoskeletal:  Positive for joint pain, gait  problem, joint pain, joint swelling, myalgias, muscle weakness, morning stiffness, muscle tenderness and myalgias.  Skin:  Negative for color change, rash, hair loss and sensitivity to sunlight.  Allergic/Immunologic: Negative for susceptible to infections.  Neurological:  Positive for dizziness and headaches.  Hematological:  Negative for swollen glands.  Psychiatric/Behavioral:  Negative for depressed mood and sleep disturbance. The patient is not nervous/anxious.     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   Opioid dependence 10/15/2018   Other long term (current) drug therapy 10/15/2018   Puncture wound of finger of left hand 08/08/2018   Puncture wound of finger, right, complicated, initial encounter 08/05/2018   DOE (dyspnea on exertion) 11/04/2017   Muscle cramps 07/04/2017   Contracture of left elbow 07/04/2017   High risk medication use 06/23/2017   Primary osteoarthritis of both hands 06/23/2017   Primary osteoarthritis of both feet 06/23/2017   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 factor 06/23/2017   DDD (degenerative disc disease), cervical 06/23/2017   Rotator cuff syndrome of right shoulder 06/06/2017   History of  bilateral carpal tunnel  release 06/06/2017   Ganglion cyst of wrist, left 06/06/2017   Non-insulin dependent type 2 diabetes mellitus 06/06/2017   Chest pain with high risk for cardiac etiology 04/05/2017   COPD with acute exacerbation 03/13/2017   Plantar fasciitis 02/21/2017   Iliac artery  stenosis, right 02/12/2017   Black stool 02/10/2017   Abnormal CT of the abdomen 02/10/2017   Cervical spondylosis 10/11/2016   Bony growth 10/13/2015   Pain in joint, lower leg 03/02/2014   Discoloration of skin-Left dorsum foot 09/28/2013   Swelling of limb-Left Calf / Leg 09/28/2013   PVD (peripheral vascular disease) ([place]) 10/14/2012   Pain in limb- Left popliteal and calf 10/14/2012   Dyslipidemia, goal LDL below 70    Essential hypertension 03/05/2012   Secondary cardiomyopathy, 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 Joya Salm   Collagen vascular disease    COPD (chronic obstructive pulmonary disease)    Coronary atherosclerosis of native coronary artery    Nonobstrtuctive by catheterization 2007, 2013, 2021   DVT (deep venous thrombosis)    Essential hypertension    GERD (gastroesophageal reflux disease)    Hyperlipidemia    PAD (peripheral artery disease)    s/p left fem-pop bypass 2007; PTA and stenting of right iliac artery 09/2011   Rheumatoid arthritis(714.0)    Type 2 diabetes mellitus     Family History  Problem Relation Age of Onset   Heart attack Mother 28   Deep vein thrombosis Mother    Diabetes Mother    Hyperlipidemia Mother    Hypertension Mother    Peripheral vascular disease Mother    Stroke Father    Diabetes Father    Hyperlipidemia Father    Hypertension Father    Peripheral vascular disease Father     Diabetes Sister    Hyperlipidemia Sister    Hypertension Sister    Diabetes Brother    Hyperlipidemia Brother    Cancer Brother    Deep vein thrombosis Brother    Hypertension Brother    Peripheral vascular disease Brother    Hyperlipidemia Son    Cancer Son    Hypertension Son    Diabetes Daughter    Colon cancer Neg Hx    Past Surgical History:  Procedure Laterality Date   ABDOMINAL AORTAGRAM N/A 10/03/2011   Procedure: ABDOMINAL Maxcine Ham;  Surgeon: Conrad Seville, MD;  Location: [facility] CATH LAB;  Service: Cardiovascular;  Laterality: N/A;   ABDOMINAL AORTOGRAM W/LOWER EXTREMITY N/A 01/16/2017   Procedure: ABDOMINAL AORTOGRAM W/LOWER EXTREMITY;  Surgeon: Conrad [place], MD;  Location: [place] [facility] LAB;  Service: Cardiovascular;  Laterality: N/A;   ABDOMINAL AORTOGRAM W/LOWER EXTREMITY Bilateral 08/04/2019   Procedure: ABDOMINAL AORTOGRAM W/LOWER EXTREMITY;  Surgeon: Marty Heck, MD;  Location: [place] [facility] LAB;  Service: Cardiovascular;  Laterality: Bilateral;   ABDOMINAL AORTOGRAM W/LOWER EXTREMITY N/A 03/09/2020   Procedure: ABDOMINAL AORTOGRAM W/LOWER EXTREMITY;  Surgeon: Marty Heck, MD;  Location: [place] [facility] LAB;  Service: Cardiovascular;  Laterality: N/A;   BIOPSY N/A 12/28/2021   Procedure: BIOPSY;  Surgeon: Eloise Harman, DO;  [facility];  Service: Endoscopy;  Laterality: N/A;   CERVICAL DISC SURGERY     COLONOSCOPY  2013   Hyperplastic polyp   COLONOSCOPY WITH PROPOFOL N/A 02/17/2017   Surgeon: Daneil Dolin, MD; Many small and large mouth diverticula in the sigmoid, descending, transverse colon, internal hemorrhoids that were medium sized and grade 1.   COLONOSCOPY WITH PROPOFOL N/A 12/28/2021   Procedure: COLONOSCOPY WITH PROPOFOL;  Surgeon: Eloise Harman, DO;  [facility];  Service: Endoscopy;  Laterality: N/A;  10:45 am   ESOPHAGOGASTRODUODENOSCOPY (EGD) WITH PROPOFOL N/A 12/28/2021   Procedure:  ESOPHAGOGASTRODUODENOSCOPY (EGD) WITH PROPOFOL;  Surgeon: Eloise Harman, DO;  [facility];  Service: Endoscopy;  Laterality: N/A;   FEMORAL-POPLITEAL BYPASS GRAFT  11/06/2005   ILIAC ARTERY STENT  10/03/2011   Right CIA stenting   LEFT HEART CATH AND CORONARY ANGIOGRAPHY N/A 04/14/2019   Procedure: LEFT HEART CATH AND CORONARY ANGIOGRAPHY;  Surgeon: Troy Sine, MD;  Location: [place] [facility];  Service: Cardiovascular;  Laterality: N/A;   PERIPHERAL VASCULAR BALLOON ANGIOPLASTY Right 01/16/2017   Procedure: PERIPHERAL VASCULAR BALLOON ANGIOPLASTY;  Surgeon: Conrad [place], MD;  Location: [place] [facility];  Service: Cardiovascular;  Laterality: Right;  common iliac    PERIPHERAL VASCULAR BALLOON ANGIOPLASTY  03/09/2020   Procedure: PERIPHERAL VASCULAR BALLOON ANGIOPLASTY;  Surgeon: Marty Heck, MD;  Location: [place] [facility];  Service: Cardiovascular;;   PR VEIN BYPASS GRAFT,AORTO-FEM-POP  06/22/2010   Redo Left Fem-Pop   Repair of left arm fracture     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    Immunization History  Administered Date(s) Administered   Influenza, High Dose Seasonal PF 10/14/2016, 09/21/2018   Influenza-Unspecified 11/08/2008, 12/10/2019   Moderna Sars-Covid-2 Vaccination 02/19/2019, 03/22/2019, 11/23/2019   PPD Test 09/22/2007   Td (Adult),5 Lf Tetanus Toxid, Preservative Free 02/09/2001   Tdap 02/09/2001, 08/05/2018     Objective: Vital Signs: BP 99/68 (BP Location: Left Arm, Patient Position: Sitting, Cuff Size: Normal)   Pulse 84   Resp 16   Wt 166 lb (75.3 kg)   BMI 24.51 kg/m    Physical Exam Vitals and nursing note reviewed.  Constitutional:      Appearance: He is well-developed.  HENT:     Head: Normocephalic and atraumatic.  Eyes:     Conjunctiva/sclera: Conjunctivae normal.     Pupils: Pupils are equal, round, and  reactive to light.  Cardiovascular:     Rate and Rhythm: Normal rate and regular rhythm.     Heart sounds: Normal heart sounds.  Pulmonary:     Effort: Pulmonary effort is normal.     Breath sounds: Normal breath sounds.  Abdominal:     General: Bowel sounds are normal.     Palpations: Abdomen is soft.  Musculoskeletal:     Cervical back: Normal range of motion and neck supple.  Skin:    General: Skin is warm and dry.     Capillary Refill: Capillary refill takes less than 2 seconds.  Neurological:     Mental Status: He is alert and oriented to person, place, and time.  Psychiatric:  Behavior: Behavior normal.      Musculoskeletal Exam: C-spine has limited range of motion without rotation.  Shoulder joints have limited abduction.  Elbow joint flexion contractures noted.  Wrist joints have limited range of motion.  No tenderness or synovitis of the wrist joints, MCPs, PIPs.  PIP and DIP thickening noted.  Limited extension of PIP joints.  Hip joints have slightly limited range of motion but no groin pain currently.  Knee joints have good range of motion with no warmth or effusion.  Ankle joints have good range of motion with no joint tenderness.  CDAI Exam: CDAI Score: -- Patient Global: 2 mm; Provider Global: 2 mm Swollen: --; Tender: -- Joint Exam 04/11/2022   No joint exam has been documented for this visit   There is currently no information documented on the homunculus. Go to the Rheumatology activity and complete the homunculus joint exam.  Investigation: No additional findings.  Imaging: VAS US CAROTID  Result Date: 03/26/2022 Carotid Arterial Duplex Study Patient Name:  NIAM CHASTEEN  Date of Exam:   03/25/2022 Medical Rec #: [id number]      Accession #:    [id number] Date of Birth: 1950-07-23     Patient Gender: M Patient Age:   33 years Exam Location:  [place] Procedure:      VAS US CAROTID Referring Phys: Finis Bud  --------------------------------------------------------------------------------  Indications:       Right bruit. Risk Factors:      Hypertension, hyperlipidemia, Diabetes, past history of                    smoking, coronary artery disease. Comparison Study:  carotid Duplex done 09/22/12 showed the right peak systolic                    velocity to be 70 cm/s and end diastolic velocity to be 16                    cm/s. The left peak systolic velocity was 51 cm/s and end                    diastolic velocity was 14 cm/s. Performing Technologist: Jeneen Montgomery RDMS, RVT, RDCS  Examination Guidelines: A complete evaluation includes B-mode imaging, spectral Doppler, color Doppler, and power Doppler as needed of all accessible portions of each vessel. Bilateral testing is considered an integral part of a complete examination. Limited examinations for reoccurring indications may be performed as noted.  Right Carotid Findings: +----------+--------+--------+--------+-------------------------+--------+           PSV cm/sEDV cm/sStenosisPlaque Description       Comments +----------+--------+--------+--------+-------------------------+--------+ CCA Prox  129     0                                                 +----------+--------+--------+--------+-------------------------+--------+ CCA Distal107     17                                                +----------+--------+--------+--------+-------------------------+--------+ ICA Prox  119     12      1-39%   calcific and heterogenous         +----------+--------+--------+--------+-------------------------+--------+  ICA Mid   113     18                                                +----------+--------+--------+--------+-------------------------+--------+ ICA Distal64      16                                                +----------+--------+--------+--------+-------------------------+--------+ ECA       80      0                                                  +----------+--------+--------+--------+-------------------------+--------+ +----------+--------+-------+----------------+-------------------+           PSV cm/sEDV cmsDescribe        Arm Pressure (mmHG) +----------+--------+-------+----------------+-------------------+ Subclavian177     0      Multiphasic, [id number]                 +----------+--------+-------+----------------+-------------------+ +---------+--------+--+--------+--+---------+ VertebralPSV cm/s44EDV cm/s11Antegrade +---------+--------+--+--------+--+---------+  Left Carotid Findings: +----------+--------+--------+--------+------------------+--------+           PSV cm/sEDV cm/sStenosisPlaque DescriptionComments +----------+--------+--------+--------+------------------+--------+ CCA Prox  133     13                                         +----------+--------+--------+--------+------------------+--------+ CCA Distal98      14                                         +----------+--------+--------+--------+------------------+--------+ ICA Prox  64      9               heterogenous               +----------+--------+--------+--------+------------------+--------+ ICA Mid   83      22      1-39%                              +----------+--------+--------+--------+------------------+--------+ ICA Distal76      22                                         +----------+--------+--------+--------+------------------+--------+ ECA       71      0                                          +----------+--------+--------+--------+------------------+--------+ +----------+--------+--------+----------------+-------------------+           PSV cm/sEDV cm/sDescribe        Arm Pressure (mmHG) +----------+--------+--------+----------------+-------------------+ [id number]     12      Multiphasic, [id number]                  +----------+--------+--------+----------------+-------------------+ +---------+--------+--+--------+--+---------+  VertebralPSV cm/s58EDV cm/s16Antegrade +---------+--------+--+--------+--+---------+   Summary: Right Carotid: Velocities in the right ICA are consistent with a 1-39% stenosis. Left Carotid: Velocities in the left ICA are consistent with a 1-39% stenosis. Vertebrals:  Bilateral vertebral arteries demonstrate antegrade flow. Subclavians: Normal flow hemodynamics were seen in bilateral subclavian              arteries. *See table(s) above for measurements and observations.  Electronically signed by Ida Rogue MD on 03/26/2022 at 7:54:53 AM.    Final     Recent Labs: Lab Results  Component Value Date   WBC 5.3 03/19/2022   HGB 13.7 03/19/2022   PLT 316 03/19/2022   NA 138 03/19/2022   K 4.1 03/19/2022   CL 104 03/19/2022   CO2 25 03/19/2022   GLUCOSE 141 (H) 03/19/2022   BUN 15 03/19/2022   CREATININE 0.91 03/19/2022   BILITOT 0.4 09/11/2021   ALKPHOS 79 01/25/2021   AST 17 09/11/2021   ALT 12 09/11/2021   PROT 7.4 09/11/2021   ALBUMIN 4.5 01/25/2021   CALCIUM 8.8 (L) 03/19/2022   GFRAA 83 01/27/2020   QFTBGOLDPLUS NEGATIVE 12/11/2021    Speciality Comments: Prior therapy includes: methotrexate ( d/c alcohol use), Arava (d/c patient preference), Orenica (patient declined)  Procedures:  No procedures performed Allergies: Patient has no known allergies.   Assessment / Plan:     Visit Diagnoses: 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 factor: He has no synovitis on examination today.  He continues to experience intermittent arthralgias and joint stiffness but has no active inflammation on examination today.  He is prescribed Xeljanz 5 mg 1 tablet by mouth daily.  He is tolerating low-dose Morrie Sheldon without any side effects and has not missed any doses recently.  He has been undergoing a thorough workup by GI and cardiology.  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  He has been following up closely with cardiology after having an echocardiogram revealing mildly reduced left ventricular systolic function and mild apical septal hypokinesis.  He has been started on metoprolol which has been helping with the frequency of palpitations.  Counseled patient that Morrie Sheldon is a JAK inhibitor indicated for Rheumatoid Arthritis. Also reviewed rare adverse effects such as bowel injury and the need to contact us if they develop stomach pain during treatment. Counseled on the increase risk of venous thrombosis. Counseled about FDA black box warning of MACE (major adverse CV events including cardiovascular death, myocardial infarction, and stroke). Patient voiced understanding.  He will notify us if either his cardiologist or gastroenterologist recommends discontinuing Xeljanz.  For now he will remain on low-dose Xeljanz as prescribed.  He was advised to notify us if he develops signs or symptoms of a flare.  He will continue to follow-up closely every 3 months.  High risk medication use - Xeljanz 5 mg 1 tablet by mouth daily.  Previous therapy: Methotrexate discontinued due to alcohol use, Orencia declined by patient, treated with Morrie Sheldon [facility] at [facility].   CBC and CMP updated on 04/03/22. His next lab work will be due in June and every 3 months.  TB gold negative on 12/11/21.  Patient had a recent upper respiratory tract infection 2 to 3 weeks ago at which time he was prescribed a course of antibiotics and a course of prednisone.  He forgot to hold his  Morrie Sheldon while recovering from the infection.  Discussed the importance of holding xeljanz if he develops signs or symptoms of an infection and to resume once the infection has completely cleared.   Rotator cuff syndrome of right shoulder: He continues to have  intermittent discomfort and stiffness in both shoulders.   Contracture of joint of both elbows: Unchanged.  No inflammation noted.   Primary osteoarthritis of both hands: PIP and DIP thickening consistent with OA of both hands.  No inflammation noted.   History of  bilateral carpal tunnel release: Asymptomatic at this time.  Chronic pain of left knee: No warmth or effusion noted.   Primary osteoarthritis of both feet: Intermittent discomfort and stiffness in both ankles and feet. Under care of Dr. Elisha Ponder.  No inflammation noted today.   DDD (degenerative disc disease), cervical: Limited ROM with lateral rotation.   Other medical condition is are listed as follows:  Palpitations: Under care of cardiology.  Reviewed cardiology office visit note from 03/04/2022: Metoprolol dose was increased at that time--palpitations have improved.  Mixed hyperlipidemia: Remains on atorvastatin as prescribed.  Followed by cardiology.  Essential hypertension: Blood pressure was 99/68 today in the office.  Patient was advised to monitor blood pressure closely.  COPD  GOLD II  Atherosclerosis of native coronary artery of native heart without angina pectoris: Under care of cardiology.  History of diabetes mellitus: Patient has diabetic foot exams on a regular basis by podiatry.  PVD (peripheral vascular disease)  Orders: No orders of the defined types were placed in this encounter.  No orders of the defined types were placed in this encounter.     Follow-Up Instructions: Return in about 3 months (around 07/11/2022) for Rheumatoid arthritis, Osteoarthritis, DDD.   Ofilia Neas, PA-C  Note - This record has been created using Dragon software.  Chart creation errors have been sought, but may not always  have been located. Such creation errors do not reflect on  the standard of medical care.

## 2022-03-29 ENCOUNTER — Other Ambulatory Visit (HOSPITAL_COMMUNITY)
Admission: RE | Admit: 2022-03-29 | Discharge: 2022-03-29 | Disposition: A | Discharge status: Home / Self Care | Payer: 59 | Source: Ambulatory Visit | Attending: Internal Medicine | Admitting: Internal Medicine

## 2022-03-29 DIAGNOSIS — B9681 Helicobacter pylori [H. pylori] as the cause of diseases classified elsewhere: Secondary | ICD-10-CM | POA: Insufficient documentation

## 2022-03-29 DIAGNOSIS — K2970 Gastritis, unspecified, without bleeding: Secondary | ICD-10-CM | POA: Insufficient documentation

## 2022-03-31 LAB — H. PYLORI ANTIGEN, STOOL: H. Pylori Stool Ag, Eia: NEGATIVE

## 2022-04-01 DIAGNOSIS — M961 Postlaminectomy syndrome, not elsewhere classified: Secondary | ICD-10-CM | POA: Diagnosis not present

## 2022-04-01 DIAGNOSIS — M47812 Spondylosis without myelopathy or radiculopathy, cervical region: Secondary | ICD-10-CM | POA: Diagnosis not present

## 2022-04-01 DIAGNOSIS — M4802 Spinal stenosis, cervical region: Secondary | ICD-10-CM | POA: Diagnosis not present

## 2022-04-03 DIAGNOSIS — E119 Type 2 diabetes mellitus without complications: Secondary | ICD-10-CM | POA: Diagnosis not present

## 2022-04-03 DIAGNOSIS — R531 Weakness: Secondary | ICD-10-CM | POA: Diagnosis not present

## 2022-04-03 DIAGNOSIS — Z7984 Long term (current) use of oral hypoglycemic drugs: Secondary | ICD-10-CM | POA: Diagnosis not present

## 2022-04-03 DIAGNOSIS — R0602 Shortness of breath: Secondary | ICD-10-CM | POA: Diagnosis not present

## 2022-04-03 DIAGNOSIS — H8110 Benign paroxysmal vertigo, unspecified ear: Secondary | ICD-10-CM | POA: Diagnosis not present

## 2022-04-03 DIAGNOSIS — Z951 Presence of aortocoronary bypass graft: Secondary | ICD-10-CM | POA: Diagnosis not present

## 2022-04-03 DIAGNOSIS — I7090 Unspecified atherosclerosis: Secondary | ICD-10-CM | POA: Diagnosis not present

## 2022-04-03 DIAGNOSIS — I6381 Other cerebral infarction due to occlusion or stenosis of small artery: Secondary | ICD-10-CM | POA: Diagnosis not present

## 2022-04-03 DIAGNOSIS — I10 Essential (primary) hypertension: Secondary | ICD-10-CM | POA: Diagnosis not present

## 2022-04-03 DIAGNOSIS — R4781 Slurred speech: Secondary | ICD-10-CM | POA: Diagnosis not present

## 2022-04-03 DIAGNOSIS — J439 Emphysema, unspecified: Secondary | ICD-10-CM | POA: Diagnosis not present

## 2022-04-03 DIAGNOSIS — Z79899 Other long term (current) drug therapy: Secondary | ICD-10-CM | POA: Diagnosis not present

## 2022-04-03 DIAGNOSIS — R42 Dizziness and giddiness: Secondary | ICD-10-CM | POA: Diagnosis not present

## 2022-04-03 DIAGNOSIS — Z87891 Personal history of nicotine dependence: Secondary | ICD-10-CM | POA: Diagnosis not present

## 2022-04-03 DIAGNOSIS — H8111 Benign paroxysmal vertigo, right ear: Secondary | ICD-10-CM | POA: Diagnosis not present

## 2022-04-04 DIAGNOSIS — R42 Dizziness and giddiness: Secondary | ICD-10-CM | POA: Diagnosis not present

## 2022-04-04 DIAGNOSIS — E1165 Type 2 diabetes mellitus with hyperglycemia: Secondary | ICD-10-CM | POA: Diagnosis not present

## 2022-04-04 DIAGNOSIS — Z299 Encounter for prophylactic measures, unspecified: Secondary | ICD-10-CM | POA: Diagnosis not present

## 2022-04-04 DIAGNOSIS — I10 Essential (primary) hypertension: Secondary | ICD-10-CM | POA: Diagnosis not present

## 2022-04-06 DIAGNOSIS — I10 Essential (primary) hypertension: Secondary | ICD-10-CM | POA: Diagnosis not present

## 2022-04-06 DIAGNOSIS — E1165 Type 2 diabetes mellitus with hyperglycemia: Secondary | ICD-10-CM | POA: Diagnosis not present

## 2022-04-10 DIAGNOSIS — H25813 Combined forms of age-related cataract, bilateral: Secondary | ICD-10-CM | POA: Diagnosis not present

## 2022-04-10 DIAGNOSIS — Z7984 Long term (current) use of oral hypoglycemic drugs: Secondary | ICD-10-CM | POA: Diagnosis not present

## 2022-04-10 DIAGNOSIS — E119 Type 2 diabetes mellitus without complications: Secondary | ICD-10-CM | POA: Diagnosis not present

## 2022-04-11 ENCOUNTER — Ambulatory Visit: Payer: 59 | Attending: Physician Assistant | Admitting: Physician Assistant

## 2022-04-11 ENCOUNTER — Encounter: Payer: Self-pay | Admitting: Physician Assistant

## 2022-04-11 VITALS — BP 99/68 | HR 84 | Resp 16 | Wt 166.0 lb

## 2022-04-11 DIAGNOSIS — M25562 Pain in left knee: Secondary | ICD-10-CM

## 2022-04-11 DIAGNOSIS — Z9889 Other specified postprocedural states: Secondary | ICD-10-CM

## 2022-04-11 DIAGNOSIS — I10 Essential (primary) hypertension: Secondary | ICD-10-CM | POA: Diagnosis not present

## 2022-04-11 DIAGNOSIS — M75101 Unspecified rotator cuff tear or rupture of right shoulder, not specified as traumatic: Secondary | ICD-10-CM

## 2022-04-11 DIAGNOSIS — J449 Chronic obstructive pulmonary disease, unspecified: Secondary | ICD-10-CM

## 2022-04-11 DIAGNOSIS — R002 Palpitations: Secondary | ICD-10-CM

## 2022-04-11 DIAGNOSIS — I739 Peripheral vascular disease, unspecified: Secondary | ICD-10-CM

## 2022-04-11 DIAGNOSIS — M24521 Contracture, right elbow: Secondary | ICD-10-CM

## 2022-04-11 DIAGNOSIS — Z8639 Personal history of other endocrine, nutritional and metabolic disease: Secondary | ICD-10-CM

## 2022-04-11 DIAGNOSIS — M24522 Contracture, left elbow: Secondary | ICD-10-CM

## 2022-04-11 DIAGNOSIS — M0609 Rheumatoid arthritis without rheumatoid factor, multiple sites: Secondary | ICD-10-CM

## 2022-04-11 DIAGNOSIS — M19041 Primary osteoarthritis, right hand: Secondary | ICD-10-CM | POA: Diagnosis not present

## 2022-04-11 DIAGNOSIS — M19071 Primary osteoarthritis, right ankle and foot: Secondary | ICD-10-CM

## 2022-04-11 DIAGNOSIS — G8929 Other chronic pain: Secondary | ICD-10-CM

## 2022-04-11 DIAGNOSIS — Z79899 Other long term (current) drug therapy: Secondary | ICD-10-CM | POA: Diagnosis not present

## 2022-04-11 DIAGNOSIS — M19072 Primary osteoarthritis, left ankle and foot: Secondary | ICD-10-CM

## 2022-04-11 DIAGNOSIS — I2510 Atherosclerotic heart disease of native coronary artery without angina pectoris: Secondary | ICD-10-CM

## 2022-04-11 DIAGNOSIS — M5030 Other cervical disc degeneration, unspecified cervical region: Secondary | ICD-10-CM

## 2022-04-11 DIAGNOSIS — E782 Mixed hyperlipidemia: Secondary | ICD-10-CM

## 2022-04-11 DIAGNOSIS — M19042 Primary osteoarthritis, left hand: Secondary | ICD-10-CM

## 2022-04-11 NOTE — Patient Instructions (Signed)
Standing Labs We placed an order today for your standing lab work.   Please have your standing labs drawn in June and every 3 months   Please have your labs drawn 2 weeks prior to your appointment so that the provider can discuss your lab results at your appointment, if possible.  Please note that you may see your imaging and lab results in MyChart before we have reviewed them. We will contact you once all results are reviewed. Please allow our office up to 72 hours to thoroughly review all of the results before contacting the office for clarification of your results.  WALK-IN LAB HOURS  Monday through Thursday from 8:00 am -12:30 pm and 1:00 pm-5:00 pm and Friday from 8:00 am-12:00 pm.  Patients with office visits requiring labs will be seen before walk-in labs.  You may encounter longer than normal wait times. Please allow additional time. Wait times may be shorter on  Monday and Thursday afternoons.  We do not book appointments for walk-in labs. We appreciate your patience and understanding with our staff.   Labs are drawn by [facility]. Please bring your co-pay at the time of your lab draw.  You may receive a bill from [facility] for your lab work.  Please note if you are on Hydroxychloroquine and and an order has been placed for a Hydroxychloroquine level,  you will need to have it drawn 4 hours or more after your last dose.  If you wish to have your labs drawn at another location, please call the office 24 hours in advance so we can fax the orders.  The office is located at [address], [address], [place], [place] [address]   If you have any questions regarding directions or hours of operation,  please call [phone].   As a reminder, please drink plenty of water prior to coming for your lab work. Thanks!  

## 2022-04-14 NOTE — Progress Notes (Unsigned)
Subjective:    Patient ID: [name], male   DOB: January 31, 1950    MRN: [id number]   Brief patient profile:  54  yobm quit smoking 2013 with dx of copd GOLD II with reversibility on prn inhalers didn't really use it much and "worse over the years" and placed on BREO but worse fall  2018 so placed on Trelegy and referred to pulmonary clinic 01/28/2017 by Dr   Sherril Croon    History of Present Illness  01/28/2017 1st [facility] office visit/ Judiann Celia  ? Copd   Chief Complaint  Patient presents with   Pulmonary Consult    Referred by Dr. [name] for eval of COPD. Pt states that he has been having SOB for the past several yrs, worse for the past 3-6 months. He gets SOB with walking short distances and up and down stairs. He has CP that comes and goes. He also has some cough with white sputum.  He is using proair once daily on average.   doe x years much worse x sev months, cp is not related to breathing or exertion localized L ant chest/ lasts just a few secs Cough is worse since sob worse/ esp hs and early in am Baseline = 15 min walk outside mostly flat but struggles if gets in a hurry or steps  Thinks proair helps his cp but note resolves in 2 secs whether uses proair or not rec Continue the dexilant but for now take it Take 30- 60 min before your first and last meals of the day until cough and breathing better then just Take 30-60 min before first meal of the day  Stop Trelegy and lisinopril Losartan 50 mg only daily takes the place of lisinopril  Plan A = Automatic = symbicort 160 Take 2 puffs first thing in am and then another 2 puffs about 12 hours later.  Work on inhaler technique:   Plan B = Backup Only use your albuterol as a rescue medication  05/20/2018 acute extended  ov/[name] re: copd/ recurrent cp x one month/ did not bring meds as requested   Chief Complaint  Patient presents with   Acute Visit    Pt c/o left side CP and increased SOB x 1 month. He has had muscle aches for the past 6  wks.   Dyspnea:  Walks around park x 15 min daily never tries inhaler before walks, sob stops him and legs hurt but no cp with exertion  Cough: no Sleeping: on side bed is flat two pillows SABA use: bid avg  02: none  Cp comes and goes x years 2-3 min, always in same place / never supine/ did not follow IBS recs rec Plan A = Automatic = symbicort 160 Take 2 puffs first thing in am and then another 2 puffs about 12 hours later.  Add spiriva 2 pffs each am only  Plan B = Backup Only use your albuterol as a rescue medication  Classic   pain pattern suggests ibs rx citrucel/diet          08/09/2020  f/u ov/[name] re: GOLD 2/3  copd maint on  symbicort /spiriva  Chief Complaint  Patient presents with   Follow-up    Pt c/o unintentional wt loss and pain on his left side "for a good while".  His breathing is unchanged.    Dyspnea:  working out at gym x 15 min x ? Speed  and ex bike =resistance at 3 and ? Speed x 5-8  min and denies sob limiting  or any ex cp Cough: sporadic  Sleeping: bed is flat / months  SABA use: not using  02: none  Covid status:   vax x 3  Rec Rx as IBS Please schedule a follow up office visit in 4 weeks, sooner if needed  with all medications /inhalers/ solutions in hand so we can verify exactly what you are taking. This includes all medications from all doctors and over the counters      10/04/2021  f/u ov/[name] office/[name] re: GOLD 2  maint on symbicort/ spiriva   Chief Complaint  Patient presents with   Follow-up    Feels he has had increased SOB lately    Dyspnea:  getting over cold p cruise, neg covid / ? Gout in L foot slowing him down  Cough: improving but still congested p trip/ mucus thick yellow esp in am  Sleeping: flat bed/ 2 pillows  SABA use: none  02: never  Rec Prednisone 10 mg take  4 each am x 2 days,   2 each am x 2 days,  1 each am x 2 days and stop  Zpak (stop colchicine if on it until you finish the zpak)    04/15/2022  f/u  ov/[place] office/[name] re: GOLD 2  maint on symb/spiriva  Chief Complaint  Patient presents with   Follow-up    Pt f/u for COPD, states that his breathing is baseline    Dyspnea:  vertigo slowing him down / still doing 15 min treadmill at 2 mph and no dyspnea or cp  Cough: none  Sleeping: flat bed/ 1 pillow SABA use: none  02: none    Lung cancer screening: referred 04/15/2022    No obvious day to day or daytime variability or assoc excess/ purulent sputum or mucus plugs or hemoptysis or cp or chest tightness, subjective wheeze or overt sinus or hb symptoms.   Sleeping  without nocturnal  or early am exacerbation  of respiratory  c/o's or need for noct saba. Also denies any obvious fluctuation of symptoms with weather or environmental changes or other aggravating or alleviating factors except as outlined above   No unusual exposure hx or h/o childhood pna/ asthma or knowledge of premature birth.  Current Allergies, Complete Past Medical History, Past Surgical History, Family History, and Social History were reviewed in [facility] electronic medical record.  ROS  The following are not active complaints unless bolded Hoarseness, sore throat, dysphagia, dental problems, itching, sneezing,  nasal congestion or discharge of excess mucus or purulent secretions, ear ache,   fever, chills, sweats, unintended wt loss or wt gain, classically pleuritic or exertional cp,  orthopnea pnd or arm/hand swelling  or leg swelling, presyncope, palpitations, abdominal pain, anorexia, nausea, vomiting, diarrhea  or change in bowel habits or change in bladder habits, change in stools or change in urine, dysuria, hematuria,  rash, arthralgias, visual complaints, headache, numbness, weakness or ataxia or problems with walking or coordination,  change in mood or  memory. Vertigo        Current Meds  Medication Sig   atorvastatin (LIPITOR) 80 MG tablet TAKE 1 TABLET(80 MG) BY MOUTH DAILY   cilostazol  (PLETAL) 100 MG tablet TAKE 1 TABLET(100 MG) BY MOUTH TWICE DAILY   clopidogrel (PLAVIX) 75 MG tablet Take 1 tablet (75 mg total) by mouth daily.   dexlansoprazole (DEXILANT) 60 MG capsule Take 1 capsule (60 mg total) by mouth daily.   diclofenac Sodium (VOLTAREN)  1 % GEL Apply 2 gram  to painful foot and ankle joint twice daily.   glipiZIDE (GLUCOTROL) 5 MG tablet Take 5 mg by mouth daily.   HYDROcodone-acetaminophen (NORCO) 7.5-325 MG per tablet Take 1-2 tablets by mouth 2 (two) times daily as needed for moderate pain.   JANUVIA 100 MG tablet Take 100 mg by mouth daily.   JARDIANCE 25 MG TABS tablet Take 25 mg by mouth daily.   metoprolol succinate (TOPROL XL) 25 MG 24 hr tablet Take 1 tablet (25 mg total) by mouth daily.   nitroGLYCERIN (NITROSTAT) 0.4 MG SL tablet Place 1 tablet (0.4 mg total) under the tongue every 5 (five) minutes x 3 doses as needed for chest pain (If no relief after 3rd dose, GO TO ER.).   Omega-3 Fatty Acids (OMEGA-3 FISH OIL PO) Take 1 capsule by mouth daily.   ONE TOUCH ULTRA TEST test strip    OneTouch Delica Lancets 33G MISC Apply topically.   PROAIR HFA 108 (90 BASE) MCG/ACT inhaler Inhale 2 puffs into the lungs every 6 (six) hours as needed for wheezing or shortness of breath.    sacubitril-valsartan (ENTRESTO) 24-26 MG Take 1 tablet by mouth 2 (two) times daily.   sildenafil (REVATIO) 20 MG tablet Take 40-60 mg by mouth daily as needed for erectile dysfunction.   SYMBICORT 160-4.5 MCG/ACT inhaler INHALE 2 PUFFS FIRST THING IN THE MORNING AND THEN ANOTHER 2 PUFFS 12 HOURS LATER   Tiotropium Bromide Monohydrate (SPIRIVA RESPIMAT) 2.5 MCG/ACT AERS INHALE 2 PUFFS INTO THE LUNGS DAILY   XELJANZ 5 MG TABS TAKE 1 TABLET(5 MG) BY MOUTH DAILY                         Objective:   Physical Exam   Wts  04/15/2022           169  10/04/2021        167   09/28/2020      166   08/09/2020         158 02/09/2019         176  08/05/2018       172 05/20/2018        176 11/04/2017     180  04/04/2017       179  03/11/2017         180   02/13/2017        175   01/28/17 175 lb 12.8 oz (79.7 kg)  01/16/17 175 lb (79.4 kg)  11/01/16 170 lb (77.1 kg)    Vital signs reviewed  04/15/2022  - Note at rest 02 sats  93% on RA   General appearance:    amb pleasant bm nad     HEENT : Oropharynx  clear/ remaining teeth ok      NECK :  without  apparent JVD/ palpable Nodes/TM    LUNGS: no acc muscle use,  Min barrel  contour chest wall with bilateral  slightly decreased bs s audible wheeze and  without cough on insp or exp maneuvers and min  Hyperresonant  to  percussion bilaterally    CV:  RRR  no s3 or murmur or increase in P2, and no edema   ABD:  soft and nontender   MS:  Nl gait/ ext warm without deformities Or obvious joint restrictions  calf tenderness, cyanosis or clubbing     SKIN: warm and dry without lesions    NEURO:  alert,  approp, nl sensorium with  no motor or cerebellar deficits apparent.            Assessment:

## 2022-04-15 ENCOUNTER — Encounter: Payer: Self-pay | Admitting: Internal Medicine

## 2022-04-15 ENCOUNTER — Ambulatory Visit (INDEPENDENT_AMBULATORY_CARE_PROVIDER_SITE_OTHER): Payer: 59 | Admitting: Internal Medicine

## 2022-04-15 VITALS — BP 114/71 | HR 92 | Ht 69.0 in | Wt 169.0 lb

## 2022-04-15 DIAGNOSIS — J449 Chronic obstructive pulmonary disease, unspecified: Secondary | ICD-10-CM | POA: Diagnosis not present

## 2022-04-15 DIAGNOSIS — Z87891 Personal history of nicotine dependence: Secondary | ICD-10-CM | POA: Diagnosis not present

## 2022-04-15 NOTE — Assessment & Plan Note (Signed)
Quit smoking 2013   Low-dose CT lung cancer screening is recommended for patients who are 36-72 years of age with a 20+ pack-year history of smoking and who are currently smoking or quit <=15 years ago. No coughing up blood  No unintentional weight loss of > 15 pounds in the last 6 months - pt is eligible for scanning yearly until 2028 by present guidelines > referred for shared decision making         Each maintenance medication was reviewed in detail including emphasizing most importantly the difference between maintenance and prns and under what circumstances the prns are to be triggered using an action plan format where appropriate.  Total time for H and P, chart review, counseling, reviewing hfa/smi device(s) and generating customized AVS unique to this office visit / same day charting = 23 min

## 2022-04-15 NOTE — Assessment & Plan Note (Signed)
Quit smoking 2013 PFT's 01/04/15  FEV1 2.15 (71 % ) ratio 58  p 14 % improvement from saba p ? prior to study with DLCO  70 % corrects to 94  % for alv volume  With only mild curvature f/v loop  - Spirometry 01/28/2017  FEV1 1.59 (53%)  Ratio 59 off all rx  - 01/28/2017    symb 160 2bid and d/c acei and double the dexilant > marked improvement 02/13/2017  PFT's  04/04/2017  FEV1 1.99 (68 % ) ratio 62  p 23 % improvement from saba p nothing prior to study with DLCO  63 % corrects to 82  % for alv volume   - 04/04/2017  After extensive coaching inhaler device  effectiveness =    90%  - 11/04/2017  Walked RA x 3 laps @ 185 ft each stopped due to  End of study, fast pace, no  Desat, mild sob  - 11/04/2017  After extensive coaching inhaler device,  effectiveness =    75% > restart symb 160 2bid - Spirometry 11/04/2017  FEV1 1.4 (47%)  Ratio 58 off all rx     - 05/20/2018  After extensive coaching inhaler device,  effectiveness = 75% with symb and 90% with SMI > try add spiriva 2 pffs each am   - 05/20/2018   Walked RA  2 laps @  approx 291ft each @ fast pace  stopped due to  End of study, no cp or sob or desats    -  08/09/2020   Walked RA  3 laps @ approx 23ft each @ fast pace  stopped due to end of study, min  sob, sats 96% at end   -09/28/2020  After extensive coaching inhaler device,  effectiveness =    75% (short Ti) for both smi and hfa    Group D (now reclassified as E) in terms of symptom/risk and laba/lama/ICS  therefore appropriate rx at this point >>>  symb/spiriva and on approp saba at this point   F/u can be yearly

## 2022-04-15 NOTE — Patient Instructions (Addendum)
To get the most out of exercise, you need to be continuously aware that you are short of breath, but never out of breath, for at least 15 minutes daily. As you improve, it will actually be easier for you to do the same amount of exercise      My office will be contacting you by phone for referral to lung cancer screening  - if you don't hear back from my office within one week please call us back or notify us thru MyChart and we'll address it right away.   Please schedule a follow up visit in 12 months but call sooner if needed

## 2022-04-16 ENCOUNTER — Encounter: Payer: Self-pay | Admitting: Nurse Practitioner

## 2022-04-16 ENCOUNTER — Ambulatory Visit: Payer: 59 | Attending: Nurse Practitioner | Admitting: Nurse Practitioner

## 2022-04-16 VITALS — BP 110/70 | HR 74 | Ht 69.0 in | Wt 166.4 lb

## 2022-04-16 DIAGNOSIS — I5022 Chronic systolic (congestive) heart failure: Secondary | ICD-10-CM

## 2022-04-16 DIAGNOSIS — R002 Palpitations: Secondary | ICD-10-CM | POA: Diagnosis not present

## 2022-04-16 DIAGNOSIS — I10 Essential (primary) hypertension: Secondary | ICD-10-CM | POA: Diagnosis not present

## 2022-04-16 DIAGNOSIS — R0789 Other chest pain: Secondary | ICD-10-CM

## 2022-04-16 DIAGNOSIS — E785 Hyperlipidemia, unspecified: Secondary | ICD-10-CM

## 2022-04-16 DIAGNOSIS — I471 Supraventricular tachycardia, unspecified: Secondary | ICD-10-CM

## 2022-04-16 DIAGNOSIS — I77810 Thoracic aortic ectasia: Secondary | ICD-10-CM | POA: Diagnosis not present

## 2022-04-16 DIAGNOSIS — I739 Peripheral vascular disease, unspecified: Secondary | ICD-10-CM | POA: Diagnosis not present

## 2022-04-16 NOTE — Progress Notes (Addendum)
Cardiology Office Note:    Date:  04/16/2022   ID:  [name], DOB 11-18-1950, MRN [id number]  PCP:  Ignatius Specking, MD   [facility] Providers Cardiologist:  Nona Dell, MD     Referring MD: Ignatius Specking, MD   CC: Here for follow-up  History of Present Illness:    [name] is a very pleasant 72 y.o. male with a hx of the following:   HFmrEF Palpitations, PSVT HLD PAD Carotid artery disease COPD T2DM RA HLD HTN GERD Hx of DVT  Previous cardiovascular history of nonobstructive CAD by cardiac catheterization as seen in 2007, 2013, and 2021.  He is closely followed by [facility] for history of PAD, status post left fem-pop bypass in 2007, with PTCA and stenting of right iliac artery in 2013.  Recent cardiac monitor demonstrated brief episodes of PSVT.  Was started on beta-blocker.  Echocardiogram revealed mildly reduced left ventricular systolic function at 45 to 50%, mild apical septal hypokinesis.  Last seen by Dr. Diona Browner on January 17, 2022.  Was doing well at that time.  Claudication was stable, symptoms noted more on right than left.  Lexiscan Myoview was arranged and revealed findings consistent with inferior wall infarct scar and moderate peri-infarct ischemia, EF measured at 53%.  Patient did not take beta-blocker as instructed from previous office visit, Dr. Diona Browner started Toprol-XL 12.5 mg daily.   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   At last visit, he had started taking metoprolol and heart palpitations have stopped. Admitted to atypical CP.  Refill nitroglycerin and increased metoprolol succinate.  Had ED visit at end of March for vertigo.  Has resolved.  Today he presents for follow-up.  He is doing well, however he admits to atypical chest pain, unsure if it is similar to previous office visit.  He has a difficult time describing his chest pain,  stated he "does not have the best memory."  Intermittent and brief, noticed at nighttime, has not happened often.  Described as "jumping/tapping," left-sided, stable over time.  Wonders if it is gas or pulled muscle.  Stable, chronic shortness of breath related to COPD.  Recently saw pulmonologist. Denies palpitations, syncope, presyncope, dizziness, orthopnea, PND, swelling or significant weight changes, acute bleeding, or claudication.  Says his rheumatologist recently started [facility] for RA, wants to know if this is okay with his heart.  SH: Very active and exercises every day.   Past Medical History:  Diagnosis Date   Anemia    Bronchitis    Carotid artery disease    Cervical spine disease    Neck surgery in 2007 with Dr. Jeral Fruit   Collagen vascular disease    COPD (chronic obstructive pulmonary disease)    Coronary atherosclerosis of native coronary artery    Nonobstrtuctive by catheterization 2007, 2013, 2021   DVT (deep venous thrombosis)    Essential hypertension    GERD (gastroesophageal reflux disease)    Hyperlipidemia    PAD (peripheral artery disease)    s/p left fem-pop bypass 2007; PTA and stenting of right iliac artery 09/2011   Rheumatoid arthritis(714.0)    Type 2 diabetes mellitus     Past Surgical History:  Procedure Laterality Date   ABDOMINAL AORTAGRAM N/A 10/03/2011   Procedure: ABDOMINAL Ronny Flurry;  Surgeon: Fransisco Hertz, MD;  Location: [facility] CATH LAB;  Service: Cardiovascular;  Laterality: N/A;   ABDOMINAL AORTOGRAM W/LOWER EXTREMITY N/A 01/16/2017  Procedure: ABDOMINAL AORTOGRAM W/LOWER EXTREMITY;  Surgeon: Fransisco Hertz, MD;  Location: [facility] INVASIVE CV LAB;  Service: Cardiovascular;  Laterality: N/A;   ABDOMINAL AORTOGRAM W/LOWER EXTREMITY Bilateral 08/04/2019   Procedure: ABDOMINAL AORTOGRAM W/LOWER EXTREMITY;  Surgeon: Cephus Shelling, MD;  Location: [facility] INVASIVE CV LAB;  Service: Cardiovascular;  Laterality: Bilateral;   ABDOMINAL AORTOGRAM W/LOWER  EXTREMITY N/A 03/09/2020   Procedure: ABDOMINAL AORTOGRAM W/LOWER EXTREMITY;  Surgeon: Cephus Shelling, MD;  Location: [facility] INVASIVE CV LAB;  Service: Cardiovascular;  Laterality: N/A;   BIOPSY N/A 12/28/2021   Procedure: BIOPSY;  Surgeon: Lanelle Bal, DO;  Location: [facility] ENDO SUITE;  Service: Endoscopy;  Laterality: N/A;   CERVICAL DISC SURGERY     COLONOSCOPY  2013   Hyperplastic polyp   COLONOSCOPY WITH PROPOFOL N/A 02/17/2017   Surgeon: Corbin Ade, MD; Many small and large mouth diverticula in the sigmoid, descending, transverse colon, internal hemorrhoids that were medium sized and grade 1.   COLONOSCOPY WITH PROPOFOL N/A 12/28/2021   Procedure: COLONOSCOPY WITH PROPOFOL;  Surgeon: Lanelle Bal, DO;  Location: [facility] ENDO SUITE;  Service: Endoscopy;  Laterality: N/A;  10:45 am   ESOPHAGOGASTRODUODENOSCOPY (EGD) WITH PROPOFOL N/A 12/28/2021   Procedure: ESOPHAGOGASTRODUODENOSCOPY (EGD) WITH PROPOFOL;  Surgeon: Lanelle Bal, DO;  Location: [facility] ENDO SUITE;  Service: Endoscopy;  Laterality: N/A;   FEMORAL-POPLITEAL BYPASS GRAFT  11/06/2005   ILIAC ARTERY STENT  10/03/2011   Right CIA stenting   LEFT HEART CATH AND CORONARY ANGIOGRAPHY N/A 04/14/2019   Procedure: LEFT HEART CATH AND CORONARY ANGIOGRAPHY;  Surgeon: Lennette Bihari, MD;  Location: [facility] INVASIVE CV LAB;  Service: Cardiovascular;  Laterality: N/A;   PERIPHERAL VASCULAR BALLOON ANGIOPLASTY Right 01/16/2017   Procedure: PERIPHERAL VASCULAR BALLOON ANGIOPLASTY;  Surgeon: Fransisco Hertz, MD;  Location: [facility] INVASIVE CV LAB;  Service: Cardiovascular;  Laterality: Right;  common iliac    PERIPHERAL VASCULAR BALLOON ANGIOPLASTY  03/09/2020   Procedure: PERIPHERAL VASCULAR BALLOON ANGIOPLASTY;  Surgeon: Cephus Shelling, MD;  Location: [facility] INVASIVE CV LAB;  Service: Cardiovascular;;   PR VEIN BYPASS GRAFT,AORTO-FEM-POP  06/22/2010   Redo Left Fem-Pop   Repair of left arm fracture     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   dexlansoprazole (DEXILANT) 60 MG capsule Take 1 capsule (60 mg total) by mouth daily.   diclofenac Sodium (VOLTAREN) 1 % GEL Apply 2 gram  to painful foot and ankle joint twice daily.   glipiZIDE (GLUCOTROL) 5 MG tablet Take 5 mg by mouth daily.   HYDROcodone-acetaminophen (NORCO) 7.5-325 MG per tablet Take 1-2 tablets by mouth 2 (two) times daily as needed for moderate pain.   JANUVIA 100 MG tablet Take 100 mg by mouth daily.   JARDIANCE 25 MG TABS tablet Take 25 mg by mouth daily.   metoprolol succinate (TOPROL XL) 25 MG 24 hr tablet Take 1 tablet (25 mg total) by mouth daily.   nitroGLYCERIN (NITROSTAT) 0.4 MG SL tablet Place 1 tablet (0.4 mg total) under the tongue every 5 (five) minutes x 3 doses as needed for chest pain (If no relief after 3rd dose, GO TO  ER.).   Omega-3 Fatty Acids (OMEGA-3 FISH OIL PO) Take 1 capsule by mouth daily.   ONE TOUCH ULTRA TEST test strip    OneTouch Delica Lancets 33G MISC Apply topically.   PROAIR HFA 108 (90 BASE) MCG/ACT inhaler Inhale 2 puffs into the lungs every 6 (six) hours as needed for wheezing or shortness of breath.    sacubitril-valsartan (ENTRESTO) 24-26 MG Take 1 tablet by mouth 2 (two) times daily.   sildenafil (REVATIO) 20 MG tablet Take 40-60 mg by mouth daily as needed for erectile dysfunction.   SYMBICORT 160-4.5 MCG/ACT inhaler INHALE 2 PUFFS FIRST THING IN THE MORNING AND THEN ANOTHER 2 PUFFS 12 HOURS LATER   Tiotropium Bromide Monohydrate (SPIRIVA RESPIMAT) 2.5 MCG/ACT AERS INHALE 2 PUFFS INTO THE LUNGS DAILY   XELJANZ 5 MG TABS TAKE 1 TABLET(5 MG) BY MOUTH DAILY    Allergies:   Patient has no known allergies.   Social History    Socioeconomic History   Marital status: Married    Spouse name: Not on file   Number of children: Not on file   Years of education: Not on file   Highest education level: Not on file  Occupational History   Not on file  Tobacco Use   Smoking status: Former    Packs/day: 1.00    Years: 45.00    Additional pack years: 0.00    Total pack years: 45.00    Types: Cigarettes, Cigars    Quit date: 09/01/2011    Years since quitting: 10.6    Passive exposure: Never   Smokeless tobacco: Never  Vaping Use   Vaping Use: Never used  Substance and Sexual Activity   Alcohol use: Not Currently   Drug use: No   Sexual activity: Not Currently    Birth control/protection: None  Other Topics Concern   Not on file  Social History Narrative   The patient lives in [place].    Social Determinants of Health   Financial Resource Strain: Not on file  Food Insecurity: Not on file  Transportation Needs: Not on file  Physical Activity: Not on file  Stress: Not on file  Social Connections: Not on file     Family History: The patient's family history includes Cancer in his brother and son; Deep vein thrombosis in his brother and mother; Diabetes in his brother, daughter, father, mother, and sister; Heart attack (age of onset: 3682) in his mother; Hyperlipidemia in his brother, father, mother, sister, and son; Hypertension in his brother, father, mother, sister, and son; Peripheral vascular disease in his brother, father, and mother; Stroke in his father. There is no history of Colon cancer.  ROS:   Please see the history of present illness.     All other systems reviewed and are negative.  EKGs/Labs/Other Studies Reviewed:    The following studies were reviewed today:   EKG:  EKG is not ordered today.    Carotid Doppler 03/2022: Summary:  Right Carotid: Velocities in the right ICA are consistent with a 1-39%  stenosis.   Left Carotid: Velocities in the left ICA are consistent with a 1-39%   stenosis.   Vertebrals: Bilateral vertebral arteries demonstrate antegrade flow.  Subclavians: Normal flow hemodynamics were seen in bilateral subclavian               arteries.   Myoview on 01/24/2022:    Findings are consistent with prior inferior infarction with mild peri-infarct ischemia. The study is low risk.   No  ST deviation was noted.   LV perfusion is abnormal. Moderate size moderate intensity inferior defect with mild reversibility.   Left ventricular function is normal. Nuclear stress EF: 53 %. The left ventricular ejection fraction is normal (55-65%). End diastolic cavity size is normal.      Echocardiogram on 01/10/2022:  1. Left ventricular ejection fraction, by estimation, is 45 to 50%. The  left ventricle has mildly decreased function. The left ventricle  demonstrates regional wall motion abnormalities (see scoring  diagram/findings for description). There is mild  concentric left ventricular hypertrophy. Left ventricular diastolic  parameters are consistent with Grade I diastolic dysfunction (impaired  relaxation).   2. Right ventricular systolic function is normal. The right ventricular  size is normal.   3. Left atrial size was upper normal.   4. The mitral valve is grossly normal. Trivial mitral valve  regurgitation.   5. The aortic valve is tricuspid. There is mild calcification of the  aortic valve. Aortic valve regurgitation is mild. Aortic regurgitation PHT  measures 531 msec. Aortic valve mean gradient measures 8.0 mmHg.   6. Aortic dilatation noted. There is borderline dilatation of the aortic  root, measuring 39 mm.   7. The inferior vena cava is normal in size with greater than 50%  respiratory variability, suggesting right atrial pressure of 3 mmHg.   Comparison(s): Prior images unable to be directly viewed. LVEF reported at  50% at angiography in 2021.  Monitor on 01/01/2022:  Patient had a min HR of 59 bpm (sinus bradycardia), max HR of 203 bpm  (1.9 second duration of brief supraventricular tachycardia), and avg HR of 101 bpm (sinus tachycardia). Predominant underlying rhythm was Sinus Rhythm. 6 Supraventricular Tachycardia runs occurred, the run with the fastest interval lasting 5 beats (1.9 second duration) with a max rate of 203 bpm, the longest lasting 19 beats (6.8 seconds) with an avg rate of 163 bpm. Isolated SVEs were occasional (1.4%, 13864), SVE Couplets were rare (<1.0%, 88), and SVE Triplets were rare (<1.0%, 5). Isolated VEs were rare (<1.0%, 3894), VE Couplets were rare (<1.0%, 22), and VE Triplets were rare (<1.0%, 1). Ventricular Bigeminy was present.    Impression: Brief supraventricular tachycardia detected (6 episodes in 7 days; longest duration 6.8 seconds).  Occasional PACs (1.4% burden).   Cardiac cath on 04/14/2019:  Prox LAD to Mid LAD lesion is 20% stenosed. Mid LAD lesion is 30% stenosed. Prox RCA lesion is 30% stenosed.   Mild non-obstructive CAD with 20 and 30% irregularities in the proximal to mid LAD; normal ramus intermediate and left circumflex coronary arteries;  and RCA with smooth 30% proximal narrowing with distal vessel tortuosity.   Low normal global LV function with EF estimated at 50%; LVEDP 14 mmHg.   RECOMMENDATION: Medical therapy for CAD.  Will increase lipid-lowering therapy to atorvastatin 40 mg.  Maintain optimal blood pressure control with target less than 130/80 and ideal blood pressure less than 120/80.  Target LDL less than 70.  Recent Labs: 09/11/2021: ALT 12 03/19/2022: BUN 15; Creatinine, Ser 0.91; Hemoglobin 13.7; Magnesium 2.0; Platelets 316; Potassium 4.1; Sodium 138  Recent Lipid Panel    Component Value Date/Time   CHOL 146 09/11/2021 0937   CHOL 160 11/09/2019 1032   TRIG 56 09/11/2021 0937   HDL 57 09/11/2021 0937   HDL 61 11/09/2019 1032   CHOLHDL 2.6 09/11/2021 0937   VLDL 17 01/25/2021 1118   LDLCALC 75 09/11/2021 0937     Risk Assessment/Calculations:  The  10-year ASCVD risk score (Arnett DK, et al., 2019) is: 23.8%   Values used to calculate the score:     Age: 69 years     Sex: Male     Is Non-Hispanic African American: Yes     Diabetic: Yes     Tobacco smoker: No     Systolic Blood Pressure: 110 mmHg     Is BP treated: Yes     HDL Cholesterol: 57 mg/dL     Total Cholesterol: 146 mg/dL  Physical Exam:    VS:  BP 110/70 (BP Location: Left Arm, Patient Position: Sitting, Cuff Size: Normal)   Pulse 74   Ht 5\' 9"  (1.753 m)   Wt 166 lb 6.4 oz (75.5 kg)   SpO2 95%   BMI 24.57 kg/m     Wt Readings from Last 3 Encounters:  04/16/22 166 lb 6.4 oz (75.5 kg)  04/15/22 169 lb (76.7 kg)  04/11/22 166 lb (75.3 kg)     GEN:  Well nourished, well developed in no acute distress HEENT: Normal NECK: No JVD; Right carotid bruit, no left carotid bruit LYMPHATICS: No lymphadenopathy CARDIAC: S1/S2, RRR, no murmurs, rubs, gallops; 2+ pulses RESPIRATORY:  Clear to auscultation without rales, wheezing or rhonchi  MUSCULOSKELETAL:  No edema; No deformity  SKIN: Warm and dry NEUROLOGIC:  Alert and oriented x 3 PSYCHIATRIC:  Normal affect   ASSESSMENT:    1. Atypical chest pain   2. Heart failure with mildly reduced ejection fraction (HFmrEF)   3. Palpitations   4. PSVT (paroxysmal supraventricular tachycardia)   5. Hyperlipidemia, unspecified hyperlipidemia type   6. PAD (peripheral artery disease)   7. Essential hypertension, benign   8. Aortic root dilatation     PLAN:    In order of problems listed above:  Atypical chest pain Etiology very atypical and most likely MSK. Denies any exertional symptoms. NST 01/2022 showed inferior wall infarct scare and moderate per-infarct ischemia. Continue current medication regimen at this time. Recommended Tylenol 1,000 mg BID. No medication changes.  Recommended to journal his symptoms over the next several weeks.  Heart healthy diet and regular cardiovascular exercise encouraged. ED precautions  discussed. If no improvement in symptoms by next follow-up, consider CCTA.    HFmrEF TTE 01/2022 showed EF mildly reduced at 45-50%, RWMA, mild LVH, grade 1 DD. Euvolemic and well compensated on exam. Continue GDMT -BP does not allow room to uptitrate GDMT at this time. Low sodium diet, fluid restriction <2L, and daily weights encouraged. Educated to contact our office for weight gain of 2 lbs overnight or 5 lbs in one week. Heart healthy diet and regular cardiovascular exercise encouraged. Taking [name] - will route note to Dr. Diona Browner to determine if medication should be continued/stopped.  Addendum: Dr. Diona Browner stated, "I think this is a situation where he should discuss the risk/benefit ratio with the prescriber, presumably rheumatology.  Yes, there is a higher mortality risk and risk of major adverse cardiac events in patients over 50 with at least 1 cardiac risk factor.  He has known albeit mild CAD and mildly reduced LVEF, also PAD.  It is listed as a black box warning and I would presume that the prescriber reviewed this with him."  Sherron Ales, PA-C with rheumatology did counsel patient about FDA blackbox warning of MACE.  Patient voiced understanding per review of chart.  Palpitations, PSVT Denies any palpitations or tachycardia. Continue medication regimen. Heart healthy diet and regular cardiovascular exercise encouraged.  HLD, PAD Last LDL 75. He is closely followed by [facility] for history of PAD, status post left fem-pop bypass in 2007, with PTCA and stenting of right iliac artery in 2013.  Carotid Doppler study as mentioned above.  Continue Pletal, Plavix, and Atorvastatin. Heart healthy diet and regular cardiovascular exercise encouraged.   HTN BP stable. Continue rest of current medication regimen. Discussed to monitor BP at home at least 2 hours after medications and sitting for 5-10 minutes. Heart healthy diet and regular cardiovascular exercise encouraged.   Aortic root  dilatation Borderline dilatation of aortic root noted on prevoius Echo, measuring 39 mm. Plan to update study in 1 year or sooner if needed.   Disposition: Follow-up with me or APP in 4-6 weeks or sooner if anything changes.       Medication Adjustments/Labs and Tests Ordered: Current medicines are reviewed at length with the patient today.  Concerns regarding medicines are outlined above.  No orders of the defined types were placed in this encounter.  No orders of the defined types were placed in this encounter.   Patient Instructions  Medication Instructions:  Your physician recommends that you continue on your current medications as directed. Please refer to the Current Medication list given to you today.   Labwork: none  Testing/Procedures: none  Follow-Up:  Your physician recommends that you schedule a follow-up appointment in: 4-6 weeks  Any Other Special Instructions Will Be Listed Below (If Applicable).  Please keep a log of your symptoms and bring with you to your next office visit.  If you need a refill on your cardiac medications before your next appointment, please call your pharmacy.    SignedSharlene Dory, NP  04/16/2022 9:15 AM    [facility]

## 2022-04-16 NOTE — Patient Instructions (Signed)
Medication Instructions:  Your physician recommends that you continue on your current medications as directed. Please refer to the Current Medication list given to you today.   Labwork: none  Testing/Procedures: none  Follow-Up:  Your physician recommends that you schedule a follow-up appointment in: 4-6 weeks  Any Other Special Instructions Will Be Listed Below (If Applicable).  Please keep a log of your symptoms and bring with you to your next office visit.  If you need a refill on your cardiac medications before your next appointment, please call your pharmacy.

## 2022-04-26 ENCOUNTER — Other Ambulatory Visit: Payer: Self-pay | Admitting: Nurse Practitioner

## 2022-05-03 ENCOUNTER — Ambulatory Visit (INDEPENDENT_AMBULATORY_CARE_PROVIDER_SITE_OTHER): Payer: 59 | Admitting: Podiatry

## 2022-05-03 DIAGNOSIS — E1159 Type 2 diabetes mellitus with other circulatory complications: Secondary | ICD-10-CM | POA: Diagnosis not present

## 2022-05-03 DIAGNOSIS — M79674 Pain in right toe(s): Secondary | ICD-10-CM

## 2022-05-03 DIAGNOSIS — B351 Tinea unguium: Secondary | ICD-10-CM | POA: Diagnosis not present

## 2022-05-03 DIAGNOSIS — M79675 Pain in left toe(s): Secondary | ICD-10-CM

## 2022-05-05 ENCOUNTER — Encounter: Payer: Self-pay | Admitting: Podiatry

## 2022-05-05 NOTE — Progress Notes (Signed)
  Subjective:  Patient ID: [name], male    DOB: 1950/06/08,  MRN: [id number]  [name] presents to clinic today for at risk foot care. Pt has h/o NIDDM with PAD  Chief Complaint  Patient presents with   Diabetes    DFC BS - 198 A1C - 8.1 LVPCP - 2 WKS AGO   New problem(s): None.   PCP is Vyas, Herminio Commons B, MD.  No Known Allergies  Review of Systems: Negative except as noted in the HPI.  Objective: No changes noted in today's physical examination. There were no vitals filed for this visit.  [name] is a pleasant 72 y.o. male WD, WN in NAD. AAO x 3. Neurovascular Examination: Unchanged on today's visit. CFT <4 seconds b/l. DP pulses diminished b/l. PT pulses diminished b/l. Digital hair absent. Skin temperature gradient warm to cool b/l. No ischemia or gangrene. No cyanosis or clubbing noted b/l. No pain with calf compression b/l. Sensation grossly intact b/l with 10 gram monofilament. Vibratory sensation intact b/l. Pt has subjective symptoms of neuropathy.  Dermatological Examination: Pedal skin thin, shiny and atrophic b/l. No open wounds. No interdigital macerations.   Toenails 1-5 b/l thick, discolored, elongated with subungual debris and pain on dorsal palpation.   No hyperkeratotic nor porokeratotic lesions present on today's visit.  Musculoskeletal Examination: Muscle strength 5/5 to b/l LE. No pain, crepitus or joint limitation noted with ROM bilateral LE. HAV with bunion deformity noted b/l LE. Hammertoe deformity noted 2-5 b/l. Patient ambulates independent of any assistive aids.  Radiographs: None  Assessment/Plan: 1. Pain due to onychomycosis of toenails of both feet   2. Type 2 diabetes mellitus with vascular disease (HCC)     No orders of the defined types were placed in this encounter.  -Consent given for treatment as described below: -Examined patient. -Continue close Vascular surveillance. He is well educated on signs/symptoms which would  require emergent visit to Vascular Team. -Patient to continue soft, supportive shoe gear daily. -Toenails were debrided in length and girth 1-5 bilaterally with sterile nail nippers and dremel without iatrogenic bleeding.  -Patient/POA to call should there be question/concern in the interim.   Return in about 9 weeks (around 07/05/2022).  Freddie Breech, DPM

## 2022-05-07 ENCOUNTER — Other Ambulatory Visit: Payer: Self-pay

## 2022-05-07 DIAGNOSIS — Z299 Encounter for prophylactic measures, unspecified: Secondary | ICD-10-CM | POA: Diagnosis not present

## 2022-05-07 DIAGNOSIS — Z79899 Other long term (current) drug therapy: Secondary | ICD-10-CM | POA: Diagnosis not present

## 2022-05-07 DIAGNOSIS — Z87891 Personal history of nicotine dependence: Secondary | ICD-10-CM

## 2022-05-07 DIAGNOSIS — I25119 Atherosclerotic heart disease of native coronary artery with unspecified angina pectoris: Secondary | ICD-10-CM | POA: Diagnosis not present

## 2022-05-07 DIAGNOSIS — I700 Atherosclerosis of aorta: Secondary | ICD-10-CM | POA: Diagnosis not present

## 2022-05-07 DIAGNOSIS — E1165 Type 2 diabetes mellitus with hyperglycemia: Secondary | ICD-10-CM | POA: Diagnosis not present

## 2022-05-07 DIAGNOSIS — R079 Chest pain, unspecified: Secondary | ICD-10-CM | POA: Diagnosis not present

## 2022-05-07 DIAGNOSIS — I10 Essential (primary) hypertension: Secondary | ICD-10-CM | POA: Diagnosis not present

## 2022-05-14 ENCOUNTER — Ambulatory Visit (INDEPENDENT_AMBULATORY_CARE_PROVIDER_SITE_OTHER)
Admission: RE | Admit: 2022-05-14 | Discharge: 2022-05-14 | Disposition: A | Discharge status: Home / Self Care | Payer: 59 | Source: Ambulatory Visit | Attending: Vascular Surgery | Admitting: Vascular Surgery

## 2022-05-14 ENCOUNTER — Ambulatory Visit (INDEPENDENT_AMBULATORY_CARE_PROVIDER_SITE_OTHER): Payer: 59 | Admitting: Physician Assistant

## 2022-05-14 ENCOUNTER — Ambulatory Visit (HOSPITAL_COMMUNITY)
Admission: RE | Admit: 2022-05-14 | Discharge: 2022-05-14 | Disposition: A | Discharge status: Home / Self Care | Payer: 59 | Source: Ambulatory Visit | Attending: Vascular Surgery | Admitting: Vascular Surgery

## 2022-05-14 VITALS — BP 105/73 | HR 68 | Temp 97.8°F | Wt 162.0 lb

## 2022-05-14 DIAGNOSIS — I739 Peripheral vascular disease, unspecified: Secondary | ICD-10-CM | POA: Diagnosis not present

## 2022-05-14 DIAGNOSIS — Z95828 Presence of other vascular implants and grafts: Secondary | ICD-10-CM

## 2022-05-14 DIAGNOSIS — I779 Disorder of arteries and arterioles, unspecified: Secondary | ICD-10-CM | POA: Diagnosis not present

## 2022-05-14 DIAGNOSIS — I70213 Atherosclerosis of native arteries of extremities with intermittent claudication, bilateral legs: Secondary | ICD-10-CM | POA: Diagnosis not present

## 2022-05-14 LAB — VAS US ABI WITH/WO TBI: Left ABI: 1.25

## 2022-05-14 NOTE — Progress Notes (Signed)
Office Note     CC:  follow up Requesting Provider:  Ignatius Specking, MD  HPI: [name] is a 72 y.o. (24-Mar-1950) male who presents for surveillance of PAD.  He has history of left common femoral to above-the-knee popliteal bypass with PTFE in 2007.  He also has right common iliac artery stent placed in 2013.  Most recently he underwent mid SFA and proximal anterior tibial angioplasty by Dr. Chestine Spore on 03/09/2020.  This was performed due to a right third toe ulcer which is healed.  He denies any claudication, rest pain, or new tissue loss.  He is on aspirin, Plavix, statin daily.  He believes chronic medical conditions including type 2 diabetes, hypertension, and hyperlipidemia are well controlled.  He denies tobacco use.   Past Medical History:  Diagnosis Date   Anemia    Bronchitis    Carotid artery disease (HCC)    Cervical spine disease    Neck surgery in 2007 with Dr. Jeral Fruit   Collagen vascular disease [facility])    COPD (chronic obstructive pulmonary disease) (HCC)    Coronary atherosclerosis of native coronary artery    Nonobstrtuctive by catheterization 2007, 2013, 2021   DVT (deep venous thrombosis) (HCC)    Essential hypertension    GERD (gastroesophageal reflux disease)    Hyperlipidemia    PAD (peripheral artery disease) (HCC)    s/p left fem-pop bypass 2007; PTA and stenting of right iliac artery 09/2011   Rheumatoid arthritis(714.0)    Type 2 diabetes mellitus (HCC)     Past Surgical History:  Procedure Laterality Date   ABDOMINAL AORTAGRAM N/A 10/03/2011   Procedure: ABDOMINAL Ronny Flurry;  Surgeon: Fransisco Hertz, MD;  Location: [facility] CATH LAB;  Service: Cardiovascular;  Laterality: N/A;   ABDOMINAL AORTOGRAM W/LOWER EXTREMITY N/A 01/16/2017   Procedure: ABDOMINAL AORTOGRAM W/LOWER EXTREMITY;  Surgeon: Fransisco Hertz, MD;  Location: [facility] INVASIVE CV LAB;  Service: Cardiovascular;  Laterality: N/A;   ABDOMINAL AORTOGRAM W/LOWER EXTREMITY Bilateral 08/04/2019   Procedure:  ABDOMINAL AORTOGRAM W/LOWER EXTREMITY;  Surgeon: Cephus Shelling, MD;  Location: [facility] INVASIVE CV LAB;  Service: Cardiovascular;  Laterality: Bilateral;   ABDOMINAL AORTOGRAM W/LOWER EXTREMITY N/A 03/09/2020   Procedure: ABDOMINAL AORTOGRAM W/LOWER EXTREMITY;  Surgeon: Cephus Shelling, MD;  Location: [facility] INVASIVE CV LAB;  Service: Cardiovascular;  Laterality: N/A;   BIOPSY N/A 12/28/2021   Procedure: BIOPSY;  Surgeon: Lanelle Bal, DO;  Location: [facility] ENDO SUITE;  Service: Endoscopy;  Laterality: N/A;   CERVICAL DISC SURGERY     COLONOSCOPY  2013   Hyperplastic polyp   COLONOSCOPY WITH PROPOFOL N/A 02/17/2017   Surgeon: Corbin Ade, MD; Many small and large mouth diverticula in the sigmoid, descending, transverse colon, internal hemorrhoids that were medium sized and grade 1.   COLONOSCOPY WITH PROPOFOL N/A 12/28/2021   Procedure: COLONOSCOPY WITH PROPOFOL;  Surgeon: Lanelle Bal, DO;  Location: [facility] ENDO SUITE;  Service: Endoscopy;  Laterality: N/A;  10:45 am   ESOPHAGOGASTRODUODENOSCOPY (EGD) WITH PROPOFOL N/A 12/28/2021   Procedure: ESOPHAGOGASTRODUODENOSCOPY (EGD) WITH PROPOFOL;  Surgeon: Lanelle Bal, DO;  Location: [facility] ENDO SUITE;  Service: Endoscopy;  Laterality: N/A;   FEMORAL-POPLITEAL BYPASS GRAFT  11/06/2005   ILIAC ARTERY STENT  10/03/2011   Right CIA stenting   LEFT HEART CATH AND CORONARY ANGIOGRAPHY N/A 04/14/2019   Procedure: LEFT HEART CATH AND CORONARY ANGIOGRAPHY;  Surgeon: Lennette Bihari, MD;  Location: [facility] INVASIVE CV LAB;  Service: Cardiovascular;  Laterality: N/A;  PERIPHERAL VASCULAR BALLOON ANGIOPLASTY Right 01/16/2017   Procedure: PERIPHERAL VASCULAR BALLOON ANGIOPLASTY;  Surgeon: Fransisco Hertz, MD;  Location: [facility] INVASIVE CV LAB;  Service: Cardiovascular;  Laterality: Right;  common iliac    PERIPHERAL VASCULAR BALLOON ANGIOPLASTY  03/09/2020   Procedure: PERIPHERAL VASCULAR BALLOON ANGIOPLASTY;  Surgeon: Cephus Shelling, MD;  Location: [facility]  INVASIVE CV LAB;  Service: Cardiovascular;;   PR VEIN BYPASS GRAFT,AORTO-FEM-POP  06/22/2010   Redo Left Fem-Pop   Repair of left arm fracture     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     SURGERY SCROTAL / TESTICULAR      Social History   Socioeconomic History   Marital status: Married    Spouse name: Not on file   Number of children: Not on file   Years of education: Not on file   Highest education level: Not on file  Occupational History   Not on file  Tobacco Use   Smoking status: Former    Packs/day: 1.00    Years: 45.00    Additional pack years: 0.00    Total pack years: 45.00    Types: Cigarettes, Cigars    Quit date: 09/01/2011    Years since quitting: 10.7    Passive exposure: Never   Smokeless tobacco: Never  Vaping Use   Vaping Use: Never used  Substance and Sexual Activity   Alcohol use: Not Currently   Drug use: No   Sexual activity: Not Currently    Birth control/protection: None  Other Topics Concern   Not on file  Social History Narrative   The patient lives in [place].    Social Determinants of Health   Financial Resource Strain: Not on file  Food Insecurity: Not on file  Transportation Needs: Not on file  Physical Activity: Not on file  Stress: Not on file  Social Connections: Not on file  Intimate Partner Violence: Not on file    Family History  Problem Relation Age of Onset   Heart attack Mother 19   Deep vein thrombosis Mother    Diabetes Mother    Hyperlipidemia Mother    Hypertension Mother    Peripheral vascular disease Mother    Stroke Father    Diabetes Father    Hyperlipidemia Father    Hypertension Father    Peripheral vascular disease Father    Diabetes Sister    Hyperlipidemia Sister    Hypertension Sister    Diabetes Brother    Hyperlipidemia Brother    Cancer Brother    Deep vein thrombosis Brother    Hypertension Brother    Peripheral vascular disease Brother    Hyperlipidemia Son    Cancer Son    Hypertension Son     Diabetes Daughter    Colon cancer Neg Hx     Current Outpatient Medications  Medication Sig Dispense Refill   atorvastatin (LIPITOR) 80 MG tablet TAKE 1 TABLET(80 MG) BY MOUTH DAILY 90 tablet 3   cilostazol (PLETAL) 100 MG tablet TAKE 1 TABLET(100 MG) BY MOUTH TWICE DAILY 180 tablet 3   clopidogrel (PLAVIX) 75 MG tablet Take 1 tablet (75 mg total) by mouth daily. 30 tablet 11   dexlansoprazole (DEXILANT) 60 MG capsule Take 1 capsule (60 mg total) by mouth daily. 30 capsule 11   diclofenac Sodium (VOLTAREN) 1 % GEL Apply 2 gram  to painful foot and ankle joint twice daily. 150 g 1   glipiZIDE (GLUCOTROL) 5 MG tablet Take 5 mg by mouth daily.  HYDROcodone-acetaminophen (NORCO) 7.5-325 MG per tablet Take 1-2 tablets by mouth 2 (two) times daily as needed for moderate pain.     JANUVIA 100 MG tablet Take 100 mg by mouth daily.     JARDIANCE 25 MG TABS tablet Take 25 mg by mouth daily.     metoprolol succinate (TOPROL XL) 25 MG 24 hr tablet Take 1 tablet (25 mg total) by mouth daily. 90 tablet 1   nitroGLYCERIN (NITROSTAT) 0.4 MG SL tablet Place 1 tablet (0.4 mg total) under the tongue every 5 (five) minutes x 3 doses as needed for chest pain (If no relief after 3rd dose, GO TO ER.). 25 tablet 3   Omega-3 Fatty Acids (OMEGA-3 FISH OIL PO) Take 1 capsule by mouth daily.     ONE TOUCH ULTRA TEST test strip      OneTouch Delica Lancets 33G MISC Apply topically.     PROAIR HFA 108 (90 BASE) MCG/ACT inhaler Inhale 2 puffs into the lungs every 6 (six) hours as needed for wheezing or shortness of breath.      sacubitril-valsartan (ENTRESTO) 24-26 MG TAKE 1 TABLET BY MOUTH TWICE DAILY 60 tablet 5   sildenafil (REVATIO) 20 MG tablet Take 40-60 mg by mouth daily as needed for erectile dysfunction.     SYMBICORT 160-4.5 MCG/ACT inhaler INHALE 2 PUFFS FIRST THING IN THE MORNING AND THEN ANOTHER 2 PUFFS 12 HOURS LATER 10.2 g 11   Tiotropium Bromide Monohydrate (SPIRIVA RESPIMAT) 2.5 MCG/ACT AERS INHALE 2  PUFFS INTO THE LUNGS DAILY 4 g 11   XELJANZ 5 MG TABS TAKE 1 TABLET(5 MG) BY MOUTH DAILY 90 tablet 0   No current facility-administered medications for this visit.    No Known Allergies   REVIEW OF SYSTEMS:   [X]  denotes positive finding, [ ]  denotes negative finding Cardiac  Comments:  Chest pain or chest pressure:    Shortness of breath upon exertion:    Short of breath when lying flat:    Irregular heart rhythm:        Vascular    Pain in calf, thigh, or hip brought on by ambulation:    Pain in feet at night that wakes you up from your sleep:     Blood clot in your veins:    Leg swelling:         Pulmonary    Oxygen at home:    Productive cough:     Wheezing:         Neurologic    Sudden weakness in arms or legs:     Sudden numbness in arms or legs:     Sudden onset of difficulty speaking or slurred speech:    Temporary loss of vision in one eye:     Problems with dizziness:         Gastrointestinal    Blood in stool:     Vomited blood:         Genitourinary    Burning when urinating:     Blood in urine:        Psychiatric    Major depression:         Hematologic    Bleeding problems:    Problems with blood clotting too easily:        Skin    Rashes or ulcers:        Constitutional    Fever or chills:      PHYSICAL EXAMINATION:  Vitals:   05/14/22 0956  BP: 105/73  Pulse: 68  Temp: 97.8 F (36.6 C)  TempSrc: Temporal  SpO2: 95%  Weight: 162 lb (73.5 kg)    General:  WDWN in NAD; vital signs documented above Gait: Not observed HENT: WNL, normocephalic Pulmonary: normal non-labored breathing , without Rales, rhonchi,  wheezing Cardiac: regular HR Abdomen: soft, NT, no masses Skin: without rashes Vascular Exam/Pulses: absent pedal pulses Extremities: without ischemic changes, without Gangrene , without cellulitis; without open wounds;  Musculoskeletal: no muscle wasting or atrophy  Neurologic: A&O X 3 Psychiatric:  The pt has Normal  affect.   Non-Invasive Vascular Imaging:    Right common iliac artery stent widely patent Left femoral to above-the-knee popliteal bypass widely patent  ABI/TBIToday's ABIToday's TBIPrevious ABIPrevious TBI  +-------+-----------+-----------+------------+------------+  Right [place]         0.53       [place]          0.48          +-------+-----------+-----------+------------+------------+  Left  1.25       0.31       1.23        0.35          ASSESSMENT/PLAN:: 72 y.o. male here for follow up for surveillance of PAD  -Bilateral lower extremities are well-perfused based on Doppler exam.  He also has a patent right common iliac artery stent on duplex.  Left lower extremity bypass graft also widely patent based on duplex.  TBI's are unchanged since last office visit.  He is without claudication, rest pain, or tissue loss.  He will continue his aspirin, Plavix, statin daily.  He will continue to follow regularly with his PCP for management of chronic medical conditions including type 2 diabetes mellitus, hypertension, and hyperlipidemia.  He will follow-up in office for repeat imaging in 6 months.  He will call/return office sooner if he develops any claudication, rest pain, or tissue loss.   Emilie Rutter, PA-C [facility] [phone]  Clinic MD:   Chestine Spore

## 2022-05-16 DIAGNOSIS — I25119 Atherosclerotic heart disease of native coronary artery with unspecified angina pectoris: Secondary | ICD-10-CM | POA: Diagnosis not present

## 2022-05-16 DIAGNOSIS — Z299 Encounter for prophylactic measures, unspecified: Secondary | ICD-10-CM | POA: Diagnosis not present

## 2022-05-16 DIAGNOSIS — J449 Chronic obstructive pulmonary disease, unspecified: Secondary | ICD-10-CM | POA: Diagnosis not present

## 2022-05-16 DIAGNOSIS — I10 Essential (primary) hypertension: Secondary | ICD-10-CM | POA: Diagnosis not present

## 2022-05-16 DIAGNOSIS — I700 Atherosclerosis of aorta: Secondary | ICD-10-CM | POA: Diagnosis not present

## 2022-05-16 DIAGNOSIS — L03019 Cellulitis of unspecified finger: Secondary | ICD-10-CM | POA: Diagnosis not present

## 2022-05-20 ENCOUNTER — Encounter: Payer: Self-pay | Admitting: Internal Medicine

## 2022-05-21 ENCOUNTER — Ambulatory Visit: Payer: 59 | Admitting: Nurse Practitioner

## 2022-05-21 ENCOUNTER — Encounter: Payer: Self-pay | Admitting: *Deleted

## 2022-05-23 ENCOUNTER — Ambulatory Visit: Payer: 59 | Attending: Nurse Practitioner | Admitting: Nurse Practitioner

## 2022-05-23 ENCOUNTER — Encounter: Payer: Self-pay | Admitting: Nurse Practitioner

## 2022-05-23 VITALS — BP 100/60 | HR 79 | Ht 69.0 in | Wt 164.8 lb

## 2022-05-23 DIAGNOSIS — E785 Hyperlipidemia, unspecified: Secondary | ICD-10-CM | POA: Diagnosis not present

## 2022-05-23 DIAGNOSIS — I77810 Thoracic aortic ectasia: Secondary | ICD-10-CM

## 2022-05-23 DIAGNOSIS — I471 Supraventricular tachycardia, unspecified: Secondary | ICD-10-CM

## 2022-05-23 DIAGNOSIS — R0789 Other chest pain: Secondary | ICD-10-CM

## 2022-05-23 DIAGNOSIS — I10 Essential (primary) hypertension: Secondary | ICD-10-CM

## 2022-05-23 DIAGNOSIS — I5022 Chronic systolic (congestive) heart failure: Secondary | ICD-10-CM | POA: Diagnosis not present

## 2022-05-23 DIAGNOSIS — R002 Palpitations: Secondary | ICD-10-CM | POA: Diagnosis not present

## 2022-05-23 DIAGNOSIS — I739 Peripheral vascular disease, unspecified: Secondary | ICD-10-CM | POA: Diagnosis not present

## 2022-05-23 MED ORDER — METOPROLOL SUCCINATE ER 25 MG PO TB24: 12.5000 mg | ORAL_TABLET | Freq: Every day | ORAL | Status: DC

## 2022-05-23 NOTE — Progress Notes (Signed)
Cardiology Office Note:    Date:  05/23/2022   ID:  [name], DOB 1950-01-22, MRN [id number]  PCP:  Ignatius Specking, MD   [facility] Providers Cardiologist:  Nona Dell, MD     Referring MD: Ignatius Specking, MD   CC: Here for follow-up  History of Present Illness:    [name] is a very pleasant 72 y.o. male with a hx of the following:   HFmrEF Palpitations, PSVT HLD PAD Carotid artery disease COPD T2DM RA HLD HTN GERD Hx of DVT  Previous cardiovascular history of nonobstructive CAD by cardiac catheterization as seen in 2007, 2013, and 2021.  He is closely followed by [facility] for history of PAD, status post left fem-pop bypass in 2007, with PTCA and stenting of right iliac artery in 2013.  Recent cardiac monitor demonstrated brief episodes of PSVT.  Was started on beta-blocker.  Echocardiogram revealed mildly reduced left ventricular systolic function at 45 to 50%, mild apical septal hypokinesis.  Last seen by Dr. Diona Browner on January 17, 2022.  Was doing well at that time.  Claudication was stable, symptoms noted more on right than left.  Lexiscan Myoview was arranged and revealed findings consistent with inferior wall infarct scar and moderate peri-infarct ischemia, EF measured at 53%.  Patient did not take beta-blocker as instructed from previous office visit, Dr. Diona Browner started Toprol-XL 12.5 mg daily.   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   At last visit, he had started taking metoprolol and heart palpitations have stopped. Admitted to atypical CP.  Refill nitroglycerin and increased metoprolol succinate.  Had ED visit at end of March for vertigo.  Has resolved.  Last saw him 04/16/2022 for follow-up.  Was doing well, however admitted to atypical chest pain, unsure if it was similar to previous office visit.  He had a difficult time describing CP, stated he  "does not have the best memory."  Intermittent and brief, noticed at nighttime, has not happened often.  Described as "jumping/tapping," left-sided, stable over time.  Stable, chronic shortness of breath related to COPD.  Recently saw pulmonologist.   Today he presents for follow-up. Doing well.  Believes his blood pressure is a little on the low side, does admit to occasional feeling of dizziness, however he does have a previous diagnosis of vertigo.  His palpitations and atypical chest pain has improved.  PCP believes he pulled a muscle while exercising, which I am in agreement with.  Denies any exertional chest pain, worsening SHOB, palpitations, syncope, presyncope, dizziness, orthopnea, PND, swelling or significant weight changes, acute bleeding, or claudication.  SH: Very active and exercises every day.   Past Medical History:  Diagnosis Date   Anemia    Bronchitis    Carotid artery disease (HCC)    Cervical spine disease    Neck surgery in 2007 with Dr. Jeral Fruit   Collagen vascular disease [facility])    COPD (chronic obstructive pulmonary disease) (HCC)    Coronary atherosclerosis of native coronary artery    Nonobstrtuctive by catheterization 2007, 2013, 2021   DVT (deep venous thrombosis) (HCC)    Essential hypertension    GERD (gastroesophageal reflux disease)    Hyperlipidemia    PAD (peripheral artery disease) (HCC)    s/p left fem-pop bypass 2007; PTA and stenting of right iliac artery 09/2011   Rheumatoid arthritis(714.0)    Type 2 diabetes mellitus (HCC)  Past Surgical History:  Procedure Laterality Date   ABDOMINAL AORTAGRAM N/A 10/03/2011   Procedure: ABDOMINAL AORTAGRAM;  Surgeon: Fransisco Hertz, MD;  Location[facility] CATH LAB;  Service: Cardiovascular;  Laterality: N/A;   ABDOMINAL AORTOGRAM W/LOWER EXTREMITY N/A 01/16/2017   Procedure: ABDOMINAL AORTOGRAM W/LOWER EXTREMITY;  Surgeon: Fransisco Hertz, MD;  Location: [facility] INVASIVE CV LAB;  Service: Cardiovascular;  Laterality: N/A;    ABDOMINAL AORTOGRAM W/LOWER EXTREMITY Bilateral 08/04/2019   Procedure: ABDOMINAL AORTOGRAM W/LOWER EXTREMITY;  Surgeon: Cephus Shelling, MD;  Location: [facility] INVASIVE CV LAB;  Service: Cardiovascular;  Laterality: Bilateral;   ABDOMINAL AORTOGRAM W/LOWER EXTREMITY N/A 03/09/2020   Procedure: ABDOMINAL AORTOGRAM W/LOWER EXTREMITY;  Surgeon: Cephus Shelling, MD;  Location: [facility] INVASIVE CV LAB;  Service: Cardiovascular;  Laterality: N/A;   BIOPSY N/A 12/28/2021   Procedure: BIOPSY;  Surgeon: Lanelle Bal, DO;  Location: [facility] ENDO SUITE;  Service: Endoscopy;  Laterality: N/A;   CERVICAL DISC SURGERY     COLONOSCOPY  2013   Hyperplastic polyp   COLONOSCOPY WITH PROPOFOL N/A 02/17/2017   Surgeon: Corbin Ade, MD; Many small and large mouth diverticula in the sigmoid, descending, transverse colon, internal hemorrhoids that were medium sized and grade 1.   COLONOSCOPY WITH PROPOFOL N/A 12/28/2021   Procedure: COLONOSCOPY WITH PROPOFOL;  Surgeon: Lanelle Bal, DO;  Location: [facility] END[facility] SUITE;  Service: Endoscopy;  Laterality: N/A;  10:45 am   ESOPHAGOGASTRODUODENOSCOPY (EGD) WITH PROPOFOL N/A 12/28/2021   Procedure: ESOPHAGOGASTRODUODENOSCOPY (EGD) WITH PROPOFOL;  Surgeon: Lanelle Bal, DO;  Location: [facility] SUITE;  Service: Endoscopy;  Laterality: N/A;   FEMORAL-POPLITEAL BYPASS GRAFT  11/06/2005   ILIAC ARTERY STENT  10/03/2011   Right CIA stenting   LEFT HEART CATH AND CORONARY ANGIOGRAPHY N/A 04/14/2019   Procedure: LEFT HEART CATH AND CORONARY ANGIOGRAPHY;  Surgeon: Lennette Bihari, MD;  Location: [facility] INVASIVE CV LAB;  Service: Cardiovascular;  Laterality: N/A;   PERIPHERAL VASCULAR BALLOON ANGIOPLASTY Right 01/16/2017   Procedure: PERIPHERAL VASCULAR BALLOON ANGIOPLASTY;  Surgeon: Fransisco Hertz, MD;  Location: [facility] INVASIVE CV LAB;  Service: Cardiovascular;  Laterality: Right;  common iliac    PERIPHERAL VASCULAR BALLOON ANGIOPLASTY  03/09/2020   Procedure: PERIPHERAL  VASCULAR BALLOON ANGIOPLASTY;  Surgeon: Cephus Shelling, MD;  Location: [facility] INVASIVE CV LAB;  Service: Cardiovascular;;   PR VEIN BYPASS GRAFT,AORTO-FEM-POP  06/22/2010   Redo Left Fem-Pop   Repair of left arm fracture     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   dexlansoprazole (DEXILANT) 60 MG capsule Take 1 capsule (60 mg total) by mouth daily.   diclofenac Sodium (VOLTAREN) 1 % GEL Apply 2 gram  to painful foot and ankle joint twice daily.   glipiZIDE (GLUCOTROL) 5 MG tablet Take 5 mg by mouth daily.   HYDROcodone-acetaminophen (NORCO) 7.5-325 MG per tablet Take 1-2 tablets by mouth 2 (two) times daily as needed for moderate pain.   JANUVIA 100 MG tablet Take 100 mg by mouth daily.   JARDIANCE 25 MG TABS tablet Take 25 mg by mouth daily.   metoprolol succinate (TOPROL XL) 25 MG 24 hr tablet Take 1  tablet (25 mg total) by mouth daily.   nitroGLYCERIN (NITROSTAT) 0.4 MG SL tablet Place 1 tablet (0.4 mg total) under the tongue every 5 (five) minutes x 3 doses as needed for chest pain (If no relief after 3rd dose, GO TO ER.).   Omega-3 Fatty Acids (OMEGA-3 FISH OIL PO) Take 1 capsule by mouth daily.   ONE TOUCH ULTRA TEST test strip    OneTouch Delica Lancets 33G MISC Apply topically.   PROAIR HFA 108 (90 BASE) MCG/ACT inhaler Inhale 2 puffs into the lungs every 6 (six) hours as needed for wheezing or shortness of breath.    sacubitril-valsartan (ENTRESTO) 24-26 MG TAKE 1 TABLET BY MOUTH TWICE DAILY   sildenafil (REVATIO) 20 MG tablet Take 40-60 mg by mouth daily as needed for erectile dysfunction.   SYMBICORT 160-4.5 MCG/ACT inhaler INHALE 2 PUFFS FIRST THING IN THE MORNING AND THEN ANOTHER 2 PUFFS 12  HOURS LATER   Tiotropium Bromide Monohydrate (SPIRIVA RESPIMAT) 2.5 MCG/ACT AERS INHALE 2 PUFFS INTO THE LUNGS DAILY   XELJANZ 5 MG TABS TAKE 1 TABLET(5 MG) BY MOUTH DAILY     Allergies:   Patient has no known allergies.   Social History   Socioeconomic History   Marital status: Married    Spouse name: Not on file   Number of children: Not on file   Years of education: Not on file   Highest education level: Not on file  Occupational History   Not on file  Tobacco Use   Smoking status: Former    Packs/day: 1.00    Years: 45.00    Additional pack years: 0.00    Total pack years: 45.00    Types: Cigarettes, Cigars    Quit date: 09/01/2011    Years since quitting: 10.7    Passive exposure: Never   Smokeless tobacco: Never  Vaping Use   Vaping Use: Never used  Substance and Sexual Activity   Alcohol use: Not Currently   Drug use: No   Sexual activity: Not Currently    Birth control/protection: None  Other Topics Concern   Not on file  Social History Narrative   The patient lives in [place].    Social Determinants of Health   Financial Resource Strain: Not on file  Food Insecurity: Not on file  Transportation Needs: Not on file  Physical Activity: Not on file  Stress: Not on file  Social Connections: Not on file     Family History: The patient's family history includes Cancer in his brother and son; Deep vein thrombosis in his brother and mother; Diabetes in his brother, daughter, father, mother, and sister; Heart attack (age of onset: 77) in his mother; Hyperlipidemia in his brother, father, mother, sister, and son; Hypertension in his brother, father, mother, sister, and son; Peripheral vascular disease in his brother, father, and mother; Stroke in his father. There is no history of Colon cancer.  ROS:   Please see the history of present illness.     All other systems reviewed and are negative.  EKGs/Labs/Other Studies Reviewed:    The following studies were  reviewed today:   EKG:  EKG is not ordered today.    Carotid Doppler 03/2022: Summary:  Right Carotid: Velocities in the right ICA are consistent with a 1-39%  stenosis.   Left Carotid: Velocities in the left ICA are consistent with a 1-39%  stenosis.   Vertebrals: Bilateral vertebral arteries demonstrate antegrade flow.  Subclavians: Normal flow hemodynamics were seen in bilateral  subclavian               arteries.   Myoview on 01/24/2022:    Findings are consistent with prior inferior infarction with mild peri-infarct ischemia. The study is low risk.   No ST deviation was noted.   LV perfusion is abnormal. Moderate size moderate intensity inferior defect with mild reversibility.   Left ventricular function is normal. Nuclear stress EF: 53 %. The left ventricular ejection fraction is normal (55-65%). End diastolic cavity size is normal.      Echocardiogram on 01/10/2022:  1. Left ventricular ejection fraction, by estimation, is 45 to 50%. The  left ventricle has mildly decreased function. The left ventricle  demonstrates regional wall motion abnormalities (see scoring  diagram/findings for description). There is mild  concentric left ventricular hypertrophy. Left ventricular diastolic  parameters are consistent with Grade I diastolic dysfunction (impaired  relaxation).   2. Right ventricular systolic function is normal. The right ventricular  size is normal.   3. Left atrial size was upper normal.   4. The mitral valve is grossly normal. Trivial mitral valve  regurgitation.   5. The aortic valve is tricuspid. There is mild calcification of the  aortic valve. Aortic valve regurgitation is mild. Aortic regurgitation PHT  measures 531 msec. Aortic valve mean gradient measures 8.0 mmHg.   6. Aortic dilatation noted. There is borderline dilatation of the aortic  root, measuring 39 mm.   7. The inferior vena cava is normal in size with greater than 50%  respiratory variability,  suggesting right atrial pressure of 3 mmHg.   Comparison(s): Prior images unable to be directly viewed. LVEF reported at  50% at angiography in 2021.  Monitor on 01/01/2022:  Patient had a min HR of 59 bpm (sinus bradycardia), max HR of 203 bpm (1.9 second duration of brief supraventricular tachycardia), and avg HR of 101 bpm (sinus tachycardia). Predominant underlying rhythm was Sinus Rhythm. 6 Supraventricular Tachycardia runs occurred, the run with the fastest interval lasting 5 beats (1.9 second duration) with a max rate of 203 bpm, the longest lasting 19 beats (6.8 seconds) with an avg rate of 163 bpm. Isolated SVEs were occasional (1.4%, 13864), SVE Couplets were rare (<1.0%, 88), and SVE Triplets were rare (<1.0%, 5). Isolated VEs were rare (<1.0%, 3894), VE Couplets were rare (<1.0%, 22), and VE Triplets were rare (<1.0%, 1). Ventricular Bigeminy was present.    Impression: Brief supraventricular tachycardia detected (6 episodes in 7 days; longest duration 6.8 seconds).  Occasional PACs (1.4% burden).   Cardiac cath on 04/14/2019:  Prox LAD to Mid LAD lesion is 20% stenosed. Mid LAD lesion is 30% stenosed. Prox RCA lesion is 30% stenosed.   Mild non-obstructive CAD with 20 and 30% irregularities in the proximal to mid LAD; normal ramus intermediate and left circumflex coronary arteries;  and RCA with smooth 30% proximal narrowing with distal vessel tortuosity.   Low normal global LV function with EF estimated at 50%; LVEDP 14 mmHg.   RECOMMENDATION: Medical therapy for CAD.  Will increase lipid-lowering therapy to atorvastatin 40 mg.  Maintain optimal blood pressure control with target less than 130/80 and ideal blood pressure less than 120/80.  Target LDL less than 70.  Recent Labs: 09/11/2021: ALT 12 03/19/2022: BUN 15; Creatinine, Ser 0.91; Hemoglobin 13.7; Magnesium 2.0; Platelets 316; Potassium 4.1; Sodium 138  Recent Lipid Panel    Component Value Date/Time   CHOL 146  09/11/2021 0937   CHOL 160 11/09/2019 1032  TRIG 56 09/11/2021 0937   HDL 57 09/11/2021 0937   HDL 61 11/09/2019 1032   CHOLHDL 2.6 09/11/2021 0937   VLDL 17 01/25/2021 1118   LDLCALC 75 09/11/2021 0937     Risk Assessment/Calculations:    The 10-year ASCVD risk score (Arnett DK, et al., 2019) is: 20.3%   Values used to calculate the score:     Age: 39 years     Sex: Male     Is Non-Hispanic African American: Yes     Diabetic: Yes     Tobacco smoker: No     Systolic Blood Pressure: 100 mmHg     Is BP treated: Yes     HDL Cholesterol: 57 mg/dL     Total Cholesterol: 146 mg/dL  Physical Exam:    VS:  BP 100/60   Pulse 79   Ht 5\' 9"  (1.753 m)   Wt 164 lb 12.8 oz (74.8 kg)   SpO2 93%   BMI 24.34 kg/m     Wt Readings from Last 3 Encounters:  05/23/22 164 lb 12.8 oz (74.8 kg)  05/14/22 162 lb (73.5 kg)  04/16/22 166 lb 6.4 oz (75.5 kg)     GEN:  Well nourished, well developed in no acute distress HEENT: Normal NECK: No JVD; Right carotid bruit, no left carotid bruit CARDIAC: S1/S2, RRR, no murmurs, rubs, gallops; 2+ pulses RESPIRATORY:  Clear to auscultation without rales, wheezing or rhonchi  MUSCULOSKELETAL:  No edema; No deformity  SKIN: Warm and dry NEUROLOGIC:  Alert and oriented x 3 PSYCHIATRIC:  Normal affect   ASSESSMENT:    1. Heart failure with mildly reduced ejection fraction (HFmrEF) (HCC)   2. Atypical chest pain   3. Palpitations   4. PSVT (paroxysmal supraventricular tachycardia)   5. Hyperlipidemia, unspecified hyperlipidemia type   6. PAD (peripheral artery disease) (HCC)   7. Essential hypertension, benign   8. Aortic root dilatation (HCC)      PLAN:    In order of problems listed above:  HFmrEF TTE 01/2022 showed EF mildly reduced at 45-50%, RWMA, mild LVH, grade 1 DD. Euvolemic and well compensated on exam. Continue GDMT -BP does not allow room to uptitrate GDMT at this time, will reduce metoprolol to 12.5 mg daily. Low sodium diet,  fluid restriction <2L, and daily weights encouraged. Educated to contact our office for weight gain of 2 lbs overnight or 5 lbs in one week. Heart healthy diet and regular cardiovascular exercise encouraged.  If no improvement in blood pressure by next office visit, plan to stop Entresto and switch back to losartan.  Atypical chest pain Improvement since last visit, points to musculoskeletal in etiology.  Denies any exertional symptoms. NST 01/2022 showed inferior wall infarct scar and moderate per-infarct ischemia. Continue current medication regimen at this time. Recommended Tylenol 1,000 mg BID. No medication changes. Heart healthy diet and regular cardiovascular exercise encouraged. ED precautions discussed.    Palpitations, PSVT Improvement since last OV. Continue medication regimen and will continue to monitor for now. Heart healthy diet and regular cardiovascular exercise encouraged.    HLD, PAD Last LDL 75. He is closely followed by [facility] for history of PAD, status post left fem-pop bypass in 2007, with PTCA and stenting of right iliac artery in 2013.  Recently saw Dr. Chestine Spore and underwent study that revealed patent left common femoral-popliteal artery bypass graft.  Carotid Doppler study as mentioned above.  Continue Pletal, Plavix, and Atorvastatin. Heart healthy diet and regular cardiovascular exercise encouraged.  HTN BP soft.  Will reduce metoprolol in half to 12.5 mg daily.  Continue rest of current medication regimen. Discussed to monitor BP at home at least 2 hours after medications and sitting for 5-10 minutes. Heart healthy diet and regular cardiovascular exercise encouraged.   Aortic root dilatation Borderline dilatation of aortic root noted on prevoius Echo, measuring 39 mm. Plan to update study in 1 year or sooner if needed.  Has upcoming low-dose CT scan scheduled for June 17, 2022.   Disposition: Follow-up with me or APP in 2 to 3 months or sooner if anything changes.        Medication Adjustments/Labs and Tests Ordered: Current medicines are reviewed at length with the patient today.  Concerns regarding medicines are outlined above.  No orders of the defined types were placed in this encounter.  Meds ordered this encounter  Medications   metoprolol succinate (TOPROL XL) 25 MG 24 hr tablet    Sig: Take 0.5 tablets (12.5 mg total) by mouth daily.    Dose decreased 05/23/2022    Patient Instructions  Medication Instructions:   Decrease your Toprol to 12.5mg  daily  Continue all other medications.     Labwork:  none  Testing/Procedures:  none  Follow-Up:  2-3 months   Any Other Special Instructions Will Be Listed Below (If Applicable).   If you need a refill on your cardiac medications before your next appointment, please call your pharmacy.    SignedSharlene Dory, NP  05/23/2022 9:03 AM    [facility]

## 2022-05-23 NOTE — Patient Instructions (Addendum)
Medication Instructions:   Decrease your Toprol to 12.5mg  daily  Continue all other medications.     Labwork:  none  Testing/Procedures:  none  Follow-Up:  2-3 months   Any Other Special Instructions Will Be Listed Below (If Applicable).   If you need a refill on your cardiac medications before your next appointment, please call your pharmacy.

## 2022-05-27 ENCOUNTER — Telehealth: Payer: Self-pay | Admitting: Nurse Practitioner

## 2022-05-27 ENCOUNTER — Other Ambulatory Visit: Payer: Self-pay

## 2022-05-27 DIAGNOSIS — I739 Peripheral vascular disease, unspecified: Secondary | ICD-10-CM

## 2022-05-27 DIAGNOSIS — Z95828 Presence of other vascular implants and grafts: Secondary | ICD-10-CM

## 2022-05-27 DIAGNOSIS — I70213 Atherosclerosis of native arteries of extremities with intermittent claudication, bilateral legs: Secondary | ICD-10-CM

## 2022-05-27 NOTE — Telephone Encounter (Signed)
Pt c/o of Chest Pain: STAT if CP now or developed within 24 hours  1. Are you having CP right now? Been having a lot of chest pains- not at this time  2. Are you experiencing any other symptoms (ex. SOB, nausea, vomiting, sweating)?  Dizzy, short of breath, but he  has COPD, blood pressure he says is very low  3. How long have you been experiencing CP? Started last Monday evening  4. Is your CP continuous or coming and going? Comes and goes  5. Have you taken Nitroglycerin? No, he says he has some. He wants to know, should he take it? ?

## 2022-05-27 NOTE — Telephone Encounter (Signed)
Reports recent visit with Philis Nettle and toprol xl was decreased to 12.5 mg daily. Reports BP's are still low and he still has dizziness. Reports BP has been 106/64 & HR 116 05/26/2022; 05/25/2022 97/64 & HR 108; 112/67 HR 104. Reports last dose of toprol xl and entresto was Saturday. 05/25/2022. Today BP 105/69 & HR 110. Reports intermittent chest pain since Monday after visit with [facility] rated 8/10, that lasted 1-2 seconds. Denies active chest pain. Explained directions for nitroglycerin 0.4 mg Dissolve one under tongue for chest pain every 5 minutes up to 3 doses. If no relief, proceed to ED. Advised that he should go ahead and take metoprolol succinate 12.5 mg for HR and message would be sent to provider for further recommendations. Verbalized understanding of plan.

## 2022-05-28 NOTE — Telephone Encounter (Signed)
Patient informed and verbalized understanding of plan. 

## 2022-05-29 DIAGNOSIS — I10 Essential (primary) hypertension: Secondary | ICD-10-CM | POA: Diagnosis not present

## 2022-05-29 DIAGNOSIS — I739 Peripheral vascular disease, unspecified: Secondary | ICD-10-CM | POA: Diagnosis not present

## 2022-05-29 DIAGNOSIS — E7800 Pure hypercholesterolemia, unspecified: Secondary | ICD-10-CM | POA: Diagnosis not present

## 2022-05-29 DIAGNOSIS — R221 Localized swelling, mass and lump, neck: Secondary | ICD-10-CM | POA: Diagnosis not present

## 2022-05-29 DIAGNOSIS — Z0000 Encounter for general adult medical examination without abnormal findings: Secondary | ICD-10-CM | POA: Diagnosis not present

## 2022-05-29 DIAGNOSIS — R5383 Other fatigue: Secondary | ICD-10-CM | POA: Diagnosis not present

## 2022-05-29 DIAGNOSIS — Z87891 Personal history of nicotine dependence: Secondary | ICD-10-CM | POA: Diagnosis not present

## 2022-05-29 DIAGNOSIS — Z299 Encounter for prophylactic measures, unspecified: Secondary | ICD-10-CM | POA: Diagnosis not present

## 2022-05-30 DIAGNOSIS — Z79899 Other long term (current) drug therapy: Secondary | ICD-10-CM | POA: Diagnosis not present

## 2022-05-30 DIAGNOSIS — R5383 Other fatigue: Secondary | ICD-10-CM | POA: Diagnosis not present

## 2022-05-30 DIAGNOSIS — E7800 Pure hypercholesterolemia, unspecified: Secondary | ICD-10-CM | POA: Diagnosis not present

## 2022-05-31 DIAGNOSIS — R221 Localized swelling, mass and lump, neck: Secondary | ICD-10-CM | POA: Diagnosis not present

## 2022-06-07 DIAGNOSIS — E1165 Type 2 diabetes mellitus with hyperglycemia: Secondary | ICD-10-CM | POA: Diagnosis not present

## 2022-06-07 DIAGNOSIS — I10 Essential (primary) hypertension: Secondary | ICD-10-CM | POA: Diagnosis not present

## 2022-06-10 ENCOUNTER — Other Ambulatory Visit: Payer: Self-pay | Admitting: Physician Assistant

## 2022-06-10 NOTE — Telephone Encounter (Signed)
Last Fill: 02/05/2022  Labs: 04/03/2022 WBC 14.1, MCHC 31.8, RDW 17.5, Absolute Neutrophils 11.8, Glucose 250, Total Protein 8.2, AST 14  TB Gold: 12/11/2021   TB gold negative   Next Visit: 07/18/2022  Last Visit: 04/11/2022   [id number] arthritis of multiple sites with negative rheumatoid factor   Current Dose per office note 04/11/2022: [name] 5 mg 1 tablet by mouth daily.   Okay to refill [name]?

## 2022-06-11 DIAGNOSIS — Z299 Encounter for prophylactic measures, unspecified: Secondary | ICD-10-CM | POA: Diagnosis not present

## 2022-06-11 DIAGNOSIS — I10 Essential (primary) hypertension: Secondary | ICD-10-CM | POA: Diagnosis not present

## 2022-06-11 DIAGNOSIS — Z6823 Body mass index (BMI) 23.0-23.9, adult: Secondary | ICD-10-CM | POA: Diagnosis not present

## 2022-06-12 ENCOUNTER — Ambulatory Visit (INDEPENDENT_AMBULATORY_CARE_PROVIDER_SITE_OTHER): Payer: 59 | Admitting: Acute Care

## 2022-06-12 ENCOUNTER — Encounter: Payer: Self-pay | Admitting: Acute Care

## 2022-06-12 DIAGNOSIS — Z87891 Personal history of nicotine dependence: Secondary | ICD-10-CM | POA: Diagnosis not present

## 2022-06-12 NOTE — Patient Instructions (Signed)
Thank you for participating in the [facility]. It was our pleasure to meet you today. We will call you with the results of your scan within the next few days. Your scan will be assigned a Lung RADS category score by the physicians reading the scans.  This Lung RADS score determines follow up scanning.  See below for description of categories, and follow up screening recommendations. We will be in touch to schedule your follow up screening annually or based on recommendations of our providers. We will fax a copy of your scan results to your Primary Care Physician, or the physician who referred you to the program, to ensure they have the results. Please call the office if you have any questions or concerns regarding your scanning experience or results.  Our office number is [phone]. Please speak with Denise Phelps, RN. , or  Denise Buckner RN, They are  our Lung Cancer Screening RN.'s If They are unavailable when you call, Please leave a message on the voice mail. We will return your call at our earliest convenience.This voice mail is monitored several times a day.  Remember, if your scan is normal, we will scan you annually as long as you continue to meet the criteria for the program. (Age 50-80, Current smoker or smoker who has quit within the last 15 years). If you are a smoker, remember, quitting is the single most powerful action that you can take to decrease your risk of lung cancer and other pulmonary, breathing related problems. We know quitting is hard, and we are here to help.  Please let us know if there is anything we can do to help you meet your goal of quitting. If you are a former smoker, congratulations. We are proud of you! Remain smoke free! Remember you can refer friends or family members through the number above.  We will screen them to make sure they meet criteria for the program. Thank you for helping us take better care of you by  participating in Lung Screening.  You can receive free nicotine replacement therapy ( patches, gum or mints) by calling 1-800-QUIT NOW. Please call so we can get you on the path to becoming  a non-smoker. I know it is hard, but you can do this!  Lung RADS Categories:  Lung RADS 1: no nodules or definitely non-concerning nodules.  Recommendation is for a repeat annual scan in 12 months.  Lung RADS 2:  nodules that are non-concerning in appearance and behavior with a very low likelihood of becoming an active cancer. Recommendation is for a repeat annual scan in 12 months.  Lung RADS 3: nodules that are probably non-concerning , includes nodules with a low likelihood of becoming an active cancer.  Recommendation is for a 6-month repeat screening scan. Often noted after an upper respiratory illness. We will be in touch to make sure you have no questions, and to schedule your 6-month scan.  Lung RADS 4 A: nodules with concerning findings, recommendation is most often for a follow up scan in 3 months or additional testing based on our provider's assessment of the scan. We will be in touch to make sure you have no questions and to schedule the recommended 3 month follow up scan.  Lung RADS 4 B:  indicates findings that are concerning. We will be in touch with you to schedule additional diagnostic testing based on our provider's  assessment of the scan.  Other options for assistance in smoking cessation (   As covered by your insurance benefits)  Hypnosis for smoking cessation  [employer] [phone]  Acupuncture for smoking cessation  [facility] [phone]   

## 2022-06-12 NOTE — Progress Notes (Signed)
Virtual Visit via Telephone Note  I connected with [name] on 06/12/22 at  9:30 AM EDT by telephone and verified that I am speaking with the correct person using two identifiers.  Location: Patient:  At home Provider:  [address] [place], [place], [address]    I discussed the limitations, risks, security and privacy concerns of performing an evaluation and management service by telephone and the availability of in person appointments. I also discussed with the patient that there may be a patient responsible charge related to this service. The patient expressed understanding and agreed to proceed.    Shared Decision Making Visit Lung Cancer Screening Program [id number])   Eligibility: Age 72 y.o. Pack Years Smoking History Calculation 68 pack year smoking history (# packs/per year x # years smoked) Recent History of coughing up blood  no Unexplained weight loss? no ( >Than 15 pounds within the last 6 months ) Prior History Lung / other cancer no (Diagnosis within the last 5 years already requiring surveillance chest CT Scans). Smoking Status Former Smoker Former Smokers: Years since quit: 10 years  Quit Date: 09/01/2011  Visit Components: Discussion included one or more decision making aids. yes Discussion included risk/benefits of screening. yes Discussion included potential follow up diagnostic testing for abnormal scans. yes Discussion included meaning and risk of over diagnosis. yes Discussion included meaning and risk of False Positives. yes Discussion included meaning of total radiation exposure. yes  Counseling Included: Importance of adherence to annual lung cancer LDCT screening. yes Impact of comorbidities on ability to participate in the program. yes Ability and willingness to under diagnostic treatment. yes  Smoking Cessation Counseling: Current Smokers:  Discussed importance of smoking cessation. yes Information about tobacco cessation classes and  interventions provided to patient. yes Patient provided with "ticket" for LDCT Scan. yes Symptomatic Patient. no  Counseling NA Diagnosis Code: Tobacco Use Z72.0 Asymptomatic Patient yes  Counseling (Intermediate counseling: > three minutes counseling) U0454 Former Smokers:  Discussed the importance of maintaining cigarette abstinence. yes Diagnosis Code: Personal History of Nicotine Dependence. U98.119 Information about tobacco cessation classes and interventions provided to patient. Yes Patient provided with "ticket" for LDCT Scan. yes Written Order for Lung Cancer Screening with LDCT placed in Epic. Yes (CT Chest Lung Cancer Screening Low Dose W/O CM) [id number] Z12.2-Screening of respiratory organs Z87.891-Personal history of nicotine dependence  I spent 25 minutes of face to face time/virtual visit time  with  [name] discussing the risks and benefits of lung cancer screening. We took the time to pause the power point at intervals to allow for questions to be asked and answered to ensure understanding. We discussed that he had taken the single most powerful action possible to decrease his risk of developing lung cancer when he quit smoking. I counseled him to remain smoke free, and to contact me if he ever had the desire to smoke again so that I can provide resources and tools to help support the effort to remain smoke free. We discussed the time and location of the scan, and that either  Abigail Miyamoto RN, Karlton Lemon, RN or I  or I will call / send a letter with the results within  24-72 hours of receiving them. He has the office contact information in the event je needs to speak with me,  he verbalized understanding of all of the above and had no further questions upon leaving the office.     I explained to the patient that there  has been a high incidence of coronary artery disease noted on these exams. I explained that this is a non-gated exam therefore degree or severity cannot be  determined. This patient is on statin therapy. I have asked the patient to follow-up with their PCP regarding any incidental finding of coronary artery disease and management with diet or medication as they feel is clinically indicated. The patient verbalized understanding of the above and had no further questions.     Bevelyn Ngo, NP 06/12/2022

## 2022-06-17 ENCOUNTER — Ambulatory Visit (HOSPITAL_COMMUNITY)
Admission: RE | Admit: 2022-06-17 | Discharge: 2022-06-17 | Disposition: A | Discharge status: Home / Self Care | Payer: 59 | Source: Ambulatory Visit

## 2022-06-17 DIAGNOSIS — Z87891 Personal history of nicotine dependence: Secondary | ICD-10-CM

## 2022-06-19 ENCOUNTER — Other Ambulatory Visit: Payer: Self-pay | Admitting: Acute Care

## 2022-06-19 ENCOUNTER — Ambulatory Visit (HOSPITAL_COMMUNITY)
Admission: RE | Admit: 2022-06-19 | Discharge: 2022-06-19 | Disposition: A | Discharge status: Home / Self Care | Payer: 59 | Source: Ambulatory Visit | Attending: Acute Care | Admitting: Acute Care

## 2022-06-19 DIAGNOSIS — Z87891 Personal history of nicotine dependence: Secondary | ICD-10-CM | POA: Insufficient documentation

## 2022-06-19 DIAGNOSIS — Z122 Encounter for screening for malignant neoplasm of respiratory organs: Secondary | ICD-10-CM

## 2022-06-20 ENCOUNTER — Encounter: Payer: Self-pay | Admitting: Internal Medicine

## 2022-06-20 ENCOUNTER — Encounter: Payer: Self-pay | Admitting: *Deleted

## 2022-06-20 ENCOUNTER — Ambulatory Visit (INDEPENDENT_AMBULATORY_CARE_PROVIDER_SITE_OTHER): Payer: 59 | Admitting: Internal Medicine

## 2022-06-20 ENCOUNTER — Telehealth: Payer: Self-pay | Admitting: *Deleted

## 2022-06-20 ENCOUNTER — Other Ambulatory Visit: Payer: Self-pay | Admitting: *Deleted

## 2022-06-20 VITALS — BP 125/78 | HR 84 | Temp 97.7°F | Ht 69.0 in | Wt 165.2 lb

## 2022-06-20 DIAGNOSIS — R1032 Left lower quadrant pain: Secondary | ICD-10-CM

## 2022-06-20 DIAGNOSIS — K219 Gastro-esophageal reflux disease without esophagitis: Secondary | ICD-10-CM | POA: Diagnosis not present

## 2022-06-20 DIAGNOSIS — D490 Neoplasm of unspecified behavior of digestive system: Secondary | ICD-10-CM | POA: Diagnosis not present

## 2022-06-20 DIAGNOSIS — K581 Irritable bowel syndrome with constipation: Secondary | ICD-10-CM

## 2022-06-20 NOTE — Patient Instructions (Signed)
For your constipation and abdominal pain, I am going to give you samples of Linzess.  I want you to take 2 tablets each morning on an empty stomach.  You may have an initial washout.  This should improve after a few days.  Call us next week and let us know how you are doing.  We have room to increase or decrease dose depending on how you respond.  Linzess works best when taken once a day every day, on an empty stomach, at least 30 minutes before your first meal of the day.  When Linzess is taken daily as directed:  *Constipation relief is typically felt in about a week *IBS-C patients may begin to experience relief from belly pain and overall abdominal symptoms (pain, discomfort, and bloating) in about 1 week,   with symptoms typically improving over 12 weeks.  Diarrhea may occur in the first 2 weeks -keep taking it.  The diarrhea should go away and you should start having normal, complete, full bowel movements. It may be helpful to start treatment when you can be near the comfort of your own bathroom, such as a weekend.   I am also going to order a CT abdomen pelvis to reevaluate the cyst in your colon.  We will call with results.  Follow-up in 3 months.  It was very nice seeing you again today.  Dr. Marletta Lor

## 2022-06-20 NOTE — Progress Notes (Signed)
Referring Provider: Ignatius Specking, MD Primary Care Physician:  Ignatius Specking, MD Primary GI:  Dr. Marletta Lor  Chief Complaint  Patient presents with   Follow-up    Follow up and pain that comes and goes left side. Thinks gas or constipation    HPI:   [name] is a 72 y.o. male who presents to clinic today for follow-up visit.  History of chronic GERD currently taking Dexilant 60 mg daily. Well controlled  EGD 12/28/2021 with findings suggestive of Barrett's esophagus.  Biopsy showed reflux changes and intestinal metaplasia.  Also noted gastritis with biopsies positive for H. pylori.  He was treated with quad therapy with tetracycline, metronidazole, bismuth, Dexilant.  Eradication testing (stool antigen) - 03/29/2022.  Colonoscopy performed 12/28/2021 for iron deficiency anemia.  Diverticulosis of the sigmoid and descending colon noted.  He had a 2 to 3 cm submucosal mass in his ascending colon.  Subsequent CT scan which I personally reviewed showed 2.4 x 2.0 cm cystic-appearing mass in the ascending colon.  He had a CT abdomen pelvis in 2019 which also mentioned this though it measured 3 cm at that time.  Interestingly however, subsequent colonoscopy in 2019 did not show a cystic mass. The CT report in 2019 also mention seeing this mass lesion on CT in 2017 as "similar yet smaller solid and cystic lesion."  I discussed case further with radiology and personally reviewed films with them who confirmed likely cystic mass.  No evidence of metastases.  Discussed case with general surgery as well and patient saw Dr. Lovell Sheehan in office.  Recommended surveillance.    Today, patient complaining of constipation. Notes associated left sided abdominal pain. States after he was cleaned out for colonoscopy, the pain resolved.   Past Medical History:  Diagnosis Date   Anemia    Bronchitis    Carotid artery disease (HCC)    Cervical spine disease    Neck surgery in 2007 with Dr. Jeral Fruit    Collagen vascular disease [facility])    COPD (chronic obstructive pulmonary disease) (HCC)    Coronary atherosclerosis of native coronary artery    Nonobstrtuctive by catheterization 2007, 2013, 2021   DVT (deep venous thrombosis) (HCC)    Essential hypertension    GERD (gastroesophageal reflux disease)    Hyperlipidemia    PAD (peripheral artery disease) (HCC)    s/p left fem-pop bypass 2007; PTA and stenting of right iliac artery 09/2011   Rheumatoid arthritis(714.0)    Type 2 diabetes mellitus (HCC)     Past Surgical History:  Procedure Laterality Date   ABDOMINAL AORTAGRAM N/A 10/03/2011   Procedure: ABDOMINAL Ronny Flurry;  Surgeon: Fransisco Hertz, MD;  Location[facility] CATH LAB;  Service: Cardiovascular;  Laterality: N/A;   ABDOMINAL AORTOGRAM W/LOWER EXTREMITY N/A 01/16/2017   Procedure: ABDOMINAL AORTOGRAM W/LOWER EXTREMITY;  Surgeon: Fransisco Hertz, MD;  Location: [facility] INVASIVE CV LAB;  Service: Cardiovascular;  Laterality: N/A;   ABDOMINAL AORTOGRAM W/LOWER EXTREMITY Bilateral 08/04/2019   Procedure: ABDOMINAL AORTOGRAM W/LOWER EXTREMITY;  Surgeon: Cephus Shelling, MD;  Location: [facility] INVASIVE CV LAB;  Service: Cardiovascular;  Laterality: Bilateral;   ABDOMINAL AORTOGRAM W/LOWER EXTREMITY N/A 03/09/2020   Procedure: ABDOMINAL AORTOGRAM W/LOWER EXTREMITY;  Surgeon: Cephus Shelling, MD;  Location: [facility] INVASIVE CV LAB;  Service: Cardiovascular;  Laterality: N/A;   BIOPSY N/A 12/28/2021   Procedure: BIOPSY;  Surgeon: Lanelle Bal, DO;  Location: [facility] ENDO SUITE;  Service: Endoscopy;  Laterality: N/A;   CERVICAL DISC  SURGERY     COLONOSCOPY  2013   Hyperplastic polyp   COLONOSCOPY WITH PROPOFOL N/A 02/17/2017   Surgeon: Corbin Ade, MD; Many small and large mouth diverticula in the sigmoid, descending, transverse colon, internal hemorrhoids that were medium sized and grade 1.   COLONOSCOPY WITH PROPOFOL N/A 12/28/2021   Procedure: COLONOSCOPY WITH PROPOFOL;  Surgeon: Lanelle Bal, DO;  Location: [facility] ENDO SUITE;  Service: Endoscopy;  Laterality: N/A;  10:45 am   ESOPHAGOGASTRODUODENOSCOPY (EGD) WITH PROPOFOL N/A 12/28/2021   Procedure: ESOPHAGOGASTRODUODENOSCOPY (EGD) WITH PROPOFOL;  Surgeon: Lanelle Bal, DO;  Location: [facility] ENDO SUITE;  Service: Endoscopy;  Laterality: N/A;   FEMORAL-POPLITEAL BYPASS GRAFT  11/06/2005   ILIAC ARTERY STENT  10/03/2011   Right CIA stenting   LEFT HEART CATH AND CORONARY ANGIOGRAPHY N/A 04/14/2019   Procedure: LEFT HEART CATH AND CORONARY ANGIOGRAPHY;  Surgeon: Lennette Bihari, MD;  Location: [facility] INVASIVE CV LAB;  Service: Cardiovascular;  Laterality: N/A;   PERIPHERAL VASCULAR BALLOON ANGIOPLASTY Right 01/16/2017   Procedure: PERIPHERAL VASCULAR BALLOON ANGIOPLASTY;  Surgeon: Fransisco Hertz, MD;  Location: [facility] INVASIVE CV LAB;  Service: Cardiovascular;  Laterality: Right;  common iliac    PERIPHERAL VASCULAR BALLOON ANGIOPLASTY  03/09/2020   Procedure: PERIPHERAL VASCULAR BALLOON ANGIOPLASTY;  Surgeon: Cephus Shelling, MD;  Location: [facility] INVASIVE CV LAB;  Service: Cardiovascular;;   PR VEIN BYPASS GRAFT,AORTO-FEM-POP  06/22/2010   Redo Left Fem-Pop   Repair of left arm fracture     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 30 tablet 11   dexlansoprazole (DEXILANT) 60 MG capsule Take 1 capsule (60 mg total) by mouth daily. 30 capsule 11   diclofenac Sodium (VOLTAREN) 1 % GEL Apply 2 gram  to painful foot and ankle joint twice daily. 150 g 1   glipiZIDE (GLUCOTROL) 5 MG tablet Take 5 mg by mouth daily.     HYDROcodone-acetaminophen (NORCO) 7.5-325 MG per tablet Take 1-2 tablets by mouth 2  (two) times daily as needed for moderate pain.     JANUVIA 100 MG tablet Take 100 mg by mouth daily.     JARDIANCE 25 MG TABS tablet Take 25 mg by mouth daily.     metoprolol succinate (TOPROL XL) 25 MG 24 hr tablet Take 0.5 tablets (12.5 mg total) by mouth daily.     nitroGLYCERIN (NITROSTAT) 0.4 MG SL tablet Place 1 tablet (0.4 mg total) under the tongue every 5 (five) minutes x 3 doses as needed for chest pain (If no relief after 3rd dose, GO TO ER.). 25 tablet 3   Omega-3 Fatty Acids (OMEGA-3 FISH OIL PO) Take 1 capsule by mouth daily.     ONE TOUCH ULTRA TEST test strip      OneTouch Delica Lancets 33G MISC Apply topically.     PROAIR HFA 108 (90 BASE) MCG/ACT inhaler Inhale 2 puffs into the lungs every 6 (six) hours as needed for wheezing or shortness of breath.      sacubitril-valsartan (ENTRESTO) 24-26 MG TAKE 1 TABLET BY MOUTH TWICE DAILY 60 tablet 5  sildenafil (REVATIO) 20 MG tablet Take 40-60 mg by mouth daily as needed for erectile dysfunction.     SYMBICORT 160-4.5 MCG/ACT inhaler INHALE 2 PUFFS FIRST THING IN THE MORNING AND THEN ANOTHER 2 PUFFS 12 HOURS LATER 10.2 g 11   Tiotropium Bromide Monohydrate (SPIRIVA RESPIMAT) 2.5 MCG/ACT AERS INHALE 2 PUFFS INTO THE LUNGS DAILY 4 g 11   XELJANZ 5 MG TABS TAKE 1 TABLET(5 MG) BY MOUTH DAILY 90 tablet 0   No current facility-administered medications for this visit.    Allergies as of 06/20/2022   (No Known Allergies)    Family History  Problem Relation Age of Onset   Heart attack Mother 58   Deep vein thrombosis Mother    Diabetes Mother    Hyperlipidemia Mother    Hypertension Mother    Peripheral vascular disease Mother    Stroke Father    Diabetes Father    Hyperlipidemia Father    Hypertension Father    Peripheral vascular disease Father    Diabetes Sister    Hyperlipidemia Sister    Hypertension Sister    Diabetes Brother    Hyperlipidemia Brother    Cancer Brother    Deep vein thrombosis Brother    Hypertension  Brother    Peripheral vascular disease Brother    Hyperlipidemia Son    Cancer Son    Hypertension Son    Diabetes Daughter    Colon cancer Neg Hx     Social History   Socioeconomic History   Marital status: Married    Spouse name: Not on file   Number of children: Not on file   Years of education: Not on file   Highest education level: Not on file  Occupational History   Not on file  Tobacco Use   Smoking status: Former    Packs/day: 1.50    Years: 45.00    Additional pack years: 0.00    Total pack years: 67.50    Types: Cigarettes, Cigars    Quit date: 09/01/2011    Years since quitting: 10.8    Passive exposure: Never   Smokeless tobacco: Never  Vaping Use   Vaping Use: Never used  Substance and Sexual Activity   Alcohol use: Not Currently   Drug use: No   Sexual activity: Not Currently    Birth control/protection: None  Other Topics Concern   Not on file  Social History Narrative   The patient lives in [place].    Social Determinants of Health   Financial Resource Strain: Not on file  Food Insecurity: Not on file  Transportation Needs: Not on file  Physical Activity: Not on file  Stress: Not on file  Social Connections: Not on file    Subjective: Review of Systems  Constitutional:  Negative for chills and fever.  HENT:  Negative for congestion and hearing loss.   Eyes:  Negative for blurred vision and double vision.  Respiratory:  Negative for cough and shortness of breath.   Cardiovascular:  Negative for chest pain and palpitations.  Gastrointestinal:  Positive for heartburn. Negative for abdominal pain, blood in stool, constipation, diarrhea, melena and vomiting.  Genitourinary:  Negative for dysuria and urgency.  Musculoskeletal:  Negative for joint pain and myalgias.  Skin:  Negative for itching and rash.  Neurological:  Negative for dizziness and headaches.  Psychiatric/Behavioral:  Negative for depression. The patient is not nervous/anxious.       Objective: BP 125/78   Pulse 84   Temp  97.7 F (36.5 C)   Ht 5\' 9"  (1.753 m)   Wt 165 lb 3.2 oz (74.9 kg)   BMI 24.40 kg/m  Physical Exam Constitutional:      Appearance: Normal appearance.  HENT:     Head: Normocephalic and atraumatic.  Eyes:     Extraocular Movements: Extraocular movements intact.     Conjunctiva/sclera: Conjunctivae normal.  Cardiovascular:     Rate and Rhythm: Normal rate and regular rhythm.  Pulmonary:     Effort: Pulmonary effort is normal.     Breath sounds: Normal breath sounds.  Abdominal:     General: Bowel sounds are normal.     Palpations: Abdomen is soft.  Musculoskeletal:        General: Normal range of motion.     Cervical back: Normal range of motion and neck supple.  Skin:    General: Skin is warm.  Neurological:     General: No focal deficit present.     Mental Status: He is alert and oriented to person, place, and time.  Psychiatric:        Mood and Affect: Mood normal.        Behavior: Behavior normal.      Assessment: *Chronic GERD-well-controlled *Cystic mass of ascending colon *H. pylori gastritis- s/p treatment with quad therapy and subsequent negative eradication testing  *Constipation *Left sided abdominal pain  Plan: Chronic GERD well-controlled.  Continue on Dexilant  Etiology of his cystic mass of colon unclear. ?Cystic lymphangioma, fortunately has decreased in size since CT scan in 2019.  Most likely benign. Will continue to monitor.  Discussed case with surgery.  Discussed case with radiology personally.   I will order surveillance CT imaging today to further evaluate.  In regards to his constipation, abdominal pain, likely has a component of irritable bowel syndrome-constipation predominant.  Linzess samples provided today.  Counseled on initial washout.  He understands.  Call with update next week and I will send in formal prescription if improved.  Follow-up in 3 to 4 months.  06/20/2022 9:56  AM   Disclaimer: This note was dictated with voice recognition software. Similar sounding words can inadvertently be transcribed and may not be corrected upon review.

## 2022-06-20 NOTE — Telephone Encounter (Signed)
[facility]  CT A/P scheduled at [facility] for 06/26/22, arrive at 8:15 am to start drinking oral contrast, NPO 4 hours prior

## 2022-06-20 NOTE — Addendum Note (Signed)
Addended by: Elinor Dodge on: 06/20/2022 10:08 AM   Modules accepted: Orders

## 2022-06-25 NOTE — Telephone Encounter (Signed)
Pt left vm returning call.  Advised pt of CT appointment for 06/26/22. Instructions and arrival time. Verbalized understanding.

## 2022-06-26 ENCOUNTER — Ambulatory Visit (HOSPITAL_COMMUNITY)
Admission: RE | Admit: 2022-06-26 | Discharge: 2022-06-26 | Disposition: A | Discharge status: Home / Self Care | Payer: 59 | Source: Ambulatory Visit | Attending: Internal Medicine | Admitting: Internal Medicine

## 2022-06-26 DIAGNOSIS — R1032 Left lower quadrant pain: Secondary | ICD-10-CM | POA: Insufficient documentation

## 2022-06-26 DIAGNOSIS — D490 Neoplasm of unspecified behavior of digestive system: Secondary | ICD-10-CM | POA: Diagnosis not present

## 2022-06-26 DIAGNOSIS — K6389 Other specified diseases of intestine: Secondary | ICD-10-CM | POA: Diagnosis not present

## 2022-06-26 LAB — POCT I-STAT CREATININE: Creatinine, Ser: 0.9 mg/dL (ref 0.61–1.24)

## 2022-06-26 MED ORDER — IOHEXOL 300 MG/ML  SOLN
100.0000 mL | Freq: Once | INTRAMUSCULAR | Status: AC | PRN
  Administered 2022-06-26: 100 mL via INTRAVENOUS

## 2022-06-26 MED ORDER — IOHEXOL 9 MG/ML PO SOLN
ORAL | Status: AC
  Filled 2022-06-26: qty 1000

## 2022-07-01 ENCOUNTER — Telehealth: Payer: Self-pay

## 2022-07-01 NOTE — Telephone Encounter (Signed)
Returned the pt's call and advise of the result note. He stated his daughter did not understand what she was reading. Pt has full understanding of the report now

## 2022-07-03 DIAGNOSIS — J439 Emphysema, unspecified: Secondary | ICD-10-CM | POA: Diagnosis not present

## 2022-07-03 DIAGNOSIS — R6884 Jaw pain: Secondary | ICD-10-CM | POA: Diagnosis not present

## 2022-07-03 DIAGNOSIS — L739 Follicular disorder, unspecified: Secondary | ICD-10-CM | POA: Diagnosis not present

## 2022-07-03 DIAGNOSIS — M069 Rheumatoid arthritis, unspecified: Secondary | ICD-10-CM | POA: Diagnosis not present

## 2022-07-03 DIAGNOSIS — R519 Headache, unspecified: Secondary | ICD-10-CM | POA: Diagnosis not present

## 2022-07-03 DIAGNOSIS — I10 Essential (primary) hypertension: Secondary | ICD-10-CM | POA: Diagnosis not present

## 2022-07-03 DIAGNOSIS — Z87891 Personal history of nicotine dependence: Secondary | ICD-10-CM | POA: Diagnosis not present

## 2022-07-03 DIAGNOSIS — E119 Type 2 diabetes mellitus without complications: Secondary | ICD-10-CM | POA: Diagnosis not present

## 2022-07-04 DIAGNOSIS — Z299 Encounter for prophylactic measures, unspecified: Secondary | ICD-10-CM | POA: Diagnosis not present

## 2022-07-04 DIAGNOSIS — L739 Follicular disorder, unspecified: Secondary | ICD-10-CM | POA: Diagnosis not present

## 2022-07-04 DIAGNOSIS — I10 Essential (primary) hypertension: Secondary | ICD-10-CM | POA: Diagnosis not present

## 2022-07-04 DIAGNOSIS — M792 Neuralgia and neuritis, unspecified: Secondary | ICD-10-CM | POA: Diagnosis not present

## 2022-07-04 NOTE — Progress Notes (Unsigned)
Office Visit Note  Patient: [name]             Date of Birth: 31-May-1950           MRN: [id number]             PCP: Ignatius Specking, MD Referring: Ignatius Specking, MD Visit Date: 07/18/2022 Occupation: @GUAROCC @  Subjective:  Medication monitoring   History of Present Illness: [name] is a 72 y.o. male with history of seronegative rheumatoid arthritis and osteoarthritis.  Patient is taking xeljanz 5 mg 1 tablet by mouth daily. He is tolerating xeljanz without any side effects.  He denies any recent rheumatoid arthritis flares.  He states if he misses several consecutive days of xeljanz he experiences increased pain and inflammation.  He states his symptoms have been stable.  He denies any recent or recurrent infections.    Activities of Daily Living:  Patient reports morning stiffness for 5 minutes.   Patient Denies nocturnal pain.  Difficulty dressing/grooming: Denies Difficulty climbing stairs: Denies Difficulty getting out of chair: Denies Difficulty using hands for taps, buttons, cutlery, and/or writing: Reports  Review of Systems  Constitutional:  Positive for fatigue.  HENT:  Negative for mouth sores and mouth dryness.   Eyes:  Negative for dryness.  Respiratory:  Positive for shortness of breath.   Cardiovascular:  Negative for chest pain and palpitations.  Gastrointestinal:  Negative for blood in stool, constipation and diarrhea.  Endocrine: Positive for increased urination.  Genitourinary:  Negative for involuntary urination.  Musculoskeletal:  Positive for joint pain, joint pain, joint swelling, myalgias, muscle weakness, morning stiffness, muscle tenderness and myalgias. Negative for gait problem.  Skin:  Negative for color change, rash and sensitivity to sunlight.  Allergic/Immunologic: Negative for susceptible to infections.  Neurological:  Negative for dizziness and headaches.  Hematological:  Negative for swollen glands.  Psychiatric/Behavioral:   Negative for depressed mood and sleep disturbance. The patient is not nervous/anxious.     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   Opioid dependence (HCC) 10/15/2018   Other long term (current) drug therapy 10/15/2018   Puncture wound of finger of left hand 08/08/2018   Puncture wound of finger, right, complicated, initial encounter 08/05/2018   DOE (dyspnea on exertion) 11/04/2017   Muscle cramps 07/04/2017   Contracture of left elbow 07/04/2017   High risk medication use 06/23/2017   Primary osteoarthritis of both hands 06/23/2017   Primary osteoarthritis of both feet 06/23/2017   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 factor (HCC) 06/23/2017   DDD (degenerative disc disease), cervical 06/23/2017   Rotator cuff syndrome of right shoulder 06/06/2017   History of  bilateral carpal tunnel release 06/06/2017   Ganglion cyst of wrist, left 06/06/2017   Non-insulin dependent type 2 diabetes mellitus (HCC) 06/06/2017   Chest pain with high risk for cardiac etiology 04/05/2017   COPD with acute exacerbation (HCC) 03/13/2017   Plantar fasciitis 02/21/2017   Iliac artery stenosis, right (HCC) 02/12/2017   Black stool 02/10/2017  Abnormal CT of the abdomen 02/10/2017   Cervical spondylosis 10/11/2016   Bony growth 10/13/2015   Pain in joint, lower leg 03/02/2014   Discoloration of skin-Left dorsum foot 09/28/2013   Swelling of limb-Left Calf / Leg 09/28/2013   PVD (peripheral  vascular disease) (HCC) 10/14/2012   Pain in limb- Left popliteal and calf 10/14/2012   Dyslipidemia, goal LDL below 70    Essential hypertension 03/05/2012   Secondary cardiomyopathy, 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 Jeral Fruit   Collagen vascular disease [facility])    COPD (chronic obstructive pulmonary disease) (HCC)    Coronary atherosclerosis of native coronary artery    Nonobstrtuctive by catheterization 2007, 2013, 2021   DVT (deep venous thrombosis) (HCC)    Essential hypertension    GERD (gastroesophageal reflux disease)    Hyperlipidemia    PAD (peripheral artery disease) (HCC)    s/p left fem-pop bypass 2007; PTA and stenting of right iliac artery 09/2011   Rheumatoid arthritis(714.0)    Type 2 diabetes mellitus (HCC)     Family History  Problem Relation Age of Onset   Heart attack Mother 61   Deep vein thrombosis Mother    Diabetes Mother    Hyperlipidemia Mother    Hypertension Mother    Peripheral vascular disease Mother    Stroke Father    Diabetes Father    Hyperlipidemia Father    Hypertension Father    Peripheral vascular disease Father    Diabetes Sister    Hyperlipidemia Sister    Hypertension Sister    Diabetes Brother    Hyperlipidemia Brother    Cancer Brother    Deep vein thrombosis Brother    Hypertension Brother    Peripheral vascular disease Brother    Hyperlipidemia Son    Cancer Son     Hypertension Son    Diabetes Daughter    Colon cancer Neg Hx    Past Surgical History:  Procedure Laterality Date   ABDOMINAL AORTAGRAM N/A 10/03/2011   Procedure: ABDOMINAL Ronny Flurry;  Surgeon: Fransisco Hertz, MD;  Location: [facility] CATH LAB;  Service: Cardiovascular;  Laterality: N/A;   ABDOMINAL AORTOGRAM W/LOWER EXTREMITY N/A 01/16/2017   Procedure: ABDOMINAL AORTOGRAM W/LOWER EXTREMITY;  Surgeon: Fransisco Hertz, MD;  Location: [facility] INVASIVE CV LAB;  Service: Cardiovascular;  Laterality: N/A;   ABDOMINAL AORTOGRAM W/LOWER EXTREMITY Bilateral 08/04/2019   Procedure: ABDOMINAL AORTOGRAM W/LOWER EXTREMITY;  Surgeon: Cephus Shelling, MD;  Location: [facility] INVASIVE CV LAB;  Service: Cardiovascular;  Laterality: Bilateral;   ABDOMINAL AORTOGRAM W/LOWER EXTREMITY N/A 03/09/2020   Procedure: ABDOMINAL AORTOGRAM W/LOWER EXTREMITY;  Surgeon: Cephus Shelling, MD;  Location: [facility] INVASIVE CV LAB;  Service: Cardiovascular;  Laterality: N/A;   BIOPSY N/A 12/28/2021   Procedure: BIOPSY;  Surgeon: Lanelle Bal, DO;  Location: [facility] ENDO SUITE;  Service: Endoscopy;  Laterality: N/A;   CERVICAL DISC SURGERY     COLONOSCOPY  2013   Hyperplastic polyp   COLONOSCOPY WITH PROPOFOL N/A 02/17/2017   Surgeon: Corbin Ade, MD; Many small and large mouth diverticula in the sigmoid, descending, transverse colon, internal hemorrhoids that were medium sized and grade 1.   COLONOSCOPY WITH PROPOFOL N/A 12/28/2021   Procedure: COLONOSCOPY WITH PROPOFOL;  Surgeon: Lanelle Bal, DO;  Location: [facility] ENDO SUITE;  Service: Endoscopy;  Laterality: N/A;  10:45 am   ESOPHAGOGASTRODUODENOSCOPY (EGD) WITH PROPOFOL N/A 12/28/2021   Procedure: ESOPHAGOGASTRODUODENOSCOPY (EGD) WITH PROPOFOL;  Surgeon: Lanelle Bal, DO;  Location: [facility] ENDO SUITE;  Service: Endoscopy;  Laterality: N/A;   FEMORAL-POPLITEAL BYPASS GRAFT  11/06/2005   ILIAC ARTERY STENT  10/03/2011   Right CIA stenting   LEFT HEART CATH AND CORONARY  ANGIOGRAPHY N/A 04/14/2019   Procedure: LEFT HEART CATH AND CORONARY ANGIOGRAPHY;  Surgeon: Lennette Bihari, MD;  Location: [facility] INVASIVE CV LAB;  Service: Cardiovascular;  Laterality: N/A;   PERIPHERAL VASCULAR BALLOON ANGIOPLASTY Right 01/16/2017   Procedure: PERIPHERAL VASCULAR BALLOON ANGIOPLASTY;  Surgeon: Fransisco Hertz, MD;  Location: [facility] INVASIVE CV LAB;  Service: Cardiovascular;  Laterality: Right;  common iliac    PERIPHERAL VASCULAR BALLOON ANGIOPLASTY  03/09/2020   Procedure: PERIPHERAL VASCULAR BALLOON ANGIOPLASTY;  Surgeon: Cephus Shelling, MD;  Location: [facility] INVASIVE CV LAB;  Service: Cardiovascular;;   PR VEIN BYPASS GRAFT,AORTO-FEM-POP  06/22/2010   Redo Left Fem-Pop   Repair of left arm fracture     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    Immunization History  Administered Date(s) Administered   Influenza, High Dose Seasonal PF 10/14/2016, 09/21/2018   Influenza-Unspecified 11/08/2008, 12/10/2019   Moderna Sars-Covid-2 Vaccination 02/19/2019, 03/22/2019, 11/23/2019   PPD Test 09/22/2007   Td (Adult),5 Lf Tetanus Toxid, Preservative Free 02/09/2001   Tdap 02/09/2001, 08/05/2018     Objective: Vital Signs: BP 115/76 (BP Location: Left Arm, Patient Position: Sitting, Cuff Size: Normal)   Pulse 88   Resp 16   Ht 5\' 9"  (1.753 m)   Wt 164 lb 6.4 oz (74.6 kg)   BMI 24.28 kg/m    Physical Exam Vitals and nursing note reviewed.  Constitutional:      Appearance: He is well-developed.  HENT:     Head: Normocephalic and atraumatic.  Eyes:     Conjunctiva/sclera: Conjunctivae normal.     Pupils: Pupils are equal, round, and reactive to light.  Cardiovascular:     Rate and Rhythm: Normal rate and regular rhythm.     Heart sounds: Normal heart sounds.  Pulmonary:     Effort: Pulmonary effort is normal.     Breath sounds: Normal breath sounds.  Abdominal:      General: Bowel sounds are normal.     Palpations: Abdomen is soft.  Musculoskeletal:     Cervical back: Normal range of motion and neck supple.  Skin:    General: Skin is warm and dry.     Capillary Refill: Capillary refill takes less than 2 seconds.  Neurological:     Mental Status: He is alert and oriented to person, place, and time.  Psychiatric:        Behavior: Behavior normal.      Musculoskeletal Exam: C-spine has limited range of motion with lateral rotation.  Limited abduction of both shoulder joints to about 120 degrees bilaterally.  Flexion contracture noted in  both elbows.  Limited extension of both wrist joints.  PIP and DIP thickening noted.  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  Met extension and PIP joints.  No tenderness or synovitis over MCP joints.  Hip joints have slightly limited range of motion.  Knee joints have good range of motion no warmth or effusion.  Ankle joints have good range of motion with no joint tenderness or synovitis.  Hammertoes bilateral second through fifth.  CDAI Exam: CDAI Score: -- Patient Global: 20 / 100; Provider Global: 20 / 100 Swollen: --; Tender: -- Joint Exam 07/18/2022   No joint exam has been documented for this visit   There is currently no information documented on the homunculus. Go to the Rheumatology activity and complete the homunculus joint exam.  Investigation: No additional findings.  Imaging: CT Abdomen Pelvis W Contrast  Result Date: 06/26/2022 CLINICAL DATA:  Cystic mass of ascending colon, abdominal pain EXAM: CT ABDOMEN AND PELVIS WITH CONTRAST TECHNIQUE: Multidetector CT imaging of the abdomen and pelvis was performed using the standard protocol following bolus administration of intravenous contrast. RADIATION DOSE REDUCTION: This exam was performed according to the departmental dose-optimization program which includes automated exposure control, adjustment of the mA and/or kV according to  patient size and/or use of iterative reconstruction technique. CONTRAST:  OMNIPAQUE IOHEXOL 300 MG/ML  SOLN COMPARISON:  CT scan abdomen and pelvis from 01/04/2022. FINDINGS: Lower chest: The lung bases are clear. No pleural effusion. The heart is normal in size. No pericardial effusion. Hepatobiliary: The liver is normal in size. Non-cirrhotic configuration. No suspicious mass. These is diffuse hepatic steatosis. No intrahepatic or extrahepatic bile duct dilation. No calcified gallstones. Normal gallbladder wall thickness. No pericholecystic inflammatory changes. Pancreas: Unremarkable. No pancreatic ductal dilatation or surrounding inflammatory changes. Spleen: Normal in size. There are subcentimeter calcified granulomas in the spleen. No other focal lesion. Adrenals/Urinary Tract: Adrenal glands are unremarkable. No suspicious renal mass. There are scattered sub 5 mm hypoattenuating foci in bilateral kidneys, too small to adequately characterize but unchanged since the prior study and favored benign in etiology. No hydronephrosis. No renal or ureteric calculi. Unremarkable urinary bladder. Stomach/Bowel: No disproportionate dilation of the small or large bowel loops. No evidence of abnormal bowel wall thickening or inflammatory changes. Redemonstration of an approximately 2.1 x 2.6 cm hypoattenuating lesion in the proximal ascending colon, posteriorly (series 2, image 43). There is no associated solid component or surrounding fat stranding. No proximal bowel obstruction. The lesion appears essentially unchanged since the prior CT scan. The appendix is unremarkable. There are multiple diverticula mainly in the left hemi colon, without imaging signs of diverticulitis. Vascular/Lymphatic: No ascites or pneumoperitoneum. No abdominal or pelvic lymphadenopathy, by size criteria. No aneurysmal dilation of the major abdominal arteries. There are marked peripheral atherosclerotic vascular calcifications of the  aorta and its major branches. Vascular stent noted in the right proximal common iliac artery. There is partially imaged left femoral bypass graft. Reproductive: Normal size prostate. Symmetric seminal vesicles. Other: There is small fat containing left inguinal hernia also containing small focus of calcification within. Musculoskeletal: No suspicious osseous lesions. There are mild multilevel degenerative changes in the visualized spine. IMPRESSION: 1. Essentially stable hypoattenuating lesion in the ascending colon, as described above. 2. No metastatic process identified within the abdomen or pelvis. 3. Multiple other nonacute observations, as described above. Electronically Signed   By: Jules Schick M.D.   On: 06/26/2022 14:03   CT CHEST LUNG CA SCREEN  LOW DOSE W/O CM  Result Date: 06/19/2022 CLINICAL DATA:  72 year old male former smoker (quit 10 years ago) with 68 pack-year history of smoking. Lung cancer screening examination. EXAM: CT CHEST WITHOUT CONTRAST LOW-DOSE FOR LUNG CANCER SCREENING TECHNIQUE: Multidetector CT imaging of the chest was performed following the standard protocol without IV contrast. RADIATION DOSE REDUCTION: This exam was performed according to the departmental dose-optimization program which includes automated exposure control, adjustment of the mA and/or kV according to patient size and/or use of iterative reconstruction technique. COMPARISON:  Chest CT 10/14/2013. FINDINGS: Cardiovascular: Heart size is normal. There is no significant pericardial fluid, thickening or pericardial calcification. There is aortic atherosclerosis, as well as atherosclerosis of the great vessels of the mediastinum and the coronary arteries, including calcified atherosclerotic plaque in the left main, left anterior descending, left circumflex and right coronary arteries. Mild calcifications of the aortic valve. Mediastinum/Nodes: No pathologically enlarged mediastinal or hilar lymph nodes. Please note  that accurate exclusion of hilar adenopathy is limited on noncontrast CT scans. Some densely calcified subcarinal lymph nodes are incidentally noted. Esophagus is unremarkable in appearance. No axillary lymphadenopathy. Lungs/Pleura: Calcified granuloma in the left upper lobe incidentally noted. No other suspicious appearing pulmonary nodules or masses are noted. No acute consolidative airspace disease. No pleural effusions. Mild diffuse bronchial wall thickening with mild centrilobular and paraseptal emphysema. Upper Abdomen: Aortic atherosclerosis. Musculoskeletal: Orthopedic fixation hardware in the lower cervical spine incidentally noted. There are no aggressive appearing lytic or blastic lesions noted in the visualized portions of the skeleton. IMPRESSION: 1. Lung-RADS 1S, negative. Continue annual screening with low-dose chest CT without contrast in 12 months. 2. The "S" modifier above refers to potentially clinically significant non lung cancer related findings. Specifically, there is aortic atherosclerosis, in addition to left main and three-vessel coronary artery disease. Please note that although the presence of coronary artery calcium documents the presence of coronary artery disease, the severity of this disease and any potential stenosis cannot be assessed on this non-gated CT examination. Assessment for potential risk factor modification, dietary therapy or pharmacologic therapy may be warranted, if clinically indicated. 3. Mild diffuse bronchial wall thickening with mild centrilobular and paraseptal emphysema; imaging findings suggestive of underlying COPD. 4. There are calcifications of the aortic valve. Echocardiographic correlation for evaluation of potential valvular dysfunction may be warranted if clinically indicated. Aortic Atherosclerosis (ICD10-I70.0) and Emphysema (ICD10-J43.9). Electronically Signed   By: Trudie Reed M.D.   On: 06/19/2022 12:53   Recent Labs: Lab Results  Component  Value Date   WBC 5.3 03/19/2022   HGB 13.7 03/19/2022   PLT 316 03/19/2022   NA 138 03/19/2022   K 4.1 03/19/2022   CL 104 03/19/2022   CO2 25 03/19/2022   GLUCOSE 141 (H) 03/19/2022   BUN 15 03/19/2022   CREATININE 0.90 06/26/2022   BILITOT 0.4 09/11/2021   ALKPHOS 79 01/25/2021   AST 17 09/11/2021   ALT 12 09/11/2021   PROT 7.4 09/11/2021   ALBUMIN 4.5 01/25/2021   CALCIUM 8.8 (L) 03/19/2022   GFRAA 83 01/27/2020   QFTBGOLDPLUS NEGATIVE 12/11/2021    Speciality Comments: Prior therapy includes: methotrexate ( d/c alcohol use), Arava (d/c patient preference), Orenica (patient declined)  Procedures:  No procedures performed Allergies: Patient has no known allergies.   Assessment / Plan:     Visit Diagnoses: 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 factor (HCC): He has no synovitis on examination today.  He has not had any signs or symptoms of  a rheumatoid arthritis flare recently.  He has clinically been doing well taking Xeljanz 5 mg 1 tablet by mouth daily.  Going to the patient if he misses [name] for several consecutive days he will noticed increased arthralgias and joint stiffness.  Discussed the importance of remaining on Xeljanz as prescribed.  Discussed that he should hold [name] if he develops signs or symptoms of an infection.  Patient remains under the care of of pulmonology, cardiology, and gastroenterology.  He has known history of type 2 diabetes, coronary artery disease, and atherosclerosis.  Patient is aware of the FDA black box warning of MACE if taking a jak inhibitor.  He will remain on low-dose Xeljanz as prescribed.  He was advised to notify us if he develops signs or symptoms of a flare.  He will follow-up in the office in 3 months or sooner if needed.  High risk medication use - Xeljanz 5 mg 1 tablet by mouth daily. Previous therapy: Methotrexate d/c- alcohol use, Orencia declined by patient, treated with [name] initially at [facility].  CBC  and CMP updated on 04/03/22. Orders for CBC and CMP released today.  His next lab work will be due in October and every 3 months.  TB gold negative on 12/11/21.  No recent or recurrent infections. Discussed the importance of holding xeljanz if he develops signs or symptoms of an infection and to resume once the infection has completely cleared.  Counseled patient that Rinvoq is a JAK inhibitor indicated for Rheumatoid Arthritis.  Counseled patient on purpose, proper use, and adverse effects of Xeljanz.  Also reviewed rare adverse effects such as bowel injury and the need to contact us if they develop stomach pain during treatment. Counseled on the increase risk of venous thrombosis. Counseled about FDA black box warning of MACE (major adverse CV events including cardiovascular death, myocardial infarction, and stroke).  - Plan: CBC with Differential/Platelet, COMPLETE METABOLIC PANEL WITH GFR  Rotator cuff syndrome of right shoulder: Limited abduction to about 120 degrees.  Tenderness upon palpation.  Contracture of joint of both elbows: Unchanged.  No tenderness or synovitis noted today.  Primary osteoarthritis of both hands: PIP and DIP thickening consistent with osteoarthritis of both hands.  Limited extension and PIP joints.  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  History of  bilateral carpal tunnel release: Not currently symptomatic.  Chronic pain of left knee: Good range of motion of the left knee joint with no warmth or effusion.  Primary osteoarthritis of both feet: Under the care of Dr. Eloy End.  DDD (degenerative disc disease), cervical: Limited ROM with lateral rotation.   Other medical conditions are listed as follows;   Palpitations - Reviewed cardiology office visit note from 03/04/2022: Metoprolol dose was increased at that time--palpitations have improved.  Mixed hyperlipidemia  Essential hypertension  COPD  GOLD II  Atherosclerosis of native coronary artery of  native heart without angina pectoris  History of diabetes mellitus  PVD (peripheral vascular disease) (HCC)  Orders: Orders Placed This Encounter  Procedures   CBC with Differential/Platelet   COMPLETE METABOLIC PANEL WITH GFR   No orders of the defined types were placed in this encounter.     Follow-Up Instructions: Return in about 3 months (around 10/18/2022) for Rheumatoid arthritis, Osteoarthritis.   Gearldine Bienenstock, PA-C  Note - This record has been created using Dragon software.  Chart creation errors have been sought, but may not always  have been located. Such creation errors do not reflect on  the standard of medical care.

## 2022-07-07 DIAGNOSIS — I10 Essential (primary) hypertension: Secondary | ICD-10-CM | POA: Diagnosis not present

## 2022-07-07 DIAGNOSIS — E1165 Type 2 diabetes mellitus with hyperglycemia: Secondary | ICD-10-CM | POA: Diagnosis not present

## 2022-07-09 ENCOUNTER — Telehealth: Payer: Self-pay

## 2022-07-09 NOTE — Telephone Encounter (Signed)
Pt phoned and advised me that the Linzess samples worked and he wants a Rx sent to his pharmacy. The Linzess did not help with the pain but it helped his constipation. The pt is still complaining of left side pain whenever he lifts something. Pt states he knows something is there. Also I asked the pt was he taking his dexilant and his reply was sometime. I advised the pt to go take his dexilant for now because it was prescribed for him to use daily. Pt is wanting to know what to do about the left side pain. Please advise

## 2022-07-10 ENCOUNTER — Other Ambulatory Visit: Payer: Self-pay

## 2022-07-10 DIAGNOSIS — K5909 Other constipation: Secondary | ICD-10-CM

## 2022-07-10 DIAGNOSIS — K581 Irritable bowel syndrome with constipation: Secondary | ICD-10-CM

## 2022-07-10 MED ORDER — LINACLOTIDE 145 MCG PO CAPS
145.0000 ug | ORAL_CAPSULE | Freq: Every day | ORAL | 2 refills | Status: DC
Start: 2022-07-10 — End: 2022-09-16

## 2022-07-10 NOTE — Telephone Encounter (Signed)
Phoned the pt, but he was not in. LM with spouse to return call. His spouse advised of the dosage of Linzess and where to send it to. Rx sent to [facility] in [place] , [place]: 30 with 2 refills.   Waiting on the pt to return call for instructions for medications and recommendations from Dr Earnest Bailey

## 2022-07-10 NOTE — Telephone Encounter (Signed)
I am unsure what dose of samples we provided with him. Likely 145 mcg. Please confirm what dosage of samples and send in formal prescription to whatever pharmacy he prefers.   As far as his pain, likely this is musculoskeletal in nature based on his symptoms. Can try heating pad to relieve. Try not to overuse (lifting heavy items, etc.)

## 2022-07-12 NOTE — Telephone Encounter (Signed)
Phoned and LMOVM for the pt to return call and that we close at 12 today

## 2022-07-16 NOTE — Telephone Encounter (Signed)
Phoned the pt again, LMOVM for the pt to return call

## 2022-07-16 NOTE — Telephone Encounter (Signed)
Returned the pt's call LMOVM 

## 2022-07-17 ENCOUNTER — Ambulatory Visit: Payer: 59 | Admitting: Podiatry

## 2022-07-17 ENCOUNTER — Encounter: Payer: Self-pay | Admitting: Podiatry

## 2022-07-17 ENCOUNTER — Ambulatory Visit (INDEPENDENT_AMBULATORY_CARE_PROVIDER_SITE_OTHER): Payer: 59 | Admitting: Podiatry

## 2022-07-17 VITALS — BP 141/55

## 2022-07-17 DIAGNOSIS — E1159 Type 2 diabetes mellitus with other circulatory complications: Secondary | ICD-10-CM

## 2022-07-17 DIAGNOSIS — B353 Tinea pedis: Secondary | ICD-10-CM | POA: Diagnosis not present

## 2022-07-17 DIAGNOSIS — B351 Tinea unguium: Secondary | ICD-10-CM | POA: Diagnosis not present

## 2022-07-17 DIAGNOSIS — M79674 Pain in right toe(s): Secondary | ICD-10-CM | POA: Diagnosis not present

## 2022-07-17 DIAGNOSIS — M79675 Pain in left toe(s): Secondary | ICD-10-CM | POA: Diagnosis not present

## 2022-07-17 MED ORDER — KETOCONAZOLE 2 % EX CREA
TOPICAL_CREAM | CUTANEOUS | 1 refills | Status: DC
Start: 2022-07-17 — End: 2022-09-16

## 2022-07-17 NOTE — Telephone Encounter (Signed)
Letter mailed to the pt with instructions.  

## 2022-07-18 ENCOUNTER — Ambulatory Visit: Payer: 59 | Attending: Physician Assistant | Admitting: Physician Assistant

## 2022-07-18 ENCOUNTER — Encounter: Payer: Self-pay | Admitting: Physician Assistant

## 2022-07-18 VITALS — BP 115/76 | HR 88 | Resp 16 | Ht 69.0 in | Wt 164.4 lb

## 2022-07-18 DIAGNOSIS — M19041 Primary osteoarthritis, right hand: Secondary | ICD-10-CM | POA: Diagnosis not present

## 2022-07-18 DIAGNOSIS — G8929 Other chronic pain: Secondary | ICD-10-CM

## 2022-07-18 DIAGNOSIS — M24521 Contracture, right elbow: Secondary | ICD-10-CM | POA: Diagnosis not present

## 2022-07-18 DIAGNOSIS — M75101 Unspecified rotator cuff tear or rupture of right shoulder, not specified as traumatic: Secondary | ICD-10-CM

## 2022-07-18 DIAGNOSIS — M0609 Rheumatoid arthritis without rheumatoid factor, multiple sites: Secondary | ICD-10-CM

## 2022-07-18 DIAGNOSIS — M19042 Primary osteoarthritis, left hand: Secondary | ICD-10-CM

## 2022-07-18 DIAGNOSIS — I739 Peripheral vascular disease, unspecified: Secondary | ICD-10-CM

## 2022-07-18 DIAGNOSIS — R002 Palpitations: Secondary | ICD-10-CM

## 2022-07-18 DIAGNOSIS — Z9889 Other specified postprocedural states: Secondary | ICD-10-CM | POA: Diagnosis not present

## 2022-07-18 DIAGNOSIS — I10 Essential (primary) hypertension: Secondary | ICD-10-CM | POA: Diagnosis not present

## 2022-07-18 DIAGNOSIS — Z79899 Other long term (current) drug therapy: Secondary | ICD-10-CM | POA: Diagnosis not present

## 2022-07-18 DIAGNOSIS — M19072 Primary osteoarthritis, left ankle and foot: Secondary | ICD-10-CM

## 2022-07-18 DIAGNOSIS — M5030 Other cervical disc degeneration, unspecified cervical region: Secondary | ICD-10-CM | POA: Diagnosis not present

## 2022-07-18 DIAGNOSIS — M19071 Primary osteoarthritis, right ankle and foot: Secondary | ICD-10-CM | POA: Diagnosis not present

## 2022-07-18 DIAGNOSIS — I2510 Atherosclerotic heart disease of native coronary artery without angina pectoris: Secondary | ICD-10-CM

## 2022-07-18 DIAGNOSIS — M25562 Pain in left knee: Secondary | ICD-10-CM

## 2022-07-18 DIAGNOSIS — J449 Chronic obstructive pulmonary disease, unspecified: Secondary | ICD-10-CM

## 2022-07-18 DIAGNOSIS — Z8639 Personal history of other endocrine, nutritional and metabolic disease: Secondary | ICD-10-CM

## 2022-07-18 DIAGNOSIS — E782 Mixed hyperlipidemia: Secondary | ICD-10-CM

## 2022-07-18 DIAGNOSIS — M24522 Contracture, left elbow: Secondary | ICD-10-CM

## 2022-07-18 NOTE — Patient Instructions (Signed)
Standing Labs We placed an order today for your standing lab work.   Please have your standing labs drawn in October and every 3 months   Please have your labs drawn 2 weeks prior to your appointment so that the provider can discuss your lab results at your appointment, if possible.  Please note that you may see your imaging and lab results in MyChart before we have reviewed them. We will contact you once all results are reviewed. Please allow our office up to 72 hours to thoroughly review all of the results before contacting the office for clarification of your results.  WALK-IN LAB HOURS  Monday through Thursday from 8:00 am -12:30 pm and 1:00 pm-5:00 pm and Friday from 8:00 am-12:00 pm.  Patients with office visits requiring labs will be seen before walk-in labs.  You may encounter longer than normal wait times. Please allow additional time. Wait times may be shorter on  Monday and Thursday afternoons.  We do not book appointments for walk-in labs. We appreciate your patience and understanding with our staff.   Labs are drawn by [facility]. Please bring your co-pay at the time of your lab draw.  You may receive a bill from [facility] for your lab work.  Please note if you are on Hydroxychloroquine and and an order has been placed for a Hydroxychloroquine level,  you will need to have it drawn 4 hours or more after your last dose.  If you wish to have your labs drawn at another location, please call the office 24 hours in advance so we can fax the orders.  The office is located at [address], [address], [place], [place] [address]   If you have any questions regarding directions or hours of operation,  please call [phone].   As a reminder, please drink plenty of water prior to coming for your lab work. Thanks!  

## 2022-07-19 LAB — COMPLETE METABOLIC PANEL WITH GFR
AG Ratio: 1.6 (calc) (ref 1.0–2.5)
ALT: 15 U/L (ref 9–46)
AST: 16 U/L (ref 10–35)
Albumin: 4.4 g/dL (ref 3.6–5.1)
Alkaline phosphatase (APISO): 73 U/L (ref 35–144)
BUN: 11 mg/dL (ref 7–25)
CO2: 26 mmol/L (ref 20–32)
Calcium: 9.3 mg/dL (ref 8.6–10.3)
Chloride: 107 mmol/L (ref 98–110)
Creat: 0.92 mg/dL (ref 0.70–1.28)
Globulin: 2.8 g/dL (calc) (ref 1.9–3.7)
Glucose, Bld: 193 mg/dL — ABNORMAL HIGH (ref 65–99)
Potassium: 3.9 mmol/L (ref 3.5–5.3)
Sodium: 142 mmol/L (ref 135–146)
Total Bilirubin: 0.4 mg/dL (ref 0.2–1.2)
Total Protein: 7.2 g/dL (ref 6.1–8.1)
eGFR: 89 mL/min/{1.73_m2} (ref >=60)

## 2022-07-19 LAB — CBC WITH DIFFERENTIAL/PLATELET
Absolute Monocytes: 288 cells/uL (ref 200–950)
Basophils Absolute: 29 cells/uL (ref 0–200)
Basophils Relative: 0.6 %
Eosinophils Absolute: 82 cells/uL (ref 15–500)
Eosinophils Relative: 1.7 %
HCT: 42 % (ref 38.5–50.0)
Hemoglobin: 13.3 g/dL (ref 13.2–17.1)
Lymphs Abs: 1507 cells/uL (ref 850–3900)
MCH: 28.1 pg (ref 27.0–33.0)
MCHC: 31.7 g/dL — ABNORMAL LOW (ref 32.0–36.0)
MCV: 88.6 fL (ref 80.0–100.0)
MPV: 9.9 fL (ref 7.5–12.5)
Monocytes Relative: 6 %
Neutro Abs: 2894 cells/uL (ref 1500–7800)
Neutrophils Relative %: 60.3 %
Platelets: 281 10*3/uL (ref 140–400)
RBC: 4.74 10*6/uL (ref 4.20–5.80)
RDW: 15.8 % — ABNORMAL HIGH (ref 11.0–15.0)
Total Lymphocyte: 31.4 %
WBC: 4.8 10*3/uL (ref 3.8–10.8)

## 2022-07-19 NOTE — Progress Notes (Signed)
Glucose is elevated-193. Rest of CMP WNL.  CBC stable.  No medication changes recommended at this time.

## 2022-07-21 NOTE — Progress Notes (Signed)
  Subjective:  Patient ID: [name], male    DOB: [birth year],  MRN: [id number]  [name] presents to clinic today for at risk foot care. Pt has h/o NIDDM with PAD and painful elongated mycotic toenails 1-5 bilaterally which are tender when wearing enclosed shoe gear. Pain is relieved with periodic professional debridement.  Chief Complaint  Patient presents with   Nail Problem     Routine foot care   New problem(s): None.   PCP is Vyas, Herminio Commons B, MD.  No Known Allergies  Review of Systems: Negative except as noted in the HPI.  Objective:  Vitals:   07/17/22 1147  BP: (!) 141/55   [name] is a pleasant 72 y.o. male WD, WN in NAD. AAO x 3.  Neurovascular Examination: Unchanged on today's visit. CFT <4 seconds b/l. DP pulses diminished b/l. PT pulses diminished b/l. Digital hair absent. Skin temperature gradient warm to cool b/l. No ischemia or gangrene. No cyanosis or clubbing noted b/l. No pain with calf compression b/l. Sensation grossly intact b/l with 10 gram monofilament. Vibratory sensation intact b/l. Pt has subjective symptoms of neuropathy.  Dermatological Examination: Pedal skin thin, shiny and atrophic b/l. No open wounds. No interdigital macerations.   Toenails 1-5 b/l thick, discolored, elongated with subungual debris and pain on dorsal palpation.   No hyperkeratotic nor porokeratotic lesions present on today's visit.  Diffuse scaling noted peripherally and plantarly b/l feet.  No interdigital macerations.  No blisters, no weeping. No signs of secondary bacterial infection noted.  Musculoskeletal Examination: Muscle strength 5/5 to b/l LE. No pain, crepitus or joint limitation noted with ROM bilateral LE. HAV with bunion deformity noted b/l LE. Hammertoe deformity noted 2-5 b/l. Patient ambulates independent of any assistive aids.  Radiographs: None  Assessment/Plan: 1. Pain due to onychomycosis of toenails of both feet   2. Tinea pedis of both  feet   3. Type 2 diabetes mellitus with vascular disease (HCC)     Meds ordered this encounter  Medications   ketoconazole (NIZORAL) 2 % cream    Sig: Apply to both feet and between toes once daily for 6 weeks.    Dispense:  60 g    Refill:  1    -Consent given for treatment as described below: -Examined patient. -Continue foot and shoe inspections daily. Monitor blood glucose per PCP/Endocrinologist's recommendations. -Patient to continue soft, supportive shoe gear daily. -Toenails 1-5 b/l were debrided in length and girth with sterile nail nippers and dremel without iatrogenic bleeding.  -For tinea pedis, Rx sent to pharmacy for Ketoconazole Cream 2% to be applied once daily for six weeks. -Patient/POA to call should there be question/concern in the interim.   Return in about 9 weeks (around 09/18/2022).  [name], DPM

## 2022-07-25 DIAGNOSIS — I10 Essential (primary) hypertension: Secondary | ICD-10-CM | POA: Diagnosis not present

## 2022-07-25 DIAGNOSIS — E1165 Type 2 diabetes mellitus with hyperglycemia: Secondary | ICD-10-CM | POA: Diagnosis not present

## 2022-07-25 DIAGNOSIS — Z299 Encounter for prophylactic measures, unspecified: Secondary | ICD-10-CM | POA: Diagnosis not present

## 2022-08-01 ENCOUNTER — Ambulatory Visit: Payer: 59 | Attending: Nurse Practitioner | Admitting: Nurse Practitioner

## 2022-08-01 NOTE — Progress Notes (Deleted)
Cardiology Office Note:    Date:  08/01/2022   ID:  [name], DOB July 05, 1950, MRN [id number]  PCP:  Ignatius Specking, MD   [facility] Providers Cardiologist:  Nona Dell, MD     Referring MD: Ignatius Specking, MD   CC: Here for follow-up  History of Present Illness:    [name] is a very pleasant 72 y.o. male with a hx of the following:   HFmrEF Palpitations, PSVT HLD PAD Carotid artery disease COPD T2DM RA HLD HTN GERD Hx of DVT  Previous cardiovascular history of nonobstructive CAD by cardiac catheterization as seen in 2007, 2013, and 2021.  He is closely followed by [facility] for history of PAD, status post left fem-pop bypass in 2007, with PTCA and stenting of right iliac artery in 2013.  Recent cardiac monitor demonstrated brief episodes of PSVT.  Was started on beta-blocker.  Echocardiogram revealed mildly reduced left ventricular systolic function at 45 to 50%, mild apical septal hypokinesis.  Last seen by Dr. Diona Browner on January 17, 2022.  Was doing well at that time.  Claudication was stable, symptoms noted more on right than left.  Lexiscan Myoview was arranged and revealed findings consistent with inferior wall infarct scar and moderate peri-infarct ischemia, EF measured at 53%.  Patient did not take beta-blocker as instructed from previous office visit, Dr. Diona Browner started Toprol-XL 12.5 mg daily.   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   At last visit, he had started taking metoprolol and heart palpitations have stopped. Admitted to atypical CP.  Refill nitroglycerin and increased metoprolol succinate.  Had ED visit at end of March for vertigo.  Has resolved.  Last saw him 04/16/2022 for follow-up.  Was doing well, however admitted to atypical chest pain, unsure if it was similar to previous office visit.  He had a difficult time describing CP, stated he  "does not have the best memory."  Intermittent and brief, noticed at nighttime, has not happened often.  Described as "jumping/tapping," left-sided, stable over time.  Stable, chronic shortness of breath related to COPD.  Recently saw pulmonologist.   Today he presents for follow-up. Doing well.  Believes his blood pressure is a little on the low side, does admit to occasional feeling of dizziness, however he does have a previous diagnosis of vertigo.  His palpitations and atypical chest pain has improved.  PCP believes he pulled a muscle while exercising, which I am in agreement with.  Denies any exertional chest pain, worsening SHOB, palpitations, syncope, presyncope, dizziness, orthopnea, PND, swelling or significant weight changes, acute bleeding, or claudication.  SH: Very active and exercises every day.   Past Medical History:  Diagnosis Date   Anemia    Bronchitis    Carotid artery disease (HCC)    Cervical spine disease    Neck surgery in 2007 with Dr. Jeral Fruit   Collagen vascular disease [facility])    COPD (chronic obstructive pulmonary disease) (HCC)    Coronary atherosclerosis of native coronary artery    Nonobstrtuctive by catheterization 2007, 2013, 2021   DVT (deep venous thrombosis) (HCC)    Essential hypertension    GERD (gastroesophageal reflux disease)    Hyperlipidemia    PAD (peripheral artery disease) (HCC)    s/p left fem-pop bypass 2007; PTA and stenting of right iliac artery 09/2011   Rheumatoid arthritis(714.0)    Type 2 diabetes mellitus (HCC)  Past Surgical History:  Procedure Laterality Date   ABDOMINAL AORTAGRAM N/A 10/03/2011   Procedure: ABDOMINAL AORTAGRAM;  Surgeon: Fransisco Hertz, MD;  Location: [facility] CATH LAB;  Service: Cardiovascular;  Laterality: N/A;   ABDOMINAL AORTOGRAM W/LOWER EXTREMITY N/A 01/16/2017   Procedure: ABDOMINAL AORTOGRAM W/LOWER EXTREMITY;  Surgeon: Fransisco Hertz, MD;  Location: [facility] INVASIVE CV LAB;  Service: Cardiovascular;  Laterality: N/A;    ABDOMINAL AORTOGRAM W/LOWER EXTREMITY Bilateral 08/04/2019   Procedure: ABDOMINAL AORTOGRAM W/LOWER EXTREMITY;  Surgeon: Cephus Shelling, MD;  Location: [facility] INVASIVE CV LAB;  Service: Cardiovascular;  Laterality: Bilateral;   ABDOMINAL AORTOGRAM W/LOWER EXTREMITY N/A 03/09/2020   Procedure: ABDOMINAL AORTOGRAM W/LOWER EXTREMITY;  Surgeon: Cephus Shelling, MD;  Location: [facility] INVASIVE CV LAB;  Service: Cardiovascular;  Laterality: N/A;   BIOPSY N/A 12/28/2021   Procedure: BIOPSY;  Surgeon: Lanelle Bal, DO;  Location: [facility] ENDO SUITE;  Service: Endoscopy;  Laterality: N/A;   CERVICAL DISC SURGERY     COLONOSCOPY  2013   Hyperplastic polyp   COLONOSCOPY WITH PROPOFOL N/A 02/17/2017   Surgeon: Corbin Ade, MD; Many small and large mouth diverticula in the sigmoid, descending, transverse colon, internal hemorrhoids that were medium sized and grade 1.   COLONOSCOPY WITH PROPOFOL N/A 12/28/2021   Procedure: COLONOSCOPY WITH PROPOFOL;  Surgeon: Lanelle Bal, DO;  Location: [facility] ENDO SUITE;  Service: Endoscopy;  Laterality: N/A;  10:45 am   ESOPHAGOGASTRODUODENOSCOPY (EGD) WITH PROPOFOL N/A 12/28/2021   Procedure: ESOPHAGOGASTRODUODENOSCOPY (EGD) WITH PROPOFOL;  Surgeon: Lanelle Bal, DO;  Location: [facility] ENDO SUITE;  Service: Endoscopy;  Laterality: N/A;   FEMORAL-POPLITEAL BYPASS GRAFT  11/06/2005   ILIAC ARTERY STENT  10/03/2011   Right CIA stenting   LEFT HEART CATH AND CORONARY ANGIOGRAPHY N/A 04/14/2019   Procedure: LEFT HEART CATH AND CORONARY ANGIOGRAPHY;  Surgeon: Lennette Bihari, MD;  Location: [facility] INVASIVE CV LAB;  Service: Cardiovascular;  Laterality: N/A;   PERIPHERAL VASCULAR BALLOON ANGIOPLASTY Right 01/16/2017   Procedure: PERIPHERAL VASCULAR BALLOON ANGIOPLASTY;  Surgeon: Fransisco Hertz, MD;  Location[facility] INVASIVE CV LAB;  Service: Cardiovascular;  Laterality: Right;  common iliac    PERIPHERAL VASCULAR BALLOON ANGIOPLASTY  03/09/2020   Procedure: PERIPHERAL  VASCULAR BALLOON ANGIOPLASTY;  Surgeon: Cephus Shelling, MD;  Location: [facility] INVASIVE CV LAB;  Service: Cardiovascular;;   PR VEIN BYPASS GRAFT,AORTO-FEM-POP  06/22/2010   Redo Left Fem-Pop   Repair of left arm fracture     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   dexlansoprazole (DEXILANT) 60 MG capsule Take 1 capsule (60 mg total) by mouth daily.   diclofenac Sodium (VOLTAREN) 1 % GEL Apply 2 gram  to painful foot and ankle joint twice daily.   glipiZIDE (GLUCOTROL) 5 MG tablet Take 5 mg by mouth daily.   HYDROcodone-acetaminophen (NORCO) 7.5-325 MG per tablet Take 1-2 tablets by mouth 2 (two) times daily as needed for moderate pain.   JANUVIA 100 MG tablet Take 100 mg by mouth daily.   JARDIANCE 25 MG TABS tablet Take 25 mg by mouth daily.   metoprolol succinate (TOPROL XL) 25 MG 24 hr tablet Take 1  tablet (25 mg total) by mouth daily.   nitroGLYCERIN (NITROSTAT) 0.4 MG SL tablet Place 1 tablet (0.4 mg total) under the tongue every 5 (five) minutes x 3 doses as needed for chest pain (If no relief after 3rd dose, GO TO ER.).   Omega-3 Fatty Acids (OMEGA-3 FISH OIL PO) Take 1 capsule by mouth daily.   ONE TOUCH ULTRA TEST test strip    OneTouch Delica Lancets 33G MISC Apply topically.   PROAIR HFA 108 (90 BASE) MCG/ACT inhaler Inhale 2 puffs into the lungs every 6 (six) hours as needed for wheezing or shortness of breath.    sacubitril-valsartan (ENTRESTO) 24-26 MG TAKE 1 TABLET BY MOUTH TWICE DAILY   sildenafil (REVATIO) 20 MG tablet Take 40-60 mg by mouth daily as needed for erectile dysfunction.   SYMBICORT 160-4.5 MCG/ACT inhaler INHALE 2 PUFFS FIRST THING IN THE MORNING AND THEN ANOTHER 2 PUFFS 12  HOURS LATER   Tiotropium Bromide Monohydrate (SPIRIVA RESPIMAT) 2.5 MCG/ACT AERS INHALE 2 PUFFS INTO THE LUNGS DAILY   XELJANZ 5 MG TABS TAKE 1 TABLET(5 MG) BY MOUTH DAILY     Allergies:   Patient has no known allergies.   Social History   Socioeconomic History   Marital status: Married    Spouse name: Not on file   Number of children: Not on file   Years of education: Not on file   Highest education level: Not on file  Occupational History   Not on file  Tobacco Use   Smoking status: Former    Current packs/day: 0.00    Average packs/day: 1.5 packs/day for 45.0 years (67.5 ttl pk-yrs)    Types: Cigarettes, Cigars    Start date: 09/01/1966    Quit date: 09/01/2011    Years since quitting: 10.9    Passive exposure: Never   Smokeless tobacco: Never  Vaping Use   Vaping status: Never Used  Substance and Sexual Activity   Alcohol use: Not Currently   Drug use: No   Sexual activity: Not Currently    Birth control/protection: None  Other Topics Concern   Not on file  Social History Narrative   The patient lives in [place].    Social Determinants of Health   Financial Resource Strain: Not on file  Food Insecurity: Not on file  Transportation Needs: Not on file  Physical Activity: Not on file  Stress: Not on file  Social Connections: Not on file     Family History: The patient's family history includes Cancer in his brother and son; Deep vein thrombosis in his brother and mother; Diabetes in his brother, daughter, father, mother, and sister; Heart attack (age of onset: 51) in his mother; Hyperlipidemia in his brother, father, mother, sister, and son; Hypertension in his brother, father, mother, sister, and son; Peripheral vascular disease in his brother, father, and mother; Stroke in his father. There is no history of Colon cancer.  ROS:   Please see the history of present illness.     All other systems reviewed and are negative.  EKGs/Labs/Other Studies Reviewed:    The  following studies were reviewed today:   EKG:  EKG is not ordered today.    Carotid Doppler 03/2022: Summary:  Right Carotid: Velocities in the right ICA are consistent with a 1-39%  stenosis.   Left Carotid: Velocities in the left ICA are consistent with a 1-39%  stenosis.   Vertebrals: Bilateral vertebral arteries demonstrate antegrade flow.  Subclavians: Normal flow hemodynamics were seen in  bilateral subclavian               arteries.   Myoview on 01/24/2022:    Findings are consistent with prior inferior infarction with mild peri-infarct ischemia. The study is low risk.   No ST deviation was noted.   LV perfusion is abnormal. Moderate size moderate intensity inferior defect with mild reversibility.   Left ventricular function is normal. Nuclear stress EF: 53 %. The left ventricular ejection fraction is normal (55-65%). End diastolic cavity size is normal.      Echocardiogram on 01/10/2022:  1. Left ventricular ejection fraction, by estimation, is 45 to 50%. The  left ventricle has mildly decreased function. The left ventricle  demonstrates regional wall motion abnormalities (see scoring  diagram/findings for description). There is mild  concentric left ventricular hypertrophy. Left ventricular diastolic  parameters are consistent with Grade I diastolic dysfunction (impaired  relaxation).   2. Right ventricular systolic function is normal. The right ventricular  size is normal.   3. Left atrial size was upper normal.   4. The mitral valve is grossly normal. Trivial mitral valve  regurgitation.   5. The aortic valve is tricuspid. There is mild calcification of the  aortic valve. Aortic valve regurgitation is mild. Aortic regurgitation PHT  measures 531 msec. Aortic valve mean gradient measures 8.0 mmHg.   6. Aortic dilatation noted. There is borderline dilatation of the aortic  root, measuring 39 mm.   7. The inferior vena cava is normal in size with greater than 50%   respiratory variability, suggesting right atrial pressure of 3 mmHg.   Comparison(s): Prior images unable to be directly viewed. LVEF reported at  50% at angiography in 2021.  Monitor on 01/01/2022:  Patient had a min HR of 59 bpm (sinus bradycardia), max HR of 203 bpm (1.9 second duration of brief supraventricular tachycardia), and avg HR of 101 bpm (sinus tachycardia). Predominant underlying rhythm was Sinus Rhythm. 6 Supraventricular Tachycardia runs occurred, the run with the fastest interval lasting 5 beats (1.9 second duration) with a max rate of 203 bpm, the longest lasting 19 beats (6.8 seconds) with an avg rate of 163 bpm. Isolated SVEs were occasional (1.4%, 13864), SVE Couplets were rare (<1.0%, 88), and SVE Triplets were rare (<1.0%, 5). Isolated VEs were rare (<1.0%, 3894), VE Couplets were rare (<1.0%, 22), and VE Triplets were rare (<1.0%, 1). Ventricular Bigeminy was present.    Impression: Brief supraventricular tachycardia detected (6 episodes in 7 days; longest duration 6.8 seconds).  Occasional PACs (1.4% burden).   Cardiac cath on 04/14/2019:  Prox LAD to Mid LAD lesion is 20% stenosed. Mid LAD lesion is 30% stenosed. Prox RCA lesion is 30% stenosed.   Mild non-obstructive CAD with 20 and 30% irregularities in the proximal to mid LAD; normal ramus intermediate and left circumflex coronary arteries;  and RCA with smooth 30% proximal narrowing with distal vessel tortuosity.   Low normal global LV function with EF estimated at 50%; LVEDP 14 mmHg.   RECOMMENDATION: Medical therapy for CAD.  Will increase lipid-lowering therapy to atorvastatin 40 mg.  Maintain optimal blood pressure control with target less than 130/80 and ideal blood pressure less than 120/80.  Target LDL less than 70.  Recent Labs: 03/19/2022: Magnesium 2.0 07/18/2022: ALT 15; BUN 11; Creat 0.92; Hemoglobin 13.3; Platelets 281; Potassium 3.9; Sodium 142  Recent Lipid Panel    Component Value Date/Time    CHOL 146 09/11/2021 0937   CHOL 160 11/09/2019 1032  TRIG 56 09/11/2021 0937   HDL 57 09/11/2021 0937   HDL 61 11/09/2019 1032   CHOLHDL 2.6 09/11/2021 0937   VLDL 17 01/25/2021 1118   LDLCALC 75 09/11/2021 0937     Risk Assessment/Calculations:    The 10-year ASCVD risk score (Arnett DK, et al., 2019) is: 25.6%   Values used to calculate the score:     Age: 78 years     Sex: Male     Is Non-Hispanic African American: Yes     Diabetic: Yes     Tobacco smoker: No     Systolic Blood Pressure: 115 mmHg     Is BP treated: Yes     HDL Cholesterol: 57 mg/dL     Total Cholesterol: 146 mg/dL  Physical Exam:    VS:  There were no vitals taken for this visit.    Wt Readings from Last 3 Encounters:  07/18/22 164 lb 6.4 oz (74.6 kg)  06/20/22 165 lb 3.2 oz (74.9 kg)  05/23/22 164 lb 12.8 oz (74.8 kg)     GEN:  Well nourished, well developed in no acute distress HEENT: Normal NECK: No JVD; Right carotid bruit, no left carotid bruit CARDIAC: S1/S2, RRR, no murmurs, rubs, gallops; 2+ pulses RESPIRATORY:  Clear to auscultation without rales, wheezing or rhonchi  MUSCULOSKELETAL:  No edema; No deformity  SKIN: Warm and dry NEUROLOGIC:  Alert and oriented x 3 PSYCHIATRIC:  Normal affect   ASSESSMENT:    No diagnosis found.    PLAN:    In order of problems listed above:  HFmrEF TTE 01/2022 showed EF mildly reduced at 45-50%, RWMA, mild LVH, grade 1 DD. Euvolemic and well compensated on exam. Continue GDMT -BP does not allow room to uptitrate GDMT at this time, will reduce metoprolol to 12.5 mg daily. Low sodium diet, fluid restriction <2L, and daily weights encouraged. Educated to contact our office for weight gain of 2 lbs overnight or 5 lbs in one week. Heart healthy diet and regular cardiovascular exercise encouraged.  If no improvement in blood pressure by next office visit, plan to stop Entresto and switch back to losartan.  Atypical chest pain Improvement since last  visit, points to musculoskeletal in etiology.  Denies any exertional symptoms. NST 01/2022 showed inferior wall infarct scar and moderate per-infarct ischemia. Continue current medication regimen at this time. Recommended Tylenol 1,000 mg BID. No medication changes. Heart healthy diet and regular cardiovascular exercise encouraged. ED precautions discussed.    Palpitations, PSVT Improvement since last OV. Continue medication regimen and will continue to monitor for now. Heart healthy diet and regular cardiovascular exercise encouraged.    HLD, PAD Last LDL 75. He is closely followed by [facility] for history of PAD, status post left fem-pop bypass in 2007, with PTCA and stenting of right iliac artery in 2013.  Recently saw Dr. Chestine Spore and underwent study that revealed patent left common femoral-popliteal artery bypass graft.  Carotid Doppler study as mentioned above.  Continue Pletal, Plavix, and Atorvastatin. Heart healthy diet and regular cardiovascular exercise encouraged.   HTN BP soft.  Will reduce metoprolol in half to 12.5 mg daily.  Continue rest of current medication regimen. Discussed to monitor BP at home at least 2 hours after medications and sitting for 5-10 minutes. Heart healthy diet and regular cardiovascular exercise encouraged.   Aortic root dilatation Borderline dilatation of aortic root noted on prevoius Echo, measuring 39 mm. Plan to update study in 1 year or sooner if needed.  Has upcoming low-dose CT scan scheduled for June 17, 2022.   Disposition: Follow-up with me or APP in 2 to 3 months or sooner if anything changes.       Medication Adjustments/Labs and Tests Ordered: Current medicines are reviewed at length with the patient today.  Concerns regarding medicines are outlined above.  No orders of the defined types were placed in this encounter.  No orders of the defined types were placed in this encounter.   There are no Patient Instructions on file for this visit.    Signed, Sharlene Dory, NP  08/01/2022 8:30 AM    [facility]

## 2022-08-07 DIAGNOSIS — E1165 Type 2 diabetes mellitus with hyperglycemia: Secondary | ICD-10-CM | POA: Diagnosis not present

## 2022-08-07 DIAGNOSIS — I10 Essential (primary) hypertension: Secondary | ICD-10-CM | POA: Diagnosis not present

## 2022-08-12 DIAGNOSIS — M961 Postlaminectomy syndrome, not elsewhere classified: Secondary | ICD-10-CM | POA: Diagnosis not present

## 2022-08-12 DIAGNOSIS — M47812 Spondylosis without myelopathy or radiculopathy, cervical region: Secondary | ICD-10-CM | POA: Diagnosis not present

## 2022-08-22 ENCOUNTER — Telehealth: Payer: Self-pay | Admitting: Nurse Practitioner

## 2022-08-22 DIAGNOSIS — J449 Chronic obstructive pulmonary disease, unspecified: Secondary | ICD-10-CM

## 2022-08-22 MED ORDER — ALBUTEROL SULFATE HFA 108 (90 BASE) MCG/ACT IN AERS
2.0000 | INHALATION_SPRAY | Freq: Four times a day (QID) | RESPIRATORY_TRACT | 2 refills | Status: AC | PRN
Start: 2022-08-22 — End: ?

## 2022-08-22 NOTE — Telephone Encounter (Signed)
08/22/2022 after hours on-call message: Patient contacted the office due to pharmacy not having updated prescription for albuterol rescue inhaler.  Rx resent to pharmacy.  Nothing further needed.  Patient has refills of Symbicort and Spiriva available.

## 2022-08-27 ENCOUNTER — Encounter: Payer: Self-pay | Admitting: Internal Medicine

## 2022-09-06 ENCOUNTER — Other Ambulatory Visit: Payer: Self-pay | Admitting: Physician Assistant

## 2022-09-06 NOTE — Telephone Encounter (Signed)
Last Fill: 06/10/2022  Labs: 07/18/2022 Glucose is elevated-193. Rest of CMP WNL. CBC stable. No medication changes recommended at this time.  TB Gold: 12/11/2021 negative    Next Visit: 10/16/2022  Last Visit: 07/18/2022  [id number] arthritis of multiple sites with negative rheumatoid factor   Current Dose per office note on 07/18/2022: [name] 5 mg 1 tablet by mouth daily.   Okay to refill [name]?

## 2022-09-07 DIAGNOSIS — E1165 Type 2 diabetes mellitus with hyperglycemia: Secondary | ICD-10-CM | POA: Diagnosis not present

## 2022-09-07 DIAGNOSIS — I10 Essential (primary) hypertension: Secondary | ICD-10-CM | POA: Diagnosis not present

## 2022-09-10 DIAGNOSIS — Z299 Encounter for prophylactic measures, unspecified: Secondary | ICD-10-CM | POA: Diagnosis not present

## 2022-09-10 DIAGNOSIS — R5383 Other fatigue: Secondary | ICD-10-CM | POA: Diagnosis not present

## 2022-09-10 DIAGNOSIS — R110 Nausea: Secondary | ICD-10-CM | POA: Diagnosis not present

## 2022-09-10 DIAGNOSIS — W57XXXA Bitten or stung by nonvenomous insect and other nonvenomous arthropods, initial encounter: Secondary | ICD-10-CM | POA: Diagnosis not present

## 2022-09-10 DIAGNOSIS — I10 Essential (primary) hypertension: Secondary | ICD-10-CM | POA: Diagnosis not present
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
## 2022-09-16 ENCOUNTER — Encounter: Payer: Self-pay | Admitting: Gastroenterology

## 2022-09-16 ENCOUNTER — Ambulatory Visit (INDEPENDENT_AMBULATORY_CARE_PROVIDER_SITE_OTHER): Payer: 59 | Admitting: Gastroenterology

## 2022-09-16 VITALS — BP 136/87 | HR 93 | Temp 97.9°F | Ht 69.0 in | Wt 169.2 lb

## 2022-09-16 DIAGNOSIS — D490 Neoplasm of unspecified behavior of digestive system: Secondary | ICD-10-CM

## 2022-09-16 DIAGNOSIS — R1032 Left lower quadrant pain: Secondary | ICD-10-CM | POA: Diagnosis not present

## 2022-09-16 DIAGNOSIS — K5900 Constipation, unspecified: Secondary | ICD-10-CM | POA: Diagnosis not present

## 2022-09-16 DIAGNOSIS — K219 Gastro-esophageal reflux disease without esophagitis: Secondary | ICD-10-CM | POA: Diagnosis not present

## 2022-09-16 MED ORDER — LINACLOTIDE 72 MCG PO CAPS
72.0000 ug | ORAL_CAPSULE | Freq: Every day | ORAL | 3 refills | Status: DC
Start: 2022-09-16 — End: 2022-10-23

## 2022-09-16 NOTE — Patient Instructions (Signed)
Start Linzess 72 mcg daily at least 30 minutes before breakfast.  I have sent a prescription to your pharmacy.  As we discussed, it is common for you to have diarrhea for the first couple of weeks of taking this medication.  Please continue taking this medication daily and the diarrhea should taper off.  Start Benefiber 2 teaspoons daily x 2 weeks, then increase to twice daily.  Follow a high-fiber diet with fruits, vegetables, whole grains.  I suspect your heartburn is related to the spicy chips you have been eating recently.    Follow a GERD diet:  Avoid fried, fatty, greasy, spicy, citrus foods. Avoid caffeine and carbonated beverages. Avoid chocolate. Try eating 4-6 small meals a day rather than 3 large meals. Do not eat within 3 hours of laying down. Prop head of bed up on wood or bricks to create a 6 inch incline.  Continue Dexilant 60 mg daily for now.  If you continue to have heartburn symptoms despite following the above recommendations, please let me know.  I am reaching out to Dr. Marletta Lor regarding surveillance of the cystic right sided colon lesion and will let you know his recommendations.  I will plan to see you back in the office in about 3 months.  Do not hesitate to call sooner if you have questions or concerns.  It was very nice to meet you today!  Ermalinda Memos, PA-C [facility]

## 2022-09-18 ENCOUNTER — Ambulatory Visit: Payer: 59 | Attending: Nurse Practitioner | Admitting: Nurse Practitioner

## 2022-09-18 ENCOUNTER — Ambulatory Visit: Payer: 59 | Admitting: Gastroenterology

## 2022-09-18 VITALS — BP 110/78 | HR 90 | Ht 69.0 in | Wt 166.2 lb

## 2022-09-18 DIAGNOSIS — I739 Peripheral vascular disease, unspecified: Secondary | ICD-10-CM | POA: Diagnosis not present

## 2022-09-18 DIAGNOSIS — E785 Hyperlipidemia, unspecified: Secondary | ICD-10-CM | POA: Diagnosis not present

## 2022-09-18 DIAGNOSIS — I77810 Thoracic aortic ectasia: Secondary | ICD-10-CM

## 2022-09-18 DIAGNOSIS — I10 Essential (primary) hypertension: Secondary | ICD-10-CM | POA: Diagnosis not present

## 2022-09-18 DIAGNOSIS — I5022 Chronic systolic (congestive) heart failure: Secondary | ICD-10-CM | POA: Diagnosis not present

## 2022-09-18 NOTE — Patient Instructions (Signed)

## 2022-09-18 NOTE — Progress Notes (Signed)
Cardiology Office Note:    Date: 09/18/2022  ID:  [name], DOB [birth year], MRN [id number]  PCP:  Ignatius Specking, MD   [facility] Providers Cardiologist:  Nona Dell, MD     Referring MD: Ignatius Specking, MD   CC: Here for follow-up  History of Present Illness:    Lorn Eledge is a very pleasant 72 y.o. male with a PMH of HFmrEF, palpitations/PSVT, HLD, PAD, carotid artery disease, COPD, T2DM, RA, HTN, GERD, and hx of DVT, who presents today for follow-up.   Previous cardiovascular history of nonobstructive CAD by cardiac catheterization as seen in 2007, 2013, and 2021.  He is closely followed by [facility] for history of PAD, status post left fem-pop bypass in 2007, with PTCA and stenting of right iliac artery in 2013.  Recent cardiac monitor demonstrated brief episodes of PSVT.  Was started on beta-blocker.  Echocardiogram revealed mildly reduced left ventricular systolic function at 45 to 50%, mild apical septal hypokinesis.  Last seen by Dr. Diona Browner on January 17, 2022.  Was doing well at that time.  Claudication was stable, symptoms noted more on right than left.  Lexiscan Myoview was arranged and revealed findings consistent with inferior wall infarct scar and moderate peri-infarct ischemia, EF measured at 53%.  Patient did not take beta-blocker as instructed from previous office visit, Dr. Diona Browner started Toprol-XL 12.5 mg daily.   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   I last saw him for follow-up on May 23, 2022. Was doing well.  Believed his blood pressure is a little on the low side, admitted to occasional feeling of dizziness, however he does have a previous diagnosis of vertigo.   Today he presents for follow-up. Doing well. Admits to occasional gas pains, his GI medicine seems to help. Denies any chest pain, shortness of breath, palpitations, syncope, presyncope,  dizziness, orthopnea, PND, swelling or significant weight changes, acute bleeding, or claudication.  SH: Very active and exercises every day.  ROS:   Please see the history of present illness.     All other systems reviewed and are negative.  EKGs/Labs/Other Studies Reviewed:    The following studies were reviewed today:   EKG:  EKG is not ordered today.    Vascular ultrasound bypass graft duplex 05/2022: Left: Patent left common femoral-popliteal artery bypass graft.  Vascular ultrasound aorta/IVC/iliacs Doppler limited 05/2022: See full report under "CV Proc"  ABIs 05/2022: Summary:  Right: Resting right ankle-brachial index indicates noncompressible right  lower extremity arteries. The right toe-brachial index is abnormal.   Left: Resting left ankle-brachial index is within normal range. The left  toe-brachial index is abnormal.  Carotid Doppler 03/2022: Summary:  Right Carotid: Velocities in the right ICA are consistent with a 1-39%  stenosis.   Left Carotid: Velocities in the left ICA are consistent with a 1-39%  stenosis.   Vertebrals: Bilateral vertebral arteries demonstrate antegrade flow.  Subclavians: Normal flow hemodynamics were seen in bilateral subclavian               arteries.   Myoview on 01/24/2022:    Findings are consistent with prior inferior infarction with mild peri-infarct ischemia. The study is low risk.   No ST deviation was noted.   LV perfusion is abnormal. Moderate size moderate intensity inferior defect with mild reversibility.   Left ventricular function is normal. Nuclear stress EF: 53 %. The left ventricular ejection fraction  is normal (55-65%). End diastolic cavity size is normal.      Echocardiogram on 01/10/2022:  1. Left ventricular ejection fraction, by estimation, is 45 to 50%. The  left ventricle has mildly decreased function. The left ventricle  demonstrates regional wall motion abnormalities (see scoring  diagram/findings for  description). There is mild  concentric left ventricular hypertrophy. Left ventricular diastolic  parameters are consistent with Grade I diastolic dysfunction (impaired  relaxation).   2. Right ventricular systolic function is normal. The right ventricular  size is normal.   3. Left atrial size was upper normal.   4. The mitral valve is grossly normal. Trivial mitral valve  regurgitation.   5. The aortic valve is tricuspid. There is mild calcification of the  aortic valve. Aortic valve regurgitation is mild. Aortic regurgitation PHT  measures 531 msec. Aortic valve mean gradient measures 8.0 mmHg.   6. Aortic dilatation noted. There is borderline dilatation of the aortic  root, measuring 39 mm.   7. The inferior vena cava is normal in size with greater than 50%  respiratory variability, suggesting right atrial pressure of 3 mmHg.   Comparison(s): Prior images unable to be directly viewed. LVEF reported at  50% at angiography in 2021.  Monitor on 01/01/2022:  Patient had a min HR of 59 bpm (sinus bradycardia), max HR of 203 bpm (1.9 second duration of brief supraventricular tachycardia), and avg HR of 101 bpm (sinus tachycardia). Predominant underlying rhythm was Sinus Rhythm. 6 Supraventricular Tachycardia runs occurred, the run with the fastest interval lasting 5 beats (1.9 second duration) with a max rate of 203 bpm, the longest lasting 19 beats (6.8 seconds) with an avg rate of 163 bpm. Isolated SVEs were occasional (1.4%, 13864), SVE Couplets were rare (<1.0%, 88), and SVE Triplets were rare (<1.0%, 5). Isolated VEs were rare (<1.0%, 3894), VE Couplets were rare (<1.0%, 22), and VE Triplets were rare (<1.0%, 1). Ventricular Bigeminy was present.    Impression: Brief supraventricular tachycardia detected (6 episodes in 7 days; longest duration 6.8 seconds).  Occasional PACs (1.4% burden).   Cardiac cath on 04/14/2019:  Prox LAD to Mid LAD lesion is 20% stenosed. Mid LAD lesion is 30%  stenosed. Prox RCA lesion is 30% stenosed.   Mild non-obstructive CAD with 20 and 30% irregularities in the proximal to mid LAD; normal ramus intermediate and left circumflex coronary arteries;  and RCA with smooth 30% proximal narrowing with distal vessel tortuosity.   Low normal global LV function with EF estimated at 50%; LVEDP 14 mmHg.   RECOMMENDATION: Medical therapy for CAD.  Will increase lipid-lowering therapy to atorvastatin 40 mg.  Maintain optimal blood pressure control with target less than 130/80 and ideal blood pressure less than 120/80.  Target LDL less than 70.  Risk Assessment/Calculations:    The 10-year ASCVD risk score (Arnett DK, et al., 2019) is: 23.8%   Values used to calculate the score:     Age: 72 years     Sex: Male     Is Non-Hispanic African American: Yes     Diabetic: Yes     Tobacco smoker: No     Systolic Blood Pressure: 110 mmHg     Is BP treated: Yes     HDL Cholesterol: 57 mg/dL     Total Cholesterol: 146 mg/dL  Physical Exam:    VS:  BP 110/78 (BP Location: Right Arm, Cuff Size: Normal)   Pulse 90   Ht 5\' 9"  (1.753 m)  Wt 166 lb 3.2 oz (75.4 kg)   SpO2 96%   BMI 24.54 kg/m     Wt Readings from Last 3 Encounters:  09/18/22 166 lb 3.2 oz (75.4 kg)  09/16/22 169 lb 3.2 oz (76.7 kg)  07/18/22 164 lb 6.4 oz (74.6 kg)     GEN:  Well nourished, well developed in no acute distress HEENT: Normal NECK: No JVD; Right carotid bruit, no left carotid bruit CARDIAC: S1/S2, RRR, no murmurs, rubs, gallops; 2+ pulses RESPIRATORY:  Clear to auscultation without rales, wheezing or rhonchi  MUSCULOSKELETAL:  No edema; No deformity  SKIN: Warm and dry NEUROLOGIC:  Alert and oriented x 3 PSYCHIATRIC:  Normal affect   ASSESSMENT & PLAN:    In order of problems listed above:  HFmrEF TTE 01/2022 showed EF mildly reduced at 45-50%, RWMA, mild LVH, grade 1 DD. Euvolemic and well compensated on exam. Continue GDMT -BP does not allow room to uptitrate  GDMT at this time. Low sodium diet, fluid restriction <2L, and daily weights encouraged. Educated to contact our office for weight gain of 2 lbs overnight or 5 lbs in one week. Heart healthy diet and regular cardiovascular exercise encouraged.  Could not tolerate Entresto.   HLD, PAD Last LDL 83 in May 2024.Marland Kitchen He is closely followed by [facility] for history of PAD, status post left fem-pop bypass in 2007, with PTCA and stenting of right iliac artery in 2013. Continue Pletal, Plavix, and Atorvastatin. Heart healthy diet and regular cardiovascular exercise encouraged.   HTN BP soft.  Continue current medication regimen. Discussed to monitor BP at home at least 2 hours after medications and sitting for 5-10 minutes. Heart healthy diet and regular cardiovascular exercise encouraged.   Aortic root dilatation Borderline dilatation of aortic root noted on previous Echo, measuring 39 mm. Plan to update study in 1 year or sooner if needed.    Disposition: Follow-up with me or APP in 6 months or sooner if anything changes.     Medication Adjustments/Labs and Tests Ordered: Current medicines are reviewed at length with the patient today.  Concerns regarding medicines are outlined above.  No orders of the defined types were placed in this encounter.  No orders of the defined types were placed in this encounter.   Patient Instructions  Medication Instructions:  Your physician recommends that you continue on your current medications as directed. Please refer to the Current Medication list given to you today.  Labwork: None  Testing/Procedures: None  Follow-Up: Your physician recommends that you schedule a follow-up appointment in: 6 Months   Any Other Special Instructions Will Be Listed Below (If Applicable).  If you need a refill on your cardiac medications before your next appointment, please call your pharmacy.   Signed, Sharlene Dory, NP

## 2022-09-22 ENCOUNTER — Telehealth: Payer: Self-pay | Admitting: Gastroenterology

## 2022-09-22 NOTE — Telephone Encounter (Signed)
Courtney: Please let patient know I discussed his case with Dr. Marletta Lor who recommended repeat CT in 1-2-year for surveillance of the cyst in his colon.  We will place him on recall for repeat CT in 1 year.  Darl Pikes: Place patient on recall for CT A/P with contrast in June 2025.

## 2022-09-23 ENCOUNTER — Encounter: Payer: Self-pay | Admitting: *Deleted

## 2022-09-23 DIAGNOSIS — K5900 Constipation, unspecified: Secondary | ICD-10-CM | POA: Diagnosis not present

## 2022-09-23 DIAGNOSIS — Z299 Encounter for prophylactic measures, unspecified: Secondary | ICD-10-CM | POA: Diagnosis not present

## 2022-09-23 DIAGNOSIS — R110 Nausea: Secondary | ICD-10-CM | POA: Diagnosis not present

## 2022-09-23 DIAGNOSIS — I10 Essential (primary) hypertension: Secondary | ICD-10-CM | POA: Diagnosis not present

## 2022-09-23 NOTE — Telephone Encounter (Signed)
On recall

## 2022-09-25 ENCOUNTER — Ambulatory Visit (INDEPENDENT_AMBULATORY_CARE_PROVIDER_SITE_OTHER): Payer: 59 | Admitting: Podiatry

## 2022-09-25 ENCOUNTER — Encounter: Payer: Self-pay | Admitting: Podiatry

## 2022-09-25 DIAGNOSIS — E1159 Type 2 diabetes mellitus with other circulatory complications: Secondary | ICD-10-CM | POA: Diagnosis not present

## 2022-09-25 DIAGNOSIS — M79675 Pain in left toe(s): Secondary | ICD-10-CM

## 2022-09-25 DIAGNOSIS — B351 Tinea unguium: Secondary | ICD-10-CM

## 2022-09-25 DIAGNOSIS — M79674 Pain in right toe(s): Secondary | ICD-10-CM | POA: Diagnosis not present

## 2022-09-25 IMAGING — DX DG CHEST 2V
3 series · 3 of 3 positions shown · non-contrast
Comparison: 05/01/2020.

CLINICAL DATA: Left-sided chest pain.  COPD.

EXAM:
CHEST - 2 VIEW

[chest pa]
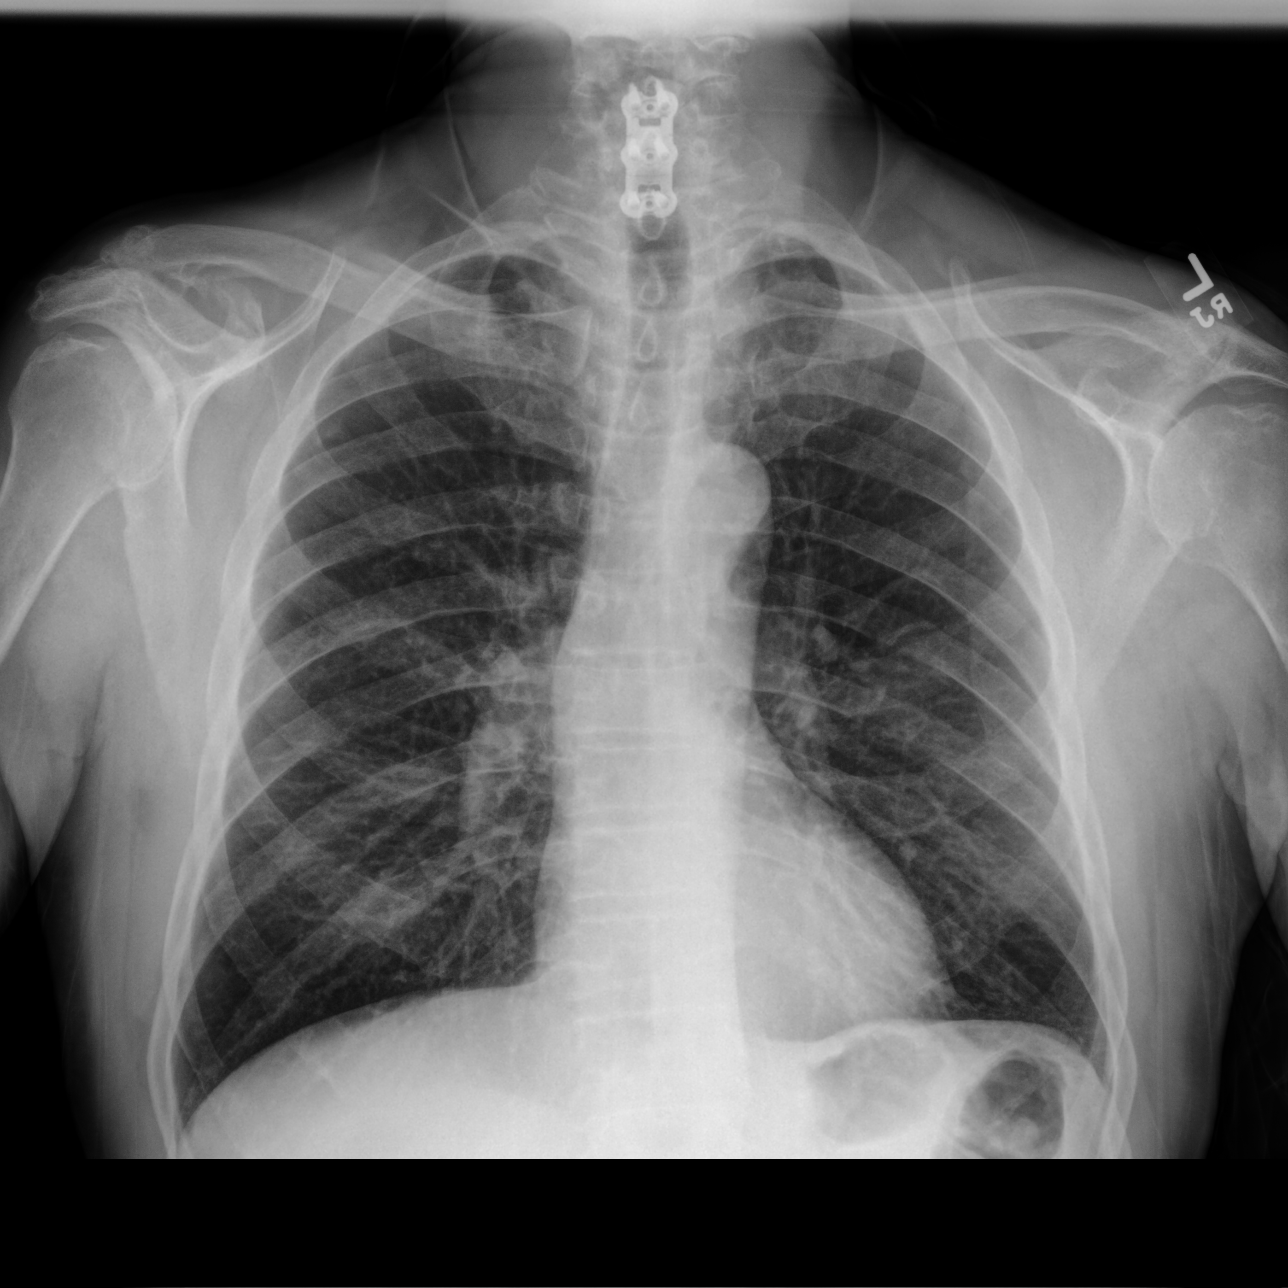

[chest lat (1 of 2)]
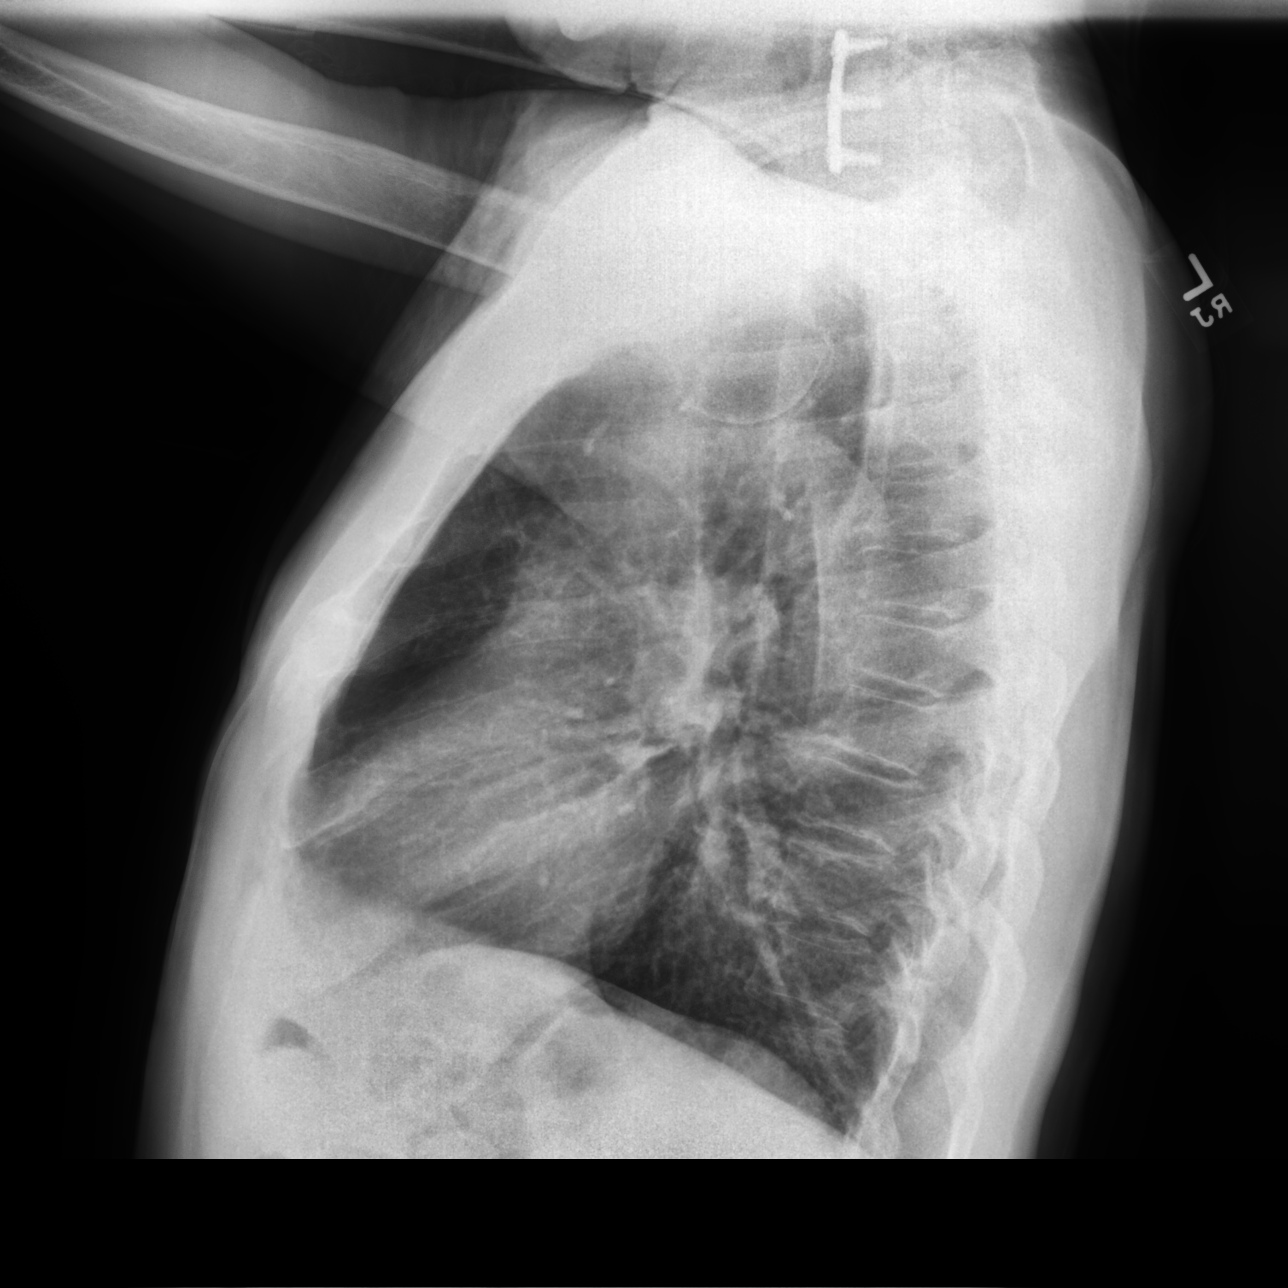

[chest lat (2 of 2)]
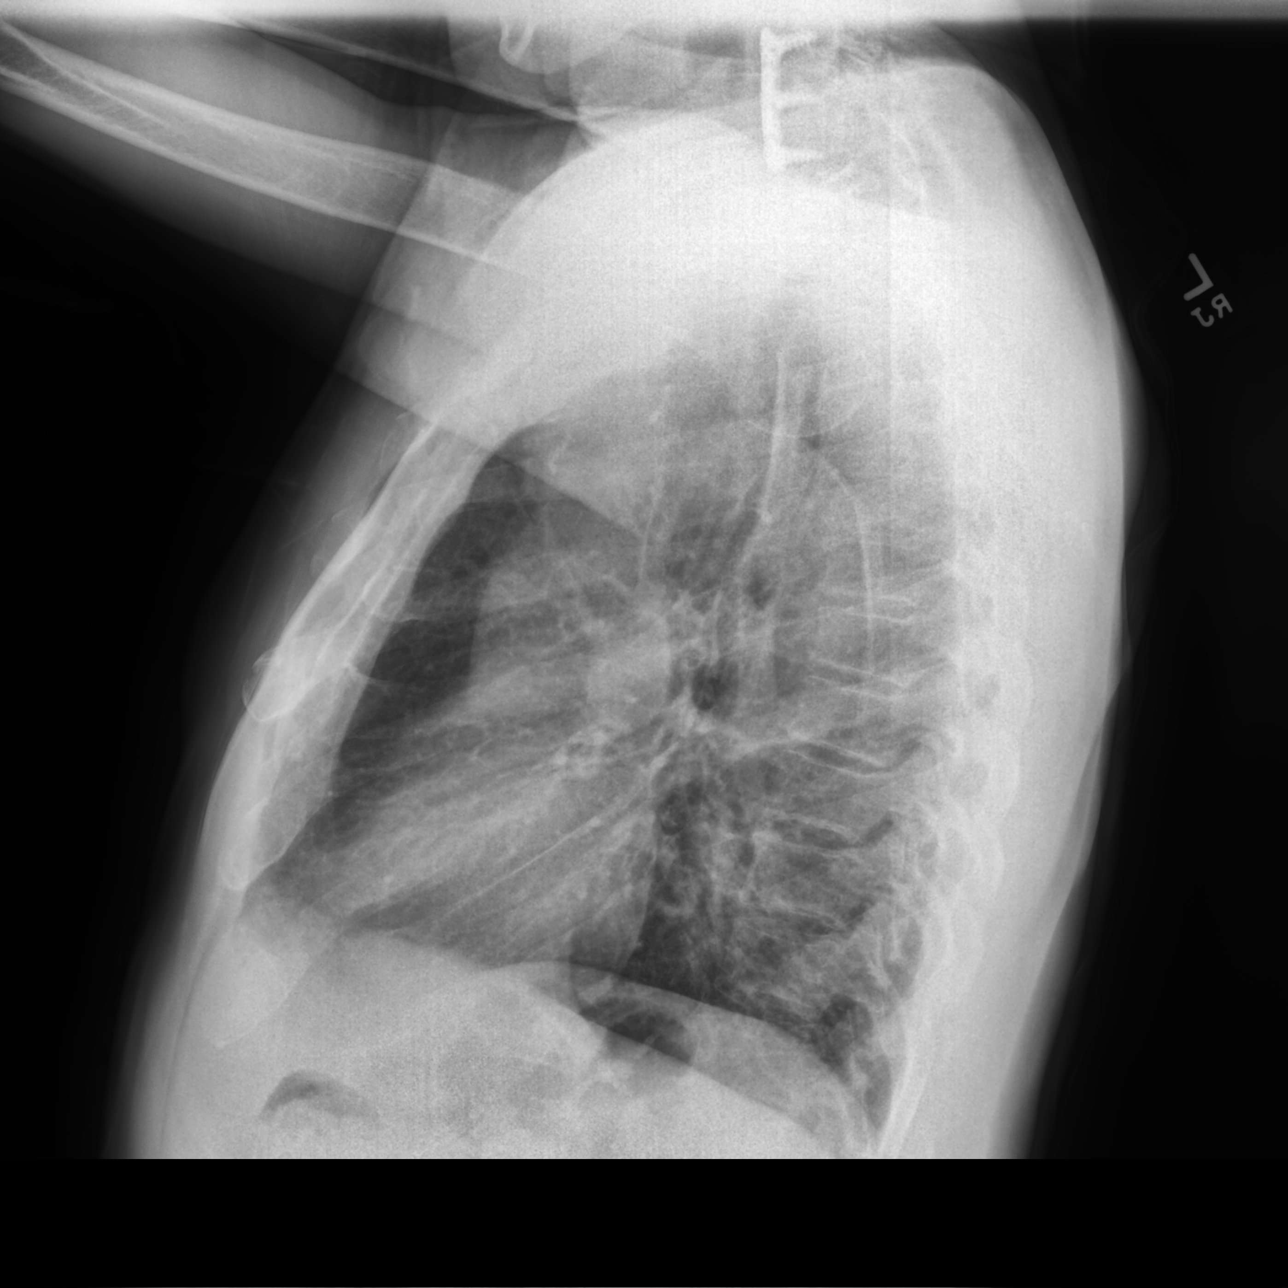

[3 of 3 positions shown; findings below may reference images not displayed]

FINDINGS: Heart size is normal. Mediastinal shadows are normal. Chronic
bilateral pulmonary scarring and upper lobe emphysematous change. No
sign of active infiltrate, mass, effusion or collapse.
IMPRESSION: No active disease. Chronic upper lobe emphysematous change.
Bilateral pulmonary scarring.

## 2022-09-25 NOTE — Telephone Encounter (Signed)
LMOM for pt to call office. Also sent pt a MyChart message.

## 2022-09-29 NOTE — Progress Notes (Signed)
Subjective:  Patient ID: [name], male    DOB: July 25, 1950,  MRN: [id number]  72 y.o. male presents at risk foot care. Pt has h/o NIDDM with PAD and painful thick toenails that are difficult to trim. Pain interferes with ambulation. Aggravating factors include wearing enclosed shoe gear. Pain is relieved with periodic professional debridement.  No chief complaint on file.   New problem(s): None   PCP is Ignatius Specking, MD , and last visit was July 04, 2022.  No Known Allergies  Review of Systems: Negative except as noted in the HPI.   Objective:  [name] is a pleasant 72 y.o. male WD, WN in NAD. AAO x 3.  Vascular Examination: Vascular status intact b/l with nonpalpable pedal pulses. CFT <4 seconds b/l. Pedal hair absent. No edema. No pain with calf compression b/l. Skin temperature gradient WNL b/l. No varicosities noted. No cyanosis or clubbing noted.  Neurological Examination: Sensation grossly intact b/l with 10 gram monofilament. Vibratory sensation intact b/l.  Dermatological Examination: Pedal skin with normal turgor, texture and tone b/l. No open wounds nor interdigital macerations noted. Toenails 1-5 b/l thick, discolored, elongated with subungual debris and pain on dorsal palpation. No hyperkeratotic lesions noted b/l.   Musculoskeletal Examination: Muscle strength 5/5 to b/l LE.  No pain, crepitus noted b/l. No gross pedal deformities. Patient ambulates independently without assistive aids.   Radiographs: None Assessment:   1. Pain due to onychomycosis of toenails of both feet   2. Type 2 diabetes mellitus with vascular disease (HCC)    Plan:  -Patient was evaluated and treated. All patient's and/or POA's questions/concerns answered on today's visit. -Patient has close monitoring by Vascular Surgery.. -Continue supportive shoe gear daily. -Mycotic toenails 1-5 bilaterally were debrided in length and girth with sterile nail nippers and dremel without  incident. -Patient/POA to call should there be question/concern in the interim.  Return in about 9 weeks (around 11/27/2022).  Freddie Breech, DPM

## 2022-10-02 NOTE — Progress Notes (Unsigned)
Office Visit Note  Patient: [name]             Date of Birth: 1950/06/22           MRN: [id number]             PCP: Ignatius Specking, MD Referring: Ignatius Specking, MD Visit Date: 10/16/2022 Occupation: @GUAROCC @  Subjective:  Medication monitoring   History of Present Illness: [name] is a 72 y.o. male seropositive rheumatoid arthritis and osteoarthritis.  Patient remains on Xeljanz 5 mg 1 tablet by mouth daily.  He is tolerating [name] without any side effects and has not had any interruptions in therapy.  Patient states that on Monday he started to have increased arthralgias especially involving the left wrist and left knee joint which he attributes to cooler weather temperatures.  He denies any injury or fall prior to the onset of symptoms.  He has been experiencing some discomfort in the left knee especially at night.  He has been using Voltaren gel topically and states that his pain level has improved this morning.  He continues to have chronic stiffness in both shoulder joints.  He is planning on getting the annual flu shot and COVID-vaccine this week.  He states that he will be having to have oral surgery soon but does not have surgery scheduled yet.     Activities of Daily Living:  Patient reports morning stiffness for 10 minutes.   Patient Reports nocturnal pain.  Difficulty dressing/grooming: Denies Difficulty climbing stairs: Reports Difficulty getting out of chair: Reports Difficulty using hands for taps, buttons, cutlery, and/or writing: Reports  Review of Systems  Constitutional:  Positive for fatigue.  HENT:  Negative for mouth sores and mouth dryness.   Eyes:  Positive for dryness.  Respiratory:  Positive for shortness of breath.   Cardiovascular:  Positive for palpitations. Negative for chest pain.  Gastrointestinal:  Positive for constipation. Negative for blood in stool and diarrhea.  Endocrine: Positive for increased urination.  Genitourinary:   Negative for involuntary urination.  Musculoskeletal:  Positive for joint pain, gait problem, joint pain, joint swelling, myalgias, muscle weakness, morning stiffness and myalgias. Negative for muscle tenderness.  Skin:  Negative for color change, rash, hair loss and sensitivity to sunlight.  Allergic/Immunologic: Negative for susceptible to infections.  Neurological:  Positive for dizziness and headaches.  Hematological:  Negative for swollen glands.  Psychiatric/Behavioral:  Negative for depressed mood and sleep disturbance. The patient is not nervous/anxious.     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   Opioid dependence (HCC) 10/15/2018   Other long term (current) drug therapy 10/15/2018   Puncture wound of finger of left hand 08/08/2018   Puncture wound of finger, right, complicated, initial encounter 08/05/2018   DOE (dyspnea on exertion) 11/04/2017   Muscle cramps 07/04/2017   Contracture of left elbow 07/04/2017   High risk medication use 06/23/2017   Primary osteoarthritis of both hands 06/23/2017  Primary osteoarthritis of both feet 06/23/2017   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 factor (HCC) 06/23/2017   DDD (degenerative disc disease), cervical 06/23/2017   Rotator cuff syndrome of right shoulder 06/06/2017   History of  bilateral carpal tunnel release 06/06/2017   Ganglion  cyst of wrist, left 06/06/2017   Non-insulin dependent type 2 diabetes mellitus (HCC) 06/06/2017   Chest pain with high risk for cardiac etiology 04/05/2017   COPD with acute exacerbation (HCC) 03/13/2017   Plantar fasciitis 02/21/2017   Iliac artery stenosis, right (HCC) 02/12/2017   Black stool 02/10/2017   Abnormal CT of the abdomen 02/10/2017   Cervical spondylosis 10/11/2016   Bony growth 10/13/2015   Pain in joint, lower leg 03/02/2014   Discoloration of skin-Left dorsum foot 09/28/2013   Swelling of limb-Left Calf / Leg 09/28/2013   PVD (peripheral vascular disease) (HCC) 10/14/2012   Pain in limb- Left popliteal and calf 10/14/2012   Dyslipidemia, goal LDL below 70    Essential hypertension 03/05/2012   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 Jeral Fruit   Collagen vascular disease [facility])    COPD (chronic obstructive pulmonary disease) (HCC)    Coronary atherosclerosis of native coronary artery    Nonobstrtuctive by catheterization 2007, 2013, 2021   DVT (deep venous thrombosis) (HCC)    Essential hypertension    GERD (gastroesophageal reflux disease)    Hyperlipidemia    PAD (peripheral artery disease) (HCC)    s/p left fem-pop bypass 2007; PTA and stenting of right iliac artery 09/2011   Rheumatoid arthritis(714.0)    Type 2 diabetes mellitus (HCC)     Family History  Problem Relation Age of  Onset   Heart attack Mother 82   Deep vein thrombosis Mother    Diabetes Mother    Hyperlipidemia Mother    Hypertension Mother    Peripheral vascular disease Mother    Stroke Father    Diabetes Father    Hyperlipidemia Father    Hypertension Father    Peripheral vascular disease Father    Diabetes Sister    Hyperlipidemia Sister    Hypertension Sister    Diabetes Brother    Hyperlipidemia Brother    Cancer Brother    Deep vein thrombosis Brother    Hypertension Brother    Peripheral vascular disease Brother    Hyperlipidemia Son    Cancer Son    Hypertension Son    Diabetes Daughter    Colon cancer Neg Hx    Past Surgical History:  Procedure Laterality Date   ABDOMINAL AORTAGRAM N/A 10/03/2011   Procedure: ABDOMINAL Ronny Flurry;  Surgeon: Fransisco Hertz, MD;  Location[facility] CATH LAB;  Service: Cardiovascular;  Laterality: N/A;   ABDOMINAL AORTOGRAM W/LOWER EXTREMITY N/A 01/16/2017   Procedure: ABDOMINAL AORTOGRAM W/LOWER EXTREMITY;  Surgeon: Fransisco Hertz, MD;  Location: [facility] INVASIVE CV LAB;  Service: Cardiovascular;  Laterality: N/A;  ABDOMINAL AORTOGRAM W/LOWER EXTREMITY Bilateral 08/04/2019   Procedure: ABDOMINAL AORTOGRAM W/LOWER EXTREMITY;  Surgeon: Cephus Shelling, MD;  Location: [facility] INVASIVE CV LAB;  Service: Cardiovascular;  Laterality: Bilateral;   ABDOMINAL AORTOGRAM W/LOWER EXTREMITY N/A 03/09/2020   Procedure: ABDOMINAL AORTOGRAM W/LOWER EXTREMITY;  Surgeon: Cephus Shelling, MD;  Location: [facility] INVASIVE CV LAB;  Service: Cardiovascular;  Laterality: N/A;   BIOPSY N/A 12/28/2021   Procedure: BIOPSY;  Surgeon: Lanelle Bal, DO;  Location: [facility] ENDO SUITE;  Service: Endoscopy;  Laterality: N/A;   CERVICAL DISC SURGERY     COLONOSCOPY  2013   Hyperplastic polyp   COLONOSCOPY WITH PROPOFOL N/A 02/17/2017   Surgeon: Corbin Ade, MD; Many small and large mouth diverticula in the sigmoid, descending, transverse colon, internal hemorrhoids that were medium sized  and grade 1.   COLONOSCOPY WITH PROPOFOL N/A 12/28/2021   Procedure: COLONOSCOPY WITH PROPOFOL;  Surgeon: Lanelle Bal, DO;  Location: [facility] ENDO SUITE;  Service: Endoscopy;  Laterality: N/A;  10:45 am   ESOPHAGOGASTRODUODENOSCOPY (EGD) WITH PROPOFOL N/A 12/28/2021   Procedure: ESOPHAGOGASTRODUODENOSCOPY (EGD) WITH PROPOFOL;  Surgeon: Lanelle Bal, DO;  Location: [facility] ENDO SUITE;  Service: Endoscopy;  Laterality: N/A;   FEMORAL-POPLITEAL BYPASS GRAFT  11/06/2005   ILIAC ARTERY STENT  10/03/2011   Right CIA stenting   LEFT HEART CATH AND CORONARY ANGIOGRAPHY N/A 04/14/2019   Procedure: LEFT HEART CATH AND CORONARY ANGIOGRAPHY;  Surgeon: Lennette Bihari, MD;  Location: [facility] INVASIVE CV LAB;  Service: Cardiovascular;  Laterality: N/A;   PERIPHERAL VASCULAR BALLOON ANGIOPLASTY Right 01/16/2017   Procedure: PERIPHERAL VASCULAR BALLOON ANGIOPLASTY;  Surgeon: Fransisco Hertz, MD;  Location: [facility] INVASIVE CV LAB;  Service: Cardiovascular;  Laterality: Right;  common iliac    PERIPHERAL VASCULAR BALLOON ANGIOPLASTY  03/09/2020   Procedure: PERIPHERAL VASCULAR BALLOON ANGIOPLASTY;  Surgeon: Cephus Shelling, MD;  Location: [facility] INVASIVE CV LAB;  Service: Cardiovascular;;   PR VEIN BYPASS GRAFT,AORTO-FEM-POP  06/22/2010   Redo Left Fem-Pop   Repair of left arm fracture     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    Immunization History  Administered Date(s) Administered   Influenza, High Dose Seasonal PF 10/14/2016, 09/21/2018   Influenza-Unspecified 11/08/2008, 12/10/2019   Moderna Sars-Covid-2 Vaccination 02/19/2019, 03/22/2019, 11/23/2019   PPD Test 09/22/2007   Td (Adult),5 Lf Tetanus Toxid, Preservative Free 02/09/2001   Tdap 02/09/2001, 08/05/2018     Objective: Vital Signs: BP (!) 91/55 (BP Location: Left Arm, Patient Position: Sitting, Cuff Size: Normal)   Pulse 89   Resp 16   Ht 5'  10" (1.778 m)   Wt 170 lb (77.1 kg)   BMI 24.39 kg/m    Physical Exam Vitals and nursing note reviewed.  Constitutional:      Appearance: He is well-developed.  HENT:     Head: Normocephalic and atraumatic.  Eyes:     Conjunctiva/sclera: Conjunctivae normal.     Pupils: Pupils are equal, round, and reactive to light.  Cardiovascular:     Rate and Rhythm: Normal rate and regular rhythm.     Heart sounds: Normal heart sounds.  Pulmonary:     Effort: Pulmonary effort is normal.     Breath sounds: Normal breath sounds.  Abdominal:     General: Bowel sounds are normal.     Palpations: Abdomen is soft.  Musculoskeletal:  Cervical back: Normal range of motion and neck supple.  Skin:    General: Skin is warm and dry.     Capillary Refill: Capillary refill takes less than 2 seconds.  Neurological:     Mental Status: He is alert and oriented to person, place, and time.  Psychiatric:        Behavior: Behavior normal.      Musculoskeletal Exam: C-spine has limited ROM.  Stiffness with range of motion of both shoulders.  Limited abduction of both shoulders to about 120 degrees.  Flexion contractures noted in both elbows but no tenderness along the elbow joint line.  Limited extension of both wrist joints with tenderness of the left wrist.  No tenderness or synovitis over MCP joints.  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  Limited extension of PIP joints.  Hip joints have slightly limited range of motion.  Knee joints have good range of motion with some discomfort in the left knee.  Some tenderness along the medial joint line of the left knee but no warmth or effusion noted.  Ankle joints have good range of motion with no tenderness or joint swelling.  CDAI Exam: CDAI Score: 14  Patient Global: 50 / 100; Provider Global: 50 / 100 Swollen: 0 ; Tender: 4  Joint Exam 10/16/2022      Right  Left  Glenohumeral   Tender   Tender  Wrist      Tender  Knee      Tender      Investigation: No additional findings.  Imaging: No results found.  Recent Labs: Lab Results  Component Value Date   WBC 4.8 07/18/2022   HGB 13.3 07/18/2022   PLT 281 07/18/2022   NA 142 07/18/2022   K 3.9 07/18/2022   CL 107 07/18/2022   CO2 26 07/18/2022   GLUCOSE 193 (H) 07/18/2022   BUN 11 07/18/2022   CREATININE 0.92 07/18/2022   BILITOT 0.4 07/18/2022   ALKPHOS 79 01/25/2021   AST 16 07/18/2022   ALT 15 07/18/2022   PROT 7.2 07/18/2022   ALBUMIN 4.5 01/25/2021   CALCIUM 9.3 07/18/2022   GFRAA 83 01/27/2020   QFTBGOLDPLUS NEGATIVE 12/11/2021    Speciality Comments: Prior therapy includes: methotrexate ( d/c alcohol use), Arava (d/c patient preference), Orenica (patient declined)  Procedures:  No procedures performed Allergies: Patient has no known allergies.    Assessment / Plan:     Visit Diagnoses: 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 factor (HCC): He has no synovitis on examination today.  He continues to experience intermittent arthralgias especially with weather changes.  He uses Voltaren gel topically as needed for pain relief.  He is currently having some increased discomfort in the left wrist and left knee joint since Monday.   No recent injury or fall.  He attributes the increased discomfort to weather changes.  His symptoms have been improving with the use of Voltaren gel.  He is taking Xeljanz 5 mg 1 tablet by mouth daily.  He is tolerating [name] without any side effects.  He has not had any interruptions in therapy.  He was advised to notify us if he develops signs or symptoms of a flare.  He will follow-up in the office in 5 months or sooner if needed.  High risk medication use - Xeljanz 5 mg 1 tablet by mouth daily.  Previous therapy: Methotrexate d/c- alcohol use, Orencia declined by patient, treated with [name] [facility] at [facility].  CBC and CMP were drawn on 07/18/2022. Orders for CBC and CMP released today. TB gold  negative on 12/11/21.  Future order for TB gold placed today.  Discussed the importance of holding [name] if he develops signs or symptoms of infection and to resume once the infection has completely cleared. - Plan: CBC with Differential/Platelet, COMPLETE METABOLIC PANEL WITH GFR, QuantiFERON-TB Gold Plus  Counseled on the increased risk of DVT/PE in patients taking jak inhibitors.   Reviewed with patient that there is the possibility of an increased risk of malignancy but it is not well understood if this increased risk is due to the medication or the disease state.  Counseled that [name] should be held for infection and prior to surgery.  Counseled patient to avoid live vaccines while on [place].  Recommend annual influenza, Pneumovax 23, Prevnar 13, and Shingrix as indicated.   Screening for tuberculosis - Future order for TB gold placed. Plan: QuantiFERON-TB Gold Plus  Rotator cuff syndrome of right shoulder: Limited abduction to about 120 degrees.  Chronic stiffness and intermittent discomfort.    Contracture of joint of both elbows: Flexion contractures unchanged-no tenderness or inflammation noted.   Primary osteoarthritis of both hands: PIP and DIP thickening consistent with osteoarthritis of both hands.  Limited extension of PIP joints. Limited extension of both wrists-tenderness of the left wrist. No synovitis noted.    History of  bilateral carpal tunnel release:  Not currently symptomatic.   Chronic pain of left knee: He experiences intermittent discomfort in the left knee.  He is having acute on chronic pain involving the left knee since Monday.  No recent injury or fall.  No mechanical symptoms.  He has tenderness along the medial joint line of the left knee today.  No warmth or effusion noted on examination today.  He has been applying Voltaren gel topically as needed for pain relief and states that his discomfort and stiffness have improved this morning.  Primary osteoarthritis  of both feet: Under care of Dr. Eloy End.   DDD (degenerative disc disease), cervical: Limited range of motion without rotation.  Other medical conditions are listed as follows:   Palpitations  Mixed hyperlipidemia  Essential hypertension: Blood pressure was 91/55 today in the office.  COPD  GOLD II  Atherosclerosis of native coronary artery of native heart without angina pectoris  History of diabetes mellitus  PVD (peripheral vascular disease) (HCC)    Orders: Orders Placed This Encounter  Procedures   CBC with Differential/Platelet   COMPLETE METABOLIC PANEL WITH GFR   QuantiFERON-TB Gold Plus   No orders of the defined types were placed in this encounter.   Follow-Up Instructions: Return in about 3 months (around 01/16/2023) for Rheumatoid arthritis.   Gearldine Bienenstock, PA-C  Note - This record has been created using Dragon software.  Chart creation errors have been sought, but may not always  have been located. Such creation errors do not reflect on  the standard of medical care.

## 2022-10-07 DIAGNOSIS — I10 Essential (primary) hypertension: Secondary | ICD-10-CM | POA: Diagnosis not present

## 2022-10-07 DIAGNOSIS — E1165 Type 2 diabetes mellitus with hyperglycemia: Secondary | ICD-10-CM | POA: Diagnosis not present

## 2022-10-16 ENCOUNTER — Encounter: Payer: Self-pay | Admitting: Physician Assistant

## 2022-10-16 ENCOUNTER — Ambulatory Visit: Payer: 59 | Attending: Physician Assistant | Admitting: Physician Assistant

## 2022-10-16 VITALS — BP 91/55 | HR 89 | Resp 16 | Ht 70.0 in | Wt 170.0 lb

## 2022-10-16 DIAGNOSIS — I10 Essential (primary) hypertension: Secondary | ICD-10-CM

## 2022-10-16 DIAGNOSIS — I2510 Atherosclerotic heart disease of native coronary artery without angina pectoris: Secondary | ICD-10-CM

## 2022-10-16 DIAGNOSIS — G8929 Other chronic pain: Secondary | ICD-10-CM

## 2022-10-16 DIAGNOSIS — Z9889 Other specified postprocedural states: Secondary | ICD-10-CM

## 2022-10-16 DIAGNOSIS — M19071 Primary osteoarthritis, right ankle and foot: Secondary | ICD-10-CM | POA: Diagnosis not present

## 2022-10-16 DIAGNOSIS — M19041 Primary osteoarthritis, right hand: Secondary | ICD-10-CM | POA: Diagnosis not present

## 2022-10-16 DIAGNOSIS — E782 Mixed hyperlipidemia: Secondary | ICD-10-CM

## 2022-10-16 DIAGNOSIS — Z111 Encounter for screening for respiratory tuberculosis: Secondary | ICD-10-CM

## 2022-10-16 DIAGNOSIS — M25562 Pain in left knee: Secondary | ICD-10-CM

## 2022-10-16 DIAGNOSIS — M5030 Other cervical disc degeneration, unspecified cervical region: Secondary | ICD-10-CM

## 2022-10-16 DIAGNOSIS — M24522 Contracture, left elbow: Secondary | ICD-10-CM

## 2022-10-16 DIAGNOSIS — R002 Palpitations: Secondary | ICD-10-CM | POA: Diagnosis not present

## 2022-10-16 DIAGNOSIS — I739 Peripheral vascular disease, unspecified: Secondary | ICD-10-CM

## 2022-10-16 DIAGNOSIS — M19072 Primary osteoarthritis, left ankle and foot: Secondary | ICD-10-CM

## 2022-10-16 DIAGNOSIS — M24521 Contracture, right elbow: Secondary | ICD-10-CM | POA: Diagnosis not present

## 2022-10-16 DIAGNOSIS — M75101 Unspecified rotator cuff tear or rupture of right shoulder, not specified as traumatic: Secondary | ICD-10-CM

## 2022-10-16 DIAGNOSIS — Z79899 Other long term (current) drug therapy: Secondary | ICD-10-CM | POA: Diagnosis not present

## 2022-10-16 DIAGNOSIS — M0609 Rheumatoid arthritis without rheumatoid factor, multiple sites: Secondary | ICD-10-CM

## 2022-10-16 DIAGNOSIS — M19042 Primary osteoarthritis, left hand: Secondary | ICD-10-CM

## 2022-10-16 DIAGNOSIS — J449 Chronic obstructive pulmonary disease, unspecified: Secondary | ICD-10-CM

## 2022-10-16 DIAGNOSIS — Z8639 Personal history of other endocrine, nutritional and metabolic disease: Secondary | ICD-10-CM

## 2022-10-16 NOTE — Progress Notes (Signed)
Hemoglobin is borderline low.  Rest of CBC stable.  We will continue to monitor.

## 2022-10-16 NOTE — Addendum Note (Signed)
Addended by: Ellen Henri on: 10/16/2022 03:55 PM   Modules accepted: Orders

## 2022-10-17 LAB — CBC WITH DIFFERENTIAL/PLATELET
Absolute Monocytes: 496 {cells}/uL (ref 200–950)
Basophils Absolute: 51 {cells}/uL (ref 0–200)
Basophils Relative: 0.7 %
Eosinophils Absolute: 80 {cells}/uL (ref 15–500)
Eosinophils Relative: 1.1 %
HCT: 40 % (ref 38.5–50.0)
Hemoglobin: 12.7 g/dL — ABNORMAL LOW (ref 13.2–17.1)
Lymphs Abs: 2008 {cells}/uL (ref 850–3900)
MCH: 28.3 pg (ref 27.0–33.0)
MCHC: 31.8 g/dL — ABNORMAL LOW (ref 32.0–36.0)
MCV: 89.1 fL (ref 80.0–100.0)
MPV: 10.9 fL (ref 7.5–12.5)
Monocytes Relative: 6.8 %
Neutro Abs: 4665 {cells}/uL (ref 1500–7800)
Neutrophils Relative %: 63.9 %
Platelets: 284 10*3/uL (ref 140–400)
RBC: 4.49 10*6/uL (ref 4.20–5.80)
RDW: 15.1 % — ABNORMAL HIGH (ref 11.0–15.0)
Total Lymphocyte: 27.5 %
WBC: 7.3 10*3/uL (ref 3.8–10.8)

## 2022-10-17 LAB — COMPLETE METABOLIC PANEL WITH GFR
AG Ratio: 1.6 (calc) (ref 1.0–2.5)
ALT: 18 U/L (ref 9–46)
AST: 18 U/L (ref 10–35)
Albumin: 4.3 g/dL (ref 3.6–5.1)
Alkaline phosphatase (APISO): 74 U/L (ref 35–144)
BUN: 14 mg/dL (ref 7–25)
CO2: 27 mmol/L (ref 20–32)
Calcium: 9.2 mg/dL (ref 8.6–10.3)
Chloride: 105 mmol/L (ref 98–110)
Creat: 1.14 mg/dL (ref 0.70–1.28)
Globulin: 2.7 g/dL (ref 1.9–3.7)
Glucose, Bld: 195 mg/dL — ABNORMAL HIGH (ref 65–99)
Potassium: 4.2 mmol/L (ref 3.5–5.3)
Sodium: 140 mmol/L (ref 135–146)
Total Bilirubin: 0.4 mg/dL (ref 0.2–1.2)
Total Protein: 7 g/dL (ref 6.1–8.1)
eGFR: 69 mL/min/{1.73_m2} (ref >=60)

## 2022-10-17 NOTE — Progress Notes (Signed)
Glucose is 195.  Rest of CMP within normal limits.

## 2022-10-19 ENCOUNTER — Other Ambulatory Visit: Payer: Self-pay | Admitting: Internal Medicine
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
## 2022-10-23 ENCOUNTER — Encounter: Payer: Self-pay | Admitting: Gastroenterology

## 2022-10-23 ENCOUNTER — Ambulatory Visit (INDEPENDENT_AMBULATORY_CARE_PROVIDER_SITE_OTHER): Payer: 59 | Admitting: Gastroenterology

## 2022-10-23 VITALS — BP 132/87 | HR 87 | Temp 97.6°F | Ht 69.0 in | Wt 169.8 lb

## 2022-10-23 DIAGNOSIS — K5909 Other constipation: Secondary | ICD-10-CM

## 2022-10-23 DIAGNOSIS — K5900 Constipation, unspecified: Secondary | ICD-10-CM

## 2022-10-23 DIAGNOSIS — K219 Gastro-esophageal reflux disease without esophagitis: Secondary | ICD-10-CM | POA: Diagnosis not present

## 2022-10-23 DIAGNOSIS — R1013 Epigastric pain: Secondary | ICD-10-CM

## 2022-10-23 NOTE — Patient Instructions (Signed)
Start Trulance 3 mg daily. We are providing you with samples today. Please call next week to let me know how this is working for you.   Monitor for any persistent or worsening upper abdominal pain and let me know if this occurs.   Continue Dexilant 60 mg daily.   I will plan to see you back in 8 weeks or sooner if needed.    Ermalinda Memos, PA-C [facility]

## 2022-10-28 DIAGNOSIS — M961 Postlaminectomy syndrome, not elsewhere classified: Secondary | ICD-10-CM | POA: Diagnosis not present

## 2022-11-06 DIAGNOSIS — E1165 Type 2 diabetes mellitus with hyperglycemia: Secondary | ICD-10-CM | POA: Diagnosis not present

## 2022-11-06 DIAGNOSIS — I10 Essential (primary) hypertension: Secondary | ICD-10-CM | POA: Diagnosis not present

## 2022-11-07 ENCOUNTER — Other Ambulatory Visit: Payer: Self-pay | Admitting: Gastroenterology

## 2022-11-07 ENCOUNTER — Telehealth: Payer: Self-pay | Admitting: *Deleted

## 2022-11-07 DIAGNOSIS — K5904 Chronic idiopathic constipation: Secondary | ICD-10-CM

## 2022-11-07 MED ORDER — TRULANCE 3 MG PO TABS
3.0000 mg | ORAL_TABLET | Freq: Every day | ORAL | 5 refills | Status: AC
Start: 2022-11-07 — End: ?

## 2022-11-07 NOTE — Telephone Encounter (Signed)
Rx sent 

## 2022-11-07 NOTE — Telephone Encounter (Signed)
Pt called and states Trulance is working well for him. He would like a prescription sent to [facility] in [place] with a couple refills.

## 2022-11-11 DIAGNOSIS — Z299 Encounter for prophylactic measures, unspecified: Secondary | ICD-10-CM | POA: Diagnosis not present

## 2022-11-11 DIAGNOSIS — E1165 Type 2 diabetes mellitus with hyperglycemia: Secondary | ICD-10-CM | POA: Diagnosis not present

## 2022-11-11 DIAGNOSIS — I10 Essential (primary) hypertension: Secondary | ICD-10-CM | POA: Diagnosis not present

## 2022-11-11 DIAGNOSIS — K5900 Constipation, unspecified: Secondary | ICD-10-CM | POA: Diagnosis not present

## 2022-11-19 ENCOUNTER — Ambulatory Visit (HOSPITAL_COMMUNITY): Payer: 59

## 2022-11-19 ENCOUNTER — Ambulatory Visit: Payer: 59

## 2022-11-21 DIAGNOSIS — M25512 Pain in left shoulder: Secondary | ICD-10-CM | POA: Diagnosis not present

## 2022-11-21 DIAGNOSIS — Z299 Encounter for prophylactic measures, unspecified: Secondary | ICD-10-CM | POA: Diagnosis not present

## 2022-11-21 DIAGNOSIS — I10 Essential (primary) hypertension: Secondary | ICD-10-CM | POA: Diagnosis not present

## 2022-11-22 ENCOUNTER — Telehealth: Payer: Self-pay

## 2022-11-22 NOTE — Telephone Encounter (Signed)
Caller: Patient  Concern: Constant pain at bypass, between legs, L posterior calf, swelling  Pt denies wounds or discoloration  Location: left leg, right leg  Aggravating Factors: walking  Quality: sharp, shooting, and "feels like heartbeat"  Resolution:  Appts rescheduled earlier  Next Appt: Appointment scheduled for 11/18 and 11/19. Pt aware of two separate dates and times to accommodate all studies.

## 2022-11-25 ENCOUNTER — Ambulatory Visit (INDEPENDENT_AMBULATORY_CARE_PROVIDER_SITE_OTHER)
Admission: RE | Admit: 2022-11-25 | Discharge: 2022-11-25 | Disposition: A | Discharge status: Home / Self Care | Payer: 59 | Source: Ambulatory Visit | Attending: Vascular Surgery | Admitting: Vascular Surgery

## 2022-11-25 ENCOUNTER — Ambulatory Visit (HOSPITAL_COMMUNITY)
Admission: RE | Admit: 2022-11-25 | Discharge: 2022-11-25 | Disposition: A | Discharge status: Home / Self Care | Payer: 59 | Source: Ambulatory Visit | Attending: Vascular Surgery | Admitting: Vascular Surgery

## 2022-11-25 DIAGNOSIS — I70213 Atherosclerosis of native arteries of extremities with intermittent claudication, bilateral legs: Secondary | ICD-10-CM

## 2022-11-25 DIAGNOSIS — I739 Peripheral vascular disease, unspecified: Secondary | ICD-10-CM

## 2022-11-25 DIAGNOSIS — Z95828 Presence of other vascular implants and grafts: Secondary | ICD-10-CM

## 2022-11-25 LAB — VAS US ABI WITH/WO TBI: Left ABI: 1.27

## 2022-11-26 ENCOUNTER — Other Ambulatory Visit: Payer: Self-pay

## 2022-11-26 ENCOUNTER — Ambulatory Visit (INDEPENDENT_AMBULATORY_CARE_PROVIDER_SITE_OTHER): Payer: 59 | Admitting: Physician Assistant

## 2022-11-26 ENCOUNTER — Ambulatory Visit (HOSPITAL_COMMUNITY): Admission: RE | Admit: 2022-11-26 | Payer: 59 | Source: Ambulatory Visit

## 2022-11-26 VITALS — BP 150/83 | HR 86 | Temp 97.6°F | Resp 18 | Ht 69.0 in | Wt 166.7 lb

## 2022-11-26 DIAGNOSIS — Z95828 Presence of other vascular implants and grafts: Secondary | ICD-10-CM | POA: Diagnosis not present

## 2022-11-26 DIAGNOSIS — I739 Peripheral vascular disease, unspecified: Secondary | ICD-10-CM

## 2022-11-26 DIAGNOSIS — T82599A Other mechanical complication of unspecified cardiac and vascular devices and implants, initial encounter: Secondary | ICD-10-CM

## 2022-11-26 NOTE — Progress Notes (Signed)
HISTORY AND PHYSICAL     CC:  follow up. Requesting Provider:  Ignatius Specking, MD  HPI: This is a 72 y.o. male who is here today for follow up for PAD.  Pt has hx of left femoral to popliteal (AK) bypass with PTFE on 11/06/2005 by Dr. Princess Bruins, redo left CFA to AK popliteal bypass with PTFE on 06/22/2010 by Dr. Hart Rochester,  right subintimal angioplasty and stenting of right CIA 10/03/2011 by Dr. Imogene Burn, angiogram with drug coated angioplasty right CIA 01/16/2017 by Dr. Imogene Burn, angiogram with right mid SFA angioplasty with drug coated balloon, right proximal ATA angioplasty 03/09/2020 by Dr. Chestine Spore.   Pt was last seen 05/14/2022 and at that time, he was not having any issues.    He called with concerns for intermittent pain in the right calf and left groin.   The pt returns today for follow up.   He denies any pain in his feet at night or ulcerations.  He states he has intermittent pain in the left groin and right calf.  He is diabetic and last A1c was 7.5, which was improved from 8.5 the time before.    The pt is on a statin for cholesterol management.    The pt is not on an aspirin.    Other AC:  Plavix/Pletal The pt is not on medication for hypertension.  The pt is  on medication for diabetes. Tobacco hx:  former  Pt does not have family hx of AAA.  Past Medical History:  Diagnosis Date   Anemia    Bronchitis    Carotid artery disease (HCC)    Cervical spine disease    Neck surgery in 2007 with Dr. Jeral Fruit   Collagen vascular disease [facility])    COPD (chronic obstructive pulmonary disease) (HCC)    Coronary atherosclerosis of native coronary artery    Nonobstrtuctive by catheterization 2007, 2013, 2021   DVT (deep venous thrombosis) (HCC)    Essential hypertension    GERD (gastroesophageal reflux disease)    Hyperlipidemia    PAD (peripheral artery disease) (HCC)    s/p left fem-pop bypass 2007; PTA and stenting of right iliac artery 09/2011   Rheumatoid arthritis(714.0)    Type 2 diabetes  mellitus (HCC)     Past Surgical History:  Procedure Laterality Date   ABDOMINAL AORTAGRAM N/A 10/03/2011   Procedure: ABDOMINAL Ronny Flurry;  Surgeon: Fransisco Hertz, MD;  Location: [facility] CATH LAB;  Service: Cardiovascular;  Laterality: N/A;   ABDOMINAL AORTOGRAM W/LOWER EXTREMITY N/A 01/16/2017   Procedure: ABDOMINAL AORTOGRAM W/LOWER EXTREMITY;  Surgeon: Fransisco Hertz, MD;  Location: [facility] INVASIVE CV LAB;  Service: Cardiovascular;  Laterality: N/A;   ABDOMINAL AORTOGRAM W/LOWER EXTREMITY Bilateral 08/04/2019   Procedure: ABDOMINAL AORTOGRAM W/LOWER EXTREMITY;  Surgeon: Cephus Shelling, MD;  Location: [facility] INVASIVE CV LAB;  Service: Cardiovascular;  Laterality: Bilateral;   ABDOMINAL AORTOGRAM W/LOWER EXTREMITY N/A 03/09/2020   Procedure: ABDOMINAL AORTOGRAM W/LOWER EXTREMITY;  Surgeon: Cephus Shelling, MD;  Location: [facility] INVASIVE CV LAB;  Service: Cardiovascular;  Laterality: N/A;   BIOPSY N/A 12/28/2021   Procedure: BIOPSY;  Surgeon: Lanelle Bal, DO;  Location: [facility] ENDO SUITE;  Service: Endoscopy;  Laterality: N/A;   CERVICAL DISC SURGERY     COLONOSCOPY  2013   Hyperplastic polyp   COLONOSCOPY WITH PROPOFOL N/A 02/17/2017   Surgeon: Corbin Ade, MD; Many small and large mouth diverticula in the sigmoid, descending, transverse colon, internal hemorrhoids that were medium sized and grade 1.  COLONOSCOPY WITH PROPOFOL N/A 12/28/2021   Procedure: COLONOSCOPY WITH PROPOFOL;  Surgeon: Lanelle Bal, DO;  Location: [facility] ENDO SUITE;  Service: Endoscopy;  Laterality: N/A;  10:45 am   ESOPHAGOGASTRODUODENOSCOPY (EGD) WITH PROPOFOL N/A 12/28/2021   Procedure: ESOPHAGOGASTRODUODENOSCOPY (EGD) WITH PROPOFOL;  Surgeon: Lanelle Bal, DO;  Location: [facility] ENDO SUITE;  Service: Endoscopy;  Laterality: N/A;   FEMORAL-POPLITEAL BYPASS GRAFT  11/06/2005   ILIAC ARTERY STENT  10/03/2011   Right CIA stenting   LEFT HEART CATH AND CORONARY ANGIOGRAPHY N/A 04/14/2019   Procedure: LEFT HEART CATH  AND CORONARY ANGIOGRAPHY;  Surgeon: Lennette Bihari, MD;  Location: [facility] INVASIVE CV LAB;  Service: Cardiovascular;  Laterality: N/A;   PERIPHERAL VASCULAR BALLOON ANGIOPLASTY Right 01/16/2017   Procedure: PERIPHERAL VASCULAR BALLOON ANGIOPLASTY;  Surgeon: Fransisco Hertz, MD;  Location[facility] INVASIVE CV LAB;  Service: Cardiovascular;  Laterality: Right;  common iliac    PERIPHERAL VASCULAR BALLOON ANGIOPLASTY  03/09/2020   Procedure: PERIPHERAL VASCULAR BALLOON ANGIOPLASTY;  Surgeon: Cephus Shelling, MD;  Location: [facility] INVASIVE CV LAB;  Service: Cardiovascular;;   PR VEIN BYPASS GRAFT,AORTO-FEM-POP  06/22/2010   Redo Left Fem-Pop   Repair of left arm fracture     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 8 g 2   atorvastatin (LIPITOR) 80 MG tablet TAKE 1 TABLET(80 MG) BY MOUTH DAILY 90 tablet 3   cilostazol (PLETAL) 100 MG tablet TAKE 1 TABLET(100 MG) BY MOUTH TWICE DAILY 180 tablet 3   clopidogrel (PLAVIX) 75 MG tablet Take 1 tablet (75 mg total) by mouth daily. 30 tablet 11   dexlansoprazole (DEXILANT) 60 MG capsule Take 1 capsule (60 mg total) by mouth daily. 30 capsule 11   diclofenac Sodium (VOLTAREN) 1 % GEL Apply 2 gram  to painful foot and ankle joint twice daily. 150 g 1   glipiZIDE (GLUCOTROL) 5 MG tablet Take 5 mg by mouth daily.     HYDROcodone-acetaminophen (NORCO) 7.5-325 MG per tablet Take 1-2 tablets by mouth 2 (two) times daily as needed for moderate pain.     JANUVIA 100 MG tablet Take 100 mg by mouth daily.     ketoconazole (NIZORAL) 2 % cream Apply topically daily.     LANTUS SOLOSTAR 100 UNIT/ML Solostar Pen Inject into the skin.     metoprolol succinate (TOPROL XL) 25 MG 24 hr tablet Take 0.5 tablets (12.5 mg  total) by mouth daily.     nitroGLYCERIN (NITROSTAT) 0.4 MG SL tablet Place 1 tablet (0.4 mg total) under the tongue every 5 (five) minutes x 3 doses as needed for chest pain (If no relief after 3rd dose, GO TO ER.). 25 tablet 3   Omega-3 Fatty Acids (OMEGA-3 FISH OIL PO) Take 1 capsule by mouth daily.     ONE TOUCH ULTRA TEST test strip      OneTouch Delica Lancets 33G MISC Apply topically.     Plecanatide (TRULANCE) 3 MG TABS Take 1 tablet (3 mg total) by mouth daily. 30 tablet 5   sildenafil (REVATIO) 20 MG tablet Take 40-60 mg by mouth daily as needed for erectile dysfunction.     SPIRIVA RESPIMAT 2.5 MCG/ACT AERS INHALE 2 PUFFS INTO THE  LUNGS DAILY 4 g 11   SYMBICORT 160-4.5 MCG/ACT inhaler Inhale into the lungs.     XELJANZ 5 MG TABS TAKE 1 TABLET(5 MG) BY MOUTH DAILY 90 tablet 0   No current facility-administered medications for this visit.    Family History  Problem Relation Age of Onset   Heart attack Mother 65   Deep vein thrombosis Mother    Diabetes Mother    Hyperlipidemia Mother    Hypertension Mother    Peripheral vascular disease Mother    Stroke Father    Diabetes Father    Hyperlipidemia Father    Hypertension Father    Peripheral vascular disease Father    Diabetes Sister    Hyperlipidemia Sister    Hypertension Sister    Diabetes Brother    Hyperlipidemia Brother    Cancer Brother    Deep vein thrombosis Brother    Hypertension Brother    Peripheral vascular disease Brother    Hyperlipidemia Son    Cancer Son    Hypertension Son    Diabetes Daughter    Colon cancer Neg Hx     Social History   Socioeconomic History   Marital status: Married    Spouse name: Not on file   Number of children: Not on file   Years of education: Not on file   Highest education level: Not on file  Occupational History   Not on file  Tobacco Use   Smoking status: Former    Current packs/day: 0.00    Average packs/day: 1.5 packs/day for 45.0 years (67.5 ttl pk-yrs)     Types: Cigarettes, Cigars    Start date: 09/01/1966    Quit date: 09/01/2011    Years since quitting: 11.2    Passive exposure: Never   Smokeless tobacco: Never  Vaping Use   Vaping status: Never Used  Substance and Sexual Activity   Alcohol use: Not Currently   Drug use: No   Sexual activity: Not Currently    Birth control/protection: None  Other Topics Concern   Not on file  Social History Narrative   The patient lives in [place].    Social Determinants of Health   Financial Resource Strain: Not on file  Food Insecurity: Not on file  Transportation Needs: Not on file  Physical Activity: Not on file  Stress: Not on file  Social Connections: Not on file  Intimate Partner Violence: Not on file     REVIEW OF SYSTEMS:   [X]  denotes positive finding, [ ]  denotes negative finding Cardiac  Comments:  Chest pain or chest pressure:    Shortness of breath upon exertion:    Short of breath when lying flat:    Irregular heart rhythm:        Vascular    Pain in calf, thigh, or hip brought on by ambulation:    Pain in feet at night that wakes you up from your sleep:     Blood clot in your veins:    Leg swelling:         Pulmonary    Oxygen at home:    Productive cough:     Wheezing:         Neurologic    Sudden weakness in arms or legs:     Sudden numbness in arms or legs:     Sudden onset of difficulty speaking or slurred speech:    Temporary loss of vision in one eye:     Problems with dizziness:  Gastrointestinal    Blood in stool:     Vomited blood:         Genitourinary    Burning when urinating:     Blood in urine:        Psychiatric    Major depression:         Hematologic    Bleeding problems:    Problems with blood clotting too easily:        Skin    Rashes or ulcers:        Constitutional    Fever or chills:      PHYSICAL EXAMINATION:  Today's Vitals   11/26/22 0855 11/26/22 0857  BP: (!) 150/83   Pulse: 86   Resp: 18   Temp: 97.6  F (36.4 C)   TempSrc: Temporal   SpO2: 92%   Weight: 166 lb 11.2 oz (75.6 kg)   Height: 5\' 9"  (1.753 m)   PainSc: 8  8   PainLoc: Groin    Body mass index is 24.62 kg/m.   General:  WDWN in NAD; vital signs documented above Gait: Not observed HENT: WNL, normocephalic Pulmonary: normal non-labored breathing , without wheezing Cardiac: regular HR, without carotid bruits Abdomen: soft, NT; aortic pulse is not palpable Skin: without rashes Vascular Exam/Pulses: Palpable femoral and radial pulses bilaterally Monophasic doppler flow bilateral peroneal > DP/PT Extremities: without ischemic changes, without Gangrene , without cellulitis; without open wounds Musculoskeletal: no muscle wasting or atrophy  Neurologic: A&O X 3 Psychiatric:  The pt has Normal affect.   Non-Invasive Vascular Imaging:   ABI's/TBI's on 11/25/2022: Right:  [place]/0.42 - Great toe pressure: 64 Left:  1.27/0.28 - Great toe pressure: 42  Arterial duplex on 11/25/2022: Left Graft #1: Fem- pop bypass  +--------------------+--------+--------+---------+--------+                     PSV cm/sStenosisWaveform Comments  +--------------------+--------+--------+---------+--------+  Inflow             80              triphasic          +--------------------+--------+--------+---------+--------+  Proximal Anastomosis104             triphasic          +--------------------+--------+--------+---------+--------+  Proximal Graft      58              triphasic          +--------------------+--------+--------+---------+--------+  Mid Graft           44              triphasic          +--------------------+--------+--------+---------+--------+  Distal Graft        37              triphasic          +--------------------+--------+--------+---------+--------+  Distal Anastomosis  34              triphasic          +--------------------+--------+--------+---------+--------+  Outflow             37              triphasic          +--------------------+--------+--------+---------+--------+    Abdominal Aorta Findings:  +-------------+-------+----------+----------+---------+--------+--------+  Location    AP (cm)Trans (cm)PSV (cm/s)Waveform ThrombusComments  +-------------+-------+----------+----------+---------+--------+--------+  Proximal  67        triphasic                  +-------------+-------+----------+----------+---------+--------+--------+  Mid         2.05   1.96      42        biphasic                   +-------------+-------+----------+----------+---------+--------+--------+  Distal      1.80   1.88      76        triphasic                  +-------------+-------+----------+----------+---------+--------+--------+  RT CIA Prox  1.4    1.4                 biphasic                   +-------------+-------+----------+----------+---------+--------+--------+  LT CIA Prox                   139       triphasic                  +-------------+-------+----------+----------+---------+--------+--------+  LT CIA Mid                    112       biphasic                   +-------------+-------+----------+----------+---------+--------+--------+  LT CIA Distal                 116       triphasic                  +-------------+-------+----------+----------+---------+--------+--------+  LT EIA Prox                   102       triphasic                  +-------------+-------+----------+----------+---------+--------+--------+  LT EIA Mid                    86        triphasic                  +-------------+-------+----------+----------+---------+--------+--------+  LT EIA Distal                 58        triphasic                  +-------------+-------+----------+----------+---------+--------+--------+   Visualization of the Proximal Abdominal Aorta was limited.    Right Stent(s):  +-------------------------------+--------+--------+---------+--------+  Right common iliac artery stentPSV cm/sStenosisWaveform Comments  +-------------------------------+--------+--------+---------+--------+  Prox to Stent                  76              triphasic          +-------------------------------+--------+--------+---------+--------+  Proximal Stent                 118             triphasic          +-------------------------------+--------+--------+---------+--------+  Mid Stent                      140  triphasic          +-------------------------------+--------+--------+---------+--------+  Distal Stent                   156             triphasic          +-------------------------------+--------+--------+---------+--------+  Distal to Stent                187             triphasic          +-------------------------------+--------+--------+---------+--------+   Summary:  Stenosis: +--------------------+-------------+--------------+  Location            Stenosis     Stent           +--------------------+-------------+--------------+  Distal Aorta        <50% stenosis                +--------------------+-------------+--------------+  Right Common Iliac               1-49% stenosis  +--------------------+-------------+--------------+  Left Common Iliac   <50% stenosis                +--------------------+-------------+--------------+  Right External Iliac<50% stenosis                +--------------------+-------------+--------------+  Left External Iliac <50% stenosis                +--------------------+-------------+--------------+   Previous ABI's/TBI's on 05/14/2022: Right:  [place]/0.53 - Great toe pressure: 69 Left:  1.25/0.31 - Great toe pressure:  41  Previous arterial duplex on 05/14/2022: Left Graft #1: Left common femoral-popliteal artery bypass graft   +--------------------+--------+--------+---------+--------+                     PSV cm/sStenosisWaveform Comments  +--------------------+--------+--------+---------+--------+  Inflow             78              triphasic          +--------------------+--------+--------+---------+--------+  Proximal Anastomosis89              triphasic          +--------------------+--------+--------+---------+--------+  Proximal Graft      68              triphasic          +--------------------+--------+--------+---------+--------+  Mid Graft           53              triphasic          +--------------------+--------+--------+---------+--------+  Distal Graft        66              triphasic          +--------------------+--------+--------+---------+--------+  Distal Anastomosis  104             triphasic          +--------------------+--------+--------+---------+--------+  Outflow            55              triphasic          +--------------------+--------+--------+---------+--------+     ASSESSMENT/PLAN:: 72 y.o. male here for follow up for PAD with hx of left femoral to popliteal (AK) bypass with PTFE on 11/06/2005 by Dr. Princess Bruins, redo left CFA to AK popliteal bypass with PTFE on 06/22/2010 by Dr. Hart Rochester,  right subintimal angioplasty and stenting of right CIA 10/03/2011 by Dr. Imogene Burn, angiogram with drug coated angioplasty right CIA 01/16/2017 by Dr. Imogene Burn, angiogram with right mid SFA angioplasty with drug coated balloon, right proximal ATA angioplasty 03/09/2020 by Dr. Chestine Spore.     -pt with monophasic doppler flow bilateral feet with peroneal > DP/PT bilaterally -his duplex of his left leg bypass shows decreased velocities, which can be indicative of a threatened bypass.  Discussed proceeding with angiography.  He is planning to go to [place] for Thanksgiving.  Will set him up for the week he returns.  Dr. Chestine Spore did his last angiogram, however, he is out for the  next couple of weeks so will have one of his partners proceed.  -continue plavix/statin -he did have a carotid duplex in March 2024 that revealed 1-39% bilateral ICA stenosis.    Doreatha Massed, [facility] [phone]  Clinic MD:   Karin Lieu

## 2022-11-27 ENCOUNTER — Ambulatory Visit: Payer: 59 | Admitting: Podiatry

## 2022-11-29 ENCOUNTER — Telehealth: Payer: Self-pay

## 2022-11-29 NOTE — Addendum Note (Signed)
Addended by: Primitivo Gauze on: 11/29/2022 12:27 PM   Modules accepted: Orders

## 2022-11-29 NOTE — Telephone Encounter (Signed)
Spoke with patient regarding triage call and he requested to move up aortogram. Patient was made aware that procedure will still not be with Dr. Chestine Spore and wished to proceed. Moved procedure date to 11/25, pending insurance approval. Patient voiced understanding.

## 2022-11-29 NOTE — Telephone Encounter (Signed)
Pt walked into office, triage form filled out, form given to triage RN to call pt.  Caller: Patient  Concern: Pt having increased leg pain, especially during the HS.  Pt wanted to know if he should go to [place] or not.  Location: left leg, right leg  Description:  significantly worse last HS  Aggravating Factors: lying down  Quality: cramping  Treatments:  Informed pt that IF he goes on trip, he should take 10-15 min break every hour during the drive, wear compressions, walk as much as he currently is doing, and elevate for any swelling.   Resolution: Instructed patient to proceed to nearest ER if he develops any ischemic symptoms  Next Appt: Pt considering not going on trip and potentially moving AGM date earlier, if possible. Georgianne Fick, RN informed.

## 2022-12-02 ENCOUNTER — Encounter (HOSPITAL_COMMUNITY)
Admission: RE | Disposition: A | Discharge status: Home / Self Care | Payer: Self-pay | Source: Home / Self Care | Attending: Vascular Surgery

## 2022-12-02 ENCOUNTER — Other Ambulatory Visit: Payer: Self-pay

## 2022-12-02 ENCOUNTER — Ambulatory Visit (HOSPITAL_COMMUNITY)
Admission: RE | Admit: 2022-12-02 | Discharge: 2022-12-02 | Disposition: A | Discharge status: Home / Self Care | Payer: 59 | Attending: Vascular Surgery | Admitting: Vascular Surgery

## 2022-12-02 DIAGNOSIS — I10 Essential (primary) hypertension: Secondary | ICD-10-CM | POA: Diagnosis not present

## 2022-12-02 DIAGNOSIS — I70203 Unspecified atherosclerosis of native arteries of extremities, bilateral legs: Secondary | ICD-10-CM

## 2022-12-02 DIAGNOSIS — I70602 Unspecified atherosclerosis of nonbiological bypass graft(s) of the extremities, left leg: Secondary | ICD-10-CM | POA: Diagnosis not present

## 2022-12-02 DIAGNOSIS — Z7984 Long term (current) use of oral hypoglycemic drugs: Secondary | ICD-10-CM | POA: Diagnosis not present

## 2022-12-02 DIAGNOSIS — I6523 Occlusion and stenosis of bilateral carotid arteries: Secondary | ICD-10-CM | POA: Insufficient documentation

## 2022-12-02 DIAGNOSIS — Z9889 Other specified postprocedural states: Secondary | ICD-10-CM

## 2022-12-02 DIAGNOSIS — Z7902 Long term (current) use of antithrombotics/antiplatelets: Secondary | ICD-10-CM | POA: Insufficient documentation

## 2022-12-02 DIAGNOSIS — Z87891 Personal history of nicotine dependence: Secondary | ICD-10-CM | POA: Insufficient documentation

## 2022-12-02 DIAGNOSIS — T82599A Other mechanical complication of unspecified cardiac and vascular devices and implants, initial encounter: Secondary | ICD-10-CM

## 2022-12-02 DIAGNOSIS — E1151 Type 2 diabetes mellitus with diabetic peripheral angiopathy without gangrene: Secondary | ICD-10-CM | POA: Diagnosis not present

## 2022-12-02 DIAGNOSIS — I70612 Atherosclerosis of nonbiological bypass graft(s) of the extremities with intermittent claudication, left leg: Secondary | ICD-10-CM

## 2022-12-02 DIAGNOSIS — E785 Hyperlipidemia, unspecified: Secondary | ICD-10-CM | POA: Diagnosis not present

## 2022-12-02 DIAGNOSIS — Z794 Long term (current) use of insulin: Secondary | ICD-10-CM | POA: Insufficient documentation

## 2022-12-02 DIAGNOSIS — Z79899 Other long term (current) drug therapy: Secondary | ICD-10-CM | POA: Insufficient documentation

## 2022-12-02 HISTORY — PX: ABDOMINAL AORTOGRAM W/LOWER EXTREMITY: CATH118223

## 2022-12-02 LAB — GLUCOSE, CAPILLARY
Glucose-Capillary: 79 mg/dL (ref 70–99)
Glucose-Capillary: 96 mg/dL (ref 70–99)

## 2022-12-02 LAB — POCT I-STAT, CHEM 8
BUN: 13 mg/dL (ref 8–23)
Calcium, Ion: 1.19 mmol/L (ref 1.15–1.40)
Chloride: 106 mmol/L (ref 98–111)
Creatinine, Ser: 0.9 mg/dL (ref 0.61–1.24)
Glucose, Bld: 145 mg/dL — ABNORMAL HIGH (ref 70–99)
HCT: 41 % (ref 39.0–52.0)
Hemoglobin: 13.9 g/dL (ref 13.0–17.0)
Potassium: 4 mmol/L (ref 3.5–5.1)
Sodium: 141 mmol/L (ref 135–145)
TCO2: 24 mmol/L (ref 22–32)

## 2022-12-02 SURGERY — ABDOMINAL AORTOGRAM W/LOWER EXTREMITY
Anesthesia: LOCAL

## 2022-12-02 MED ORDER — SODIUM CHLORIDE 0.9 % IV SOLN: INTRAVENOUS | Status: DC

## 2022-12-02 MED ORDER — SODIUM CHLORIDE 0.9 % IV SOLN: 250.0000 mL | INTRAVENOUS | Status: DC | PRN

## 2022-12-02 MED ORDER — LABETALOL HCL 5 MG/ML IV SOLN: 10.0000 mg | INTRAVENOUS | Status: DC | PRN

## 2022-12-02 MED ORDER — SODIUM CHLORIDE 0.9% FLUSH: 3.0000 mL | Freq: Two times a day (BID) | INTRAVENOUS | Status: DC

## 2022-12-02 MED ORDER — ACETAMINOPHEN 325 MG PO TABS: 650.0000 mg | ORAL_TABLET | ORAL | Status: DC | PRN

## 2022-12-02 MED ORDER — HEPARIN (PORCINE) IN NACL 1000-0.9 UT/500ML-% IV SOLN
INTRAVENOUS | Status: DC | PRN
  Administered 2022-12-02 (×2): 500 mL

## 2022-12-02 MED ORDER — LIDOCAINE HCL (PF) 1 % IJ SOLN
INTRAMUSCULAR | Status: AC
  Filled 2022-12-02: qty 30

## 2022-12-02 MED ORDER — LIDOCAINE HCL (PF) 1 % IJ SOLN
INTRAMUSCULAR | Status: DC | PRN
  Administered 2022-12-02: 20 mL

## 2022-12-02 MED ORDER — HYDRALAZINE HCL 20 MG/ML IJ SOLN: 5.0000 mg | INTRAMUSCULAR | Status: DC | PRN

## 2022-12-02 MED ORDER — IODIXANOL 320 MG/ML IV SOLN
INTRAVENOUS | Status: DC | PRN
  Administered 2022-12-02: 85 mL

## 2022-12-02 MED ORDER — ONDANSETRON HCL 4 MG/2ML IJ SOLN: 4.0000 mg | Freq: Four times a day (QID) | INTRAMUSCULAR | Status: DC | PRN

## 2022-12-02 MED ORDER — SODIUM CHLORIDE 0.9 % WEIGHT BASED INFUSION: 1.0000 mL/kg/h | INTRAVENOUS | Status: DC

## 2022-12-02 MED ORDER — FENTANYL CITRATE (PF) 100 MCG/2ML IJ SOLN
INTRAMUSCULAR | Status: AC
  Filled 2022-12-02: qty 2

## 2022-12-02 MED ORDER — SODIUM CHLORIDE 0.9% FLUSH: 3.0000 mL | INTRAVENOUS | Status: DC | PRN

## 2022-12-02 MED ORDER — FENTANYL CITRATE (PF) 100 MCG/2ML IJ SOLN
INTRAMUSCULAR | Status: DC | PRN
  Administered 2022-12-02: 50 ug via INTRAVENOUS

## 2022-12-02 MED ORDER — MIDAZOLAM HCL 2 MG/2ML IJ SOLN
INTRAMUSCULAR | Status: DC | PRN
  Administered 2022-12-02: 1 mg via INTRAVENOUS

## 2022-12-02 MED ORDER — MIDAZOLAM HCL 2 MG/2ML IJ SOLN
INTRAMUSCULAR | Status: AC
  Filled 2022-12-02: qty 2

## 2022-12-02 SURGICAL SUPPLY — 9 items
CATH OMNI FLUSH 5F 65CM (CATHETERS) IMPLANT
CLOSURE MYNX CONTROL 5F (Vascular Products) IMPLANT
GLIDEWIRE ADV .035X260CM (WIRE) IMPLANT
KIT MICROPUNCTURE NIT STIFF (SHEATH) IMPLANT
SET ATX-X65L (MISCELLANEOUS) IMPLANT
SHEATH PINNACLE 5F 10CM (SHEATH) IMPLANT
SHEATH PROBE COVER 6X72 (BAG) IMPLANT
TRAY PV CATH (CUSTOM PROCEDURE TRAY) ×1 IMPLANT
WIRE STARTER BENTSON 035X150 (WIRE) IMPLANT

## 2022-12-02 NOTE — H&P (Signed)
Patient seen and examined in preop holding.  No complaints. No changes to medication history or physical exam since last seen in clinic. After discussing the risks and benefits of LLE angiogram with intervention, [name] elected to proceed.   [name] MD   _______________________________________________________________________________________________________________    HISTORY AND PHYSICAL        CC:  follow up. Requesting Provider:  Ignatius Specking, MD   HPI: This is a 72 y.o. male who is here today for follow up for PAD.  Pt has hx of left femoral to popliteal (AK) bypass with PTFE on 11/06/2005 by Dr. Princess [name], redo left CFA to AK popliteal bypass with PTFE on 06/22/2010 by Dr. Hart [name],  right subintimal angioplasty and stenting of right CIA 10/03/2011 by Dr. Imogene [name], angiogram with drug coated angioplasty right CIA 01/16/2017 by Dr. Imogene [name], angiogram with right mid SFA angioplasty with drug coated balloon, right proximal ATA angioplasty 03/09/2020 by Dr. Chestine [name].    Pt was last seen 05/14/2022 and at that time, he was not having any issues.     He called with concerns for intermittent pain in the right calf and left groin.    The pt returns today for follow up.   He denies any pain in his feet at night or ulcerations.  He states he has intermittent pain in the left groin and right calf.  He is diabetic and last A1c was 7.5, which was improved from 8.5 the time before.     The pt is on a statin for cholesterol management.    The pt is not on an aspirin.    Other AC:  Plavix/Pletal The pt is not on medication for hypertension.  The pt is  on medication for diabetes. Tobacco hx:  former   Pt does not have family hx of AAA.       Past Medical History:  Diagnosis Date   Anemia     Bronchitis     Carotid artery disease (HCC)     Cervical spine disease      Neck surgery in 2007 with Dr. Jeral [name]   Collagen vascular disease [facility])     COPD (chronic obstructive pulmonary disease) (HCC)      Coronary atherosclerosis of native coronary artery      Nonobstrtuctive by catheterization 2007, 2013, 2021   DVT (deep venous thrombosis) (HCC)     Essential hypertension     GERD (gastroesophageal reflux disease)     Hyperlipidemia     PAD (peripheral artery disease) (HCC)      s/p left fem-pop bypass 2007; PTA and stenting of right iliac artery 09/2011   Rheumatoid arthritis(714.0)     Type 2 diabetes mellitus (HCC)                 Past Surgical History:  Procedure Laterality Date   ABDOMINAL AORTAGRAM N/A 10/03/2011    Procedure: ABDOMINAL Ronny Flurry;  Surgeon: Brett Hertz, MD;  Location: [facility] CATH LAB;  Service: Cardiovascular;  Laterality: N/A;   ABDOMINAL AORTOGRAM W/LOWER EXTREMITY N/A 01/16/2017    Procedure: ABDOMINAL AORTOGRAM W/LOWER EXTREMITY;  Surgeon: Brett Hertz, MD;  Location: [facility] INVASIVE CV LAB;  Service: Cardiovascular;  Laterality: N/A;   ABDOMINAL AORTOGRAM W/LOWER EXTREMITY Bilateral 08/04/2019    Procedure: ABDOMINAL AORTOGRAM W/LOWER EXTREMITY;  Surgeon: Brett Shelling, MD;  Location: [facility] INVASIVE CV LAB;  Service: Cardiovascular;  Laterality: Bilateral;   ABDOMINAL AORTOGRAM W/LOWER EXTREMITY N/A 03/09/2020    Procedure: ABDOMINAL AORTOGRAM  W/LOWER EXTREMITY;  Surgeon: Brett Shelling, MD;  Location: [facility] INVASIVE CV LAB;  Service: Cardiovascular;  Laterality: N/A;   BIOPSY N/A 12/28/2021    Procedure: BIOPSY;  Surgeon: Brett Bal, DO;  Location: [facility] ENDO SUITE;  Service: Endoscopy;  Laterality: N/A;   CERVICAL DISC SURGERY       COLONOSCOPY   2013    Hyperplastic polyp   COLONOSCOPY WITH PROPOFOL N/A 02/17/2017    Surgeon: Brett Ade, MD; Many small and large mouth diverticula in the sigmoid, descending, transverse colon, internal hemorrhoids that were medium sized and grade 1.   COLONOSCOPY WITH PROPOFOL N/A 12/28/2021    Procedure: COLONOSCOPY WITH PROPOFOL;  Surgeon: Brett Bal, DO;  Location: [facility] ENDO SUITE;  Service:  Endoscopy;  Laterality: N/A;  10:45 am   ESOPHAGOGASTRODUODENOSCOPY (EGD) WITH PROPOFOL N/A 12/28/2021    Procedure: ESOPHAGOGASTRODUODENOSCOPY (EGD) WITH PROPOFOL;  Surgeon: Brett Bal, DO;  Location: [facility] ENDO SUITE;  Service: Endoscopy;  Laterality: N/A;   FEMORAL-POPLITEAL BYPASS GRAFT   11/06/2005   ILIAC ARTERY STENT   10/03/2011    Right CIA stenting   LEFT HEART CATH AND CORONARY ANGIOGRAPHY N/A 04/14/2019    Procedure: LEFT HEART CATH AND CORONARY ANGIOGRAPHY;  Surgeon: Brett Bihari, MD;  Location: [facility] INVASIVE CV LAB;  Service: Cardiovascular;  Laterality: N/A;   PERIPHERAL VASCULAR BALLOON ANGIOPLASTY Right 01/16/2017    Procedure: PERIPHERAL VASCULAR BALLOON ANGIOPLASTY;  Surgeon: Brett Hertz, MD;  Location: [facility] INVASIVE CV LAB;  Service: Cardiovascular;  Laterality: Right;  common iliac     PERIPHERAL VASCULAR BALLOON ANGIOPLASTY   03/09/2020    Procedure: PERIPHERAL VASCULAR BALLOON ANGIOPLASTY;  Surgeon: Brett Shelling, MD;  Location: [facility] INVASIVE CV LAB;  Service: Cardiovascular;;   PR VEIN BYPASS GRAFT,AORTO-FEM-POP   06/22/2010    Redo Left Fem-Pop   Repair of left arm fracture       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 8 g 2   atorvastatin (LIPITOR) 80 MG tablet TAKE 1 TABLET(80 MG) BY MOUTH DAILY 90 tablet 3   cilostazol (PLETAL) 100 MG tablet TAKE 1 TABLET(100 MG) BY MOUTH TWICE DAILY 180 tablet 3   clopidogrel (PLAVIX) 75 MG tablet Take 1 tablet (75 mg total) by mouth daily. 30 tablet 11   dexlansoprazole (DEXILANT) 60 MG capsule Take 1 capsule (60 mg total) by mouth daily. 30 capsule 11   diclofenac Sodium (VOLTAREN) 1 % GEL Apply 2 gram  to painful foot and  ankle joint twice daily. 150 g 1   glipiZIDE (GLUCOTROL) 5 MG tablet Take 5 mg by mouth daily.       HYDROcodone-acetaminophen (NORCO) 7.5-325 MG per tablet Take 1-2 tablets by mouth 2 (two) times daily as needed for moderate pain.       JANUVIA 100 MG tablet Take 100 mg by mouth daily.       ketoconazole (NIZORAL) 2 % cream Apply topically daily.       LANTUS SOLOSTAR 100 UNIT/ML Solostar Pen Inject into the skin.       metoprolol  succinate (TOPROL XL) 25 MG 24 hr tablet Take 0.5 tablets (12.5 mg total) by mouth daily.       nitroGLYCERIN (NITROSTAT) 0.4 MG SL tablet Place 1 tablet (0.4 mg total) under the tongue every 5 (five) minutes x 3 doses as needed for chest pain (If no relief after 3rd dose, GO TO ER.). 25 tablet 3   Omega-3 Fatty Acids (OMEGA-3 FISH OIL PO) Take 1 capsule by mouth daily.       ONE TOUCH ULTRA TEST test strip         OneTouch Delica Lancets 33G MISC Apply topically.       Plecanatide (TRULANCE) 3 MG TABS Take 1 tablet (3 mg total) by mouth daily. 30 tablet 5   sildenafil (REVATIO) 20 MG tablet Take 40-60 mg by mouth daily as needed for erectile dysfunction.       SPIRIVA RESPIMAT 2.5 MCG/ACT AERS INHALE 2 PUFFS INTO THE LUNGS DAILY 4 g 11   SYMBICORT 160-4.5 MCG/ACT inhaler Inhale into the lungs.       XELJANZ 5 MG TABS TAKE 1 TABLET(5 MG) BY MOUTH DAILY 90 tablet 0      No current facility-administered medications for this visit.             Family History  Problem Relation Age of Onset   Heart attack Mother 32   Deep vein thrombosis Mother     Diabetes Mother     Hyperlipidemia Mother     Hypertension Mother     Peripheral vascular disease Mother     Stroke Father     Diabetes Father     Hyperlipidemia Father     Hypertension Father     Peripheral vascular disease Father     Diabetes Sister     Hyperlipidemia Sister     Hypertension Sister     Diabetes Brother     Hyperlipidemia Brother     Cancer Brother     Deep vein thrombosis Brother      Hypertension Brother     Peripheral vascular disease Brother     Hyperlipidemia Son     Cancer Son     Hypertension Son     Diabetes Daughter     Colon cancer Neg Hx            Social History         Socioeconomic History   Marital status: Married      Spouse name: Not on file   Number of children: Not on file   Years of education: Not on file   Highest education level: Not on file  Occupational History   Not on file  Tobacco Use   Smoking status: Former      Current packs/day: 0.00      Average packs/day: 1.5 packs/day for 45.0 years (67.5 ttl pk-yrs)      Types: Cigarettes, Cigars      Start date: 09/01/1966      Quit date: 09/01/2011      Years since quitting: 11.2      Passive exposure: Never   Smokeless tobacco: Never  Vaping Use   Vaping status: Never Used  Substance and Sexual Activity   Alcohol use: Not Currently   Drug use: No   Sexual activity: Not Currently      Birth control/protection: None  Other Topics Concern   Not on file  Social History Narrative    The patient lives in [place].  Social Determinants of Health    Financial Resource Strain: Not on file  Food Insecurity: Not on file  Transportation Needs: Not on file  Physical Activity: Not on file  Stress: Not on file  Social Connections: Not on file  Intimate Partner Violence: Not on file        REVIEW OF SYSTEMS:    [X]  denotes positive finding, [ ]  denotes negative finding Cardiac   Comments:  Chest pain or chest pressure:      Shortness of breath upon exertion:      Short of breath when lying flat:      Irregular heart rhythm:             Vascular      Pain in calf, thigh, or hip brought on by ambulation:      Pain in feet at night that wakes you up from your sleep:       Blood clot in your veins:      Leg swelling:              Pulmonary      Oxygen at home:      Productive cough:       Wheezing:              Neurologic      Sudden weakness in arms or legs:       Sudden  numbness in arms or legs:       Sudden onset of difficulty speaking or slurred speech:      Temporary loss of vision in one eye:       Problems with dizziness:              Gastrointestinal      Blood in stool:       Vomited blood:              Genitourinary      Burning when urinating:       Blood in urine:             Psychiatric      Major depression:              Hematologic      Bleeding problems:      Problems with blood clotting too easily:             Skin      Rashes or ulcers:             Constitutional      Fever or chills:          PHYSICAL EXAMINATION:       Today's Vitals    11/26/22 0855 11/26/22 0857  BP: (!) 150/83    Pulse: 86    Resp: 18    Temp: 97.6 F (36.4 C)    TempSrc: Temporal    SpO2: 92%    Weight: 166 lb 11.2 oz (75.6 kg)    Height: 5\' 9"  (1.753 m)    PainSc: 8  8   PainLoc: Groin      Body mass index is 24.62 kg/m.     General:  WDWN in NAD; vital signs documented above Gait: Not observed HENT: WNL, normocephalic Pulmonary: normal non-labored breathing , without wheezing Cardiac: regular HR, without carotid bruits Abdomen: soft, NT; aortic pulse is not palpable Skin: without rashes Vascular Exam/Pulses: Palpable femoral and radial pulses bilaterally Monophasic doppler flow bilateral peroneal > DP/PT Extremities: without ischemic changes, without Gangrene ,  without cellulitis; without open wounds Musculoskeletal: no muscle wasting or atrophy       Neurologic: A&O X 3 Psychiatric:  The pt has Normal affect.     Non-Invasive Vascular Imaging:   ABI's/TBI's on 11/25/2022: Right:  /0.42 - Great toe pressure: 64 Left:  1.27/0.28 - Great toe pressure: 42   Arterial duplex on 11/25/2022: Left Graft #1: Fem- pop bypass  +--------------------+--------+--------+---------+--------+                     PSV cm/sStenosisWaveform Comments  +--------------------+--------+--------+---------+--------+  Inflow              80              triphasic          +--------------------+--------+--------+---------+--------+  Proximal Anastomosis104             triphasic          +--------------------+--------+--------+---------+--------+  Proximal Graft      58              triphasic          +--------------------+--------+--------+---------+--------+  Mid Graft           44              triphasic          +--------------------+--------+--------+---------+--------+  Distal Graft        37              triphasic          +--------------------+--------+--------+---------+--------+  Distal Anastomosis  34              triphasic          +--------------------+--------+--------+---------+--------+  Outflow            37              triphasic          +--------------------+--------+--------+---------+--------+      Abdominal Aorta Findings:  +-------------+-------+----------+----------+---------+--------+--------+  Location    AP (cm)Trans (cm)PSV (cm/s)Waveform ThrombusComments  +-------------+-------+----------+----------+---------+--------+--------+  Proximal                     67        triphasic                  +-------------+-------+----------+----------+---------+--------+--------+  Mid         2.05   1.96      42        biphasic                   +-------------+-------+----------+----------+---------+--------+--------+  Distal      1.80   1.88      76        triphasic                  +-------------+-------+----------+----------+---------+--------+--------+  RT CIA Prox  1.4    1.4                 biphasic                   +-------------+-------+----------+----------+---------+--------+--------+  LT CIA Prox                   139       triphasic                  +-------------+-------+----------+----------+---------+--------+--------+  LT CIA Mid  112       biphasic                    +-------------+-------+----------+----------+---------+--------+--------+  LT CIA Distal                 116       triphasic                  +-------------+-------+----------+----------+---------+--------+--------+  LT EIA Prox                   102       triphasic                  +-------------+-------+----------+----------+---------+--------+--------+  LT EIA Mid                    86        triphasic                  +-------------+-------+----------+----------+---------+--------+--------+  LT EIA Distal                 58        triphasic                  +-------------+-------+----------+----------+---------+--------+--------+   Visualization of the Proximal Abdominal Aorta was limited.   Right Stent(s):  +-------------------------------+--------+--------+---------+--------+  Right common iliac artery stentPSV cm/sStenosisWaveform Comments  +-------------------------------+--------+--------+---------+--------+  Prox to Stent                  76              triphasic          +-------------------------------+--------+--------+---------+--------+  Proximal Stent                 118             triphasic          +-------------------------------+--------+--------+---------+--------+  Mid Stent                      140             triphasic          +-------------------------------+--------+--------+---------+--------+  Distal Stent                   156             triphasic          +-------------------------------+--------+--------+---------+--------+  Distal to Stent                187             triphasic          +-------------------------------+--------+--------+---------+--------+   Summary:  Stenosis: +--------------------+-------------+--------------+  Location            Stenosis     Stent           +--------------------+-------------+--------------+  Distal Aorta        <50% stenosis                 +--------------------+-------------+--------------+  Right Common Iliac               1-49% stenosis  +--------------------+-------------+--------------+  Left Common Iliac   <50% stenosis                +--------------------+-------------+--------------+  Right External Iliac<50% stenosis                +--------------------+-------------+--------------+  Left External Iliac <50% stenosis                +--------------------+-------------+--------------+    Previous ABI's/TBI's on 05/14/2022: Right:  [place]/0.53 - Great toe pressure: 69 Left:  1.25/0.31 - Great toe pressure:  41   Previous arterial duplex on 05/14/2022: Left Graft #1: Left common femoral-popliteal artery bypass graft  +--------------------+--------+--------+---------+--------+                     PSV cm/sStenosisWaveform Comments  +--------------------+--------+--------+---------+--------+  Inflow             78              triphasic          +--------------------+--------+--------+---------+--------+  Proximal Anastomosis89              triphasic          +--------------------+--------+--------+---------+--------+  Proximal Graft      68              triphasic          +--------------------+--------+--------+---------+--------+  Mid Graft           53              triphasic          +--------------------+--------+--------+---------+--------+  Distal Graft        66              triphasic          +--------------------+--------+--------+---------+--------+  Distal Anastomosis  104             triphasic          +--------------------+--------+--------+---------+--------+  Outflow            55              triphasic          +--------------------+--------+--------+---------+--------+        ASSESSMENT/PLAN:: 72 y.o. male here for follow up for PAD with hx of left femoral to popliteal (AK) bypass with PTFE on 11/06/2005 by Dr. Princess [name], redo left CFA to  AK popliteal bypass with PTFE on 06/22/2010 by Dr. Hart [name],  right subintimal angioplasty and stenting of right CIA 10/03/2011 by Dr. Imogene [name], angiogram with drug coated angioplasty right CIA 01/16/2017 by Dr. Imogene [name], angiogram with right mid SFA angioplasty with drug coated balloon, right proximal ATA angioplasty 03/09/2020 by Dr. Chestine [name].        -pt with monophasic doppler flow bilateral feet with peroneal > DP/PT bilaterally -his duplex of his left leg bypass shows decreased velocities, which can be indicative of a threatened bypass.  Discussed proceeding with angiography.  He is planning to go to [place] for Thanksgiving.  Will set him up for the week he returns.  Dr. Chestine [name] did his last angiogram, however, he is out for the next couple of weeks so will have one of his partners proceed.  -continue plavix/statin -he did have a carotid duplex in March 2024 that revealed 1-39% bilateral ICA stenosis.      Doreatha Massed, [facility] [phone]   Clinic MD:   Karin Lieu

## 2022-12-02 NOTE — Progress Notes (Signed)
Patient and spouse was given discharge instructions. Both verbalized understanding.

## 2022-12-02 NOTE — Op Note (Addendum)
    Patient name: [name] MRN: [id number] DOB: [birth year] Sex: male  12/02/2022 Pre-operative Diagnosis: PAD with low velocity in left femoral-popliteal bypass Post-operative diagnosis:  Same Surgeon:  [name], MD Procedure Performed:  Ultrasound-guided access of right common femoral artery Aortogram Second order cannulation of the left external iliac artery Left lower extremity angiogram Right lower extremity angiogram Closure of right common femoral artery with Mynx closure device 61-minute conscious sedation   Indications: [name] is a 72 year old male whose had multiple open and endovascular revascularizations.  Most recently a right proximal AT angioplasty was performed by Dr. Chestine Spore in March 2022.  He has since been in routine surveillance and most recent noninvasive studies demonstrated a slight decrease in the velocities of his left femoral to above-knee popliteal with PTFE bypass graft.  Risk and benefits of angiogram for bypass surveillance were reviewed and he like to proceed.  Findings:  Patent aorta and bilateral renal arteries.    Widely patent right common iliac artery stent, undersized distally but no flow-limiting stenosis of bilateral iliac systems. The right common femoral, profunda and SFA are patent.  The popliteal artery was patent with a less than 50% stenosis in P2.  There is two-vessel runoff via the AT and peroneal.  There is a focal stenosis of the AT, approximately 50%.    The left common femoral and profunda were patent.  The left femoropopliteal bypass graft was patent with a focal lesion in the hood of the bypass although this did not appear to be flow-limiting.  The bypass was widely patent without flow-limiting stenosis.  The popliteal artery was widely patent without flow-limiting stenosis.  There was one-vessel runoff via the peroneal which fills plantar branches and the DP on the foot.  The PT and AT are occluded without  reconstitution.     Procedure:  The patient was identified in the holding area and taken to the cath lab  The patient was then placed supine on the table and prepped and draped in the usual sterile fashion.  A time out was called.  Ultrasound was used to evaluate the right common femoral artery.  It was patent .  A digital ultrasound image was acquired.  A micropuncture needle was used to access the right common femoral artery under ultrasound guidance.  An 018 wire was advanced without resistance and a micropuncture sheath was placed.  The 018 wire was removed and a benson wire was placed.  The micropuncture sheath was exchanged for a 5 french sheath.  An omniflush catheter was advanced over the wire to the level of L-1.  An abdominal angiogram was obtained.  Next, using the omniflush catheter and a glide advantage wire, the aortic bifurcation was crossed and the catheter was placed into theleft external iliac artery and left runoff was obtained. This demonstrated the above findings.  I consulted with my partner Dr. Lenell Antu who agreed that the lesion in the hood of the femoropopliteal bypass did not appear to be flow-limiting and would put him at more risk of distal embolization if intervened upon. right runoff was performed via retrograde sheath injections which demonstrated the above findings.  Sedation: 61 minutes Contrast: 85 cc  Impression: Patent left femoropopliteal bypass.  Plan to continue with dual antiplatelet and have short interval follow-up with repeat noninvasive studies in 3 months.   [name] MD [facility] [place] Office: [phone]

## 2022-12-03 ENCOUNTER — Encounter (HOSPITAL_COMMUNITY): Payer: Self-pay | Admitting: Vascular Surgery

## 2022-12-06 DIAGNOSIS — I10 Essential (primary) hypertension: Secondary | ICD-10-CM | POA: Diagnosis not present

## 2022-12-06 DIAGNOSIS — E1165 Type 2 diabetes mellitus with hyperglycemia: Secondary | ICD-10-CM | POA: Diagnosis not present

## 2022-12-14 ENCOUNTER — Encounter: Payer: Self-pay | Admitting: Gastroenterology

## 2022-12-14 NOTE — Progress Notes (Unsigned)
Referring Provider: Ignatius Specking, MD Primary Care Physician:  Ignatius Specking, MD Primary GI Physician: Dr. Marletta Lor  Chief Complaint  Patient presents with   Follow-up    Constipation. Sometimes has a little pain in stomach. Not taking the medication the way it was prescribed. When taken a prescribed it gave him diarrhea.    HPI:   [name] is a 72 y.o. male with history of severe peripheral vascular disease status post multiple interventions, CAD, heart palpitations, COPD, DVT, HTN, HLD, type 2 diabetes, rheumatoid arthritis, IDA previously, GERD, H. 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   Last seen in the office 10/23/2022.  Chronic constipation not adequately managed.  Previously Linzeaa 145 mcg too strong, now Linzess 72 mcg not strong enough and also reported side effect of upper abdominal pain  He was taking Dulcolax weekly to have a bowel movement.  Plan to try him on Trulance 3 mg daily.  Samples were provided and requested progress report in 1 week.   Patient called to report Trulance was working well on 10/31.  Today: Taking Trulance about once a week. Will have 1-2 diarrhea type stools. When not taking Trulance, he does have a BM, but stools are harder or incomplete.  Never tried taking Trulance every day for any length of time due to the diarrhea.  States he has hemorrhoids and if he has more than 1 or 2 bowel movements a day, his hemorrhoid will flareup.  He is taking OTC stool softeners prn when taking pain medications, but this is not every day. No brbpr or melena. Abdominal pain seems to be improved over.   GERD is well controlled with Dexilant daily.   Reports he has been having some problems  with blockages in his left leg.  States he is following with vascular.  They have been working on this, but states if things do not get better, he may have to get his leg amputated.  Past Medical History:  Diagnosis Date   Anemia    Bronchitis    Carotid artery disease (HCC)    Cervical spine disease    Neck surgery in 2007 with Dr. Jeral Fruit   Collagen vascular disease [facility])    COPD (chronic obstructive pulmonary disease) (HCC)    Coronary atherosclerosis of native coronary artery    Nonobstrtuctive by catheterization 2007, 2013, 2021   DVT (deep venous thrombosis) (HCC)    Essential hypertension    GERD (gastroesophageal reflux disease)    Hyperlipidemia    PAD (peripheral artery disease) (HCC)    s/p left fem-pop bypass 2007; PTA and stenting of right iliac artery 09/2011   Rheumatoid arthritis(714.0)    Type 2 diabetes mellitus (HCC)     Past Surgical History:  Procedure Laterality Date   ABDOMINAL AORTAGRAM N/A 10/03/2011   Procedure: ABDOMINAL Ronny Flurry;  Surgeon: Fransisco Hertz, MD;  Location: [facility] CATH LAB;  Service: Cardiovascular;  Laterality: N/A;   ABDOMINAL AORTOGRAM W/LOWER EXTREMITY N/A 01/16/2017   Procedure: ABDOMINAL AORTOGRAM W/LOWER EXTREMITY;  Surgeon: Fransisco Hertz, MD;  Location: [facility] INVASIVE CV LAB;  Service: Cardiovascular;  Laterality: N/A;   ABDOMINAL AORTOGRAM W/LOWER EXTREMITY Bilateral 08/04/2019   Procedure: ABDOMINAL AORTOGRAM W/LOWER EXTREMITY;  Surgeon: Cephus Shelling, MD;  Location:  [facility] INVASIVE CV LAB;  Service: Cardiovascular;  Laterality: Bilateral;   ABDOMINAL AORTOGRAM W/LOWER EXTREMITY N/A 03/09/2020   Procedure: ABDOMINAL AORTOGRAM W/LOWER EXTREMITY;  Surgeon: Cephus Shelling, MD;  Location: [facility] INVASIVE CV LAB;  Service: Cardiovascular;  Laterality: N/A;   ABDOMINAL AORTOGRAM W/LOWER EXTREMITY N/A 12/02/2022   Procedure: ABDOMINAL AORTOGRAM W/LOWER EXTREMITY;  Surgeon: [name], MD;  Location: [facility] INVASIVE CV LAB;  Service:  Cardiovascular;  Laterality: N/A;   BIOPSY N/A 12/28/2021   Procedure: BIOPSY;  Surgeon: Lanelle Bal, DO;  Location: [facility] ENDO SUITE;  Service: Endoscopy;  Laterality: N/A;   CERVICAL DISC SURGERY     COLONOSCOPY  2013   Hyperplastic polyp   COLONOSCOPY WITH PROPOFOL N/A 02/17/2017   Surgeon: Corbin Ade, MD; Many small and large mouth diverticula in the sigmoid, descending, transverse colon, internal hemorrhoids that were medium sized and grade 1.   COLONOSCOPY WITH PROPOFOL N/A 12/28/2021   Surgeon: Lanelle Bal, DO;  Diverticulosis in the sigmoid colon and descending colon.  Likely benign tumor in the ascending colon.   ESOPHAGOGASTRODUODENOSCOPY (EGD) WITH PROPOFOL N/A 12/28/2021   Procedure: ESOPHAGOGASTRODUODENOSCOPY (EGD) WITH PROPOFOL;  Surgeon: Lanelle Bal, DO;  Location: [facility];  Service: Endoscopy;  Laterality: N/A;   FEMORAL-POPLITEAL BYPASS GRAFT  11/06/2005   ILIAC ARTERY STENT  10/03/2011   Right CIA stenting   LEFT HEART CATH AND CORONARY ANGIOGRAPHY N/A 04/14/2019   Procedure: LEFT HEART CATH AND CORONARY ANGIOGRAPHY;  Surgeon: Lennette Bihari, MD;  Location: [facility] INVASIVE CV LAB;  Service: Cardiovascular;  Laterality: N/A;   PERIPHERAL VASCULAR BALLOON ANGIOPLASTY Right 01/16/2017   Procedure: PERIPHERAL VASCULAR BALLOON ANGIOPLASTY;  Surgeon: Fransisco Hertz, MD;  Location: [facility] INVASIVE CV LAB;  Service: Cardiovascular;  Laterality: Right;  common iliac    PERIPHERAL VASCULAR BALLOON ANGIOPLASTY  03/09/2020   Procedure: PERIPHERAL VASCULAR BALLOON ANGIOPLASTY;  Surgeon: Cephus Shelling, MD;  Location: [facility] INVASIVE CV LAB;  Service: Cardiovascular;;   PR VEIN BYPASS GRAFT,AORTO-FEM-POP  06/22/2010   Redo Left Fem-Pop   Repair of left arm fracture     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 8 g 2   atorvastatin (LIPITOR) 80 MG tablet TAKE 1 TABLET(80 MG) BY MOUTH DAILY (Patient taking differently: Take 40 mg by mouth in the morning and at bedtime.) 90 tablet 3   clopidogrel (PLAVIX) 75 MG tablet Take 1 tablet (75 mg total) by mouth daily. 30 tablet 11   dexlansoprazole (DEXILANT) 60 MG capsule Take 1 capsule (60 mg total) by mouth daily. 30 capsule 11   diclofenac Sodium (VOLTAREN) 1 % GEL Apply 2 gram  to painful foot and ankle joint twice daily. (Patient taking differently: Apply 2 g topically 2 (two) times daily as needed (pain (foot/ankle)). Apply 2 gram  to painful foot and ankle joint twice daily.) 150 g 1   empagliflozin (JARDIANCE) 25 MG TABS tablet Take 25 mg by mouth in the morning.     glipiZIDE (GLUCOTROL) 5 MG tablet Take 5-10 mg by mouth See admin instructions.     HYDROcodone-acetaminophen (NORCO) 7.5-325 MG per tablet Take 1 tablet by mouth 2 (two) times daily as needed (pain.).     JANUVIA  100 MG tablet Take 100 mg by mouth in the morning.     ketoconazole (NIZORAL) 2 % cream Apply 1 Application topically daily as needed (skin irritation (feet/toes)).     LANTUS SOLOSTAR 100 UNIT/ML Solostar Pen Inject 10 Units into the skin at bedtime.     metoprolol succinate (TOPROL XL) 25 MG 24 hr tablet Take 0.5 tablets (12.5 mg total) by mouth daily.     nitroGLYCERIN (NITROSTAT) 0.4 MG SL tablet Place 1 tablet (0.4 mg total) under the tongue every 5 (five) minutes x 3 doses as needed for chest pain (If no relief after 3rd dose, GO TO ER.). 25 tablet 3   Omega-3 Fatty Acids (OMEGA-3 FISH OIL PO) Take 1 capsule by mouth daily.     ONE TOUCH ULTRA TEST test strip      OneTouch Delica Lancets 33G MISC Apply topically.     Plecanatide (TRULANCE) 3 MG TABS Take 1 tablet (3 mg total) by mouth daily. (Patient taking differently: Take 3 mg by mouth daily as needed (constipation).) 30 tablet 5    sildenafil (REVATIO) 20 MG tablet Take 40-60 mg by mouth daily as needed for erectile dysfunction.     SPIRIVA RESPIMAT 2.5 MCG/ACT AERS INHALE 2 PUFFS INTO THE LUNGS DAILY 4 g 11   SYMBICORT 160-4.5 MCG/ACT inhaler Inhale 2 puffs into the lungs daily.     XELJANZ 5 MG TABS TAKE 1 TABLET(5 MG) BY MOUTH DAILY 90 tablet 0   cilostazol (PLETAL) 100 MG tablet TAKE 1 TABLET(100 MG) BY MOUTH TWICE DAILY 180 tablet 3   No current facility-administered medications for this visit.    Allergies as of 12/16/2022   (No Known Allergies)    Family History  Problem Relation Age of Onset   Heart attack Mother 20   Deep vein thrombosis Mother    Diabetes Mother    Hyperlipidemia Mother    Hypertension Mother    Peripheral vascular disease Mother    Stroke Father    Diabetes Father    Hyperlipidemia Father    Hypertension Father    Peripheral vascular disease Father    Diabetes Sister    Hyperlipidemia Sister    Hypertension Sister    Diabetes Brother    Hyperlipidemia Brother    Cancer Brother    Deep vein thrombosis Brother    Hypertension Brother    Peripheral vascular disease Brother    Hyperlipidemia Son    Cancer Son    Hypertension Son    Diabetes Daughter    Colon cancer Neg Hx     Social History   Socioeconomic History   Marital status: Single    Spouse name: Not on file   Number of children: Not on file   Years of education: Not on file   Highest education level: Not on file  Occupational History   Not on file  Tobacco Use   Smoking status: Former    Current packs/day: 0.00    Average packs/day: 1.5 packs/day for 45.0 years (67.5 ttl pk-yrs)    Types: Cigarettes, Cigars    Start date: 09/01/1966    Quit date: 09/01/2011    Years since quitting: 11.2    Passive exposure: Never   Smokeless tobacco: Never  Vaping Use   Vaping status: Never Used  Substance and Sexual Activity   Alcohol use: Not Currently   Drug use: No   Sexual activity: Not Currently    Birth  control/protection: None  Other Topics Concern  Not on file  Social History Narrative   The patient lives in [place].    Social Determinants of Health   Financial Resource Strain: Not on file  Food Insecurity: Not on file  Transportation Needs: Not on file  Physical Activity: Not on file  Stress: Not on file  Social Connections: Not on file    Review of Systems: Gen: Denies fever, chills, cold or flulike symptoms, presyncope, syncope. GI: Denies vomiting blood, jaundice, and fecal incontinence.   Denies dysphagia or odynophagia. Derm: Denies rash, itching, dry skin Psych: Denies depression, anxiety, memory loss, confusion. No homicidal or suicidal ideation.  Heme: Denies bruising, bleeding, and enlarged lymph nodes.  Physical Exam: BP 104/62 (BP Location: Left Arm, Patient Position: Sitting, Cuff Size: Normal)   Pulse 89   Temp (!) 97.2 F (36.2 C) (Temporal)   Ht 5\' 9"  (1.753 m)   Wt 168 lb 12.8 oz (76.6 kg)   BMI 24.93 kg/m  General:   Alert and oriented. No distress noted. Pleasant and cooperative.  Head:  Normocephalic and atraumatic. Eyes:  Conjuctiva clear without scleral icterus. Heart:  S1, S2 present without murmurs appreciated. Lungs:  Clear to auscultation bilaterally. No wheezes, rales, or rhonchi. No distress.  Abdomen:  +BS, soft, non-tender and non-distended. No rebound or guarding. No HSM or masses noted. Msk:  Symmetrical without gross deformities. Normal posture. Extremities:  Without edema. Neurologic:  Alert and  oriented x4 Psych:  Normal mood and affect.    Assessment:  72 year old male presenting today for follow-up of chronic constipation/suspected component of IBS.  Previously reported Linzess 145 mcg too strong, Linzess 72 mcg not strong enough and also reported side effect of upper abdominal pain. Started on Trulance 3 mg daily 10/23/22, but patient again states he is not able to take this every day due to diarrhea; however, he is using it about  once a week and prefers to continue this current regimen.  He does use over-the-counter stool softeners as needed as he is still having hard or incomplete bowel movements on days he does not take Trulance.  Recommended that he go ahead and take a stool softeners every day when not taking Trulance.  He also has chronic GERD which is well-controlled on Dexilant 60 mg daily.   Plan:  Start Colace 100 mg daily.  May increase to a total of 300 mg daily if needed. Continue Trulance 3 mg as needed. Continue Dexilant 60 mg daily. Follow-up in 6 months or sooner if needed.   Ermalinda Memos, PA-C [facility] 12/16/2022

## 2022-12-16 ENCOUNTER — Encounter: Payer: Self-pay | Admitting: Gastroenterology

## 2022-12-16 ENCOUNTER — Ambulatory Visit (INDEPENDENT_AMBULATORY_CARE_PROVIDER_SITE_OTHER): Payer: 59 | Admitting: Gastroenterology

## 2022-12-16 ENCOUNTER — Ambulatory Visit: Payer: 59 | Admitting: Gastroenterology

## 2022-12-16 VITALS — BP 104/62 | HR 89 | Temp 97.2°F | Ht 69.0 in | Wt 168.8 lb

## 2022-12-16 DIAGNOSIS — K5909 Other constipation: Secondary | ICD-10-CM

## 2022-12-16 DIAGNOSIS — K219 Gastro-esophageal reflux disease without esophagitis: Secondary | ICD-10-CM | POA: Diagnosis not present

## 2022-12-16 DIAGNOSIS — K5900 Constipation, unspecified: Secondary | ICD-10-CM

## 2022-12-16 NOTE — Patient Instructions (Signed)
To help soften up your bowel movements on a daily basis, start taking a over-the-counter stool softener (brand-name Colace or look at the back of the label and make sure that the ingredients as docusate sodium).  Take 1 pill every day.  If this is not working well for you, you can increase up to a total of 3 pills/day.  You may continue to use Trulance 3mg  as needed.  Continue taking Dexilant 60 mg daily for heartburn.  We will follow-up with you in 6 months or sooner if needed.  It was great to see you again today!  I hope you have a fantastic Christmas!  Ermalinda Memos, PA-C [facility]

## 2022-12-23 ENCOUNTER — Encounter (HOSPITAL_COMMUNITY): Payer: 59

## 2022-12-23 ENCOUNTER — Ambulatory Visit: Payer: 59

## 2022-12-23 ENCOUNTER — Other Ambulatory Visit: Payer: Self-pay | Admitting: Internal Medicine

## 2022-12-23 ENCOUNTER — Other Ambulatory Visit (HOSPITAL_COMMUNITY): Payer: 59

## 2022-12-25 DIAGNOSIS — I5022 Chronic systolic (congestive) heart failure: Secondary | ICD-10-CM | POA: Diagnosis not present

## 2022-12-25 DIAGNOSIS — Z299 Encounter for prophylactic measures, unspecified: Secondary | ICD-10-CM | POA: Diagnosis not present

## 2022-12-25 DIAGNOSIS — I739 Peripheral vascular disease, unspecified: Secondary | ICD-10-CM | POA: Diagnosis not present

## 2022-12-25 DIAGNOSIS — I10 Essential (primary) hypertension: Secondary | ICD-10-CM | POA: Diagnosis not present

## 2022-12-25 DIAGNOSIS — R42 Dizziness and giddiness: Secondary | ICD-10-CM | POA: Diagnosis not present

## 2023-01-06 DIAGNOSIS — I10 Essential (primary) hypertension: Secondary | ICD-10-CM | POA: Diagnosis not present

## 2023-01-06 DIAGNOSIS — E1165 Type 2 diabetes mellitus with hyperglycemia: Secondary | ICD-10-CM | POA: Diagnosis not present

## 2023-01-09 ENCOUNTER — Telehealth: Payer: Self-pay | Admitting: Nurse Practitioner

## 2023-01-09 DIAGNOSIS — I739 Peripheral vascular disease, unspecified: Secondary | ICD-10-CM

## 2023-01-09 NOTE — Telephone Encounter (Signed)
   Pt c/o of Chest Pain: STAT if active CP, including tightness, pressure, jaw pain, radiating pain to shoulder/upper arm/back, CP unrelieved by Nitro. Symptoms reported of SOB, nausea, vomiting, sweating.  1. Are you having CP right now? No     2. Are you experiencing any other symptoms (ex. SOB, nausea, vomiting, sweating)? Nausea, dizzy    3. Is your CP continuous or coming and going? Coming and going    4. Have you taken Nitroglycerin ? No    5. How long have you been experiencing CP? 2 weeks     6. If NO CP at time of call then end call with telling Pt to call back or call 911 if Chest pain returns prior to return call from triage team.

## 2023-01-09 NOTE — Telephone Encounter (Signed)
 Spoke to patient who stated that for the past 2 weeks he has been experiencing pain under his left shoulder that radiates to his chest. Pt c/o nausea and dizziness associated with this pain, but denies pressure, radiating pain down his arm, or  tightness. Pt stated that the pain is worse at night when he is laying down. Pt stated that pain is a 7/10 when he has these episodes.   Please advise.

## 2023-01-10 NOTE — Telephone Encounter (Signed)
 Left a message for patient to call office back regarding sooner appt. Appt held for pt on 1/17 with Shawnie Dapper, NP at 11 am.

## 2023-01-10 NOTE — Progress Notes (Signed)
Office Visit Note  Patient: [name]             Date of Birth: Sep 10, 1950           MRN: [id number]             PCP: Ignatius Specking, MD Referring: Ignatius Specking, MD Visit Date: 01/24/2023 Occupation: @GUAROCC @  Subjective:  Medication monitoring  History of Present Illness: [name] is a 73 y.o. male with history of seronegative rheumatoid arthritis and osteoarthritis.  Patient remains on  Xeljanz 5 mg 1 tablet by mouth daily.  He continues to tolerate [name] without any side effects and has not had any recent gaps in therapy.  He denies any signs or symptoms of a rheumatoid arthritis flare.  He has had some increased arthralgias and joint stiffness which he attributes to cooler weather temperatures but denies any joint swelling at this time. He denies any recent or recurrent infections.    Activities of Daily Living:  Patient reports morning stiffness for 15 minutes.   Patient Reports nocturnal pain.  Difficulty dressing/grooming: Denies Difficulty climbing stairs: Reports Difficulty getting out of chair: Reports Difficulty using hands for taps, buttons, cutlery, and/or writing: Denies  Review of Systems  Constitutional:  Positive for fatigue.  HENT:  Negative for mouth sores and mouth dryness.   Eyes:  Negative for dryness.  Respiratory:  Positive for shortness of breath.   Cardiovascular:  Negative for chest pain and palpitations.  Gastrointestinal:  Positive for constipation. Negative for blood in stool and diarrhea.  Endocrine: Negative for increased urination.  Genitourinary:  Negative for involuntary urination.  Musculoskeletal:  Positive for joint pain, joint pain, myalgias, morning stiffness and myalgias. Negative for gait problem, joint swelling, muscle weakness and muscle tenderness.  Skin:  Negative for color change, hair loss and sensitivity to sunlight.  Allergic/Immunologic: Negative for susceptible to infections.  Neurological:  Positive for  dizziness and headaches.  Hematological:  Negative for swollen glands.  Psychiatric/Behavioral:  Negative for depressed mood and sleep disturbance. The patient is not nervous/anxious.     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   Opioid dependence (HCC) 10/15/2018   Other long term (current) drug therapy 10/15/2018   Puncture wound of finger of left hand 08/08/2018   Puncture wound of finger, right, complicated, initial encounter 08/05/2018   DOE (dyspnea on exertion) 11/04/2017   Muscle cramps 07/04/2017   Contracture of left elbow 07/04/2017   High risk medication use 06/23/2017   Primary osteoarthritis of both hands 06/23/2017   Primary osteoarthritis of both feet 06/23/2017   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 factor (HCC) 06/23/2017   DDD (degenerative disc disease), cervical 06/23/2017   Rotator cuff syndrome of right shoulder 06/06/2017   History of  bilateral carpal tunnel release 06/06/2017   Ganglion cyst of wrist, left 06/06/2017   Non-insulin dependent type 2 diabetes mellitus (HCC) 06/06/2017   Chest pain with high  risk for cardiac etiology 04/05/2017   COPD with acute exacerbation (HCC) 03/13/2017   Plantar fasciitis 02/21/2017   Iliac artery stenosis, right (HCC) 02/12/2017   Black stool 02/10/2017   Abnormal CT of the abdomen 02/10/2017   Cervical spondylosis 10/11/2016    Bony growth 10/13/2015   Pain in joint, lower leg 03/02/2014   Discoloration of skin-Left dorsum foot 09/28/2013   Swelling of limb-Left Calf / Leg 09/28/2013   PVD (peripheral vascular disease) (HCC) 10/14/2012   Pain in limb- Left popliteal and calf 10/14/2012   Dyslipidemia, goal LDL below 70    Essential hypertension 03/05/2012   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 Jeral Fruit   Collagen vascular disease [facility])    COPD (chronic obstructive pulmonary disease) (HCC)    Coronary atherosclerosis of native coronary artery    Nonobstrtuctive by catheterization 2007, 2013, 2021   DVT (deep venous thrombosis) (HCC)    Essential hypertension    GERD (gastroesophageal reflux disease)    Hyperlipidemia    PAD (peripheral artery disease) (HCC)    s/p left fem-pop bypass 2007; PTA and stenting of right iliac artery 09/2011   Rheumatoid arthritis(714.0)    Type 2 diabetes mellitus (HCC)     Family History  Problem Relation Age of Onset   Heart attack Mother 73   Deep vein thrombosis Mother    Diabetes Mother    Hyperlipidemia Mother    Hypertension Mother    Peripheral vascular disease Mother    Stroke Father    Diabetes Father    Hyperlipidemia Father    Hypertension Father    Peripheral vascular disease Father    Diabetes Sister    Hyperlipidemia Sister    Hypertension Sister    Diabetes Brother     Hyperlipidemia Brother    Cancer Brother    Deep vein thrombosis Brother    Hypertension Brother    Peripheral vascular disease Brother    Hyperlipidemia Son    Cancer Son    Hypertension Son    Diabetes Daughter    Colon cancer Neg Hx    Past Surgical History:  Procedure Laterality Date   ABDOMINAL AORTAGRAM N/A 10/03/2011   Procedure: ABDOMINAL Ronny Flurry;  Surgeon: Fransisco Hertz, MD;  Location: [facility] CATH LAB;  Service: Cardiovascular;  Laterality: N/A;   ABDOMINAL AORTOGRAM W/LOWER EXTREMITY N/A 01/16/2017   Procedure: ABDOMINAL AORTOGRAM W/LOWER EXTREMITY;  Surgeon: Fransisco Hertz, MD;  Location: [facility] INVASIVE CV LAB;  Service: Cardiovascular;  Laterality: N/A;   ABDOMINAL AORTOGRAM W/LOWER EXTREMITY Bilateral 08/04/2019   Procedure: ABDOMINAL AORTOGRAM W/LOWER EXTREMITY;  Surgeon: Cephus Shelling, MD;  Location: [facility] INVASIVE CV LAB;  Service: Cardiovascular;  Laterality: Bilateral;   ABDOMINAL AORTOGRAM W/LOWER EXTREMITY N/A 03/09/2020   Procedure: ABDOMINAL AORTOGRAM W/LOWER EXTREMITY;  Surgeon: Cephus Shelling, MD;  Location: [facility] INVASIVE CV LAB;  Service: Cardiovascular;  Laterality: N/A;   ABDOMINAL AORTOGRAM  W/LOWER EXTREMITY N/A 12/02/2022   Procedure: ABDOMINAL AORTOGRAM W/LOWER EXTREMITY;  Surgeon: [name], MD;  Location: [facility] INVASIVE CV LAB;  Service: Cardiovascular;  Laterality: N/A;   BIOPSY N/A 12/28/2021   Procedure: BIOPSY;  Surgeon: Lanelle Bal, DO;  Location: [facility] ENDO SUITE;  Service: Endoscopy;  Laterality: N/A;   CERVICAL DISC SURGERY     COLONOSCOPY  2013   Hyperplastic polyp   COLONOSCOPY WITH PROPOFOL N/A 02/17/2017   Surgeon: Corbin Ade, MD; Many small and large mouth diverticula in the sigmoid, descending, transverse colon, internal hemorrhoids that were medium sized and grade 1.   COLONOSCOPY WITH PROPOFOL N/A 12/28/2021   Surgeon: Lanelle Bal, DO;  Diverticulosis in the sigmoid colon and descending colon.  Likely benign tumor in  the ascending colon.   ESOPHAGOGASTRODUODENOSCOPY (EGD) WITH PROPOFOL N/A 12/28/2021   Procedure: ESOPHAGOGASTRODUODENOSCOPY (EGD) WITH PROPOFOL;  Surgeon: Lanelle Bal, DO;  Location: [facility] ENDO SUITE;  Service: Endoscopy;  Laterality: N/A;   FEMORAL-POPLITEAL BYPASS GRAFT  11/06/2005   ILIAC ARTERY STENT  10/03/2011   Right CIA stenting   LEFT HEART CATH AND CORONARY ANGIOGRAPHY N/A 04/14/2019   Procedure: LEFT HEART CATH AND CORONARY ANGIOGRAPHY;  Surgeon: Lennette Bihari, MD;  Location: [facility] INVASIVE CV LAB;  Service: Cardiovascular;  Laterality: N/A;   PERIPHERAL VASCULAR BALLOON ANGIOPLASTY Right 01/16/2017   Procedure: PERIPHERAL VASCULAR BALLOON ANGIOPLASTY;  Surgeon: Fransisco Hertz, MD;  Location: [facility] INVASIVE CV LAB;  Service: Cardiovascular;  Laterality: Right;  common iliac    PERIPHERAL VASCULAR BALLOON ANGIOPLASTY  03/09/2020   Procedure: PERIPHERAL VASCULAR BALLOON ANGIOPLASTY;  Surgeon: Cephus Shelling, MD;  Location: [facility] INVASIVE CV LAB;  Service: Cardiovascular;;   PR VEIN BYPASS GRAFT,AORTO-FEM-POP  06/22/2010   Redo Left Fem-Pop   Repair of left arm fracture     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    Immunization History  Administered Date(s) Administered   Influenza, High Dose Seasonal PF 10/14/2016, 09/21/2018   Influenza-Unspecified 11/08/2008, 12/10/2019   Moderna Sars-Covid-2 Vaccination 02/19/2019, 03/22/2019, 11/23/2019   PPD Test 09/22/2007   Td (Adult),5 Lf Tetanus Toxid, Preservative Free 02/09/2001   Tdap 02/09/2001, 08/05/2018     Objective: Vital Signs: BP 115/76 (BP Location: Left Arm, Patient Position: Sitting, Cuff Size: Normal)   Pulse (!) 109   Ht 5\' 9"  (1.753 m)   Wt 171 lb (77.6 kg)   BMI 25.25 kg/m    Physical Exam Vitals and nursing note reviewed.  Constitutional:      Appearance: He is well-developed.  HENT:     Head:  Normocephalic and atraumatic.  Eyes:     Conjunctiva/sclera: Conjunctivae normal.     Pupils: Pupils are equal, round, and reactive to light.  Cardiovascular:     Rate and Rhythm: Normal rate and regular rhythm.     Heart sounds: Normal heart sounds.  Pulmonary:     Effort: Pulmonary effort is normal.     Breath sounds: Normal breath sounds.  Abdominal:     General: Bowel sounds are normal.     Palpations: Abdomen is soft.  Musculoskeletal:     Cervical back: Normal range of motion and neck supple.  Skin:    General: Skin is warm and dry.     Capillary Refill: Capillary refill takes less than 2 seconds.  Neurological:  Mental Status: He is alert and oriented to person, place, and time.  Psychiatric:        Behavior: Behavior normal.      Musculoskeletal Exam: C-spine has limited range of motion with lateral rotation.  Shoulder joints have good range of motion with some limitation with internal rotation of the right shoulder.  Flexion contractures of both elbows appear unchanged.  Limited extension of both wrist joints but no tenderness or synovitis noted.  No tenderness or synovitis over MCP joints.  PIP and DIP thickening noted.  Limited extension of PIP joints.  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  Hip joints have slightly limited range of motion.  Knee joints have good range of motion with no warmth or effusion.  Ankle joints have good range of motion with no tenderness or joint swelling.  CDAI Exam: CDAI Score: -- Patient Global: --; Provider Global: -- Swollen: --; Tender: -- Joint Exam 01/24/2023   No joint exam has been documented for this visit   There is currently no information documented on the homunculus. Go to the Rheumatology activity and complete the homunculus joint exam.  Investigation: No additional findings.  Imaging: No results found.  Recent Labs: Lab Results  Component Value Date   WBC 7.3 10/16/2022   HGB 13.9 12/02/2022    PLT 284 10/16/2022   NA 141 12/02/2022   K 4.0 12/02/2022   CL 106 12/02/2022   CO2 27 10/16/2022   GLUCOSE 145 (H) 12/02/2022   BUN 13 12/02/2022   CREATININE 0.90 12/02/2022   BILITOT 0.4 10/16/2022   ALKPHOS 79 01/25/2021   AST 18 10/16/2022   ALT 18 10/16/2022   PROT 7.0 10/16/2022   ALBUMIN 4.5 01/25/2021   CALCIUM 9.2 10/16/2022   GFRAA 83 01/27/2020   QFTBGOLDPLUS NEGATIVE 12/11/2021    Speciality Comments: Prior therapy includes: methotrexate ( d/c alcohol use), Arava (d/c patient preference), Orenica (patient declined)  Procedures:  No procedures performed Allergies: Patient has no known allergies.   Assessment / Plan:     Visit Diagnoses: 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 factor (HCC): He has no synovitis on examination today.  He has not had any signs or symptoms of a rheumatoid arthritis flare.  He has clinically been doing well taking Xeljanz 5 mg 1 tablet by mouth daily.  He continues to tolerate [name] without any side effects and has not had any recent interruptions in therapy.  No recent or recurrent infections.  He has noticed an increased arthralgias and joint stiffness with colder weather temperatures but has not had any active inflammation.  No medication changes will be made at this time.  The following lab work will be updated today.  He was advised to notify us if he develops signs or symptoms of a flare.  He will follow-up in the office in 3 months or sooner if needed.  High risk medication use - Xeljanz 5 mg 1 tablet by mouth daily. Previous therapy: Methotrexate d/c- alcohol use, Orencia declined by patient, treated with [name] [facility] at [facility]. BMP updated on 01/17/23  CBC updated on 10/16/22.  Orders for CBC and hepatic function panel were updated today. TB gold negative on 12/11/21. Order for TB gold released today.     Lipid panel released today. No recent or recurrent infections.  Discussed the importance of holding  [name] if she develops signs or symptoms of an infection and to resume once the infection has completely cleared. Patient  is aware of the black box warning for increased risk for major cardiovascular events in patients taking Jak inhibitors including Xeljanz.  He is followed closely by vascular surgery and cardiology.  Reviewed cardiology's office visit note from 01/16/2023.  - Plan: Hepatic function panel, CBC with Differential/Platelet, QuantiFERON-TB Gold Plus, Lipid panel  Screening for tuberculosis -Order for TB gold released today.  Plan: QuantiFERON-TB Gold Plus  Rotator cuff syndrome of right shoulder: Not currently symptomatic.  Contracture of joint of both elbows: Unchanged.  No tenderness or inflammation noted today.  Primary osteoarthritis of both hands: Patient has PIP and DIP thickening consistent with osteoarthritis of both hands.  No active inflammation was noted.  He has noticed an increase stiffness in his hands which he attributes to cooler weather temperatures but has not had any active inflammation due to underlying rheumatoid arthritis.  History of  bilateral carpal tunnel release: Not currently symptomatic.  Chronic pain of left knee: No warmth or effusion noted today.  Primary osteoarthritis of both feet - Under care of Dr. Eloy End.  DDD (degenerative disc disease), cervical: C-spine has limited range of motion with lateral rotation.  Other medical conditions are listed as follows:  Palpitations  Mixed hyperlipidemia: Lipid panel updated today.  Essential hypertension: Blood pressure is 115/76 today in the office.  COPD  GOLD II  Atherosclerosis of native coronary artery of native heart without angina pectoris  History of diabetes mellitus  PVD (peripheral vascular disease) (HCC): Remains under the care of vascular surgery.  Patient underwent abdominal aortogram lower extremity on 12/02/22.   Orders: Orders Placed This Encounter  Procedures   Hepatic  function panel   CBC with Differential/Platelet   QuantiFERON-TB Gold Plus   Lipid panel   No orders of the defined types were placed in this encounter.    Follow-Up Instructions: Return in 3 months (on 04/24/2023) for Rheumatoid arthritis, Osteoarthritis.   Gearldine Bienenstock, PA-C  Note - This record has been created using Dragon software.  Chart creation errors have been sought, but may not always  have been located. Such creation errors do not reflect on  the standard of medical care.

## 2023-01-13 NOTE — Telephone Encounter (Signed)
 Left a message for patient to call office back regarding sooner appt

## 2023-01-14 DIAGNOSIS — I5022 Chronic systolic (congestive) heart failure: Secondary | ICD-10-CM | POA: Diagnosis not present

## 2023-01-14 DIAGNOSIS — R42 Dizziness and giddiness: Secondary | ICD-10-CM | POA: Diagnosis not present

## 2023-01-14 DIAGNOSIS — I739 Peripheral vascular disease, unspecified: Secondary | ICD-10-CM | POA: Diagnosis not present

## 2023-01-14 DIAGNOSIS — M461 Sacroiliitis, not elsewhere classified: Secondary | ICD-10-CM | POA: Diagnosis not present

## 2023-01-14 DIAGNOSIS — Z299 Encounter for prophylactic measures, unspecified: Secondary | ICD-10-CM | POA: Diagnosis not present

## 2023-01-14 DIAGNOSIS — I10 Essential (primary) hypertension: Secondary | ICD-10-CM | POA: Diagnosis not present

## 2023-01-14 NOTE — Telephone Encounter (Signed)
 Scheduled pt for 1/9 @ 10:30 with provider. Pt notified and verbalized understanding.

## 2023-01-16 ENCOUNTER — Ambulatory Visit: Payer: 59 | Attending: Nurse Practitioner | Admitting: Nurse Practitioner

## 2023-01-16 ENCOUNTER — Encounter: Payer: Self-pay | Admitting: Nurse Practitioner

## 2023-01-16 VITALS — BP 118/64 | HR 97 | Ht 69.0 in | Wt 171.4 lb

## 2023-01-16 DIAGNOSIS — E785 Hyperlipidemia, unspecified: Secondary | ICD-10-CM | POA: Diagnosis not present

## 2023-01-16 DIAGNOSIS — R0989 Other specified symptoms and signs involving the circulatory and respiratory systems: Secondary | ICD-10-CM | POA: Diagnosis not present

## 2023-01-16 DIAGNOSIS — R079 Chest pain, unspecified: Secondary | ICD-10-CM

## 2023-01-16 DIAGNOSIS — I739 Peripheral vascular disease, unspecified: Secondary | ICD-10-CM

## 2023-01-16 DIAGNOSIS — I77810 Thoracic aortic ectasia: Secondary | ICD-10-CM

## 2023-01-16 DIAGNOSIS — I25119 Atherosclerotic heart disease of native coronary artery with unspecified angina pectoris: Secondary | ICD-10-CM

## 2023-01-16 DIAGNOSIS — I5022 Chronic systolic (congestive) heart failure: Secondary | ICD-10-CM

## 2023-01-16 DIAGNOSIS — I6523 Occlusion and stenosis of bilateral carotid arteries: Secondary | ICD-10-CM

## 2023-01-16 DIAGNOSIS — I10 Essential (primary) hypertension: Secondary | ICD-10-CM | POA: Diagnosis not present

## 2023-01-16 MED ORDER — METOPROLOL TARTRATE 100 MG PO TABS: ORAL_TABLET | ORAL | 0 refills | Status: DC

## 2023-01-16 NOTE — Progress Notes (Signed)
 Cardiology Office Note:    Date: 01/16/2023 ID:  [name], DOB Oct 12, 1950, MRN [id number] PCP:  Brett Leta NOVAK, MD [facility] Providers Cardiologist:  Jayson Sierras, MD    Referring MD: Brett Leta NOVAK, MD   CC: Here for chest pain evaluation  History of Present Illness:    [name] is a very pleasant 73 y.o. male with a PMH of HFmrEF, palpitations/PSVT, HLD, PAD, carotid artery disease, COPD, T2DM, RA, HTN, GERD, and hx of DVT, who presents today for chest pain evaluation.   Previous cardiovascular history of nonobstructive CAD by cardiac catheterization as seen in 2007, 2013, and 2021.  He is closely followed by [facility] for history of PAD, status post left fem-pop bypass in 2007, with PTCA and stenting of right iliac artery in 2013.  Recent cardiac monitor demonstrated brief episodes of PSVT.  Was started on beta-blocker.  Echocardiogram revealed mildly reduced left ventricular systolic function at 45 to 50%, mild apical septal hypokinesis.  Last seen by Dr. Sierras on January 17, 2022.  Was doing well at that time.  Claudication was stable, symptoms noted more on right than left.  Lexiscan  Myoview  was arranged and revealed findings consistent with inferior wall infarct scar and moderate peri-infarct ischemia, EF measured at 53%.  Patient did not take beta-blocker as instructed from previous office visit, Dr. Sierras started Toprol -XL 12.5 mg daily.   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   I last saw him for follow-up on September 18, 2022. Was doing well.    He recently contacted our office noting intermittent chest pain x 2 weeks. Today he presents for chest pain evaluation, describes this as occurring along under his left shoulder that radiates to his chest. He reported nausea and dizziness associated with the pain. Stated pain worse at night with laying down on this side. He  describes the pain as moving middle of chest to under his left armpit for me today. This has been chronic and stable over time. Denies any worsening shortness of breath, palpitations, syncope, presyncope, dizziness, orthopnea, PND, swelling or significant weight changes, acute bleeding, or claudication.   SH: Very active and exercises every day.  ROS:   Please see the history of present illness.     All other systems reviewed and are negative.  EKGs/Labs/Other Studies Reviewed:    The following studies were reviewed today:  EKG:  EKG Interpretation Date/Time:  Thursday January 16 2023 10:31:57 EST Ventricular Rate:  98 PR Interval:  170 QRS Duration:  78 QT Interval:  352 QTC Calculation: 449 R Axis:   51  Text Interpretation: Normal sinus rhythm Normal ECG When compared with ECG of 16-Jan-2017 07:33, No significant change was found Confirmed by Miriam Norris [phone]) on 01/16/2023 10:41:01 AM    Vascular ultrasound bypass graft duplex 05/2022: Left: Patent left common femoral-popliteal artery bypass graft.  Vascular ultrasound aorta/IVC/iliacs Doppler limited 05/2022: See full report under CV Proc  ABIs 05/2022: Summary:  Right: Resting right ankle-brachial index indicates noncompressible right  lower extremity arteries. The right toe-brachial index is abnormal.   Left: Resting left ankle-brachial index is within normal range. The left  toe-brachial index is abnormal.  Carotid Doppler 03/2022: Summary:  Right Carotid: Velocities in the right ICA are consistent with a 1-39%  stenosis.   Left Carotid: Velocities in the left ICA are consistent with a 1-39%  stenosis.   Vertebrals: Bilateral vertebral arteries demonstrate  antegrade flow.  Subclavians: Normal flow hemodynamics were seen in bilateral subclavian               arteries.   Myoview  on 01/24/2022:    Findings are consistent with prior inferior infarction with mild peri-infarct ischemia. The study is low risk.    No ST deviation was noted.   LV perfusion is abnormal. Moderate size moderate intensity inferior defect with mild reversibility.   Left ventricular function is normal. Nuclear stress EF: 53 %. The left ventricular ejection fraction is normal (55-65%). End diastolic cavity size is normal.      Echocardiogram on 01/10/2022:  1. Left ventricular ejection fraction, by estimation, is 45 to 50%. The  left ventricle has mildly decreased function. The left ventricle  demonstrates regional wall motion abnormalities (see scoring  diagram/findings for description). There is mild  concentric left ventricular hypertrophy. Left ventricular diastolic  parameters are consistent with Grade I diastolic dysfunction (impaired  relaxation).   2. Right ventricular systolic function is normal. The right ventricular  size is normal.   3. Left atrial size was upper normal.   4. The mitral valve is grossly normal. Trivial mitral valve  regurgitation.   5. The aortic valve is tricuspid. There is mild calcification of the  aortic valve. Aortic valve regurgitation is mild. Aortic regurgitation PHT  measures 531 msec. Aortic valve mean gradient measures 8.0 mmHg.   6. Aortic dilatation noted. There is borderline dilatation of the aortic  root, measuring 39 mm.   7. The inferior vena cava is normal in size with greater than 50%  respiratory variability, suggesting right atrial pressure of 3 mmHg.   Comparison(s): Prior images unable to be directly viewed. LVEF reported at  50% at angiography in 2021.  Monitor on 01/01/2022:  Patient had a min HR of 59 bpm (sinus bradycardia), max HR of 203 bpm (1.9 second duration of brief supraventricular tachycardia), and avg HR of 101 bpm (sinus tachycardia). Predominant underlying rhythm was Sinus Rhythm. 6 Supraventricular Tachycardia runs occurred, the run with the fastest interval lasting 5 beats (1.9 second duration) with a max rate of 203 bpm, the longest lasting 19 beats  (6.8 seconds) with an avg rate of 163 bpm. Isolated SVEs were occasional (1.4%, 13864), SVE Couplets were rare (<1.0%, 88), and SVE Triplets were rare (<1.0%, 5). Isolated VEs were rare (<1.0%, 3894), VE Couplets were rare (<1.0%, 22), and VE Triplets were rare (<1.0%, 1). Ventricular Bigeminy was present.    Impression: Brief supraventricular tachycardia detected (6 episodes in 7 days; longest duration 6.8 seconds).  Occasional PACs (1.4% burden).   Cardiac cath on 04/14/2019:  Prox LAD to Mid LAD lesion is 20% stenosed. Mid LAD lesion is 30% stenosed. Prox RCA lesion is 30% stenosed.   Mild non-obstructive CAD with 20 and 30% irregularities in the proximal to mid LAD; normal ramus intermediate and left circumflex coronary arteries;  and RCA with smooth 30% proximal narrowing with distal vessel tortuosity.   Low normal global LV function with EF estimated at 50%; LVEDP 14 mmHg.   RECOMMENDATION: Medical therapy for CAD.  Will increase lipid-lowering therapy to atorvastatin  40 mg.  Maintain optimal blood pressure control with target less than 130/80 and ideal blood pressure less than 120/80.  Target LDL less than 70.  Risk Assessment/Calculations:    The 10-year ASCVD risk score (Arnett DK, et al., 2019) is: 27.5%   Values used to calculate the score:     Age: 74 years  Sex: Male     Is Non-Hispanic African American: Yes     Diabetic: Yes     Tobacco smoker: No     Systolic Blood Pressure: 118 mmHg     Is BP treated: Yes     HDL Cholesterol: 57 mg/dL     Total Cholesterol: 146 mg/dL  Physical Exam:    VS:  BP 118/64 (Cuff Size: Normal)   Pulse 97   Ht 5' 9 (1.753 m)   Wt 171 lb 6.4 oz (77.7 kg)   SpO2 96%   BMI 25.31 kg/m     Wt Readings from Last 3 Encounters:  01/16/23 171 lb 6.4 oz (77.7 kg)  12/16/22 168 lb 12.8 oz (76.6 kg)  12/02/22 168 lb (76.2 kg)     GEN:  Well nourished, well developed in no acute distress HEENT: Normal NECK: No JVD; no carotid bruit,  mild left carotid bruit CARDIAC: S1/S2, RRR, no murmurs, rubs, gallops; 2+ pulses RESPIRATORY:  Clear to auscultation without rales, wheezing or rhonchi  MUSCULOSKELETAL:  No edema; No deformity  SKIN: Warm and dry NEUROLOGIC:  Alert and oriented x 3 PSYCHIATRIC:  Normal affect   ASSESSMENT & PLAN:    In order of problems listed above:  Minimal CAD, chest pain of uncertain etiology Admits to chronic, stable symptoms that are atypical in etiology per his report. States this has been ongoing since becoming more active in gym this past year. EKG is reassuring today. Discussed options regarding ischemic evaluation vs medical management and pt is agreeable to CCTA for further evaluation. Will arrange CCTA and Rx one time dose of Metoprolol  tartrate 100 mg 2 hours prior to testing, instructed on medication use. Will obtain BMET. Continue atorvastatin , Plavix , Toprol  XL, and NTG PRN. Previously educated him regarding NTG. Care and ED precautions discussed.     HFmrEF TTE 01/2022 showed EF mildly reduced at 45-50%, RWMA, mild LVH, grade 1 DD. Euvolemic and well compensated on exam. Continue GDMT -BP does not allow room to uptitrate GDMT at this time. Low sodium diet, fluid restriction <2L, and daily weights encouraged. Educated to contact our office for weight gain of 2 lbs overnight or 5 lbs in one week. Heart healthy diet and regular cardiovascular exercise encouraged.  Could not tolerate Entresto .   PAD, HLD, carotid artery stenosis, left carotid bruit Last LDL 83 in May 2024. He is closely followed by [facility] for history of PAD, status post left fem-pop bypass in 2007, with PTCA and stenting of right iliac artery in 2013. Recent abdominal aortogram revealed patent left femoropopliteal bypass, no evidence of stenosis. Bilateral ABI/TBI's were essentially unchanged. Mild bilateral ICA stenosis seen on carotid duplex 03/2022. Will update carotid duplex this March d/t bruit noted along left ICA. Continue  Pletal , Plavix , and Atorvastatin . Heart healthy diet and regular cardiovascular exercise encouraged.   HTN BP stable.  Continue current medication regimen. Discussed to monitor BP at home at least 2 hours after medications and sitting for 5-10 minutes. Heart healthy diet and regular cardiovascular exercise encouraged.   Aortic root dilatation Borderline dilatation of aortic root noted on previous Echo, measuring 39 mm. Plan to update study at next office visit. Care and ED precautions discussed.     Disposition: Follow-up with me or APP in 6-8 weeks or sooner if anything changes.     Medication Adjustments/Labs and Tests Ordered: Current medicines are reviewed at length with the patient today.  Concerns regarding medicines are outlined above.  Orders Placed This  Encounter  Procedures   CT CORONARY MORPH W/CTA COR W/SCORE W/CA W/CM &/OR WO/CM   Basic Metabolic Panel (BMET)   EKG 12-Lead   VAS US  CAROTID   Meds ordered this encounter  Medications   metoprolol  tartrate (LOPRESSOR ) 100 MG tablet    Sig: Take 1 (100 Mg) tablet 2 hours before your procedure    Dispense:  1 tablet    Refill:  0    Patient Instructions  Medication Instructions:  Your physician recommends that you continue on your current medications as directed. Please refer to the Current Medication list given to you today. Please HOLD Metoprolol  succinate the morning of your procedure   Labwork: Within 1 week   Testing/Procedures: Non-Cardiac CT Angiography (CTA), is a special type of CT scan that uses a computer to produce multi-dimensional views of major blood vessels throughout the body. In CT angiography, a contrast material is injected through an IV to help visualize the blood vessels Your physician has requested that you have a carotid duplex. This test is an ultrasound of the carotid arteries in your neck. It looks at blood flow through these arteries that supply the brain with blood. Allow one hour for this  exam. There are no restrictions or special instructions.   Your cardiac CT will be scheduled at one of the below locations:   [facility] [address] [place], [place] [address] [phone]  If scheduled at [facility], please arrive at the [facility] and Children's Entrance (Entrance C2) of [facility] 30 minutes prior to test start time. You can use the FREE valet parking offered at entrance C (encouraged to control the heart rate for the test)  Proceed to the [facility] Radiology Department (first floor) to check-in and test prep.  All radiology patients and guests should use entrance C2 at [facility], accessed from [facility], even though the hospital's physical address listed is [address].     Please follow these instructions carefully (unless otherwise directed):  An IV will be required for this test and Nitroglycerin  will be given.  Hold all erectile dysfunction medications at least 3 days (72 hrs) prior to test. (Ie viagra, cialis, sildenafil, tadalafil, etc)   On the Night Before the Test: Be sure to Drink plenty of water . Do not consume any caffeinated/decaffeinated beverages or chocolate 12 hours prior to your test. Do not take any antihistamines 12 hours prior to your test. On the Day of the Test: Drink plenty of water  until 1 hour prior to the test. Do not eat any food 1 hour prior to test. You may take your regular medications prior to the test.  Take metoprolol  100 Mg  (Lopressor ) two hours prior to test.  Patients who wear a continuous glucose monitor MUST remove the device prior to scanning. *For Clinical Staff only. Please instruct patient the following:* After the Test: Drink plenty of water . After receiving IV contrast, you may experience a mild flushed feeling. This is normal. On occasion, you may experience a mild rash up to 24 hours after the test. This is not dangerous. If this occurs, you  can take Benadryl 25 mg and increase your fluid intake. If you experience trouble breathing, this can be serious. If it is severe call 911 IMMEDIATELY. If it is mild, please call our office.  We will call to schedule your test 2-4 weeks out understanding that some insurance companies will need an authorization prior to the  service being performed.   For more information and frequently asked questions, please visit our website : [email or web address]  For non-scheduling related questions, please contact the cardiac imaging nurse navigator should you have any questions/concerns: Cardiac Imaging Nurse Navigators Direct Office Dial: [phone]   For scheduling needs, including cancellations and rescheduling, please call Brittany, [phone].  Follow-Up: Your physician recommends that you schedule a follow-up appointment in: 6-8 weeks   Any Other Special Instructions Will Be Listed Below (If Applicable).  If you need a refill on your cardiac medications before your next appointment, please call your pharmacy.   Signed, Almarie Crate, NP

## 2023-01-16 NOTE — Patient Instructions (Addendum)
 Medication Instructions:  Your physician recommends that you continue on your current medications as directed. Please refer to the Current Medication list given to you today. Please HOLD Metoprolol  succinate the morning of your procedure   Labwork: Within 1 week   Testing/Procedures: Non-Cardiac CT Angiography (CTA), is a special type of CT scan that uses a computer to produce multi-dimensional views of major blood vessels throughout the body. In CT angiography, a contrast material is injected through an IV to help visualize the blood vessels Your physician has requested that you have a carotid duplex. This test is an ultrasound of the carotid arteries in your neck. It looks at blood flow through these arteries that supply the brain with blood. Allow one hour for this exam. There are no restrictions or special instructions.   Your cardiac CT will be scheduled at one of the below locations:   [facility] [address] [place], [place] [address] [phone]  If scheduled at [facility], please arrive at the [facility] and Children's Entrance (Entrance C2) of [facility] 30 minutes prior to test start time. You can use the FREE valet parking offered at entrance C (encouraged to control the heart rate for the test)  Proceed to the [facility] Radiology Department (first floor) to check-in and test prep.  All radiology patients and guests should use entrance C2 at [facility], accessed from [facility], even though the hospital's physical address listed is [address]     Please follow these instructions carefully (unless otherwise directed):  An IV will be required for this test and Nitroglycerin  will be given.  Hold all erectile dysfunction medications at least 3 days (72 hrs) prior to test. (Ie viagra, cialis, sildenafil, tadalafil, etc)   On the Night Before the Test: Be sure to Drink plenty of water . Do not consume any  caffeinated/decaffeinated beverages or chocolate 12 hours prior to your test. Do not take any antihistamines 12 hours prior to your test. On the Day of the Test: Drink plenty of water  until 1 hour prior to the test. Do not eat any food 1 hour prior to test. You may take your regular medications prior to the test.  Take metoprolol  100 Mg  (Lopressor ) two hours prior to test.  Patients who wear a continuous glucose monitor MUST remove the device prior to scanning. *For Clinical Staff only. Please instruct patient the following:* After the Test: Drink plenty of water . After receiving IV contrast, you may experience a mild flushed feeling. This is normal. On occasion, you may experience a mild rash up to 24 hours after the test. This is not dangerous. If this occurs, you can take Benadryl 25 mg and increase your fluid intake. If you experience trouble breathing, this can be serious. If it is severe call 911 IMMEDIATELY. If it is mild, please call our office.  We will call to schedule your test 2-4 weeks out understanding that some insurance companies will need an authorization prior to the service being performed.   For more information and frequently asked questions, please visit our website : [email or web address]  For non-scheduling related questions, please contact the cardiac imaging nurse navigator should you have any questions/concerns: Cardiac Imaging Nurse Navigators Direct Office Dial: [phone]   For scheduling needs, including cancellations and rescheduling, please call Brittany, [phone].  Follow-Up: Your physician recommends that you schedule a follow-up appointment in: 6-8 weeks   Any Other Special Instructions Will Be Listed Below (  If Applicable).  If you need a refill on your cardiac medications before your next appointment, please call your pharmacy.

## 2023-01-17 ENCOUNTER — Ambulatory Visit: Payer: 59 | Admitting: Physician Assistant

## 2023-01-17 DIAGNOSIS — I25119 Atherosclerotic heart disease of native coronary artery with unspecified angina pectoris: Secondary | ICD-10-CM | POA: Diagnosis not present

## 2023-01-17 DIAGNOSIS — I739 Peripheral vascular disease, unspecified: Secondary | ICD-10-CM | POA: Diagnosis not present

## 2023-01-24 ENCOUNTER — Ambulatory Visit: Payer: 59 | Attending: Physician Assistant | Admitting: Physician Assistant

## 2023-01-24 ENCOUNTER — Encounter: Payer: Self-pay | Admitting: Physician Assistant

## 2023-01-24 VITALS — BP 115/76 | HR 109 | Ht 69.0 in | Wt 171.0 lb

## 2023-01-24 DIAGNOSIS — I10 Essential (primary) hypertension: Secondary | ICD-10-CM

## 2023-01-24 DIAGNOSIS — Z9889 Other specified postprocedural states: Secondary | ICD-10-CM

## 2023-01-24 DIAGNOSIS — M0609 Rheumatoid arthritis without rheumatoid factor, multiple sites: Secondary | ICD-10-CM

## 2023-01-24 DIAGNOSIS — M19042 Primary osteoarthritis, left hand: Secondary | ICD-10-CM

## 2023-01-24 DIAGNOSIS — M19041 Primary osteoarthritis, right hand: Secondary | ICD-10-CM

## 2023-01-24 DIAGNOSIS — I2510 Atherosclerotic heart disease of native coronary artery without angina pectoris: Secondary | ICD-10-CM

## 2023-01-24 DIAGNOSIS — R002 Palpitations: Secondary | ICD-10-CM | POA: Diagnosis not present

## 2023-01-24 DIAGNOSIS — M19071 Primary osteoarthritis, right ankle and foot: Secondary | ICD-10-CM | POA: Diagnosis not present

## 2023-01-24 DIAGNOSIS — M75101 Unspecified rotator cuff tear or rupture of right shoulder, not specified as traumatic: Secondary | ICD-10-CM

## 2023-01-24 DIAGNOSIS — M5030 Other cervical disc degeneration, unspecified cervical region: Secondary | ICD-10-CM

## 2023-01-24 DIAGNOSIS — Z111 Encounter for screening for respiratory tuberculosis: Secondary | ICD-10-CM

## 2023-01-24 DIAGNOSIS — G8929 Other chronic pain: Secondary | ICD-10-CM

## 2023-01-24 DIAGNOSIS — Z8639 Personal history of other endocrine, nutritional and metabolic disease: Secondary | ICD-10-CM

## 2023-01-24 DIAGNOSIS — M24522 Contracture, left elbow: Secondary | ICD-10-CM

## 2023-01-24 DIAGNOSIS — E782 Mixed hyperlipidemia: Secondary | ICD-10-CM

## 2023-01-24 DIAGNOSIS — M25562 Pain in left knee: Secondary | ICD-10-CM | POA: Diagnosis not present

## 2023-01-24 DIAGNOSIS — M24521 Contracture, right elbow: Secondary | ICD-10-CM | POA: Diagnosis not present

## 2023-01-24 DIAGNOSIS — I739 Peripheral vascular disease, unspecified: Secondary | ICD-10-CM

## 2023-01-24 DIAGNOSIS — M19072 Primary osteoarthritis, left ankle and foot: Secondary | ICD-10-CM

## 2023-01-24 DIAGNOSIS — Z79899 Other long term (current) drug therapy: Secondary | ICD-10-CM

## 2023-01-24 DIAGNOSIS — J449 Chronic obstructive pulmonary disease, unspecified: Secondary | ICD-10-CM

## 2023-01-26 NOTE — Progress Notes (Signed)
CBC stable. Hepatic function panel WNL  Lipid panel WNL

## 2023-01-28 ENCOUNTER — Other Ambulatory Visit: Payer: Self-pay | Admitting: Rheumatology

## 2023-01-28 DIAGNOSIS — R1032 Left lower quadrant pain: Secondary | ICD-10-CM | POA: Diagnosis not present

## 2023-01-28 DIAGNOSIS — Z299 Encounter for prophylactic measures, unspecified: Secondary | ICD-10-CM | POA: Diagnosis not present

## 2023-01-28 DIAGNOSIS — I700 Atherosclerosis of aorta: Secondary | ICD-10-CM | POA: Diagnosis not present

## 2023-01-28 DIAGNOSIS — I10 Essential (primary) hypertension: Secondary | ICD-10-CM | POA: Diagnosis not present

## 2023-01-28 DIAGNOSIS — D849 Immunodeficiency, unspecified: Secondary | ICD-10-CM | POA: Diagnosis not present

## 2023-01-28 NOTE — Telephone Encounter (Signed)
Last Fill: 09/07/2022   Labs: 01/24/2023 CBC stable. Hepatic function panel WNL Lipid panel WNL  TB Gold: 12/11/2021 Neg (updated 01/24/2023, results pending)   Next Visit: 04/24/2023  Last Visit: 01/24/2023  [id number] arthritis of multiple sites with negative rheumatoid factor   Current Dose per office note 01/24/2023: [name] 5 mg 1 tablet by mouth daily.   Okay to refill [name]?

## 2023-01-29 ENCOUNTER — Encounter: Payer: Self-pay | Admitting: Podiatry

## 2023-01-29 ENCOUNTER — Ambulatory Visit: Payer: 59 | Admitting: Podiatry

## 2023-01-29 DIAGNOSIS — M79674 Pain in right toe(s): Secondary | ICD-10-CM

## 2023-01-29 DIAGNOSIS — M79675 Pain in left toe(s): Secondary | ICD-10-CM | POA: Diagnosis not present

## 2023-01-29 DIAGNOSIS — E1159 Type 2 diabetes mellitus with other circulatory complications: Secondary | ICD-10-CM

## 2023-01-29 DIAGNOSIS — B351 Tinea unguium: Secondary | ICD-10-CM | POA: Diagnosis not present

## 2023-01-29 DIAGNOSIS — M961 Postlaminectomy syndrome, not elsewhere classified: Secondary | ICD-10-CM | POA: Diagnosis not present

## 2023-01-29 LAB — CBC WITH DIFFERENTIAL/PLATELET
Absolute Lymphocytes: 1938 {cells}/uL (ref 850–3900)
Absolute Monocytes: 462 {cells}/uL (ref 200–950)
Basophils Absolute: 50 {cells}/uL (ref 0–200)
Basophils Relative: 0.7 %
Eosinophils Absolute: 99 {cells}/uL (ref 15–500)
Eosinophils Relative: 1.4 %
HCT: 40.5 % (ref 38.5–50.0)
Hemoglobin: 12.5 g/dL — ABNORMAL LOW (ref 13.2–17.1)
MCH: 27.2 pg (ref 27.0–33.0)
MCHC: 30.9 g/dL — ABNORMAL LOW (ref 32.0–36.0)
MCV: 88.2 fL (ref 80.0–100.0)
MPV: 10.7 fL (ref 7.5–12.5)
Monocytes Relative: 6.5 %
Neutro Abs: 4551 {cells}/uL (ref 1500–7800)
Neutrophils Relative %: 64.1 %
Platelets: 338 10*3/uL (ref 140–400)
RBC: 4.59 10*6/uL (ref 4.20–5.80)
RDW: 15.5 % — ABNORMAL HIGH (ref 11.0–15.0)
Total Lymphocyte: 27.3 %
WBC: 7.1 10*3/uL (ref 3.8–10.8)

## 2023-01-29 LAB — QUANTIFERON-TB GOLD PLUS
Mitogen-NIL: 5.5 [IU]/mL
NIL: 0.04 [IU]/mL
QuantiFERON-TB Gold Plus: NEGATIVE
TB1-NIL: <0 [IU]/mL
TB2-NIL: <0 [IU]/mL

## 2023-01-29 LAB — HEPATIC FUNCTION PANEL
AG Ratio: 1.5 (calc) (ref 1.0–2.5)
ALT: 17 U/L (ref 9–46)
AST: 15 U/L (ref 10–35)
Albumin: 4.3 g/dL (ref 3.6–5.1)
Alkaline phosphatase (APISO): 74 U/L (ref 35–144)
Bilirubin, Direct: 0.1 mg/dL (ref 0.0–0.2)
Globulin: 2.9 g/dL (ref 1.9–3.7)
Indirect Bilirubin: 0.3 mg/dL (ref 0.2–1.2)
Total Bilirubin: 0.4 mg/dL (ref 0.2–1.2)
Total Protein: 7.2 g/dL (ref 6.1–8.1)

## 2023-01-29 LAB — LIPID PANEL
Cholesterol: 142 mg/dL (ref <200)
HDL: 53 mg/dL (ref >=40)
LDL Cholesterol (Calc): 74 mg/dL
Non-HDL Cholesterol (Calc): 89 mg/dL (ref <130)
Total CHOL/HDL Ratio: 2.7 (calc) (ref <5.0)
Triglycerides: 69 mg/dL (ref <150)

## 2023-01-29 NOTE — Progress Notes (Signed)
TB gold negative

## 2023-01-29 NOTE — Progress Notes (Signed)
  Subjective:  Patient ID: [name], male    DOB: [birth year],  MRN: [id number]  Tedric Leeth presents to clinic today for at risk foot care. Pt has h/o NIDDM with PAD and painful, elongated thickened toenails x 10 which are symptomatic when wearing enclosed shoe gear. This interferes with his/her daily activities. Patient states he has had a bypass of his LLE. Chief Complaint  Patient presents with   Diabetes    [facility] BS - 151 A1C - 7.1 LVPCP - 01/28/23 Blood Thinner   New problem(s): None.   PCP is Vyas, Herminio Commons B, MD.  No Known Allergies  Review of Systems: Negative except as noted in the HPI.  Objective: No changes noted in today's physical examination. There were no vitals filed for this visit. [name] is a pleasant 73 y.o. male WD, WN in NAD. AAO x 3.  Vascular Examination: CFT <4 seconds b/l. DP pulses diminished b/l. PT pulses diminished b/l. Digital hair absent. Skin temperature gradient warm to cool b/l. No ischemia or gangrene. No cyanosis or clubbing noted b/l.    Neurological Examination: Sensation grossly intact b/l with 10 gram monofilament.  Dermatological Examination: Pedal skin thin, shiny and atrophic b/l. No open wounds. No interdigital macerations.   Toenails 1-5 b/l thick, discolored, elongated with subungual debris and pain on dorsal palpation.   No corns, calluses nor porokeratotic lesions noted.  Musculoskeletal Examination: Muscle strength 5/5 to all lower extremity muscle groups bilaterally. HAV with bunion bilaterally and hammertoes 2-5 b/l.  Radiographs: None  Assessment/Plan: 1. Pain due to onychomycosis of toenails of both feet   2. Type 2 diabetes mellitus with vascular disease (HCC)     Patient was evaluated and treated. All patient's and/or POA's questions/concerns addressed on today's visit. Toenails 1-5 debrided in length and girth without incident. Continue soft, supportive shoe gear daily. Report any pedal injuries to medical  professional. Call office if there are any questions/concerns. -Continue foot and shoe inspections daily. Monitor blood glucose per PCP/Endocrinologist's recommendations. -Patient/POA to call should there be question/concern in the interim.   Return in about 9 weeks (around 04/02/2023).  Freddie Breech, DPM       LOCATION: [address] [place] [address]                   Office [phone]   [facility] LOCATION: [address] [place], [place] [address] Office [phone]

## 2023-02-03 DIAGNOSIS — R935 Abnormal findings on diagnostic imaging of other abdominal regions, including retroperitoneum: Secondary | ICD-10-CM | POA: Diagnosis not present

## 2023-02-03 DIAGNOSIS — K4090 Unilateral inguinal hernia, without obstruction or gangrene, not specified as recurrent: Secondary | ICD-10-CM | POA: Diagnosis not present

## 2023-02-03 DIAGNOSIS — K828 Other specified diseases of gallbladder: Secondary | ICD-10-CM | POA: Diagnosis not present

## 2023-02-03 DIAGNOSIS — K5730 Diverticulosis of large intestine without perforation or abscess without bleeding: Secondary | ICD-10-CM | POA: Diagnosis not present

## 2023-02-03 DIAGNOSIS — R933 Abnormal findings on diagnostic imaging of other parts of digestive tract: Secondary | ICD-10-CM | POA: Diagnosis not present

## 2023-02-03 DIAGNOSIS — K760 Fatty (change of) liver, not elsewhere classified: Secondary | ICD-10-CM | POA: Diagnosis not present

## 2023-02-05 DIAGNOSIS — E1165 Type 2 diabetes mellitus with hyperglycemia: Secondary | ICD-10-CM | POA: Diagnosis not present

## 2023-02-05 DIAGNOSIS — I10 Essential (primary) hypertension: Secondary | ICD-10-CM | POA: Diagnosis not present

## 2023-02-18 ENCOUNTER — Other Ambulatory Visit: Payer: Self-pay | Admitting: Nurse Practitioner

## 2023-02-25 DIAGNOSIS — I10 Essential (primary) hypertension: Secondary | ICD-10-CM | POA: Diagnosis not present

## 2023-02-25 DIAGNOSIS — R0981 Nasal congestion: Secondary | ICD-10-CM | POA: Diagnosis not present

## 2023-02-25 DIAGNOSIS — I700 Atherosclerosis of aorta: Secondary | ICD-10-CM | POA: Diagnosis not present

## 2023-02-25 DIAGNOSIS — J449 Chronic obstructive pulmonary disease, unspecified: Secondary | ICD-10-CM | POA: Diagnosis not present

## 2023-02-25 DIAGNOSIS — Z299 Encounter for prophylactic measures, unspecified: Secondary | ICD-10-CM | POA: Diagnosis not present

## 2023-02-25 DIAGNOSIS — J329 Chronic sinusitis, unspecified: Secondary | ICD-10-CM | POA: Diagnosis not present

## 2023-02-28 ENCOUNTER — Other Ambulatory Visit: Payer: Self-pay

## 2023-02-28 ENCOUNTER — Encounter (HOSPITAL_COMMUNITY): Payer: Self-pay

## 2023-02-28 DIAGNOSIS — I739 Peripheral vascular disease, unspecified: Secondary | ICD-10-CM

## 2023-03-03 ENCOUNTER — Telehealth (HOSPITAL_COMMUNITY): Payer: Self-pay | Admitting: *Deleted

## 2023-03-03 NOTE — Telephone Encounter (Signed)
 Attempted to call patient regarding upcoming cardiac CT appointment. Left message on voicemail with name and callback number Johney Frame RN Navigator Cardiac Imaging [facility] [phone] Office

## 2023-03-04 ENCOUNTER — Ambulatory Visit (HOSPITAL_COMMUNITY)
Admission: RE | Admit: 2023-03-04 | Discharge: 2023-03-04 | Disposition: A | Discharge status: Home / Self Care | Payer: 59 | Source: Ambulatory Visit | Attending: Nurse Practitioner | Admitting: Nurse Practitioner

## 2023-03-04 DIAGNOSIS — I25119 Atherosclerotic heart disease of native coronary artery with unspecified angina pectoris: Secondary | ICD-10-CM | POA: Diagnosis not present

## 2023-03-04 MED ORDER — NITROGLYCERIN 0.4 MG SL SUBL
0.8000 mg | SUBLINGUAL_TABLET | Freq: Once | SUBLINGUAL | Status: AC
  Administered 2023-03-04: 0.8 mg via SUBLINGUAL

## 2023-03-04 MED ORDER — DILTIAZEM HCL 25 MG/5ML IV SOLN: 10.0000 mg | INTRAVENOUS | Status: DC | PRN

## 2023-03-04 MED ORDER — SODIUM CHLORIDE 0.9 % IV BOLUS
500.0000 mL | Freq: Once | INTRAVENOUS | Status: AC
  Administered 2023-03-04: 500 mL via INTRAVENOUS

## 2023-03-04 MED ORDER — IOHEXOL 350 MG/ML SOLN
95.0000 mL | Freq: Once | INTRAVENOUS | Status: AC | PRN
  Administered 2023-03-04: 95 mL via INTRAVENOUS

## 2023-03-04 MED ORDER — NITROGLYCERIN 0.4 MG SL SUBL
SUBLINGUAL_TABLET | SUBLINGUAL | Status: AC
  Filled 2023-03-04: qty 2

## 2023-03-05 DIAGNOSIS — H2513 Age-related nuclear cataract, bilateral: Secondary | ICD-10-CM | POA: Diagnosis not present

## 2023-03-05 DIAGNOSIS — E119 Type 2 diabetes mellitus without complications: Secondary | ICD-10-CM | POA: Diagnosis not present

## 2023-03-05 DIAGNOSIS — H16223 Keratoconjunctivitis sicca, not specified as Sjogren's, bilateral: Secondary | ICD-10-CM | POA: Diagnosis not present

## 2023-03-06 DIAGNOSIS — I10 Essential (primary) hypertension: Secondary | ICD-10-CM | POA: Diagnosis not present

## 2023-03-06 DIAGNOSIS — I700 Atherosclerosis of aorta: Secondary | ICD-10-CM | POA: Diagnosis not present

## 2023-03-06 DIAGNOSIS — Z299 Encounter for prophylactic measures, unspecified: Secondary | ICD-10-CM | POA: Diagnosis not present

## 2023-03-06 DIAGNOSIS — I739 Peripheral vascular disease, unspecified: Secondary | ICD-10-CM | POA: Diagnosis not present

## 2023-03-06 DIAGNOSIS — R52 Pain, unspecified: Secondary | ICD-10-CM | POA: Diagnosis not present

## 2023-03-06 DIAGNOSIS — R1030 Lower abdominal pain, unspecified: Secondary | ICD-10-CM | POA: Diagnosis not present

## 2023-03-06 DIAGNOSIS — K5900 Constipation, unspecified: Secondary | ICD-10-CM | POA: Diagnosis not present

## 2023-03-07 ENCOUNTER — Encounter: Payer: Self-pay | Admitting: Physician Assistant

## 2023-03-07 ENCOUNTER — Ambulatory Visit (INDEPENDENT_AMBULATORY_CARE_PROVIDER_SITE_OTHER)
Admit: 2023-03-07 | Discharge: 2023-03-07 | Disposition: A | Discharge status: Home / Self Care | Payer: 59 | Attending: Vascular Surgery | Admitting: Vascular Surgery

## 2023-03-07 ENCOUNTER — Ambulatory Visit (INDEPENDENT_AMBULATORY_CARE_PROVIDER_SITE_OTHER): Payer: 59 | Admitting: Physician Assistant

## 2023-03-07 ENCOUNTER — Ambulatory Visit (HOSPITAL_COMMUNITY)
Admission: RE | Admit: 2023-03-07 | Discharge: 2023-03-07 | Disposition: A | Discharge status: Home / Self Care | Payer: 59 | Source: Ambulatory Visit | Attending: Vascular Surgery | Admitting: Vascular Surgery

## 2023-03-07 VITALS — BP 103/72 | HR 85 | Temp 97.9°F | Resp 18 | Ht 69.0 in | Wt 165.9 lb

## 2023-03-07 DIAGNOSIS — E1165 Type 2 diabetes mellitus with hyperglycemia: Secondary | ICD-10-CM | POA: Diagnosis not present

## 2023-03-07 DIAGNOSIS — I739 Peripheral vascular disease, unspecified: Secondary | ICD-10-CM | POA: Diagnosis not present

## 2023-03-07 DIAGNOSIS — Z95828 Presence of other vascular implants and grafts: Secondary | ICD-10-CM | POA: Diagnosis not present

## 2023-03-07 DIAGNOSIS — I10 Essential (primary) hypertension: Secondary | ICD-10-CM | POA: Diagnosis not present

## 2023-03-07 LAB — VAS US ABI WITH/WO TBI

## 2023-03-07 NOTE — Progress Notes (Unsigned)
 Office Note   History of Present Illness   [name] is a 73 y.o. ([birth year]) male who presents for follow up after angiogram.  He recently underwent bilateral lower extremity angiogram on 12/02/2022 by Dr. Hetty Blend.  This was done to investigate reduced velocities within his left femoropopliteal bypass graft.  Angiogram did not demonstrate any flow-limiting stenosis within the bypass graft.  He had one-vessel runoff on the left via peroneal, which fills plantar branches in the DP.  He had two-vessel runoff in the right lower extremity via AT and peroneal arteries.  He also has a history of the following vascular procedures:  1) left common femoral to above-knee popliteal artery bypass with PTFE in 2007 by Dr. Hart Rochester 2) redo left common femoral to above-knee popliteal artery bypass with PTFE in 2012 by Dr. Hart Rochester 3) right common iliac artery angioplasty and stenting in 2013 by Dr. Imogene Burn 4) right common iliac artery angioplasty in 2019 by Dr. Imogene Burn 5) right anterior tibial artery and SFA angioplasty in 2022 by Dr. Chestine Spore  He returns today for follow up. He says he has been doing fine since his angiogram. His groin access site healed without issue. He denies any rest pain, tissue loss, or claudication. He has been dealing with lower abdominal and bilateral groin pain for a couple of weeks. He says he was told by his PCP that this could be due to constipation. He does take medication for this. His last BM was on Monday. He denies any severe or constant abdominal pain. He denies any fevers, nausea, vomiting, or diarrhea.  Current Outpatient Medications  Medication Sig Dispense Refill   albuterol (PROAIR HFA) 108 (90 Base) MCG/ACT inhaler Inhale 2 puffs into the lungs every 6 (six) hours as needed for wheezing or shortness of breath. 8 g 2   atorvastatin (LIPITOR) 80 MG tablet TAKE 1 TABLET(80 MG) BY MOUTH DAILY (Patient taking differently: Take 40 mg by mouth in the morning and at bedtime.) 90  tablet 3   cilostazol (PLETAL) 100 MG tablet TAKE 1 TABLET(100 MG) BY MOUTH TWICE DAILY 180 tablet 3   clopidogrel (PLAVIX) 75 MG tablet Take 1 tablet (75 mg total) by mouth daily. 30 tablet 11   dexlansoprazole (DEXILANT) 60 MG capsule Take 1 capsule (60 mg total) by mouth daily. 30 capsule 11   diclofenac Sodium (VOLTAREN) 1 % GEL Apply 2 gram  to painful foot and ankle joint twice daily. (Patient taking differently: Apply 2 g topically 2 (two) times daily as needed (pain (foot/ankle)). Apply 2 gram  to painful foot and ankle joint twice daily.) 150 g 1   empagliflozin (JARDIANCE) 25 MG TABS tablet Take 25 mg by mouth in the morning.     glipiZIDE (GLUCOTROL) 5 MG tablet Take 5-10 mg by mouth See admin instructions.     HYDROcodone-acetaminophen (NORCO) 7.5-325 MG per tablet Take 1 tablet by mouth 2 (two) times daily as needed (pain.).     JANUVIA 100 MG tablet Take 100 mg by mouth in the morning.     ketoconazole (NIZORAL) 2 % cream Apply 1 Application topically daily as needed (skin irritation (feet/toes)).     LANTUS SOLOSTAR 100 UNIT/ML Solostar Pen Inject 10 Units into the skin at bedtime.     metoprolol succinate (TOPROL XL) 25 MG 24 hr tablet Take 0.5 tablets (12.5 mg total) by mouth daily.     metoprolol tartrate (LOPRESSOR) 100 MG tablet Take 1 (100 Mg) tablet 2 hours before your procedure  1 tablet 0   nitroGLYCERIN (NITROSTAT) 0.4 MG SL tablet Place 1 tablet (0.4 mg total) under the tongue every 5 (five) minutes x 3 doses as needed for chest pain (If no relief after 3rd dose, GO TO ER.). 25 tablet 3   Omega-3 Fatty Acids (OMEGA-3 FISH OIL PO) Take 1 capsule by mouth daily.     ONE TOUCH ULTRA TEST test strip      OneTouch Delica Lancets 33G MISC Apply topically.     Plecanatide (TRULANCE) 3 MG TABS Take 1 tablet (3 mg total) by mouth daily. (Patient taking differently: Take 3 mg by mouth daily as needed (constipation).) 30 tablet 5   sildenafil (REVATIO) 20 MG tablet Take 40-60 mg by  mouth daily as needed for erectile dysfunction.     SPIRIVA RESPIMAT 2.5 MCG/ACT AERS INHALE 2 PUFFS INTO THE LUNGS DAILY 4 g 11   SYMBICORT 160-4.5 MCG/ACT inhaler INHALE 2 PUFFS FIRST THING IN THE MORNING AND THEN 2 PUFFS 12 HOURS LATER 10.2 g 5   XELJANZ 5 MG TABS TAKE 1 TABLET(5 MG) BY MOUTH DAILY 90 tablet 0   No current facility-administered medications for this visit.    REVIEW OF SYSTEMS (negative unless checked):   Cardiac:  []  Chest pain or chest pressure? []  Shortness of breath upon activity? []  Shortness of breath when lying flat? []  Irregular heart rhythm?  Vascular:  []  Pain in calf, thigh, or hip brought on by walking? []  Pain in feet at night that wakes you up from your sleep? []  Blood clot in your veins? []  Leg swelling?  Pulmonary:  []  Oxygen at home? []  Productive cough? []  Wheezing?  Neurologic:  []  Sudden weakness in arms or legs? []  Sudden numbness in arms or legs? []  Sudden onset of difficult speaking or slurred speech? []  Temporary loss of vision in one eye? []  Problems with dizziness?  Gastrointestinal:  []  Blood in stool? []  Vomited blood?  Genitourinary:  []  Burning when urinating? []  Blood in urine?  Psychiatric:  []  Major depression  Hematologic:  []  Bleeding problems? []  Problems with blood clotting?  Dermatologic:  []  Rashes or ulcers?  Constitutional:  []  Fever or chills?  Ear/Nose/Throat:  []  Change in hearing? []  Nose bleeds? []  Sore throat?  Musculoskeletal:  []  Back pain? []  Joint pain? []  Muscle pain?   Physical Examination   Vitals:   03/07/23 1218  BP: 103/72  Pulse: 85  Resp: 18  Temp: 97.9 F (36.6 C)  TempSrc: Temporal  SpO2: 91%  Weight: 165 lb 14.4 oz (75.3 kg)  Height: 5\' 9"  (1.753 m)   Body mass index is 24.5 kg/m.  General:  WDWN in NAD; vital signs documented above Gait: Not observed HENT: WNL, normocephalic Pulmonary: normal non-labored breathing , without rales, rhonchi,   wheezing Cardiac: regular Abdomen: soft, NT, nondistended Skin: without rashes Vascular Exam/Pulses: Nonpalpable pedal pulses. Intact DP/PT doppler signals bilaterally Extremities: without ischemic changes, without gangrene , without cellulitis; without open wounds;  Musculoskeletal: no muscle wasting or atrophy  Neurologic: A&O X 3;  No focal weakness or paresthesias are detected Psychiatric:  The pt has Normal affect.  Non-Invasive Vascular imaging   ABI (03/07/2023) R:  ABI: [place] ([place]),  PT: tri DP: tri TBI:  0.31 L:  ABI: [place] ([place]),  PT: mono DP: mono TBI: 0.41   LLE Arterial Duplex (03/07/2023) LEFT       PSV cm/sRatioStenosisWaveform  Comments  +-----------+--------+-----+--------+----------+--------+  POP Prox   49  triphasic           +-----------+--------+-----+--------+----------+--------+  POP Distal 59                   triphasic           +-----------+--------+-----+--------+----------+--------+  ATA Distal 9                    monophasic          +-----------+--------+-----+--------+----------+--------+  PTA Distal 10                   monophasic          +-----------+--------+-----+--------+----------+--------+  PERO Distal66                   monophasic          +-----------+--------+-----+--------+----------+--------+     Left Graft #1: CFA to AK pop  +--------------------+--------+--------+---------+--------+                     PSV cm/sStenosisWaveform Comments  +--------------------+--------+--------+---------+--------+  Inflow             83              triphasic          +--------------------+--------+--------+---------+--------+  Proximal Anastomosis63              biphasic           +--------------------+--------+--------+---------+--------+  Proximal Graft      45              triphasic          +--------------------+--------+--------+---------+--------+  Mid Graft            42              triphasic          +--------------------+--------+--------+---------+--------+  Distal Graft        48              triphasic          +--------------------+--------+--------+---------+--------+  Distal Anastomosis  50              triphasic          +--------------------+--------+--------+---------+--------+  Outflow            40              triphasic           Medical Decision Making   [name] is a 73 y.o. male who presents for surveillance of PAD  Based on the patient's vascular studies, his ABIs remain noncompressible bilaterally.  He has triphasic tibial vessel flow on the right and monophasic tibial vessel flow in the left Arterial duplex demonstrates a patent left common femoral to above-knee popliteal artery bypass.  Velocities within the bypass are stable.  He denies any rest pain, tissue loss, or claudication.  On exam he has brisk DP/PT Doppler signals bilaterally I have encouraged him to continue to follow-up with his PCP regarding his abdominal pain.  I do not think that this is related to his blood flow or previous access site. He can follow up with our office in 6 months with ABIs, LLE BPG duplex, and right aortoiliac duplex   Loel Dubonnet PA-C [facility] Office: [phone]  Clinic MD: Hetty Blend

## 2023-03-17 ENCOUNTER — Telehealth: Payer: Self-pay | Admitting: Cardiology

## 2023-03-17 NOTE — Telephone Encounter (Signed)
Patient called to follow-up on test results. 

## 2023-03-17 NOTE — Telephone Encounter (Signed)
 Left message to call back

## 2023-03-18 ENCOUNTER — Ambulatory Visit: Payer: 59 | Admitting: Nurse Practitioner

## 2023-03-18 NOTE — Telephone Encounter (Signed)
 Left voice mail

## 2023-03-18 NOTE — Telephone Encounter (Signed)
 Sent to voicemail 2x in a row.

## 2023-03-20 ENCOUNTER — Ambulatory Visit: Payer: 59 | Attending: Nurse Practitioner

## 2023-03-20 ENCOUNTER — Other Ambulatory Visit: Payer: Self-pay | Admitting: *Deleted

## 2023-03-20 DIAGNOSIS — I739 Peripheral vascular disease, unspecified: Secondary | ICD-10-CM

## 2023-03-20 DIAGNOSIS — T82599A Other mechanical complication of unspecified cardiac and vascular devices and implants, initial encounter: Secondary | ICD-10-CM

## 2023-03-20 DIAGNOSIS — R0989 Other specified symptoms and signs involving the circulatory and respiratory systems: Secondary | ICD-10-CM

## 2023-03-20 DIAGNOSIS — Z95828 Presence of other vascular implants and grafts: Secondary | ICD-10-CM

## 2023-03-31 ENCOUNTER — Ambulatory Visit

## 2023-03-31 ENCOUNTER — Telehealth: Payer: Self-pay

## 2023-03-31 ENCOUNTER — Ambulatory Visit (INDEPENDENT_AMBULATORY_CARE_PROVIDER_SITE_OTHER)

## 2023-03-31 ENCOUNTER — Ambulatory Visit (INDEPENDENT_AMBULATORY_CARE_PROVIDER_SITE_OTHER): Admitting: Physician Assistant

## 2023-03-31 VITALS — BP 135/86 | HR 82 | Temp 97.3°F | Ht 69.0 in | Wt 166.8 lb

## 2023-03-31 DIAGNOSIS — I739 Peripheral vascular disease, unspecified: Secondary | ICD-10-CM

## 2023-03-31 DIAGNOSIS — T82599A Other mechanical complication of unspecified cardiac and vascular devices and implants, initial encounter: Secondary | ICD-10-CM

## 2023-03-31 DIAGNOSIS — Z95828 Presence of other vascular implants and grafts: Secondary | ICD-10-CM

## 2023-03-31 LAB — VAS US ABI WITH/WO TBI

## 2023-03-31 NOTE — Telephone Encounter (Signed)
 Spoke to pt's daughter Efraim Kaufmann who left message that pt was seen over the weekend in the ED at [facility] and was given a disc with his imaging and told to follow up with vascular for "stent blockage". She is unable to provide any other information and we are unable to see imaging/notes from [facility] in Care Everywhere. Pt has been scheduled for ABIs this afternoon and is scheduled to f/u with MD this week. They will bring in his disc to appt. Melissa is aware of these appts and will be coming with pt.

## 2023-03-31 NOTE — Telephone Encounter (Signed)
 Pt has been scheduled today for ABIs and then to see APP after. He is c/o LLE rest pain. He and his daughter, who will be bringing him are aware of appts. Today.

## 2023-03-31 NOTE — Progress Notes (Unsigned)
 Office Note     CC:  follow up Requesting Provider:  Ignatius Specking, MD  HPI: [name] is a 73 y.o. (04/24/1950) male who was added onto office schedule as an urgent triage visit.  He presented to the [facility] emergency department with left groin pain.  CTA was performed.  The emergency department referred the patient back to our office due to "occluded bypass."  [name] is well-known to [facility] with history of the following procedures:  1) left common femoral to above-knee popliteal artery bypass with PTFE in 2007 by Dr. Hart [name] 2) redo left common femoral to above-knee popliteal artery bypass with PTFE in 2012 by Dr. Hart [name] 3) right common iliac artery angioplasty and stenting in 2013 by Dr. Imogene [name] 4) right common iliac artery angioplasty in 2019 by Dr. Imogene [name] 5) right anterior tibial artery and SFA angioplasty in 2022 by Dr. Chestine [name]  He denies any claudication, rest pain, or tissue loss of bilateral lower extremities.  He was recently seen in the office on 03/07/2023 and at that time left lower extremity redo bypass was patent.  He has incompressible tibial vessels however had a toe pressure of about 50 mmHg.  He is on Plavix and statin daily.  He denies tobacco use.  Past Medical History:  Diagnosis Date   Anemia    Bronchitis    Carotid artery disease (HCC)    Cervical spine disease    Neck surgery in 2007 with [name]   Collagen vascular disease [facility])    COPD (chronic obstructive pulmonary disease) (HCC)    Coronary atherosclerosis of native coronary artery    Nonobstrtuctive by catheterization 2007, 2013, 2021   DVT (deep venous thrombosis) (HCC)    Essential hypertension    GERD (gastroesophageal reflux disease)    Hyperlipidemia    PAD (peripheral artery disease) (HCC)    s/p left fem-pop bypass 2007; PTA and stenting of right iliac artery 09/2011   Rheumatoid arthritis(714.0)    Type 2 diabetes mellitus (HCC)     Past Surgical History:  Procedure Laterality Date    ABDOMINAL AORTAGRAM N/A 10/03/2011   Procedure: ABDOMINAL Ronny Flurry;  Surgeon: Brett Hertz, MD;  Location: [facility] CATH LAB;  Service: Cardiovascular;  Laterality: N/A;   ABDOMINAL AORTOGRAM W/LOWER EXTREMITY N/A 01/16/2017   Procedure: ABDOMINAL AORTOGRAM W/LOWER EXTREMITY;  Surgeon: Brett Hertz, MD;  Location: [facility] INVASIVE CV LAB;  Service: Cardiovascular;  Laterality: N/A;   ABDOMINAL AORTOGRAM W/LOWER EXTREMITY Bilateral 08/04/2019   Procedure: ABDOMINAL AORTOGRAM W/LOWER EXTREMITY;  Surgeon: Brett Shelling, MD;  Location: [facility] INVASIVE CV LAB;  Service: Cardiovascular;  Laterality: Bilateral;   ABDOMINAL AORTOGRAM W/LOWER EXTREMITY N/A 03/09/2020   Procedure: ABDOMINAL AORTOGRAM W/LOWER EXTREMITY;  Surgeon: Brett Shelling, MD;  Location: [facility] INVASIVE CV LAB;  Service: Cardiovascular;  Laterality: N/A;   ABDOMINAL AORTOGRAM W/LOWER EXTREMITY N/A 12/02/2022   Procedure: ABDOMINAL AORTOGRAM W/LOWER EXTREMITY;  Surgeon: Brett Pastures, MD;  Location: [facility] INVASIVE CV LAB;  Service: Cardiovascular;  Laterality: N/A;   BIOPSY N/A 12/28/2021   Procedure: BIOPSY;  Surgeon: Brett Bal, DO;  Location: [facility] ENDO SUITE;  Service: Endoscopy;  Laterality: N/A;   CERVICAL DISC SURGERY     COLONOSCOPY  2013   Hyperplastic polyp   COLONOSCOPY WITH PROPOFOL N/A 02/17/2017   Surgeon: Brett Ade, MD; Many small and large mouth diverticula in the sigmoid, descending, transverse colon, internal hemorrhoids that were medium sized and grade 1.   COLONOSCOPY WITH PROPOFOL N/A  12/28/2021   Surgeon: Brett Bal, DO;  Diverticulosis in the sigmoid colon and descending colon.  Likely benign tumor in the ascending colon.   ESOPHAGOGASTRODUODENOSCOPY (EGD) WITH PROPOFOL N/A 12/28/2021   Procedure: ESOPHAGOGASTRODUODENOSCOPY (EGD) WITH PROPOFOL;  Surgeon: Brett Bal, DO;  Location: [facility] ENDO SUITE;  Service: Endoscopy;  Laterality: N/A;   FEMORAL-POPLITEAL BYPASS GRAFT  11/06/2005   ILIAC  ARTERY STENT  10/03/2011   Right CIA stenting   LEFT HEART CATH AND CORONARY ANGIOGRAPHY N/A 04/14/2019   Procedure: LEFT HEART CATH AND CORONARY ANGIOGRAPHY;  Surgeon: Brett Bihari, MD;  Location: [facility] INVASIVE CV LAB;  Service: Cardiovascular;  Laterality: N/A;   PERIPHERAL VASCULAR BALLOON ANGIOPLASTY Right 01/16/2017   Procedure: PERIPHERAL VASCULAR BALLOON ANGIOPLASTY;  Surgeon: Brett Hertz, MD;  Location: [facility] INVASIVE CV LAB;  Service: Cardiovascular;  Laterality: Right;  common iliac    PERIPHERAL VASCULAR BALLOON ANGIOPLASTY  03/09/2020   Procedure: PERIPHERAL VASCULAR BALLOON ANGIOPLASTY;  Surgeon: Brett Shelling, MD;  Location: [facility] INVASIVE CV LAB;  Service: Cardiovascular;;   PR VEIN BYPASS GRAFT,AORTO-FEM-POP  06/22/2010   Redo Left Fem-Pop   Repair of left arm fracture     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     SURGERY SCROTAL / TESTICULAR      Social History   Socioeconomic History   Marital status: Single    Spouse name: Not on file   Number of children: Not on file   Years of education: Not on file   Highest education level: Not on file  Occupational History   Not on file  Tobacco Use   Smoking status: Former    Current packs/day: 0.00    Average packs/day: 1.5 packs/day for 45.0 years (67.5 ttl pk-yrs)    Types: Cigarettes, Cigars    Start date: 09/01/1966    Quit date: 09/01/2011    Years since quitting: 11.5    Passive exposure: Never   Smokeless tobacco: Never  Vaping Use   Vaping status: Never Used  Substance and Sexual Activity   Alcohol use: Not Currently   Drug use: No   Sexual activity: Not Currently    Birth control/protection: None  Other Topics Concern   Not on file  Social History Narrative   The patient lives in [place].    Social Drivers of Corporate investment banker Strain: Not on file  Food Insecurity: Not on file  Transportation Needs: Not on file  Physical Activity: Not on file  Stress: Not on file  Social Connections: Not on file   Intimate Partner Violence: Not on file    Family History  Problem Relation Age of Onset   Heart attack Mother 2   Deep vein thrombosis Mother    Diabetes Mother    Hyperlipidemia Mother    Hypertension Mother    Peripheral vascular disease Mother    Stroke Father    Diabetes Father    Hyperlipidemia Father    Hypertension Father    Peripheral vascular disease Father    Diabetes Sister    Hyperlipidemia Sister    Hypertension Sister    Diabetes Brother    Hyperlipidemia Brother    Cancer Brother    Deep vein thrombosis Brother    Hypertension Brother    Peripheral vascular disease Brother    Hyperlipidemia Son    Cancer Son    Hypertension Son    Diabetes Daughter    Colon cancer Neg Hx     Current Outpatient Medications  Medication Sig Dispense Refill   albuterol (PROAIR HFA) 108 (90 Base) MCG/ACT inhaler Inhale 2 puffs into the lungs every 6 (six) hours as needed for wheezing or shortness of breath. 8 g 2   atorvastatin (LIPITOR) 80 MG tablet TAKE 1 TABLET(80 MG) BY MOUTH DAILY (Patient taking differently: Take 40 mg by mouth in the morning and at bedtime.) 90 tablet 3   cilostazol (PLETAL) 100 MG tablet TAKE 1 TABLET(100 MG) BY MOUTH TWICE DAILY 180 tablet 3   clopidogrel (PLAVIX) 75 MG tablet Take 1 tablet (75 mg total) by mouth daily. 30 tablet 11   diclofenac Sodium (VOLTAREN) 1 % GEL Apply 2 gram  to painful foot and ankle joint twice daily. (Patient taking differently: Apply 2 g topically 2 (two) times daily as needed (pain (foot/ankle)). Apply 2 gram  to painful foot and ankle joint twice daily.) 150 g 1   empagliflozin (JARDIANCE) 25 MG TABS tablet Take 25 mg by mouth in the morning.     glipiZIDE (GLUCOTROL) 5 MG tablet Take 5-10 mg by mouth See admin instructions.     HYDROcodone-acetaminophen (NORCO) 7.5-325 MG per tablet Take 1 tablet by mouth 2 (two) times daily as needed (pain.).     JANUVIA 100 MG tablet Take 100 mg by mouth in the morning.      ketoconazole (NIZORAL) 2 % cream Apply 1 Application topically daily as needed (skin irritation (feet/toes)).     LANTUS SOLOSTAR 100 UNIT/ML Solostar Pen Inject 10 Units into the skin at bedtime.     metoprolol succinate (TOPROL XL) 25 MG 24 hr tablet Take 0.5 tablets (12.5 mg total) by mouth daily.     metoprolol tartrate (LOPRESSOR) 100 MG tablet Take 1 (100 Mg) tablet 2 hours before your procedure 1 tablet 0   nitroGLYCERIN (NITROSTAT) 0.4 MG SL tablet Place 1 tablet (0.4 mg total) under the tongue every 5 (five) minutes x 3 doses as needed for chest pain (If no relief after 3rd dose, GO TO ER.). 25 tablet 3   Omega-3 Fatty Acids (OMEGA-3 FISH OIL PO) Take 1 capsule by mouth daily.     ONE TOUCH ULTRA TEST test strip      OneTouch Delica Lancets 33G MISC Apply topically.     Plecanatide (TRULANCE) 3 MG TABS Take 1 tablet (3 mg total) by mouth daily. (Patient taking differently: Take 3 mg by mouth daily as needed (constipation).) 30 tablet 5   sildenafil (REVATIO) 20 MG tablet Take 40-60 mg by mouth daily as needed for erectile dysfunction.     SPIRIVA RESPIMAT 2.5 MCG/ACT AERS INHALE 2 PUFFS INTO THE LUNGS DAILY 4 g 11   SYMBICORT 160-4.5 MCG/ACT inhaler INHALE 2 PUFFS FIRST THING IN THE MORNING AND THEN 2 PUFFS 12 HOURS LATER 10.2 g 5   XELJANZ 5 MG TABS TAKE 1 TABLET(5 MG) BY MOUTH DAILY 90 tablet 0   dexlansoprazole (DEXILANT) 60 MG capsule Take 1 capsule (60 mg total) by mouth daily. 30 capsule 11   No current facility-administered medications for this visit.    Allergies  Allergen Reactions   Penicillins Hives     REVIEW OF SYSTEMS:   [X]  denotes positive finding, [ ]  denotes negative finding Cardiac  Comments:  Chest pain or chest pressure:    Shortness of breath upon exertion:    Short of breath when lying flat:    Irregular heart rhythm:        Vascular    Pain in calf, thigh, or  hip brought on by ambulation:    Pain in feet at night that wakes you up from your sleep:      Blood clot in your veins:    Leg swelling:         Pulmonary    Oxygen at home:    Productive cough:     Wheezing:         Neurologic    Sudden weakness in arms or legs:     Sudden numbness in arms or legs:     Sudden onset of difficulty speaking or slurred speech:    Temporary loss of vision in one eye:     Problems with dizziness:         Gastrointestinal    Blood in stool:     Vomited blood:         Genitourinary    Burning when urinating:     Blood in urine:        Psychiatric    Major depression:         Hematologic    Bleeding problems:    Problems with blood clotting too easily:        Skin    Rashes or ulcers:        Constitutional    Fever or chills:      PHYSICAL EXAMINATION:  Vitals:   03/31/23 1452  BP: 135/86  Pulse: 82  Temp: (!) 97.3 F (36.3 C)  TempSrc: Temporal  SpO2: 91%  Weight: 166 lb 12.8 oz (75.7 kg)  Height: 5\' 9"  (1.753 m)    General:  WDWN in NAD; vital signs documented above Gait: Not observed HENT: WNL, normocephalic Pulmonary: normal non-labored breathing , without Rales, rhonchi,  wheezing Cardiac: regular HR Abdomen: soft, NT, no masses Skin: without rashes Vascular Exam/Pulses: 2+ palpable left femoral pulse; brisk left AT, PT, and peroneal by Doppler Extremities: without ischemic changes, without Gangrene , without cellulitis; without open wounds;  Musculoskeletal: no muscle wasting or atrophy  Neurologic: A&O X 3 Psychiatric:  The pt has Normal affect.   Non-Invasive Vascular Imaging:   CTA reviewed with Dr. Myra Gianotti from [facility] emergency department 03/30/2023  Known chronic occlusion of old bypass Redo bypass widely patent with area of 40 to 50% narrowing in the mid thigh  Toe pressure performance that [facility] on 03/31/2023 identical to toe pressure 1 month ago   ASSESSMENT/PLAN:: 73 y.o. male seen as an urgent add-on due to concern for occluded bypass of the left lower extremity  Mr. Fullenwider is a  73 year old male with history of numerous vascular procedures.  Of note he has a left leg bypass and also underwent redo bypass of the left lower extremity by Dr. Hart [name] years ago.  CT from [facility] ED reviewed with Dr. Myra Gianotti.  He has a known to be occluded bypass in the left lower extremity however the redo bypass remains patent.  There is a moderate narrowing in the mid thigh however we will repeat duplex in 3 months to reevaluate.  He is without claudication, rest pain, or tissue loss of the left lower extremity.  He has brisk left AT, PT, peroneal signals by Doppler.  He complains of left groin pain however this may be related to scar tissue.  He will try massage and heat to help with current discomfort.  He will follow-up in 3 months with repeat imaging.  He will continue his Plavix and statin daily.   Emilie Rutter, PA-C [facility] [phone]  Clinic MD:   Myra Gianotti

## 2023-04-01 ENCOUNTER — Encounter: Payer: Self-pay | Admitting: Physician Assistant

## 2023-04-01 ENCOUNTER — Ambulatory Visit: Admitting: Vascular Surgery

## 2023-04-01 ENCOUNTER — Ambulatory Visit: Payer: 59 | Attending: Nurse Practitioner | Admitting: Nurse Practitioner

## 2023-04-01 ENCOUNTER — Encounter: Payer: Self-pay | Admitting: Nurse Practitioner

## 2023-04-01 VITALS — BP 118/64 | HR 85 | Ht 69.0 in | Wt 170.8 lb

## 2023-04-01 DIAGNOSIS — I10 Essential (primary) hypertension: Secondary | ICD-10-CM

## 2023-04-01 DIAGNOSIS — I77810 Thoracic aortic ectasia: Secondary | ICD-10-CM

## 2023-04-01 DIAGNOSIS — I6523 Occlusion and stenosis of bilateral carotid arteries: Secondary | ICD-10-CM

## 2023-04-01 DIAGNOSIS — I5022 Chronic systolic (congestive) heart failure: Secondary | ICD-10-CM | POA: Diagnosis not present

## 2023-04-01 DIAGNOSIS — I739 Peripheral vascular disease, unspecified: Secondary | ICD-10-CM

## 2023-04-01 DIAGNOSIS — I2510 Atherosclerotic heart disease of native coronary artery without angina pectoris: Secondary | ICD-10-CM | POA: Diagnosis not present

## 2023-04-01 DIAGNOSIS — I5020 Unspecified systolic (congestive) heart failure: Secondary | ICD-10-CM

## 2023-04-01 DIAGNOSIS — R0789 Other chest pain: Secondary | ICD-10-CM

## 2023-04-01 DIAGNOSIS — E785 Hyperlipidemia, unspecified: Secondary | ICD-10-CM

## 2023-04-01 NOTE — Progress Notes (Unsigned)
 Cardiology Office Note:    Date: 04/01/2023 ID:  [name], DOB 1950/09/27, MRN [id number] PCP:  Brett Specking, MD [facility] Providers Cardiologist:  Brett Dell, MD    Referring MD: Brett Specking, MD   CC: Here for follow-up  History of Present Illness:    [name] is a very pleasant 73 y.o. male with a PMH of HFmrEF, palpitations/PSVT, HLD, PAD, carotid artery disease, COPD, T2DM, RA, HTN, GERD, and hx of DVT, who presents today for scheduled follow-up.    Previous cardiovascular history of nonobstructive CAD by cardiac catheterization as seen in 2007, 2013, and 2021.  He is closely followed by [facility] for history of PAD, status post left fem-pop bypass in 2007, with PTCA and stenting of right iliac artery in 2013.  Recent cardiac monitor demonstrated brief episodes of PSVT.  Was started on beta-blocker.  Echocardiogram revealed mildly reduced left ventricular systolic function at 45 to 50%, mild apical septal hypokinesis.  Last seen by Dr. Diona [name] on January 17, 2022.  Was doing well at that time.  Claudication was stable, symptoms noted more on right than left.  Lexiscan Myoview was arranged and revealed findings consistent with inferior wall infarct scar and moderate peri-infarct ischemia, EF measured at 53%.  Patient did not take beta-blocker as instructed from previous office visit, Dr. Diona [name] started Toprol-XL 12.5 mg daily.   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   I last saw him for follow-up on September 18, 2022. Was doing well.    01/16/2023 - He recently contacted our office noting intermittent chest pain x 2 weeks. Today he presents for chest pain evaluation, describes this as occurring along under his left shoulder that radiates to his chest. He reported nausea and dizziness associated with the pain. Stated pain worse at night with laying down on this side. He  describes the pain as moving middle of chest to under his left armpit for me today. This has been chronic and stable over time. Denies any worsening shortness of breath, palpitations, syncope, presyncope, dizziness, orthopnea, PND, swelling or significant weight changes, acute bleeding, or claudication.  CCTA 02/2023 revealed coronary calcium score of 734, with findings of minimal, nonobstructive CAD.   04/01/2023 - He presents today for follow-up. Not having CP as often as he used to, believes his symptoms are MSK related, sometimes experiences this when he exercises. Denies any recent/activechest pain, shortness of breath, palpitations, syncope, presyncope, dizziness, orthopnea, PND, swelling or significant weight changes, acute bleeding, or claudication.  SH: Very active and exercises every day.  ROS:   Please see the history of present illness.     All other systems reviewed and are negative.  EKGs/Labs/Other Studies Reviewed:    The following studies were reviewed today:  EKG: EKG is not ordered today.   Carotid duplex 03/2023: Summary:  Right Carotid: Velocities in the right ICA are consistent with a 1-39%  stenosis. Non-hemodynamically significant plaque <50% noted in the  CCA. The ECA appears <50% stenosed.   Left Carotid: Velocities in the left ICA are consistent with a 1-39%  stenosis. Non-hemodynamically significant plaque <50% noted in the  CCA. The ECA appears <50% stenosed.   Vertebrals:  Bilateral vertebral arteries demonstrate antegrade flow.  Subclavians: Normal flow hemodynamics were seen in bilateral subclavian arteries.   *See table(s) above for measurements and observations.  Suggest follow up study in 12 months.  ABI's 02/2023:  Summary:  Right: Resting right ankle-brachial index indicates noncompressible right  lower extremity arteries. The right toe-brachial index is abnormal.   Left: Resting left ankle-brachial index indicates noncompressible left  lower  extremity arteries. The left toe-brachial index is abnormal.  Vascular US lower extremity arterial duplex 02/2023:   Summary:  Left: Patent left fem-pop bypass graft with no stenosis. Graft velocities  at the 40 cm/s mark.  CCTA 02/2023:  IMPRESSION: 1. Coronary calcium score of 734. This was 90th percentile for age-, sex, and race-matched controls.   2. Total plaque volume 655 mm3. (Calcified plaque 137 mm3; non-calcified plaque 518 mm3). 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 Percentile for age- and sex-matched controls is not currently available.   3. Normal coronary origin with right dominance.   4. There are regions of heavily calcified plaque, but no obstructive coronary disease. There is mild (25-49%) obstruction in the LAD. CAD-RADS 2.   5. Aortic atherosclerosis.  Vascular ultrasound bypass graft duplex 05/2022: Left: Patent left common femoral-popliteal artery bypass graft.  Vascular ultrasound aorta/IVC/iliacs Doppler limited 05/2022: See full report under "CV Proc"  ABIs 05/2022: Summary:  Right: Resting right ankle-brachial index indicates noncompressible right  lower extremity arteries. The right toe-brachial index is abnormal.   Left: Resting left ankle-brachial index is within normal range. The left  toe-brachial index is abnormal.  Carotid Doppler 03/2022: Summary:  Right Carotid: Velocities in the right ICA are consistent with a 1-39%  stenosis.   Left Carotid: Velocities in the left ICA are consistent with a 1-39%  stenosis.   Vertebrals: Bilateral vertebral arteries demonstrate antegrade flow.  Subclavians: Normal flow hemodynamics were seen in bilateral subclavian               arteries.   Myoview on 01/24/2022:    Findings are consistent with prior inferior infarction with mild peri-infarct ischemia. The study is low risk.   No ST deviation was noted.   LV perfusion is abnormal. Moderate size moderate intensity inferior defect with mild reversibility.   Left ventricular  function is normal. Nuclear stress EF: 53 %. The left ventricular ejection fraction is normal (55-65%). End diastolic cavity size is normal.      Echocardiogram on 01/10/2022:  1. Left ventricular ejection fraction, by estimation, is 45 to 50%. The  left ventricle has mildly decreased function. The left ventricle  demonstrates regional wall motion abnormalities (see scoring  diagram/findings for description). There is mild  concentric left ventricular hypertrophy. Left ventricular diastolic  parameters are consistent with Grade I diastolic dysfunction (impaired  relaxation).   2. Right ventricular systolic function is normal. The right ventricular  size is normal.   3. Left atrial size was upper normal.   4. The mitral valve is grossly normal. Trivial mitral valve  regurgitation.   5. The aortic valve is tricuspid. There is mild calcification of the  aortic valve. Aortic valve regurgitation is mild. Aortic regurgitation PHT  measures 531 msec. Aortic valve mean gradient measures 8.0 mmHg.   6. Aortic dilatation noted. There is borderline dilatation of the aortic  root, measuring 39 mm.   7. The inferior vena cava is normal in size with greater than 50%  respiratory variability, suggesting right atrial pressure of 3 mmHg.   Comparison(s): Prior images unable to be directly viewed. LVEF reported at  50% at angiography in 2021.  Monitor on 01/01/2022:  Patient had a min HR of 59 bpm (sinus bradycardia), max HR of 203 bpm (1.9 second duration of brief supraventricular  tachycardia), and avg HR of 101 bpm (sinus tachycardia). Predominant underlying rhythm was Sinus Rhythm. 6 Supraventricular Tachycardia runs occurred, the run with the fastest interval lasting 5 beats (1.9 second duration) with a max rate of 203 bpm, the longest lasting 19 beats (6.8 seconds) with an avg rate of 163 bpm. Isolated SVEs were occasional (1.4%, 13864), SVE Couplets were rare (<1.0%, 88), and SVE Triplets were rare  (<1.0%, 5). Isolated VEs were rare (<1.0%, 3894), VE Couplets were rare (<1.0%, 22), and VE Triplets were rare (<1.0%, 1). Ventricular Bigeminy was present.    Impression: Brief supraventricular tachycardia detected (6 episodes in 7 days; longest duration 6.8 seconds).  Occasional PACs (1.4% burden).   Cardiac cath on 04/14/2019:  Prox LAD to Mid LAD lesion is 20% stenosed. Mid LAD lesion is 30% stenosed. Prox RCA lesion is 30% stenosed.   Mild non-obstructive CAD with 20 and 30% irregularities in the proximal to mid LAD; normal ramus intermediate and left circumflex coronary arteries;  and RCA with smooth 30% proximal narrowing with distal vessel tortuosity.   Low normal global LV function with EF estimated at 50%; LVEDP 14 mmHg.   RECOMMENDATION: Medical therapy for CAD.  Will increase lipid-lowering therapy to atorvastatin 40 mg.  Maintain optimal blood pressure control with target less than 130/80 and ideal blood pressure less than 120/80.  Target LDL less than 70.  Risk Assessment/Calculations:    The 10-year ASCVD risk score (Arnett DK, et al., 2019) is: 27.8%   Values used to calculate the score:     Age: 89 years     Sex: Male     Is Non-Hispanic African American: Yes     Diabetic: Yes     Tobacco smoker: No     Systolic Blood Pressure: 118 mmHg     Is BP treated: Yes     HDL Cholesterol: 53 mg/dL     Total Cholesterol: 142 mg/dL  Physical Exam:    VS:  BP 118/64   Pulse 85   Ht 5\' 9"  (1.753 m)   Wt 170 lb 12.8 oz (77.5 kg)   SpO2 92%   BMI 25.22 kg/m     Wt Readings from Last 3 Encounters:  04/01/23 170 lb 12.8 oz (77.5 kg)  03/31/23 166 lb 12.8 oz (75.7 kg)  03/07/23 165 lb 14.4 oz (75.3 kg)     GEN:  Well nourished, well developed in no acute distress HEENT: Normal NECK: No JVD; no carotid bruit, mild left carotid bruit CARDIAC: S1/S2, RRR, no murmurs, rubs, gallops; 2+ pulses RESPIRATORY:  Clear to auscultation without rales, wheezing or rhonchi   MUSCULOSKELETAL:  No edema; No deformity  SKIN: Warm and dry NEUROLOGIC:  Alert and oriented x 3 PSYCHIATRIC:  Normal affect   ASSESSMENT & PLAN:    In order of problems listed above:  Minimal CAD, atypical chest pain  Admits to chronic, stable symptoms that are atypical in etiology per his report. States this has been ongoing since becoming more active in gym this past year. See CCTA noted above. Continue atorvastatin, Plavix, Toprol XL, and NTG PRN. Previously educated him regarding NTG. Care and ED precautions discussed.     HFmrEF TTE 01/2022 showed EF mildly reduced at 45-50%, RWMA, mild LVH, grade 1 DD. Euvolemic and well compensated on exam. Continue GDMT -BP does not allow room to uptitrate GDMT at this time. Low sodium diet, fluid restriction <2L, and daily weights encouraged. Educated to contact our office for weight gain of  2 lbs overnight or 5 lbs in one week. Heart healthy diet and regular cardiovascular exercise encouraged.  Could not tolerate Entresto. Will update Echo at this time - see below.   PAD, HLD, carotid artery stenosis Last LDL 74 in January 2025. He is closely followed by [facility] for history of PAD, status post left fem-pop bypass in 2007, with PTCA and stenting of right iliac artery in 2013. See vascular studies noted above. Continue Pletal, Plavix, and Atorvastatin. Heart healthy diet and regular cardiovascular exercise encouraged.   HTN BP stable.  Continue current medication regimen. Discussed to monitor BP at home at least 2 hours after medications and sitting for 5-10 minutes. Heart healthy diet and regular cardiovascular exercise encouraged.   Aortic root dilatation Borderline dilatation of aortic root noted on previous Echo, measuring 39 mm. Will update this study for further evaluation. Care and ED precautions discussed.     Disposition: Follow-up with me or APP in 3-4 months or sooner if anything changes.   Medication Adjustments/Labs and Tests  Ordered: Current medicines are reviewed at length with the patient today.  Concerns regarding medicines are outlined above.  Orders Placed This Encounter  Procedures   ECHOCARDIOGRAM COMPLETE   No orders of the defined types were placed in this encounter.   Patient Instructions  Medication Instructions:  Your physician recommends that you continue on your current medications as directed. Please refer to the Current Medication list given to you today.   Labwork: None  Testing/Procedures: Your physician has requested that you have an echocardiogram. Echocardiography is a painless test that uses sound waves to create images of your heart. It provides your doctor with information about the size and shape of your heart and how well your heart's chambers and valves are working. This procedure takes approximately one hour. There are no restrictions for this procedure. Please do NOT wear cologne, perfume, aftershave, or lotions (deodorant is allowed). Please arrive 15 minutes prior to your appointment time.  Please note: We ask at that you not bring children with you during ultrasound (echo/ vascular) testing. Due to room size and safety concerns, children are not allowed in the ultrasound rooms during exams. Our front office staff cannot provide observation of children in our lobby area while testing is being conducted. An adult accompanying a patient to their appointment will only be allowed in the ultrasound room at the discretion of the ultrasound technician under special circumstances. We apologize for any inconvenience.   Follow-Up: Your physician recommends that you schedule a follow-up appointment in: 3-4 months  Any Other Special Instructions Will Be Listed Below (If Applicable). Thank you for choosing [facility]!     If you need a refill on your cardiac medications before your next appointment, please call your pharmacy.    Signed, Sharlene Dory, NP

## 2023-04-01 NOTE — Patient Instructions (Signed)
 Medication Instructions:  Your physician recommends that you continue on your current medications as directed. Please refer to the Current Medication list given to you today.   Labwork: None  Testing/Procedures: Your physician has requested that you have an echocardiogram. Echocardiography is a painless test that uses sound waves to create images of your heart. It provides your doctor with information about the size and shape of your heart and how well your heart's chambers and valves are working. This procedure takes approximately one hour. There are no restrictions for this procedure. Please do NOT wear cologne, perfume, aftershave, or lotions (deodorant is allowed). Please arrive 15 minutes prior to your appointment time.  Please note: We ask at that you not bring children with you during ultrasound (echo/ vascular) testing. Due to room size and safety concerns, children are not allowed in the ultrasound rooms during exams. Our front office staff cannot provide observation of children in our lobby area while testing is being conducted. An adult accompanying a patient to their appointment will only be allowed in the ultrasound room at the discretion of the ultrasound technician under special circumstances. We apologize for any inconvenience.   Follow-Up: Your physician recommends that you schedule a follow-up appointment in: 3-4 months  Any Other Special Instructions Will Be Listed Below (If Applicable). Thank you for choosing [facility]!     If you need a refill on your cardiac medications before your next appointment, please call your pharmacy.

## 2023-04-02 ENCOUNTER — Ambulatory Visit: Payer: 59 | Admitting: Podiatry

## 2023-04-02 ENCOUNTER — Ambulatory Visit: Payer: 59 | Admitting: Dermatology

## 2023-04-06 ENCOUNTER — Other Ambulatory Visit: Payer: Self-pay | Admitting: Vascular Surgery

## 2023-04-10 NOTE — Progress Notes (Unsigned)
 Office Visit Note  Patient: [name]             Date of Birth: 16-Sep-1950           MRN: [id number]             PCP: Orlena Bitters, MD Referring: Orlena Bitters, MD Visit Date: 04/24/2023 Occupation: @GUAROCC @  Subjective:  Medication monitoring  History of Present Illness: Aloysius Heinle is a 73 y.o. male with history of seronegative rheumatoid arthritis and osteoarthritis.  Patient has been off of Cloria Danger for the past 10 days.  He underwent right eye cataract removal on 04/18/2023 and is scheduled for the left eye on 05/02/2023.  Patient states that while off of Cloria Danger he experienced increased pain and inflammation involving the left shoulder, left wrist, and left knee.  A prednisone taper was sent to the pharmacy which she has been taking as prescribed.  His symptoms have improved while taking prednisone.  He denies any joint swelling at this time. He denies any recent or recurrent infections. He will require refill of Cloria Danger once he is cleared to resume therapy.    Activities of Daily Living:  Patient reports morning stiffness for 5 minutes.   Patient Reports nocturnal pain.  Difficulty dressing/grooming: Reports Difficulty climbing stairs: Reports Difficulty getting out of chair: Reports Difficulty using hands for taps, buttons, cutlery, and/or writing: Reports  Review of Systems  Constitutional:  Positive for fatigue.  HENT:  Positive for sore throat. Negative for mouth sores, mouth dryness and nose dryness.   Eyes:  Positive for photophobia, pain and dryness.  Respiratory:  Negative for difficulty breathing.   Cardiovascular:  Negative for chest pain and palpitations.  Gastrointestinal:  Positive for constipation. Negative for blood in stool and diarrhea.  Endocrine: Negative for increased urination.  Genitourinary:  Positive for urgency. Negative for involuntary urination.  Musculoskeletal:  Positive for gait problem, myalgias, morning stiffness and myalgias.  Negative for joint pain, joint pain, joint swelling, muscle weakness and muscle tenderness.  Skin:  Negative for color change, rash and sensitivity to sunlight.  Allergic/Immunologic: Negative for susceptible to infections.  Neurological:  Positive for headaches. Negative for dizziness.  Hematological:  Negative for swollen glands.  Psychiatric/Behavioral:  Positive for sleep disturbance. Negative for depressed mood. The patient is not nervous/anxious.     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   Opioid dependence (HCC) 10/15/2018   Other long term (current) drug therapy 10/15/2018   Diabetes mellitus type 2, controlled, without complications (HCC) 10/15/2018   Puncture wound of finger of left hand 08/08/2018   Puncture wound of finger, right, complicated, initial encounter 08/05/2018   DOE (dyspnea on exertion) 11/04/2017   Muscle cramps 07/04/2017   Contracture of left elbow 07/04/2017   High risk medication use 06/23/2017   Primary osteoarthritis of both hands 06/23/2017   Primary osteoarthritis of both feet 06/23/2017   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 factor (  HCC) 06/23/2017   DDD (degenerative disc disease), cervical 06/23/2017   Rotator cuff syndrome of right shoulder 06/06/2017   History of  bilateral carpal tunnel release 06/06/2017   Ganglion cyst of wrist, left 06/06/2017    Non-insulin dependent type 2 diabetes mellitus (HCC) 06/06/2017   Chest pain with high risk for cardiac etiology 04/05/2017   COPD with acute exacerbation (HCC) 03/13/2017   Plantar fasciitis 02/21/2017   Iliac artery stenosis, right (HCC) 02/12/2017   Black stool 02/10/2017   Abnormal CT of the abdomen 02/10/2017   Cervical spondylosis 10/11/2016   Bony growth 10/13/2015   Pain in joint, lower leg 03/02/2014   Discoloration of skin-Left dorsum foot 09/28/2013   Swelling of limb-Left Calf / Leg 09/28/2013   PVD (peripheral vascular disease) (HCC) 10/14/2012   Pain in limb- Left popliteal and calf 10/14/2012   Dyslipidemia, goal LDL below 70    Essential hypertension 03/05/2012   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 Rica Chalet   Collagen vascular disease [facility])    COPD (chronic obstructive pulmonary disease) (HCC)    Coronary atherosclerosis of native coronary artery    Nonobstrtuctive by catheterization 2007, 2013, 2021   DVT (deep venous thrombosis) (HCC)    Essential hypertension    GERD (gastroesophageal reflux disease)    Hyperlipidemia    PAD (peripheral artery disease) (HCC)    s/p left fem-pop bypass 2007; PTA and stenting of right iliac artery 09/2011   Rheumatoid arthritis(714.0)    Type 2 diabetes mellitus (HCC)     Family History  Problem Relation Age of Onset   Heart attack Mother 73    Deep vein thrombosis Mother    Diabetes Mother    Hyperlipidemia Mother    Hypertension Mother    Peripheral vascular disease Mother    Stroke Father    Diabetes Father    Hyperlipidemia Father    Hypertension Father    Peripheral vascular disease Father    Diabetes Sister    Hyperlipidemia Sister    Hypertension Sister    Diabetes Brother    Hyperlipidemia Brother    Cancer Brother    Deep vein thrombosis Brother    Hypertension Brother    Peripheral vascular disease Brother    Diabetes Daughter    Hyperlipidemia Son    Cancer Son    Hypertension Son    Colon cancer Neg Hx    Past Surgical History:  Procedure Laterality Date   ABDOMINAL AORTAGRAM N/A 10/03/2011   Procedure: ABDOMINAL Tommi Fraise;  Surgeon: Arvil Lauber, MD;  Location: [facility] CATH LAB;  Service: Cardiovascular;  Laterality: N/A;   ABDOMINAL AORTOGRAM W/LOWER EXTREMITY N/A 01/16/2017   Procedure: ABDOMINAL AORTOGRAM W/LOWER EXTREMITY;  Surgeon: Arvil Lauber, MD;  Location: [facility] INVASIVE CV LAB;  Service: Cardiovascular;  Laterality: N/A;   ABDOMINAL AORTOGRAM W/LOWER EXTREMITY Bilateral 08/04/2019   Procedure: ABDOMINAL AORTOGRAM W/LOWER EXTREMITY;  Surgeon:  Young Hensen, MD;  Location: [facility] INVASIVE CV LAB;  Service: Cardiovascular;  Laterality: Bilateral;   ABDOMINAL AORTOGRAM W/LOWER EXTREMITY N/A 03/09/2020   Procedure: ABDOMINAL AORTOGRAM W/LOWER EXTREMITY;  Surgeon: Young Hensen, MD;  Location: [facility] INVASIVE CV LAB;  Service: Cardiovascular;  Laterality: N/A;   ABDOMINAL AORTOGRAM W/LOWER EXTREMITY N/A 12/02/2022   Procedure: ABDOMINAL AORTOGRAM W/LOWER EXTREMITY;  Surgeon: Philipp Brawn, MD;  Location: [facility] INVASIVE CV LAB;  Service: Cardiovascular;  Laterality: N/A;   BIOPSY N/A 12/28/2021   Procedure: BIOPSY;  Surgeon: Vinetta Greening, DO;  Location: [facility] ENDO SUITE;  Service: Endoscopy;  Laterality: N/A;   CATARACT EXTRACTION W/PHACO Right 04/18/2023   Procedure: PHACOEMULSIFICATION, CATARACT,  WITH IOL INSERTION;  Surgeon: Ardeth Krabbe, MD;  Location: [facility] ORS;  Service: Ophthalmology;  Laterality: Right;  CDE: 1.81   CERVICAL DISC SURGERY     COLONOSCOPY  2013   Hyperplastic polyp   COLONOSCOPY WITH PROPOFOL N/A 02/17/2017   Surgeon: Suzette Espy, MD; Many small and large mouth diverticula in the sigmoid, descending, transverse colon, internal hemorrhoids that were medium sized and grade 1.   COLONOSCOPY WITH PROPOFOL N/A 12/28/2021   Surgeon: Vinetta Greening, DO;  Diverticulosis in the sigmoid colon and descending colon.  Likely benign tumor in the ascending colon.   ESOPHAGOGASTRODUODENOSCOPY (EGD) WITH PROPOFOL N/A 12/28/2021   Procedure: ESOPHAGOGASTRODUODENOSCOPY (EGD) WITH PROPOFOL;  Surgeon: Vinetta Greening, DO;  Location: [facility] ENDO SUITE;  Service: Endoscopy;  Laterality: N/A;   FEMORAL-POPLITEAL BYPASS GRAFT  11/06/2005   ILIAC ARTERY STENT  10/03/2011   Right CIA stenting   INSERTION, STENT, DRUG-ELUTING, LACRIMAL CANALICULUS Right 04/18/2023   Procedure: INSERTION, STENT, DRUG-ELUTING, LACRIMAL CANALICULUS;  Surgeon: Ardeth Krabbe, MD;  Location: [facility] ORS;  Service: Ophthalmology;  Laterality: Right;   LEFT HEART CATH AND CORONARY ANGIOGRAPHY N/A 04/14/2019   Procedure: LEFT HEART CATH AND CORONARY ANGIOGRAPHY;  Surgeon: Millicent Ally, MD;  Location: [facility] INVASIVE CV LAB;  Service: Cardiovascular;  Laterality: N/A;   PERIPHERAL VASCULAR BALLOON ANGIOPLASTY Right 01/16/2017   Procedure: PERIPHERAL VASCULAR BALLOON ANGIOPLASTY;  Surgeon: Arvil Lauber, MD;  Location: [facility] INVASIVE CV LAB;  Service: Cardiovascular;  Laterality: Right;  common iliac    PERIPHERAL VASCULAR BALLOON ANGIOPLASTY  03/09/2020   Procedure: PERIPHERAL VASCULAR BALLOON ANGIOPLASTY;  Surgeon: Young Hensen, MD;  Location: [facility] INVASIVE CV LAB;  Service: Cardiovascular;;   PR VEIN BYPASS GRAFT,AORTO-FEM-POP  06/22/2010   Redo Left Fem-Pop   Repair of left arm fracture     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    Immunization History  Administered Date(s) Administered   Influenza, High Dose Seasonal PF 10/14/2016, 09/21/2018   Influenza-Unspecified 11/08/2008, 12/10/2019   Moderna Sars-Covid-2 Vaccination 02/19/2019, 03/22/2019, 11/23/2019   PPD Test 09/22/2007   Td (Adult),5 Lf Tetanus Toxid, Preservative Free 02/09/2001   Tdap 02/09/2001, 08/05/2018     Objective: Vital Signs: BP 130/78 (BP Location: Left Arm, Patient Position: Sitting, Cuff Size: Normal)   Pulse 84   Resp 15   Ht 5\' 9"  (1.753 m)   Wt 169 lb 6.4 oz (76.8 kg)   BMI 25.02 kg/m    Physical Exam Vitals and nursing note reviewed.  Constitutional:      Appearance: He is well-developed.  HENT:     Head: Normocephalic and atraumatic.  Eyes:  Conjunctiva/sclera: Conjunctivae normal.     Pupils: Pupils are equal, round, and reactive to light.  Cardiovascular:     Rate and Rhythm: Normal rate and regular rhythm.     Heart sounds: Normal heart sounds.  Pulmonary:     Effort: Pulmonary effort is normal.     Breath sounds: Normal breath sounds.  Abdominal:     General: Bowel sounds are normal.     Palpations: Abdomen is soft.  Musculoskeletal:     Cervical back: Normal range of motion and neck supple.  Skin:    General: Skin is warm and dry.     Capillary Refill: Capillary refill takes less than 2 seconds.  Neurological:     Mental Status: He is alert and oriented to person, place, and time.  Psychiatric:        Behavior: Behavior normal.      Musculoskeletal Exam: C-spine limited ROM with lateral rotation.  Discomfort with ROM of the left shoulder.  Flexion contracture of both elbows noted.  Limited ROM of both wrists.  No tenderness or synovitis over MCP joints.  PIP and DIP thickening noted.  Limited extension and PIP joints.  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  Hip  joints have slightly limited range of motion.  Knee joints have good range of motion with no warmth or effusion.  Ankle joints have good range of motion with no tenderness or joint swelling.  CDAI Exam: CDAI Score: -- Patient Global: --; Provider Global: -- Swollen: --; Tender: -- Joint Exam 04/24/2023   No joint exam has been documented for this visit   There is currently no information documented on the homunculus. Go to the Rheumatology activity and complete the homunculus joint exam.  Investigation: No additional findings.  Imaging: VAS US  ABI WITH/WO TBI Result Date: 03/31/2023  LOWER EXTREMITY DOPPLER STUDY Patient Name:  [name]  Date of Exam:   03/31/2023 Medical Rec #: [id number]      Accession #:    [id number] Date of Birth: 04/29/1950     Patient Gender: M Patient Age:   73 years Exam Location:  [facility] Procedure:      VAS US  ABI WITH/WO TBI Referring Phys: Deneise Finlay --------------------------------------------------------------------------------  Indications: Peripheral artery disease. patient reports left leg pain for the              past few months. However, on Friday3/21/25 severe increase in pain              in left leg with walking. Patient also reports red, tender and              swelling in left groin area. High Risk         Hypertension, hyperlipidemia, Diabetes, past history of Factors:          smoking.  Vascular Interventions: 03/09/20 Right mid SFA angioplasty, right prox AATA                         angioplasty.                         08/04/2019- B/L LE angiogram                         2013- right common iliac artery angioplasty and stent  2007 left common femoral to above knee popliteal bypass. Comparison Study: Prior ABI 03/07/2023- bilateral [place] right TBI 0.31 left TBI                   0.41 Performing Technologist: Gelene Kelly Rdcs,Rvt  Examination Guidelines: A complete evaluation includes at minimum, Doppler waveform signals and  systolic blood pressure reading at the level of bilateral brachial, anterior tibial, and posterior tibial arteries, when vessel segments are accessible. Bilateral testing is considered an integral part of a complete examination. Photoelectric Plethysmograph (PPG) waveforms and toe systolic pressure readings are included as required and additional duplex testing as needed. Limited examinations for reoccurring indications may be performed as noted.  ABI Findings: +---------+------------------+-----+--------+--------+ Right    Rt Pressure (mmHg)IndexWaveformComment  +---------+------------------+-----+--------+--------+ Brachial 143                                     +---------+------------------+-----+--------+--------+ PTA      254               1.78 biphasic         +---------+------------------+-----+--------+--------+ DP       254               1.78 biphasic         +---------+------------------+-----+--------+--------+ Great Toe46                0.32 Abnormal         +---------+------------------+-----+--------+--------+ +---------+------------------+-----+----------+-------+ Left     Lt Pressure (mmHg)IndexWaveform  Comment +---------+------------------+-----+----------+-------+ Brachial 137                                      +---------+------------------+-----+----------+-------+ PTA      254               1.78 monophasic        +---------+------------------+-----+----------+-------+ DP       254               1.78 biphasic          +---------+------------------+-----+----------+-------+ Great Toe49                0.34 Abnormal          +---------+------------------+-----+----------+-------+ +-------+-----------+-----------+------------+------------+ ABI/TBIToday's ABIToday's TBIPrevious ABIPrevious TBI +-------+-----------+-----------+------------+------------+ Right  [place]         0.32       [place]          0.31          +-------+-----------+-----------+------------+------------+ Left   [place]         0.34       [place]          0.41         +-------+-----------+-----------+------------+------------+  Bilateral ABIs and TBIs appear essentially unchanged compared to prior study on 03/07/2023. Left ABI waveforms appear increased in comparison to 03/07/2023  Summary: Right: Resting right ankle-brachial index indicates noncompressible right lower extremity arteries. The right toe-brachial index is abnormal. Left: Resting left ankle-brachial index indicates noncompressible left lower extremity arteries. The left toe-brachial index is abnormal. *See table(s) above for measurements and observations.  Electronically signed by Genny Kid MD on 03/31/2023 at 4:14:39 PM.    Final     Recent Labs: Lab Results  Component Value Date   WBC 7.1 01/24/2023   HGB 12.5 (L)  01/24/2023   PLT 338 01/24/2023   NA 141 12/02/2022   K 4.0 12/02/2022   CL 106 12/02/2022   CO2 27 10/16/2022   GLUCOSE 145 (H) 12/02/2022   BUN 13 12/02/2022   CREATININE 0.90 12/02/2022   BILITOT 0.4 01/24/2023   ALKPHOS 79 01/25/2021   AST 15 01/24/2023   ALT 17 01/24/2023   PROT 7.2 01/24/2023   ALBUMIN 4.5 01/25/2021   CALCIUM 9.2 10/16/2022   GFRAA 83 01/27/2020   QFTBGOLDPLUS NEGATIVE 01/24/2023    Speciality Comments: Prior therapy includes: methotrexate ( d/c alcohol use), Arava (d/c patient preference), O[place] (patient declined)  Procedures:  No procedures performed Allergies: Penicillins   Assessment / Plan:     Visit Diagnoses: 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 factor (HCC): He has no synovitis on examination today.  Patient has been out of his prescription for Cloria Danger for the past 10 days.  While out of the prescription for Cloria Danger he was having increased discomfort in his left shoulder, left wrist, and left knee joint.  He underwent right eye cataract removal on 04/18/2023 and is scheduled for the left eye  on 05/02/2023.  Patient plans on continuing to hold Xeljanz until cleared by ophthalmology.  A prednisone taper starting at 20 mg tapering by 5 mg every 4 days were sent to the pharmacy on 04/18/2023.  He has noticed a significant improvement in his left shoulder, left wrist, and left knee pain since initiating prednisone.  He will notify us  once he has been cleared by ophthalmology at which time a new prescription for Cloria Danger will be sent to the pharmacy.  He will follow-up in the office in 3 months or sooner if needed.  High risk medication use - Xeljanz 5 mg 1 tablet by mouth daily. Previous therapy: Methotrexate d/c- alcohol use, Orencia declined by patient, treated with Cloria Danger [facility] at [facility]. CBC and CMP updated on 03/30/23.  His next lab work will be due in June and every 3 months to monitor for drug toxicity. Lipid panel within normal limits on 01/24/2023. TB gold negative on 01/24/23. No recent or recurrent infections.  Discussed the importance of holding xeljanz if he develops signs or symptoms of an infection and to resume once the infection has completely cleared.   Association of heart disease with rheumatoid arthritis was discussed. Need to monitor blood pressure, cholesterol, and to exercise 30-60 minutes on daily basis was discussed.  Cloria Danger: this class of medications has a FDA BLACK BOX WARNING for major adverse cardiovascular events (MACE), thrombosis, mortality (including sudden cardiovascular death), serious infections, and lymphomas. MACE is defined as cardiovascular death, myocardial infarction, and stroke. Thrombosis includes deep venous thrombosis (DVT), pulmonary embolism (PE), and arterial thrombosis. If you are a current or former smoker, you are at higher risk for MACE  Rotator cuff syndrome of right shoulder: Slightly limited internal rotation of the right shoulder noted.  No tenderness upon palpation.  Contracture of joint of both elbows: Unchanged.  No tenderness along the  joint line.  Primary osteoarthritis of both hands: He has PIP and DIP thickening consistent with osteoarthritis of both hands.  Limited extension of both wrist joints, left more severe than right.  He has been wearing a compression wrap on his left wrist for support.  No active inflammation was noted on examination today.  He has noticed an improvement in the discomfort and inflammation in his left wrist while taking prednisone.  History of  bilateral carpal tunnel release: Not  currently symptomatic.  Chronic pain of left knee: Patient has intermittent discomfort in the left knee.  No warmth or effusion was noted on examination today.  Primary osteoarthritis of both feet: Under the care of podiatry.  DDD (degenerative disc disease), cervical: C-spine has limited range of motion with lateral rotation.  Other medical conditions are listed as follows:  Palpitations  Mixed hyperlipidemia  Essential hypertension: Blood pressure was 130/78 today in the office.  COPD  GOLD II  Atherosclerosis of native coronary artery of native heart without angina pectoris  History of diabetes mellitus  PVD (peripheral vascular disease) (HCC)  Orders: No orders of the defined types were placed in this encounter.  No orders of the defined types were placed in this encounter.   Follow-Up Instructions: Return in about 3 months (around 07/24/2023) for Rheumatoid arthritis, Osteoarthritis.   Romayne Clubs, PA-C  Note - This record has been created using Dragon software.  Chart creation errors have been sought, but may not always  have been located. Such creation errors do not reflect on  the standard of medical care.

## 2023-04-11 ENCOUNTER — Encounter (HOSPITAL_COMMUNITY)
Admission: RE | Admit: 2023-04-11 | Discharge: 2023-04-11 | Disposition: A | Discharge status: Home / Self Care | Source: Ambulatory Visit | Attending: Optometry | Admitting: Optometry

## 2023-04-13 NOTE — Progress Notes (Unsigned)
 Subjective:    Patient ID: [name], male   DOB: June 26, 1950    MRN: [id number]   Brief patient profile:  72  yobm quit smoking 2013 with dx of copd GOLD II with reversibility on prn inhalers didn't really use it much and "worse over the years" and placed on BREO but worse fall  2018 so placed on Trelegy and referred to pulmonary clinic 01/28/2017 by Dr   Sherril [name]    History of Present Illness  01/28/2017 1st [name] visit/ [name]  ? Copd   Chief Complaint  Patient presents with   Pulmonary Consult    Referred by Dr. Gates [name] for eval of COPD. Pt states that he has been having SOB for the past several yrs, worse for the past 3-6 months. He gets SOB with walking short distances and up and down stairs. He has CP that comes and goes. He also has some cough with white sputum.  He is using proair once daily on average.   doe x years much worse x sev months, cp is not related to breathing or exertion localized L ant chest/ lasts just a few secs Cough is worse since sob worse/ esp hs and early in am Baseline = 15 min walk outside mostly flat but struggles if gets in a hurry or steps  Thinks proair helps his cp but note resolves in 2 secs whether uses proair or not rec Continue the dexilant but for now take it Take 30- 60 min before your first and last meals of the day until cough and breathing better then just Take 30-60 min before first meal of the day  Stop Trelegy and lisinopril Losartan 50 mg only daily takes the place of lisinopril  Plan A = Automatic = symbicort 160 Take 2 puffs first thing in am and then another 2 puffs about 12 hours later.  Work on inhaler technique:   Plan B = Backup Only use your albuterol as a rescue medication  05/20/2018 acute extended  ov/[name] re: copd/ recurrent cp x one month/ did not bring meds as requested   Chief Complaint  Patient presents with   Acute Visit    Pt c/o left side CP and increased SOB x 1 month. He has had muscle aches for the  past 6 wks.   Dyspnea:  Walks around park x 15 min daily never tries inhaler before walks, sob stops him and legs hurt but no cp with exertion  Cough: no Sleeping: on side bed is flat two pillows SABA use: bid avg  02: none  Cp comes and goes x years 2-3 min, always in same place / never supine/ did not follow IBS recs rec Plan A = Automatic = symbicort 160 Take 2 puffs first thing in am and then another 2 puffs about 12 hours later.  Add spiriva 2 pffs each am only  Plan B = Backup Only use your albuterol as a rescue medication  Classic   pain pattern suggests ibs rx citrucel/diet          08/09/2020  f/u ov/[name] re: GOLD 2/3  copd maint on  symbicort /spiriva  Chief Complaint  Patient presents with   Follow-up    Pt c/o unintentional wt loss and pain on his left side "for a good while".  His breathing is unchanged.    Dyspnea:  working out at gym x 15 min x ? Speed  and ex bike =resistance at 3 and ? Speed x  5-8 min and denies sob limiting  or any ex cp Cough: sporadic  Sleeping: bed is flat / months  SABA use: not using  02: none  Covid status:   vax x 3  Rec Rx as IBS Please schedule a follow up [name] visit in 4 weeks, sooner if needed  with all medications /inhalers/ solutions in hand so we can verify exactly what you are taking. This includes all medications from all doctors and over the counters       04/15/2022  f/u ov/[name]/[name] re: GOLD 2  maint on symb/spiriva  Chief Complaint  Patient presents with   Follow-up    Pt f/u for COPD, states that his breathing is baseline    Dyspnea:  vertigo slowing him down / still doing 15 min treadmill at 2 mph and no dyspnea or cp  Cough: none  Sleeping: flat bed/ 1 pillow SABA use: none  02: none  Lung cancer screening: referred 04/15/2022 Rec To get the most out of exercise, you need to be continuously aware that you are short of breath, but never out of breath, for at least 15 minutes daily   04/15/2023  f/u  ov/[place] [name]/[name] re: GOLD 2  maint on symbicort  Cc doe  Dyspnea:  rides bike at [employer] x 15 min  x 2-4 times per week  Cough: none  Sleeping: flat bed one pillow s  resp cc  SABA use: rarely  02: none  Classic LUQ IBS assoc with constipation- did not follow prior instructions  LCS  due q June     No obvious day to day or daytime variability or assoc excess/ purulent sputum or mucus plugs or hemoptysis   or chest tightness, subjective wheeze or overt sinus or hb symptoms.    Also denies any obvious fluctuation of symptoms with weather or environmental changes or other aggravating or alleviating factors except as outlined above   No unusual exposure hx or h/o childhood pna/ asthma or knowledge of premature birth.  Current Allergies, Complete Past Medical History, Past Surgical History, Family History, and Social History were reviewed in [employer] record.  ROS  The following are not active complaints unless bolded Hoarseness, sore throat, dysphagia, dental problems, itching, sneezing,  nasal congestion or discharge of excess mucus or purulent secretions, ear ache,   fever, chills, sweats, unintended wt loss or wt gain, classically pleuritic or exertional cp,  orthopnea pnd or arm/hand swelling  or leg swelling, presyncope, palpitations, abdominal pain, anorexia, nausea, vomiting, diarrhea  or change in bowel habits or change in bladder habits, change in stools or change in urine, dysuria, hematuria,  rash, arthralgias, visual complaints, headache, numbness, weakness or ataxia or problems with walking or coordination,  change in mood or  memory.           Outpatient Encounter Medications as of 04/15/2023  Medication Sig   albuterol (PROAIR HFA) 108 (90 Base) MCG/ACT inhaler Inhale 2 puffs into the lungs every 6 (six) hours as needed for wheezing or shortness of breath.   atorvastatin (LIPITOR) 80 MG tablet TAKE 1 TABLET(80 MG) BY MOUTH DAILY (Patient  taking differently: Take 80 mg by mouth daily.)   cilostazol (PLETAL) 100 MG tablet TAKE 1 TABLET(100 MG) BY MOUTH TWICE DAILY   clopidogrel (PLAVIX) 75 MG tablet Take 1 tablet (75 mg total) by mouth daily.   dexlansoprazole (DEXILANT) 60 MG capsule Take 1 capsule (60 mg total) by mouth daily.   diclofenac  Sodium (VOLTAREN) 1 % GEL Apply 2 gram  to painful foot and ankle joint twice daily. (Patient taking differently: Apply 2 g topically 2 (two) times daily as needed (pain (foot/ankle)). Apply 2 gram  to painful foot and ankle joint twice daily.)   empagliflozin (JARDIANCE) 25 MG TABS tablet Take 25 mg by mouth in the morning.   glipiZIDE (GLUCOTROL) 5 MG tablet Take 5-10 mg by mouth See admin instructions.   HYDROcodone-acetaminophen (NORCO) 7.5-325 MG per tablet Take 1 tablet by mouth 2 (two) times daily as needed (pain.).   JANUVIA 100 MG tablet Take 100 mg by mouth in the morning.   ketoconazole (NIZORAL) 2 % cream Apply 1 Application topically daily as needed (skin irritation (feet/toes)).   LANTUS SOLOSTAR 100 UNIT/ML Solostar Pen Inject 10 Units into the skin at bedtime.   metoprolol succinate (TOPROL XL) 25 MG 24 hr tablet Take 0.5 tablets (12.5 mg total) by mouth daily. (Patient taking differently: Take 25 mg by mouth daily.)   nitroGLYCERIN (NITROSTAT) 0.4 MG SL tablet Place 1 tablet (0.4 mg total) under the tongue every 5 (five) minutes x 3 doses as needed for chest pain (If no relief after 3rd dose, GO TO ER.).   Omega-3 Fatty Acids (OMEGA-3 FISH OIL PO) Take 1 capsule by mouth daily.   ONE TOUCH ULTRA TEST test strip    OneTouch Delica Lancets 33G MISC Apply topically.   Plecanatide (TRULANCE) 3 MG TABS Take 1 tablet (3 mg total) by mouth daily. (Patient taking differently: Take 3 mg by mouth daily as needed (constipation).)   sildenafil (REVATIO) 20 MG tablet Take 40-60 mg by mouth daily as needed for erectile dysfunction.   SPIRIVA RESPIMAT 2.5 MCG/ACT AERS INHALE 2 PUFFS INTO THE  LUNGS DAILY   SYMBICORT 160-4.5 MCG/ACT inhaler INHALE 2 PUFFS FIRST THING IN THE MORNING AND THEN 2 PUFFS 12 HOURS LATER   XELJANZ 5 MG TABS TAKE 1 TABLET(5 MG) BY MOUTH DAILY   metoprolol tartrate (LOPRESSOR) 100 MG tablet Take 1 (100 Mg) tablet 2 hours before your procedure (Patient not taking: Reported on 04/15/2023)   No facility-administered encounter medications on file as of 04/15/2023.        Objective:   Physical Exam   Wts  04/15/2023          169  04/15/2022          169  10/04/2021        167   09/28/2020      166   08/09/2020         158 02/09/2019         176  08/05/2018       172 05/20/2018       176 11/04/2017     180  04/04/2017       179  03/11/2017         180   02/13/2017        175   01/28/17 175 lb 12.8 oz (79.7 kg)  01/16/17 175 lb (79.4 kg)  11/01/16 170 lb (77.1 kg)    Vital signs reviewed  04/15/2023  - Note at rest 02 sats  90% on RA   General appearance:    amb bm nad/ easily confused with details of care    HEENT : Oropharynx  clear   Nasal turbinates nl    NECK :  without  apparent JVD/ palpable Nodes/TM    LUNGS: no acc muscle use,  Min barrel  contour  chest wall with bilateral  slightly decreased bs s audible wheeze and  without cough on insp or exp maneuvers and min  Hyperresonant  to  percussion bilaterally    CV:  RRR  no s3 or murmur or increase in P2, and no edema   ABD:  soft and nontender with pos end  insp Hoover's  in the supine position.  No bruits or organomegaly appreciated   MS:  Nl gait/ ext warm without deformities Or obvious joint restrictions  calf tenderness, cyanosis or clubbing     SKIN: warm and dry without lesions    NEURO:  alert, approp, nl sensorium with  no motor or cerebellar deficits apparent.            Assessment:    Outpatient Encounter Medications as of 04/15/2023  Medication Sig   albuterol (PROAIR HFA) 108 (90 Base) MCG/ACT inhaler Inhale 2 puffs into the lungs every 6 (six) hours as needed for wheezing or shortness  of breath.   atorvastatin (LIPITOR) 80 MG tablet TAKE 1 TABLET(80 MG) BY MOUTH DAILY (Patient taking differently: Take 80 mg by mouth daily.)   cilostazol (PLETAL) 100 MG tablet TAKE 1 TABLET(100 MG) BY MOUTH TWICE DAILY   clopidogrel (PLAVIX) 75 MG tablet Take 1 tablet (75 mg total) by mouth daily.   dexlansoprazole (DEXILANT) 60 MG capsule Take 1 capsule (60 mg total) by mouth daily.   diclofenac Sodium (VOLTAREN) 1 % GEL Apply 2 gram  to painful foot and ankle joint twice daily. (Patient taking differently: Apply 2 g topically 2 (two) times daily as needed (pain (foot/ankle)). Apply 2 gram  to painful foot and ankle joint twice daily.)   empagliflozin (JARDIANCE) 25 MG TABS tablet Take 25 mg by mouth in the morning.   glipiZIDE (GLUCOTROL) 5 MG tablet Take 5-10 mg by mouth See admin instructions.   HYDROcodone-acetaminophen (NORCO) 7.5-325 MG per tablet Take 1 tablet by mouth 2 (two) times daily as needed (pain.).   JANUVIA 100 MG tablet Take 100 mg by mouth in the morning.   ketoconazole (NIZORAL) 2 % cream Apply 1 Application topically daily as needed (skin irritation (feet/toes)).   LANTUS SOLOSTAR 100 UNIT/ML Solostar Pen Inject 10 Units into the skin at bedtime.   metoprolol succinate (TOPROL XL) 25 MG 24 hr tablet Take 0.5 tablets (12.5 mg total) by mouth daily. (Patient taking differently: Take 25 mg by mouth daily.)   nitroGLYCERIN (NITROSTAT) 0.4 MG SL tablet Place 1 tablet (0.4 mg total) under the tongue every 5 (five) minutes x 3 doses as needed for chest pain (If no relief after 3rd dose, GO TO ER.).   Omega-3 Fatty Acids (OMEGA-3 FISH OIL PO) Take 1 capsule by mouth daily.   ONE TOUCH ULTRA TEST test strip    OneTouch Delica Lancets 33G MISC Apply topically.   Plecanatide (TRULANCE) 3 MG TABS Take 1 tablet (3 mg total) by mouth daily. (Patient taking differently: Take 3 mg by mouth daily as needed (constipation).)   sildenafil (REVATIO) 20 MG tablet Take 40-60 mg by mouth daily as  needed for erectile dysfunction.   SPIRIVA RESPIMAT 2.5 MCG/ACT AERS INHALE 2 PUFFS INTO THE LUNGS DAILY   SYMBICORT 160-4.5 MCG/ACT inhaler INHALE 2 PUFFS FIRST THING IN THE MORNING AND THEN 2 PUFFS 12 HOURS LATER   XELJANZ 5 MG TABS TAKE 1 TABLET(5 MG) BY MOUTH DAILY   metoprolol tartrate (LOPRESSOR) 100 MG tablet Take 1 (100 Mg) tablet 2 hours before your procedure (Patient  not taking: Reported on 04/15/2023)   No facility-administered encounter medications on file as of 04/15/2023.

## 2023-04-15 ENCOUNTER — Ambulatory Visit (INDEPENDENT_AMBULATORY_CARE_PROVIDER_SITE_OTHER): Payer: 59 | Admitting: Internal Medicine

## 2023-04-15 ENCOUNTER — Encounter: Payer: Self-pay | Admitting: Internal Medicine

## 2023-04-15 VITALS — BP 112/72 | HR 95 | Ht 69.0 in | Wt 169.1 lb

## 2023-04-15 DIAGNOSIS — J449 Chronic obstructive pulmonary disease, unspecified: Secondary | ICD-10-CM

## 2023-04-15 DIAGNOSIS — R079 Chest pain, unspecified: Secondary | ICD-10-CM

## 2023-04-15 NOTE — Patient Instructions (Signed)
 Treatment of your chest pain consists of avoiding foods that cause gas (especially boiled eggs, mexcican food but especially  beans and undercooked vegetables like  spinach and some salads)  and citrucel 1 heaping TBSP  twice daily with a large glass of water.  Pain should improve w/in 2 weeks and if not then consider further GI work up.      To get the most out of exercise, you need to be continuously aware that you are short of breath, but never out of breath, for at least 30 minutes three daily. As you improve, it will actually be easier for you to do the same amount of exercise  in  30 minutes so always push to the level where you are short of breath.    Please schedule a follow up visit in 12  months but call sooner if needed   Add due to constipation rec try off spiriva x one week to see if any affect on either constipation or doe.

## 2023-04-16 ENCOUNTER — Telehealth: Payer: Self-pay | Admitting: Internal Medicine

## 2023-04-16 ENCOUNTER — Ambulatory Visit: Attending: Nurse Practitioner

## 2023-04-16 NOTE — Telephone Encounter (Signed)
 due to constipation rec try off spiriva x one week to see if any affect on either constipation or doe.   If constipation no change then ok to resume but try just one puff daily for now

## 2023-04-16 NOTE — Assessment & Plan Note (Addendum)
 Quit smoking 2013 PFT's 01/04/15  FEV1 2.15 (71 % ) ratio 58  p 14 % improvement from saba p ? prior to study with DLCO  70 % corrects to 94  % for alv volume  With only mild curvature f/v loop  - Spirometry 01/28/2017  FEV1 1.59 (53%)  Ratio 59 off all rx  - 01/28/2017    symb 160 2bid and d/c acei and double the dexilant > marked improvement 02/13/2017  PFT's  04/04/2017  FEV1 1.99 (68 % ) ratio 62  p 23 % improvement from saba p nothing prior to study with DLCO  63 % corrects to 82  % for alv volume   - 04/04/2017  After extensive coaching inhaler device  effectiveness =    90%  - 11/04/2017  Walked RA x 3 laps @ 185 ft each stopped due to  End of study, fast pace, no  Desat, mild sob  - 11/04/2017  After extensive coaching inhaler device,  effectiveness =    75% > restart symb 160 2bid - Spirometry 11/04/2017  FEV1 1.4 (47%)  Ratio 58 off all rx     - 05/20/2018  After extensive coaching inhaler device,  effectiveness = 75% with symb and 90% with SMI > try add spiriva 2 pffs each am   - 05/20/2018   Walked RA  2 laps @  approx 292ft each @ fast pace  stopped due to  End of study, no cp or sob or desats    -  08/09/2020   Walked RA  3 laps @ approx 279ft each @ fast pace  stopped due to end of study, min  sob, sats 96% at end   - 04/15/2023  After extensive coaching inhaler device,  effectiveness =    75% both hfa and dpi from a baseline of 25% - 04/15/2023   Walked on RA  x  3  lap(s) =  approx 450  ft  @ nl  pace, stopped due to end of study   with lowest 02 sats 93%    Group D (now reclassified as E) in terms of symptom/risk and laba/lama/ICS  therefore appropriate rx at this point >>>  symbicort 160 /spiriva 2.5 as long as constipation not worse due to spiriva (try one week off to see ) and continue  approp saba

## 2023-04-16 NOTE — Telephone Encounter (Signed)
 LVM for patient to return call.

## 2023-04-16 NOTE — Assessment & Plan Note (Addendum)
 Recurrent longstanding stereotypical L CP pattern assoc with variable consitpation / bloating and resolves in supine position   Rec IBS diet/ citrucel 1 tbsp bid and try off spiriva for a week.  F/u q 12 m, sooner if needed      Each maintenance medication was reviewed in detail including emphasizing most importantly the difference between maintenance and prns and under what circumstances the prns are to be triggered using an action plan format where appropriate.  Total time for H and P, chart review, counseling, reviewing hfa/ smi device(s) , directly observing portions of ambulatory 02 saturation study/ and generating customized AVS unique to this office visit / same day charting = 24 min

## 2023-04-17 NOTE — Telephone Encounter (Signed)
 Mychart message sent.

## 2023-04-17 NOTE — Telephone Encounter (Signed)
 Unable to leave VM

## 2023-04-17 NOTE — H&P (Signed)
 Surgical History & Physical  Patient Name: [name]  DOB: Aug 04, 1950  Surgery: Cataract extraction with intraocular lens implant phacoemulsification; Right Eye Surgeon: Pecolia Ades MD Surgery Date: 04/18/2023 Pre-Op Date: 03/05/2023  HPI: A 73 Yr. old male patient present for cataract eval per Dr. Daphine Deutscher. Pain in the eyes, periodically. Clear discharge.1. The patient complains of difficulty when driving on bright sunny days and at night due to glare. Difficulties reading fine print and watching TV, which began 1 year ago. Both eyes are affected. The episode is constant. The condition's severity is worsening. IDDM since around age 57. A1C has come down from 8.5 to 7.5.  Last BS 220.  Medical History: Cataracts  Arthritis Diabetes Heart Problem High Blood Pressure LDL Lung Problems  Review of Systems Cardiovascular High Blood Pressure, Irregular Heart beat, blockages Endocrine diabetes Musculoskeletal arthritis aches Respiratory COPD All recorded systems are negative except as noted above.  Social Former smoker   Medication Meclizine, Diazepam, Symbicort, Glipizide, Januvia, Lantus Solostar U-100 Insulin, Xeljanz, Dexlansoprazole, Jardiance, Spiriva Respimat, Trulance, Metoprolol tartrate, Hydrocodone-acetaminophen, Atorvastatin, Metoprolol succinate, Clopidogrel, Prednisolone acetate, Ketorolac, Ciprofloxacin HCl  Sx/Procedures By pass with stent in leg, Elbow sx  Drug Allergies  penicillin   Arthritis Diabetes Heart Problem High Blood Pressure LDL Lung Problems  History & Physical: Heent: cataracts NECK: supple without bruits LUNGS: lungs clear to auscultation CV: regular rate and rhythm Abdomen: soft and non-tender  Impression & Plan: Assessment: 1.  CATARACT NUCLEAR SCLEROSIS AGE RELATED; Both Eyes (H25.13) 2.  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  CONJUNCTIVOCHALASIS; Both Eyes (H11.823) 4.  Diabetes Type  2 No retinopathy (E11.9) 5.  Hyperopia ; Left Eye (H52.02)  Plan: 1.  Cataracts are visually significant and account for the patient's complaints. Discussed all risks, benefits, procedures and recovery, including infection, loss of vision and eye, need for glasses after surgery or additional procedures. Patient understands changing glasses will not improve vision. Patient indicated understanding of procedure. All questions answered. Patient desires to have surgery, recommend phacoemulsification with intraocular lens. Patient to have preliminary testing necessary ([employer]/IOL Master, Mac OCT, TOPO) Educational materials provided:Cataract.  Plan: - Proceed with cataract surgery OD followed by OS - Plan for best distance target with DIB00 - No DM, no fuchs, no prior eye surgery - good dilation - dextenza  2.  Dry eye. Mild signs of dry eye at this time. Can use artificial tears QID OU PRN and warm compresses once daily as needed.  3.  Artificial tears as noted above  4.  Diabetes type II : no background retinopathy, no signs of neovascularization noted. Discussed ocular and systemic benefits of blood sugar control. Last A1c 7.5 per patient  - Patient encouraged to maintain good control of blood sugar, blood pressure, and cholesterol - Patient instructed to call with any visual changes - Recommend yearly dilated exams - Letter sent to PCP/Endocrinologist  5.  Previously followed by Dr. Daphine Deutscher

## 2023-04-18 ENCOUNTER — Ambulatory Visit (HOSPITAL_COMMUNITY): Admitting: Certified Registered"

## 2023-04-18 ENCOUNTER — Ambulatory Visit (HOSPITAL_BASED_OUTPATIENT_CLINIC_OR_DEPARTMENT_OTHER): Admitting: Certified Registered"

## 2023-04-18 ENCOUNTER — Telehealth: Payer: Self-pay | Admitting: *Deleted

## 2023-04-18 ENCOUNTER — Ambulatory Visit (HOSPITAL_COMMUNITY)
Admission: RE | Admit: 2023-04-18 | Discharge: 2023-04-18 | Disposition: A | Discharge status: Home / Self Care | Attending: Optometry | Admitting: Optometry

## 2023-04-18 ENCOUNTER — Encounter (HOSPITAL_COMMUNITY)
Admission: RE | Disposition: A | Discharge status: Home / Self Care | Payer: Self-pay | Source: Home / Self Care | Attending: Optometry

## 2023-04-18 ENCOUNTER — Other Ambulatory Visit: Payer: Self-pay

## 2023-04-18 ENCOUNTER — Other Ambulatory Visit: Payer: Self-pay | Admitting: Rheumatology

## 2023-04-18 ENCOUNTER — Encounter (HOSPITAL_COMMUNITY): Payer: Self-pay | Admitting: Optometry

## 2023-04-18 ENCOUNTER — Encounter: Payer: Self-pay | Admitting: Nurse Practitioner

## 2023-04-18 DIAGNOSIS — H2511 Age-related nuclear cataract, right eye: Secondary | ICD-10-CM | POA: Diagnosis present

## 2023-04-18 DIAGNOSIS — Z87891 Personal history of nicotine dependence: Secondary | ICD-10-CM | POA: Diagnosis not present

## 2023-04-18 DIAGNOSIS — Z7951 Long term (current) use of inhaled steroids: Secondary | ICD-10-CM | POA: Diagnosis not present

## 2023-04-18 DIAGNOSIS — J449 Chronic obstructive pulmonary disease, unspecified: Secondary | ICD-10-CM | POA: Diagnosis not present

## 2023-04-18 DIAGNOSIS — K219 Gastro-esophageal reflux disease without esophagitis: Secondary | ICD-10-CM | POA: Diagnosis not present

## 2023-04-18 DIAGNOSIS — Z86718 Personal history of other venous thrombosis and embolism: Secondary | ICD-10-CM | POA: Diagnosis not present

## 2023-04-18 DIAGNOSIS — Z794 Long term (current) use of insulin: Secondary | ICD-10-CM | POA: Diagnosis not present

## 2023-04-18 DIAGNOSIS — I10 Essential (primary) hypertension: Secondary | ICD-10-CM | POA: Insufficient documentation

## 2023-04-18 DIAGNOSIS — I2510 Atherosclerotic heart disease of native coronary artery without angina pectoris: Secondary | ICD-10-CM | POA: Diagnosis not present

## 2023-04-18 DIAGNOSIS — E1136 Type 2 diabetes mellitus with diabetic cataract: Secondary | ICD-10-CM | POA: Diagnosis not present

## 2023-04-18 DIAGNOSIS — H5711 Ocular pain, right eye: Secondary | ICD-10-CM | POA: Diagnosis present

## 2023-04-18 HISTORY — PX: INSERTION, STENT, DRUG-ELUTING, LACRIMAL CANALICULUS: SHX7453

## 2023-04-18 HISTORY — PX: CATARACT EXTRACTION W/PHACO: SHX586

## 2023-04-18 LAB — GLUCOSE, CAPILLARY: Glucose-Capillary: 181 mg/dL — ABNORMAL HIGH (ref 70–99)

## 2023-04-18 SURGERY — PHACOEMULSIFICATION, CATARACT, WITH IOL INSERTION
Anesthesia: Monitor Anesthesia Care | Site: Eye | Laterality: Right

## 2023-04-18 MED ORDER — SODIUM CHLORIDE 0.9% FLUSH
INTRAVENOUS | Status: DC | PRN
Start: 2023-04-18 — End: 2023-04-18
  Administered 2023-04-18: 7 mL via INTRAVENOUS

## 2023-04-18 MED ORDER — BSS IO SOLN
INTRAOCULAR | Status: DC | PRN
  Administered 2023-04-18: 15 mL via INTRAOCULAR

## 2023-04-18 MED ORDER — TROPICAMIDE 1 % OP SOLN
1.0000 [drp] | OPHTHALMIC | Status: AC
  Administered 2023-04-18 (×3): 1 [drp] via OPHTHALMIC

## 2023-04-18 MED ORDER — MOXIFLOXACIN HCL 5 MG/ML IO SOLN
INTRAOCULAR | Status: DC | PRN
  Administered 2023-04-18: .2 mL via INTRACAMERAL

## 2023-04-18 MED ORDER — FENTANYL CITRATE (PF) 100 MCG/2ML IJ SOLN
INTRAMUSCULAR | Status: AC
  Filled 2023-04-18: qty 2

## 2023-04-18 MED ORDER — PREDNISONE 5 MG PO TABS: ORAL_TABLET | ORAL | 0 refills | Status: DC

## 2023-04-18 MED ORDER — PHENYLEPHRINE-KETOROLAC 1-0.3 % IO SOLN
INTRAOCULAR | Status: DC | PRN
  Administered 2023-04-18: 500 mL via OPHTHALMIC

## 2023-04-18 MED ORDER — DEXAMETHASONE 0.4 MG OP INST
VAGINAL_INSERT | OPHTHALMIC | Status: DC | PRN
  Administered 2023-04-18: .4 mg via OPHTHALMIC

## 2023-04-18 MED ORDER — STERILE WATER FOR IRRIGATION IR SOLN
Status: DC | PRN
  Administered 2023-04-18: 250 mL

## 2023-04-18 MED ORDER — MIDAZOLAM HCL 2 MG/2ML IJ SOLN
INTRAMUSCULAR | Status: DC | PRN
  Administered 2023-04-18: 1 mg via INTRAVENOUS

## 2023-04-18 MED ORDER — POVIDONE-IODINE 5 % OP SOLN
OPHTHALMIC | Status: DC | PRN
  Administered 2023-04-18: 1 via OPHTHALMIC

## 2023-04-18 MED ORDER — MIDAZOLAM HCL 2 MG/2ML IJ SOLN
INTRAMUSCULAR | Status: AC
  Filled 2023-04-18: qty 2

## 2023-04-18 MED ORDER — LIDOCAINE HCL 3.5 % OP GEL
1.0000 | Freq: Once | OPHTHALMIC | Status: AC
  Administered 2023-04-18: 1 via OPHTHALMIC

## 2023-04-18 MED ORDER — LIDOCAINE HCL (PF) 1 % IJ SOLN
INTRAMUSCULAR | Status: DC | PRN
  Administered 2023-04-18: 1 mL

## 2023-04-18 MED ORDER — SIGHTPATH DOSE#1 NA HYALUR & NA CHOND-NA HYALUR IO KIT
PACK | INTRAOCULAR | Status: DC | PRN
  Administered 2023-04-18: 1 via OPHTHALMIC

## 2023-04-18 MED ORDER — DEXAMETHASONE 0.4 MG OP INST
VAGINAL_INSERT | OPHTHALMIC | Status: AC
  Filled 2023-04-18: qty 1

## 2023-04-18 MED ORDER — TETRACAINE HCL 0.5 % OP SOLN
1.0000 [drp] | OPHTHALMIC | Status: AC
  Administered 2023-04-18 (×3): 1 [drp] via OPHTHALMIC

## 2023-04-18 MED ORDER — FENTANYL CITRATE (PF) 100 MCG/2ML IJ SOLN
INTRAMUSCULAR | Status: DC | PRN
  Administered 2023-04-18: 50 ug via INTRAVENOUS

## 2023-04-18 MED ORDER — PHENYLEPHRINE HCL 2.5 % OP SOLN
1.0000 [drp] | OPHTHALMIC | Status: AC
  Administered 2023-04-18 (×3): 1 [drp] via OPHTHALMIC

## 2023-04-18 SURGICAL SUPPLY — 14 items
CATARACT SUITE SIGHTPATH (MISCELLANEOUS) ×1 IMPLANT
CLOTH BEACON ORANGE TIMEOUT ST (SAFETY) ×1 IMPLANT
DRSG TEGADERM 4X4.75 (GAUZE/BANDAGES/DRESSINGS) ×1 IMPLANT
EYE SHIELD UNIVERSAL CLEAR (GAUZE/BANDAGES/DRESSINGS) IMPLANT
FEE CATARACT SUITE SIGHTPATH (MISCELLANEOUS) ×1 IMPLANT
GLOVE BIOGEL PI IND STRL 7.0 (GLOVE) ×2 IMPLANT
LENS IOL TECNIS EYHANCE 22.0 (Intraocular Lens) IMPLANT
NDL HYPO 18GX1.5 BLUNT FILL (NEEDLE) ×1 IMPLANT
NEEDLE HYPO 18GX1.5 BLUNT FILL (NEEDLE) ×1 IMPLANT
PAD ARMBOARD POSITIONER FOAM (MISCELLANEOUS) ×1 IMPLANT
POSITIONER HEAD 8X9X4 ADT (SOFTGOODS) ×1 IMPLANT
SYR TB 1ML LL NO SAFETY (SYRINGE) ×1 IMPLANT
TAPE SURG TRANSPORE 1 IN (GAUZE/BANDAGES/DRESSINGS) IMPLANT
WATER STERILE IRR 250ML POUR (IV SOLUTION) ×1 IMPLANT

## 2023-04-18 NOTE — Telephone Encounter (Signed)
 I returned patient's call but did not get a response.  His answering machine is full.  I will send a prescription for prednisone 20 mg p.o. daily and taper by 5 mg every 4 days.  He should be able to restart [name] after 1-2  weeks after the surgery if he gets clearance by his ophthalmologist.  Please notify him on Monday.

## 2023-04-18 NOTE — Op Note (Signed)
 Date of procedure: 04/18/23  Pre-operative diagnosis: Visually significant age-related nuclear cataract, Right Eye (H25.11)  Post-operative diagnosis: Visually significant age-related nuclear cataract, Right Eye  Procedure: Removal of cataract via phacoemulsification and insertion of intra-ocular lens J&J DIBOO +22.0D into the capsular bag of the Right Eye  Attending surgeon: Pecolia Ades, MD  Anesthesia: MAC, Topical Akten  Complications: None  Estimated Blood Loss: <55mL (minimal)  Specimens: None  Implants:  Implant Name Type Inv. Item Serial No. Manufacturer Lot No. LRB No. Used Action  LENS IOL TECNIS EYHANCE 22.0 - [id number] Intraocular Lens LENS IOL TECNIS EYHANCE 22.0 [id number] [employer]  Right 1 Implanted    Indications:  Visually significant age-related cataract, Right Eye  Procedure:  The patient was seen and identified in the pre-operative area. The operative eye was identified and dilated.  The operative eye was marked.  Topical anesthesia was administered to the operative eye.     The patient was then to the operative suite and placed in the supine position.  A timeout was performed confirming the patient, procedure to be performed, and all other relevant information.   The patient's face was prepped and draped in the usual fashion for intra-ocular surgery.  A lid speculum was placed into the operative eye and the surgical microscope moved into place and focused.  A superotemporal paracentesis was created using a 20 gauge paracentesis blade.  BSS mixed with Omidria, followed by 1% lidocaine was injected into the anterior chamber.  Viscoelastic was injected into the anterior chamber.  A temporal clear-corneal main wound incision was created using a 2.34mm microkeratome.  A continuous curvilinear capsulorrhexis was initiated using an irrigating cystitome and completed using capsulorrhexis forceps.  Hydrodissection and hydrodeliniation were performed.  Viscoelastic was  injected into the anterior chamber.  A phacoemulsification handpiece and a chopper as a second instrument were used to remove the nucleus and epinucleus. The irrigation/aspiration handpiece was used to remove any remaining cortical material.   The capsular bag was reinflated with viscoelastic, checked, and found to be intact.  The intraocular lens was inserted into the capsular bag.  The irrigation/aspiration handpiece was used to remove any remaining viscoelastic.  The clear corneal wound and paracentesis wounds were then hydrated and checked with Weck-Cels to be watertight. Moxifloxacin was instilled into the anterior chamber.  The lid-speculum and drape were removed. The lower punctum was dilated, and the dextenza implant was inserted into it. The patient's face was cleaned with a wet and dry 4x4. A clear shield was taped over the eye. The patient was taken to the post-operative care unit in good condition, having tolerated the procedure well.  Post-Op Instructions: The patient will follow up at [facility] for a same day post-operative evaluation and will receive all other orders and instructions.

## 2023-04-18 NOTE — Telephone Encounter (Signed)
 Patient states he is having pain in his legs, arms, hands, feet, ankles and elbows. Patient states he is having trouble walking, Patient states he has swelling in arms and ankles. Patient states he has been out of his [name] for 3 days. Patient did have cataract surgery today. Patient would like to know what he can do for the pain and swelling in his joints. Please advise.

## 2023-04-18 NOTE — Anesthesia Procedure Notes (Signed)
 Procedure Name: MAC Date/Time: 04/18/2023 8:02 AM  Performed by: Julian Reil, CRNAPre-anesthesia Checklist: Patient identified, Emergency Drugs available, Suction available and Patient being monitored Patient Re-evaluated:Patient Re-evaluated prior to induction Oxygen Delivery Method: Nasal cannula Placement Confirmation: positive ETCO2

## 2023-04-18 NOTE — Telephone Encounter (Signed)
 Patient called requesting [name], patient stated [facility] cannot get Xeljanz anymore. Patient requested RX be sent to [facility] or [facility] in [place], does patient need new PA? Patient is out of medication, patient had eye surgery today.Thank you.

## 2023-04-18 NOTE — Anesthesia Preprocedure Evaluation (Signed)
 Anesthesia Evaluation  Patient identified by MRN, date of birth, ID band Patient awake    Reviewed: Allergy & Precautions, H&P , NPO status , Patient's Chart, lab work & pertinent test results, reviewed documented beta blocker date and time   Airway Mallampati: II  TM Distance: >3 FB Neck ROM: full    Dental no notable dental hx.    Pulmonary COPD, former smoker   Pulmonary exam normal breath sounds clear to auscultation       Cardiovascular Exercise Tolerance: Good hypertension, + CAD, + Peripheral Vascular Disease and + DOE   Rhythm:regular Rate:Normal     Neuro/Psych  PSYCHIATRIC DISORDERS       Neuromuscular disease    GI/Hepatic Neg liver ROS,GERD  ,,  Endo/Other  diabetes    Renal/GU negative Renal ROS  negative genitourinary   Musculoskeletal   Abdominal   Peds  Hematology  (+) Blood dyscrasia, anemia   Anesthesia Other Findings   Reproductive/Obstetrics negative OB ROS                             Anesthesia Physical Anesthesia Plan  ASA: 3  Anesthesia Plan: MAC   Post-op Pain Management:    Induction:   PONV Risk Score and Plan:   Airway Management Planned:   Additional Equipment:   Intra-op Plan:   Post-operative Plan:   Informed Consent: I have reviewed the patients History and Physical, chart, labs and discussed the procedure including the risks, benefits and alternatives for the proposed anesthesia with the patient or authorized representative who has indicated his/her understanding and acceptance.     Dental Advisory Given  Plan Discussed with: CRNA  Anesthesia Plan Comments:        Anesthesia Quick Evaluation

## 2023-04-18 NOTE — Discharge Instructions (Signed)
 Go directly to Dr. Barkley Bruns office. Leave eye shield in place until you get there.

## 2023-04-18 NOTE — Transfer of Care (Signed)
 Immediate Anesthesia Transfer of Care Note  Patient: [name]  Procedure(s) Performed: PHACOEMULSIFICATION, CATARACT, WITH IOL INSERTION (Right: Eye) INSERTION, STENT, DRUG-ELUTING, LACRIMAL CANALICULUS (Right: Eye)  Patient Location: Short Stay  Anesthesia Type:MAC  Level of Consciousness: awake, alert , and oriented  Airway & Oxygen Therapy: Patient Spontanous Breathing  Post-op Assessment: Report given to RN and Post -op Vital signs reviewed and stable  Post vital signs: Reviewed and stable  Last Vitals:  Vitals Value Taken Time  BP    Temp    Pulse    Resp    SpO2      Last Pain:  Vitals:   04/18/23 0719  TempSrc: Oral  PainSc: 0-No pain      Patients Stated Pain Goal: 4 (04/18/23 0719)  Complications: No notable events documented.

## 2023-04-18 NOTE — Interval H&P Note (Signed)
 History and Physical Interval Note:  04/18/2023 7:55 AM  The H and P was reviewed and updated. The patient was examined.  No changes were found after exam.  The surgical eye was marked.    [name]

## 2023-04-21 ENCOUNTER — Encounter (HOSPITAL_COMMUNITY): Payer: Self-pay | Admitting: Optometry

## 2023-04-21 NOTE — Telephone Encounter (Signed)
 Patient advised Dr. Alvira Josephs returned his call but did not get a response.  His answering machine is full. Patient advised she sent in a prescription for prednisone 20 mg p.o. daily and taper by 5 mg every 4 days.  He should be able to restart Xeljanz after 1-2  weeks after the surgery if he gets clearance by his ophthalmologist.

## 2023-04-21 NOTE — Telephone Encounter (Signed)
 Submitted a Prior Authorization RENEWAL request to [employer] for XELJANZ via CoverMyMeds. Will update once we receive a response.  Key: [id number]

## 2023-04-23 ENCOUNTER — Other Ambulatory Visit (HOSPITAL_COMMUNITY): Payer: Self-pay

## 2023-04-23 NOTE — Telephone Encounter (Signed)
 PA for Cloria Danger cancelled because previously approved from 01/08/2023 through 01/07/2024.  Unable to run test claim because it was last filled on 04/21/2023 by another pharmacy but unsure where.  Called patient and he does not know where it's filled. Called [facility] to determine if rx was transferred to another pharmacy for us  to follow-up. They said it was billed at [facility] and not cancelled  Patient is currently holding Cloria Danger due to recent eye procedure.  Please send rx to [facility] once he is cleared by ophthalmology to resume. We will get medication to him. Thanks!  Geraldene Kleine, PharmD, MPH, BCPS, CPP Clinical Pharmacist (Rheumatology and Pulmonology)

## 2023-04-24 ENCOUNTER — Encounter: Payer: Self-pay | Admitting: Physician Assistant

## 2023-04-24 ENCOUNTER — Ambulatory Visit: Payer: 59 | Attending: Physician Assistant | Admitting: Physician Assistant

## 2023-04-24 VITALS — BP 130/78 | HR 84 | Resp 15 | Ht 69.0 in | Wt 169.4 lb

## 2023-04-24 DIAGNOSIS — M19071 Primary osteoarthritis, right ankle and foot: Secondary | ICD-10-CM

## 2023-04-24 DIAGNOSIS — Z79899 Other long term (current) drug therapy: Secondary | ICD-10-CM

## 2023-04-24 DIAGNOSIS — I739 Peripheral vascular disease, unspecified: Secondary | ICD-10-CM

## 2023-04-24 DIAGNOSIS — E782 Mixed hyperlipidemia: Secondary | ICD-10-CM

## 2023-04-24 DIAGNOSIS — M25562 Pain in left knee: Secondary | ICD-10-CM

## 2023-04-24 DIAGNOSIS — M19041 Primary osteoarthritis, right hand: Secondary | ICD-10-CM

## 2023-04-24 DIAGNOSIS — Z9889 Other specified postprocedural states: Secondary | ICD-10-CM

## 2023-04-24 DIAGNOSIS — G8929 Other chronic pain: Secondary | ICD-10-CM

## 2023-04-24 DIAGNOSIS — M19072 Primary osteoarthritis, left ankle and foot: Secondary | ICD-10-CM

## 2023-04-24 DIAGNOSIS — I10 Essential (primary) hypertension: Secondary | ICD-10-CM

## 2023-04-24 DIAGNOSIS — Z8639 Personal history of other endocrine, nutritional and metabolic disease: Secondary | ICD-10-CM

## 2023-04-24 DIAGNOSIS — M24521 Contracture, right elbow: Secondary | ICD-10-CM | POA: Diagnosis not present

## 2023-04-24 DIAGNOSIS — J449 Chronic obstructive pulmonary disease, unspecified: Secondary | ICD-10-CM

## 2023-04-24 DIAGNOSIS — M19042 Primary osteoarthritis, left hand: Secondary | ICD-10-CM

## 2023-04-24 DIAGNOSIS — M0609 Rheumatoid arthritis without rheumatoid factor, multiple sites: Secondary | ICD-10-CM | POA: Diagnosis not present

## 2023-04-24 DIAGNOSIS — I2510 Atherosclerotic heart disease of native coronary artery without angina pectoris: Secondary | ICD-10-CM

## 2023-04-24 DIAGNOSIS — M5030 Other cervical disc degeneration, unspecified cervical region: Secondary | ICD-10-CM

## 2023-04-24 DIAGNOSIS — M75101 Unspecified rotator cuff tear or rupture of right shoulder, not specified as traumatic: Secondary | ICD-10-CM | POA: Diagnosis not present

## 2023-04-24 DIAGNOSIS — M24522 Contracture, left elbow: Secondary | ICD-10-CM

## 2023-04-24 DIAGNOSIS — R002 Palpitations: Secondary | ICD-10-CM

## 2023-04-24 NOTE — Patient Instructions (Signed)

## 2023-04-28 NOTE — Anesthesia Postprocedure Evaluation (Signed)
 Anesthesia Post Note  Patient: [name]  Procedure(s) Performed: PHACOEMULSIFICATION, CATARACT, WITH IOL INSERTION (Right: Eye) INSERTION, STENT, DRUG-ELUTING, LACRIMAL CANALICULUS (Right: Eye)  Patient location during evaluation: Phase II Anesthesia Type: MAC Level of consciousness: awake Pain management: pain level controlled Vital Signs Assessment: post-procedure vital signs reviewed and stable Respiratory status: spontaneous breathing and respiratory function stable Cardiovascular status: blood pressure returned to baseline and stable Postop Assessment: no headache and no apparent nausea or vomiting Anesthetic complications: no Comments: Late entry   No notable events documented.   Last Vitals:  Vitals:   04/18/23 0719 04/18/23 0824  BP: 121/85 118/78  Pulse: 90 88  Resp: 17 16  Temp: 36.7 C 36.6 C  SpO2: 97% 97%    Last Pain:  Vitals:   04/21/23 1552  TempSrc:   PainSc: 0-No pain                 [name]

## 2023-04-29 NOTE — H&P (Signed)
 Surgical History & Physical  Patient Name: [name]  DOB: 11/20/1950  Surgery: Cataract extraction with intraocular lens implant phacoemulsification; Left Eye Surgeon: Ardeth Krabbe MD Surgery Date: 05/02/2023 Pre-Op Date: 03/05/2023  HPI: A 72 Yr. old male patient present for cataract eval per Dr. Gaylyn Keas. Pain in the eyes, periodically. Clear discharge.1. The patient complains of difficulty when driving on bright sunny days and at night due to glare. Difficulties reading fine print and watching TV, which began 1 year ago. Both eyes are affected. The episode is constant. The condition's severity is worsening. IDDM since around age 32. A1C has come down from 8.5 to 7.5. Last BS 220.  Medical History: Cataracts  Arthritis Diabetes Heart Problem High Blood Pressure LDL Lung Problems  Review of Systems Cardiovascular High Blood Pressure, Irregular Heart beat, blockages Endocrine diabetes Musculoskeletal arthritis aches Respiratory COPD All recorded systems are negative except as noted above.  Social Former smoker  Medication Prednisolone acetate, Ketorolac , Ciprofloxacin HCl,  Meclizine, Diazepam , Symbicort , Glipizide, Januvia, Lantus Solostar U-100 Insulin, Xeljanz , Dexlansoprazole , Jardiance, Spiriva  Respimat, Trulance , Metoprolol  tartrate, Hydrocodone-acetaminophen , Atorvastatin , Metoprolol  succinate, Clopidogrel   Sx/Procedures Phaco c IOL OD - Dextenza ,  By pass with stent in leg, Elbow sx  Drug Allergies  penicillin   History & Physical: Heent: cataracts NECK: supple without bruits LUNGS: lungs clear to auscultation CV: regular rate and rhythm Abdomen: soft and non-tender  Impression & Plan: Assessment: 1.  CATARACT NUCLEAR SCLEROSIS AGE RELATED; Both Eyes (H25.13) 2.  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  CONJUNCTIVOCHALASIS; Both Eyes (H11.823) 4.  Diabetes Type 2 No retinopathy (E11.9) 5.  Hyperopia ; Left Eye  (H52.02)  Plan: 1.  Cataracts are visually significant and account for the patient's complaints. Discussed all risks, benefits, procedures and recovery, including infection, loss of vision and eye, need for glasses after surgery or additional procedures. Patient understands changing glasses will not improve vision. Patient indicated understanding of procedure. All questions answered. Patient desires to have surgery, recommend phacoemulsification with intraocular lens. Patient to have preliminary testing necessary (Argos/IOL Master, Mac OCT, TOPO) Educational materials provided:Cataract.  Plan: - Proceed with cataract surgery OD followed by OS - Plan for best distance target with DIB00 - No DM, no fuchs, no prior eye surgery - good dilation - dextenza   2.  Dry eye. Mild signs of dry eye at this time. Can use artificial tears QID OU PRN and warm compresses once daily as needed.  3.  Artificial tears as noted above  4.  Diabetes type II : no background retinopathy, no signs of neovascularization noted. Discussed ocular and systemic benefits of blood sugar control. Last A1c 7.5 per patient  - Patient encouraged to maintain good control of blood sugar, blood pressure, and cholesterol - Patient instructed to call with any visual changes - Recommend yearly dilated exams - Letter sent to PCP/Endocrinologist  5.  Previously followed by Dr. Gaylyn Keas

## 2023-04-30 ENCOUNTER — Encounter (HOSPITAL_COMMUNITY)
Admission: RE | Admit: 2023-04-30 | Discharge: 2023-04-30 | Disposition: A | Discharge status: Home / Self Care | Source: Ambulatory Visit | Attending: Optometry | Admitting: Optometry

## 2023-04-30 ENCOUNTER — Encounter (HOSPITAL_COMMUNITY): Payer: Self-pay

## 2023-05-02 ENCOUNTER — Other Ambulatory Visit: Payer: Self-pay

## 2023-05-02 ENCOUNTER — Ambulatory Visit (HOSPITAL_COMMUNITY)
Admission: RE | Admit: 2023-05-02 | Discharge: 2023-05-02 | Disposition: A | Discharge status: Home / Self Care | Attending: Optometry | Admitting: Optometry

## 2023-05-02 ENCOUNTER — Ambulatory Visit (HOSPITAL_BASED_OUTPATIENT_CLINIC_OR_DEPARTMENT_OTHER): Admitting: Certified Registered"

## 2023-05-02 ENCOUNTER — Ambulatory Visit (HOSPITAL_COMMUNITY): Admitting: Certified Registered"

## 2023-05-02 ENCOUNTER — Encounter (HOSPITAL_COMMUNITY): Payer: Self-pay | Admitting: Optometry

## 2023-05-02 ENCOUNTER — Encounter (HOSPITAL_COMMUNITY)
Admission: RE | Disposition: A | Discharge status: Home / Self Care | Payer: Self-pay | Source: Home / Self Care | Attending: Optometry

## 2023-05-02 DIAGNOSIS — Z794 Long term (current) use of insulin: Secondary | ICD-10-CM | POA: Insufficient documentation

## 2023-05-02 DIAGNOSIS — E1136 Type 2 diabetes mellitus with diabetic cataract: Secondary | ICD-10-CM | POA: Insufficient documentation

## 2023-05-02 DIAGNOSIS — Z87891 Personal history of nicotine dependence: Secondary | ICD-10-CM | POA: Diagnosis not present

## 2023-05-02 DIAGNOSIS — H2512 Age-related nuclear cataract, left eye: Secondary | ICD-10-CM | POA: Diagnosis present

## 2023-05-02 DIAGNOSIS — J449 Chronic obstructive pulmonary disease, unspecified: Secondary | ICD-10-CM | POA: Insufficient documentation

## 2023-05-02 DIAGNOSIS — E1151 Type 2 diabetes mellitus with diabetic peripheral angiopathy without gangrene: Secondary | ICD-10-CM | POA: Diagnosis not present

## 2023-05-02 DIAGNOSIS — K219 Gastro-esophageal reflux disease without esophagitis: Secondary | ICD-10-CM | POA: Insufficient documentation

## 2023-05-02 DIAGNOSIS — Z7951 Long term (current) use of inhaled steroids: Secondary | ICD-10-CM | POA: Diagnosis not present

## 2023-05-02 DIAGNOSIS — I2510 Atherosclerotic heart disease of native coronary artery without angina pectoris: Secondary | ICD-10-CM | POA: Insufficient documentation

## 2023-05-02 DIAGNOSIS — H5712 Ocular pain, left eye: Secondary | ICD-10-CM | POA: Diagnosis present

## 2023-05-02 DIAGNOSIS — H5202 Hypermetropia, left eye: Secondary | ICD-10-CM | POA: Insufficient documentation

## 2023-05-02 DIAGNOSIS — I10 Essential (primary) hypertension: Secondary | ICD-10-CM | POA: Diagnosis not present

## 2023-05-02 DIAGNOSIS — H11823 Conjunctivochalasis, bilateral: Secondary | ICD-10-CM | POA: Insufficient documentation

## 2023-05-02 DIAGNOSIS — Z7984 Long term (current) use of oral hypoglycemic drugs: Secondary | ICD-10-CM | POA: Diagnosis not present

## 2023-05-02 HISTORY — PX: CATARACT EXTRACTION W/PHACO: SHX586

## 2023-05-02 HISTORY — PX: INSERTION, STENT, DRUG-ELUTING, LACRIMAL CANALICULUS: SHX7453

## 2023-05-02 LAB — GLUCOSE, CAPILLARY: Glucose-Capillary: 194 mg/dL — ABNORMAL HIGH (ref 70–99)

## 2023-05-02 SURGERY — PHACOEMULSIFICATION, CATARACT, WITH IOL INSERTION
Anesthesia: Monitor Anesthesia Care | Site: Eye | Laterality: Left

## 2023-05-02 MED ORDER — BSS IO SOLN
INTRAOCULAR | Status: DC | PRN
  Administered 2023-05-02: 15 mL via INTRAOCULAR

## 2023-05-02 MED ORDER — BSS IO SOLN
INTRAOCULAR | Status: DC | PRN
  Administered 2023-05-02: 500 mL via OPHTHALMIC

## 2023-05-02 MED ORDER — TETRACAINE HCL 0.5 % OP SOLN
1.0000 [drp] | OPHTHALMIC | Status: AC | PRN
  Administered 2023-05-02 (×3): 1 [drp] via OPHTHALMIC

## 2023-05-02 MED ORDER — DEXAMETHASONE 0.4 MG OP INST
VAGINAL_INSERT | OPHTHALMIC | Status: DC | PRN
  Administered 2023-05-02: .4 mg via OPHTHALMIC

## 2023-05-02 MED ORDER — MIDAZOLAM HCL 2 MG/2ML IJ SOLN
INTRAMUSCULAR | Status: DC | PRN
  Administered 2023-05-02: 2 mg via INTRAVENOUS

## 2023-05-02 MED ORDER — SODIUM CHLORIDE 0.9% FLUSH
3.0000 mL | Freq: Two times a day (BID) | INTRAVENOUS | Status: DC
Start: 2023-05-02 — End: 2023-05-02

## 2023-05-02 MED ORDER — MOXIFLOXACIN HCL 5 MG/ML IO SOLN
INTRAOCULAR | Status: DC | PRN
  Administered 2023-05-02: .2 mL via OPHTHALMIC

## 2023-05-02 MED ORDER — SODIUM CHLORIDE 0.9% FLUSH: 3.0000 mL | INTRAVENOUS | Status: DC | PRN

## 2023-05-02 MED ORDER — MIDAZOLAM HCL 2 MG/2ML IJ SOLN
INTRAMUSCULAR | Status: AC
  Filled 2023-05-02: qty 2

## 2023-05-02 MED ORDER — POVIDONE-IODINE 5 % OP SOLN
OPHTHALMIC | Status: DC | PRN
  Administered 2023-05-02: 1 via OPHTHALMIC

## 2023-05-02 MED ORDER — PHENYLEPHRINE HCL 2.5 % OP SOLN
1.0000 [drp] | OPHTHALMIC | Status: AC | PRN
  Administered 2023-05-02 (×3): 1 [drp] via OPHTHALMIC

## 2023-05-02 MED ORDER — LIDOCAINE HCL (PF) 1 % IJ SOLN
INTRAMUSCULAR | Status: DC | PRN
  Administered 2023-05-02: 2 mL

## 2023-05-02 MED ORDER — DEXAMETHASONE 0.4 MG OP INST
VAGINAL_INSERT | OPHTHALMIC | Status: AC
  Filled 2023-05-02: qty 1

## 2023-05-02 MED ORDER — STERILE WATER FOR IRRIGATION IR SOLN
Status: DC | PRN
  Administered 2023-05-02: 250 mL

## 2023-05-02 MED ORDER — TROPICAMIDE 1 % OP SOLN
1.0000 [drp] | OPHTHALMIC | Status: AC | PRN
  Administered 2023-05-02 (×3): 1 [drp] via OPHTHALMIC

## 2023-05-02 MED ORDER — LIDOCAINE HCL 3.5 % OP GEL
1.0000 | Freq: Once | OPHTHALMIC | Status: AC
  Administered 2023-05-02: 1 via OPHTHALMIC

## 2023-05-02 MED ORDER — SIGHTPATH DOSE#1 NA HYALUR & NA CHOND-NA HYALUR IO KIT
PACK | INTRAOCULAR | Status: DC | PRN
  Administered 2023-05-02: 1 via OPHTHALMIC

## 2023-05-02 MED ORDER — TETRACAINE 0.5 % OP SOLN OPTIME - NO CHARGE
OPHTHALMIC | Status: DC | PRN
  Administered 2023-05-02: 1 [drp] via OPHTHALMIC

## 2023-05-02 SURGICAL SUPPLY — 14 items
CATARACT SUITE SIGHTPATH (MISCELLANEOUS) ×1 IMPLANT
CLOTH BEACON ORANGE TIMEOUT ST (SAFETY) ×1 IMPLANT
DRSG TEGADERM 4X4.75 (GAUZE/BANDAGES/DRESSINGS) ×1 IMPLANT
EYE SHIELD UNIVERSAL CLEAR (GAUZE/BANDAGES/DRESSINGS) IMPLANT
FEE CATARACT SUITE SIGHTPATH (MISCELLANEOUS) ×1 IMPLANT
GLOVE BIOGEL PI IND STRL 7.0 (GLOVE) ×2 IMPLANT
LENS IOL TECNIS EYHANCE 23.0 (Intraocular Lens) IMPLANT
NDL HYPO 18GX1.5 BLUNT FILL (NEEDLE) ×1 IMPLANT
NEEDLE HYPO 18GX1.5 BLUNT FILL (NEEDLE) ×1 IMPLANT
PAD ARMBOARD POSITIONER FOAM (MISCELLANEOUS) ×1 IMPLANT
POSITIONER HEAD 8X9X4 ADT (SOFTGOODS) ×1 IMPLANT
SYR TB 1ML LL NO SAFETY (SYRINGE) ×1 IMPLANT
TAPE SURG TRANSPORE 1 IN (GAUZE/BANDAGES/DRESSINGS) IMPLANT
WATER STERILE IRR 250ML POUR (IV SOLUTION) ×1 IMPLANT

## 2023-05-02 NOTE — Anesthesia Postprocedure Evaluation (Signed)
 Anesthesia Post Note  Patient: [name]  Procedure(s) Performed: PHACOEMULSIFICATION, CATARACT, WITH IOL INSERTION (Left: Eye) INSERTION, STENT, DRUG-ELUTING, LACRIMAL CANALICULUS (Left: Eye)  Patient location during evaluation: Phase II Anesthesia Type: MAC Level of consciousness: awake Pain management: pain level controlled Vital Signs Assessment: post-procedure vital signs reviewed and stable Respiratory status: spontaneous breathing and respiratory function stable Cardiovascular status: blood pressure returned to baseline and stable Postop Assessment: no headache and no apparent nausea or vomiting Anesthetic complications: no Comments: Late entry   No notable events documented.   Last Vitals:  Vitals:   05/02/23 1135 05/02/23 1310  BP:  124/83  Pulse: 89 93  Resp: 14 16  Temp: (!) 34.4 C 36.5 C  SpO2: 94% 97%    Last Pain:  Vitals:   05/02/23 1310  TempSrc: Oral  PainSc: 0-No pain                 [name]

## 2023-05-02 NOTE — Op Note (Signed)
 Date of procedure: 05/02/23  Pre-operative diagnosis: Visually significant age-related nuclear cataract, Left Eye (H25.12)  Post-operative diagnosis: Visually significant age-related nuclear cataract, Left Eye  Procedure: Removal of cataract via phacoemulsification and insertion of intra-ocular lens J&J DIB00 +23.0D into the capsular bag of the Left Eye  Attending surgeon: Gale Jude, MD  Anesthesia: MAC, Topical Akten   Complications: None  Estimated Blood Loss: <71mL (minimal)  Specimens: None  Implants:  Implant Name Type Inv. Item Serial No. Manufacturer Lot No. LRB No. Used Action  LENS IOL TECNIS EYHANCE 23.0 - [id number] Intraocular Lens LENS IOL TECNIS EYHANCE 23.0 [id number] [employer]  Left 1 Implanted    Indications:  Visually significant age-related cataract, Left Eye  Procedure:  The patient was seen and identified in the pre-operative area. The operative eye was identified and dilated.  The operative eye was marked.  Topical anesthesia was administered to the operative eye.     The patient was then to the operative suite and placed in the supine position.  A timeout was performed confirming the patient, procedure to be performed, and all other relevant information.   The patient's face was prepped and draped in the usual fashion for intra-ocular surgery.  A lid speculum was placed into the operative eye and the surgical microscope moved into place and focused.  An inferotemporal paracentesis was created using a 20 gauge paracentesis blade.  BSS mixed with Omidria , followed by 1% lidocaine  was injected into the anterior chamber.  Viscoelastic was injected into the anterior chamber.  A temporal clear-corneal main wound incision was created using a 2.1mm microkeratome.  A continuous curvilinear capsulorrhexis was initiated using an irrigating cystitome and completed using capsulorrhexis forceps.  Hydrodissection and hydrodeliniation were performed.  Viscoelastic was  injected into the anterior chamber.  A phacoemulsification handpiece and a chopper as a second instrument were used to remove the nucleus and epinucleus. The irrigation/aspiration handpiece was used to remove any remaining cortical material.   The capsular bag was reinflated with viscoelastic, checked, and found to be intact.  The intraocular lens was inserted into the capsular bag.  The irrigation/aspiration handpiece was used to remove any remaining viscoelastic.  The clear corneal wound and paracentesis wounds were then hydrated and checked with Weck-Cels to be watertight. Moxifloxacin  was instilled into the anterior chamber.  The lid-speculum and drape were removed. The lower punctum was dilated, and the dextenza  implant was inserted into it. The patient's face was cleaned with a wet and dry 4x4.  A clear shield was taped over the eye. The patient was taken to the post-operative care unit in good condition, having tolerated the procedure well.  Post-Op Instructions: The patient will follow up at [facility] for a same day post-operative evaluation and will receive all other orders and instructions.

## 2023-05-02 NOTE — Interval H&P Note (Signed)
 History and Physical Interval Note:  05/02/2023 11:53 AM  The H and P was reviewed and updated. The patient was examined.  No changes were found after exam.  The surgical eye was marked.  [name]

## 2023-05-02 NOTE — Discharge Instructions (Signed)
 Please discharge patient when stable, will follow up today with Dr. Ilsa Iha at the [facility] office immediately following discharge.  Leave shield in place until visit.  All paperwork with discharge instructions will be given at the office.  [facility] Address:  [address]  [place], [place] [address]  Dr. Chaya Jan Phone: [phone]

## 2023-05-02 NOTE — Anesthesia Preprocedure Evaluation (Signed)
 Anesthesia Evaluation  Patient identified by MRN, date of birth, ID band Patient awake    Reviewed: Allergy & Precautions, H&P , NPO status , Patient's Chart, lab work & pertinent test results, reviewed documented beta blocker date and time   Airway Mallampati: II  TM Distance: >3 FB Neck ROM: full    Dental no notable dental hx.    Pulmonary COPD, former smoker   Pulmonary exam normal breath sounds clear to auscultation       Cardiovascular Exercise Tolerance: Good hypertension, + CAD, + Peripheral Vascular Disease and + DOE   Rhythm:regular Rate:Normal     Neuro/Psych  PSYCHIATRIC DISORDERS       Neuromuscular disease    GI/Hepatic Neg liver ROS,GERD  ,,  Endo/Other  diabetes    Renal/GU negative Renal ROS  negative genitourinary   Musculoskeletal   Abdominal   Peds  Hematology  (+) Blood dyscrasia, anemia   Anesthesia Other Findings   Reproductive/Obstetrics negative OB ROS                             Anesthesia Physical Anesthesia Plan  ASA: 3  Anesthesia Plan: MAC   Post-op Pain Management:    Induction:   PONV Risk Score and Plan:   Airway Management Planned:   Additional Equipment:   Intra-op Plan:   Post-operative Plan:   Informed Consent: I have reviewed the patients History and Physical, chart, labs and discussed the procedure including the risks, benefits and alternatives for the proposed anesthesia with the patient or authorized representative who has indicated his/her understanding and acceptance.     Dental Advisory Given  Plan Discussed with: CRNA  Anesthesia Plan Comments:        Anesthesia Quick Evaluation

## 2023-05-02 NOTE — Transfer of Care (Signed)
 Immediate Anesthesia Transfer of Care Note  Patient: Missael Ferrari  Procedure(s) Performed: PHACOEMULSIFICATION, CATARACT, WITH IOL INSERTION (Left: Eye) INSERTION, STENT, DRUG-ELUTING, LACRIMAL CANALICULUS (Left: Eye)  Patient Location: PACU  Anesthesia Type:MAC  Level of Consciousness: awake, alert , oriented, and patient cooperative  Airway & Oxygen Therapy: Patient Spontanous Breathing  Post-op Assessment: Report given to RN, Post -op Vital signs reviewed and stable, and Patient moving all extremities X 4  Post vital signs: Reviewed and stable  Last Vitals:  Vitals Value Taken Time  BP 124/83 05/02/23 1310  Temp 36.5 C 05/02/23 1310  Pulse 93 05/02/23 1310  Resp 16 05/02/23 1310  SpO2 97 % 05/02/23 1310    Last Pain:  Vitals:   05/02/23 1310  TempSrc: Oral  PainSc: 0-No pain         Complications: No notable events documented.

## 2023-05-05 ENCOUNTER — Encounter (HOSPITAL_COMMUNITY): Payer: Self-pay | Admitting: Optometry

## 2023-05-09 ENCOUNTER — Other Ambulatory Visit: Payer: Self-pay | Admitting: Emergency Medicine

## 2023-05-09 DIAGNOSIS — Z122 Encounter for screening for malignant neoplasm of respiratory organs: Secondary | ICD-10-CM

## 2023-05-09 DIAGNOSIS — Z87891 Personal history of nicotine dependence: Secondary | ICD-10-CM

## 2023-05-14 ENCOUNTER — Ambulatory Visit: Attending: Cardiology

## 2023-05-14 DIAGNOSIS — I77810 Thoracic aortic ectasia: Secondary | ICD-10-CM

## 2023-05-15 LAB — ECHOCARDIOGRAM COMPLETE
AR max vel: 2.82 cm2
AV Area VTI: 3.76 cm2
AV Area mean vel: 3.31 cm2
AV Mean grad: 6 mmHg
AV Peak grad: 10.5 mmHg
AV Vena cont: 0.5 cm
Ao pk vel: 1.62 m/s
Area-P 1/2: 4.6 cm2
Calc EF: 45.4 %
MV VTI: 6.42 cm2
P 1/2 time: 382 ms
S' Lateral: 3.9 cm
Single Plane A2C EF: 37.8 %
Single Plane A4C EF: 50.2 %

## 2023-05-16 NOTE — Telephone Encounter (Signed)
 Spoke with patient and reminded him we need clearance from ophthalmology before we can send his prescription to the pharmacy. Patient expressed understanding and states he will reach out to his ophthalmologist.

## 2023-05-20 ENCOUNTER — Ambulatory Visit: Payer: 59 | Admitting: Dermatology

## 2023-05-22 ENCOUNTER — Telehealth: Payer: Self-pay | Admitting: Rheumatology

## 2023-05-22 MED ORDER — XELJANZ 5 MG PO TABS
5.0000 mg | ORAL_TABLET | Freq: Every day | ORAL | 0 refills | Status: DC
  Filled 2023-05-26: qty 30, 30d supply, fill #0
  Filled 2023-05-29: qty 60, 60d supply, fill #0

## 2023-05-22 NOTE — Addendum Note (Signed)
 Addended by: Romayne Clubs on: 05/22/2023 01:29 PM   Modules accepted: Orders

## 2023-05-22 NOTE — Addendum Note (Signed)
 Addended by: Adrianne Horn on: 05/22/2023 01:24 PM   Modules accepted: Orders

## 2023-05-22 NOTE — Telephone Encounter (Signed)
 Received a call from Robin at [facility] in [place]. She advised [name] has been cleared by Dr. Lowry Rued from his cataract surgery and may restart his Xeljanz .

## 2023-05-22 NOTE — Telephone Encounter (Signed)
 Returned call to patient. Advised patient we received clearance from eye doctor today for him to restart the medication. Patient advised we will be sending prescription to the pharmacy. Patient expressed understanding.

## 2023-05-22 NOTE — Telephone Encounter (Signed)
 Patient left a voicemail stating he has questions regarding his medication and requested a return call.

## 2023-05-23 ENCOUNTER — Other Ambulatory Visit: Payer: Self-pay | Admitting: Pharmacist

## 2023-05-23 NOTE — Progress Notes (Signed)
 Patient restarting treatment with Xeljanz . Was holding due to surgery.  Continue Xeljanz  5mg  once daily  Repeat CBC/CMP in 1 month then every 3 months  Geraldene Kleine, PharmD, MPH, BCPS, CPP Clinical Pharmacist (Rheumatology and Pulmonology)

## 2023-05-26 ENCOUNTER — Other Ambulatory Visit (HOSPITAL_COMMUNITY): Payer: Self-pay

## 2023-05-26 NOTE — Progress Notes (Signed)
 Specialty Pharmacy Initial Fill Coordination Note  [name] is a 73 y.o. male contacted today regarding initial fill of specialty medication(s) Tofacitinib  Citrate (Xeljanz )   Patient requested Delivery   Delivery date: 05/28/23   Verified address: [address]   [place]  [id number]   Medication will be filled on 05/27/23.   Patient is aware of $0 copayment.

## 2023-05-27 ENCOUNTER — Telehealth: Payer: Self-pay | Admitting: Emergency Medicine

## 2023-05-27 ENCOUNTER — Other Ambulatory Visit (HOSPITAL_COMMUNITY): Payer: Self-pay

## 2023-05-27 NOTE — Telephone Encounter (Signed)
 Copied from CRM [id number]. Topic: General - Other >> May 26, 2023 10:59 AM Juliana Ocean wrote: Reason for CRM: pt states he received call from the office, but no one said anything.  I do not see where anyone has called unless it was pharmacy.  I called [facility] pharmacy, she sent message to pharmacists.  No one recalls calling him.     Called and left VM for pt. Patient was scheduled for LDCT on 6/13 at [facility]. However, the scan is no longer scheduled. Left on VM for pt to call back to reschedule annual LDCT.

## 2023-05-28 ENCOUNTER — Other Ambulatory Visit (HOSPITAL_COMMUNITY): Payer: Self-pay

## 2023-05-28 ENCOUNTER — Other Ambulatory Visit: Payer: Self-pay

## 2023-05-29 ENCOUNTER — Other Ambulatory Visit (HOSPITAL_COMMUNITY): Payer: Self-pay

## 2023-05-29 ENCOUNTER — Other Ambulatory Visit: Payer: Self-pay

## 2023-06-02 ENCOUNTER — Ambulatory Visit: Payer: Self-pay | Admitting: Nurse Practitioner

## 2023-06-02 NOTE — Progress Notes (Deleted)
 Referring Provider: Orlena Bitters, MD Primary Care Physician:  Orlena Bitters, MD Primary GI Physician: Dr. Jenney Modest chief complaint on file.   HPI:   [name] is a 73 y.o. male with history of severe peripheral vascular disease status post multiple interventions, CAD, heart palpitations, COPD, DVT, HTN, HLD, type 2 diabetes, rheumatoid arthritis, IDA previously, GERD, H. 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  Last seen in the office 12/16/2022.  GERD was controlled on Dexilant  60 mg daily.  He was using Trulance  about once a week which worked produce 1-2 diarrhea type stools, otherwise would have hard or incomplete bowel movements on a daily basis.  Did not want to take Trulance  daily due to concerns about diarrhea.  Reported Linzess  145 mcg was too strong, Linzess  72 mcg not strong enough, and preferred to continue his current regimen with Trulance .  Overall, abdominal pain improved.  Advised that he start Colace 100 mg daily and increase up to a total of 300 mg daily if needed.  To continue to use Trulance  as needed.  Today: GERD:   Constipation/IBS:     Past Medical History:  Diagnosis Date   Anemia    Bronchitis    Carotid artery disease (HCC)    Cervical spine disease    Neck surgery in 2007 with Dr. Rica Chalet   Collagen vascular disease [facility])    COPD (chronic obstructive pulmonary disease) (HCC)    Coronary atherosclerosis of native coronary artery    Nonobstrtuctive by catheterization 2007, 2013, 2021   DVT (deep venous thrombosis) (HCC)    Essential hypertension    GERD (gastroesophageal reflux disease)    Hyperlipidemia    PAD (peripheral artery disease) (HCC)    s/p left fem-pop bypass 2007;  PTA and stenting of right iliac artery 09/2011   Rheumatoid arthritis(714.0)    Type 2 diabetes mellitus (HCC)     Past Surgical History:  Procedure Laterality Date   ABDOMINAL AORTAGRAM N/A 10/03/2011   Procedure: ABDOMINAL Tommi Fraise;  Surgeon: Arvil Lauber, MD;  Location: [facility] CATH LAB;  Service: Cardiovascular;  Laterality: N/A;   ABDOMINAL AORTOGRAM W/LOWER EXTREMITY N/A 01/16/2017   Procedure: ABDOMINAL AORTOGRAM W/LOWER EXTREMITY;  Surgeon: Arvil Lauber, MD;  Location: [facility] INVASIVE CV LAB;  Service: Cardiovascular;  Laterality: N/A;   ABDOMINAL AORTOGRAM W/LOWER EXTREMITY Bilateral 08/04/2019   Procedure: ABDOMINAL AORTOGRAM W/LOWER EXTREMITY;  Surgeon: Young Hensen, MD;  Location: [facility] INVASIVE CV LAB;  Service: Cardiovascular;  Laterality: Bilateral;   ABDOMINAL AORTOGRAM W/LOWER EXTREMITY N/A 03/09/2020   Procedure: ABDOMINAL AORTOGRAM W/LOWER EXTREMITY;  Surgeon: Young Hensen, MD;  Location: [facility] INVASIVE CV LAB;  Service: Cardiovascular;  Laterality: N/A;   ABDOMINAL AORTOGRAM W/LOWER EXTREMITY N/A 12/02/2022   Procedure: ABDOMINAL AORTOGRAM W/LOWER EXTREMITY;  Surgeon: Philipp Brawn, MD;  Location: [facility] INVASIVE CV LAB;  Service: Cardiovascular;  Laterality: N/A;   BIOPSY N/A 12/28/2021   Procedure: BIOPSY;  Surgeon: Vinetta Greening, DO;  Location: [facility] ENDO SUITE;  Service: Endoscopy;  Laterality: N/A;   CATARACT EXTRACTION W/PHACO Right 04/18/2023   Procedure: PHACOEMULSIFICATION, CATARACT, WITH IOL INSERTION;  Surgeon: Ardeth Krabbe, MD;  Location: [facility] ORS;  Service: Ophthalmology;  Laterality: Right;  CDE: 1.81   CATARACT EXTRACTION W/PHACO Left  05/02/2023   Procedure: PHACOEMULSIFICATION, CATARACT, WITH IOL INSERTION;  Surgeon: Ardeth Krabbe, MD;  Location: [facility] ORS;  Service: Ophthalmology;  Laterality: Left;  CDE: 1.17   CERVICAL DISC SURGERY     COLONOSCOPY  2013   Hyperplastic polyp   COLONOSCOPY WITH PROPOFOL  N/A 02/17/2017   Surgeon: Suzette Espy, MD;  Many small and large mouth diverticula in the sigmoid, descending, transverse colon, internal hemorrhoids that were medium sized and grade 1.   COLONOSCOPY WITH PROPOFOL  N/A 12/28/2021   Surgeon: Vinetta Greening, DO;  Diverticulosis in the sigmoid colon and descending colon.  Likely benign tumor in the ascending colon.   ESOPHAGOGASTRODUODENOSCOPY (EGD) WITH PROPOFOL  N/A 12/28/2021   Procedure: ESOPHAGOGASTRODUODENOSCOPY (EGD) WITH PROPOFOL ;  Surgeon: Vinetta Greening, DO;  Location: [facility] END[facility] SUITE;  Service: Endoscopy;  Laterality: N/A;   FEMORAL-POPLITEAL BYPASS GRAFT  11/06/2005   ILIAC ARTERY STENT  10/03/2011   Right CIA stenting   INSERTION, STENT, DRUG-ELUTING, LACRIMAL CANALICULUS Right 04/18/2023   Procedure: INSERTION, STENT, DRUG-ELUTING, LACRIMAL CANALICULUS;  Surgeon: Ardeth Krabbe, MD;  Location: [facility] ORS;  Service: Ophthalmology;  Laterality: Right;   INSERTION, STENT, DRUG-ELUTING, LACRIMAL CANALICULUS Left 05/02/2023   Procedure: INSERTION, STENT, DRUG-ELUTING, LACRIMAL CANALICULUS;  Surgeon: Ardeth Krabbe, MD;  Location: [facility] ORS;  Service: Ophthalmology;  Laterality: Left;   LEFT HEART CATH AND CORONARY ANGIOGRAPHY N/A 04/14/2019   Procedure: LEFT HEART CATH AND CORONARY ANGIOGRAPHY;  Surgeon: Millicent Ally, MD;  Location: [facility] INVASIVE CV LAB;  Service: Cardiovascular;  Laterality: N/A;   PERIPHERAL VASCULAR BALLOON ANGIOPLASTY Right 01/16/2017   Procedure: PERIPHERAL VASCULAR BALLOON ANGIOPLASTY;  Surgeon: Arvil Lauber, MD;  Location: [facility] INVASIVE CV LAB;  Service: Cardiovascular;  Laterality: Right;  common iliac    PERIPHERAL VASCULAR BALLOON ANGIOPLASTY  03/09/2020   Procedure: PERIPHERAL VASCULAR BALLOON ANGIOPLASTY;  Surgeon: Young Hensen, MD;  Location: [facility] INVASIVE CV LAB;  Service: Cardiovascular;;   PR VEIN BYPASS GRAFT,AORTO-FEM-POP  06/22/2010   Redo Left Fem-Pop   Repair of left arm fracture     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 8 g 2   atorvastatin  (LIPITOR) 80 MG tablet TAKE 1 TABLET(80 MG) BY MOUTH DAILY (Patient taking differently: Take 80 mg by mouth daily.) 90 tablet 3   cilostazol  (PLETAL ) 100 MG tablet TAKE 1 TABLET(100 MG) BY MOUTH TWICE DAILY 180 tablet 3   ciprofloxacin (CILOXAN) 0.3 % ophthalmic solution Place 1 drop into the right eye 4 (four) times daily.     clopidogrel  (PLAVIX ) 75 MG tablet Take 1 tablet (75 mg total) by mouth daily. 30 tablet 11   dexlansoprazole  (DEXILANT ) 60 MG capsule Take 1 capsule (60 mg total) by mouth daily. 30 capsule 11   diclofenac  Sodium (VOLTAREN ) 1 % GEL Apply 2 gram  to painful foot and ankle joint twice daily. (Patient taking differently: Apply 2 g topically 2 (two) times daily as needed (pain (foot/ankle)). Apply 2 gram  to painful foot and ankle joint twice daily.) 150 g 1   empagliflozin (JARDIANCE) 25 MG TABS tablet Take 25 mg by mouth in the morning.     glipiZIDE (GLUCOTROL) 5 MG tablet Take 5-10 mg by mouth See admin instructions.  HYDROcodone-acetaminophen  (NORCO) 7.5-325 MG per tablet Take 1 tablet by mouth 2 (two) times daily as needed (pain.).     JANUVIA 100 MG tablet Take 100 mg by mouth in the morning.     ketoconazole  (NIZORAL ) 2 % cream Apply 1 Application topically daily as needed (skin irritation (feet/toes)).     ketorolac  (ACULAR ) 0.5 % ophthalmic solution Place 1 drop into the right eye 4 (four) times daily.     LANTUS SOLOSTAR 100 UNIT/ML Solostar Pen Inject 10 Units into the skin at bedtime.     metoprolol  succinate (TOPROL  XL) 25 MG 24 hr tablet Take 0.5 tablets (12.5 mg total) by mouth daily. (Patient taking differently: Take 25 mg by mouth daily.)     metoprolol  tartrate (LOPRESSOR ) 100 MG tablet Take 1 (100 Mg)  tablet 2 hours before your procedure 1 tablet 0   nitroGLYCERIN  (NITROSTAT ) 0.4 MG SL tablet Place 1 tablet (0.4 mg total) under the tongue every 5 (five) minutes x 3 doses as needed for chest pain (If no relief after 3rd dose, GO TO ER.). 25 tablet 3   Omega-3 Fatty Acids (OMEGA-3 FISH OIL PO) Take 1 capsule by mouth daily.     ONE TOUCH ULTRA TEST test strip      OneTouch Delica Lancets 33G MISC Apply topically.     Plecanatide  (TRULANCE ) 3 MG TABS Take 1 tablet (3 mg total) by mouth daily. (Patient taking differently: Take 3 mg by mouth daily as needed (constipation).) 30 tablet 5   prednisoLONE acetate (PRED FORTE) 1 % ophthalmic suspension INSTILL 1 DROP INTO RIGHT EYE FOUR TIMES DAILY FOR 1 WEEK THEN THREE TIMES DAILY FOR 1 WEEK THEN TWICE DAILY FOR 1 WEEK THEN EVERY DAY     predniSONE  (DELTASONE ) 5 MG tablet Take 4 tabs po x 4 days, 3  tabs po x 4 days, 2  tabs po x 4 days, 1  tab po x 4 days 40 tablet 0   sildenafil (REVATIO) 20 MG tablet Take 40-60 mg by mouth daily as needed for erectile dysfunction.     SPIRIVA  RESPIMAT 2.5 MCG/ACT AERS INHALE 2 PUFFS INTO THE LUNGS DAILY 4 g 11   SYMBICORT  160-4.5 MCG/ACT inhaler INHALE 2 PUFFS FIRST THING IN THE MORNING AND THEN 2 PUFFS 12 HOURS LATER 10.2 g 5   XELJANZ  5 MG TABS Take 1 tablet (5 mg total) by mouth daily. 90 tablet 0   No current facility-administered medications for this visit.    Allergies as of 06/04/2023 - Review Complete 05/02/2023  Allergen Reaction Noted   Penicillins Hives 03/04/2023    Family History  Problem Relation Age of Onset   Heart attack Mother 84   Deep vein thrombosis Mother    Diabetes Mother    Hyperlipidemia Mother    Hypertension Mother    Peripheral vascular disease Mother    Stroke Father    Diabetes Father    Hyperlipidemia Father    Hypertension Father    Peripheral vascular disease Father    Diabetes Sister    Hyperlipidemia Sister    Hypertension Sister    Diabetes Brother     Hyperlipidemia Brother    Cancer Brother    Deep vein thrombosis Brother    Hypertension Brother    Peripheral vascular disease Brother    Diabetes Daughter    Hyperlipidemia Son    Cancer Son    Hypertension Son    Colon cancer Neg Hx     Social History  Socioeconomic History   Marital status: Single    Spouse name: Not on file   Number of children: Not on file   Years of education: Not on file   Highest education level: Not on file  Occupational History   Not on file  Tobacco Use   Smoking status: Former    Current packs/day: 0.00    Average packs/day: 1.5 packs/day for 45.0 years (67.5 ttl pk-yrs)    Types: Cigarettes, Cigars    Start date: 09/01/1966    Quit date: 09/01/2011    Years since quitting: 11.7    Passive exposure: Never   Smokeless tobacco: Never  Vaping Use   Vaping status: Never Used  Substance and Sexual Activity   Alcohol use: Not Currently   Drug use: No   Sexual activity: Not Currently    Birth control/protection: None  Other Topics Concern   Not on file  Social History Narrative   The patient lives in [place].    Social Drivers of Corporate investment banker Strain: Not on file  Food Insecurity: Not on file  Transportation Needs: Not on file  Physical Activity: Not on file  Stress: Not on file  Social Connections: Not on file    Review of Systems: Gen: Denies fever, chills, anorexia. Denies fatigue, weakness, weight loss.  CV: Denies chest pain, palpitations, syncope, peripheral edema, and claudication. Resp: Denies dyspnea at rest, cough, wheezing, coughing up blood, and pleurisy. GI: Denies vomiting blood, jaundice, and fecal incontinence.   Denies dysphagia or odynophagia. Derm: Denies rash, itching, dry skin Psych: Denies depression, anxiety, memory loss, confusion. No homicidal or suicidal ideation.  Heme: Denies bruising, bleeding, and enlarged lymph nodes.  Physical Exam: There were no vitals taken for this visit. General:    Alert and oriented. No distress noted. Pleasant and cooperative.  Head:  Normocephalic and atraumatic. Eyes:  Conjuctiva clear without scleral icterus. Heart:  S1, S2 present without murmurs appreciated. Lungs:  Clear to auscultation bilaterally. No wheezes, rales, or rhonchi. No distress.  Abdomen:  +BS, soft, non-tender and non-distended. No rebound or guarding. No HSM or masses noted. Msk:  Symmetrical without gross deformities. Normal posture. Extremities:  Without edema. Neurologic:  Alert and  oriented x4 Psych:  Normal mood and affect.    Assessment:     Plan:  ***   Shana Daring, PA-C [facility] 06/04/2023

## 2023-06-04 ENCOUNTER — Ambulatory Visit: Admitting: Gastroenterology

## 2023-06-06 NOTE — Telephone Encounter (Signed)
 LVM to schedule annual Lung CT.

## 2023-06-09 ENCOUNTER — Other Ambulatory Visit (HOSPITAL_COMMUNITY): Payer: Self-pay

## 2023-06-09 ENCOUNTER — Telehealth: Payer: Self-pay | Admitting: Cardiology

## 2023-06-09 NOTE — Telephone Encounter (Signed)
 Heart pumping function has returned to normal. Aortic valve is mildly leaky. Overall looks good. I am pleased with this report.   Lasalle Pointer, AGNP-C   Patient informed and verbalized understanding of plan.

## 2023-06-09 NOTE — Telephone Encounter (Signed)
 Pt came into [place] office asking about his echo results and would like someone to talk to him about what is going on and if someone can print out results for him

## 2023-06-11 ENCOUNTER — Encounter: Payer: Self-pay | Admitting: Podiatry

## 2023-06-11 ENCOUNTER — Ambulatory Visit (INDEPENDENT_AMBULATORY_CARE_PROVIDER_SITE_OTHER): Payer: 59 | Admitting: Podiatry

## 2023-06-11 DIAGNOSIS — M79674 Pain in right toe(s): Secondary | ICD-10-CM | POA: Diagnosis not present

## 2023-06-11 DIAGNOSIS — M2012 Hallux valgus (acquired), left foot: Secondary | ICD-10-CM

## 2023-06-11 DIAGNOSIS — B353 Tinea pedis: Secondary | ICD-10-CM | POA: Diagnosis not present

## 2023-06-11 DIAGNOSIS — E1159 Type 2 diabetes mellitus with other circulatory complications: Secondary | ICD-10-CM | POA: Diagnosis not present

## 2023-06-11 DIAGNOSIS — M79675 Pain in left toe(s): Secondary | ICD-10-CM

## 2023-06-11 DIAGNOSIS — B351 Tinea unguium: Secondary | ICD-10-CM

## 2023-06-11 DIAGNOSIS — M2041 Other hammer toe(s) (acquired), right foot: Secondary | ICD-10-CM

## 2023-06-11 DIAGNOSIS — M2011 Hallux valgus (acquired), right foot: Secondary | ICD-10-CM

## 2023-06-11 DIAGNOSIS — E119 Type 2 diabetes mellitus without complications: Secondary | ICD-10-CM

## 2023-06-11 DIAGNOSIS — M2042 Other hammer toe(s) (acquired), left foot: Secondary | ICD-10-CM

## 2023-06-11 MED ORDER — KETOCONAZOLE 2 % EX CREA: TOPICAL_CREAM | CUTANEOUS | 1 refills | Status: DC

## 2023-06-11 NOTE — Patient Instructions (Signed)
 To prevent reinfection, spray shoes with lysol every evening.  Clean tub or shower with bleach based cleanser.  Athlete's Foot Athlete's foot (tinea pedis) is a fungal infection of the skin on your feet. It often occurs on the skin that is between or underneath the toes. It can also occur on the soles of your feet. The infection can spread from person to person (is contagious). It can also spread when a person's bare feet come in contact with the fungus on shower floors or on items such as shoes. What are the causes? This condition is caused by a fungus that grows in warm, moist places. You can get athlete's foot by sharing shoes, shower stalls, towels, and wet floors with someone who is infected. Not washing your feet or changing your socks often enough can also lead to athlete's foot. What increases the risk? This condition is more likely to develop in: Men. People who have a weak body defense system (immune system). People who have diabetes. People who use public showers, such as at a gym. People who wear heavy-duty shoes, such as industrial or military shoes. Seasons with warm, humid weather. What are the signs or symptoms? Symptoms of this condition include: Itchy areas between your toes or on the soles of your feet. White, flaky, or scaly areas between your toes or on the soles of your feet. Very itchy small blisters between your toes or on the soles of your feet. Small cuts in your skin. These cuts can become infected. Thick or discolored toenails. How is this diagnosed? This condition may be diagnosed with a physical exam and a review of your medical history. Your health care provider may also take a skin or toenail sample to examine under a microscope. How is this treated? This condition is treated with antifungal medicines. These may be applied as powders, ointments, or creams. In severe cases, an oral antifungal medicine may be given. Follow these instructions at  home: Medicines Apply or take over-the-counter and prescription medicines only as told by your health care provider. Apply your antifungal medicine as told by your health care provider. Do not stop using the antifungal even if your condition improves. Foot care Do not scratch your feet. Keep your feet dry: Wear cotton or wool socks. Change your socks every day or if they become wet. Wear shoes that allow air to flow, such as sandals or canvas tennis shoes. Wash and dry your feet, including the area between your toes. Also, wash and dry your feet: Every day or as told by your health care provider. After exercising. General instructions Do not let others use towels, shoes, nail clippers, or other personal items that touch your feet. Protect your feet by wearing sandals in wet areas, such as locker rooms and shared showers. Keep all follow-up visits. This is important. If you have diabetes, keep your blood sugar under control. Contact a health care provider if: You have a fever. You have swelling, soreness, warmth, or redness in your foot. Your feet are not getting better with treatment. Your symptoms get worse. You have new symptoms. You have severe pain. Summary Athlete's foot (tinea pedis) is a fungal infection of the skin on your feet. It often occurs on skin that is between or underneath the toes. This condition is caused by a fungus that grows in warm, moist places. Symptoms include white, flaky, or scaly areas between your toes or on the soles of your feet. This condition is treated with antifungal medicines.   Keep your feet clean. Always dry them thoroughly. This information is not intended to replace advice given to you by your health care provider. Make sure you discuss any questions you have with your health care provider. Document Revised: 04/16/2020 Document Reviewed: 04/16/2020 Elsevier Patient Education  2024 [employer]  

## 2023-06-15 ENCOUNTER — Encounter: Payer: Self-pay | Admitting: Podiatry

## 2023-06-15 NOTE — Progress Notes (Signed)
 ANNUAL DIABETIC FOOT EXAM  Subjective: [name] presents today for annual diabetic foot exam.  Chief Complaint  Patient presents with   [facility]    Rm17 Dfc/ A1C 8/blood sugar 212/ last visit pcp Jun 09 2023   Patient confirms h/o diabetes.  Patient denies any h/o foot wounds.  [name] has been diagnosed with PAD.  Orlena Bitters, MD is patient's PCP.  Past Medical History:  Diagnosis Date   Anemia    Bronchitis    Carotid artery disease (HCC)    Cervical spine disease    Neck surgery in 2007 with Dr. Rica Chalet   Collagen vascular disease [facility])    COPD (chronic obstructive pulmonary disease) (HCC)    Coronary atherosclerosis of native coronary artery    Nonobstrtuctive by catheterization 2007, 2013, 2021   DVT (deep venous thrombosis) (HCC)    Essential hypertension    GERD (gastroesophageal reflux disease)    Hyperlipidemia    PAD (peripheral artery disease) (HCC)    s/p left fem-pop bypass 2007; PTA and stenting of right iliac artery 09/2011   Rheumatoid arthritis(714.0)    Type 2 diabetes mellitus (HCC)    Patient Active Problem List   Diagnosis Date Noted   Constipation 09/16/2022   LLQ abdominal pain 09/16/2022   Colon tumor 09/16/2022   Former cigarette smoker 04/15/2022   Dysphagia 11/05/2021   Iron deficiency anemia 11/05/2021   Symptomatic anemia 10/30/2021   Atheroscler nonbiologic bypass graft left leg w/intermit claudication (HCC) 05/15/2021   Peripheral polyneuropathy 08/15/2020   Ulnar neuropathy of right upper extremity 08/15/2020   Foraminal stenosis of cervical region 07/20/2020   Tremor 02/15/2019   Cervical radiculitis 10/15/2018   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   Opioid dependence (HCC) 10/15/2018   Other long term (current) drug therapy 10/15/2018   Diabetes mellitus type 2, controlled, without complications (HCC) 10/15/2018   Puncture wound of finger of left hand 08/08/2018   Puncture wound of finger, right, complicated, initial  encounter 08/05/2018   DOE (dyspnea on exertion) 11/04/2017   Muscle cramps 07/04/2017   Contracture of left elbow 07/04/2017   High risk medication use 06/23/2017   Primary osteoarthritis of both hands 06/23/2017   Primary osteoarthritis of both feet 06/23/2017   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 factor (HCC) 06/23/2017   DDD (degenerative disc disease), cervical 06/23/2017   Rotator cuff syndrome of right shoulder 06/06/2017   History of  bilateral carpal tunnel release 06/06/2017   Ganglion cyst of wrist, left 06/06/2017   Non-insulin dependent type 2 diabetes mellitus (HCC) 06/06/2017   Chest pain with high risk for cardiac etiology 04/05/2017   COPD with acute exacerbation (HCC) 03/13/2017   Plantar fasciitis 02/21/2017   Iliac artery stenosis, right (HCC) 02/12/2017   Black stool 02/10/2017   Abnormal CT of the abdomen 02/10/2017   Cervical spondylosis 10/11/2016   Bony growth 10/13/2015   Pain in joint, lower leg 03/02/2014   Discoloration of skin-Left dorsum foot 09/28/2013   Swelling of limb-Left Calf / Leg 09/28/2013   PVD (peripheral vascular disease) (HCC) 10/14/2012   Pain in limb- Left popliteal and calf 10/14/2012   Dyslipidemia, goal LDL below 70    Essential hypertension 03/05/2012   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   Generalized osteoarthritis of multiple sites 02/06/2011   History of arterial bypass of lower limb 12/25/2010   Atherosclerosis of native arteries of extremity with intermittent claudication (HCC) 12/25/2010   Coronary artery calcification seen on CT  scan 04/16/2008   COPD  GOLD II 04/16/2008   Gastroesophageal reflux disease 04/16/2008   Rheumatoid arthritis (HCC) 04/16/2008   DIABETES MELLITUS, BORDERLINE 04/16/2008   Past Surgical History:  Procedure Laterality Date   ABDOMINAL AORTAGRAM N/A 10/03/2011   Procedure: ABDOMINAL Tommi Fraise;  Surgeon: Arvil Lauber, MD;  Location: [facility] CATH LAB;  Service:  Cardiovascular;  Laterality: N/A;   ABDOMINAL AORTOGRAM W/LOWER EXTREMITY N/A 01/16/2017   Procedure: ABDOMINAL AORTOGRAM W/LOWER EXTREMITY;  Surgeon: Arvil Lauber, MD;  Location: [facility] INVASIVE CV LAB;  Service: Cardiovascular;  Laterality: N/A;   ABDOMINAL AORTOGRAM W/LOWER EXTREMITY Bilateral 08/04/2019   Procedure: ABDOMINAL AORTOGRAM W/LOWER EXTREMITY;  Surgeon: Young Hensen, MD;  Location: [facility] INVASIVE CV LAB;  Service: Cardiovascular;  Laterality: Bilateral;   ABDOMINAL AORTOGRAM W/LOWER EXTREMITY N/A 03/09/2020   Procedure: ABDOMINAL AORTOGRAM W/LOWER EXTREMITY;  Surgeon: Young Hensen, MD;  Location: [facility] INVASIVE CV LAB;  Service: Cardiovascular;  Laterality: N/A;   ABDOMINAL AORTOGRAM W/LOWER EXTREMITY N/A 12/02/2022   Procedure: ABDOMINAL AORTOGRAM W/LOWER EXTREMITY;  Surgeon: Philipp Brawn, MD;  Location: [facility] INVASIVE CV LAB;  Service: Cardiovascular;  Laterality: N/A;   BIOPSY N/A 12/28/2021   Procedure: BIOPSY;  Surgeon: Vinetta Greening, DO;  Location: [facility] ENDO SUITE;  Service: Endoscopy;  Laterality: N/A;   CATARACT EXTRACTION W/PHACO Right 04/18/2023   Procedure: PHACOEMULSIFICATION, CATARACT, WITH IOL INSERTION;  Surgeon: Ardeth Krabbe, MD;  Location: [facility] ORS;  Service: Ophthalmology;  Laterality: Right;  CDE: 1.81   CATARACT EXTRACTION W/PHACO Left 05/02/2023   Procedure: PHACOEMULSIFICATION, CATARACT, WITH IOL INSERTION;  Surgeon: Ardeth Krabbe, MD;  Location: [facility] ORS;  Service: Ophthalmology;  Laterality: Left;  CDE: 1.17   CERVICAL DISC SURGERY     COLONOSCOPY  2013   Hyperplastic polyp   COLONOSCOPY WITH PROPOFOL  N/A 02/17/2017   Surgeon: Suzette Espy, MD; Many small and large mouth diverticula in the sigmoid, descending, transverse colon, internal hemorrhoids that were medium sized and grade 1.   COLONOSCOPY WITH PROPOFOL  N/A 12/28/2021   Surgeon: Vinetta Greening, DO;  Diverticulosis in the sigmoid colon and descending colon.  Likely benign tumor in  the ascending colon.   ESOPHAGOGASTRODUODENOSCOPY (EGD) WITH PROPOFOL  N/A 12/28/2021   Procedure: ESOPHAGOGASTRODUODENOSCOPY (EGD) WITH PROPOFOL ;  Surgeon: Vinetta Greening, DO;  Location: [facility] END[facility] SU[facility];  Service: Endoscopy;  Laterality: N/A;   FEMORAL-POPLITEAL BYPASS GRAFT  11/06/2005   ILIAC ARTERY STENT  10/03/2011   Right CIA stenting   INSERTION, STENT, DRUG-ELUTING, LACRIMAL CANALICULUS Right 04/18/2023   Procedure: INSERTION, STENT, DRUG-ELUTING, LACRIMAL CANALICULUS;  Surgeon: Ardeth Krabbe, MD;  Location: [facility] ORS;  Service: Ophthalmology;  Laterality: Right;   INSERTION, STENT, DRUG-ELUTING, LACRIMAL CANALICULUS Left 05/02/2023   Procedure: INSERTION, STENT, DRUG-ELUTING, LACRIMAL CANALICULUS;  Surgeon: Ardeth Krabbe, MD;  Location: [facility] ORS;  Service: Ophthalmology;  Laterality: Left;   LEFT HEART CATH AND CORONARY ANGIOGRAPHY N/A 04/14/2019   Procedure: LEFT HEART CATH AND CORONARY ANGIOGRAPHY;  Surgeon: Millicent Ally, MD;  Location: [facility] INVASIVE CV LAB;  Service: Cardiovascular;  Laterality: N/A;   PERIPHERAL VASCULAR BALLOON ANGIOPLASTY Right 01/16/2017   Procedure: PERIPHERAL VASCULAR BALLOON ANGIOPLASTY;  Surgeon: Arvil Lauber, MD;  Location: [facility] INVASIVE CV LAB;  Service: Cardiovascular;  Laterality: Right;  common iliac    PERIPHERAL VASCULAR BALLOON ANGIOPLASTY  03/09/2020   Procedure: PERIPHERAL VASCULAR BALLOON ANGIOPLASTY;  Surgeon: Young Hensen, MD;  Location: [facility] INVASIVE CV LAB;  Service: Cardiovascular;;   PR VEIN BYPASS GRAFT,AORTO-FEM-POP  06/22/2010  Redo Left Fem-Pop   Repair of left arm fracture     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 8 g 2   atorvastatin  (LIPITOR) 80 MG tablet TAKE 1  TABLET(80 MG) BY MOUTH DAILY (Patient taking differently: Take 80 mg by mouth daily.) 90 tablet 3   cilostazol  (PLETAL ) 100 MG tablet TAKE 1 TABLET(100 MG) BY MOUTH TWICE DAILY 180 tablet 3   ciprofloxacin (CILOXAN) 0.3 % ophthalmic solution Place 1 drop into the right eye 4 (four) times daily.     clopidogrel  (PLAVIX ) 75 MG tablet Take 1 tablet (75 mg total) by mouth daily. 30 tablet 11   diclofenac  Sodium (VOLTAREN ) 1 % GEL Apply 2 gram  to painful foot and ankle joint twice daily. (Patient taking differently: Apply 2 g topically 2 (two) times daily as needed (pain (foot/ankle)). Apply 2 gram  to painful foot and ankle joint twice daily.) 150 g 1   empagliflozin (JARDIANCE) 25 MG TABS tablet Take 25 mg by mouth in the morning.     glipiZIDE (GLUCOTROL) 5 MG tablet Take 5-10 mg by mouth See admin instructions.     HYDROcodone-acetaminophen  (NORCO) 7.5-325 MG per tablet Take 1 tablet by mouth 2 (two) times daily as needed (pain.).     JANUVIA 100 MG tablet Take 100 mg by mouth in the morning.     ketorolac  (ACULAR ) 0.5 % ophthalmic solution Place 1 drop into the right eye 4 (four) times daily.     LANTUS SOLOSTAR 100 UNIT/ML Solostar Pen Inject 10 Units into the skin at bedtime.     metoprolol  succinate (TOPROL  XL) 25 MG 24 hr tablet Take 0.5 tablets (12.5 mg total) by mouth daily. (Patient taking differently: Take 25 mg by mouth daily.)     metoprolol  tartrate (LOPRESSOR ) 100 MG tablet Take 1 (100 Mg) tablet 2 hours before your procedure 1 tablet 0   nitroGLYCERIN  (NITROSTAT ) 0.4 MG SL tablet Place 1 tablet (0.4 mg total) under the tongue every 5 (five) minutes x 3 doses as needed for chest pain (If no relief after 3rd dose, GO TO ER.). 25 tablet 3   Omega-3 Fatty Acids (OMEGA-3 FISH OIL PO) Take 1 capsule by mouth daily.     ONE TOUCH ULTRA TEST test strip      OneTouch Delica Lancets 33G MISC Apply topically.     Plecanatide  (TRULANCE ) 3 MG TABS Take 1 tablet (3 mg total) by mouth daily. (Patient  taking differently: Take 3 mg by mouth daily as needed (constipation).) 30 tablet 5   prednisoLONE acetate (PRED FORTE) 1 % ophthalmic suspension INSTILL 1 DROP INTO RIGHT EYE FOUR TIMES DAILY FOR 1 WEEK THEN THREE TIMES DAILY FOR 1 WEEK THEN TWICE DAILY FOR 1 WEEK THEN EVERY DAY     predniSONE  (DELTASONE ) 5 MG tablet Take 4 tabs po x 4 days, 3  tabs po x 4 days, 2  tabs po x 4 days, 1  tab po x 4 days 40 tablet 0   sildenafil (REVATIO) 20 MG tablet Take 40-60 mg by mouth daily as needed for erectile dysfunction.     SPIRIVA  RESPIMAT 2.5 MCG/ACT AERS INHALE 2 PUFFS INTO THE LUNGS DAILY 4 g 11  SYMBICORT  160-4.5 MCG/ACT inhaler INHALE 2 PUFFS FIRST THING IN THE MORNING AND THEN 2 PUFFS 12 HOURS LATER 10.2 g 5   XELJANZ  5 MG TABS Take 1 tablet (5 mg total) by mouth daily. 90 tablet 0   dexlansoprazole  (DEXILANT ) 60 MG capsule Take 1 capsule (60 mg total) by mouth daily. 30 capsule 11   No current facility-administered medications on file prior to visit.    Allergies  Allergen Reactions   Penicillins Hives   Social History   Occupational History   Not on file  Tobacco Use   Smoking status: Former    Current packs/day: 0.00    Average packs/day: 1.5 packs/day for 45.0 years (67.5 ttl pk-yrs)    Types: Cigarettes, Cigars    Start date: 09/01/1966    Quit date: 09/01/2011    Years since quitting: 11.7    Passive exposure: Never   Smokeless tobacco: Never  Vaping Use   Vaping status: Never Used  Substance and Sexual Activity   Alcohol use: Not Currently   Drug use: No   Sexual activity: Not Currently    Birth control/protection: None   Family History  Problem Relation Age of Onset   Heart attack Mother 66   Deep vein thrombosis Mother    Diabetes Mother    Hyperlipidemia Mother    Hypertension Mother    Peripheral vascular disease Mother    Stroke Father    Diabetes Father    Hyperlipidemia Father    Hypertension Father    Peripheral vascular disease Father    Diabetes  Sister    Hyperlipidemia Sister    Hypertension Sister    Diabetes Brother    Hyperlipidemia Brother    Cancer Brother    Deep vein thrombosis Brother    Hypertension Brother    Peripheral vascular disease Brother    Diabetes Daughter    Hyperlipidemia Son    Cancer Son    Hypertension Son    Colon cancer Neg Hx    Immunization History  Administered Date(s) Administered   Influenza, High Dose Seasonal PF 10/14/2016, 09/21/2018   Influenza-Unspecified 11/08/2008, 12/10/2019   Moderna Sars-Covid-2 Vaccination 02/19/2019, 03/22/2019, 11/23/2019   PPD Test 09/22/2007   Td (Adult),5 Lf Tetanus Toxid, Preservative Free 02/09/2001   Tdap 02/09/2001, 08/05/2018     Review of Systems: Negative except as noted in the HPI.   Objective: There were no vitals filed for this visit.  Philbert Ocallaghan is a pleasant 73 y.o. male in NAD. AAO X 3.  Diabetic foot exam was performed with the following findings:   Vascular Examination: CFT <4 seconds b/l. DP pulses diminished b/l. PT pulses diminished b/l. Digital hair absent. Skin temperature gradient warm to cool b/l. No ischemia or gangrene. No cyanosis or clubbing noted b/l. No edema noted b/l LE.   Neurological Examination: Sensation grossly intact b/l with 10 gram monofilament.   Dermatological Examination: Pedal skin thin, shiny and atrophic b/l. No open wounds. No interdigital macerations.   Diffuse scaling noted peripherally and plantarly b/l feet.  No interdigital macerations.  No blisters, no weeping. No signs of secondary bacterial infection noted.  Toenails 1-5 b/l thick, discolored, elongated with subungual debris and pain on dorsal palpation.   No hyperkeratotic nor porokeratotic lesions present on today's visit.  Musculoskeletal Examination: Muscle strength 5/5 to all lower extremity muscle groups bilaterally. HAV with bunion deformity noted b/l LE. Hammertoe deformity noted 2-5 b/l.  Radiographs: None    ADA Risk  Categorization: High  Risk  Patient has one or more of the following: Loss of protective sensation Absent pedal pulses Severe Foot deformity History of foot ulcer  Assessment: 1. Pain due to onychomycosis of toenails of both feet   2. Tinea pedis of both feet   3. Hallux valgus, acquired, bilateral   4. Acquired hammertoes of both feet   5. Type 2 diabetes mellitus with vascular disease (HCC)   6. Encounter for diabetic foot exam (HCC)     Plan: Meds ordered this encounter  Medications   ketoconazole  (NIZORAL ) 2 % cream    Sig: Apply to both feet and between toes once daily for 6 weeks.    Dispense:  60 g    Refill:  1  -Patient was evaluated today. All questions/concerns addressed on today's visit. -Diabetic foot examination performed today. -Continue diabetic foot care principles: inspect feet daily, monitor glucose as recommended by PCP and/or Endocrinologist, and follow prescribed diet per PCP, Endocrinologist and/or dietician. -Patient to continue soft, supportive shoe gear daily. -Discussed tinea pedis infection. To prevent re-infection of tinea pedis, patient/POA/caregiver instructed to spray shoes with Lysol every evening and clean tub/shower with bleach based cleanser. Rx sent to pharmacy for Ketoconazole  Cream 2% to be applied to both feet and between toes once daily for six week.  -Patient/POA to call should there be question/concern in the interim. Return in about 3 months (around 09/11/2023).  Luella Sager, DPM      [facility] LOCATION: [address], [place] [address]                   Office [phone]   [facility] LOCATION: [address] [place], [place] [address] Office [phone]

## 2023-06-16 ENCOUNTER — Other Ambulatory Visit: Payer: Self-pay | Admitting: Cardiology

## 2023-06-16 ENCOUNTER — Inpatient Hospital Stay: Payer: 59 | Admitting: Gastroenterology

## 2023-06-30 ENCOUNTER — Other Ambulatory Visit (HOSPITAL_COMMUNITY)

## 2023-06-30 ENCOUNTER — Ambulatory Visit

## 2023-06-30 ENCOUNTER — Encounter (HOSPITAL_COMMUNITY)

## 2023-07-01 NOTE — Progress Notes (Unsigned)
 Office Visit Note  Patient: [name]             Date of Birth: 28-May-1950           MRN: [id number]             PCP: [name], MD Referring: [name], MD Visit Date: 07/03/2023 Occupation: @GUAROCC @  Subjective:  Right great toe pain   History of Present Illness: [name] is a 73 y.o. male with a past medical history of rheumatoid arthritis. Patient remains on xeljanz  5 mg 1 tablet by mouth daily.  He resumed Xeljanz  on 05/23/2023.  He has been tolerating Xeljanz  without any side effects.  Patient states that during the gap in therapy he was having increased pain and inflammation involving multiple joints.  Dr. Dolphus prescribed the patient a prednisone  taper on 04/18/2023 which abided significant relief.  Patient states that while taking prednisone  his blood glucose was significantly elevated.  He has noticed intermittent flares in the right shoulder, left wrist, and left hip joint.  Patient states that on 06/19/2023 he was mowing his lawn and hit his right great toe on something in the yard.  Patient states that he noted significant pain and swelling of the right great toe to the point that he had difficulty walking.  He was evaluated on 06/20/2023 at which time he had updated x-rays of the right foot and toe.  He denies any fracture but states that he has severe arthritis and was advised to follow back up with his rheumatologist for further evaluation.    Activities of Daily Living:  Patient reports morning stiffness for 30-60 minutes.   Patient Reports nocturnal pain.  Difficulty dressing/grooming: Reports Difficulty climbing stairs: Reports Difficulty getting out of chair: Reports Difficulty using hands for taps, buttons, cutlery, and/or writing: Reports  Review of Systems  Constitutional:  Positive for fatigue.  HENT:  Positive for mouth dryness. Negative for mouth sores.   Eyes:  Positive for dryness.  Respiratory:  Positive for shortness of breath.    Cardiovascular:  Negative for chest pain and palpitations.  Gastrointestinal:  Positive for constipation. Negative for blood in stool and diarrhea.  Endocrine: Negative for increased urination.  Genitourinary:  Negative for involuntary urination.  Musculoskeletal:  Positive for joint pain, gait problem, joint pain, joint swelling, myalgias, muscle weakness, morning stiffness, muscle tenderness and myalgias.  Skin:  Negative for color change, rash, hair loss and sensitivity to sunlight.  Allergic/Immunologic: Negative for susceptible to infections.  Neurological:  Positive for dizziness and headaches.  Hematological:  Negative for swollen glands.  Psychiatric/Behavioral:  Positive for sleep disturbance. Negative for depressed mood. The patient is not nervous/anxious.     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   Opioid dependence (HCC) 10/15/2018   Other long term (current) drug therapy 10/15/2018   Diabetes mellitus type 2, controlled, without complications (HCC) 10/15/2018   Puncture wound of finger of left hand 08/08/2018  Puncture wound of finger, right, complicated, initial encounter 08/05/2018   DOE (dyspnea on exertion) 11/04/2017   Muscle cramps 07/04/2017   Contracture of left elbow 07/04/2017   High risk medication use 06/23/2017   Primary osteoarthritis of both hands 06/23/2017   Primary osteoarthritis of  both feet 06/23/2017   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 factor (HCC) 06/23/2017   DDD (degenerative disc disease), cervical 06/23/2017   Rotator cuff syndrome of right shoulder 06/06/2017   History of  bilateral carpal tunnel release 06/06/2017   Ganglion cyst of wrist, left 06/06/2017   Non-insulin dependent type 2 diabetes mellitus (HCC) 06/06/2017   Chest pain with high risk for cardiac etiology 04/05/2017   COPD with acute exacerbation (HCC) 03/13/2017   Plantar fasciitis 02/21/2017   Iliac artery stenosis, right (HCC) 02/12/2017   Black stool 02/10/2017   Abnormal CT of the abdomen 02/10/2017   Cervical spondylosis 10/11/2016   Bony growth 10/13/2015   Pain in joint, lower leg 03/02/2014   Discoloration of skin-Left dorsum foot 09/28/2013   Swelling of limb-Left Calf / Leg 09/28/2013   PVD (peripheral vascular disease) (HCC) 10/14/2012   Pain in limb- Left popliteal and calf 10/14/2012   Dyslipidemia, goal LDL below 70    Essential hypertension 03/05/2012   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 Leeann   Collagen vascular disease [facility])    COPD (chronic obstructive pulmonary disease) (HCC)    Coronary atherosclerosis of native coronary artery    Nonobstrtuctive by catheterization 2007, 2013, 2021   DVT (deep venous thrombosis) (HCC)    Essential hypertension    GERD  (gastroesophageal reflux disease)    Hyperlipidemia    PAD (peripheral artery disease) (HCC)    s/p left fem-pop bypass 2007; PTA and stenting of right iliac artery 09/2011   Rheumatoid arthritis(714.0)    Type 2 diabetes mellitus (HCC)     Family History  Problem Relation Age of Onset   Heart attack Mother 29   Deep vein thrombosis Mother    Diabetes Mother    Hyperlipidemia Mother    Hypertension Mother    Peripheral vascular disease Mother    Stroke Father    Diabetes Father    Hyperlipidemia Father    Hypertension Father    Peripheral vascular disease Father    Diabetes Sister    Hyperlipidemia Sister    Hypertension Sister    Diabetes Brother    Hyperlipidemia Brother    Cancer Brother    Deep vein thrombosis Brother    Hypertension Brother    Peripheral vascular disease Brother    Diabetes Daughter    Hyperlipidemia Son    Cancer Son    Hypertension Son    Colon cancer Neg Hx    Past Surgical History:  Procedure Laterality Date   ABDOMINAL AORTAGRAM N/A 10/03/2011   Procedure: ABDOMINAL EZELLA;  Surgeon: Redell LITTIE Door,  MD;  Location: [facility] CATH LAB;  Service: Cardiovascular;  Laterality: N/A;   ABDOMINAL AORTOGRAM W/LOWER EXTREMITY N/A 01/16/2017   Procedure: ABDOMINAL AORTOGRAM W/LOWER EXTREMITY;  Surgeon: Laurence Redell CROME, MD;  Location: [facility] INVASIVE CV LAB;  Service: Cardiovascular;  Laterality: N/A;   ABDOMINAL AORTOGRAM W/LOWER EXTREMITY Bilateral 08/04/2019   Procedure: ABDOMINAL AORTOGRAM W/LOWER EXTREMITY;  Surgeon: Gretta Lonni PARAS, MD;  Location: [facility] INVASIVE CV LAB;  Service: Cardiovascular;  Laterality: Bilateral;   ABDOMINAL AORTOGRAM W/LOWER EXTREMITY N/A 03/09/2020   Procedure: ABDOMINAL AORTOGRAM W/LOWER EXTREMITY;  Surgeon: Gretta Lonni PARAS, MD;  Location: [facility] INVASIVE CV LAB;  Service: Cardiovascular;  Laterality: N/A;   ABDOMINAL AORTOGRAM W/LOWER EXTREMITY N/A 12/02/2022   Procedure: ABDOMINAL AORTOGRAM W/LOWER EXTREMITY;  Surgeon: Pearline Norman RAMAN, MD;  Location[facility] INVASIVE CV LAB;  Service: Cardiovascular;  Laterality: N/A;   BIOPSY N/A 12/28/2021   Procedure: BIOPSY;  Surgeon: Cindie Carlin POUR, DO;  Location: [facility] ENDO SUITE;  Service: Endoscopy;  Laterality: N/A;   CATARACT EXTRACTION W/PHACO Right 04/18/2023   Procedure: PHACOEMULSIFICATION, CATARACT, WITH IOL INSERTION;  Surgeon: Juli Blunt, MD;  Location: [facility] ORS;  Service: Ophthalmology;  Laterality: Right;  CDE: 1.81   CATARACT EXTRACTION W/PHACO Left 05/02/2023   Procedure: PHACOEMULSIFICATION, CATARACT, WITH IOL INSERTION;  Surgeon: Juli Blunt, MD;  Location: [facility] ORS;  Service: Ophthalmology;  Laterality: Left;  CDE: 1.17   CERVICAL DISC SURGERY     COLONOSCOPY  2013   Hyperplastic polyp   COLONOSCOPY WITH PROPOFOL  N/A 02/17/2017   Surgeon: Shaaron Lamar HERO, MD; Many small and large mouth diverticula in the sigmoid, descending, transverse colon, internal hemorrhoids that were medium sized and grade 1.   COLONOSCOPY WITH PROPOFOL  N/A 12/28/2021   Surgeon: Cindie Carlin POUR, DO;  Diverticulosis in the sigmoid colon and descending colon.  Likely benign tumor in the ascending colon.   ESOPHAGOGASTRODUODENOSCOPY (EGD) WITH PROPOFOL  N/A 12/28/2021   Procedure: ESOPHAGOGASTRODUODENOSCOPY (EGD) WITH PROPOFOL ;  Surgeon: Cindie Carlin POUR, DO;  [facility];  Service: Endoscopy;  Laterality: N/A;   FEMORAL-POPLITEAL BYPASS GRAFT  11/06/2005   ILIAC ARTERY STENT  10/03/2011   Right CIA stenting   INSERTION, STENT, DRUG-ELUTING, LACRIMAL CANALICULUS Right 04/18/2023   Procedure: INSERTION, STENT, DRUG-ELUTING, LACRIMAL CANALICULUS;  Surgeon: Juli Blunt, MD;  Location: [facility];  Service: Ophthalmology;  Laterality: Right;   INSERTION, STENT, DRUG-ELUTING, LACRIMAL CANALICULUS Left 05/02/2023   Procedure: INSERTION, STENT, DRUG-ELUTING, LACRIMAL CANALICULUS;  Surgeon: Juli Blunt, MD;  Location: [facility];  Service: Ophthalmology;  Laterality: Left;   LEFT  HEART CATH AND CORONARY ANGIOGRAPHY N/A 04/14/2019   Procedure: LEFT HEART CATH AND CORONARY ANGIOGRAPHY;  Surgeon: Burnard Debby LABOR, MD;  Location: [facility] INVASIVE CV LAB;  Service: Cardiovascular;  Laterality: N/A;   PERIPHERAL VASCULAR BALLOON ANGIOPLASTY Right 01/16/2017   Procedure: PERIPHERAL VASCULAR BALLOON ANGIOPLASTY;  Surgeon: Laurence Redell CROME, MD;  Location: [facility] INVASIVE CV LAB;  Service: Cardiovascular;  Laterality: Right;  common iliac    PERIPHERAL VASCULAR BALLOON ANGIOPLASTY  03/09/2020   Procedure: PERIPHERAL VASCULAR BALLOON ANGIOPLASTY;  Surgeon: Gretta Lonni PARAS, MD;  Location: [facility] INVASIVE CV LAB;  Service: Cardiovascular;;   PR VEIN BYPASS GRAFT,AORTO-FEM-POP  06/22/2010   Redo Left Fem-Pop   Repair of left arm fracture     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    Immunization History  Administered  Date(s) Administered   Influenza, High Dose Seasonal PF 10/14/2016, 09/21/2018   Influenza-Unspecified 11/08/2008, 12/10/2019   Moderna Sars-Covid-2 Vaccination 02/19/2019, 03/22/2019, 11/23/2019   PPD Test 09/22/2007   Td (Adult),5 Lf Tetanus Toxid, Preservative Free 02/09/2001   Tdap 02/09/2001, 08/05/2018     Objective: Vital Signs: BP 111/66 (BP Location: Left Arm, Patient Position: Sitting, Cuff Size: Normal)   Pulse 90   Resp 16   Ht 5' 9 (1.753 m)   Wt 169 lb (76.7 kg)   BMI 24.96 kg/m    Physical Exam Vitals and nursing note reviewed.  Constitutional:      Appearance: He is well-developed.  HENT:     Head: Normocephalic and atraumatic.   Eyes:     Conjunctiva/sclera: Conjunctivae normal.     Pupils: Pupils are equal, round, and reactive to light.    Cardiovascular:     Rate and Rhythm: Normal rate and regular rhythm.     Heart sounds: Normal heart sounds.  Pulmonary:     Effort: Pulmonary effort is normal.     Breath sounds: Normal breath sounds.   Abdominal:     General: Bowel sounds are normal.     Palpations: Abdomen is soft.   Musculoskeletal:     Cervical back: Normal range of motion and neck supple.   Skin:    General: Skin is warm and dry.     Capillary Refill: Capillary refill takes less than 2 seconds.   Neurological:     Mental Status: He is alert and oriented to person, place, and time.   Psychiatric:        Behavior: Behavior normal.      Musculoskeletal Exam: C-spine has limited range of motion with lateral rotation.  Discomfort range of motion of both shoulders with tenderness to palpation of the right shoulder.  Flexion contracture of both elbows.  Limited range of motion of both wrist joints with some tenderness of the left wrist.  Synovial thickening of MCP joints but no active synovitis noted.  PIP and DIP thickening noted.  Limited extension of PIPs.  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  Hip joints have slightly limited range of motion with discomfort in the left hip.  Knee joints have good range of motion with no warmth or effusion.  Ankle joints have good range of motion with no tenderness or joint swelling.  Thickening of bilateral first MTP joints. PIP and DIP thickening.   CDAI Exam: CDAI Score: -- Patient Global: --; Provider Global: -- Swollen: --; Tender: -- Joint Exam 07/03/2023   No joint exam has been documented for this visit   There is currently no information documented on the homunculus. Go to the Rheumatology activity and complete the homunculus joint exam.  Investigation: No additional findings.  Imaging: No results found.  Recent Labs: Lab Results  Component Value Date   WBC 7.1 01/24/2023   HGB 12.5 (L) 01/24/2023   PLT 338 01/24/2023   NA 141 12/02/2022   K 4.0 12/02/2022   CL 106 12/02/2022   CO2 27 10/16/2022   GLUCOSE 145 (H) 12/02/2022   BUN 13 12/02/2022   CREATININE 0.90 12/02/2022   BILITOT 0.4 01/24/2023   ALKPHOS 79 01/25/2021   AST 15  01/24/2023   ALT 17 01/24/2023   PROT 7.2 01/24/2023   ALBUMIN 4.5 01/25/2021   CALCIUM  9.2 10/16/2022   GFRAA 83 01/27/2020   QFTBGOLDPLUS NEGATIVE 01/24/2023    Speciality Comments: Prior therapy includes:  methotrexate ( d/c alcohol use), Arava (d/c patient preference), Orenica (patient declined)  Procedures:  No procedures performed Allergies: Penicillins    Assessment / Plan:     Visit Diagnoses: 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 factor (HCC) - Patient resume taking Xeljanz  5 mg 1 tablet by mouth daily on 05/23/2023.  He continues to tolerate Xeljanz  without any side effects or recurrent infections.  While holding Xeljanz  around the time of eye surgery he had noticed more signs of active rheumatoid arthritis flares.  He has continued to have intermittent flares over the past several weeks including in the right shoulder, left wrist, and left hip.  On 06/19/2023 he was mowing the lawn and hit his right great toe on something in the yard which caused an exacerbation of pain.  He went for x-rays of the right foot on 06/20/2023 which did not reveal any acute abnormality or fracture.  He has severe arthritis involving the first MTP joint which was felt to be due to underlying osteoarthritis.  On examination today he has synovial thickening but no active synovitis was noted.  Plan to update x-rays of both hands and the left foot to assess for radiographic progression.  X-rays of the left hip were also obtained for further evaluation.  Plan to check sed rate and CRP today.   Discussed the option of increasing Xeljanz  to 5 mg alternating with 10 mg every other day to help better manage the frequency of flares but he would like to hold off on making any medication changes at this time.   He will follow-up in the office in 2 months or sooner if needed.  Plan: XR Hand 2 View Right, XR Hand 2 View Left, XR Foot 2 Views Left, C-reactive protein, Sedimentation rate  High risk  medication use - Xeljanz  5 mg 1 tablet by mouth daily.  Previous therapy: Methotrexate d/c- alcohol use, Orencia declined by patient, treated with Xeljanz  initially at [facility].  CBC and CMP updated on 03/30/23. Orders of CBC and CMP released today.  Lipid panel 01/24/23. TB gold negative on 01/24/23 No recent or recurrent infections.  Discussed the importance of holding xeljanz  if he develops signs or symptoms of an infection and to resume once the infection has completely cleared.  - Plan: CBC with Differential/Platelet, Comprehensive metabolic panel with GFR  Rotator cuff syndrome of right shoulder: Limited range of motion.  Intermittent flares.  Contracture of joint of both elbows: Limited extension of both elbows.  No tenderness along the joint line.   Primary osteoarthritis of both hands: PIP and DIP thickening.  Limited extension of PIP joints.  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  He recently had a flare in the left wrist but has no active inflammation on examination today.  Plan to check sed rate and CRP.  X-rays of both hands were obtained to assess for radiographic progression.  Pain in left hip -He has been experiencing increased pain in the left hip.  At times he has difficulty ambulating due to severity of pain.  He has limited range of motion of the left hip joint on examination today.  X-rays of the left hip updated for further evaluation.  Plan: XR HIP UNILAT W OR W/O PELVIS 2-3 VIEWS LEFT  History of  bilateral carpal tunnel release: Not currently symptomatic.  Chronic pain of left knee: Intermittent discomfort.  No effusion noted.  Primary osteoarthritis of both feet: X-rays of the right foot updated 06/20/2023:  Severe arthritis of the first metatarsal phalangeal joint, favored to be secondary to osteoarthritis.  No active synovitis was noted today.  Plan to check sed rate and CRP today. X-rays of the left foot were obtained to assess for radiographic  progression.  DDD (degenerative disc disease), cervical: Limited range of motion with lateral rotation.   Other medical conditions are listed as follows:   Palpitations  Mixed hyperlipidemia: lipid panel updated on 01/24/23.   Essential hypertension: BP was 111/66 today in the office.    COPD  GOLD II  Atherosclerosis of native coronary artery of native heart without angina pectoris  History of diabetes mellitus  PVD (peripheral vascular disease) (HCC)    Orders: Orders Placed This Encounter  Procedures   XR Hand 2 View Right   XR Hand 2 View Left   XR Foot 2 Views Left   XR HIP UNILAT W OR W/O PELVIS 2-3 VIEWS LEFT   CBC with Differential/Platelet   Comprehensive metabolic panel with GFR   C-reactive protein   Sedimentation rate   No orders of the defined types were placed in this encounter.    Follow-Up Instructions: Return in about 2 months (around 09/02/2023) for Rheumatoid arthritis.   Waddell CHRISTELLA Craze, PA-C  Note - This record has been created using Dragon software.  Chart creation errors have been sought, but may not always  have been located. Such creation errors do not reflect on  the standard of medical care.

## 2023-07-03 ENCOUNTER — Ambulatory Visit (INDEPENDENT_AMBULATORY_CARE_PROVIDER_SITE_OTHER)

## 2023-07-03 ENCOUNTER — Ambulatory Visit

## 2023-07-03 ENCOUNTER — Ambulatory Visit: Attending: Physician Assistant | Admitting: Physician Assistant

## 2023-07-03 ENCOUNTER — Encounter: Payer: Self-pay | Admitting: Physician Assistant

## 2023-07-03 VITALS — BP 111/66 | HR 90 | Resp 16 | Ht 69.0 in | Wt 169.0 lb

## 2023-07-03 DIAGNOSIS — M75101 Unspecified rotator cuff tear or rupture of right shoulder, not specified as traumatic: Secondary | ICD-10-CM

## 2023-07-03 DIAGNOSIS — M24521 Contracture, right elbow: Secondary | ICD-10-CM

## 2023-07-03 DIAGNOSIS — M0609 Rheumatoid arthritis without rheumatoid factor, multiple sites: Secondary | ICD-10-CM | POA: Diagnosis not present

## 2023-07-03 DIAGNOSIS — M79641 Pain in right hand: Secondary | ICD-10-CM | POA: Diagnosis not present

## 2023-07-03 DIAGNOSIS — M79672 Pain in left foot: Secondary | ICD-10-CM | POA: Diagnosis not present

## 2023-07-03 DIAGNOSIS — J449 Chronic obstructive pulmonary disease, unspecified: Secondary | ICD-10-CM

## 2023-07-03 DIAGNOSIS — I10 Essential (primary) hypertension: Secondary | ICD-10-CM

## 2023-07-03 DIAGNOSIS — M25552 Pain in left hip: Secondary | ICD-10-CM

## 2023-07-03 DIAGNOSIS — I2510 Atherosclerotic heart disease of native coronary artery without angina pectoris: Secondary | ICD-10-CM

## 2023-07-03 DIAGNOSIS — M19042 Primary osteoarthritis, left hand: Secondary | ICD-10-CM

## 2023-07-03 DIAGNOSIS — M19072 Primary osteoarthritis, left ankle and foot: Secondary | ICD-10-CM

## 2023-07-03 DIAGNOSIS — Z9889 Other specified postprocedural states: Secondary | ICD-10-CM

## 2023-07-03 DIAGNOSIS — M19041 Primary osteoarthritis, right hand: Secondary | ICD-10-CM

## 2023-07-03 DIAGNOSIS — M19071 Primary osteoarthritis, right ankle and foot: Secondary | ICD-10-CM

## 2023-07-03 DIAGNOSIS — M25562 Pain in left knee: Secondary | ICD-10-CM

## 2023-07-03 DIAGNOSIS — M24522 Contracture, left elbow: Secondary | ICD-10-CM

## 2023-07-03 DIAGNOSIS — Z79899 Other long term (current) drug therapy: Secondary | ICD-10-CM

## 2023-07-03 DIAGNOSIS — G8929 Other chronic pain: Secondary | ICD-10-CM

## 2023-07-03 DIAGNOSIS — R002 Palpitations: Secondary | ICD-10-CM

## 2023-07-03 DIAGNOSIS — E782 Mixed hyperlipidemia: Secondary | ICD-10-CM

## 2023-07-03 DIAGNOSIS — Z8639 Personal history of other endocrine, nutritional and metabolic disease: Secondary | ICD-10-CM

## 2023-07-03 DIAGNOSIS — M79642 Pain in left hand: Secondary | ICD-10-CM | POA: Diagnosis not present

## 2023-07-03 DIAGNOSIS — M5030 Other cervical disc degeneration, unspecified cervical region: Secondary | ICD-10-CM

## 2023-07-03 DIAGNOSIS — I739 Peripheral vascular disease, unspecified: Secondary | ICD-10-CM

## 2023-07-04 ENCOUNTER — Other Ambulatory Visit: Payer: Self-pay | Admitting: Vascular Surgery

## 2023-07-04 DIAGNOSIS — I779 Disorder of arteries and arterioles, unspecified: Secondary | ICD-10-CM

## 2023-07-04 DIAGNOSIS — T82599A Other mechanical complication of unspecified cardiac and vascular devices and implants, initial encounter: Secondary | ICD-10-CM

## 2023-07-04 DIAGNOSIS — I70213 Atherosclerosis of native arteries of extremities with intermittent claudication, bilateral legs: Secondary | ICD-10-CM

## 2023-07-04 DIAGNOSIS — I739 Peripheral vascular disease, unspecified: Secondary | ICD-10-CM

## 2023-07-04 DIAGNOSIS — Z95828 Presence of other vascular implants and grafts: Secondary | ICD-10-CM

## 2023-07-04 LAB — CBC WITH DIFFERENTIAL/PLATELET
Absolute Lymphocytes: 1923 {cells}/uL (ref 850–3900)
Absolute Monocytes: 479 {cells}/uL (ref 200–950)
Basophils Absolute: 38 {cells}/uL (ref 0–200)
Basophils Relative: 0.5 %
Eosinophils Absolute: 91 {cells}/uL (ref 15–500)
Eosinophils Relative: 1.2 %
HCT: 43.8 % (ref 38.5–50.0)
Hemoglobin: 13.4 g/dL (ref 13.2–17.1)
MCH: 27.9 pg (ref 27.0–33.0)
MCHC: 30.6 g/dL — ABNORMAL LOW (ref 32.0–36.0)
MCV: 91.3 fL (ref 80.0–100.0)
MPV: 10.3 fL (ref 7.5–12.5)
Monocytes Relative: 6.3 %
Neutro Abs: 5069 {cells}/uL (ref 1500–7800)
Neutrophils Relative %: 66.7 %
Platelets: 331 10*3/uL (ref 140–400)
RBC: 4.8 10*6/uL (ref 4.20–5.80)
RDW: 15.2 % — ABNORMAL HIGH (ref 11.0–15.0)
Total Lymphocyte: 25.3 %
WBC: 7.6 10*3/uL (ref 3.8–10.8)

## 2023-07-04 LAB — COMPREHENSIVE METABOLIC PANEL WITH GFR
AG Ratio: 1.4 (calc) (ref 1.0–2.5)
ALT: 20 U/L (ref 9–46)
AST: 20 U/L (ref 10–35)
Albumin: 4.4 g/dL (ref 3.6–5.1)
Alkaline phosphatase (APISO): 83 U/L (ref 35–144)
BUN: 12 mg/dL (ref 7–25)
CO2: 27 mmol/L (ref 20–32)
Calcium: 9.1 mg/dL (ref 8.6–10.3)
Chloride: 102 mmol/L (ref 98–110)
Creat: 1.07 mg/dL (ref 0.70–1.28)
Globulin: 3.2 g/dL (ref 1.9–3.7)
Glucose, Bld: 191 mg/dL — ABNORMAL HIGH (ref 65–99)
Potassium: 4.1 mmol/L (ref 3.5–5.3)
Sodium: 139 mmol/L (ref 135–146)
Total Bilirubin: 0.4 mg/dL (ref 0.2–1.2)
Total Protein: 7.6 g/dL (ref 6.1–8.1)
eGFR: 74 mL/min/{1.73_m2} (ref >=60)

## 2023-07-04 LAB — C-REACTIVE PROTEIN: CRP: 10.4 mg/L — ABNORMAL HIGH (ref <8.0)

## 2023-07-04 LAB — SEDIMENTATION RATE: Sed Rate: 6 mm/h (ref 0–20)

## 2023-07-06 ENCOUNTER — Ambulatory Visit: Payer: Self-pay | Admitting: Physician Assistant

## 2023-07-06 NOTE — Progress Notes (Signed)
 CBC stable.  Glucose is 191. Rest of CMP WNL ESR WNL CRP is slightly elevated.

## 2023-07-07 NOTE — Progress Notes (Unsigned)
 Referring Provider: [name], MD Primary Care Physician:  [name], MD Primary GI Physician: Dr. Cindie  Chief Complaint  Patient presents with   Follow-up    Follow up. Having a bad head ache today     HPI:   [name] is a 73 y.o. male with history of severe peripheral vascular disease status post multiple interventions, CAD, heart palpitations, COPD, DVT, HTN, HLD, type 2 diabetes, rheumatoid arthritis, IDA previously, GERD, H. 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   Last seen in the office 12/16/2022.  GERD was controlled on Dexilant  60 mg daily.  He was using Trulance  about once a week which worked produce 1-2 diarrhea type stools, otherwise would have hard or incomplete bowel movements on a daily basis.  Did not want to take Trulance  daily due to concerns about diarrhea.  Reported Linzess  145 mcg was too strong, Linzess  72 mcg not strong enough, and preferred to continue his current regimen with Trulance .  Overall, abdominal pain improved.  Advised that he start Colace 100 mg daily and increase up to a total of 300 mg daily if needed.  To continue to use Trulance  as needed.   Today: GERD: Well controlled on Dexilant  60 mg daily.    Constipation/IBS: Eating cherries daily and bowels are moving well. Pretty much daily. Takes Trulance  about once a week to get cleaned out. No brbpr or melena.  No abdominal pain.   Past Medical History:  Diagnosis Date   Anemia    Bronchitis    Carotid artery disease (HCC)    Cervical spine disease    Neck surgery in 2007 with Dr. Leeann   Collagen vascular disease [facility])    COPD (chronic obstructive pulmonary disease) (HCC)    Coronary atherosclerosis of  native coronary artery    Nonobstrtuctive by catheterization 2007, 2013, 2021   DVT (deep venous thrombosis) (HCC)    Essential hypertension    GERD (gastroesophageal reflux disease)    Hyperlipidemia    PAD (peripheral artery disease) (HCC)    s/p left fem-pop bypass 2007; PTA and stenting of right iliac artery 09/2011   Rheumatoid arthritis(714.0)    Type 2 diabetes mellitus (HCC)     Past Surgical History:  Procedure Laterality Date   ABDOMINAL AORTAGRAM N/A 10/03/2011   Procedure: ABDOMINAL EZELLA;  Surgeon: Redell LITTIE Door, MD;  Location: [facility] CATH LAB;  Service: Cardiovascular;  Laterality: N/A;   ABDOMINAL AORTOGRAM W/LOWER EXTREMITY N/A 01/16/2017   Procedure: ABDOMINAL AORTOGRAM W/LOWER EXTREMITY;  Surgeon: Door Redell LITTIE, MD;  Location: [facility] INVASIVE CV LAB;  Service: Cardiovascular;  Laterality: N/A;   ABDOMINAL AORTOGRAM W/LOWER EXTREMITY Bilateral 08/04/2019   Procedure: ABDOMINAL AORTOGRAM W/LOWER EXTREMITY;  Surgeon: Gretta Lonni PARAS, MD;  Location: [facility] INVASIVE CV LAB;  Service: Cardiovascular;  Laterality: Bilateral;   ABDOMINAL AORTOGRAM W/LOWER EXTREMITY N/A 03/09/2020   Procedure: ABDOMINAL AORTOGRAM W/LOWER EXTREMITY;  Surgeon: Gretta Lonni PARAS, MD;  Location: [facility] INVASIVE CV LAB;  Service: Cardiovascular;  Laterality: N/A;   ABDOMINAL AORTOGRAM W/LOWER EXTREMITY N/A 12/02/2022   Procedure: ABDOMINAL AORTOGRAM W/LOWER EXTREMITY;  Surgeon: Pearline Norman RAMAN, MD;  Location: [facility] INVASIVE CV LAB;  Service: Cardiovascular;  Laterality: N/A;   BIOPSY N/A 12/28/2021   Procedure: BIOPSY;  Surgeon:  Cindie Carlin POUR, DO;  Location: [facility] ENDO SUITE;  Service: Endoscopy;  Laterality: N/A;   CATARACT EXTRACTION W/PHACO Right 04/18/2023   Procedure: PHACOEMULSIFICATION, CATARACT, WITH IOL INSERTION;  Surgeon: Juli Blunt, MD;  Location: [facility] ORS;  Service: Ophthalmology;  Laterality: Right;  CDE: 1.81   CATARACT EXTRACTION W/PHACO Left 05/02/2023   Procedure: PHACOEMULSIFICATION,  CATARACT, WITH IOL INSERTION;  Surgeon: Juli Blunt, MD;  Location: [facility] ORS;  Service: Ophthalmology;  Laterality: Left;  CDE: 1.17   CERVICAL DISC SURGERY     COLONOSCOPY  2013   Hyperplastic polyp   COLONOSCOPY WITH PROPOFOL  N/A 02/17/2017   Surgeon: Shaaron Lamar HERO, MD; Many small and large mouth diverticula in the sigmoid, descending, transverse colon, internal hemorrhoids that were medium sized and grade 1.   COLONOSCOPY WITH PROPOFOL  N/A 12/28/2021   Surgeon: Cindie Carlin POUR, DO;  Diverticulosis in the sigmoid colon and descending colon.  Likely benign tumor in the ascending colon.   ESOPHAGOGASTRODUODENOSCOPY (EGD) WITH PROPOFOL  N/A 12/28/2021   Procedure: ESOPHAGOGASTRODUODENOSCOPY (EGD) WITH PROPOFOL ;  Surgeon: Cindie Carlin POUR, DO;  Location: [facility] END[facility]ITE;  Service: Endoscopy;  Laterality: N/A;   FEMORAL-POPLITEAL BYPASS GRAFT  11/06/2005   ILIAC ARTERY STENT  10/03/2011   Right CIA stenting   INSERTION, STENT, DRUG-ELUTING, LACRIMAL CANALICULUS Right 04/18/2023   Procedure: INSERTION, STENT, DRUG-ELUTING, LACRIMAL CANALICULUS;  Surgeon: Juli Blunt, MD;  Location: [facility] ORS;  Service: Ophthalmology;  Laterality: Right;   INSERTION, STENT, DRUG-ELUTING, LACRIMAL CANALICULUS Left 05/02/2023   Procedure: INSERTION, STENT, DRUG-ELUTING, LACRIMAL CANALICULUS;  Surgeon: Juli Blunt, MD;  Location: [facility] ORS;  Service: Ophthalmology;  Laterality: Left;   LEFT HEART CATH AND CORONARY ANGIOGRAPHY N/A 04/14/2019   Procedure: LEFT HEART CATH AND CORONARY ANGIOGRAPHY;  Surgeon: Burnard Debby LABOR, MD;  Location: [facility] INVASIVE CV LAB;  Service: Cardiovascular;  Laterality: N/A;   PERIPHERAL VASCULAR BALLOON ANGIOPLASTY Right 01/16/2017   Procedure: PERIPHERAL VASCULAR BALLOON ANGIOPLASTY;  Surgeon: Laurence Redell CROME, MD;  Location: [facility] INVASIVE CV LAB;  Service: Cardiovascular;  Laterality: Right;  common iliac    PERIPHERAL VASCULAR BALLOON ANGIOPLASTY  03/09/2020   Procedure: PERIPHERAL  VASCULAR BALLOON ANGIOPLASTY;  Surgeon: Gretta Lonni PARAS, MD;  Location: [facility] INVASIVE CV LAB;  Service: Cardiovascular;;   PR VEIN BYPASS GRAFT,AORTO-FEM-POP  06/22/2010   Redo Left Fem-Pop   Repair of left arm fracture     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 8 g 2   atorvastatin  (LIPITOR) 80 MG tablet TAKE 1 TABLET(80 MG) BY MOUTH DAILY 90 tablet 2   cilostazol  (PLETAL ) 100 MG tablet TAKE 1 TABLET(100 MG) BY MOUTH TWICE DAILY 180 tablet 3   ciprofloxacin (CILOXAN) 0.3 % ophthalmic solution Place 1 drop into the right eye 4 (four) times daily.     clopidogrel  (PLAVIX ) 75 MG tablet Take 1 tablet (75 mg total) by mouth daily. 30 tablet 11   dexlansoprazole  (DEXILANT ) 60 MG capsule Take 1 capsule (60 mg total) by mouth daily. 30 capsule 11   diclofenac  Sodium (VOLTAREN ) 1 % GEL Apply 2 gram  to painful foot and ankle joint twice daily. 150 g 1   empagliflozin (JARDIANCE) 25 MG TABS tablet Take 25 mg by mouth in the morning.  glipiZIDE (GLUCOTROL) 5 MG tablet Take 5-10 mg by mouth See admin instructions.     HYDROcodone-acetaminophen  (NORCO) 7.5-325 MG per tablet Take 1 tablet by mouth 2 (two) times daily as needed (pain.).     JANUVIA 100 MG tablet Take 100 mg by mouth in the morning.     ketoconazole  (NIZORAL ) 2 % cream Apply to both feet and between toes once daily for 6 weeks. 60 g 1   ketorolac  (ACULAR ) 0.5 % ophthalmic solution Place 1 drop into the right eye 4 (four) times daily.     LANTUS SOLOSTAR 100 UNIT/ML Solostar Pen Inject 10 Units into the skin at bedtime.     metoprolol  succinate (TOPROL  XL) 25 MG 24 hr tablet Take 0.5 tablets (12.5 mg total) by mouth daily.     metoprolol  tartrate (LOPRESSOR ) 100 MG tablet Take 1 (100 Mg) tablet 2  hours before your procedure 1 tablet 0   nitroGLYCERIN  (NITROSTAT ) 0.4 MG SL tablet Place 1 tablet (0.4 mg total) under the tongue every 5 (five) minutes x 3 doses as needed for chest pain (If no relief after 3rd dose, GO TO ER.). 25 tablet 3   Omega-3 Fatty Acids (OMEGA-3 FISH OIL PO) Take 1 capsule by mouth daily.     ONE TOUCH ULTRA TEST test strip      OneTouch Delica Lancets 33G MISC Apply topically.     Plecanatide  (TRULANCE ) 3 MG TABS Take 1 tablet (3 mg total) by mouth daily. 30 tablet 5   prednisoLONE acetate (PRED FORTE) 1 % ophthalmic suspension INSTILL 1 DROP INTO RIGHT EYE FOUR TIMES DAILY FOR 1 WEEK THEN THREE TIMES DAILY FOR 1 WEEK THEN TWICE DAILY FOR 1 WEEK THEN EVERY DAY     predniSONE  (DELTASONE ) 5 MG tablet Take 4 tabs po x 4 days, 3  tabs po x 4 days, 2  tabs po x 4 days, 1  tab po x 4 days 40 tablet 0   sildenafil (REVATIO) 20 MG tablet Take 40-60 mg by mouth daily as needed for erectile dysfunction.     SPIRIVA  RESPIMAT 2.5 MCG/ACT AERS INHALE 2 PUFFS INTO THE LUNGS DAILY 4 g 11   SYMBICORT  160-4.5 MCG/ACT inhaler INHALE 2 PUFFS FIRST THING IN THE MORNING AND THEN 2 PUFFS 12 HOURS LATER 10.2 g 5   XELJANZ  5 MG TABS Take 1 tablet (5 mg total) by mouth daily. 90 tablet 0   No current facility-administered medications for this visit.    Allergies as of 07/09/2023 - Review Complete 07/09/2023  Allergen Reaction Noted   Penicillins Hives 03/04/2023    Family History  Problem Relation Age of Onset   Heart attack Mother 43   Deep vein thrombosis Mother    Diabetes Mother    Hyperlipidemia Mother    Hypertension Mother    Peripheral vascular disease Mother    Stroke Father    Diabetes Father    Hyperlipidemia Father    Hypertension Father    Peripheral vascular disease Father    Diabetes Sister    Hyperlipidemia Sister    Hypertension Sister    Diabetes Brother    Hyperlipidemia Brother    Cancer Brother    Deep vein thrombosis Brother    Hypertension Brother     Peripheral vascular disease Brother    Diabetes Daughter    Hyperlipidemia Son    Cancer Son    Hypertension Son    Colon cancer Neg Hx     Social History  Socioeconomic History   Marital status: Single    Spouse name: Not on file   Number of children: Not on file   Years of education: Not on file   Highest education level: Not on file  Occupational History   Not on file  Tobacco Use   Smoking status: Former    Current packs/day: 0.00    Average packs/day: 1.5 packs/day for 45.0 years (67.5 ttl pk-yrs)    Types: Cigarettes, Cigars    Start date: 09/01/1966    Quit date: 09/01/2011    Years since quitting: 11.8    Passive exposure: Never   Smokeless tobacco: Never  Vaping Use   Vaping status: Never Used  Substance and Sexual Activity   Alcohol use: Not Currently   Drug use: No   Sexual activity: Not Currently    Birth control/protection: None  Other Topics Concern   Not on file  Social History Narrative   The patient lives in [place].    Social Drivers of Corporate investment banker Strain: Not on file  Food Insecurity: Not on file  Transportation Needs: Not on file  Physical Activity: Not on file  Stress: Not on file  Social Connections: Not on file    Review of Systems: Gen: Denies fever, chills, cold or blood symptoms, presyncope, syncope. CV: Denies chest pain, palpitations. Resp: Denies dyspnea, cough. GI: See HPI  Heme: See HPI  Physical Exam: BP 127/73 (BP Location: Left Arm, Patient Position: Sitting, Cuff Size: Normal)   Pulse 92   Temp 98 F (36.7 C) (Temporal)   Ht 5' 9 (1.753 m)   Wt 168 lb 3.2 oz (76.3 kg)   BMI 24.84 kg/m  General:   Alert and oriented. No distress noted. Pleasant and cooperative.  Head:  Normocephalic and atraumatic. Eyes:  Conjuctiva clear without scleral icterus. Abdomen:  +BS, soft, non-tender and non-distended. No rebound or guarding. No HSM or masses noted. Msk:  Symmetrical without gross deformities. Normal  posture. Extremities:  Without edema. Neurologic:  Alert and  oriented x4 Psych:  Normal mood and affect.    Assessment:  73 y.o. male with history of severe peripheral vascular disease status post multiple interventions, CAD, heart palpitations, COPD, DVT, HTN, HLD, type 2 diabetes, rheumatoid arthritis, IDA previously, GERD, H. 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  GERD: Well-controlled on Dexilant  60 mg daily.  Chronic constipation: Controlling with diet and using Trulance  about once a week.  Cystic ascending colonic mass: Noted on colonoscopy in December 2023.  Follow-up CT in December with 2.4 cm cystic lesion along the posterior wall of the mid ascending colon. Dr. Cindie discussed case further with radiology and personally reviewed films with them who confirmed likely cystic mass. No evidence of metastases. Discussed case with general surgery as well and patient saw Dr. Mavis in office. Recommended surveillance.  Follow-up CT A/P with contrast June 2024 redemonstrating 2.1 x 2.6 cm hypoattenuating lesion in the proximal ascending colon posteriorly, no associated solid component or surrounding fat stranding.  No metastatic process in the abdomen pelvis. Recommended 1 year surveillance CT which is due now.    Plan:  CT A/P with contrast Continue Dexilant  60 mg daily. Continue Trulance  as needed. Follow-up in 6 months.   Josette Centers, PA-C [facility] 07/09/2023

## 2023-07-08 ENCOUNTER — Ambulatory Visit (HOSPITAL_COMMUNITY)

## 2023-07-08 NOTE — Progress Notes (Signed)
 X-rays of the left hip are consistent with severe arthritis.  Please notify the patient.  If his symptoms persist or worsen I would recommend evaluation by orthopedics to determine if he would be a candidate for an injection vs. Surgical intervention.

## 2023-07-08 NOTE — Progress Notes (Signed)
 X-rays of both hands consistent with OA and RA overlap.  No radiographic progression noted when compared to XR from 2022.

## 2023-07-08 NOTE — Progress Notes (Signed)
 X-rays of the left foot consistent with RA and OA overlap.  No erosive changes noted.   Possible increased narrowing in the subtalar joint-compared to XR 2022.

## 2023-07-09 ENCOUNTER — Ambulatory Visit (HOSPITAL_COMMUNITY)
Admission: RE | Admit: 2023-07-09 | Discharge: 2023-07-09 | Disposition: A | Discharge status: Home / Self Care | Source: Ambulatory Visit | Attending: Acute Care | Admitting: Acute Care

## 2023-07-09 ENCOUNTER — Encounter: Payer: Self-pay | Admitting: Gastroenterology

## 2023-07-09 ENCOUNTER — Ambulatory Visit (INDEPENDENT_AMBULATORY_CARE_PROVIDER_SITE_OTHER): Admitting: Gastroenterology

## 2023-07-09 VITALS — BP 127/73 | HR 92 | Temp 98.0°F | Ht 69.0 in | Wt 168.2 lb

## 2023-07-09 DIAGNOSIS — K639 Disease of intestine, unspecified: Secondary | ICD-10-CM | POA: Diagnosis not present

## 2023-07-09 DIAGNOSIS — K219 Gastro-esophageal reflux disease without esophagitis: Secondary | ICD-10-CM | POA: Diagnosis not present

## 2023-07-09 DIAGNOSIS — K5900 Constipation, unspecified: Secondary | ICD-10-CM

## 2023-07-09 DIAGNOSIS — K5909 Other constipation: Secondary | ICD-10-CM

## 2023-07-09 DIAGNOSIS — Z122 Encounter for screening for malignant neoplasm of respiratory organs: Secondary | ICD-10-CM | POA: Insufficient documentation

## 2023-07-09 DIAGNOSIS — Z87891 Personal history of nicotine dependence: Secondary | ICD-10-CM | POA: Insufficient documentation

## 2023-07-09 DIAGNOSIS — D490 Neoplasm of unspecified behavior of digestive system: Secondary | ICD-10-CM

## 2023-07-09 NOTE — Patient Instructions (Signed)
 Will get you scheduled for CT of your abdomen and pelvis to follow-up on the lesion in your colon.  Continue Dexilant  60 mg daily.  Continue Trulance  as needed.  I will see back in 6 months or sooner if needed.  It was nice to see you today!  Josette Centers, PA-C [facility]

## 2023-07-16 ENCOUNTER — Telehealth: Payer: Self-pay

## 2023-07-16 NOTE — Telephone Encounter (Signed)
 Pt called c/o cramping legs, rest pain at night. Pt reported normal color and temperature of legs and feet. Reports no ulcers on feet. Pt advised to remain active and walk as tolerated. Pt advised to sit in a dependent position when not walking.  Pt has vascular US  and ABIs scheduled for 07/21/23. Advised to keep those appts.  Pt advised to go ED if legs/feet become cold, pale or blue.  Pt verbally agreed.

## 2023-07-18 ENCOUNTER — Encounter: Payer: Self-pay | Admitting: Nurse Practitioner

## 2023-07-18 ENCOUNTER — Ambulatory Visit: Attending: Nurse Practitioner | Admitting: Nurse Practitioner

## 2023-07-18 VITALS — BP 110/60 | HR 99 | Ht 69.0 in | Wt 163.8 lb

## 2023-07-18 DIAGNOSIS — I739 Peripheral vascular disease, unspecified: Secondary | ICD-10-CM

## 2023-07-18 DIAGNOSIS — I25119 Atherosclerotic heart disease of native coronary artery with unspecified angina pectoris: Secondary | ICD-10-CM | POA: Diagnosis not present

## 2023-07-18 DIAGNOSIS — E785 Hyperlipidemia, unspecified: Secondary | ICD-10-CM

## 2023-07-18 DIAGNOSIS — I5032 Chronic diastolic (congestive) heart failure: Secondary | ICD-10-CM | POA: Diagnosis not present

## 2023-07-18 DIAGNOSIS — I10 Essential (primary) hypertension: Secondary | ICD-10-CM

## 2023-07-18 DIAGNOSIS — I6523 Occlusion and stenosis of bilateral carotid arteries: Secondary | ICD-10-CM

## 2023-07-18 DIAGNOSIS — R0789 Other chest pain: Secondary | ICD-10-CM

## 2023-07-18 NOTE — Patient Instructions (Addendum)

## 2023-07-18 NOTE — Progress Notes (Signed)
 Cardiology Office Note:    Date: 07/17/2023 ID:  [name], DOB 23-Apr-1950, MRN [id number] PCP:  [name], MD [facility] Providers Cardiologist:  Jayson Sierras, MD    Referring MD: [name], MD   CC: Here for follow-up  History of Present Illness:    [name] is a very pleasant 73 y.o. male with a PMH of HFmrEF, palpitations/PSVT, HLD, PAD, carotid artery disease, COPD, T2DM, RA, HTN, GERD, and hx of DVT, who presents today for scheduled follow-up.    Previous cardiovascular history of nonobstructive CAD by cardiac catheterization as seen in 2007, 2013, and 2021.  He is closely followed by [facility] for history of PAD, status post left fem-pop bypass in 2007, with PTCA and stenting of right iliac artery in 2013.  Recent cardiac monitor demonstrated brief episodes of PSVT.  Was started on beta-blocker.  Echocardiogram revealed mildly reduced left ventricular systolic function at 45 to 50%, mild apical septal hypokinesis.  Last seen by Dr. Sierras on January 17, 2022.  Was doing well at that time.  Claudication was stable, symptoms noted more on right than left.  Lexiscan  Myoview  was arranged and revealed findings consistent with inferior wall infarct scar and moderate peri-infarct ischemia, EF measured at 53%.  Patient did not take beta-blocker as instructed from previous office visit, Dr. Sierras started Toprol -XL 12.5 mg daily.   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   I last saw him for follow-up on September 18, 2022. Was doing well.    01/16/2023 - He recently contacted our office noting intermittent chest pain x 2 weeks. Today he presents for chest pain evaluation, describes this as occurring along under his left shoulder that radiates to his chest. He reported nausea and dizziness associated with the pain. Stated pain worse at night with laying down on this side. He  describes the pain as moving middle of chest to under his left armpit for me today. This has been chronic and stable over time. Denies any worsening shortness of breath, palpitations, syncope, presyncope, dizziness, orthopnea, PND, swelling or significant weight changes, acute bleeding, or claudication.  CCTA 02/2023 revealed coronary calcium  score of 734, with findings of minimal, nonobstructive CAD.   04/01/2023 - He presents today for follow-up. Not having CP as often as he used to, believes his symptoms are MSK related, sometimes experiences this when he exercises. Denies any recent/activechest pain, shortness of breath, palpitations, syncope, presyncope, dizziness, orthopnea, PND, swelling or significant weight changes, acute bleeding, or claudication.  07/18/2023 - Here for follow-up. Tells me he notices CP mainly at night, lasting for a few seconds in duration, is made worse with sleeping on his left side, and alleviated when laying on his right side. Tells he he has not taken NTG. Denies any shortness of breath, palpitations, syncope, presyncope, dizziness, orthopnea, PND, swelling or significant weight changes, acute bleeding, or claudication. Now mainly working on leg exercises in the gym.   SH: Very active and exercises every day.  ROS:   Please see the history of present illness.     All other systems reviewed and are negative.  EKGs/Labs/Other Studies Reviewed:    The following studies were reviewed today:  EKG: EKG is not ordered today.   Echo 05/2023:  1. Left ventricular ejection fraction, by estimation, is 50 to 55%. The  left ventricle has low normal function. The left ventricle has no regional  wall  motion abnormalities. Left ventricular diastolic parameters are  consistent with Grade I diastolic  dysfunction (impaired relaxation).   2. Right ventricular systolic function is normal. The right ventricular  size is normal. Tricuspid regurgitation signal is inadequate for  assessing  PA pressure.   3. The mitral valve is normal in structure. No evidence of mitral valve  regurgitation. No evidence of mitral stenosis.   4. The aortic valve is tricuspid. Aortic valve regurgitation is mild. No  aortic stenosis is present.   Comparison(s): A prior study was performed on 01/10/2022. EF 45-50%. Mild aortic valve regurgitation. Aortic dilatation of 39 mm.   Carotid duplex 03/2023:  Summary:  Right Carotid: Velocities in the right ICA are consistent with a 1-39%  stenosis. Non-hemodynamically significant plaque <50% noted in the  CCA. The ECA appears <50% stenosed.   Left Carotid: Velocities in the left ICA are consistent with a 1-39%  stenosis. Non-hemodynamically significant plaque <50% noted in the  CCA. The ECA appears <50% stenosed.   Vertebrals:  Bilateral vertebral arteries demonstrate antegrade flow.  Subclavians: Normal flow hemodynamics were seen in bilateral subclavian arteries.   *See table(s) above for measurements and observations.  Suggest follow up study in 12 months.  ABI's 02/2023:  Summary:  Right: Resting right ankle-brachial index indicates noncompressible right  lower extremity arteries. The right toe-brachial index is abnormal.   Left: Resting left ankle-brachial index indicates noncompressible left  lower extremity arteries. The left toe-brachial index is abnormal.  Vascular US  lower extremity arterial duplex 02/2023:   Summary:  Left: Patent left fem-pop bypass graft with no stenosis. Graft velocities  at the 40 cm/s mark.  CCTA 02/2023:  IMPRESSION: 1. Coronary calcium  score of 734. This was 90th percentile for age-, sex, and race-matched controls.   2. Total plaque volume 655 mm3. (Calcified plaque 137 mm3; non-calcified plaque 518 mm3). 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 Percentile for age- and sex-matched controls is not currently available.   3. Normal coronary origin with right dominance.   4. There are regions of heavily calcified plaque,  but no obstructive coronary disease. There is mild (25-49%) obstruction in the LAD. CAD-RADS 2.   5. Aortic atherosclerosis.  Vascular ultrasound bypass graft duplex 05/2022: Left: Patent left common femoral-popliteal artery bypass graft.  Vascular ultrasound aorta/IVC/iliacs Doppler limited 05/2022: See full report under CV Proc  ABIs 05/2022: Summary:  Right: Resting right ankle-brachial index indicates noncompressible right  lower extremity arteries. The right toe-brachial index is abnormal.   Left: Resting left ankle-brachial index is within normal range. The left  toe-brachial index is abnormal.  Carotid Doppler 03/2022: Summary:  Right Carotid: Velocities in the right ICA are consistent with a 1-39%  stenosis.   Left Carotid: Velocities in the left ICA are consistent with a 1-39%  stenosis.   Vertebrals: Bilateral vertebral arteries demonstrate antegrade flow.  Subclavians: Normal flow hemodynamics were seen in bilateral subclavian               arteries.   Myoview  on 01/24/2022:    Findings are consistent with prior inferior infarction with mild peri-infarct ischemia. The study is low risk.   No ST deviation was noted.   LV perfusion is abnormal. Moderate size moderate intensity inferior defect with mild reversibility.   Left ventricular function is normal. Nuclear stress EF: 53 %. The left ventricular ejection fraction is normal (55-65%). End diastolic cavity size is normal.      Echocardiogram on 01/10/2022:  1. Left ventricular ejection fraction, by estimation,  is 45 to 50%. The  left ventricle has mildly decreased function. The left ventricle  demonstrates regional wall motion abnormalities (see scoring  diagram/findings for description). There is mild  concentric left ventricular hypertrophy. Left ventricular diastolic  parameters are consistent with Grade I diastolic dysfunction (impaired  relaxation).   2. Right ventricular systolic function is normal. The  right ventricular  size is normal.   3. Left atrial size was upper normal.   4. The mitral valve is grossly normal. Trivial mitral valve  regurgitation.   5. The aortic valve is tricuspid. There is mild calcification of the  aortic valve. Aortic valve regurgitation is mild. Aortic regurgitation PHT  measures 531 msec. Aortic valve mean gradient measures 8.0 mmHg.   6. Aortic dilatation noted. There is borderline dilatation of the aortic  root, measuring 39 mm.   7. The inferior vena cava is normal in size with greater than 50%  respiratory variability, suggesting right atrial pressure of 3 mmHg.   Comparison(s): Prior images unable to be directly viewed. LVEF reported at  50% at angiography in 2021.  Monitor on 01/01/2022:  Patient had a min HR of 59 bpm (sinus bradycardia), max HR of 203 bpm (1.9 second duration of brief supraventricular tachycardia), and avg HR of 101 bpm (sinus tachycardia). Predominant underlying rhythm was Sinus Rhythm. 6 Supraventricular Tachycardia runs occurred, the run with the fastest interval lasting 5 beats (1.9 second duration) with a max rate of 203 bpm, the longest lasting 19 beats (6.8 seconds) with an avg rate of 163 bpm. Isolated SVEs were occasional (1.4%, 13864), SVE Couplets were rare (<1.0%, 88), and SVE Triplets were rare (<1.0%, 5). Isolated VEs were rare (<1.0%, 3894), VE Couplets were rare (<1.0%, 22), and VE Triplets were rare (<1.0%, 1). Ventricular Bigeminy was present.    Impression: Brief supraventricular tachycardia detected (6 episodes in 7 days; longest duration 6.8 seconds).  Occasional PACs (1.4% burden).   Cardiac cath on 04/14/2019:  Prox LAD to Mid LAD lesion is 20% stenosed. Mid LAD lesion is 30% stenosed. Prox RCA lesion is 30% stenosed.   Mild non-obstructive CAD with 20 and 30% irregularities in the proximal to mid LAD; normal ramus intermediate and left circumflex coronary arteries;  and RCA with smooth 30% proximal narrowing with  distal vessel tortuosity.   Low normal global LV function with EF estimated at 50%; LVEDP 14 mmHg.   RECOMMENDATION: Medical therapy for CAD.  Will increase lipid-lowering therapy to atorvastatin  40 mg.  Maintain optimal blood pressure control with target less than 130/80 and ideal blood pressure less than 120/80.  Target LDL less than 70.  Risk Assessment/Calculations:    The 10-year ASCVD risk score (Arnett DK, et al., 2019) is: 24.8%   Values used to calculate the score:     Age: 71 years     Clincally relevant sex: Male     Is Non-Hispanic African American: Yes     Diabetic: Yes     Tobacco smoker: No     Systolic Blood Pressure: 110 mmHg     Is BP treated: Yes     HDL Cholesterol: 53 mg/dL     Total Cholesterol: 142 mg/dL  Physical Exam:    VS:  BP 110/60   Pulse 99   Ht 5' 9 (1.753 m)   Wt 163 lb 12.8 oz (74.3 kg)   SpO2 94%   BMI 24.19 kg/m     Wt Readings from Last 3 Encounters:  07/18/23 163 lb  12.8 oz (74.3 kg)  07/09/23 168 lb 3.2 oz (76.3 kg)  07/03/23 169 lb (76.7 kg)     GEN:  Well nourished, well developed in no acute distress HEENT: Normal NECK: No JVD; no carotid bruit, mild left carotid bruit CARDIAC: S1/S2, RRR, no murmurs, rubs, gallops; 2+ pulses RESPIRATORY:  Clear to auscultation without rales, wheezing or rhonchi  MUSCULOSKELETAL:  No edema; No deformity  SKIN: Warm and dry NEUROLOGIC:  Alert and oriented x 3 PSYCHIATRIC:  Normal affect   ASSESSMENT & PLAN:    In order of problems listed above:  HFimpEF TTE 05/2023 showed EF mildly returned to normal, 50-55%, RWMA, mild LVH, grade 1 DD. Euvolemic and well compensated on exam. Continue GDMT - BP does not allow room to uptitrate GDMT at this time. Low sodium diet, fluid restriction <2L, and daily weights encouraged. Educated to contact our office for weight gain of 2 lbs overnight or 5 lbs in one week. Heart healthy diet and regular cardiovascular exercise encouraged.  Could not tolerate  Entresto .   Minimal CAD, atypical chest pain  Admits to chronic, stable symptoms that are atypical in etiology per his report. See CCTA noted above. Continue atorvastatin , Plavix , Toprol  XL, and NTG PRN. Previously educated him regarding NTG. Care and ED precautions discussed. Came to shared medical decision that we will monitor for now, Tylenol  PRN recommended.    PAD, HLD, carotid artery stenosis Last LDL 74 in January 2025. He is closely followed by [facility] for history of PAD, status post left fem-pop bypass in 2007, with PTCA and stenting of right iliac artery in 2013. See vascular studies noted above. Continue Pletal , Plavix , and Atorvastatin . Heart healthy diet and regular cardiovascular exercise encouraged.   HTN BP stable. Continue current medication regimen. Discussed to monitor BP at home at least 2 hours after medications and sitting for 5-10 minutes. Heart healthy diet and regular cardiovascular exercise encouraged.    Disposition: Follow-up with me or APP in 6-8 weeks or sooner if anything changes.   Medication Adjustments/Labs and Tests Ordered: Current medicines are reviewed at length with the patient today.  Concerns regarding medicines are outlined above.  No orders of the defined types were placed in this encounter.  No orders of the defined types were placed in this encounter.   Patient Instructions  Medication Instructions:  Your physician recommends that you continue on your current medications as directed. Please refer to the Current Medication list given to you today.  Labwork: None   Testing/Procedures: None   Follow-Up: Your physician recommends that you schedule a follow-up appointment in: 6-8 weeks   Any Other Special Instructions Will Be Listed Below (If Applicable).  If you need a refill on your cardiac medications before your next appointment, please call your pharmacy.   Signed, Almarie Crate, NP

## 2023-07-21 ENCOUNTER — Ambulatory Visit (HOSPITAL_COMMUNITY)
Admission: RE | Admit: 2023-07-21 | Discharge: 2023-07-21 | Disposition: A | Discharge status: Home / Self Care | Source: Ambulatory Visit | Attending: Surgery | Admitting: Surgery

## 2023-07-21 ENCOUNTER — Ambulatory Visit (HOSPITAL_BASED_OUTPATIENT_CLINIC_OR_DEPARTMENT_OTHER)
Admission: RE | Admit: 2023-07-21 | Discharge: 2023-07-21 | Disposition: A | Discharge status: Home / Self Care | Source: Ambulatory Visit | Attending: Surgery | Admitting: Surgery

## 2023-07-21 ENCOUNTER — Ambulatory Visit: Attending: Surgery | Admitting: Physician Assistant

## 2023-07-21 VITALS — BP 125/79 | HR 108 | Temp 97.6°F | Ht 69.0 in | Wt 163.5 lb

## 2023-07-21 DIAGNOSIS — I739 Peripheral vascular disease, unspecified: Secondary | ICD-10-CM | POA: Diagnosis present

## 2023-07-21 DIAGNOSIS — Z95828 Presence of other vascular implants and grafts: Secondary | ICD-10-CM | POA: Insufficient documentation

## 2023-07-21 DIAGNOSIS — I779 Disorder of arteries and arterioles, unspecified: Secondary | ICD-10-CM

## 2023-07-21 DIAGNOSIS — I70213 Atherosclerosis of native arteries of extremities with intermittent claudication, bilateral legs: Secondary | ICD-10-CM | POA: Insufficient documentation

## 2023-07-21 DIAGNOSIS — T82599A Other mechanical complication of unspecified cardiac and vascular devices and implants, initial encounter: Secondary | ICD-10-CM | POA: Diagnosis present

## 2023-07-21 LAB — VAS US ABI WITH/WO TBI

## 2023-07-21 NOTE — Progress Notes (Signed)
 Office Note   History of Present Illness   [name] is a 73 y.o. ([birth year]) male who presents for surveillance of PAD.  He has a history of the following vascular procedures:  1) left common femoral to above-knee popliteal artery bypass with PTFE in 2007 by Dr. Gerlean 2) redo left common femoral to above-knee popliteal artery bypass with PTFE in 2012 by Dr. Gerlean 3) right common iliac artery angioplasty and stenting in 2013 by Dr. Laurence 4) right common iliac artery angioplasty in 2019 by Dr. Laurence 5) right anterior tibial artery and SFA angioplasty in 2022 by Dr. Gretta  He returns today for follow-up.  He says that he is doing okay.  He denies any claudication, rest pain, or tissue loss.  He continues to have left groin pain aggravated by lifting heavy objects or going up stairs.  He denies any left groin pain at rest.  He also endorses left hip pain.  Current Outpatient Medications  Medication Sig Dispense Refill   albuterol  (PROAIR  HFA) 108 (90 Base) MCG/ACT inhaler Inhale 2 puffs into the lungs every 6 (six) hours as needed for wheezing or shortness of breath. 8 g 2   atorvastatin  (LIPITOR) 80 MG tablet TAKE 1 TABLET(80 MG) BY MOUTH DAILY 90 tablet 2   cilostazol  (PLETAL ) 100 MG tablet TAKE 1 TABLET(100 MG) BY MOUTH TWICE DAILY 180 tablet 3   ciprofloxacin (CILOXAN) 0.3 % ophthalmic solution Place 1 drop into the right eye 4 (four) times daily.     clopidogrel  (PLAVIX ) 75 MG tablet Take 1 tablet (75 mg total) by mouth daily. 30 tablet 11   dexlansoprazole  (DEXILANT ) 60 MG capsule Take 1 capsule (60 mg total) by mouth daily. 30 capsule 11   diclofenac  Sodium (VOLTAREN ) 1 % GEL Apply 2 gram  to painful foot and ankle joint twice daily. 150 g 1   empagliflozin (JARDIANCE) 25 MG TABS tablet Take 25 mg by mouth in the morning.     glipiZIDE (GLUCOTROL) 5 MG tablet Take 5-10 mg by mouth See admin instructions.     HYDROcodone-acetaminophen  (NORCO) 7.5-325 MG per tablet Take 1  tablet by mouth 2 (two) times daily as needed (pain.).     JANUVIA 100 MG tablet Take 100 mg by mouth in the morning.     ketoconazole  (NIZORAL ) 2 % cream Apply to both feet and between toes once daily for 6 weeks. 60 g 1   ketorolac  (ACULAR ) 0.5 % ophthalmic solution Place 1 drop into the right eye 4 (four) times daily.     LANTUS SOLOSTAR 100 UNIT/ML Solostar Pen Inject 10 Units into the skin at bedtime.     metoprolol  succinate (TOPROL  XL) 25 MG 24 hr tablet Take 0.5 tablets (12.5 mg total) by mouth daily.     metoprolol  tartrate (LOPRESSOR ) 100 MG tablet Take 1 (100 Mg) tablet 2 hours before your procedure 1 tablet 0   nitroGLYCERIN  (NITROSTAT ) 0.4 MG SL tablet Place 1 tablet (0.4 mg total) under the tongue every 5 (five) minutes x 3 doses as needed for chest pain (If no relief after 3rd dose, GO TO ER.). 25 tablet 3   Omega-3 Fatty Acids (OMEGA-3 FISH OIL PO) Take 1 capsule by mouth daily.     ONE TOUCH ULTRA TEST test strip      OneTouch Delica Lancets 33G MISC Apply topically.     Plecanatide  (TRULANCE ) 3 MG TABS Take 1 tablet (3 mg total) by mouth daily. 30 tablet 5  prednisoLONE acetate (PRED FORTE) 1 % ophthalmic suspension INSTILL 1 DROP INTO RIGHT EYE FOUR TIMES DAILY FOR 1 WEEK THEN THREE TIMES DAILY FOR 1 WEEK THEN TWICE DAILY FOR 1 WEEK THEN EVERY DAY     predniSONE  (DELTASONE ) 5 MG tablet Take 4 tabs po x 4 days, 3  tabs po x 4 days, 2  tabs po x 4 days, 1  tab po x 4 days 40 tablet 0   sildenafil (REVATIO) 20 MG tablet Take 40-60 mg by mouth daily as needed for erectile dysfunction.     SPIRIVA  RESPIMAT 2.5 MCG/ACT AERS INHALE 2 PUFFS INTO THE LUNGS DAILY 4 g 11   SYMBICORT  160-4.5 MCG/ACT inhaler INHALE 2 PUFFS FIRST THING IN THE MORNING AND THEN 2 PUFFS 12 HOURS LATER 10.2 g 5   XELJANZ  5 MG TABS Take 1 tablet (5 mg total) by mouth daily. 90 tablet 0   No current facility-administered medications for this visit.    REVIEW OF SYSTEMS (negative unless checked):   Cardiac:   []  Chest pain or chest pressure? []  Shortness of breath upon activity? []  Shortness of breath when lying flat? []  Irregular heart rhythm?  Vascular:  []  Pain in calf, thigh, or hip brought on by walking? []  Pain in feet at night that wakes you up from your sleep? []  Blood clot in your veins? []  Leg swelling?  Pulmonary:  []  Oxygen at home? []  Productive cough? []  Wheezing?  Neurologic:  []  Sudden weakness in arms or legs? []  Sudden numbness in arms or legs? []  Sudden onset of difficult speaking or slurred speech? []  Temporary loss of vision in one eye? []  Problems with dizziness?  Gastrointestinal:  []  Blood in stool? []  Vomited blood?  Genitourinary:  []  Burning when urinating? []  Blood in urine?  Psychiatric:  []  Major depression  Hematologic:  []  Bleeding problems? []  Problems with blood clotting?  Dermatologic:  []  Rashes or ulcers?  Constitutional:  []  Fever or chills?  Ear/Nose/Throat:  []  Change in hearing? []  Nose bleeds? []  Sore throat?  Musculoskeletal:  []  Back pain? [x]  Joint pain? []  Muscle pain?   Physical Examination   Vitals:   07/21/23 0953  BP: 125/79  Pulse: (!) 108  Temp: 97.6 F (36.4 C)  TempSrc: Temporal  SpO2: 94%  Weight: 163 lb 8 oz (74.2 kg)  Height: 5' 9 (1.753 m)   Body mass index is 24.14 kg/m.  General:  WDWN in NAD; vital signs documented above Gait: Not observed HENT: WNL, normocephalic Pulmonary: normal non-labored breathing , without rales, rhonchi,  wheezing Cardiac: Regular Abdomen: soft, NT, no masses Skin: without rashes Vascular Exam/Pulses: Nonpalpable pedal pulses.  Brisk bilateral PT/peroneal Doppler signals Extremities: without ischemic changes, without gangrene , without cellulitis; without open wounds;  Musculoskeletal: no muscle wasting or atrophy  Neurologic: A&O X 3;  No focal weakness or paresthesias are detected Psychiatric:  The pt has Normal affect.  Non-Invasive Vascular  Imaging ABI (07/21/2023) R:  ABI: [place] ([place]),  PT: bi DP: tri TBI: 0.36 L:  ABI: [place] ([place]),  PT: bi DP: mono TBI: 0.32   LLE bypass Duplex (07/21/2023) Patent left femoropopliteal bypass graft without hemodynamically significant stenosis.  About 50% stenosis at the midportion of the graft, with a PSV of 179 cm/s.  This was visualized by CT back in March 2025.   Medical Decision Making   Seaton Hofmann is a 73 y.o. male who presents for surveillance of PAD  Based on the patient's vascular  studies, his ABIs remain noncompressible bilaterally.  His toe pressures bilaterally are essentially unchanged. Left lower extremity bypass graft duplex demonstrates a patent femoropopliteal bypass without hemodynamically significant stenosis.  There appears to be unchanged 50% stenosis at the midportion of the graft, which was seen on CT back in March. He denies any rest pain, claudication, or tissue loss.  On exam he has brisk PT/peroneal Doppler signals bilaterally He endorses continued left groin pain for several months now.  His pain is triggered by going up stairs or trying to lift heavy objects.  I do not see any cause for his groin pain from a vascular perspective.  Potentially this is related to his left hip arthritis. He can keep his follow-up with our office in 1 month with repeat noninvasive studies, including right aortoiliac duplex   Ahmed Holster PA-C [facility] Office: [phone]  Clinic MD: [place]

## 2023-07-23 ENCOUNTER — Telehealth: Payer: Self-pay | Admitting: Acute Care

## 2023-07-23 DIAGNOSIS — Z87891 Personal history of nicotine dependence: Secondary | ICD-10-CM

## 2023-07-23 DIAGNOSIS — R911 Solitary pulmonary nodule: Secondary | ICD-10-CM

## 2023-07-23 NOTE — Telephone Encounter (Signed)
 Left VM to discuss results of recent LDCT.

## 2023-07-24 ENCOUNTER — Other Ambulatory Visit: Payer: Self-pay | Admitting: Physician Assistant

## 2023-07-24 ENCOUNTER — Ambulatory Visit: Admitting: Physician Assistant

## 2023-07-24 ENCOUNTER — Other Ambulatory Visit: Payer: Self-pay

## 2023-07-24 DIAGNOSIS — M0609 Rheumatoid arthritis without rheumatoid factor, multiple sites: Secondary | ICD-10-CM

## 2023-07-24 DIAGNOSIS — Z79899 Other long term (current) drug therapy: Secondary | ICD-10-CM

## 2023-07-25 ENCOUNTER — Other Ambulatory Visit: Payer: Self-pay

## 2023-07-25 ENCOUNTER — Ambulatory Visit (HOSPITAL_COMMUNITY)
Admission: RE | Admit: 2023-07-25 | Discharge: 2023-07-25 | Disposition: A | Discharge status: Home / Self Care | Source: Ambulatory Visit | Attending: Gastroenterology | Admitting: Gastroenterology

## 2023-07-25 DIAGNOSIS — D490 Neoplasm of unspecified behavior of digestive system: Secondary | ICD-10-CM | POA: Diagnosis present

## 2023-07-25 MED ORDER — XELJANZ 5 MG PO TABS
5.0000 mg | ORAL_TABLET | Freq: Every day | ORAL | 0 refills | Status: DC
  Filled 2023-07-25 – 2023-07-29 (×3): qty 60, 60d supply, fill #0

## 2023-07-25 MED ORDER — IOHEXOL 9 MG/ML PO SOLN
500.0000 mL | ORAL | Status: AC
  Administered 2023-07-25: 500 mL via ORAL

## 2023-07-25 MED ORDER — IOHEXOL 9 MG/ML PO SOLN
ORAL | Status: AC
  Filled 2023-07-25: qty 1000

## 2023-07-25 MED ORDER — IOHEXOL 300 MG/ML  SOLN
100.0000 mL | Freq: Once | INTRAMUSCULAR | Status: AC | PRN
Start: 2023-07-25 — End: 2023-07-25
  Administered 2023-07-25: 100 mL via INTRAVENOUS

## 2023-07-25 NOTE — Telephone Encounter (Signed)
 Letter to [facility] 07/25/23

## 2023-07-25 NOTE — Addendum Note (Signed)
 Addended by: DAYNE SHERRY RAMAN on: 07/25/2023 08:03 AM   Modules accepted: Orders

## 2023-07-25 NOTE — Telephone Encounter (Signed)
 Received message from [place] at [facility]:  [facility], sorry to bother you, I need your help with one.  We requested a refill for this pt's Xeljanz  and Dr. Jammie office denied saying it was too soon; however it is only available as a #60 count (60 days supply for this pt), but the script only has a #30 count remaining and we cannot break the bottles.  Can you assist us  with this one?  Refill for Xeljanz  sent to [facility]  Sherry Pennant, PharmD, MPH, BCPS, CPP Clinical Pharmacist (Rheumatology and Pulmonology)

## 2023-07-28 ENCOUNTER — Telehealth: Payer: Self-pay | Admitting: Cardiology

## 2023-07-28 ENCOUNTER — Ambulatory Visit (INDEPENDENT_AMBULATORY_CARE_PROVIDER_SITE_OTHER): Admitting: Physician Assistant

## 2023-07-28 ENCOUNTER — Encounter: Payer: Self-pay | Admitting: Physician Assistant

## 2023-07-28 VITALS — BP 98/60

## 2023-07-28 DIAGNOSIS — L819 Disorder of pigmentation, unspecified: Secondary | ICD-10-CM

## 2023-07-28 MED ORDER — HYDROCORTISONE 2.5 % EX LOTN: TOPICAL_LOTION | Freq: Every day | CUTANEOUS | 11 refills | Status: DC

## 2023-07-28 NOTE — Telephone Encounter (Signed)
 Pt came into [place] office and stated that a Doctor in [place] and Dr.Vyas told him his blood pressure is low and he needs to talk to his cardiologist. Last reading was 80/60 and with Dr.Vyas 100/70.

## 2023-07-28 NOTE — Addendum Note (Signed)
 Addended by: ANITRA AQUAS D on: 07/28/2023 02:56 PM   Modules accepted: Orders

## 2023-07-28 NOTE — Telephone Encounter (Signed)
 I relayed Np message to patient and he reports he has already had 3 bottles of water  today and will got to the ED if he has any syncope,dizziness,etc.

## 2023-07-28 NOTE — Progress Notes (Signed)
   New Patient Visit   Subjective  [name] is a 73 y.o. male who presents for the following: He has lighter spots coming up around his lips x 8-9 months. He thinks it may be related to using [employer] dye on his moustache and beard as it often times stings and burns when he puts it on.     The following portions of the chart were reviewed this encounter and updated as appropriate: medications, allergies, medical history  Review of Systems:  No other skin or systemic complaints except as noted in HPI or Assessment and Plan.  Objective  Well appearing patient in no apparent distress; mood and affect are within normal limits.   A focused examination was performed of the following areas: Face, axilla, arms, hands, ankles and lower legs.    Relevant exam findings are noted in the Assessment and Plan.     Assessment & Plan   HYPOPIGMENTATION Exam: Hypopigmented patches of perioral area. No evidence of vitiligo today.  Treatment Plan: Recommend he discontinue hair dye.  Start hydrocortisone  2.5% lotion daily for 1 month then decrease to once a day Monday, Wednesday, Friday   HYPOPIGMENTATION    Return in about 5 months (around 12/28/2023) for hypopigmentation.  I, Roseline Hutchinson, CMA, am acting as scribe for Oanh Devivo K, PA-C .   Documentation: I have reviewed the above documentation for accuracy and completeness, and I agree with the above.  Derrell Milanes K, PA-C

## 2023-07-28 NOTE — Telephone Encounter (Signed)
 I spoke with patient and he reports at dermatology appointment today he was told his BP was 80/60,it was not repeated. Last week at PCP, he was told his BP was 100/70  He said he feels a little sluggish but otherwise feels fine. He admits to drinking only one glass of water  a day. I encouraged him top drink more water  to stay hydrated in this heat. He agrees.  I have also asked him to keep a daily BP log and drop back off for review next week so we can review his trends.   I will FYI E.Peck,NP

## 2023-07-28 NOTE — Patient Instructions (Signed)

## 2023-07-28 NOTE — Telephone Encounter (Signed)
 Spoke with patient and reviewed lung screening CT results. 2 new small nodules noted. 6 month repeat CT is recommended. Pt verbalized understanding and is in agreement to schedule the 6 month f/u CT closer to the due date. Results/ plans faxed to PCP.

## 2023-07-29 ENCOUNTER — Other Ambulatory Visit: Payer: Self-pay

## 2023-07-29 ENCOUNTER — Other Ambulatory Visit (HOSPITAL_COMMUNITY): Payer: Self-pay

## 2023-07-29 NOTE — Progress Notes (Signed)
 Specialty Pharmacy Refill Coordination Note  Araf Clugston is a 73 y.o. male contacted today regarding refills of specialty medication(s) Tofacitinib  Citrate (Xeljanz )   Patient requested Delivery   Delivery date: 07/31/23   Verified address: [address]   [place] [address]   Medication will be filled on 07/30/23.

## 2023-08-03 ENCOUNTER — Ambulatory Visit: Payer: Self-pay | Admitting: Gastroenterology

## 2023-08-05 ENCOUNTER — Telehealth: Payer: Self-pay | Admitting: Rheumatology

## 2023-08-05 DIAGNOSIS — M1612 Unilateral primary osteoarthritis, left hip: Secondary | ICD-10-CM

## 2023-08-05 DIAGNOSIS — M25552 Pain in left hip: Secondary | ICD-10-CM

## 2023-08-05 NOTE — Telephone Encounter (Signed)
 Pt called stating his left hip is in pain and is wanting to get a shot. Pt stated he talked to [facility] at his last appt about getting this shot.

## 2023-08-05 NOTE — Telephone Encounter (Signed)
 Patient has moderate to severe arthritis involving the left hip noted on recent x-rays.  Recommended evaluation by orthopedics to determine if he would be a good candidate for cortisone injection versus surgical intervention.  Please clarify if he would like a referral to orthopedics.  We did not perform intra-articular hip injections in our office.

## 2023-08-06 NOTE — Telephone Encounter (Signed)
 Attempted to contact the patient and left a message to call the office back.

## 2023-08-07 NOTE — Addendum Note (Signed)
 Addended by: Iwalani Templeton P on: 08/07/2023 11:05 AM   Modules accepted: Orders

## 2023-08-07 NOTE — Addendum Note (Signed)
 Addended by: CHERYL WADDELL HERO on: 08/07/2023 11:27 AM   Modules accepted: Orders

## 2023-08-07 NOTE — Telephone Encounter (Signed)
 Patient advised Patient has moderate to severe arthritis involving the left hip noted on recent x-rays. Recommended evaluation by orthopedics to determine if he would be a good candidate for cortisone injection versus surgical intervention. Please clarify if he would like a referral to orthopedics. We did not perform intra-articular hip injections in our office.   Patient states he will need a referral to Orthopedics. Please review and sign.

## 2023-08-11 ENCOUNTER — Other Ambulatory Visit: Payer: Self-pay | Admitting: *Deleted

## 2023-08-11 DIAGNOSIS — Z95828 Presence of other vascular implants and grafts: Secondary | ICD-10-CM

## 2023-08-11 DIAGNOSIS — I739 Peripheral vascular disease, unspecified: Secondary | ICD-10-CM

## 2023-08-14 ENCOUNTER — Ambulatory Visit: Admitting: Orthopaedic Surgery

## 2023-08-14 ENCOUNTER — Encounter: Payer: Self-pay | Admitting: Nurse Practitioner

## 2023-08-14 ENCOUNTER — Encounter: Payer: Self-pay | Admitting: Orthopaedic Surgery

## 2023-08-14 DIAGNOSIS — M05752 Rheumatoid arthritis with rheumatoid factor of left hip without organ or systems involvement: Secondary | ICD-10-CM | POA: Diagnosis not present

## 2023-08-14 DIAGNOSIS — M05751 Rheumatoid arthritis with rheumatoid factor of right hip without organ or systems involvement: Secondary | ICD-10-CM

## 2023-08-14 NOTE — Progress Notes (Signed)
 Subjective:    Patient ID: [name], male    DOB: 20-Sep-1950, 73 y.o.   MRN: [id number]  HPI He has a long history of rheumatoid arthritis of multiple joints, knees, wrists, hands and more particularly the hips, left hip the most.  He has been seen by rheumatology and they have asked that he be seen for his left hip as it has gotten worse.  He has bad days when he can hardly stand because of the left hip pain.  He has a cane that he uses.  The bad days last two to three days at a time.  He is doing well today.  His last bad day was about three weeks ago.  I have reviewed his X-rays and the notes from rheumatology.  I have independently reviewed and interpreted x-rays of this patient done at another site by another physician or qualified health professional.    Review of Systems  Constitutional:  Positive for activity change.  Respiratory:  Positive for shortness of breath.   Musculoskeletal:  Positive for arthralgias, gait problem, joint swelling and myalgias.  All other systems reviewed and are negative. For Review of Systems, all other systems reviewed and are negative.  The following is a summary of the past history medically, past history surgically, known current medicines, social history and family history.  This information is gathered electronically by the computer from prior information and documentation.  I review this each visit and have found including this information at this point in the chart is beneficial and informative.   Past Medical History:  Diagnosis Date   Anemia    Bronchitis    Carotid artery disease (HCC)    Cervical spine disease    Neck surgery in 2007 with Dr. Leeann   Collagen vascular disease [facility])    COPD (chronic obstructive pulmonary disease) (HCC)    Coronary atherosclerosis of native coronary artery    Nonobstrtuctive by catheterization 2007, 2013, 2021   DVT (deep venous thrombosis) (HCC)    Essential hypertension    GERD  (gastroesophageal reflux disease)    Hyperlipidemia    PAD (peripheral artery disease) (HCC)    s/p left fem-pop bypass 2007; PTA and stenting of right iliac artery 09/2011   Rheumatoid arthritis(714.0)    Type 2 diabetes mellitus (HCC)     Past Surgical History:  Procedure Laterality Date   ABDOMINAL AORTAGRAM N/A 10/03/2011   Procedure: ABDOMINAL EZELLA;  Surgeon: Redell LITTIE Door, MD;  Location: [facility] CATH LAB;  Service: Cardiovascular;  Laterality: N/A;   ABDOMINAL AORTOGRAM W/LOWER EXTREMITY N/A 01/16/2017   Procedure: ABDOMINAL AORTOGRAM W/LOWER EXTREMITY;  Surgeon: Door Redell LITTIE, MD;  Location: [facility] INVASIVE CV LAB;  Service: Cardiovascular;  Laterality: N/A;   ABDOMINAL AORTOGRAM W/LOWER EXTREMITY Bilateral 08/04/2019   Procedure: ABDOMINAL AORTOGRAM W/LOWER EXTREMITY;  Surgeon: Gretta Lonni PARAS, MD;  Location: [facility] INVASIVE CV LAB;  Service: Cardiovascular;  Laterality: Bilateral;   ABDOMINAL AORTOGRAM W/LOWER EXTREMITY N/A 03/09/2020   Procedure: ABDOMINAL AORTOGRAM W/LOWER EXTREMITY;  Surgeon: Gretta Lonni PARAS, MD;  Location: [facility] INVASIVE CV LAB;  Service: Cardiovascular;  Laterality: N/A;   ABDOMINAL AORTOGRAM W/LOWER EXTREMITY N/A 12/02/2022   Procedure: ABDOMINAL AORTOGRAM W/LOWER EXTREMITY;  Surgeon: Pearline Norman RAMAN, MD;  Location: [facility] INVASIVE CV LAB;  Service: Cardiovascular;  Laterality: N/A;   BIOPSY N/A 12/28/2021   Procedure: BIOPSY;  Surgeon: Cindie Carlin POUR, DO;  Location: [facility] ENDO SUITE;  Service: Endoscopy;  Laterality: N/A;   CATARACT EXTRACTION W/PHACO Right  04/18/2023   Procedure: PHACOEMULSIFICATION, CATARACT, WITH IOL INSERTION;  Surgeon: Juli Blunt, MD;  Location: [facility] ORS;  Service: Ophthalmology;  Laterality: Right;  CDE: 1.81   CATARACT EXTRACTION W/PHACO Left 05/02/2023   Procedure: PHACOEMULSIFICATION, CATARACT, WITH IOL INSERTION;  Surgeon: Juli Blunt, MD;  Location: [facility] ORS;  Service: Ophthalmology;  Laterality: Left;  CDE: 1.17   CERVICAL DISC  SURGERY     COLONOSCOPY  2013   Hyperplastic polyp   COLONOSCOPY WITH PROPOFOL  N/A 02/17/2017   Surgeon: Shaaron Lamar HERO, MD; Many small and large mouth diverticula in the sigmoid, descending, transverse colon, internal hemorrhoids that were medium sized and grade 1.   COLONOSCOPY WITH PROPOFOL  N/A 12/28/2021   Surgeon: Cindie Carlin POUR, DO;  Diverticulosis in the sigmoid colon and descending colon.  Likely benign tumor in the ascending colon.   ESOPHAGOGASTRODUODENOSCOPY (EGD) WITH PROPOFOL  N/A 12/28/2021   Procedure: ESOPHAGOGASTRODUODENOSCOPY (EGD) WITH PROPOFOL ;  Surgeon: Cindie Carlin POUR, DO;  Location: [facility];  Service: Endoscopy;  Laterality: N/A;   FEMORAL-POPLITEAL BYPASS GRAFT  11/06/2005   ILIAC ARTERY STENT  10/03/2011   Right CIA stenting   INSERTION, STENT, DRUG-ELUTING, LACRIMAL CANALICULUS Right 04/18/2023   Procedure: INSERTION, STENT, DRUG-ELUTING, LACRIMAL CANALICULUS;  Surgeon: Juli Blunt, MD;  Location: [facility] ORS;  Service: Ophthalmology;  Laterality: Right;   INSERTION, STENT, DRUG-ELUTING, LACRIMAL CANALICULUS Left 05/02/2023   Procedure: INSERTION, STENT, DRUG-ELUTING, LACRIMAL CANALICULUS;  Surgeon: Juli Blunt, MD;  Location: [facility] ORS;  Service: Ophthalmology;  Laterality: Left;   LEFT HEART CATH AND CORONARY ANGIOGRAPHY N/A 04/14/2019   Procedure: LEFT HEART CATH AND CORONARY ANGIOGRAPHY;  Surgeon: Burnard Debby LABOR, MD;  Location: [facility] INVASIVE CV LAB;  Service: Cardiovascular;  Laterality: N/A;   PERIPHERAL VASCULAR BALLOON ANGIOPLASTY Right 01/16/2017   Procedure: PERIPHERAL VASCULAR BALLOON ANGIOPLASTY;  Surgeon: Laurence Redell CROME, MD;  Location: [facility] INVASIVE CV LAB;  Service: Cardiovascular;  Laterality: Right;  common iliac    PERIPHERAL VASCULAR BALLOON ANGIOPLASTY  03/09/2020   Procedure: PERIPHERAL VASCULAR BALLOON ANGIOPLASTY;  Surgeon: Gretta Lonni PARAS, MD;  Location: [facility] INVASIVE CV LAB;  Service: Cardiovascular;;   PR VEIN BYPASS  GRAFT,AORTO-FEM-POP  06/22/2010   Redo Left Fem-Pop   Repair of left arm fracture     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 8 g 2   atorvastatin  (LIPITOR) 80 MG tablet TAKE 1 TABLET(80 MG) BY MOUTH DAILY 90 tablet 2   cilostazol  (PLETAL ) 100 MG tablet TAKE 1 TABLET(100 MG) BY MOUTH TWICE DAILY 180 tablet 3   ciprofloxacin (CILOXAN) 0.3 % ophthalmic solution Place 1 drop into the right eye 4 (four) times daily.     clopidogrel  (PLAVIX ) 75 MG tablet Take 1 tablet (75 mg total) by mouth daily. 30 tablet 11   dexlansoprazole  (DEXILANT ) 60 MG capsule Take 1 capsule (60 mg total) by mouth daily. 30 capsule 11   diclofenac  Sodium (VOLTAREN ) 1 % GEL Apply 2 gram  to painful foot and ankle joint twice daily. 150 g 1   empagliflozin (JARDIANCE) 25 MG TABS tablet Take 25 mg by mouth in the morning.     glipiZIDE (GLUCOTROL) 5 MG tablet Take 5-10 mg by mouth See admin instructions.  HYDROcodone-acetaminophen  (NORCO) 7.5-325 MG per tablet Take 1 tablet by mouth 2 (two) times daily as needed (pain.).     hydrocortisone  2.5 % lotion Apply topically daily. Apply to affected area of face daily x 1 month then decrease to once a day Monday, Wednesday, Friday 59 mL 11   JANUVIA 100 MG tablet Take 100 mg by mouth in the morning.     ketoconazole  (NIZORAL ) 2 % cream Apply to both feet and between toes once daily for 6 weeks. 60 g 1   ketorolac  (ACULAR ) 0.5 % ophthalmic solution Place 1 drop into the right eye 4 (four) times daily.     LANTUS SOLOSTAR 100 UNIT/ML Solostar Pen Inject 10 Units into the skin at bedtime.     metoprolol  succinate (TOPROL  XL) 25 MG 24 hr tablet Take 0.5 tablets (12.5 mg total) by mouth daily.     metoprolol  tartrate  (LOPRESSOR ) 100 MG tablet Take 1 (100 Mg) tablet 2 hours before your procedure 1 tablet 0   nitroGLYCERIN  (NITROSTAT ) 0.4 MG SL tablet Place 1 tablet (0.4 mg total) under the tongue every 5 (five) minutes x 3 doses as needed for chest pain (If no relief after 3rd dose, GO TO ER.). 25 tablet 3   Omega-3 Fatty Acids (OMEGA-3 FISH OIL PO) Take 1 capsule by mouth daily.     ONE TOUCH ULTRA TEST test strip      OneTouch Delica Lancets 33G MISC Apply topically.     Plecanatide  (TRULANCE ) 3 MG TABS Take 1 tablet (3 mg total) by mouth daily. 30 tablet 5   prednisoLONE acetate (PRED FORTE) 1 % ophthalmic suspension INSTILL 1 DROP INTO RIGHT EYE FOUR TIMES DAILY FOR 1 WEEK THEN THREE TIMES DAILY FOR 1 WEEK THEN TWICE DAILY FOR 1 WEEK THEN EVERY DAY     predniSONE  (DELTASONE ) 5 MG tablet Take 4 tabs po x 4 days, 3  tabs po x 4 days, 2  tabs po x 4 days, 1  tab po x 4 days 40 tablet 0   sildenafil (REVATIO) 20 MG tablet Take 40-60 mg by mouth daily as needed for erectile dysfunction.     SPIRIVA  RESPIMAT 2.5 MCG/ACT AERS INHALE 2 PUFFS INTO THE LUNGS DAILY 4 g 11   SYMBICORT  160-4.5 MCG/ACT inhaler INHALE 2 PUFFS FIRST THING IN THE MORNING AND THEN 2 PUFFS 12 HOURS LATER 10.2 g 5   XELJANZ  5 MG TABS Take 1 tablet (5 mg total) by mouth daily. 60 tablet 0   No current facility-administered medications on file prior to visit.    Social History   Socioeconomic History   Marital status: Single    Spouse name: Not on file   Number of children: Not on file   Years of education: Not on file   Highest education level: Not on file  Occupational History   Not on file  Tobacco Use   Smoking status: Former    Current packs/day: 0.00    Average packs/day: 1.5 packs/day for 45.0 years (67.5 ttl pk-yrs)    Types: Cigarettes, Cigars    Start date: 09/01/1966    Quit date: 09/01/2011    Years since quitting: 11.9    Passive exposure: Never   Smokeless tobacco: Never  Vaping Use   Vaping status: Never Used   Substance and Sexual Activity   Alcohol use: Not Currently   Drug use: No   Sexual activity: Not Currently    Birth control/protection: None  Other Topics Concern  Not on file  Social History Narrative   The patient lives in [place].    Social Drivers of Corporate investment banker Strain: Not on file  Food Insecurity: Not on file  Transportation Needs: Not on file  Physical Activity: Not on file  Stress: Not on file  Social Connections: Not on file  Intimate Partner Violence: Not on file    Family History  Problem Relation Age of Onset   Heart attack Mother 57   Deep vein thrombosis Mother    Diabetes Mother    Hyperlipidemia Mother    Hypertension Mother    Peripheral vascular disease Mother    Stroke Father    Diabetes Father    Hyperlipidemia Father    Hypertension Father    Peripheral vascular disease Father    Diabetes Sister    Hyperlipidemia Sister    Hypertension Sister    Diabetes Brother    Hyperlipidemia Brother    Cancer Brother    Deep vein thrombosis Brother    Hypertension Brother    Peripheral vascular disease Brother    Diabetes Daughter    Hyperlipidemia Son    Cancer Son    Hypertension Son    Colon cancer Neg Hx     There were no vitals taken for this visit.  There is no height or weight on file to calculate BMI.      Objective:   Physical Exam Vitals and nursing note reviewed. Exam conducted with a chaperone present.  Constitutional:      Appearance: He is well-developed.  HENT:     Head: Normocephalic and atraumatic.  Eyes:     Conjunctiva/sclera: Conjunctivae normal.     Pupils: Pupils are equal, round, and reactive to light.  Cardiovascular:     Rate and Rhythm: Normal rate and regular rhythm.  Pulmonary:     Effort: Pulmonary effort is normal.  Abdominal:     Palpations: Abdomen is soft.  Musculoskeletal:     Cervical back: Normal range of motion and neck supple.       Legs:  Skin:    General: Skin is warm and dry.   Neurological:     Mental Status: He is alert and oriented to person, place, and time.     Cranial Nerves: No cranial nerve deficit.     Motor: No abnormal muscle tone.     Coordination: Coordination normal.     Deep Tendon Reflexes: Reflexes are normal and symmetric. Reflexes normal.  Psychiatric:        Behavior: Behavior normal.        Thought Content: Thought content normal.        Judgment: Judgment normal.           Assessment & Plan:   Encounter Diagnosis  Name Primary?   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 (Good 30 minutes).  I have explained he is a candidate for a total hip and possibly other joints.    He does not want to consider any surgery at this point.  He says he would want to wait until his bad days outnumbered his good days.  The surgery is elective and he will let us  know when he would like to consider surgery.  He is active and does a lot of other activities such as fishing.  I will see him  prn.  Call if any problem.  Precautions discussed.  Electronically Signed Lemond Stable, MD 8/7/20259:42 AM

## 2023-08-19 NOTE — Progress Notes (Signed)
 Office Visit Note  Patient: [name]             Date of Birth: 04-20-1950           MRN: [id number]             PCP: [name], MD Referring: [name], MD Visit Date: 09/02/2023 Occupation: @GUAROCC @  Subjective:  Pain in multiple joints   History of Present Illness: [name] is a 73 y.o. male with history of rheumatoid arthritis.  Patient remains on xeljanz  5 mg 1 tablet by mouth daily.  He denies any recent gaps in therapy.  Patient states that he had bilateral knee joint cortisone injections performed on 08/21/2023 which have provided some relief.  He continues to have significant discomfort in the left hip and states that he needs a hip replacement but has not yet scheduled the procedure.  Patient states he continues to have intermittent flares.  He has intermittent swelling in the left wrist and left foot.  He has also had intermittent discomfort in both shoulders, left greater than right.  Activities of Daily Living:  Patient reports morning stiffness for 5-10 minutes.   Patient Reports nocturnal pain.  Difficulty dressing/grooming: Denies Difficulty climbing stairs: Reports Difficulty getting out of chair: Reports Difficulty using hands for taps, buttons, cutlery, and/or writing: Reports  Review of Systems  Constitutional:  Positive for fatigue.  HENT:  Positive for mouth dryness. Negative for mouth sores.   Eyes:  Positive for dryness.  Gastrointestinal:  Positive for constipation. Negative for blood in stool and diarrhea.  Endocrine: Positive for increased urination.  Genitourinary:  Positive for involuntary urination.  Musculoskeletal:  Positive for joint pain, gait problem, joint pain, joint swelling, myalgias, muscle weakness, morning stiffness, muscle tenderness and myalgias.  Skin:  Negative for color change, rash, hair loss and sensitivity to sunlight.  Allergic/Immunologic: Negative for susceptible to infections.  Neurological:  Positive for  dizziness and headaches.  Hematological:  Negative for swollen glands.  Psychiatric/Behavioral:  Positive for sleep disturbance. Negative for depressed mood. The patient is nervous/anxious.     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   Opioid dependence (HCC) 10/15/2018   Other long term (current) drug therapy 10/15/2018   Diabetes mellitus type 2, controlled, without complications (HCC) 10/15/2018   Puncture wound of finger of left hand 08/08/2018   Puncture wound of finger, right, complicated, initial encounter 08/05/2018   DOE (dyspnea on exertion) 11/04/2017   Muscle cramps 07/04/2017   Contracture of left elbow 07/04/2017   High risk medication use 06/23/2017   Primary osteoarthritis of both hands 06/23/2017   Primary osteoarthritis of both feet 06/23/2017   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 factor (HCC) 06/23/2017   DDD (degenerative disc disease), cervical 06/23/2017   Rotator cuff syndrome of right shoulder 06/06/2017   History of  bilateral carpal tunnel release 06/06/2017   Ganglion cyst of wrist, left 06/06/2017  Non-insulin dependent type 2 diabetes mellitus (HCC) 06/06/2017   Chest pain with high risk for cardiac etiology 04/05/2017   COPD with acute exacerbation (HCC) 03/13/2017   Plantar fasciitis 02/21/2017   Iliac artery stenosis, right (HCC) 02/12/2017   Black stool  02/10/2017   Abnormal CT of the abdomen 02/10/2017   Cervical spondylosis 10/11/2016   Bony growth 10/13/2015   Pain in joint, lower leg 03/02/2014   Discoloration of skin-Left dorsum foot 09/28/2013   Swelling of limb-Left Calf / Leg 09/28/2013   PVD (peripheral vascular disease) (HCC) 10/14/2012   Pain in limb- Left popliteal and calf 10/14/2012   Dyslipidemia, goal LDL below 70    Essential hypertension 03/05/2012   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 Leeann   Collagen vascular disease [facility])    COPD (chronic obstructive pulmonary disease) (HCC)    Coronary atherosclerosis of native coronary artery    Nonobstrtuctive by catheterization 2007, 2013, 2021   DVT (deep venous thrombosis) (HCC)    Essential hypertension    GERD (gastroesophageal reflux disease)    Hyperlipidemia    PAD (peripheral artery disease) (HCC)    s/p left fem-pop bypass 2007; PTA and stenting of right iliac artery 09/2011   Rheumatoid arthritis(714.0)    Type 2 diabetes mellitus (HCC)     Family History  Problem Relation Age of Onset   Heart attack Mother 48   Deep vein thrombosis Mother    Diabetes Mother    Hyperlipidemia Mother    Hypertension Mother    Peripheral vascular disease Mother    Stroke Father    Diabetes Father    Hyperlipidemia Father    Hypertension Father    Peripheral vascular disease Father     Diabetes Sister    Hyperlipidemia Sister    Hypertension Sister    Diabetes Brother    Hyperlipidemia Brother    Cancer Brother    Deep vein thrombosis Brother    Hypertension Brother    Peripheral vascular disease Brother    Diabetes Daughter    Hyperlipidemia Son    Cancer Son    Hypertension Son    Colon cancer Neg Hx    Past Surgical History:  Procedure Laterality Date   ABDOMINAL AORTAGRAM N/A 10/03/2011   Procedure: ABDOMINAL EZELLA;  Surgeon: Redell LITTIE Door, MD;  Location: [facility] CATH LAB;  Service: Cardiovascular;  Laterality: N/A;   ABDOMINAL AORTOGRAM W/LOWER EXTREMITY N/A 01/16/2017   Procedure: ABDOMINAL AORTOGRAM W/LOWER EXTREMITY;  Surgeon: Door Redell LITTIE, MD;  Location: [facility] INVASIVE CV LAB;  Service: Cardiovascular;  Laterality: N/A;   ABDOMINAL AORTOGRAM W/LOWER EXTREMITY Bilateral 08/04/2019   Procedure: ABDOMINAL AORTOGRAM W/LOWER EXTREMITY;  Surgeon: Gretta Lonni PARAS, MD;  Location: [facility] INVASIVE CV LAB;  Service: Cardiovascular;  Laterality: Bilateral;   ABDOMINAL AORTOGRAM W/LOWER EXTREMITY N/A 03/09/2020   Procedure: ABDOMINAL AORTOGRAM W/LOWER EXTREMITY;  Surgeon: Gretta Lonni PARAS, MD;  Location:  [facility] INVASIVE CV LAB;  Service: Cardiovascular;  Laterality: N/A;   ABDOMINAL AORTOGRAM W/LOWER EXTREMITY N/A 12/02/2022   Procedure: ABDOMINAL AORTOGRAM W/LOWER EXTREMITY;  Surgeon: Pearline Norman RAMAN, MD;  [facility]VAS[facility] CV LAB;  Service: Cardiovascular;  Laterality: N/A;   BIOPSY N/A 12/28/2021   Procedure: BIOPSY;  Surgeon: Cindie Carlin POUR, DO;  Location: [facility] ENDO SUITE;  Service: Endoscopy;  Laterality: N/A;   CATARACT EXTRACTION W/PHACO Right 04/18/2023   Procedure: PHACOEMULSIFICATION, CATARACT, WITH IOL INSERTION;  Surgeon: Juli Blunt, MD;  Location: [facility] ORS;  Service: Ophthalmology;  Laterality: Right;  CDE: 1.81   CATARACT EXTRACTION W/PHACO Left 05/02/2023   Procedure: PHACOEMULSIFICATION, CATARACT, WITH IOL INSERTION;  Surgeon: Juli Blunt,  MD;  Location: [facility] ORS;  Service: Ophthalmology;  Laterality: Left;  CDE: 1.17   CERVICAL DISC SURGERY     COLONOSCOPY  2013   Hyperplastic polyp   COLONOSCOPY WITH PROPOFOL  N/A 02/17/2017   Surgeon: Shaaron Lamar HERO, MD; Many small and large mouth diverticula in the sigmoid, descending, transverse colon, internal hemorrhoids that were medium sized and grade 1.   COLONOSCOPY WITH PROPOFOL  N/A 12/28/2021   Surgeon: Cindie Carlin POUR, DO;  Diverticulosis in the sigmoid colon and descending colon.  Likely benign tumor in the ascending colon.   ESOPHAGOGASTRODUODENOSCOPY (EGD) WITH PROPOFOL  N/A 12/28/2021   Procedure: ESOPHAGOGASTRODUODENOSCOPY (EGD) WITH PROPOFOL ;  Surgeon: Cindie Carlin POUR, DO;  Location: [facility];  Service: Endoscopy;  Laterality: N/A;   FEMORAL-POPLITEAL BYPASS GRAFT  11/06/2005   ILIAC ARTERY STENT  10/03/2011   Right CIA stenting   INSERTION, STENT, DRUG-ELUTING, LACRIMAL CANALICULUS Right 04/18/2023   Procedure: INSERTION, STENT, DRUG-ELUTING, LACRIMAL CANALICULUS;  Surgeon: Juli Blunt, MD;  Location: [facility];  Service: Ophthalmology;  Laterality: Right;   INSERTION, STENT, DRUG-ELUTING, LACRIMAL CANALICULUS Left 05/02/2023   Procedure: INSERTION, STENT, DRUG-ELUTING, LACRIMAL CANALICULUS;  Surgeon: Juli Blunt, MD;  Location: [facility];  Service: Ophthalmology;  Laterality: Left;   LEFT HEART CATH AND CORONARY ANGIOGRAPHY N/A 04/14/2019   Procedure: LEFT HEART CATH AND CORONARY ANGIOGRAPHY;  Surgeon: Burnard Debby LABOR, MD;  Location: [facility] INVASIVE CV LAB;  Service: Cardiovascular;  Laterality: N/A;   PERIPHERAL VASCULAR BALLOON ANGIOPLASTY Right 01/16/2017   Procedure: PERIPHERAL VASCULAR BALLOON ANGIOPLASTY;  Surgeon: Laurence Redell CROME, MD;  Location: [facility] INVASIVE CV LAB;  Service: Cardiovascular;  Laterality: Right;  common iliac    PERIPHERAL VASCULAR BALLOON ANGIOPLASTY  03/09/2020   Procedure: PERIPHERAL VASCULAR BALLOON ANGIOPLASTY;  Surgeon: Gretta Lonni PARAS,  MD;  Location: [facility] INVASIVE CV LAB;  Service: Cardiovascular;;   PR VEIN BYPASS GRAFT,AORTO-FEM-POP  06/22/2010   Redo Left Fem-Pop   Repair of left arm fracture     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    Immunization History  Administered Date(s) Administered   INFLUENZA, HIGH DOSE SEASONAL PF 10/14/2016, 09/21/2018   Influenza-Unspecified 11/08/2008, 12/10/2019   Moderna Sars-Covid-2 Vaccination 02/19/2019, 03/22/2019, 11/23/2019   PPD Test 09/22/2007   Td (Adult),5 Lf Tetanus Toxid, Preservative Free 02/09/2001   Tdap 02/09/2001, 08/05/2018     Objective: Vital Signs: BP 104/66 (BP Location: Left Arm, Patient Position: Sitting, Cuff Size: Normal)   Pulse 84   Resp 16   Ht 5' 9 (1.753 m)   Wt 164 lb (74.4 kg)   BMI 24.22 kg/m    Physical Exam Vitals and nursing note reviewed.  Constitutional:      Appearance: He is well-developed.  HENT:     Head: Normocephalic and atraumatic.  Eyes:     Conjunctiva/sclera: Conjunctivae normal.     Pupils: Pupils are equal, round, and reactive to light.  Cardiovascular:     Rate and Rhythm: Normal rate and regular rhythm.     Heart sounds: Normal heart sounds.  Pulmonary:     Effort: Pulmonary effort is normal.     Breath sounds: Normal breath sounds.  Abdominal:     General: Bowel sounds are normal.     Palpations: Abdomen is soft.  Musculoskeletal:     Cervical back: Normal range of motion and neck supple.  Skin:    General: Skin is warm and dry.     Capillary Refill: Capillary refill takes less than 2 seconds.  Neurological:     Mental Status: He is alert and oriented to person, place, and time.  Psychiatric:        Behavior: Behavior normal.      Musculoskeletal Exam: C-spine has limited range of motion without rotation.  Discomfort with range of motion of both shoulder joints.  Limited internal rotation.  Flexion  contracture of both elbows.  Limited range of motion of both wrist joints with tenderness of the left wrist.  Synovial thickening of MCP joints but no active synovitis noted.  PIP and DIP thickening noted.  Limited extension of PIP joints.  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  Severely limited range of motion of the left hip with discomfort.  Discomfort range of motion of both knee joints with no effusion.  Tenderness of the left ankle and on the dorsal aspect of the left foot.  Thickening of bilateral first MTP joints.  CDAI Exam: CDAI Score: -- Patient Global: --; Provider Global: -- Swollen: --; Tender: -- Joint Exam 09/02/2023   No joint exam has been documented for this visit   There is currently no information documented on the homunculus. Go to the Rheumatology activity and complete the homunculus joint exam.  Investigation: No additional findings.  Imaging: No results found.   Recent Labs: Lab Results  Component Value Date   WBC 7.6 07/03/2023   HGB 13.4 07/03/2023   PLT 331 07/03/2023   NA 139 07/03/2023   K 4.1 07/03/2023   CL 102 07/03/2023   CO2 27 07/03/2023   GLUCOSE 191 (H) 07/03/2023   BUN 12 07/03/2023   CREATININE 1.07 07/03/2023   BILITOT 0.4 07/03/2023   ALKPHOS 79 01/25/2021   AST 20 07/03/2023   ALT 20 07/03/2023   PROT 7.6 07/03/2023   ALBUMIN 4.5 01/25/2021   CALCIUM  9.1 07/03/2023   GFRAA 83 01/27/2020   QFTBGOLDPLUS NEGATIVE 01/24/2023    Speciality Comments: Prior therapy includes: methotrexate ( d/c alcohol use), Arava (d/c patient preference), Orenica (patient declined)  Procedures:  No procedures performed Allergies: Penicillins   Assessment / Plan:     Visit Diagnoses: 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 factor (HCC) - Patient continues to experience migratory arthralgias and intermittent joint inflammation.  He has been taking Xeljanz  5 mg 1 tablet by mouth daily.  He has not had any  recent gaps in therapy.  He has been experiencing severe pain in the left hip but has not yet scheduled a left hip replacement.  He has also had increased discomfort in both knee joints and had cortisone injections performed bilaterally on 08/21/2023.  He continues to experience  intermittent inflammation in the left wrist and left foot and ankle joint.  CRP obtained on 07/03/2023: Elevated at 10.4.   Different treatment options were discussed today.  Discussed the apprehension of increasing the dose of Xeljanz  given comorbidities and risk for infection. Discussed treatment options with Dr. Dolphus and Devki Gajera, [facility] today.  Discussed the option of adding Plaquenil  as combination therapy.  Indications, contraindications, potential side effects of Plaquenil  reviewed today in detail.  All questions were addressed and consent was obtained.  Discussed the risk for QT prolongation especially with concurrent use of cilostazol  prescribed by Dr. Sheree.  He is scheduled for an upcoming visit with cardiology, Almarie Friar NP on 09/04/2023.  I have reached out to both Dr. Gari and Almarie Crate to get their opinion about proceeding with Plaquenil  as combination therapy.  Discussed the option of updating an EKG this Friday and then maybe again in 6 weeks once he is started on Plaquenil . Patient plans to also call his ophthalmologist to schedule a baseline Plaquenil  eye examination.  The dose of Plaquenil  will be 200 mg 1 tablet by mouth twice daily Monday through Friday.  He will remain on Xeljanz  5 mg 1 tablet daily with close monitoring.  He will follow-up in the office in 6 to 8 weeks to assess his response.  Plan: hydroxychloroquine  (PLAQUENIL ) 200 MG tablet, Comprehensive metabolic panel with GFR, CBC with Differential/Platelet  Patient was counseled on the purpose, proper use, and adverse effects of hydroxychloroquine  including nausea/diarrhea, skin rash, headaches, and sun sensitivity.  Advised patient to wear  sunscreen once starting hydroxychloroquine  to reduce risk of rash associated with sun sensitivity.  Discussed importance of annual eye exams while on hydroxychloroquine  to monitor to ocular toxicity and discussed importance of frequent laboratory monitoring.  Provided patient with eye exam form for baseline ophthalmologic exam.  Provided patient with educational materials on hydroxychloroquine  and answered all questions.  Patient consented to hydroxychloroquine . Will upload consent in the media tab.    Dose will be Plaquenil  200 mg twice daily Monday through Friday.  Prescription pending lab results.  High risk medication use - Xeljanz  5 mg 1 tablet by mouth daily.  Discussed the option of adding on Plaquenil  as combination therapy--I have reached out to cardiology team due to the increased risk for QT prolongation given that he is prescribed cilostazol .  Previous therapy: Methotrexate d/c- alcohol use, Orencia declined by patient, treated with Xeljanz  initially at [facility]. CBC and CMP updated on 07/03/23.  His next lab work will be due in 1 month then every 3 months.  TB gold negative on 01/24/23 Lipid panel updated on 01/24/23 Discussed the importance of holding xeljanz  if he develops any signs or symptoms of an infection and to resume once the infection has completely cleared.    - Plan: hydroxychloroquine  (PLAQUENIL ) 200 MG tablet, Comprehensive metabolic panel with GFR, CBC with Differential/Platelet  Rotator cuff syndrome of right shoulder: Discomfort and limitation with internal rotation.  Contracture of joint of both elbows: Flexion contracture of both elbows.  Primary osteoarthritis of both hands: PIP and DIP thickening.  Limited extension of PIP joints.  History of  bilateral carpal tunnel release: Not currently symptomatic.  Primary osteoarthritis of left hip: X-rays of the left hip are consistent with moderate to severe arthritis.  According to the patient he has been evaluated by  orthopedics and was told he would require a hip replacement.  He has not yet scheduled the procedure.    Chronic pain of  left knee:   Primary osteoarthritis of both feet - X-rays of the right foot updated 06/20/2023: Severe arthritis of the first metatarsal phalangeal joint, favored to be secondary to osteoarthritis.  DDD (degenerative disc disease), cervical: C-spine has limited ROM with lateral rotation.   Palpitations  Mixed hyperlipidemia  Essential hypertension: Blood pressure was 104/66 today in the office.  COPD  GOLD II  Atherosclerosis of native coronary artery of native heart without angina pectoris  History of diabetes mellitus  PVD (peripheral vascular disease) (HCC)  Secondary cardiomyopathy (HCC)  Orders: Orders Placed This Encounter  Procedures   Comprehensive metabolic panel with GFR   CBC with Differential/Platelet   Meds ordered this encounter  Medications   hydroxychloroquine  (PLAQUENIL ) 200 MG tablet    Sig: Take one tablet twice daily Mondays through Fridays only. DO NOT TAKE SATURDAY OR SUNDAY.    Dispense:  120 tablet    Refill:  0     Follow-Up Instructions: Return in about 8 weeks (around 10/28/2023) for Rheumatoid arthritis.   Waddell CHRISTELLA Craze, PA-C  Note - This record has been created using Dragon software.  Chart creation errors have been sought, but may not always  have been located. Such creation errors do not reflect on  the standard of medical care.

## 2023-08-21 ENCOUNTER — Ambulatory Visit (INDEPENDENT_AMBULATORY_CARE_PROVIDER_SITE_OTHER): Admitting: Orthopaedic Surgery

## 2023-08-21 ENCOUNTER — Encounter: Payer: Self-pay | Admitting: Orthopaedic Surgery

## 2023-08-21 DIAGNOSIS — M05762 Rheumatoid arthritis with rheumatoid factor of left knee without organ or systems involvement: Secondary | ICD-10-CM

## 2023-08-21 DIAGNOSIS — M05761 Rheumatoid arthritis with rheumatoid factor of right knee without organ or systems involvement: Secondary | ICD-10-CM | POA: Diagnosis not present

## 2023-08-21 MED ORDER — METHYLPREDNISOLONE ACETATE 40 MG/ML IJ SUSP
40.0000 mg | Freq: Once | INTRAMUSCULAR | Status: AC
  Administered 2023-08-21: 40 mg via INTRA_ARTICULAR

## 2023-08-21 NOTE — Progress Notes (Signed)
 My knees hurt.  I want shots.  I have seen him for his left hip pain.  He has rheumatoid arthritis.  He had asked about injections for the knees last visit.  He has considered this and would like to have injections today.  Both knees have crepitus, slight effusions, ROM right is 0 to 105, left 0 to 110, slight limp to left.  NV intact.  Knees stable, no distal edema.  Encounter Diagnosis  Name Primary?   Rheumatoid arthritis involving both knees with positive rheumatoid factor (HCC) Yes   PROCEDURE NOTE:  The patient requests injections of the right knee , verbal consent was obtained.  The right knee was prepped appropriately after time out was performed.   Sterile technique was observed and injection of 1 cc of DepoMedrol 40mg  with several cc's of plain xylocaine . Anesthesia was provided by ethyl chloride and a 20-gauge needle was used to inject the knee area. The injection was tolerated well.  A band aid dressing was applied.  The patient was advised to apply ice later today and tomorrow to the injection sight as needed.  PROCEDURE NOTE:  The patient requests injections of the left knee , verbal consent was obtained.  The left knee was prepped appropriately after time out was performed.   Sterile technique was observed and injection of 1 cc of DepoMedrol 40 mg with several cc's of plain xylocaine . Anesthesia was provided by ethyl chloride and a 20-gauge needle was used to inject the knee area. The injection was tolerated well.  A band aid dressing was applied.  The patient was advised to apply ice later today and tomorrow to the injection sight as needed.  Return prn.  Call if any problem.  Precautions discussed.  Electronically Signed Lemond Stable, MD 8/14/20258:43 AM

## 2023-08-21 NOTE — Addendum Note (Signed)
 Addended by: VICENTA EMMIE HERO on: 08/21/2023 09:04 AM   Modules accepted: Orders

## 2023-08-29 ENCOUNTER — Encounter: Payer: Self-pay | Admitting: Radiology

## 2023-09-02 ENCOUNTER — Ambulatory Visit: Attending: Physician Assistant | Admitting: Physician Assistant

## 2023-09-02 ENCOUNTER — Encounter: Payer: Self-pay | Admitting: Physician Assistant

## 2023-09-02 VITALS — BP 104/66 | HR 84 | Resp 16 | Ht 69.0 in | Wt 164.0 lb

## 2023-09-02 DIAGNOSIS — M1612 Unilateral primary osteoarthritis, left hip: Secondary | ICD-10-CM | POA: Diagnosis not present

## 2023-09-02 DIAGNOSIS — M5030 Other cervical disc degeneration, unspecified cervical region: Secondary | ICD-10-CM

## 2023-09-02 DIAGNOSIS — M24522 Contracture, left elbow: Secondary | ICD-10-CM

## 2023-09-02 DIAGNOSIS — Z8639 Personal history of other endocrine, nutritional and metabolic disease: Secondary | ICD-10-CM

## 2023-09-02 DIAGNOSIS — M25562 Pain in left knee: Secondary | ICD-10-CM | POA: Diagnosis not present

## 2023-09-02 DIAGNOSIS — G8929 Other chronic pain: Secondary | ICD-10-CM

## 2023-09-02 DIAGNOSIS — J449 Chronic obstructive pulmonary disease, unspecified: Secondary | ICD-10-CM

## 2023-09-02 DIAGNOSIS — M75101 Unspecified rotator cuff tear or rupture of right shoulder, not specified as traumatic: Secondary | ICD-10-CM | POA: Diagnosis not present

## 2023-09-02 DIAGNOSIS — Z9889 Other specified postprocedural states: Secondary | ICD-10-CM | POA: Diagnosis not present

## 2023-09-02 DIAGNOSIS — R002 Palpitations: Secondary | ICD-10-CM | POA: Diagnosis not present

## 2023-09-02 DIAGNOSIS — M19072 Primary osteoarthritis, left ankle and foot: Secondary | ICD-10-CM

## 2023-09-02 DIAGNOSIS — I10 Essential (primary) hypertension: Secondary | ICD-10-CM

## 2023-09-02 DIAGNOSIS — E782 Mixed hyperlipidemia: Secondary | ICD-10-CM | POA: Diagnosis not present

## 2023-09-02 DIAGNOSIS — Z79899 Other long term (current) drug therapy: Secondary | ICD-10-CM

## 2023-09-02 DIAGNOSIS — I739 Peripheral vascular disease, unspecified: Secondary | ICD-10-CM

## 2023-09-02 DIAGNOSIS — M0609 Rheumatoid arthritis without rheumatoid factor, multiple sites: Secondary | ICD-10-CM

## 2023-09-02 DIAGNOSIS — I429 Cardiomyopathy, unspecified: Secondary | ICD-10-CM

## 2023-09-02 DIAGNOSIS — M19042 Primary osteoarthritis, left hand: Secondary | ICD-10-CM

## 2023-09-02 DIAGNOSIS — M24521 Contracture, right elbow: Secondary | ICD-10-CM

## 2023-09-02 DIAGNOSIS — M19041 Primary osteoarthritis, right hand: Secondary | ICD-10-CM

## 2023-09-02 DIAGNOSIS — M19071 Primary osteoarthritis, right ankle and foot: Secondary | ICD-10-CM

## 2023-09-02 DIAGNOSIS — I2510 Atherosclerotic heart disease of native coronary artery without angina pectoris: Secondary | ICD-10-CM

## 2023-09-02 MED ORDER — HYDROXYCHLOROQUINE SULFATE 200 MG PO TABS: ORAL_TABLET | ORAL | 0 refills | Status: AC

## 2023-09-02 NOTE — Patient Instructions (Signed)
 Hydroxychloroquine Tablets What is this medication? HYDROXYCHLOROQUINE (hye drox ee KLOR oh kwin) treats autoimmune conditions, such as rheumatoid arthritis and lupus. It works by slowing down an overactive immune system. It may also be used to prevent and treat malaria. It works by killing the parasite that causes malaria. It belongs to a group of medications called DMARDs. This medicine may be used for other purposes; ask your health care provider or pharmacist if you have questions. COMMON BRAND NAME(S): Plaquenil, Quineprox, SOVUNA What should I tell my care team before I take this medication? They need to know if you have any of these conditions: Diabetes Eye disease, vision problems Frequently drink alcohol G6PD deficiency Heart disease Irregular heartbeat or rhythm Kidney disease Liver disease Porphyria Psoriasis An unusual or allergic reaction to hydroxychloroquine, other medications, foods, dyes, or preservatives Pregnant or trying to get pregnant Breastfeeding How should I use this medication? Take this medication by mouth with water. Take it as directed on the prescription label. Do not cut, crush, or chew this medication. Swallow the tablets whole. Take it with food. Do not take it more than directed. Take all of this medication unless your care team tells you to stop it early. Keep taking it even if you think you are better. Take products with antacids in them at a different time of day than this medication. Take this medication 4 hours before or 4 hours after antacids. Talk to your care team if you have questions. Talk to your care team about the use of this medication in children. While this medication may be prescribed for selected conditions, precautions do apply. Overdosage: If you think you have taken too much of this medicine contact a poison control center or emergency room at once. NOTE: This medicine is only for you. Do not share this medicine with others. What if I  miss a dose? If you miss a dose, take it as soon as you can. If it is almost time for your next dose, take only that dose. Do not take double or extra doses. What may interact with this medication? Do not take this medication with any of the following: Cisapride Dronedarone Pimozide Thioridazine This medication may also interact with the following: Ampicillin Antacids Cimetidine Cyclosporine Digoxin Kaolin Medications for diabetes, such as insulin, glipizide, glyburide Medications for seizures, such as carbamazepine, phenobarbital, phenytoin Mefloquine Methotrexate Other medications that cause heart rhythm changes Praziquantel This list may not describe all possible interactions. Give your health care provider a list of all the medicines, herbs, non-prescription drugs, or dietary supplements you use. Also tell them if you smoke, drink alcohol, or use illegal drugs. Some items may interact with your medicine. What should I watch for while using this medication? Visit your care team for regular checks on your progress. Tell your care team if your symptoms do not start to get better or if they get worse. You may need blood work done while you are taking this medication. If you take other medications that can affect heart rhythm, you may need more testing. Talk to your care team if you have questions. Your vision may be tested before and during use of this medication. Tell your care team right away if you have any change in your eyesight. This medication may cause serious skin reactions. They can happen weeks to months after starting the medication. Contact your care team right away if you notice fevers or flu-like symptoms with a rash. The rash may be red or purple and then  turn into blisters or peeling of the skin. Or, you might notice a red rash with swelling of the face, lips or lymph nodes in your neck or under your arms. If you or your family notice any changes in your behavior, such as  new or worsening depression, thoughts of harming yourself, anxiety, or other unusual or disturbing thoughts, or memory loss, call your care team right away. What side effects may I notice from receiving this medication? Side effects that you should report to your care team as soon as possible: Allergic reactions--skin rash, itching, hives, swelling of the face, lips, tongue, or throat Aplastic anemia--unusual weakness or fatigue, dizziness, headache, trouble breathing, increased bleeding or bruising Change in vision Heart rhythm changes--fast or irregular heartbeat, dizziness, feeling faint or lightheaded, chest pain, trouble breathing Infection--fever, chills, cough, or sore throat Low blood sugar (hypoglycemia)--tremors or shaking, anxiety, sweating, cold or clammy skin, confusion, dizziness, rapid heartbeat Muscle injury--unusual weakness or fatigue, muscle pain, dark yellow or brown urine, decrease in amount of urine Pain, tingling, or numbness in the hands or feet Rash, fever, and swollen lymph nodes Redness, blistering, peeling, or loosening of the skin, including inside the mouth Thoughts of suicide or self-harm, worsening mood, or feelings of depression Unusual bruising or bleeding Side effects that usually do not require medical attention (report to your care team if they continue or are bothersome): Diarrhea Headache Nausea Stomach pain Vomiting This list may not describe all possible side effects. Call your doctor for medical advice about side effects. You may report side effects to FDA at 1-800-FDA-1088. Where should I keep my medication? Keep out of the reach of children and pets. Store at room temperature up to 30 degrees C (86 degrees F). Protect from light. Get rid of any unused medication after the expiration date. To get rid of medications that are no longer needed or have expired: Take the medication to a medication take-back program. Check with your pharmacy or law  enforcement to find a location. If you cannot return the medication, check the label or package insert to see if the medication should be thrown out in the garbage or flushed down the toilet. If you are not sure, ask your care team. If it is safe to put it in the trash, empty the medication out of the container. Mix the medication with cat litter, dirt, coffee grounds, or other unwanted substance. Seal the mixture in a bag or container. Put it in the trash. NOTE: This sheet is a summary. It may not cover all possible information. If you have questions about this medicine, talk to your doctor, pharmacist, or health care provider.  2024 [employer] Standard (2021-07-02 00:00:00)

## 2023-09-02 NOTE — Progress Notes (Signed)
 Pharmacy Note  Subjective: Patient presents today to [facility] for follow up office visit.   Patient seen by the pharmacist for counseling on hydroxychloroquine  for rheumatoid arthritis.  Currently on low-dose Xeljanz   Objective: CMP     Component Value Date/Time   NA 139 07/03/2023 1006   NA 142 08/02/2020 1000   K 4.1 07/03/2023 1006   CL 102 07/03/2023 1006   CO2 27 07/03/2023 1006   GLUCOSE 191 (H) 07/03/2023 1006   BUN 12 07/03/2023 1006   BUN 16 08/02/2020 1000   CREATININE 1.07 07/03/2023 1006   CALCIUM  9.1 07/03/2023 1006   PROT 7.6 07/03/2023 1006   PROT 7.2 08/02/2020 1000   ALBUMIN 4.5 01/25/2021 1117   ALBUMIN 4.5 08/02/2020 1000   AST 20 07/03/2023 1006   ALT 20 07/03/2023 1006   ALKPHOS 79 01/25/2021 1117   BILITOT 0.4 07/03/2023 1006   BILITOT 0.3 08/02/2020 1000   GFRNONAA >60 03/19/2022 0911   GFRNONAA 71 01/27/2020 0941   GFRAA 83 01/27/2020 0941    CBC    Component Value Date/Time   WBC 7.6 07/03/2023 1006   RBC 4.80 07/03/2023 1006   HGB 13.4 07/03/2023 1006   HGB 10.6 (L) 11/05/2021 0858   HCT 43.8 07/03/2023 1006   HCT 37.2 (L) 11/05/2021 0858   PLT 331 07/03/2023 1006   PLT 412 11/05/2021 0858   MCV 91.3 07/03/2023 1006   MCV 81 11/05/2021 0858   MCH 27.9 07/03/2023 1006   MCHC 30.6 (L) 07/03/2023 1006   RDW 15.2 (H) 07/03/2023 1006   RDW 21.0 (H) 11/05/2021 0858   LYMPHSABS 2,008 10/16/2022 0903   LYMPHSABS 1.4 11/05/2021 0858   MONOABS 0.4 03/19/2022 0904   EOSABS 91 07/03/2023 1006   EOSABS 0.1 11/05/2021 0858   BASOSABS 38 07/03/2023 1006   BASOSABS 0.0 11/05/2021 0858    Assessment/Plan: Patient was counseled on the purpose, proper use, and adverse effects of hydroxychloroquine  including nausea/diarrhea, skin rash, headaches, and sun sensitivity.  Advised patient to wear sunscreen once starting hydroxychloroquine  to reduce risk of rash associated with sun sensitivity.  Discussed importance of annual eye exams while  on hydroxychloroquine  to monitor to ocular toxicity and discussed importance of frequent laboratory monitoring.  Provided patient with eye exam form for baseline ophthalmologic exam.  Provided patient with educational materials on hydroxychloroquine  and answered all questions.  Patient consented to hydroxychloroquine . Will upload consent in the media tab.    Reviewed risk for QTC prolongation when used in combination with other QTc prolonging agents (including but not limited to antiarrhythmics, macrolide antibiotics, flouroquinolone antibiotics, haloperidol, quetiapine, olanzapine, risperidone, droperidol, ziprasidone, amitriptyline, citalopram, ondansetron , migraine triptans, and methadone).   Dose will be Plaquenil  200 mg twice daily Monday through Friday.    Sherry Pennant, PharmD, MPH, BCPS, CPP Clinical Pharmacist (Rheumatology and Pulmonology)

## 2023-09-04 ENCOUNTER — Ambulatory Visit: Attending: Nurse Practitioner | Admitting: Nurse Practitioner

## 2023-09-04 ENCOUNTER — Encounter: Payer: Self-pay | Admitting: Nurse Practitioner

## 2023-09-04 VITALS — BP 118/70 | HR 88 | Ht 69.0 in | Wt 167.0 lb

## 2023-09-04 DIAGNOSIS — I739 Peripheral vascular disease, unspecified: Secondary | ICD-10-CM | POA: Diagnosis not present

## 2023-09-04 DIAGNOSIS — I6523 Occlusion and stenosis of bilateral carotid arteries: Secondary | ICD-10-CM

## 2023-09-04 DIAGNOSIS — R0789 Other chest pain: Secondary | ICD-10-CM | POA: Diagnosis not present

## 2023-09-04 DIAGNOSIS — E785 Hyperlipidemia, unspecified: Secondary | ICD-10-CM

## 2023-09-04 DIAGNOSIS — I10 Essential (primary) hypertension: Secondary | ICD-10-CM | POA: Diagnosis not present

## 2023-09-04 DIAGNOSIS — I5032 Chronic diastolic (congestive) heart failure: Secondary | ICD-10-CM

## 2023-09-04 DIAGNOSIS — I25119 Atherosclerotic heart disease of native coronary artery with unspecified angina pectoris: Secondary | ICD-10-CM | POA: Diagnosis not present

## 2023-09-04 NOTE — Patient Instructions (Addendum)

## 2023-09-04 NOTE — Progress Notes (Addendum)
 Cardiology Office Note:    Date: 09/04/2023 ID:  [name], DOB [birth year], MRN [id number] PCP:  Brett Leta NOVAK, MD [facility] Providers Cardiologist:  Brett Sierras, MD    Referring MD: Brett Leta NOVAK, MD   CC: Here for follow-up  History of Present Illness:    Tell [name] is a very pleasant 73 y.o. male with a PMH of HFmrEF, palpitations/PSVT, HLD, PAD, carotid artery disease, COPD, T2DM, RA, HTN, GERD, and hx of DVT, who presents today for scheduled follow-up.    Previous cardiovascular history of nonobstructive CAD by cardiac catheterization as seen in 2007, 2013, and 2021.  He is closely followed by [facility] for history of PAD, status post left fem-pop bypass in 2007, with PTCA and stenting of right iliac artery in 2013.  Recent cardiac monitor demonstrated brief episodes of PSVT.  Was started on beta-blocker.  Echocardiogram revealed mildly reduced left ventricular systolic function at 45 to 50%, mild apical septal hypokinesis.  Last seen by Dr. Sierras on January 17, 2022.  Was doing well at that time.  Claudication was stable, symptoms noted more on right than left.  Lexiscan  Myoview  was arranged and revealed findings consistent with inferior wall infarct scar and moderate peri-infarct ischemia, EF measured at 53%.  Patient did not take beta-blocker as instructed from previous office visit, Dr. Sierras started Toprol -XL 12.5 mg daily.   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   I last saw him for follow-up on September 18, 2022. Was doing well.    01/16/2023 - He recently contacted our office noting intermittent chest pain x 2 weeks. Today he presents for chest pain evaluation, describes this as occurring along under his left shoulder that radiates to his chest. He reported nausea and dizziness associated with the pain. Stated pain worse at night with laying down on this side. He  describes the pain as moving middle of chest to under his left armpit for me today. This has been chronic and stable over time. Denies any worsening shortness of breath, palpitations, syncope, presyncope, dizziness, orthopnea, PND, swelling or significant weight changes, acute bleeding, or claudication.  CCTA 02/2023 revealed coronary calcium  score of 734, with findings of minimal, nonobstructive CAD.   04/01/2023 - He presents today for follow-up. Not having CP as often as he used to, believes his symptoms are MSK related, sometimes experiences this when he exercises. Denies any recent/activechest pain, shortness of breath, palpitations, syncope, presyncope, dizziness, orthopnea, PND, swelling or significant weight changes, acute bleeding, or claudication.  07/18/2023 - Here for follow-up. Tells me he notices CP mainly at night, lasting for a few seconds in duration, is made worse with sleeping on his left side, and alleviated when laying on his right side. Tells he he has not taken NTG. Denies any shortness of breath, palpitations, syncope, presyncope, dizziness, orthopnea, PND, swelling or significant weight changes, acute bleeding, or claudication. Now mainly working on leg exercises in the gym.   09/04/2023 - Here for follow-up. Admits to symptoms of RA flare-up. Attributes some of his atypical CP to what he believes is gas. Has upcoming visit with [facility]. Overall doing well from a cardiac standpoint. Denies any shortness of breath, palpitations, syncope, presyncope, dizziness, orthopnea, PND, swelling or significant weight changes, acute bleeding, or claudication.  SH: Very active and exercises every day.  ROS:   Please see the history of present illness.     All other systems  reviewed and are negative.  EKGs/Labs/Other Studies Reviewed:    The following studies were reviewed today:  EKG:  EKG Interpretation Date/Time:  Thursday September 04 2023 08:39:21 EDT Ventricular Rate:  98 PR  Interval:  174 QRS Duration:  78 QT Interval:  362 QTC Calculation: 462 R Axis:   57  Text Interpretation: Normal sinus rhythm Cannot rule out Anterior infarct , age undetermined When compared with ECG of 16-Jan-2023 10:31, No significant change was found Confirmed by [name] [phone]) on 09/04/2023 8:43:49 AM    Echo 05/2023:  1. Left ventricular ejection fraction, by estimation, is 50 to 55%. The  left ventricle has low normal function. The left ventricle has no regional  wall motion abnormalities. Left ventricular diastolic parameters are  consistent with Grade I diastolic  dysfunction (impaired relaxation).   2. Right ventricular systolic function is normal. The right ventricular  size is normal. Tricuspid regurgitation signal is inadequate for assessing  PA pressure.   3. The mitral valve is normal in structure. No evidence of mitral valve  regurgitation. No evidence of mitral stenosis.   4. The aortic valve is tricuspid. Aortic valve regurgitation is mild. No  aortic stenosis is present.   Comparison(s): A prior study was performed on 01/10/2022. EF 45-50%. Mild aortic valve regurgitation. Aortic dilatation of 39 mm.   Carotid duplex 03/2023:  Summary:  Right Carotid: Velocities in the right ICA are consistent with a 1-39%  stenosis. Non-hemodynamically significant plaque <50% noted in the  CCA. The ECA appears <50% stenosed.   Left Carotid: Velocities in the left ICA are consistent with a 1-39%  stenosis. Non-hemodynamically significant plaque <50% noted in the  CCA. The ECA appears <50% stenosed.   Vertebrals:  Bilateral vertebral arteries demonstrate antegrade flow.  Subclavians: Normal flow hemodynamics were seen in bilateral subclavian arteries.   *See table(s) above for measurements and observations.  Suggest follow up study in 12 months.  ABI's 02/2023:  Summary:  Right: Resting right ankle-brachial index indicates noncompressible right  lower extremity  arteries. The right toe-brachial index is abnormal.   Left: Resting left ankle-brachial index indicates noncompressible left  lower extremity arteries. The left toe-brachial index is abnormal.  Summary:  Left: Patent left fem-pop bypass graft with no stenosis. Graft velocities  at the 40 cm/s mark.  CCTA 02/2023:  IMPRESSION: 1. Coronary calcium  score of 734. This was 90th percentile for age-, sex, and race-matched controls.   2. Total plaque volume 655 mm3. (Calcified plaque 137 mm3; non-calcified plaque 518 mm3). 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 Percentile for age- and sex-matched controls is not currently available.   3. Normal coronary origin with right dominance.   4. There are regions of heavily calcified plaque, but no obstructive coronary disease. There is mild (25-49%) obstruction in the LAD. CAD-RADS 2.   5. Aortic atherosclerosis.  Vascular ultrasound bypass graft duplex 05/2022: Left: Patent left common femoral-popliteal artery bypass graft.  Vascular ultrasound aorta/IVC/iliacs Doppler limited 05/2022: See full report under CV Proc  ABIs 05/2022: Summary:  Right: Resting right ankle-brachial index indicates noncompressible right  lower extremity arteries. The right toe-brachial index is abnormal.   Left: Resting left ankle-brachial index is within normal range. The left  toe-brachial index is abnormal.  Carotid Doppler 03/2022: Summary:  Right Carotid: Velocities in the right ICA are consistent with a 1-39%  stenosis.   Left Carotid: Velocities in the left ICA are consistent with a 1-39%  stenosis.   Vertebrals: Bilateral vertebral arteries demonstrate antegrade flow.  Subclavians: Normal flow hemodynamics were seen in bilateral subclavian               arteries.   Myoview  on 01/24/2022:    Findings are consistent with prior inferior infarction with mild peri-infarct ischemia. The study is low risk.   No ST deviation was noted.   LV perfusion is abnormal. Moderate size  moderate intensity inferior defect with mild reversibility.   Left ventricular function is normal. Nuclear stress EF: 53 %. The left ventricular ejection fraction is normal (55-65%). End diastolic cavity size is normal.      Echocardiogram on 01/10/2022:  1. Left ventricular ejection fraction, by estimation, is 45 to 50%. The  left ventricle has mildly decreased function. The left ventricle  demonstrates regional wall motion abnormalities (see scoring  diagram/findings for description). There is mild  concentric left ventricular hypertrophy. Left ventricular diastolic  parameters are consistent with Grade I diastolic dysfunction (impaired  relaxation).   2. Right ventricular systolic function is normal. The right ventricular  size is normal.   3. Left atrial size was upper normal.   4. The mitral valve is grossly normal. Trivial mitral valve  regurgitation.   5. The aortic valve is tricuspid. There is mild calcification of the  aortic valve. Aortic valve regurgitation is mild. Aortic regurgitation PHT  measures 531 msec. Aortic valve mean gradient measures 8.0 mmHg.   6. Aortic dilatation noted. There is borderline dilatation of the aortic  root, measuring 39 mm.   7. The inferior vena cava is normal in size with greater than 50%  respiratory variability, suggesting right atrial pressure of 3 mmHg.   Comparison(s): Prior images unable to be directly viewed. LVEF reported at  50% at angiography in 2021.  Monitor on 01/01/2022:  Patient had a min HR of 59 bpm (sinus bradycardia), max HR of 203 bpm (1.9 second duration of brief supraventricular tachycardia), and avg HR of 101 bpm (sinus tachycardia). Predominant underlying rhythm was Sinus Rhythm. 6 Supraventricular Tachycardia runs occurred, the run with the fastest interval lasting 5 beats (1.9 second duration) with a max rate of 203 bpm, the longest lasting 19 beats (6.8 seconds) with an avg rate of 163 bpm. Isolated SVEs were occasional  (1.4%, 13864), SVE Couplets were rare (<1.0%, 88), and SVE Triplets were rare (<1.0%, 5). Isolated VEs were rare (<1.0%, 3894), VE Couplets were rare (<1.0%, 22), and VE Triplets were rare (<1.0%, 1). Ventricular Bigeminy was present.    Impression: Brief supraventricular tachycardia detected (6 episodes in 7 days; longest duration 6.8 seconds).  Occasional PACs (1.4% burden).   Cardiac cath on 04/14/2019:  Prox LAD to Mid LAD lesion is 20% stenosed. Mid LAD lesion is 30% stenosed. Prox RCA lesion is 30% stenosed.   Mild non-obstructive CAD with 20 and 30% irregularities in the proximal to mid LAD; normal ramus intermediate and left circumflex coronary arteries;  and RCA with smooth 30% proximal narrowing with distal vessel tortuosity.   Low normal global LV function with EF estimated at 50%; LVEDP 14 mmHg.   RECOMMENDATION: Medical therapy for CAD.  Will increase lipid-lowering therapy to atorvastatin  40 mg.  Maintain optimal blood pressure control with target less than 130/80 and ideal blood pressure less than 120/80.  Target LDL less than 70.  Risk Assessment/Calculations:    The 10-year ASCVD risk score (Arnett DK, et al., 2019) is: 34.8%   Values used to calculate the score:     Age: 79 years  Clincally relevant sex: Male     Is Non-Hispanic African American: Yes     Diabetic: Yes     Tobacco smoker: No     Systolic Blood Pressure: 136 mmHg     Is BP treated: Yes     HDL Cholesterol: 53 mg/dL     Total Cholesterol: 142 mg/dL  Physical Exam:    VS:  BP 118/70   Pulse 88   Ht 5' 9 (1.753 m)   Wt 167 lb (75.8 kg)   SpO2 94%   BMI 24.66 kg/m     Wt Readings from Last 3 Encounters:  09/05/23 165 lb 9.6 oz (75.1 kg)  09/04/23 167 lb (75.8 kg)  09/02/23 164 lb (74.4 kg)     GEN:  Well nourished, well developed in no acute distress HEENT: Normal NECK: No JVD; no carotid bruit, mild left carotid bruit CARDIAC: S1/S2, RRR, no murmurs, rubs, gallops; 2+  pulses RESPIRATORY:  Clear to auscultation without rales, wheezing or rhonchi  MUSCULOSKELETAL:  No edema; No deformity  SKIN: Warm and dry NEUROLOGIC:  Alert and oriented x 3 PSYCHIATRIC:  Normal affect   ASSESSMENT & PLAN:    In order of problems listed above:  HFimpEF TTE 05/2023 showed EF mildly returned to normal, 50-55%, RWMA, mild LVH, grade 1 DD. Euvolemic and well compensated on exam. Continue GDMT - BP does not allow room to uptitrate GDMT at this time. Low sodium diet, fluid restriction <2L, and daily weights encouraged. Educated to contact our office for weight gain of 2 lbs overnight or 5 lbs in one week. Heart healthy diet and regular cardiovascular exercise encouraged.  Could not tolerate Entresto .   Minimal CAD, atypical chest pain  Admits to chronic, stable symptoms that are atypical in etiology per his report, pt attributes this to gas, and I also believe his RA is contributing to his symptoms. See CCTA noted above. Continue atorvastatin , Plavix , Toprol  XL, and NTG PRN. Previously educated him regarding NTG. Care and ED precautions discussed. Came to shared medical decision that we will monitor for now, Tylenol  PRN recommended.   PAD, HLD, carotid artery stenosis Last LDL 74 in January 2025. He is closely followed by [facility] for history of PAD, status post left fem-pop bypass in 2007, with PTCA and stenting of right iliac artery in 2013. See vascular studies noted above. Continue Plavix , and Atorvastatin .  EKG is without acute ischemic changes today. Will consult [facility] to stop Pletal  as previously recommended by Dr. Debera - Rheumatology wants to adjust medications that would not affect QT prolongation but also help manage his RA. Heart healthy diet and regular cardiovascular exercise encouraged.   Addendum: Consulted Ahmed Holster, PA-C with [facility] who stated okay to stop Pletal . I have updated CMA and will update patient with instructions and also looped in Waddell Craze, PA-C with  Rheumatology.   HTN BP stable. Continue current medication regimen. Discussed to monitor BP at home at least 2 hours after medications and sitting for 5-10 minutes. Heart healthy diet and regular cardiovascular exercise encouraged.   I spent a total duration of 30 minutes reviewing prior notes, reviewing outside records including  labs, EKG today, face-to-face counseling of  medical condition, pathophysiology, evaluation, management, and documenting the findings in the note.   Disposition: Care and ED precautions discussed.Follow-up with MD or APP in 6 months or sooner if anything changes.    Medication Adjustments/Labs and Tests Ordered: Current medicines are reviewed at length with the patient today.  Concerns regarding medicines are outlined above.  Orders Placed This Encounter  Procedures   EKG 12-Lead   No orders of the defined types were placed in this encounter.   Patient Instructions  Medication Instructions:  Your physician recommends that you continue on your current medications as directed. Please refer to the Current Medication list given to you today.  Labwork: None   Testing/Procedures: None   Follow-Up: Your physician recommends that you schedule a follow-up appointment in: 6 Months   Any Other Special Instructions Will Be Listed Below (If Applicable).  If you need a refill on your cardiac medications before your next appointment, please call your pharmacy.   Signed, Almarie Crate, NP

## 2023-09-05 ENCOUNTER — Ambulatory Visit: Payer: 59 | Attending: Vascular Surgery | Admitting: Physician Assistant

## 2023-09-05 ENCOUNTER — Ambulatory Visit (HOSPITAL_BASED_OUTPATIENT_CLINIC_OR_DEPARTMENT_OTHER)
Admission: RE | Admit: 2023-09-05 | Discharge: 2023-09-05 | Disposition: A | Discharge status: Home / Self Care | Payer: 59 | Source: Ambulatory Visit | Attending: Physician Assistant | Admitting: Physician Assistant

## 2023-09-05 ENCOUNTER — Ambulatory Visit (HOSPITAL_COMMUNITY)
Admission: RE | Admit: 2023-09-05 | Discharge: 2023-09-05 | Disposition: A | Discharge status: Home / Self Care | Payer: 59 | Source: Ambulatory Visit | Attending: Physician Assistant | Admitting: Physician Assistant

## 2023-09-05 VITALS — BP 136/92 | HR 76 | Temp 97.9°F | Wt 165.6 lb

## 2023-09-05 DIAGNOSIS — I739 Peripheral vascular disease, unspecified: Secondary | ICD-10-CM | POA: Insufficient documentation

## 2023-09-05 DIAGNOSIS — Z95828 Presence of other vascular implants and grafts: Secondary | ICD-10-CM | POA: Insufficient documentation

## 2023-09-05 DIAGNOSIS — T82599A Other mechanical complication of unspecified cardiac and vascular devices and implants, initial encounter: Secondary | ICD-10-CM | POA: Diagnosis not present

## 2023-09-05 NOTE — Progress Notes (Signed)
 Office Note   History of Present Illness   [name] is a 73 y.o. (06-25-1950) male who presents for surveillance of PAD.  He has a history of the following vascular procedures:   1) left common femoral to above-knee popliteal artery bypass with PTFE in 2007 by Dr. Gerlean 2) redo left common femoral to above-knee popliteal artery bypass with PTFE in 2012 by Dr. Gerlean 3) right common iliac artery angioplasty and stenting in 2013 by Dr. Laurence 4) right common iliac artery angioplasty in 2019 by Dr. Laurence 5) right anterior tibial artery and SFA angioplasty in 2022 by Dr. Gretta  He returns today for follow-up.  He continues to have a lot of orthopedic pain in his hips, knees, and ankles due to his rheumatoid arthritis.  He says that it makes it hard to walk.  He says that his rheumatologist wants to start him on hydroxychloroquine  to see if this will help with his RA however he needs clearance from cardiology and our office to stop his Pletal .  He denies any claudication, rest pain, or tissue loss.  Current Outpatient Medications  Medication Sig Dispense Refill   albuterol  (PROAIR  HFA) 108 (90 Base) MCG/ACT inhaler Inhale 2 puffs into the lungs every 6 (six) hours as needed for wheezing or shortness of breath. 8 g 2   atorvastatin  (LIPITOR) 80 MG tablet TAKE 1 TABLET(80 MG) BY MOUTH DAILY 90 tablet 2   baclofen (LIORESAL) 10 MG tablet Take 10 mg by mouth 2 (two) times daily.     cilostazol  (PLETAL ) 100 MG tablet TAKE 1 TABLET(100 MG) BY MOUTH TWICE DAILY 180 tablet 3   ciprofloxacin (CILOXAN) 0.3 % ophthalmic solution Place 1 drop into the right eye 4 (four) times daily.     clopidogrel  (PLAVIX ) 75 MG tablet Take 1 tablet (75 mg total) by mouth daily. 30 tablet 11   dexlansoprazole  (DEXILANT ) 60 MG capsule Take 1 capsule (60 mg total) by mouth daily. 30 capsule 11   diclofenac  Sodium (VOLTAREN ) 1 % GEL Apply 2 gram  to painful foot and ankle joint twice daily. 150 g 1   empagliflozin  (JARDIANCE) 25 MG TABS tablet Take 25 mg by mouth in the morning.     glipiZIDE (GLUCOTROL) 5 MG tablet Take 5-10 mg by mouth See admin instructions.     HYDROcodone-acetaminophen  (NORCO) 7.5-325 MG per tablet Take 1 tablet by mouth 2 (two) times daily as needed (pain.).     hydrocortisone  2.5 % lotion Apply topically daily. Apply to affected area of face daily x 1 month then decrease to once a day Monday, Wednesday, Friday 59 mL 11   hydroxychloroquine  (PLAQUENIL ) 200 MG tablet Take one tablet twice daily Mondays through Fridays only. DO NOT TAKE SATURDAY OR SUNDAY. 120 tablet 0   JANUVIA 100 MG tablet Take 100 mg by mouth in the morning.     ketoconazole  (NIZORAL ) 2 % cream Apply to both feet and between toes once daily for 6 weeks. 60 g 1   ketorolac  (ACULAR ) 0.5 % ophthalmic solution Place 1 drop into the right eye 4 (four) times daily.     LANTUS SOLOSTAR 100 UNIT/ML Solostar Pen Inject 10 Units into the skin at bedtime.     meloxicam (MOBIC) 15 MG tablet Take 15 mg by mouth daily as needed.     metoprolol  succinate (TOPROL  XL) 25 MG 24 hr tablet Take 0.5 tablets (12.5 mg total) by mouth daily.     nitroGLYCERIN  (NITROSTAT ) 0.4 MG  SL tablet Place 1 tablet (0.4 mg total) under the tongue every 5 (five) minutes x 3 doses as needed for chest pain (If no relief after 3rd dose, GO TO ER.). 25 tablet 3   Omega-3 Fatty Acids (OMEGA-3 FISH OIL PO) Take 1 capsule by mouth daily. (Patient not taking: Reported on 09/04/2023)     ONE TOUCH ULTRA TEST test strip      OneTouch Delica Lancets 33G MISC Apply topically.     Plecanatide  (TRULANCE ) 3 MG TABS Take 1 tablet (3 mg total) by mouth daily. 30 tablet 5   sildenafil (REVATIO) 20 MG tablet Take 40-60 mg by mouth daily as needed for erectile dysfunction.     SPIRIVA  RESPIMAT 2.5 MCG/ACT AERS INHALE 2 PUFFS INTO THE LUNGS DAILY 4 g 11   SYMBICORT  160-4.5 MCG/ACT inhaler INHALE 2 PUFFS FIRST THING IN THE MORNING AND THEN 2 PUFFS 12 HOURS LATER 10.2 g 5    XELJANZ  5 MG TABS Take 1 tablet (5 mg total) by mouth daily. 60 tablet 0   No current facility-administered medications for this visit.    REVIEW OF SYSTEMS (negative unless checked):   Cardiac:  []  Chest pain or chest pressure? []  Shortness of breath upon activity? []  Shortness of breath when lying flat? []  Irregular heart rhythm?  Vascular:  []  Pain in calf, thigh, or hip brought on by walking? []  Pain in feet at night that wakes you up from your sleep? []  Blood clot in your veins? []  Leg swelling?  Pulmonary:  []  Oxygen at home? []  Productive cough? []  Wheezing?  Neurologic:  []  Sudden weakness in arms or legs? []  Sudden numbness in arms or legs? []  Sudden onset of difficult speaking or slurred speech? []  Temporary loss of vision in one eye? []  Problems with dizziness?  Gastrointestinal:  []  Blood in stool? []  Vomited blood?  Genitourinary:  []  Burning when urinating? []  Blood in urine?  Psychiatric:  []  Major depression  Hematologic:  []  Bleeding problems? []  Problems with blood clotting?  Dermatologic:  []  Rashes or ulcers?  Constitutional:  []  Fever or chills?  Ear/Nose/Throat:  []  Change in hearing? []  Nose bleeds? []  Sore throat?  Musculoskeletal:  []  Back pain? [x]  Joint pain? []  Muscle pain?   Physical Examination   Vitals:   09/05/23 0926  BP: (!) 136/92  Pulse: 76  Temp: 97.9 F (36.6 C)  TempSrc: Temporal  Weight: 165 lb 9.6 oz (75.1 kg)   Body mass index is 24.45 kg/m.  General:  WDWN in NAD; vital signs documented above Gait: Not observed HENT: WNL, normocephalic Pulmonary: normal non-labored breathing , without rales, rhonchi,  wheezing Cardiac: Regular Abdomen: soft, NT, no masses Skin: without rashes Vascular Exam/Pulses: Brisk DP/PT Doppler signals bilaterally Extremities: without ischemic changes, without gangrene , without cellulitis; without open wounds;  Musculoskeletal: no muscle wasting or  atrophy  Neurologic: A&O X 3;  No focal weakness or paresthesias are detected Psychiatric:  The pt has Normal affect.  Non-Invasive Vascular imaging   ABI (09/05/2023) R:  ABI: [place] ([place]),  PT: bi DP: mono TBI: 0.39 L:  ABI: [place] ([place]),  PT: tri DP: mono TBI: 0.51  Aortoiliac Duplex (09/05/2023) Patent right common iliac artery stent without stenosis  LLE BPG Duplex (09/05/2023) Patent left common femoral to above-knee popliteal artery bypass graft without stenosis  Medical Decision Making   Alandis Bluemel is a 73 y.o. male who presents for surveillance of PAD  Based on the patient's vascular studies,  his ABIs remain noncompressible.  He has biphasic right posterior tibial artery flow and triphasic left posterior tibial artery flow Aortoiliac duplex demonstrates a patent right common iliac artery stent without stenosis Duplex demonstrates a patent left common femoral to above-knee popliteal artery bypass graft without stenosis or sluggish velocities He denies any claudication, rest pain, or tissue loss.  His mobility is significantly limited by his orthopedic pain due to rheumatoid arthritis On exam he has brisk DP/PT Doppler signals bilaterally Given that the patient's studies are stable and he remains asymptomatic, he should be okay to discontinue Pletal  from a vascular perspective.  Hopefully this will allow him to try hydroxychloroquine  for his rheumatoid arthritis.  He should continue his Plavix  and statin. He can follow-up with our office in 6 months with repeat ABIs, left lower extremity bypass graft duplex, and right aortoiliac duplex   Ahmed Holster PA-C [facility] Office: [phone]  Clinic MD: [place]

## 2023-09-06 DIAGNOSIS — I10 Essential (primary) hypertension: Secondary | ICD-10-CM | POA: Diagnosis not present

## 2023-09-06 DIAGNOSIS — E1165 Type 2 diabetes mellitus with hyperglycemia: Secondary | ICD-10-CM | POA: Diagnosis not present

## 2023-09-06 LAB — VAS US ABI WITH/WO TBI

## 2023-09-09 ENCOUNTER — Telehealth: Payer: Self-pay | Admitting: Nurse Practitioner

## 2023-09-09 NOTE — Telephone Encounter (Signed)
-----   Message from Almarie Crate sent at 09/09/2023 10:19 AM EDT ----- No problem at all.   Thanks! Will let him know. Tori, please let patient know that he can stop his Pletal  medication. I will also CC Waddell Craze, PA-C with Rheumatology.   Thanks!   Best, Almarie Crate, NP ----- Message ----- From: Elna Ahmed SQUIBB, PA-C Sent: 09/09/2023   9:50 AM EDT To: Almarie Crate, NP  Good morning,   I apologize I havent finished his note yet. But yes, he can d/c the pletal  for the RA meds. He should still continue his Plavix .   Thanks, Ahmed Elna PA-C ----- Message ----- From: Crate Almarie, NP Sent: 09/09/2023   9:49 AM EDT To: Ahmed SQUIBB Elna, PA-C  Hello [place],   I am helping Waddell Craze, PA (Rheumatology) and have consulted cardiologist about his medications. Okay if we d/c Pletal  to adjust his meds to help his RA?  Thanks!   Best, Almarie Crate, NP

## 2023-09-09 NOTE — Telephone Encounter (Signed)
 Left detailed VM and sent patient MyChart message

## 2023-09-10 ENCOUNTER — Telehealth: Payer: Self-pay

## 2023-09-10 NOTE — Telephone Encounter (Signed)
 Patient advised Can you please notify the patient that it is ok for him to proceed with starting plaquenil --sent to the pharmacy last week. Patient verbalized understanding.

## 2023-09-10 NOTE — Telephone Encounter (Signed)
-----   Message from Waddell CHRISTELLA Craze sent at 09/10/2023  8:03 AM EDT ----- Can you please notify the patient that it is ok for him to proceed with starting plaquenil --sent to the pharmacy last week. ----- Message ----- From: Miriam Norris, NP Sent: 09/09/2023  10:20 AM EDT To: Waddell CHRISTELLA Craze, PA-C; McKenzi P Schuh, PA-C; #  No problem at all.   Thanks! Will let him know. Tori, please let patient know that he can stop his Pletal  medication. I will also CC Waddell Craze, PA-C with Rheumatology.   Thanks!   Best, Norris Miriam, NP ----- Message ----- From: Elna Ahmed SQUIBB, PA-C Sent: 09/09/2023   9:50 AM EDT To: Norris Miriam, NP  Good morning,   I apologize I havent finished his note yet. But yes, he can d/c the pletal  for the RA meds. He should still continue his Plavix .   Thanks, Ahmed Elna PA-C ----- Message ----- From: Miriam Norris, NP Sent: 09/09/2023   9:49 AM EDT To: Ahmed SQUIBB Elna, PA-C  Hello McKenzi,   I am helping Waddell Craze, PA (Rheumatology) and have consulted cardiologist about his medications. Okay if we d/c Pletal  to adjust his meds to help his RA?  Thanks!   Best, Norris Miriam, NP

## 2023-09-10 NOTE — Telephone Encounter (Signed)
 Attempted to contact the patient and left a message to call the office back.

## 2023-09-11 ENCOUNTER — Other Ambulatory Visit: Payer: Self-pay | Admitting: *Deleted

## 2023-09-11 DIAGNOSIS — T82599A Other mechanical complication of unspecified cardiac and vascular devices and implants, initial encounter: Secondary | ICD-10-CM

## 2023-09-11 DIAGNOSIS — Z95828 Presence of other vascular implants and grafts: Secondary | ICD-10-CM

## 2023-09-11 DIAGNOSIS — I739 Peripheral vascular disease, unspecified: Secondary | ICD-10-CM

## 2023-09-16 HISTORY — PX: MOUTH SURGERY: SHX715

## 2023-09-18 ENCOUNTER — Ambulatory Visit: Payer: Self-pay | Admitting: Nurse Practitioner

## 2023-09-18 ENCOUNTER — Other Ambulatory Visit: Payer: Self-pay

## 2023-09-18 ENCOUNTER — Other Ambulatory Visit (HOSPITAL_COMMUNITY): Payer: Self-pay

## 2023-09-18 ENCOUNTER — Other Ambulatory Visit: Payer: Self-pay | Admitting: Physician Assistant

## 2023-09-18 DIAGNOSIS — M0609 Rheumatoid arthritis without rheumatoid factor, multiple sites: Secondary | ICD-10-CM

## 2023-09-18 DIAGNOSIS — Z79899 Other long term (current) drug therapy: Secondary | ICD-10-CM

## 2023-09-18 MED ORDER — XELJANZ 5 MG PO TABS
5.0000 mg | ORAL_TABLET | Freq: Every day | ORAL | 0 refills | Status: DC
  Filled 2023-09-18: qty 90, 90d supply, fill #0
  Filled 2023-09-26: qty 60, 60d supply, fill #0
  Filled 2023-11-18: qty 30, 30d supply, fill #1

## 2023-09-18 NOTE — Telephone Encounter (Signed)
 Last Fill: 07/25/2023  Labs: 07/03/2023 CBC stable.  Glucose is 191. Rest of CMP WNL ESR WNL CRP is slightly elevated.  TB Gold: 01/24/2023  TB gold negative   Next Visit: 10/20/2023  Last Visit: 09/02/2023  IK:Myzlfjunpi arthritis of multiple sites with negative rheumatoid factor   Current Dose per office note 09/02/2023: Xeljanz  5 mg 1 tablet by mouth daily   Okay to refill Xeljanz ?

## 2023-09-19 NOTE — Telephone Encounter (Signed)
 Patient called the office asking which medication was he advised to stop taking. Advised that per Almarie Crate, NP and Ahmed Elna [facility] he is to stop taking the Pletal . Verbalized understanding

## 2023-09-23 ENCOUNTER — Ambulatory Visit: Admitting: Podiatry

## 2023-09-23 ENCOUNTER — Encounter: Payer: Self-pay | Admitting: Podiatry

## 2023-09-23 DIAGNOSIS — B351 Tinea unguium: Secondary | ICD-10-CM | POA: Diagnosis not present

## 2023-09-23 DIAGNOSIS — M79674 Pain in right toe(s): Secondary | ICD-10-CM | POA: Diagnosis not present

## 2023-09-23 DIAGNOSIS — E1159 Type 2 diabetes mellitus with other circulatory complications: Secondary | ICD-10-CM | POA: Diagnosis not present

## 2023-09-23 DIAGNOSIS — M79675 Pain in left toe(s): Secondary | ICD-10-CM | POA: Diagnosis not present

## 2023-09-26 ENCOUNTER — Other Ambulatory Visit: Payer: Self-pay

## 2023-09-26 ENCOUNTER — Other Ambulatory Visit (HOSPITAL_COMMUNITY): Payer: Self-pay

## 2023-09-26 ENCOUNTER — Other Ambulatory Visit: Payer: Self-pay | Admitting: Pharmacy Technician

## 2023-09-26 NOTE — Progress Notes (Signed)
 Specialty Pharmacy Refill Coordination Note  [name] is a 73 y.o. male contacted today regarding refills of specialty medication(s) Tofacitinib  Citrate (Xeljanz )   Patient requested Delivery   Delivery date: 09/29/23   Verified address: [address]  [place] [address]   Medication will be filled on 09/26/23.

## 2023-09-26 NOTE — Progress Notes (Signed)
  Subjective:  Patient ID: [name], male    DOB: July 07, 1950,  MRN: [id number]  [name] presents to clinic today for at risk foot care. Pt has h/o NIDDM with PAD and painful thick toenails that are difficult to trim. Pain interferes with ambulation. Aggravating factors include wearing enclosed shoe gear. Pain is relieved with periodic professional debridement. Under care of Vascular Surgery for PAD. Chief Complaint  Patient presents with   Diabetes    Patient stated that his A1c is a 9 , his B.S is 125, he last seen his PCP 2 weeks ago , and his PCP name is Rosamond, Dhruv   New problem(s): None.   PCP is [name], MD.  Allergies  Allergen Reactions   Penicillins Hives    Review of Systems: Negative except as noted in the HPI.  Objective: No changes noted in today's physical examination. There were no vitals filed for this visit. [name] is a pleasant 73 y.o. male WD, WN in NAD. AAO x 3.  Vascular Examination: CFT <4 seconds b/l. DP pulses diminished b/l. PT pulses diminished b/l. Digital hair absent. Skin temperature gradient warm to cool b/l. No ischemia or gangrene. No cyanosis or clubbing noted b/l. No edema noted b/l LE.   Neurological Examination: Sensation grossly intact b/l with 10 gram monofilament.   Dermatological Examination: Pedal skin thin, shiny and atrophic b/l. No open wounds. No interdigital macerations.   Diffuse scaling noted peripherally and plantarly b/l feet.  No interdigital macerations.  No blisters, no weeping. No signs of secondary bacterial infection noted.  Toenails 1-5 b/l thick, discolored, elongated with subungual debris and pain on dorsal palpation.   No hyperkeratotic nor porokeratotic lesions present on today's visit.  Musculoskeletal Examination: Muscle strength 5/5 to all lower extremity muscle groups bilaterally. HAV with bunion deformity noted b/l LE. Hammertoe deformity noted 2-5 b/l.  Radiographs:  None  Assessment/Plan: 1. Pain due to onychomycosis of toenails of both feet   2. Type 2 diabetes mellitus with vascular disease [facility])   Consent given for treatment. Patient examined. All patient's and/or POA's questions/concerns addressed on today's visit. Toenails 1-5 debrided in length and girth without incident. Continue foot and shoe inspections daily. Monitor blood glucose per PCP/Endocrinologist's recommendations. Continue soft, supportive shoe gear daily. Report any pedal injuries to medical professional. Call office if there are any questions/concerns. -Patient/POA to call should there be question/concern in the interim.   Return in about 3 months (around 12/23/2023).  [name], DPM      [place] LOCATION: [address], [place] [address]                   Office [phone]   [facility] LOCATION: [address] [place], [place] [address] Office [phone]

## 2023-10-02 ENCOUNTER — Other Ambulatory Visit: Payer: Self-pay | Admitting: Nurse Practitioner

## 2023-10-06 DIAGNOSIS — I10 Essential (primary) hypertension: Secondary | ICD-10-CM | POA: Diagnosis not present

## 2023-10-06 DIAGNOSIS — Z299 Encounter for prophylactic measures, unspecified: Secondary | ICD-10-CM | POA: Diagnosis not present

## 2023-10-06 DIAGNOSIS — E119 Type 2 diabetes mellitus without complications: Secondary | ICD-10-CM | POA: Diagnosis not present

## 2023-10-06 DIAGNOSIS — M069 Rheumatoid arthritis, unspecified: Secondary | ICD-10-CM | POA: Diagnosis not present

## 2023-10-06 DIAGNOSIS — J449 Chronic obstructive pulmonary disease, unspecified: Secondary | ICD-10-CM | POA: Diagnosis not present

## 2023-10-07 DIAGNOSIS — I10 Essential (primary) hypertension: Secondary | ICD-10-CM | POA: Diagnosis not present

## 2023-10-07 DIAGNOSIS — E1165 Type 2 diabetes mellitus with hyperglycemia: Secondary | ICD-10-CM | POA: Diagnosis not present

## 2023-10-07 NOTE — Progress Notes (Unsigned)
 Office Visit Note  Patient: [name]             Date of Birth: 1950/12/03           MRN: [id number]             PCP: [name], MD Referring: [name], MD Visit Date: 10/20/2023 Occupation: Data Unavailable  Subjective:  Discuss starting plaquenil    History of Present Illness: [name] is a 73 y.o. male with history rheumatoid arthritis. Paitent is taking xeljanz  5 mg 1 tablet by mouth daily.  The plan was for the patient to initiate Plaquenil  after his last office visit.  He presents today to discuss the use of Plaquenil  and has several questions.  He was unsure if he was able to take Plaquenil  if he drinks on occasion.  Patient was also unsure if he would require an additional eye exam since he is already getting diabetic eye examinations. He continues to have chronic pain in both shoulders, left hip, both knee joints.  Patient is aware that he will require a hip replacement but has not scheduled the surgery.  Patient is considering proceeding with repeat knee joint cortisone injections to help alleviate his knee pain. Patient states that he is currently recovering from an upper respiratory tract infection.  Has been taking Mucinex as needed.    Activities of Daily Living:  Patient reports morning stiffness for 5-10 minutes.   Patient Reports nocturnal pain.  Difficulty dressing/grooming: Reports Difficulty climbing stairs: Reports Difficulty getting out of chair: Reports Difficulty using hands for taps, buttons, cutlery, and/or writing: Reports  Review of Systems  Constitutional:  Positive for fatigue.  HENT:  Positive for mouth dryness. Negative for mouth sores.   Eyes:  Positive for dryness.  Respiratory:  Positive for shortness of breath.   Cardiovascular:  Negative for chest pain and palpitations.  Gastrointestinal:  Positive for constipation. Negative for blood in stool and diarrhea.  Endocrine: Negative for increased urination.  Genitourinary:   Negative for involuntary urination.  Musculoskeletal:  Positive for joint pain, gait problem, joint pain, joint swelling, myalgias, muscle weakness, morning stiffness, muscle tenderness and myalgias.  Skin:  Negative for color change, rash, hair loss and sensitivity to sunlight.  Allergic/Immunologic: Negative for susceptible to infections.  Neurological:  Positive for dizziness. Negative for headaches.  Hematological:  Negative for swollen glands.  Psychiatric/Behavioral:  Positive for sleep disturbance. Negative for depressed mood. The patient is not nervous/anxious.     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   Opioid dependence (HCC) 10/15/2018   Other long term (current) drug therapy 10/15/2018   Diabetes mellitus type 2, controlled, without complications (HCC) 10/15/2018   Puncture wound of finger of left hand 08/08/2018   Puncture wound of finger, right, complicated, initial encounter 08/05/2018   DOE (dyspnea on exertion) 11/04/2017   Muscle cramps 07/04/2017   Contracture of left elbow 07/04/2017   High risk medication use 06/23/2017  Primary osteoarthritis of both hands 06/23/2017   Primary osteoarthritis of both feet 06/23/2017   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 factor (HCC) 06/23/2017   DDD (degenerative disc disease), cervical 06/23/2017   Rotator cuff syndrome of right shoulder  06/06/2017   History of  bilateral carpal tunnel release 06/06/2017   Ganglion cyst of wrist, left 06/06/2017   Non-insulin dependent type 2 diabetes mellitus (HCC) 06/06/2017   Chest pain with high risk for cardiac etiology 04/05/2017   COPD with acute exacerbation (HCC) 03/13/2017   Plantar fasciitis 02/21/2017   Iliac artery stenosis, right 02/12/2017   Black stool 02/10/2017   Abnormal CT of the abdomen 02/10/2017   Cervical spondylosis 10/11/2016   Bony growth 10/13/2015   Pain in joint, lower leg 03/02/2014   Discoloration of skin-Left dorsum foot 09/28/2013   Swelling of limb-Left Calf / Leg 09/28/2013   PVD (peripheral vascular disease) (HCC) 10/14/2012   Pain in limb- Left popliteal and calf 10/14/2012   Dyslipidemia, goal LDL below 70    Essential hypertension 03/05/2012   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 Leeann   Collagen vascular disease    COPD (chronic obstructive pulmonary disease) (HCC)    Coronary atherosclerosis of native coronary artery    Nonobstrtuctive by catheterization 2007, 2013, 2021   DVT (deep venous thrombosis) (HCC)    Essential hypertension    GERD (gastroesophageal reflux disease)    Hyperlipidemia    PAD (peripheral artery disease)    s/p left fem-pop bypass 2007; PTA and stenting of right iliac artery 09/2011   Rheumatoid arthritis(714.0)    Type 2 diabetes mellitus (HCC)      Family History  Problem Relation Age of Onset   Heart attack Mother 45   Deep vein thrombosis Mother    Diabetes Mother    Hyperlipidemia Mother    Hypertension Mother    Peripheral vascular disease Mother    Stroke Father    Diabetes Father    Hyperlipidemia Father    Hypertension Father    Peripheral vascular disease Father    Diabetes Sister    Hyperlipidemia Sister    Hypertension Sister    Diabetes Brother    Hyperlipidemia Brother    Cancer Brother    Deep vein thrombosis Brother    Hypertension Brother    Peripheral vascular disease Brother    Diabetes Daughter    Hyperlipidemia Son    Cancer Son    Hypertension Son    Colon cancer Neg Hx    Past Surgical History:  Procedure Laterality Date   ABDOMINAL AORTAGRAM N/A 10/03/2011   Procedure: ABDOMINAL EZELLA;  Surgeon: Redell LITTIE Door, MD;  Location: [facility] CATH LAB;  Service: Cardiovascular;  Laterality: N/A;   ABDOMINAL AORTOGRAM W/LOWER EXTREMITY N/A 01/16/2017   Procedure: ABDOMINAL AORTOGRAM W/LOWER EXTREMITY;  Surgeon: Door Redell LITTIE, MD;  Location: [facility] INVASIVE CV LAB;  Service: Cardiovascular;  Laterality: N/A;   ABDOMINAL AORTOGRAM W/LOWER EXTREMITY Bilateral 08/04/2019   Procedure: ABDOMINAL AORTOGRAM W/LOWER EXTREMITY;  Surgeon: Gretta Lonni PARAS, MD;  Location: [facility] INVASIVE CV LAB;  Service: Cardiovascular;  Laterality: Bilateral;   ABDOMINAL AORTOGRAM W/LOWER EXTREMITY N/A 03/09/2020   Procedure: ABDOMINAL AORTOGRAM W/LOWER EXTREMITY;  Surgeon: Gretta Lonni PARAS, MD;  Location: [facility] INVASIVE CV LAB;  Service: Cardiovascular;  Laterality: N/A;   ABDOMINAL AORTOGRAM W/LOWER EXTREMITY N/A 12/02/2022   Procedure: ABDOMINAL AORTOGRAM W/LOWER EXTREMITY;  Surgeon: Pearline Norman RAMAN, MD;  Location: [facility] INVASIVE CV LAB;  Service: Cardiovascular;  Laterality: N/A;   BIOPSY N/A 12/28/2021   Procedure: BIOPSY;  Surgeon: Cindie Carlin POUR, DO;  Location: [facility] ENDO SUITE;  Service: Endoscopy;  Laterality: N/A;   CATARACT  EXTRACTION W/PHACO Right 04/18/2023   Procedure: PHACOEMULSIFICATION, CATARACT, WITH IOL INSERTION;  Surgeon: Juli Blunt, MD;  Location: [facility] ORS;  Service: Ophthalmology;  Laterality: Right;  CDE: 1.81   CATARACT EXTRACTION W/PHACO Left 05/02/2023   Procedure: PHACOEMULSIFICATION, CATARACT, WITH IOL INSERTION;  Surgeon: Juli Blunt, MD;  Location: [facility] ORS;  Service: Ophthalmology;  Laterality: Left;  CDE: 1.17   CERVICAL DISC SURGERY     COLONOSCOPY  2013   Hyperplastic polyp   COLONOSCOPY WITH PROPOFOL  N/A 02/17/2017   Surgeon: Shaaron Lamar HERO, MD; Many small and large mouth diverticula in the sigmoid, descending, transverse colon, internal hemorrhoids that were medium sized and grade 1.   COLONOSCOPY WITH PROPOFOL  N/A 12/28/2021   Surgeon: Cindie Carlin POUR, DO;  Diverticulosis in the sigmoid colon and descending colon.  Likely benign tumor in the ascending colon.   ESOPHAGOGASTRODUODENOSCOPY (EGD) WITH PROPOFOL  N/A 12/28/2021   Procedure: ESOPHAGOGASTRODUODENOSCOPY (EGD) WITH PROPOFOL ;  Surgeon: Cindie Carlin POUR, DO;  Location: [facility];  Service: Endoscopy;  Laterality: N/A;   FEMORAL-POPLITEAL BYPASS GRAFT  11/06/2005   ILIAC ARTERY STENT  10/03/2011   Right CIA stenting   INSERTION, STENT, DRUG-ELUTING, LACRIMAL CANALICULUS Right 04/18/2023   Procedure: INSERTION, STENT, DRUG-ELUTING, LACRIMAL CANALICULUS;  Surgeon: Juli Blunt, MD;  Location: [facility] ORS;  Service: Ophthalmology;  Laterality: Right;   INSERTION, STENT, DRUG-ELUTING, LACRIMAL CANALICULUS Left 05/02/2023   Procedure: INSERTION, STENT, DRUG-ELUTING, LACRIMAL CANALICULUS;  Surgeon: Juli Blunt, MD;  Location: [facility] ORS;  Service: Ophthalmology;  Laterality: Left;   LEFT HEART CATH AND CORONARY ANGIOGRAPHY N/A 04/14/2019   Procedure: LEFT HEART CATH AND CORONARY ANGIOGRAPHY;  Surgeon: Burnard Debby LABOR, MD;  Location: [facility] INVASIVE CV LAB;  Service: Cardiovascular;  Laterality: N/A;   MOUTH SURGERY Right  09/16/2023   had to cut into the jaw   PERIPHERAL VASCULAR BALLOON ANGIOPLASTY Right 01/16/2017   Procedure: PERIPHERAL VASCULAR BALLOON ANGIOPLASTY;  Surgeon: Laurence Redell CROME, MD;  Location: [facility] INVASIVE CV LAB;  Service: Cardiovascular;  Laterality: Right;  common iliac    PERIPHERAL VASCULAR BALLOON ANGIOPLASTY  03/09/2020   Procedure: PERIPHERAL VASCULAR BALLOON ANGIOPLASTY;  Surgeon: Gretta Lonni PARAS, MD;  Location: [facility] INVASIVE CV LAB;  Service: Cardiovascular;;   PR VEIN BYPASS GRAFT,AORTO-FEM-POP  06/22/2010   Redo Left Fem-Pop   Repair of left arm fracture     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      Immunization History  Administered Date(s) Administered   INFLUENZA, HIGH DOSE SEASONAL PF 10/14/2016, 09/21/2018   Influenza-Unspecified 11/08/2008, 12/10/2019   Moderna Sars-Covid-2 Vaccination 02/19/2019, 03/22/2019, 11/23/2019   PPD Test 09/22/2007   Td (Adult),5 Lf Tetanus Toxid, Preservative Free 02/09/2001   Tdap 02/09/2001, 08/05/2018     Objective: Vital Signs: BP 120/82   Pulse (!) 106   Temp 98.4 F (36.9 C)   Resp 17   Ht 5' 9 (1.753 m)   Wt 161 lb (73 kg)   BMI 23.78 kg/m    Physical Exam Vitals and nursing note reviewed.  Constitutional:      Appearance: He is well-developed.  HENT:     Head: Normocephalic and atraumatic.  Eyes:     Conjunctiva/sclera: Conjunctivae normal.     Pupils: Pupils are equal, round, and  reactive to light.  Cardiovascular:     Rate and Rhythm: Normal rate and regular rhythm.     Heart sounds: Normal heart sounds.  Pulmonary:     Effort: Pulmonary effort is normal.     Breath sounds: Normal breath sounds.  Abdominal:     General: Bowel sounds are normal.     Palpations: Abdomen is soft.  Musculoskeletal:     Cervical back: Normal range of motion and neck supple.  Skin:    General: Skin is warm and dry.     Capillary Refill: Capillary refill takes less than 2 seconds.  Neurological:     Mental Status: He is alert and oriented to person, place, and time.  Psychiatric:        Behavior: Behavior normal.      Musculoskeletal Exam: C-spine has limited range of motion bilateral rotation.  Discomfort and limitation with range of motion of both shoulders especially the left shoulder.  Flexion tractors of both elbows.  Limited range of motion of both wrist joints.  Synovial thickening of all MCP joints but no active synovitis was noted.  PIP and DIP thickening noted.  Limited extension of PIP joints.  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  Severely limited range of motion of the left hip.  Discomfort range of motion of both knees but no effusion noted.  Ankle joints have good range of motion with no tenderness or synovitis.  CDAI Exam: CDAI Score: -- Patient Global: --; Provider Global: -- Swollen: --; Tender: -- Joint Exam 10/20/2023   No joint exam has been documented for this visit   There is currently no information documented on the homunculus. Go to the Rheumatology activity and complete the homunculus joint exam.  Investigation: No additional findings.  Imaging: No results found.  Recent Labs: Lab Results  Component Value Date   WBC 7.6 07/03/2023   HGB 13.4 07/03/2023   PLT 331 07/03/2023   NA 139 07/03/2023   K 4.1 07/03/2023   CL 102 07/03/2023   CO2 27 07/03/2023   GLUCOSE 191 (H) 07/03/2023   BUN 12 07/03/2023   CREATININE 1.07  07/03/2023   BILITOT 0.4 07/03/2023   ALKPHOS 79 01/25/2021   AST 20 07/03/2023   ALT 20 07/03/2023   PROT 7.6  07/03/2023   ALBUMIN 4.5 01/25/2021   CALCIUM  9.1 07/03/2023   GFRAA 83 01/27/2020   QFTBGOLDPLUS NEGATIVE 01/24/2023    Speciality Comments: Prior therapy includes: methotrexate ( d/c alcohol use), Arava (d/c patient preference), [place] (patient declined)  Procedures:  No procedures performed Allergies: Penicillins     Assessment / Plan:     Visit Diagnoses: 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  He continues to have discomfort and stiffness involving both shoulders, left hip, and both knee joints.  He experiences occasional nocturnal pain in the left wrist.  He is taking Xeljanz  5 mg 1 tablet by mouth daily.  He continues to tolerate Xeljanz  without any side effects and has not had any recent gaps in therapy.  The plan after his last office visit was to initiate Plaquenil  pending cardiology approval.  He was cleared to initiate Plaquenil  but has not yet started Plaquenil  due to having several questions and concerns.  Reviewed indications, contraindications, and potential side effects of Plaquenil  today in detail.  He is comfortable initiating Plaquenil  with close monitoring.  He will take Plaquenil  200 mg 1 tablet by mouth twice daily Monday through Friday and remain on Xeljanz  5 mg 1 tablet by mouth daily.  He will notify us  if he cannot tolerate it taking combination therapy.  He will follow-up in the office in 8 weeks to assess his response.  High risk medication use - Xeljanz  5 mg 1 tablet by mouth daily and plaquenil  200 mg 1 tablet by mouth by mouth twice daily Monday through Friday.  CBC and CMP updated on 07/03/23. Orders for CBC and CMP released today.   No baseline plaquenil  eye exam on file.  TB gold negative on 01/24/23.   - Plan: CBC with Differential/Platelet,  Comprehensive metabolic panel with GFR  Rotator cuff syndrome of right shoulder: Slightly limited range of motion of both shoulders with internal rotation.  He continues to experience chronic pain in both shoulders.  Contracture of joint of both elbows: Flexion contractures of both elbows noted.  Primary osteoarthritis of both hands: Limited range of motion of both wrist joints.  Synovial thickening of all MCP joints.  PIP and DIP thickening noted.  No synovitis noted on examination today.  History of  bilateral carpal tunnel release: Not currently symptomatic.  Primary osteoarthritis of left hip - X-rays of the left hip are consistent with moderate to severe arthritis.  He is aware that he will require surgical intervention but has not yet scheduled a total hip arthroplasty.  Chronic pain of left knee: Patient continues to have chronic pain in both knee joints.  No effusion noted on exam.  Primary osteoarthritis of both feet - X-rays of the right foot updated 06/20/2023: Severe arthritis of the first metatarsal phalangeal joint, favored to be secondary to osteoarthritis.  DDD (degenerative disc disease), cervical: C-spine has limited ROM with lateral rotation.    Other medical conditions are listed as follows:  Palpitations  Mixed hyperlipidemia: Lipid panel within normal limits on 01/24/2023.  Essential hypertension: Blood pressure was 120/82 today in the office.  COPD  GOLD II  Atherosclerosis of native coronary artery of native heart without angina pectoris  History of diabetes mellitus  PVD (peripheral vascular disease)  Secondary cardiomyopathy (HCC)  Orders: Orders Placed This Encounter  Procedures   CBC with Differential/Platelet   Comprehensive metabolic panel with GFR   No orders of the defined types  were placed in this encounter.  Follow-Up Instructions: Return in about 8 weeks (around 12/15/2023) for Rheumatoid arthritis.   Waddell CHRISTELLA Craze, PA-C  Note - This  record has been created using Dragon software.  Chart creation errors have been sought, but may not always  have been located. Such creation errors do not reflect on  the standard of medical care.

## 2023-10-14 DIAGNOSIS — Z9841 Cataract extraction status, right eye: Secondary | ICD-10-CM | POA: Diagnosis not present

## 2023-10-14 DIAGNOSIS — H04123 Dry eye syndrome of bilateral lacrimal glands: Secondary | ICD-10-CM | POA: Diagnosis not present

## 2023-10-14 DIAGNOSIS — H16223 Keratoconjunctivitis sicca, not specified as Sjogren's, bilateral: Secondary | ICD-10-CM | POA: Diagnosis not present

## 2023-10-14 DIAGNOSIS — H11823 Conjunctivochalasis, bilateral: Secondary | ICD-10-CM | POA: Diagnosis not present

## 2023-10-14 DIAGNOSIS — Z9842 Cataract extraction status, left eye: Secondary | ICD-10-CM | POA: Diagnosis not present

## 2023-10-17 NOTE — Telephone Encounter (Signed)
 1. Which medications need to be refilled? (please list name of each medication and dose if known)    metoprolol  succinate (TOPROL  XL) 25 MG 24 hr tablet  2. Which pharmacy/location (including street and city if local pharmacy) is medication to be sent to?  [facility] [place]  3. Do they need a 30 day or 90 day supply?   90  PT IS COMPLETELY OUT OF THIS MEDICATION AND NEEDS IT SENT TO THE PHARMACY AS SOON AS POSSIBLE

## 2023-10-20 ENCOUNTER — Ambulatory Visit: Attending: Physician Assistant | Admitting: Physician Assistant

## 2023-10-20 ENCOUNTER — Encounter: Payer: Self-pay | Admitting: Physician Assistant

## 2023-10-20 VITALS — BP 120/82 | HR 106 | Temp 98.4°F | Resp 17 | Ht 69.0 in | Wt 161.0 lb

## 2023-10-20 DIAGNOSIS — M24522 Contracture, left elbow: Secondary | ICD-10-CM

## 2023-10-20 DIAGNOSIS — G8929 Other chronic pain: Secondary | ICD-10-CM

## 2023-10-20 DIAGNOSIS — M19072 Primary osteoarthritis, left ankle and foot: Secondary | ICD-10-CM

## 2023-10-20 DIAGNOSIS — M19041 Primary osteoarthritis, right hand: Secondary | ICD-10-CM | POA: Diagnosis not present

## 2023-10-20 DIAGNOSIS — M19071 Primary osteoarthritis, right ankle and foot: Secondary | ICD-10-CM | POA: Diagnosis not present

## 2023-10-20 DIAGNOSIS — M0609 Rheumatoid arthritis without rheumatoid factor, multiple sites: Secondary | ICD-10-CM | POA: Diagnosis not present

## 2023-10-20 DIAGNOSIS — Z79899 Other long term (current) drug therapy: Secondary | ICD-10-CM | POA: Diagnosis not present

## 2023-10-20 DIAGNOSIS — I739 Peripheral vascular disease, unspecified: Secondary | ICD-10-CM

## 2023-10-20 DIAGNOSIS — I2510 Atherosclerotic heart disease of native coronary artery without angina pectoris: Secondary | ICD-10-CM

## 2023-10-20 DIAGNOSIS — E782 Mixed hyperlipidemia: Secondary | ICD-10-CM

## 2023-10-20 DIAGNOSIS — M1612 Unilateral primary osteoarthritis, left hip: Secondary | ICD-10-CM

## 2023-10-20 DIAGNOSIS — Z9889 Other specified postprocedural states: Secondary | ICD-10-CM | POA: Diagnosis not present

## 2023-10-20 DIAGNOSIS — R002 Palpitations: Secondary | ICD-10-CM

## 2023-10-20 DIAGNOSIS — M25562 Pain in left knee: Secondary | ICD-10-CM | POA: Diagnosis not present

## 2023-10-20 DIAGNOSIS — M24521 Contracture, right elbow: Secondary | ICD-10-CM

## 2023-10-20 DIAGNOSIS — I10 Essential (primary) hypertension: Secondary | ICD-10-CM

## 2023-10-20 DIAGNOSIS — M5030 Other cervical disc degeneration, unspecified cervical region: Secondary | ICD-10-CM | POA: Diagnosis not present

## 2023-10-20 DIAGNOSIS — M75101 Unspecified rotator cuff tear or rupture of right shoulder, not specified as traumatic: Secondary | ICD-10-CM | POA: Diagnosis not present

## 2023-10-20 DIAGNOSIS — M19042 Primary osteoarthritis, left hand: Secondary | ICD-10-CM

## 2023-10-20 DIAGNOSIS — Z8639 Personal history of other endocrine, nutritional and metabolic disease: Secondary | ICD-10-CM

## 2023-10-20 DIAGNOSIS — I429 Cardiomyopathy, unspecified: Secondary | ICD-10-CM

## 2023-10-20 DIAGNOSIS — J449 Chronic obstructive pulmonary disease, unspecified: Secondary | ICD-10-CM

## 2023-10-21 ENCOUNTER — Ambulatory Visit: Payer: Self-pay | Admitting: Physician Assistant

## 2023-10-21 DIAGNOSIS — R0981 Nasal congestion: Secondary | ICD-10-CM | POA: Diagnosis not present

## 2023-10-21 DIAGNOSIS — J449 Chronic obstructive pulmonary disease, unspecified: Secondary | ICD-10-CM | POA: Diagnosis not present

## 2023-10-21 DIAGNOSIS — E1165 Type 2 diabetes mellitus with hyperglycemia: Secondary | ICD-10-CM | POA: Diagnosis not present

## 2023-10-21 DIAGNOSIS — J441 Chronic obstructive pulmonary disease with (acute) exacerbation: Secondary | ICD-10-CM | POA: Diagnosis not present

## 2023-10-21 DIAGNOSIS — Z299 Encounter for prophylactic measures, unspecified: Secondary | ICD-10-CM | POA: Diagnosis not present

## 2023-10-21 LAB — COMPREHENSIVE METABOLIC PANEL WITH GFR
AG Ratio: 1.3 (calc) (ref 1.0–2.5)
ALT: 14 U/L (ref 9–46)
AST: 14 U/L (ref 10–35)
Albumin: 4.1 g/dL (ref 3.6–5.1)
Alkaline phosphatase (APISO): 95 U/L (ref 35–144)
BUN: 11 mg/dL (ref 7–25)
CO2: 27 mmol/L (ref 20–32)
Calcium: 9 mg/dL (ref 8.6–10.3)
Chloride: 102 mmol/L (ref 98–110)
Creat: 0.89 mg/dL (ref 0.70–1.28)
Globulin: 3.2 g/dL (ref 1.9–3.7)
Glucose, Bld: 251 mg/dL — ABNORMAL HIGH (ref 65–139)
Potassium: 4 mmol/L (ref 3.5–5.3)
Sodium: 139 mmol/L (ref 135–146)
Total Bilirubin: 0.5 mg/dL (ref 0.2–1.2)
Total Protein: 7.3 g/dL (ref 6.1–8.1)
eGFR: 91 mL/min/1.73m2 (ref >=60)

## 2023-10-21 LAB — CBC WITH DIFFERENTIAL/PLATELET
Absolute Lymphocytes: 957 {cells}/uL (ref 850–3900)
Absolute Monocytes: 662 {cells}/uL (ref 200–950)
Basophils Absolute: 28 {cells}/uL (ref 0–200)
Basophils Relative: 0.3 %
Eosinophils Absolute: 92 {cells}/uL (ref 15–500)
Eosinophils Relative: 1 %
HCT: 42.7 % (ref 38.5–50.0)
Hemoglobin: 13.6 g/dL (ref 13.2–17.1)
MCH: 28.2 pg (ref 27.0–33.0)
MCHC: 31.9 g/dL — ABNORMAL LOW (ref 32.0–36.0)
MCV: 88.4 fL (ref 80.0–100.0)
MPV: 10.6 fL (ref 7.5–12.5)
Monocytes Relative: 7.2 %
Neutro Abs: 7461 {cells}/uL (ref 1500–7800)
Neutrophils Relative %: 81.1 %
Platelets: 275 Thousand/uL (ref 140–400)
RBC: 4.83 Million/uL (ref 4.20–5.80)
RDW: 15.4 % — ABNORMAL HIGH (ref 11.0–15.0)
Total Lymphocyte: 10.4 %
WBC: 9.2 Thousand/uL (ref 3.8–10.8)

## 2023-10-21 NOTE — Progress Notes (Signed)
 Glucose is elevated-251, rest of CMP WNL CBC stable.

## 2023-10-29 DIAGNOSIS — M069 Rheumatoid arthritis, unspecified: Secondary | ICD-10-CM | POA: Diagnosis not present

## 2023-10-29 DIAGNOSIS — Z79891 Long term (current) use of opiate analgesic: Secondary | ICD-10-CM | POA: Diagnosis not present

## 2023-10-30 DIAGNOSIS — J441 Chronic obstructive pulmonary disease with (acute) exacerbation: Secondary | ICD-10-CM | POA: Diagnosis not present

## 2023-10-30 DIAGNOSIS — R058 Other specified cough: Secondary | ICD-10-CM | POA: Diagnosis not present

## 2023-10-30 DIAGNOSIS — R070 Pain in throat: Secondary | ICD-10-CM | POA: Diagnosis not present

## 2023-10-30 DIAGNOSIS — Z299 Encounter for prophylactic measures, unspecified: Secondary | ICD-10-CM | POA: Diagnosis not present

## 2023-10-30 DIAGNOSIS — R051 Acute cough: Secondary | ICD-10-CM | POA: Diagnosis not present

## 2023-11-03 DIAGNOSIS — M961 Postlaminectomy syndrome, not elsewhere classified: Secondary | ICD-10-CM | POA: Diagnosis not present

## 2023-11-03 DIAGNOSIS — M4802 Spinal stenosis, cervical region: Secondary | ICD-10-CM | POA: Diagnosis not present

## 2023-11-10 ENCOUNTER — Encounter: Payer: Self-pay | Admitting: Radiology

## 2023-11-13 ENCOUNTER — Other Ambulatory Visit: Payer: Self-pay

## 2023-11-18 ENCOUNTER — Other Ambulatory Visit: Payer: Self-pay

## 2023-11-20 ENCOUNTER — Other Ambulatory Visit: Payer: Self-pay

## 2023-11-24 ENCOUNTER — Other Ambulatory Visit: Payer: Self-pay

## 2023-11-24 ENCOUNTER — Other Ambulatory Visit: Payer: Self-pay | Admitting: Pharmacy Technician

## 2023-11-24 NOTE — Progress Notes (Signed)
 Specialty Pharmacy Refill Coordination Note  [name] is a 73 y.o. male contacted today regarding refills of specialty medication(s) Tofacitinib  Citrate (Xeljanz )   Patient requested Delivery   Delivery date: 11/27/23   Verified address: [address]  [place]   Medication will be filled on: 11/26/23

## 2023-11-25 ENCOUNTER — Other Ambulatory Visit: Payer: Self-pay

## 2023-11-26 ENCOUNTER — Encounter: Payer: Self-pay | Admitting: Gastroenterology

## 2023-11-26 ENCOUNTER — Other Ambulatory Visit: Payer: Self-pay | Admitting: Internal Medicine

## 2023-12-08 NOTE — Progress Notes (Unsigned)
 Office Visit Note  Patient: [name]             Date of Birth: Nov 14, 1950           MRN: [id number]             PCP: [name], MD Referring: [name], MD Visit Date: 12/09/2023 Occupation: Data Unavailable  Subjective:  Medication monitoring   History of Present Illness: [name] is a 73 y.o. male with history of rheumatoid arthritis.  Patient remains on  Xeljanz  5 mg 1 tablet by mouth daily and plaquenil  200 mg 1 tablet by mouth by mouth twice daily Monday through Friday. Patient has tolerated Plaquenil  since initiating after the last office visit on 10/20/2023.  Patient states that within 2 weeks he noticed relief of joint pain and joint stiffness.  Patient states that he has noticed some increased discomfort in the cervical spine and has had intermittent numbness to the left shoulder for the past 1 month.  He denies any injury prior to the onset of symptoms.  He has had a cervical spinal fusion in the past but has not yet followed back up with his spine specialist.  Patient continues to have discomfort in the right foot especially in the right third toe.  Patient states that he has not yet scheduled his left hip replacement since his symptoms have been manageable. He denies any recent gaps in therapy.  He denies any recent or recurrent infections.  He denies any new medical conditions.  Activities of Daily Living:  Patient reports morning stiffness for 5 minutes.   Patient Reports nocturnal pain.  Difficulty dressing/grooming: Reports Difficulty climbing stairs: Reports Difficulty getting out of chair: Reports Difficulty using hands for taps, buttons, cutlery, and/or writing: Reports  Review of Systems  Constitutional:  Positive for fatigue.  HENT:  Positive for mouth dryness. Negative for mouth sores.   Eyes:  Positive for dryness.  Respiratory:  Positive for shortness of breath.   Cardiovascular:  Positive for chest pain and palpitations.  Gastrointestinal:   Positive for constipation. Negative for blood in stool and diarrhea.  Endocrine: Positive for increased urination.  Genitourinary:  Negative for involuntary urination.  Musculoskeletal:  Positive for joint pain, gait problem, joint pain, joint swelling, muscle weakness, morning stiffness and muscle tenderness. Negative for myalgias and myalgias.  Skin:  Positive for color change. Negative for rash, hair loss and sensitivity to sunlight.  Allergic/Immunologic: Negative for susceptible to infections.  Neurological:  Positive for headaches. Negative for dizziness.  Hematological:  Negative for swollen glands.  Psychiatric/Behavioral:  Positive for sleep disturbance. Negative for depressed mood. The patient is not nervous/anxious.     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   Opioid dependence (HCC) 10/15/2018   Other long term (current) drug therapy 10/15/2018   Diabetes mellitus type 2, controlled, without complications (HCC) 10/15/2018   Puncture wound of finger of left hand 08/08/2018   Puncture wound of  finger, right, complicated, initial encounter 08/05/2018   DOE (dyspnea on exertion) 11/04/2017   Muscle cramps 07/04/2017   Contracture of left elbow 07/04/2017   High risk medication use 06/23/2017   Primary osteoarthritis of both hands 06/23/2017   Primary osteoarthritis of both feet 06/23/2017   Rheumatoid arthritis of  multiple sites with negative rheumatoid factor (HCC) 06/23/2017   DDD (degenerative disc disease), cervical 06/23/2017   Rotator cuff syndrome of right shoulder 06/06/2017   History of  bilateral carpal tunnel release 06/06/2017   Ganglion cyst of wrist, left 06/06/2017   Non-insulin dependent type 2 diabetes mellitus (HCC) 06/06/2017   Chest pain with high risk for cardiac etiology 04/05/2017   COPD with acute exacerbation (HCC) 03/13/2017   Plantar fasciitis 02/21/2017   Iliac artery stenosis, right 02/12/2017   Black stool 02/10/2017   Abnormal CT of the abdomen 02/10/2017   Cervical spondylosis 10/11/2016   Bony growth 10/13/2015   Pain in joint, lower leg 03/02/2014   Discoloration of skin-Left dorsum foot 09/28/2013   Swelling of limb-Left Calf / Leg 09/28/2013   PVD (peripheral vascular disease) (HCC) 10/14/2012   Pain in limb- Left popliteal and calf 10/14/2012   Dyslipidemia, goal LDL below 70    Essential hypertension 03/05/2012   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 Leeann   Collagen vascular disease    COPD (chronic obstructive pulmonary disease) (HCC)    Coronary atherosclerosis of native coronary artery    Nonobstrtuctive by catheterization 2007, 2013, 2021   DVT (deep venous thrombosis) (HCC)    Essential hypertension    GERD (gastroesophageal reflux disease)    Hyperlipidemia    PAD (peripheral  artery disease)    s/p left fem-pop bypass 2007; PTA and stenting of right iliac artery 09/2011   Rheumatoid arthritis(714.0)    Type 2 diabetes mellitus (HCC)     Family History  Problem Relation Age of Onset   Heart attack Mother 32   Deep vein thrombosis Mother    Diabetes Mother    Hyperlipidemia Mother    Hypertension Mother    Peripheral vascular disease Mother    Stroke Father    Diabetes Father    Hyperlipidemia Father    Hypertension Father    Peripheral vascular disease Father    Diabetes Sister    Hyperlipidemia Sister    Hypertension Sister    Diabetes Brother    Hyperlipidemia Brother    Cancer Brother    Deep vein thrombosis Brother    Hypertension Brother    Peripheral vascular disease Brother    Diabetes Daughter    Hyperlipidemia Son    Cancer Son    Hypertension Son    Colon cancer Neg Hx    Past Surgical History:  Procedure Laterality Date   ABDOMINAL AORTAGRAM N/A 10/03/2011   Procedure: ABDOMINAL EZELLA;  Surgeon: Redell LITTIE Door, MD;  Location: [facility] CATH LAB;  Service:  Cardiovascular;  Laterality: N/A;   ABDOMINAL AORTOGRAM W/LOWER EXTREMITY N/A 01/16/2017   Procedure: ABDOMINAL AORTOGRAM W/LOWER EXTREMITY;  Surgeon: Laurence Redell CROME, MD;  Location: [facility] INVASIVE CV LAB;  Service: Cardiovascular;  Laterality: N/A;   ABDOMINAL AORTOGRAM W/LOWER EXTREMITY Bilateral 08/04/2019   Procedure: ABDOMINAL AORTOGRAM W/LOWER EXTREMITY;  Surgeon: Gretta Lonni PARAS, MD;  Location: [facility] INVASIVE CV LAB;  Service: Cardiovascular;  Laterality: Bilateral;   ABDOMINAL AORTOGRAM W/LOWER EXTREMITY N/A 03/09/2020   Procedure: ABDOMINAL AORTOGRAM W/LOWER EXTREMITY;  Surgeon: Gretta Lonni PARAS, MD;  Location: [facility] INVASIVE CV LAB;  Service: Cardiovascular;  Laterality: N/A;   ABDOMINAL AORTOGRAM W/LOWER EXTREMITY N/A 12/02/2022   Procedure: ABDOMINAL AORTOGRAM W/LOWER EXTREMITY;  Surgeon: Pearline Norman RAMAN, MD;  Location: [facility] INVASIVE CV LAB;  Service: Cardiovascular;  Laterality:  N/A;   BIOPSY N/A 12/28/2021   Procedure: BIOPSY;  Surgeon: Cindie Carlin POUR, DO;  Location: [facility] ENDO SUITE;  Service: Endoscopy;  Laterality: N/A;   CATARACT EXTRACTION W/PHACO Right 04/18/2023   Procedure: PHACOEMULSIFICATION, CATARACT, WITH IOL INSERTION;  Surgeon: Juli Blunt, MD;  Location: [facility] ORS;  Service: Ophthalmology;  Laterality: Right;  CDE: 1.81   CATARACT EXTRACTION W/PHACO Left 05/02/2023   Procedure: PHACOEMULSIFICATION, CATARACT, WITH IOL INSERTION;  Surgeon: Juli Blunt, MD;  Location: [facility] ORS;  Service: Ophthalmology;  Laterality: Left;  CDE: 1.17   CERVICAL DISC SURGERY     COLONOSCOPY  2013   Hyperplastic polyp   COLONOSCOPY WITH PROPOFOL  N/A 02/17/2017   Surgeon: Shaaron Lamar HERO, MD; Many small and large mouth diverticula in the sigmoid, descending, transverse colon, internal hemorrhoids that were medium sized and grade 1.   COLONOSCOPY WITH PROPOFOL  N/A 12/28/2021   Surgeon: Cindie Carlin POUR, DO;  Diverticulosis in the sigmoid colon and descending colon.  Likely benign tumor in the ascending colon.   ESOPHAGOGASTRODUODENOSCOPY (EGD) WITH PROPOFOL  N/A 12/28/2021   Procedure: ESOPHAGOGASTRODUODENOSCOPY (EGD) WITH PROPOFOL ;  Surgeon: Cindie Carlin POUR, DO;  Location: [facility] END[facility] SU[facility];  Service: Endoscopy;  Laterality: N/A;   FEMORAL-POPLITEAL BYPASS GRAFT  11/06/2005   ILIAC ARTERY STENT  10/03/2011   Right CIA stenting   INSERTION, STENT, DRUG-ELUTING, LACRIMAL CANALICULUS Right 04/18/2023   Procedure: INSERTION, STENT, DRUG-ELUTING, LACRIMAL CANALICULUS;  Surgeon: Juli Blunt, MD;  Location: [facility] ORS;  Service: Ophthalmology;  Laterality: Right;   INSERTION, STENT, DRUG-ELUTING, LACRIMAL CANALICULUS Left 05/02/2023   Procedure: INSERTION, STENT, DRUG-ELUTING, LACRIMAL CANALICULUS;  Surgeon: Juli Blunt, MD;  Location: [facility] ORS;  Service: Ophthalmology;  Laterality: Left;   LEFT HEART CATH AND CORONARY ANGIOGRAPHY N/A 04/14/2019   Procedure: LEFT HEART  CATH AND CORONARY ANGIOGRAPHY;  Surgeon: Burnard Debby LABOR, MD;  Location: [facility] INVASIVE CV LAB;  Service: Cardiovascular;  Laterality: N/A;   MOUTH SURGERY Right 09/16/2023   had to cut into the jaw   PERIPHERAL VASCULAR BALLOON ANGIOPLASTY Right 01/16/2017   Procedure: PERIPHERAL VASCULAR BALLOON ANGIOPLASTY;  Surgeon: Laurence Redell CROME, MD;  Location: 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      Immunization History  Administered Date(s) Administered   INFLUENZA, HIGH DOSE SEASONAL PF 10/14/2016, 09/21/2018   Influenza-Unspecified 11/08/2008, 12/10/2019   Moderna Sars-Covid-2 Vaccination 02/19/2019, 03/22/2019, 11/23/2019   PPD Test 09/22/2007   Td (Adult),5 Lf Tetanus Toxid, Preservative Free 02/09/2001   Tdap 02/09/2001, 08/05/2018     Objective: Vital Signs: BP 112/79 (BP Location: Left Arm, Patient Position: Sitting, Cuff Size: Small)   Pulse 79   Temp 97.9 F (36.6 C)   Resp 14   Ht 5' 9 (1.753 m)   Wt 166 lb 3.2 oz (75.4 kg)   BMI 24.54 kg/m    Physical  Exam Vitals and nursing note reviewed.  Constitutional:      Appearance: He is well-developed.  HENT:     Head: Normocephalic and atraumatic.  Eyes:     Conjunctiva/sclera: Conjunctivae normal.     Pupils: Pupils are equal, round, and reactive to light.  Cardiovascular:     Rate and Rhythm: Normal rate and regular rhythm.     Heart sounds: Normal heart sounds.  Pulmonary:     Effort: Pulmonary effort is normal.     Breath sounds: Normal breath sounds.  Abdominal:     General: Bowel sounds are normal.     Palpations: Abdomen is soft.  Musculoskeletal:     Cervical back: Normal range of motion and neck supple.  Skin:    General: Skin is warm and dry.     Capillary Refill: Capillary refill takes less than 2 seconds.  Neurological:     Mental Status: He is alert and oriented to person, place, and time.  Psychiatric:        Behavior: Behavior normal.      Musculoskeletal Exam: C-spine has limited range of motion.  Left trapezius muscle tension and tenderness.  No midline spinal tenderness.  Flexion contractures of both elbows.  Limited range of motion of both wrist joints.  Synovial thickening of all MCP joints but no synovitis noted.  PIP and DIP thickening.  Limited extension of PIP joints.  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  Limited range of motion of both hips, left worse than right.  No knee joint effusion noted.  Ankle joints have good range of motion with no tenderness or joint swelling.  CDAI Exam: CDAI Score: -- Patient Global: --; Provider Global: -- Swollen: --; Tender: -- Joint Exam 12/09/2023   No joint exam has been documented for this visit   There is currently no information documented on the homunculus. Go to the Rheumatology activity and complete the homunculus joint exam.  Investigation: No additional findings.  Imaging: No results found.  Recent Labs: Lab Results  Component Value Date   WBC 9.2 10/20/2023   HGB 13.6 10/20/2023    PLT 275 10/20/2023   NA 139 10/20/2023   K 4.0 10/20/2023   CL 102 10/20/2023   CO2 27 10/20/2023  GLUCOSE 251 (H) 10/20/2023   BUN 11 10/20/2023   CREATININE 0.89 10/20/2023   BILITOT 0.5 10/20/2023   ALKPHOS 79 01/25/2021   AST 14 10/20/2023   ALT 14 10/20/2023   PROT 7.3 10/20/2023   ALBUMIN 4.5 01/25/2021   CALCIUM  9.0 10/20/2023   GFRAA 83 01/27/2020   QFTBGOLDPLUS NEGATIVE 01/24/2023    Speciality Comments: Prior therapy includes: methotrexate ( d/c alcohol use), Arava (d/c patient preference), [place] (patient declined)  PLQ Eye Exam: 10/29/2023 Normal  @[facility] f/u 1 year  Procedures:  No procedures performed Allergies: Penicillins     Assessment / Plan:     Visit Diagnoses: 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 factor (HCC): He has no synovitis on examination today.  He has noticed clinical benefit since adding on Plaquenil  200 mg 1 tablet twice daily Monday through Friday as combination therapy.  He has remained on Xeljanz  5 mg 1 tablet daily.  He has less inflammation on examination today.  He has had increased discomfort in the cervical spine with intermittent numbness to the left shoulder for the past 1 month. He previously underwent a cervical spinal fusion and was encouraged to schedule a follow up visit with his spine specialist.  Overall his rheumatoid arthritis appears to be well-controlled on the current treatment regimen.  He is willing to give Plaquenil  more time and for us  to reassess for the full efficacy at the follow-up visit.  He will notify us  if he develops any new or worsening symptoms. Follow-up in the office in 3 months or sooner if needed.  High risk medication use - Xeljanz  5 mg 1 tablet by mouth daily and plaquenil  200 mg 1 tablet by mouth by mouth twice daily Monday through Friday. CBC and CMP updated on 10/20/23.  His next lab work will be due in January and every 3 months.  TB gold negative on  01/24/23 Lipid panel updated on 01/24/23 No recent or recurrent infections. Discussed the importance of holding xeljanz  if he develops signs or symptoms of an infection and to resume once the infection has completely cleared.   PLQ Eye Exam: 10/29/2023 Normal @[facility] f/u 1 year   Rotator cuff syndrome of right shoulder:   Contracture of joint of both elbows: Flexor tractors of both elbows--unchanged.  No tenderness or inflammation along the joint line.  Primary osteoarthritis of both hands: PIP and DIP thickening.  Limited extension of PIP joints.  No synovitis noted.  History of  bilateral carpal tunnel release: Not currently symptomatic.  Primary osteoarthritis of left hip - X-rays of the left hip are consistent with moderate to severe arthritis.  He has not yet scheduled a surgical date.  Symptoms have been manageable.  His mobility has improved.  Chronic pain of left knee: No warmth or effusion noted.  Primary osteoarthritis of both feet - X-rays of the right foot updated 06/20/2023: Severe arthritis of the first metatarsal phalangeal joint, favored to be secondary to osteoarthritis. Tenderness of the right third PIP and DIP joint.  No synovitis noted.  Discussed that he can use Voltaren  gel topically as needed for symptomatic relief.  DDD (degenerative disc disease), cervical: Previous cervical spinal fusion.  Cervical spine has limited range of motion.  Patient has been experiencing increased pain and stiffness in the cervical spine for the past 1 month.  He has had intermittent numbness radiating to the left shoulder.  Encourage patient to follow-up with his spine specialist for further evaluation  and management given that he has had previous surgical invention.  Other medical conditions are listed as follows:  Palpitations  Mixed hyperlipidemia  Essential hypertension: Blood pressure was 112/79 today in the office.  COPD  GOLD II  Atherosclerosis of native coronary  artery of native heart without angina pectoris  History of diabetes mellitus  PVD (peripheral vascular disease)  Secondary cardiomyopathy (HCC)  Orders: Orders Placed This Encounter  Procedures   QuantiFERON-TB Gold Plus   No orders of the defined types were placed in this encounter.    Follow-Up Instructions: Return in about 3 months (around 03/08/2024) for Rheumatoid arthritis.   Waddell CHRISTELLA Craze, PA-C  Note - This record has been created using Dragon software.  Chart creation errors have been sought, but may not always  have been located. Such creation errors do not reflect on  the standard of medical care.

## 2023-12-09 ENCOUNTER — Encounter: Payer: Self-pay | Admitting: Physician Assistant

## 2023-12-09 ENCOUNTER — Ambulatory Visit: Attending: Physician Assistant | Admitting: Physician Assistant

## 2023-12-09 VITALS — BP 112/79 | HR 79 | Temp 97.9°F | Resp 14 | Ht 69.0 in | Wt 166.2 lb

## 2023-12-09 DIAGNOSIS — M0609 Rheumatoid arthritis without rheumatoid factor, multiple sites: Secondary | ICD-10-CM | POA: Diagnosis not present

## 2023-12-09 DIAGNOSIS — Z111 Encounter for screening for respiratory tuberculosis: Secondary | ICD-10-CM

## 2023-12-09 DIAGNOSIS — E782 Mixed hyperlipidemia: Secondary | ICD-10-CM

## 2023-12-09 DIAGNOSIS — M19041 Primary osteoarthritis, right hand: Secondary | ICD-10-CM

## 2023-12-09 DIAGNOSIS — M19071 Primary osteoarthritis, right ankle and foot: Secondary | ICD-10-CM

## 2023-12-09 DIAGNOSIS — Z79899 Other long term (current) drug therapy: Secondary | ICD-10-CM

## 2023-12-09 DIAGNOSIS — I739 Peripheral vascular disease, unspecified: Secondary | ICD-10-CM

## 2023-12-09 DIAGNOSIS — J449 Chronic obstructive pulmonary disease, unspecified: Secondary | ICD-10-CM

## 2023-12-09 DIAGNOSIS — G8929 Other chronic pain: Secondary | ICD-10-CM

## 2023-12-09 DIAGNOSIS — Z9889 Other specified postprocedural states: Secondary | ICD-10-CM

## 2023-12-09 DIAGNOSIS — M19072 Primary osteoarthritis, left ankle and foot: Secondary | ICD-10-CM

## 2023-12-09 DIAGNOSIS — Z8639 Personal history of other endocrine, nutritional and metabolic disease: Secondary | ICD-10-CM

## 2023-12-09 DIAGNOSIS — M75101 Unspecified rotator cuff tear or rupture of right shoulder, not specified as traumatic: Secondary | ICD-10-CM | POA: Diagnosis not present

## 2023-12-09 DIAGNOSIS — M5030 Other cervical disc degeneration, unspecified cervical region: Secondary | ICD-10-CM

## 2023-12-09 DIAGNOSIS — I2510 Atherosclerotic heart disease of native coronary artery without angina pectoris: Secondary | ICD-10-CM

## 2023-12-09 DIAGNOSIS — M24521 Contracture, right elbow: Secondary | ICD-10-CM

## 2023-12-09 DIAGNOSIS — I429 Cardiomyopathy, unspecified: Secondary | ICD-10-CM

## 2023-12-09 DIAGNOSIS — M1612 Unilateral primary osteoarthritis, left hip: Secondary | ICD-10-CM

## 2023-12-09 DIAGNOSIS — R002 Palpitations: Secondary | ICD-10-CM

## 2023-12-09 DIAGNOSIS — M24522 Contracture, left elbow: Secondary | ICD-10-CM

## 2023-12-09 DIAGNOSIS — I10 Essential (primary) hypertension: Secondary | ICD-10-CM

## 2023-12-09 DIAGNOSIS — M25562 Pain in left knee: Secondary | ICD-10-CM

## 2023-12-09 DIAGNOSIS — M19042 Primary osteoarthritis, left hand: Secondary | ICD-10-CM

## 2023-12-09 NOTE — Patient Instructions (Signed)
 Standing Labs We placed an order today for your standing lab work.   Please have your standing labs drawn in Mid-January and every 3 months    Please have your labs drawn 2 weeks prior to your appointment so that the provider can discuss your lab results at your appointment, if possible.  Please note that you may see your imaging and lab results in MyChart before we have reviewed them. We will contact you once all results are reviewed. Please allow our office up to 72 hours to thoroughly review all of the results before contacting the office for clarification of your results.  WALK-IN LAB HOURS  Monday through Thursday from 8:00 am - 4:30 pm and Friday from 8:00 am-12:00 pm.  Patients with office visits requiring labs will be seen before walk-in labs.  You may encounter longer than normal wait times. Please allow additional time. Wait times may be shorter on  Monday and Thursday afternoons.  We do not book appointments for walk-in labs. We appreciate your patience and understanding with our staff.   Labs are drawn by [facility]. Please bring your co-pay at the time of your lab draw.  You may receive a bill from [facility] for your lab work.  Please note if you are on Hydroxychloroquine and and an order has been placed for a Hydroxychloroquine level,  you will need to have it drawn 4 hours or more after your last dose.  If you wish to have your labs drawn at another location, please call the office 24 hours in advance so we can fax the orders.  The office is located at [address], [place], [place] [address]   If you have any questions regarding directions or hours of operation,  please call [phone].   As a reminder, please drink plenty of water prior to coming for your lab work. Thanks!

## 2023-12-12 ENCOUNTER — Telehealth: Payer: Self-pay

## 2023-12-12 NOTE — Telephone Encounter (Signed)
 Patient called and left a message - he has a question about his toenail - his next appt with Dr. Gaynel is 12/24/23. [phone]

## 2023-12-15 ENCOUNTER — Ambulatory Visit (INDEPENDENT_AMBULATORY_CARE_PROVIDER_SITE_OTHER): Admitting: Podiatry

## 2023-12-15 ENCOUNTER — Ambulatory Visit

## 2023-12-15 ENCOUNTER — Encounter: Payer: Self-pay | Admitting: Podiatry

## 2023-12-15 ENCOUNTER — Ambulatory Visit: Admitting: Physician Assistant

## 2023-12-15 DIAGNOSIS — E1159 Type 2 diabetes mellitus with other circulatory complications: Secondary | ICD-10-CM

## 2023-12-15 DIAGNOSIS — M7751 Other enthesopathy of right foot: Secondary | ICD-10-CM | POA: Diagnosis not present

## 2023-12-15 NOTE — Progress Notes (Signed)
 Subjective:  Patient ID: [name], male    DOB: Feb 24, 1950,   MRN: [id number]  Chief Complaint  Patient presents with   Diabetes    My toe has been hurting me so bad.  I can hardly walk on it.  I think I have an ingrown toenail.  I been soaking it over the weekend and that seemed to help.  Saw Dr. Rosamond - 10/30/2023;  A1c is 7.3    73 y.o. male presents for concern of ingrown toenail on his right third toe.  He relates several weeks ago it started getting red and swollen and painful.  He does relate that has been doing better recently.  He has been soaking it and putting on diclofenac  gel and this seems to be helping.  He does have a history of rheumatoid arthritis as well as severe PAD.  He is scheduled for new vascular studies in the next couple months.. Denies any other pedal complaints. Denies n/v/f/c.   Past Medical History:  Diagnosis Date   Anemia    Bronchitis    Carotid artery disease    Cervical spine disease    Neck surgery in 2007 with Dr. Leeann   Collagen vascular disease    COPD (chronic obstructive pulmonary disease) (HCC)    Coronary atherosclerosis of native coronary artery    Nonobstrtuctive by catheterization 2007, 2013, 2021   DVT (deep venous thrombosis) (HCC)    Essential hypertension    GERD (gastroesophageal reflux disease)    Hyperlipidemia    PAD (peripheral artery disease)    s/p left fem-pop bypass 2007; PTA and stenting of right iliac artery 09/2011   Rheumatoid arthritis(714.0)    Type 2 diabetes mellitus (HCC)     Objective:  Physical Exam: Vascular: DP/PT pulses 2/4 bilateral. CFT <3 seconds. Feet cold to touch. Absent hair growth on digits. Edema noted to bilateral lower extremities. Xerosis noted bilaterally.  Skin. No lacerations or abrasions bilateral feet. Nails 1-5 bilateral  are thickened discolored and elongated with subungual debris.  Left third digit mildly edematous.  No erythema no incurvation of the nail noted. Musculoskeletal:  MMT 5/5 bilateral lower extremities in DF, PF, Inversion and Eversion. Deceased ROM in DF of ankle joint.  Mild tenderness to palpation of the right third digit Neurological: Sensation intact to light touch. Protective sensation diminished bilateral.    Assessment:   1. Capsulitis of toe of right foot   2. Type 2 diabetes mellitus with vascular disease (HCC)      Plan:  Patient was evaluated and treated and all questions answered. -Discussed and educated patient on foot care, especially with  regards to the vascular, neurological and musculoskeletal systems.  -Stressed the importance of good glycemic control and the detriment of not  controlling glucose levels in relation to the foot. -Discussed supportive shoes at all times and checking feet regularly.  -Mechanically debrided all right third digit nail using sterile nail nipper and filed with dremel without incident  -Discussed pain could likely be coming from a rheumatoid flare of the toe and to continue diclofenac  gel on this area as well as the soaks. -Discussed possibility of decreasing circulation as well although capillary refill does appear to be present.  Will follow-up with ABIs that are scheduled in a couple months. -Answered all patient questions -Patient advised to call the office if any problems or questions arise in the meantime. - Discussed if any worsening of the digits to reach back out and we will assess  further. Follow-up as scheduled for routine footcare.  Asberry Failing, DPM

## 2023-12-19 ENCOUNTER — Other Ambulatory Visit: Payer: Self-pay | Admitting: Rheumatology

## 2023-12-19 ENCOUNTER — Other Ambulatory Visit: Payer: Self-pay

## 2023-12-19 DIAGNOSIS — M0609 Rheumatoid arthritis without rheumatoid factor, multiple sites: Secondary | ICD-10-CM

## 2023-12-19 DIAGNOSIS — Z79899 Other long term (current) drug therapy: Secondary | ICD-10-CM

## 2023-12-19 MED ORDER — XELJANZ 5 MG PO TABS
5.0000 mg | ORAL_TABLET | Freq: Every day | ORAL | 0 refills | Status: AC
  Filled 2023-12-19: qty 90, 90d supply, fill #0
  Filled 2023-12-22: qty 30, 30d supply, fill #0
  Filled 2024-01-16 – 2024-02-10 (×4): qty 30, 30d supply, fill #1

## 2023-12-19 NOTE — Telephone Encounter (Signed)
 Last Fill: 09/18/2023  Labs: 10/20/2023 Glucose is elevated-251, rest of CMP WNL CBC stable.  TB Gold: 01/24/2023 Neg    Next Visit: 03/10/2024  Last Visit: 12/09/2023  IK:Myzlfjunpi arthritis of multiple sites with negative rheumatoid factor   Current Dose per office note 12/09/2023: Xeljanz  5 mg 1 tablet by mouth daily   Okay to refill Xeljanz ?

## 2023-12-22 ENCOUNTER — Other Ambulatory Visit: Payer: Self-pay

## 2023-12-22 ENCOUNTER — Ambulatory Visit: Admitting: Physician Assistant

## 2023-12-22 NOTE — Progress Notes (Signed)
 Specialty Pharmacy Refill Coordination Note  [name] is a 73 y.o. male contacted today regarding refills of specialty medication(s) Tofacitinib  Citrate (Xeljanz )   Patient requested Delivery   Delivery date: 12/26/23   Verified address: [address]  [place]   Medication will be filled on: 12/25/23

## 2023-12-24 ENCOUNTER — Encounter: Payer: Self-pay | Admitting: Podiatry

## 2023-12-24 ENCOUNTER — Ambulatory Visit (INDEPENDENT_AMBULATORY_CARE_PROVIDER_SITE_OTHER): Admitting: Podiatry

## 2023-12-24 DIAGNOSIS — M79675 Pain in left toe(s): Secondary | ICD-10-CM | POA: Diagnosis not present

## 2023-12-24 DIAGNOSIS — M7751 Other enthesopathy of right foot: Secondary | ICD-10-CM | POA: Diagnosis not present

## 2023-12-24 DIAGNOSIS — M79674 Pain in right toe(s): Secondary | ICD-10-CM | POA: Diagnosis not present

## 2023-12-24 DIAGNOSIS — B351 Tinea unguium: Secondary | ICD-10-CM | POA: Diagnosis not present

## 2023-12-25 ENCOUNTER — Other Ambulatory Visit: Payer: Self-pay

## 2023-12-28 NOTE — Progress Notes (Signed)
 "  Subjective:  Patient ID: [name], male    DOB: Mar 14, 1950,  MRN: [id number]  [name] presents to clinic today for at risk foot care. Pt has h/o NIDDM with PAD and painful elongated mycotic toenails 1-5 bilaterally which are tender when wearing enclosed shoe gear. Pain is relieved with periodic professional debridement. He saw Dr. Sikora for c/o painful right 3rd digit thinking he might have an ingrown toenail. His pain is localized to the distal joint of the digit. He was also given Rx for prednisone  and Mobic. He will start Mobic after he completes prednisone . Has also been soaking his foot. I just want to make sure nothing's wrong with my feet. Chief Complaint  Patient presents with   [facility]    [facility] is taking prednisone , 10/20/23 Rosamond Mon B, MD (PCP), 10/30/23    New problem(s): None.   PCP is Rosamond Mon B, MD.  Allergies[1]  Review of Systems: Negative except as noted in the HPI.  Objective:  There were no vitals filed for this visit. [name] is a pleasant 73 y.o. male WD, WN in NAD. AAO x 3.  Vascular Examination: CFT <4 seconds b/l. DP pulses diminished b/l. PT pulses diminished b/l. Digital hair absent. Skin temperature gradient warm to cool b/l. He appears to have no new onset of ischemia or gangrene to any of his toes. No cyanosis or clubbing noted b/l. No edema noted b/l LE.   Neurological Examination: Sensation  diminished with 10 gram monofilament.   Dermatological Examination: Pedal skin thin, shiny and atrophic b/l. No open wounds. No nidus for infection noted right 3rd digit.  No interdigital macerations.   Toenails 1-5 b/l thick, discolored, elongated with subungual debris and pain on dorsal palpation.   No hyperkeratotic nor porokeratotic lesions present on today's visit.  Musculoskeletal Examination: Right 3rd digit with mild edema at DIPJ. No ischemia. Muscle strength 5/5 to all lower extremity muscle groups bilaterally. HAV  with bunion deformity noted b/l LE. Hammertoe deformity noted 2-5 b/l.  Radiographs: None  Assessment/Plan: 1. Pain due to onychomycosis of toenails of both feet   Consent given for treatment. Patient examined. Has seen Rheumatology and Dr. Joya in our office. He is currently on prednisone  and states improvement in pain. I have advised him to discontinue soaking until seen by Vascular. All patient's and/or POA's questions/concerns addressed on today's visit. I did recommend new ABIs to assess digital perfusion and he may have this done with Vascular Surgery. He states he will give them a call. Toenails 1-5 b/l debrided in length and girth without incident. Continue foot and shoe inspections daily. Monitor blood glucose per PCP/Endocrinologist's recommendations. Continue soft, supportive shoe gear daily. Report any pedal injuries to medical professional. Call office if there are any questions/concerns. -Patient/POA to call should there be question/concern in the interim.   Return in about 3 months (around 03/23/2024).  [name], DPM      [place] LOCATION: [address] [place] [address]                   Office [phone]   [facility] LOCATION: [address] [place], [place] [address] Office [phone]     [  1]  Allergies Allergen Reactions   Penicillins Hives   "

## 2024-01-12 ENCOUNTER — Ambulatory Visit (HOSPITAL_COMMUNITY)
Admission: RE | Admit: 2024-01-12 | Discharge: 2024-01-12 | Disposition: A | Discharge status: Home / Self Care | Source: Ambulatory Visit | Attending: Physician Assistant | Admitting: Physician Assistant

## 2024-01-12 ENCOUNTER — Ambulatory Visit: Attending: Physician Assistant | Admitting: Physician Assistant

## 2024-01-12 ENCOUNTER — Ambulatory Visit (HOSPITAL_COMMUNITY): Admission: RE | Admit: 2024-01-12 | Discharge: 2024-01-12 | Attending: Physician Assistant

## 2024-01-12 VITALS — BP 130/84 | HR 88 | Temp 97.8°F | Wt 161.7 lb

## 2024-01-12 DIAGNOSIS — T82599A Other mechanical complication of unspecified cardiac and vascular devices and implants, initial encounter: Secondary | ICD-10-CM | POA: Insufficient documentation

## 2024-01-12 DIAGNOSIS — Z95828 Presence of other vascular implants and grafts: Secondary | ICD-10-CM

## 2024-01-12 DIAGNOSIS — I739 Peripheral vascular disease, unspecified: Secondary | ICD-10-CM | POA: Insufficient documentation

## 2024-01-12 DIAGNOSIS — I70221 Atherosclerosis of native arteries of extremities with rest pain, right leg: Secondary | ICD-10-CM

## 2024-01-12 LAB — VAS US ABI WITH/WO TBI

## 2024-01-12 MED ORDER — SULFAMETHOXAZOLE-TRIMETHOPRIM 400-80 MG PO TABS: 1.0000 | ORAL_TABLET | Freq: Two times a day (BID) | ORAL | 0 refills | Status: DC

## 2024-01-12 NOTE — Progress Notes (Signed)
 " HISTORY AND PHYSICAL     CC:  follow up. Requesting Provider:  [name], MD  HPI: This is a 74 y.o. male who is here today for follow up for PAD.  Pt has hx of  left femoral to popliteal (AK) bypass with PTFE on 11/06/2005 by Dr. Gerlean, redo left CFA to AK popliteal bypass with PTFE on 06/22/2010 by Dr. Gerlean,  right subintimal angioplasty and stenting of right CIA 10/03/2011 by Dr. Laurence, angiogram with drug coated angioplasty right CIA 01/16/2017 by Dr. Laurence, angiogram with right mid SFA angioplasty with drug coated balloon, right proximal ATA angioplasty 03/09/2020 by Dr. Gretta.  Angiogram 12/02/2022 by Dr. Pearline that revealed patent left femoral popliteal bypass.  Pt was last seen 09/05/2023 and at that time, he was having pain in his hips, knees and ankles due to RA and stated it made it hard for him to walk.  He was not having any claudication, rest pain or tissue loss.  His duplexes were stable and his Pletal  was discontinued bc his rheumatologist wanted to start plaquenil .  He was scheduled for 3 month follow up and instructed to continue plavix  and statin.   Pt saw his podiatrist on 12/24/2023 and had painful right 3rd toe.    The pt returns today for follow up.  He states he has not been able to sleep for the past 3 nights due to pain in the 3rd toe on the right foot.  He states over the past 3 days it has started draining.   He states it is interfering in his ability to walk.  He states it does feel a little better when he hangs it off the side of the bed.  He denies any pain in the left leg or foot.    The pt is on a statin for cholesterol management.    The pt is not on an aspirin .    Other AC:  Plavix  The pt is on BB for hypertension.  The pt is  on diabetic medication. Tobacco hx:  former  Pt does not have family hx of AAA.  Past Medical History:  Diagnosis Date   Anemia    Bronchitis    Carotid artery disease    Cervical spine disease    Neck surgery in 2007 with Dr.  Leeann   Collagen vascular disease    COPD (chronic obstructive pulmonary disease) (HCC)    Coronary atherosclerosis of native coronary artery    Nonobstrtuctive by catheterization 2007, 2013, 2021   DVT (deep venous thrombosis) (HCC)    Essential hypertension    GERD (gastroesophageal reflux disease)    Hyperlipidemia    PAD (peripheral artery disease)    s/p left fem-pop bypass 2007; PTA and stenting of right iliac artery 09/2011   Rheumatoid arthritis(714.0)    Type 2 diabetes mellitus (HCC)     Past Surgical History:  Procedure Laterality Date   ABDOMINAL AORTAGRAM N/A 10/03/2011   Procedure: ABDOMINAL EZELLA;  Surgeon: Redell LITTIE Laurence, MD;  Location: [facility] CATH LAB;  Service: Cardiovascular;  Laterality: N/A;   ABDOMINAL AORTOGRAM W/LOWER EXTREMITY N/A 01/16/2017   Procedure: ABDOMINAL AORTOGRAM W/LOWER EXTREMITY;  Surgeon: Laurence Redell LITTIE, MD;  Location: [facility] INVASIVE CV LAB;  Service: Cardiovascular;  Laterality: N/A;   ABDOMINAL AORTOGRAM W/LOWER EXTREMITY Bilateral 08/04/2019   Procedure: ABDOMINAL AORTOGRAM W/LOWER EXTREMITY;  Surgeon: Gretta Lonni PARAS, MD;  Location: [facility] INVASIVE CV LAB;  Service: Cardiovascular;  Laterality: Bilateral;   ABDOMINAL AORTOGRAM W/LOWER  EXTREMITY N/A 03/09/2020   Procedure: ABDOMINAL AORTOGRAM W/LOWER EXTREMITY;  Surgeon: Gretta Lonni PARAS, MD;  Location: [facility] INVASIVE CV LAB;  Service: Cardiovascular;  Laterality: N/A;   ABDOMINAL AORTOGRAM W/LOWER EXTREMITY N/A 12/02/2022   Procedure: ABDOMINAL AORTOGRAM W/LOWER EXTREMITY;  Surgeon: Pearline Norman RAMAN, MD;  Location: [facility] INVASIVE CV LAB;  Service: Cardiovascular;  Laterality: N/A;   BIOPSY N/A 12/28/2021   Procedure: BIOPSY;  Surgeon: Cindie Carlin POUR, DO;  Location: [facility] ENDO SUITE;  Service: Endoscopy;  Laterality: N/A;   CATARACT EXTRACTION W/PHACO Right 04/18/2023   Procedure: PHACOEMULSIFICATION, CATARACT, WITH IOL INSERTION;  Surgeon: Juli Blunt, MD;  Location: [facility] ORS;  Service:  Ophthalmology;  Laterality: Right;  CDE: 1.81   CATARACT EXTRACTION W/PHACO Left 05/02/2023   Procedure: PHACOEMULSIFICATION, CATARACT, WITH IOL INSERTION;  Surgeon: Juli Blunt, MD;  Location: [facility] ORS;  Service: Ophthalmology;  Laterality: Left;  CDE: 1.17   CERVICAL DISC SURGERY     COLONOSCOPY  2013   Hyperplastic polyp   COLONOSCOPY WITH PROPOFOL  N/A 02/17/2017   Surgeon: Shaaron Lamar HERO, MD; Many small and large mouth diverticula in the sigmoid, descending, transverse colon, internal hemorrhoids that were medium sized and grade 1.   COLONOSCOPY WITH PROPOFOL  N/A 12/28/2021   Surgeon: Cindie Carlin POUR, DO;  Diverticulosis in the sigmoid colon and descending colon.  Likely benign tumor in the ascending colon.   ESOPHAGOGASTRODUODENOSCOPY (EGD) WITH PROPOFOL  N/A 12/28/2021   Procedure: ESOPHAGOGASTRODUODENOSCOPY (EGD) WITH PROPOFOL ;  Surgeon: Cindie Carlin POUR, DO;  Location: [facility] ENDO SUITE;  Service: Endoscopy;  Laterality: N/A;   FEMORAL-POPLITEAL BYPASS GRAFT  11/06/2005   ILIAC ARTERY STENT  10/03/2011   Right CIA stenting   INSERTION, STENT, DRUG-ELUTING, LACRIMAL CANALICULUS Right 04/18/2023   Procedure: INSERTION, STENT, DRUG-ELUTING, LACRIMAL CANALICULUS;  Surgeon: Juli Blunt, MD;  Location: [facility] ORS;  Service: Ophthalmology;  Laterality: Right;   INSERTION, STENT, DRUG-ELUTING, LACRIMAL CANALICULUS Left 05/02/2023   Procedure: INSERTION, STENT, DRUG-ELUTING, LACRIMAL CANALICULUS;  Surgeon: Juli Blunt, MD;  Location: [facility] ORS;  Service: Ophthalmology;  Laterality: Left;   LEFT HEART CATH AND CORONARY ANGIOGRAPHY N/A 04/14/2019   Procedure: LEFT HEART CATH AND CORONARY ANGIOGRAPHY;  Surgeon: Burnard Debby LABOR, MD;  Location: [facility] INVASIVE CV LAB;  Service: Cardiovascular;  Laterality: N/A;   MOUTH SURGERY Right 09/16/2023   had to cut into the jaw   PERIPHERAL VASCULAR BALLOON ANGIOPLASTY Right 01/16/2017   Procedure: PERIPHERAL VASCULAR BALLOON ANGIOPLASTY;  Surgeon:  Laurence Redell CROME, MD;  Location: [facility] INVASIVE CV LAB;  Service: Cardiovascular;  Laterality: Right;  common iliac    PERIPHERAL VASCULAR BALLOON ANGIOPLASTY  03/09/2020   Procedure: PERIPHERAL VASCULAR BALLOON ANGIOPLASTY;  Surgeon: Gretta Lonni PARAS, MD;  Location: [facility] INVASIVE CV LAB;  Service: Cardiovascular;;   PR VEIN BYPASS GRAFT,AORTO-FEM-POP  06/22/2010   Redo Left Fem-Pop   Repair of left arm fracture     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 8 g 2   atorvastatin  (LIPITOR) 80 MG tablet TAKE 1 TABLET(80 MG) BY MOUTH DAILY 90 tablet 2   baclofen (LIORESAL) 10 MG tablet Take 10 mg by mouth 2 (two) times daily.     cilostazol  (PLETAL ) 100 MG tablet Take 100 mg by mouth daily.  ciprofloxacin (CILOXAN) 0.3 % ophthalmic solution Place 1 drop into the right eye 4 (four) times daily.     clopidogrel  (PLAVIX ) 75 MG tablet Take 1 tablet (75 mg total) by mouth daily. 30 tablet 11   dexlansoprazole  (DEXILANT ) 60 MG capsule Take 1 capsule (60 mg total) by mouth daily. 30 capsule 11   diclofenac  Sodium (VOLTAREN ) 1 % GEL Apply 2 gram  to painful foot and ankle joint twice daily. 150 g 1   empagliflozin (JARDIANCE) 25 MG TABS tablet Take 25 mg by mouth in the morning.     glipiZIDE (GLUCOTROL) 5 MG tablet Take 5-10 mg by mouth See admin instructions.     HYDROcodone-acetaminophen  (NORCO) 7.5-325 MG per tablet Take 1 tablet by mouth 2 (two) times daily as needed (pain.).     hydrocortisone  2.5 % lotion Apply topically daily. Apply to affected area of face daily x 1 month then decrease to once a day Monday, Wednesday, Friday 59 mL 11   hydroxychloroquine  (PLAQUENIL ) 200 MG tablet Take one tablet twice daily Mondays through Fridays only. DO NOT  TAKE SATURDAY OR SUNDAY. 120 tablet 0   JANUVIA 100 MG tablet Take 100 mg by mouth in the morning.     ketoconazole  (NIZORAL ) 2 % cream Apply to both feet and between toes once daily for 6 weeks. 60 g 1   ketorolac  (ACULAR ) 0.5 % ophthalmic solution Place 1 drop into the right eye 4 (four) times daily.     LANTUS SOLOSTAR 100 UNIT/ML Solostar Pen Inject 10 Units into the skin at bedtime.     meloxicam (MOBIC) 15 MG tablet Take 15 mg by mouth daily as needed.     metoprolol  succinate (TOPROL -XL) 25 MG 24 hr tablet Take 0.5 tablets (12.5 mg total) by mouth daily. 45 tablet 3   nitroGLYCERIN  (NITROSTAT ) 0.4 MG SL tablet Place 1 tablet (0.4 mg total) under the tongue every 5 (five) minutes x 3 doses as needed for chest pain (If no relief after 3rd dose, GO TO ER.). 25 tablet 3   Omega-3 Fatty Acids (OMEGA-3 FISH OIL PO) Take 1 capsule by mouth daily.     ONE TOUCH ULTRA TEST test strip      OneTouch Delica Lancets 33G MISC Apply topically.     Plecanatide  (TRULANCE ) 3 MG TABS Take 1 tablet (3 mg total) by mouth daily. (Patient taking differently: Take 3 mg by mouth as needed.) 30 tablet 5   sildenafil (REVATIO) 20 MG tablet Take 40-60 mg by mouth daily as needed for erectile dysfunction.     SYMBICORT  160-4.5 MCG/ACT inhaler INHALE 2 PUFFS FIRST THING IN THE MORNING AND THEN 2 PUFFS 12 HOURS LATER 10.2 g 5   Tiotropium Bromide  (SPIRIVA  RESPIMAT) 2.5 MCG/ACT AERS INHALE 2 PUFFS INTO THE LUNGS DAILY 4 g 3   XELJANZ  5 MG TABS Take 1 tablet (5 mg total) by mouth daily. 90 tablet 0   No current facility-administered medications for this visit.    Family History  Problem Relation Age of Onset   Heart attack Mother 61   Deep vein thrombosis Mother    Diabetes Mother    Hyperlipidemia Mother    Hypertension Mother    Peripheral vascular disease Mother    Stroke Father    Diabetes Father    Hyperlipidemia Father    Hypertension Father    Peripheral vascular disease Father    Diabetes Sister     Hyperlipidemia Sister    Hypertension Sister  Diabetes Brother    Hyperlipidemia Brother    Cancer Brother    Deep vein thrombosis Brother    Hypertension Brother    Peripheral vascular disease Brother    Diabetes Daughter    Hyperlipidemia Son    Cancer Son    Hypertension Son    Colon cancer Neg Hx     Social History   Socioeconomic History   Marital status: Single    Spouse name: Not on file   Number of children: Not on file   Years of education: Not on file   Highest education level: Not on file  Occupational History   Not on file  Tobacco Use   Smoking status: Former    Current packs/day: 0.00    Average packs/day: 1.5 packs/day for 45.0 years (67.5 ttl pk-yrs)    Types: Cigarettes, Cigars    Start date: 09/01/1966    Quit date: 09/01/2011    Years since quitting: 12.3    Passive exposure: Never   Smokeless tobacco: Never  Vaping Use   Vaping status: Never Used  Substance and Sexual Activity   Alcohol use: Not Currently    Comment: occasionally   Drug use: No   Sexual activity: Not Currently    Birth control/protection: None  Other Topics Concern   Not on file  Social History Narrative   The patient lives in [place].    Social Drivers of Health   Tobacco Use: Medium Risk (12/24/2023)   Patient History    Smoking Tobacco Use: Former    Smokeless Tobacco Use: Never    Passive Exposure: Never  Programmer, Applications: Not on [employer] Insecurity: Not on file  Transportation Needs: Not on file  Physical Activity: Not on file  Stress: Not on file  Social Connections: Not on file  Intimate Partner Violence: Not on file  Depression (PHQ2-9): Not on file  Alcohol Screen: Not on file  Housing: Not on file  Utilities: Not on file  Health Literacy: Not on file     REVIEW OF SYSTEMS:   [X]  denotes positive finding, [ ]  denotes negative finding Cardiac  Comments:  Chest pain or chest pressure:    Shortness of breath upon exertion:    Short of breath  when lying flat:    Irregular heart rhythm:        Vascular    Pain in calf, thigh, or hip brought on by ambulation:    Pain in feet at night that wakes you up from your sleep:     Blood clot in your veins:    Leg swelling:         Pulmonary    Oxygen at home:    Productive cough:     Wheezing:         Neurologic    Sudden weakness in arms or legs:     Sudden numbness in arms or legs:     Sudden onset of difficulty speaking or slurred speech:    Temporary loss of vision in one eye:     Problems with dizziness:         Gastrointestinal    Blood in stool:     Vomited blood:         Genitourinary    Burning when urinating:     Blood in urine:        Psychiatric    Major depression:         Hematologic    Bleeding problems:  Problems with blood clotting too easily:        Skin    Rashes or ulcers:        Constitutional    Fever or chills:      PHYSICAL EXAMINATION:  Today's Vitals   01/12/24 1118  BP: 130/84  Pulse: 88  Temp: 97.8 F (36.6 C)  TempSrc: Temporal  Weight: 161 lb 11.2 oz (73.3 kg)  PainLoc: Toe   Body mass index is 23.88 kg/m.   General:  WDWN in NAD; vital signs documented above Gait: Not observed HENT: WNL, normocephalic Pulmonary: normal non-labored breathing , without wheezing Cardiac: regular HR, without carotid bruits Abdomen: soft, NT; aortic pulse is not palpable Skin: without rashes Vascular Exam/Pulses:  Right Left  Radial 2+ (normal) 2+ (normal)  Femoral 2+ (normal) 2+ (normal)  DP absent absent  PT Monophasic  monophasic  Peroneal monophasic monophasic   Extremities: right 3rd toe as pictured  Musculoskeletal: no muscle wasting or atrophy  Neurologic: A&O X 3 Psychiatric:  The pt has Normal affect.   Non-Invasive Vascular Imaging:   ABI's/TBI's on 01/12/2024: Right:  [place]/0.18 - Great toe pressure: 27 Left:  [place]/0.28 - Great toe pressure: 42  Arterial duplex on  01/12/2024: +----------+--------+-----+--------+-----------+--------+  LEFT     PSV cm/sRatioStenosisWaveform   Comments  +----------+--------+-----+--------+-----------+--------+  POP Prox  47                   triphasic            +----------+--------+-----+--------+-----------+--------+  POP Mid   44                   triphasic            +----------+--------+-----+--------+-----------+--------+  POP Distal41                   multiphasic          +----------+--------+-----+--------+-----------+--------+  TP Trunk  57                   multiphasic          +----------+--------+-----+--------+-----------+--------+     Left Graft #1: CFA -AK Pop artery BPG  +--------------------+--------+--------+---------+--------+                     PSV cm/sStenosisWaveform Comments  +--------------------+--------+--------+---------+--------+  Inflow             82              biphasic           +--------------------+--------+--------+---------+--------+  Proximal Anastomosis114             triphasic          +--------------------+--------+--------+---------+--------+  Proximal Graft      61              biphasic           +--------------------+--------+--------+---------+--------+  Mid Graft           51              triphasic          +--------------------+--------+--------+---------+--------+  Distal Graft        37              biphasic           +--------------------+--------+--------+---------+--------+  Distal Anastomosis  45              triphasic          +--------------------+--------+--------+---------+--------+  Outflow            42              triphasic          +--------------------+--------+--------+---------+--------+   Summary:  Left: Patent left common femoral to above knee popliteal artery bypass graft. There is no evidence of stenosis within the graft.   Summary:  Abdominal Aorta: No  evidence of an abdominal aortic aneurysm was visualized. 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 Patent right common iliac artery stent, there is no evidence of in stent stenosis. The right common iliac artery is dilated. Ectatic mid/distal right common iliac artery, with largest diameter of 1.9 cm.  IVC/Iliac: There is no evidence of thrombus involving the IVC   Previous ABI's/TBI's on 09/05/2023: Right:  [place]/0.39 - Great toe pressure: 55 Left:  [place]/0.51 - Great toe pressure:  73  Previous arterial duplex on 09/05/2023: Left: Patent common femoral artery to above-the-knee popliteal artery  without evidence of stenosis.   Abdominal Aorta: No evidence of an abdominal aortic aneurysm was  visualized. There is evidence of abnormal dilation of the Right Common Iliac artery. Ectatic mid/distal right common iliac artery, with largest diameter of 1.9 cm.    ASSESSMENT/PLAN:: 74 y.o. male here for follow up for PAD with hx of  left femoral to popliteal (AK) bypass with PTFE on 11/06/2005 by Dr. Gerlean, redo left CFA to AK popliteal bypass with PTFE on 06/22/2010 by Dr. Gerlean,  right subintimal angioplasty and stenting of right CIA 10/03/2011 by Dr. Laurence, angiogram with drug coated angioplasty right CIA 01/16/2017 by Dr. Laurence, angiogram with right mid SFA angioplasty with drug coated balloon, right proximal ATA angioplasty 03/09/2020 by Dr. Gretta.  Angiogram 12/02/2022 by Dr. Pearline that revealed patent left femoral popliteal bypass.  PAD LLE -pt with decreased velocities in the distal graft and anastomosis and therefore, concern for graft failure.      PAD RLE -ABI with decreased TBI and toe pressures since August.  He now has new right 3rd toe pain with discoloration and some clear drainage from the toe.  I did send Bactrim  DS to his pharmacy for 7 day supply.    -give the concerns listed above, he will undergo BLE arteriography on 01/15/2024 with Dr. Gretta with possible intervention.  I  discussed with him that we would evaluate his bypass given the decreased velocities and will evaluate blood flow in the RLE given CLI.  He has palpable femoral pulses bilaterally.   Discussed that he may be at risk for toe amputation.  Also discussed pending findings, he may need bypass in the right leg.  He has a court date on 01/22/2024.  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  He did not have any complaints of abdominal pain today.   -he tried to call his daughter Eleanor a couple of times while we were in the exam room but she did not answer.  He knows to let her know she can call if she needs to speak with me.   -continue plavix /statin.  He states he was taken off of the aspirin .    Lucie Apt, [facility] [phone]  Clinic MD:   [place]       [1]  Allergies Allergen Reactions   Penicillins Hives   "

## 2024-01-15 ENCOUNTER — Encounter (HOSPITAL_COMMUNITY)
Admission: RE | Disposition: A | Discharge status: Home / Self Care | Payer: Self-pay | Source: Home / Self Care | Attending: Vascular Surgery

## 2024-01-15 ENCOUNTER — Ambulatory Visit (HOSPITAL_COMMUNITY)
Admission: RE | Admit: 2024-01-15 | Discharge: 2024-01-15 | Disposition: A | Discharge status: Home / Self Care | Attending: Vascular Surgery | Admitting: Vascular Surgery

## 2024-01-15 ENCOUNTER — Other Ambulatory Visit: Payer: Self-pay

## 2024-01-15 DIAGNOSIS — Z794 Long term (current) use of insulin: Secondary | ICD-10-CM | POA: Insufficient documentation

## 2024-01-15 DIAGNOSIS — E1151 Type 2 diabetes mellitus with diabetic peripheral angiopathy without gangrene: Secondary | ICD-10-CM | POA: Insufficient documentation

## 2024-01-15 DIAGNOSIS — Z87891 Personal history of nicotine dependence: Secondary | ICD-10-CM | POA: Diagnosis not present

## 2024-01-15 DIAGNOSIS — E785 Hyperlipidemia, unspecified: Secondary | ICD-10-CM | POA: Insufficient documentation

## 2024-01-15 DIAGNOSIS — I771 Stricture of artery: Secondary | ICD-10-CM | POA: Diagnosis not present

## 2024-01-15 DIAGNOSIS — L97519 Non-pressure chronic ulcer of other part of right foot with unspecified severity: Secondary | ICD-10-CM | POA: Insufficient documentation

## 2024-01-15 DIAGNOSIS — Z79899 Other long term (current) drug therapy: Secondary | ICD-10-CM | POA: Insufficient documentation

## 2024-01-15 DIAGNOSIS — Z7984 Long term (current) use of oral hypoglycemic drugs: Secondary | ICD-10-CM | POA: Diagnosis not present

## 2024-01-15 DIAGNOSIS — M069 Rheumatoid arthritis, unspecified: Secondary | ICD-10-CM | POA: Diagnosis not present

## 2024-01-15 DIAGNOSIS — I70235 Atherosclerosis of native arteries of right leg with ulceration of other part of foot: Secondary | ICD-10-CM | POA: Insufficient documentation

## 2024-01-15 DIAGNOSIS — E11621 Type 2 diabetes mellitus with foot ulcer: Secondary | ICD-10-CM | POA: Insufficient documentation

## 2024-01-15 DIAGNOSIS — I10 Essential (primary) hypertension: Secondary | ICD-10-CM | POA: Insufficient documentation

## 2024-01-15 DIAGNOSIS — Z7902 Long term (current) use of antithrombotics/antiplatelets: Secondary | ICD-10-CM | POA: Insufficient documentation

## 2024-01-15 DIAGNOSIS — T82599A Other mechanical complication of unspecified cardiac and vascular devices and implants, initial encounter: Secondary | ICD-10-CM

## 2024-01-15 HISTORY — PX: ABDOMINAL AORTOGRAM W/LOWER EXTREMITY: CATH118223

## 2024-01-15 LAB — GLUCOSE, CAPILLARY
Glucose-Capillary: 329 mg/dL — ABNORMAL HIGH (ref 70–99)
Glucose-Capillary: 333 mg/dL — ABNORMAL HIGH (ref 70–99)

## 2024-01-15 LAB — POCT I-STAT, CHEM 8
BUN: 18 mg/dL (ref 8–23)
Calcium, Ion: 1.12 mmol/L — ABNORMAL LOW (ref 1.15–1.40)
Chloride: 103 mmol/L (ref 98–111)
Creatinine, Ser: 1.1 mg/dL (ref 0.61–1.24)
Glucose, Bld: 219 mg/dL — ABNORMAL HIGH (ref 70–99)
HCT: 46 % (ref 39.0–52.0)
Hemoglobin: 15.6 g/dL (ref 13.0–17.0)
Potassium: 5.4 mmol/L — ABNORMAL HIGH (ref 3.5–5.1)
Sodium: 140 mmol/L (ref 135–145)
TCO2: 28 mmol/L (ref 22–32)

## 2024-01-15 LAB — POCT ACTIVATED CLOTTING TIME
Activated Clotting Time: 220 s
Activated Clotting Time: 179 s
Activated Clotting Time: 163 s

## 2024-01-15 MED ORDER — CLOPIDOGREL BISULFATE 75 MG PO TABS
ORAL_TABLET | ORAL | Status: AC
  Filled 2024-01-15: qty 1

## 2024-01-15 MED ORDER — LIDOCAINE HCL (PF) 1 % IJ SOLN
INTRAMUSCULAR | Status: AC
  Filled 2024-01-15: qty 30

## 2024-01-15 MED ORDER — MIDAZOLAM HCL 2 MG/2ML IJ SOLN
INTRAMUSCULAR | Status: AC
  Filled 2024-01-15: qty 2

## 2024-01-15 MED ORDER — ASPIRIN 81 MG PO TBEC: 81.0000 mg | DELAYED_RELEASE_TABLET | Freq: Every day | ORAL | Status: AC

## 2024-01-15 MED ORDER — INSULIN ASPART 100 UNIT/ML IJ SOLN
0.0000 [IU] | Freq: Three times a day (TID) | INTRAMUSCULAR | Status: DC
  Administered 2024-01-15: 7 [IU] via SUBCUTANEOUS
  Filled 2024-01-15: qty 7

## 2024-01-15 MED ORDER — CLOPIDOGREL BISULFATE 75 MG PO TABS: 75.0000 mg | ORAL_TABLET | Freq: Every day | ORAL | Status: DC

## 2024-01-15 MED ORDER — FENTANYL CITRATE (PF) 100 MCG/2ML IJ SOLN
INTRAMUSCULAR | Status: AC
  Filled 2024-01-15: qty 2

## 2024-01-15 MED ORDER — CLOPIDOGREL BISULFATE 300 MG PO TABS
ORAL_TABLET | ORAL | Status: DC | PRN
  Administered 2024-01-15: 75 mg via ORAL

## 2024-01-15 MED ORDER — HEPARIN (PORCINE) IN NACL 1000-0.9 UT/500ML-% IV SOLN
INTRAVENOUS | Status: DC | PRN
  Administered 2024-01-15: 1000 mL

## 2024-01-15 MED ORDER — HEPARIN SODIUM (PORCINE) 1000 UNIT/ML IJ SOLN
INTRAMUSCULAR | Status: DC | PRN
  Administered 2024-01-15: 8000 [IU] via INTRAVENOUS

## 2024-01-15 MED ORDER — OXYCODONE HCL 5 MG PO TABS
5.0000 mg | ORAL_TABLET | ORAL | Status: DC | PRN
  Administered 2024-01-15: 5 mg via ORAL

## 2024-01-15 MED ORDER — ASPIRIN 81 MG PO TBEC: 81.0000 mg | DELAYED_RELEASE_TABLET | Freq: Every day | ORAL | Status: DC

## 2024-01-15 MED ORDER — OXYCODONE HCL 5 MG PO TABS
ORAL_TABLET | ORAL | Status: AC
  Filled 2024-01-15: qty 1

## 2024-01-15 MED ORDER — MIDAZOLAM HCL (PF) 2 MG/2ML IJ SOLN
INTRAMUSCULAR | Status: DC | PRN
  Administered 2024-01-15: 1 mg via INTRAVENOUS

## 2024-01-15 MED ORDER — HYDRALAZINE HCL 20 MG/ML IJ SOLN: 5.0000 mg | INTRAMUSCULAR | Status: DC | PRN

## 2024-01-15 MED ORDER — INSULIN ASPART 100 UNIT/ML IJ SOLN: 0.0000 [IU] | Freq: Three times a day (TID) | INTRAMUSCULAR | Status: DC

## 2024-01-15 MED ORDER — SODIUM CHLORIDE 0.9 % IV SOLN: INTRAVENOUS | Status: AC

## 2024-01-15 MED ORDER — HEPARIN SODIUM (PORCINE) 1000 UNIT/ML IJ SOLN
INTRAMUSCULAR | Status: AC
  Filled 2024-01-15: qty 10

## 2024-01-15 MED ORDER — LABETALOL HCL 5 MG/ML IV SOLN: 10.0000 mg | INTRAVENOUS | Status: DC | PRN

## 2024-01-15 MED ORDER — ASPIRIN 81 MG PO CHEW
CHEWABLE_TABLET | ORAL | Status: AC
  Filled 2024-01-15: qty 1

## 2024-01-15 MED ORDER — SODIUM CHLORIDE 0.9% FLUSH: 3.0000 mL | Freq: Two times a day (BID) | INTRAVENOUS | Status: DC

## 2024-01-15 MED ORDER — SODIUM CHLORIDE 0.9 % IV SOLN: INTRAVENOUS | Status: DC

## 2024-01-15 MED ORDER — SODIUM CHLORIDE 0.9 % IV SOLN: 250.0000 mL | INTRAVENOUS | Status: DC | PRN

## 2024-01-15 MED ORDER — LIDOCAINE HCL (PF) 1 % IJ SOLN
INTRAMUSCULAR | Status: DC | PRN
  Administered 2024-01-15: 15 mL via INTRADERMAL

## 2024-01-15 MED ORDER — FENTANYL CITRATE (PF) 100 MCG/2ML IJ SOLN
INTRAMUSCULAR | Status: DC | PRN
  Administered 2024-01-15: 25 ug via INTRAVENOUS

## 2024-01-15 MED ORDER — ONDANSETRON HCL 4 MG/2ML IJ SOLN: 4.0000 mg | Freq: Four times a day (QID) | INTRAMUSCULAR | Status: DC | PRN

## 2024-01-15 MED ORDER — SODIUM CHLORIDE 0.9% FLUSH: 3.0000 mL | INTRAVENOUS | Status: DC | PRN

## 2024-01-15 MED ORDER — ASPIRIN 81 MG PO CHEW
CHEWABLE_TABLET | ORAL | Status: DC | PRN
  Administered 2024-01-15: 81 mg via ORAL

## 2024-01-15 MED ORDER — IODIXANOL 320 MG/ML IV SOLN
INTRAVENOUS | Status: DC | PRN
  Administered 2024-01-15: 90 mL via INTRA_ARTERIAL

## 2024-01-15 MED ORDER — OXYCODONE HCL 5 MG PO TABS
ORAL_TABLET | ORAL | Status: AC
  Administered 2024-01-15: 5 mg via ORAL
  Filled 2024-01-15: qty 1

## 2024-01-15 MED ORDER — ACETAMINOPHEN 325 MG PO TABS: 650.0000 mg | ORAL_TABLET | ORAL | Status: DC | PRN

## 2024-01-15 NOTE — Progress Notes (Signed)
 Patient and patient daughter given discharge instructions, education provided no further questions at this time. Patient able to ambulate and void before discharge. Able to tolerate PO intake. Patient site is clean, dry, intact with no hematoma noted upon discharge.

## 2024-01-15 NOTE — Op Note (Signed)
 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  Ultrasound-guided access left common femoral artery 2.  Aortogram with catheter selection of aorta 3.  Right lower extremity arteriogram with catheter selection of the anterior tibial artery 4.  Right anterior tibial angioplasty for subtotal occlusion (3 mm x 150 mm coyote and 2.5 mm x 80 mm coyote) 5.  Right peroneal angioplasty (3 mm x 150 mm coyote) 6.  60 minutes of monitored moderate conscious sedation time  Indications: Patient is a 74 year old male well-known to vascular surgery having previous undergone left common femoral to above-knee popliteal bypass with PTFE by Dr. Gerlean and also right common iliac stenting by Dr. Laurence.  He presents for right lower extremity angiogram with possible invention due to tissue loss with a wound after risks and benefits discussed.  Findings:   Ultrasound-guided access left common femoral artery.  Aortogram showed patent infrarenal aorta.  The right common iliac stent was patent without flow-limiting stenosis although this looks undersized.  Both iliacs are otherwise patent including the external iliacs.    On the right the common femoral and profunda are patent without flow-limiting stenosis.  SFA is patent with diffuse disease that does not appear flow-limiting.  The popliteal artery is patent without flow-limiting stenosis.  There is only peroneal runoff with moderate calcified proximal 60% stenosis.  The right anterior tibial was selected with catheter injection and showed subtotal occlusion with high-grade stenosis in the distal vessel.  There was also a high-grade over 70% stenosis at the ostium of the anterior tibial.  The dorsalis pedis occludes in the foot.  Ultimately I was able  to get down the anterior tibial all the way into the foot.  I was able to open the subtotal occlusion with a 3 mm x 150 mm angioplasty balloon throughout the anterior tibial including at the ostium all the way down to the distal segment of the vessel.  At the ankle joint we used 2.5 mm balloon where the artery was smaller.  Widely patent vessel at completion although the dorsalis pedis is occluded in the foot but there is brisk outflow through collaterals.  The peroneal was then selected and this was treated with a 3 mm x 150 mm angioplasty balloon in the proximal vessel.  Two-vessel runoff at completion.   Procedure:  The patient was identified in the holding area and taken to room 8.  The patient was then placed supine on the table and prepped and draped in the usual sterile fashion.  A time out was called.  Patient received Versed  and fentanyl  for conscious moderate sedation.  Vital signs monitored including heart rate, respiratory rate, oxygenation, and blood pressure.  I was present for all moderate sedation.  Ultrasound was used to evaluate the left common femoral artery.  It was patent .  A digital ultrasound image was acquired.  A micropuncture needle was used to access the left common femoral artery under ultrasound guidance.  An 018 wire was advanced without resistance and a micropuncture sheath was placed.  The 018 wire was removed and a benson wire was placed.  The micropuncture sheath was exchanged for a 5 french sheath.  An omniflush catheter was advanced over the wire to the level of L-1.  An abdominal angiogram was obtained.  Next, using the omniflush  catheter and a benson wire, the aortic bifurcation was crossed and the catheter was placed into theright external iliac artery and right runoff was obtained.  Ultimately elected for intervention.  I placed a Glidewire advantage down the right SFA and upsized to a 5 French catapult sheath in the left groin over the aortic bifurcation.  The patient was  given 100 units/kg IV heparin .  I then used a quick cross catheter to get all the way down into the anterior tibial with catheter selection and we did injection to confirm that the anterior tibial had a subtotal occlusion with a high-grade calcified lesion distally with retrograde flow.  I then used a 014 Glidewire advantage I was able to get down the anterior tibial through the subtotal occlusion into the foot.  I then selected a 3 mm x 150 mm angioplasty balloon and treated the entire anterior tibial including up to the ostium where there was a second flow-limiting stenosis.  I selected a 2.5 mm x 80 mm coyote that was treated in the distal AT across the ankle joint because the dorsalis pedis occludes and the vessel is small.  Much more brisk flow down anterior tibia with filling into the foot including the plantar arch through collaterals.  I then selected the peroneal artery with my wire and treated the proximal peroneal with a 3 mm x 150 mm Sterling and treated this with angioplasty.  Now two-vessel runoff.  Satisfied with results wires and catheters were removed.  Short 5 French sheath was placed in left groin.  Given history of left groin bypass I took him to holding for sheath removal.   Plan: Patient will need aspirin  Plavix  statin.  Optimized from vascular surgery.  Will arrange follow-up in 1 month.  Betadine  paint to the toe.  Lonni DOROTHA Gaskins, MD [facility] Office: [phone]   "

## 2024-01-15 NOTE — H&P (Signed)
 History and Physical Interval Note:  01/15/2024 8:31 AM  [name]  has presented today for surgery, with the diagnosis of failing bypass graft.  The various methods of treatment have been discussed with the patient and family. After consideration of risks, benefits and other options for treatment, the patient has consented to  Procedures: ABDOMINAL AORTOGRAM W/LOWER EXTREMITY (N/A) as a surgical intervention.  The patient's history has been reviewed, patient examined, no change in status, stable for surgery.  I have reviewed the patient's chart and labs.  Questions were answered to the patient's satisfaction.     [name]   HISTORY AND PHYSICAL        CC:  follow up. Requesting Provider:  [name], MD   HPI: This is a 74 y.o. male who is here today for follow up for PAD.  Pt has hx of  left femoral to popliteal (AK) bypass with PTFE on 11/06/2005 by Dr. Gerlean, redo left CFA to AK popliteal bypass with PTFE on 06/22/2010 by Dr. Gerlean,  right subintimal angioplasty and stenting of right CIA 10/03/2011 by Dr. Laurence, angiogram with drug coated angioplasty right CIA 01/16/2017 by Dr. Laurence, angiogram with right mid SFA angioplasty with drug coated balloon, right proximal ATA angioplasty 03/09/2020 by Dr. Gaskins.  Angiogram 12/02/2022 by Dr. Pearline that revealed patent left femoral popliteal bypass.   Pt was last seen 09/05/2023 and at that time, he was having pain in his hips, knees and ankles due to RA and stated it made it hard for him to walk.  He was not having any claudication, rest pain or tissue loss.  His duplexes were stable and his Pletal  was discontinued bc his rheumatologist wanted to start plaquenil .  He was scheduled for 3 month follow up and instructed to continue plavix  and statin.    Pt saw his podiatrist on 12/24/2023 and had painful right 3rd toe.     The pt returns today for follow up.  He states he has not been able to sleep for the past 3 nights due to pain in  the 3rd toe on the right foot.  He states over the past 3 days it has started draining.   He states it is interfering in his ability to walk.  He states it does feel a little better when he hangs it off the side of the bed.  He denies any pain in the left leg or foot.     The pt is on a statin for cholesterol management.    The pt is not on an aspirin .    Other AC:  Plavix  The pt is on BB for hypertension.  The pt is  on diabetic medication. Tobacco hx:  former   Pt does not have family hx of AAA.       Past Medical History:  Diagnosis Date   Anemia     Bronchitis     Carotid artery disease     Cervical spine disease      Neck surgery in 2007 with Dr. Leeann   Collagen vascular disease     COPD (chronic obstructive pulmonary disease) (HCC)     Coronary atherosclerosis of native coronary artery      Nonobstrtuctive by catheterization 2007, 2013, 2021   DVT (deep venous thrombosis) (HCC)     Essential hypertension     GERD (gastroesophageal reflux disease)     Hyperlipidemia     PAD (peripheral artery disease)  s/p left fem-pop bypass 2007; PTA and stenting of right iliac artery 09/2011   Rheumatoid arthritis(714.0)     Type 2 diabetes mellitus [facility])                 Past Surgical History:  Procedure Laterality Date   ABDOMINAL AORTAGRAM N/A 10/03/2011    Procedure: ABDOMINAL EZELLA;  Surgeon: Redell LITTIE Door, MD;  Location: [facility] CATH LAB;  Service: Cardiovascular;  Laterality: N/A;   ABDOMINAL AORTOGRAM W/LOWER EXTREMITY N/A 01/16/2017    Procedure: ABDOMINAL AORTOGRAM W/LOWER EXTREMITY;  Surgeon: Door Redell LITTIE, MD;  Location: [facility] INVASIVE CV LAB;  Service: Cardiovascular;  Laterality: N/A;   ABDOMINAL AORTOGRAM W/LOWER EXTREMITY Bilateral 08/04/2019    Procedure: ABDOMINAL AORTOGRAM W/LOWER EXTREMITY;  Surgeon: Gretta Brett PARAS, MD;  Location: [facility] INVASIVE CV LAB;  Service: Cardiovascular;  Laterality: Bilateral;   ABDOMINAL AORTOGRAM W/LOWER EXTREMITY N/A 03/09/2020     Procedure: ABDOMINAL AORTOGRAM W/LOWER EXTREMITY;  Surgeon: Gretta Brett PARAS, MD;  Location: [facility] INVASIVE CV LAB;  Service: Cardiovascular;  Laterality: N/A;   ABDOMINAL AORTOGRAM W/LOWER EXTREMITY N/A 12/02/2022    Procedure: ABDOMINAL AORTOGRAM W/LOWER EXTREMITY;  Surgeon: Pearline Norman RAMAN, MD;  Location: [facility] INVASIVE CV LAB;  Service: Cardiovascular;  Laterality: N/A;   BIOPSY N/A 12/28/2021    Procedure: BIOPSY;  Surgeon: Cindie Carlin POUR, DO;  Location: [facility] ENDO SUITE;  Service: Endoscopy;  Laterality: N/A;   CATARACT EXTRACTION W/PHACO Right 04/18/2023    Procedure: PHACOEMULSIFICATION, CATARACT, WITH IOL INSERTION;  Surgeon: Juli Blunt, MD;  Location: [facility] ORS;  Service: Ophthalmology;  Laterality: Right;  CDE: 1.81   CATARACT EXTRACTION W/PHACO Left 05/02/2023    Procedure: PHACOEMULSIFICATION, CATARACT, WITH IOL INSERTION;  Surgeon: Juli Blunt, MD;  Location: [facility] ORS;  Service: Ophthalmology;  Laterality: Left;  CDE: 1.17   CERVICAL DISC SURGERY       COLONOSCOPY   2013    Hyperplastic polyp   COLONOSCOPY WITH PROPOFOL  N/A 02/17/2017    Surgeon: Shaaron Lamar HERO, MD; Many small and large mouth diverticula in the sigmoid, descending, transverse colon, internal hemorrhoids that were medium sized and grade 1.   COLONOSCOPY WITH PROPOFOL  N/A 12/28/2021    Surgeon: Cindie Carlin POUR, DO;  Diverticulosis in the sigmoid colon and descending colon.  Likely benign tumor in the ascending colon.   ESOPHAGOGASTRODUODENOSCOPY (EGD) WITH PROPOFOL  N/A 12/28/2021    Procedure: ESOPHAGOGASTRODUODENOSCOPY (EGD) WITH PROPOFOL ;  Surgeon: Cindie Carlin POUR, DO;  Location: [facility];  Service: Endoscopy;  Laterality: N/A;   FEMORAL-POPLITEAL BYPASS GRAFT   11/06/2005   ILIAC ARTERY STENT   10/03/2011    Right CIA stenting   INSERTION, STENT, DRUG-ELUTING, LACRIMAL CANALICULUS Right 04/18/2023    Procedure: INSERTION, STENT, DRUG-ELUTING, LACRIMAL CANALICULUS;  Surgeon: Juli Blunt, MD;   Location: [facility] ORS;  Service: Ophthalmology;  Laterality: Right;   INSERTION, STENT, DRUG-ELUTING, LACRIMAL CANALICULUS Left 05/02/2023    Procedure: INSERTION, STENT, DRUG-ELUTING, LACRIMAL CANALICULUS;  Surgeon: Juli Blunt, MD;  Location: [facility] ORS;  Service: Ophthalmology;  Laterality: Left;   LEFT HEART CATH AND CORONARY ANGIOGRAPHY N/A 04/14/2019    Procedure: LEFT HEART CATH AND CORONARY ANGIOGRAPHY;  Surgeon: Burnard Debby LABOR, MD;  Location: [facility] INVASIVE CV LAB;  Service: Cardiovascular;  Laterality: N/A;   MOUTH SURGERY Right 09/16/2023    had to cut into the jaw   PERIPHERAL VASCULAR BALLOON ANGIOPLASTY Right 01/16/2017    Procedure: PERIPHERAL VASCULAR BALLOON ANGIOPLASTY;  Surgeon: Door Redell LITTIE, MD;  Location: [facility] INVASIVE CV  LAB;  Service: Cardiovascular;  Laterality: Right;  common iliac     PERIPHERAL VASCULAR BALLOON ANGIOPLASTY   03/09/2020    Procedure: PERIPHERAL VASCULAR BALLOON ANGIOPLASTY;  Surgeon: Gretta Brett PARAS, MD;  Location: [facility] INVASIVE CV LAB;  Service: Cardiovascular;;   PR VEIN BYPASS GRAFT,AORTO-FEM-POP   06/22/2010    Redo Left Fem-Pop   Repair of left arm fracture       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 8 g 2   atorvastatin  (LIPITOR) 80 MG tablet TAKE 1 TABLET(80 MG) BY MOUTH DAILY 90 tablet 2   baclofen (LIORESAL) 10 MG tablet Take 10 mg by mouth 2 (two) times daily.       cilostazol  (PLETAL ) 100 MG tablet Take 100 mg by mouth daily.       ciprofloxacin (CILOXAN) 0.3 % ophthalmic solution Place 1 drop into the right eye 4 (four) times daily.       clopidogrel  (PLAVIX ) 75 MG tablet Take 1 tablet (75 mg total) by  mouth daily. 30 tablet 11   dexlansoprazole  (DEXILANT ) 60 MG capsule Take 1 capsule (60 mg total) by mouth daily. 30 capsule 11   diclofenac  Sodium (VOLTAREN ) 1 % GEL Apply 2 gram  to painful foot and ankle joint twice daily. 150 g 1   empagliflozin (JARDIANCE) 25 MG TABS tablet Take 25 mg by mouth in the morning.       glipiZIDE (GLUCOTROL) 5 MG tablet Take 5-10 mg by mouth See admin instructions.       HYDROcodone-acetaminophen  (NORCO) 7.5-325 MG per tablet Take 1 tablet by mouth 2 (two) times daily as needed (pain.).       hydrocortisone  2.5 % lotion Apply topically daily. Apply to affected area of face daily x 1 month then decrease to once a day Monday, Wednesday, Friday 59 mL 11   hydroxychloroquine  (PLAQUENIL ) 200 MG tablet Take one tablet twice daily Mondays through Fridays only. DO NOT TAKE SATURDAY OR SUNDAY. 120 tablet 0   JANUVIA 100 MG tablet Take 100 mg by mouth in the morning.       ketoconazole  (NIZORAL ) 2 % cream Apply to both feet and between toes once daily for 6 weeks. 60 g 1   ketorolac  (ACULAR ) 0.5 % ophthalmic solution Place 1 drop into the right eye 4 (four) times daily.       LANTUS SOLOSTAR 100 UNIT/ML Solostar Pen Inject 10 Units into the skin at bedtime.       meloxicam (MOBIC) 15 MG tablet Take 15 mg by mouth daily as needed.       metoprolol  succinate (TOPROL -XL) 25 MG 24 hr tablet Take 0.5 tablets (12.5 mg total) by mouth daily. 45 tablet 3   nitroGLYCERIN  (NITROSTAT ) 0.4 MG SL tablet Place 1 tablet (0.4 mg total) under the tongue every 5 (five) minutes x 3 doses as needed for chest pain (If no relief after 3rd dose, GO TO ER.). 25 tablet 3   Omega-3 Fatty Acids (OMEGA-3 FISH OIL PO) Take 1 capsule by mouth daily.  ONE TOUCH ULTRA TEST test strip         OneTouch Delica Lancets 33G MISC Apply topically.       Plecanatide  (TRULANCE ) 3 MG TABS Take 1 tablet (3 mg total) by mouth daily. (Patient taking differently: Take 3 mg by mouth as needed.) 30 tablet 5    sildenafil (REVATIO) 20 MG tablet Take 40-60 mg by mouth daily as needed for erectile dysfunction.       SYMBICORT  160-4.5 MCG/ACT inhaler INHALE 2 PUFFS FIRST THING IN THE MORNING AND THEN 2 PUFFS 12 HOURS LATER 10.2 g 5   Tiotropium Bromide  (SPIRIVA  RESPIMAT) 2.5 MCG/ACT AERS INHALE 2 PUFFS INTO THE LUNGS DAILY 4 g 3   XELJANZ  5 MG TABS Take 1 tablet (5 mg total) by mouth daily. 90 tablet 0      No current facility-administered medications for this visit.             Family History  Problem Relation Age of Onset   Heart attack Mother 78   Deep vein thrombosis Mother     Diabetes Mother     Hyperlipidemia Mother     Hypertension Mother     Peripheral vascular disease Mother     Stroke Father     Diabetes Father     Hyperlipidemia Father     Hypertension Father     Peripheral vascular disease Father     Diabetes Sister     Hyperlipidemia Sister     Hypertension Sister     Diabetes Brother     Hyperlipidemia Brother     Cancer Brother     Deep vein thrombosis Brother     Hypertension Brother     Peripheral vascular disease Brother     Diabetes Daughter     Hyperlipidemia Son     Cancer Son     Hypertension Son     Colon cancer Neg Hx            Social History         Socioeconomic History   Marital status: Single      Spouse name: Not on file   Number of children: Not on file   Years of education: Not on file   Highest education level: Not on file  Occupational History   Not on file  Tobacco Use   Smoking status: Former      Current packs/day: 0.00      Average packs/day: 1.5 packs/day for 45.0 years (67.5 ttl pk-yrs)      Types: Cigarettes, Cigars      Start date: 09/01/1966      Quit date: 09/01/2011      Years since quitting: 12.3      Passive exposure: Never   Smokeless tobacco: Never  Vaping Use   Vaping status: Never Used  Substance and Sexual Activity   Alcohol use: Not Currently      Comment: occasionally   Drug use: No   Sexual activity: Not  Currently      Birth control/protection: None  Other Topics Concern   Not on file  Social History Narrative    The patient lives in [place].     Social Drivers of Health        Tobacco Use: Medium Risk (12/24/2023)    Patient History     Smoking Tobacco Use: Former     Smokeless Tobacco Use: Never     Passive Exposure: Never  Physicist, Medical Strain: Not on file  Food Insecurity: Not on file  Transportation Needs: Not on file  Physical Activity: Not on file  Stress: Not on file  Social Connections: Not on file  Intimate Partner Violence: Not on file  Depression (PHQ2-9): Not on file  Alcohol Screen: Not on file  Housing: Not on file  Utilities: Not on file  Health Literacy: Not on file        REVIEW OF SYSTEMS:    [X]  denotes positive finding, [ ]  denotes negative finding Cardiac   Comments:  Chest pain or chest pressure:      Shortness of breath upon exertion:      Short of breath when lying flat:      Irregular heart rhythm:             Vascular      Pain in calf, thigh, or hip brought on by ambulation:      Pain in feet at night that wakes you up from your sleep:       Blood clot in your veins:      Leg swelling:              Pulmonary      Oxygen at home:      Productive cough:       Wheezing:              Neurologic      Sudden weakness in arms or legs:       Sudden numbness in arms or legs:       Sudden onset of difficulty speaking or slurred speech:      Temporary loss of vision in one eye:       Problems with dizziness:              Gastrointestinal      Blood in stool:       Vomited blood:              Genitourinary      Burning when urinating:       Blood in urine:             Psychiatric      Major depression:              Hematologic      Bleeding problems:      Problems with blood clotting too easily:             Skin      Rashes or ulcers:             Constitutional      Fever or chills:          PHYSICAL EXAMINATION:       Today's Vitals    01/12/24 1118  BP: 130/84  Pulse: 88  Temp: 97.8 F (36.6 C)  TempSrc: Temporal  Weight: 161 lb 11.2 oz (73.3 kg)  PainLoc: Toe    Body mass index is 23.88 kg/m.     General:  WDWN in NAD; vital signs documented above Gait: Not observed HENT: WNL, normocephalic Pulmonary: normal non-labored breathing , without wheezing Cardiac: regular HR, without carotid bruits Abdomen: soft, NT; aortic pulse is not palpable Skin: without rashes Vascular Exam/Pulses:   Right Left  Radial 2+ (normal) 2+ (normal)  Femoral 2+ (normal) 2+ (normal)  DP absent absent  PT Monophasic  monophasic  Peroneal monophasic monophasic    Extremities: right 3rd toe as pictured  Musculoskeletal: no muscle wasting or atrophy  Neurologic: A&O X 3 Psychiatric:  The pt has Normal affect.     Non-Invasive Vascular Imaging:   ABI's/TBI's on 01/12/2024: Right:  [place]/0.18 - Great toe pressure: 27 Left:  [place]/0.28 - Great toe pressure: 42   Arterial duplex on 01/12/2024: +----------+--------+-----+--------+-----------+--------+  LEFT     PSV cm/sRatioStenosisWaveform   Comments  +----------+--------+-----+--------+-----------+--------+  POP Prox  47                   triphasic            +----------+--------+-----+--------+-----------+--------+  POP Mid   44                   triphasic            +----------+--------+-----+--------+-----------+--------+  POP Distal41                   multiphasic          +----------+--------+-----+--------+-----------+--------+  TP Trunk  57                   multiphasic          +----------+--------+-----+--------+-----------+--------+     Left Graft #1: CFA -AK Pop artery BPG  +--------------------+--------+--------+---------+--------+                     PSV cm/sStenosisWaveform Comments  +--------------------+--------+--------+---------+--------+  Inflow             82              biphasic            +--------------------+--------+--------+---------+--------+  Proximal Anastomosis114             triphasic          +--------------------+--------+--------+---------+--------+  Proximal Graft      61              biphasic           +--------------------+--------+--------+---------+--------+  Mid Graft           51              triphasic          +--------------------+--------+--------+---------+--------+  Distal Graft        37              biphasic           +--------------------+--------+--------+---------+--------+  Distal Anastomosis  45              triphasic          +--------------------+--------+--------+---------+--------+  Outflow            42              triphasic          +--------------------+--------+--------+---------+--------+   Summary:  Left: Patent left common femoral to above knee popliteal artery bypass graft. There is no evidence of stenosis within the graft.    Summary:  Abdominal Aorta: No evidence of an abdominal aortic aneurysm was visualized. 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 Patent right common iliac artery stent, there is no evidence of in stent stenosis. The right common iliac artery is dilated. Ectatic mid/distal right common iliac artery, with largest diameter of 1.9 cm.  IVC/Iliac: There is no evidence of thrombus involving the IVC    Previous ABI's/TBI's on 09/05/2023: Right:  [place]/0.39 - Great toe pressure: 55 Left:  /0.51 - Great toe pressure:  73   Previous arterial  duplex on 09/05/2023: Left: Patent common femoral artery to above-the-knee popliteal artery  without evidence of stenosis.    Abdominal Aorta: No evidence of an abdominal aortic aneurysm was  visualized. There is evidence of abnormal dilation of the Right Common Iliac artery. Ectatic mid/distal right common iliac artery, with largest diameter of 1.9 cm.      ASSESSMENT/PLAN:: 74 y.o. male here for follow up for PAD with hx  of  left femoral to popliteal (AK) bypass with PTFE on 11/06/2005 by Dr. Gerlean, redo left CFA to AK popliteal bypass with PTFE on 06/22/2010 by Dr. Gerlean,  right subintimal angioplasty and stenting of right CIA 10/03/2011 by Dr. Laurence, angiogram with drug coated angioplasty right CIA 01/16/2017 by Dr. Laurence, angiogram with right mid SFA angioplasty with drug coated balloon, right proximal ATA angioplasty 03/09/2020 by Dr. Gretta.  Angiogram 12/02/2022 by Dr. Pearline that revealed patent left femoral popliteal bypass.   PAD LLE -pt with decreased velocities in the distal graft and anastomosis and therefore, concern for graft failure.       PAD RLE -ABI with decreased TBI and toe pressures since August.  He now has new right 3rd toe pain with discoloration and some clear drainage from the toe.  I did send Bactrim  DS to his pharmacy for 7 day supply.     -give the concerns listed above, he will undergo BLE arteriography on 01/15/2024 with Dr. Gretta with possible intervention.  I discussed with him that we would evaluate his bypass given the decreased velocities and will evaluate blood flow in the RLE given CLI.  He has palpable femoral pulses bilaterally.   Discussed that he may be at risk for toe amputation.  Also discussed pending findings, he may need bypass in the right leg.  He has a court date on 01/22/2024.   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  He did not have any complaints of abdominal pain today.    -he tried to call his daughter Eleanor a couple of times while we were in the exam room but she did not answer.  He knows to let her know she can call if she needs to speak with me.    -continue plavix /statin.  He states he was taken off of the aspirin .      Lucie Apt, [facility] [phone]   Clinic MD:   Serene

## 2024-01-15 NOTE — Progress Notes (Signed)
 Site area: Left groin Site Prior to Removal:  Level 0 Pressure Applied For:25 minutes Manual:   Yes Patient Status During Pull:  Stable Post Pull Site:  Level 0 Post Pull Instructions Given:  Yes Post Pull Pulses Present:  Dressing Applied:  gauze and tegaderm Bedrest begins @ 1342 Comments:

## 2024-01-16 ENCOUNTER — Other Ambulatory Visit: Payer: Self-pay

## 2024-01-16 ENCOUNTER — Telehealth: Payer: Self-pay

## 2024-01-16 NOTE — Telephone Encounter (Signed)
 Patient called reporting swelling and pain to R foot s/p arteriogram on 01/15/24.  Advised that pain and swelling are normal immediately after procedure.  Ambulate as tolerated. Foot may be elevated to help with the swelling.  Advised to keep any ulcers clean and dry.  Advised to complete the course of antibiotic previously prescribed.

## 2024-01-17 ENCOUNTER — Encounter (HOSPITAL_COMMUNITY): Payer: Self-pay | Admitting: Vascular Surgery

## 2024-01-17 ENCOUNTER — Ambulatory Visit (HOSPITAL_COMMUNITY)
Admission: RE | Admit: 2024-01-17 | Discharge: 2024-01-17 | Disposition: A | Source: Ambulatory Visit | Attending: Acute Care | Admitting: Acute Care

## 2024-01-17 DIAGNOSIS — R911 Solitary pulmonary nodule: Secondary | ICD-10-CM | POA: Insufficient documentation

## 2024-01-17 DIAGNOSIS — Z87891 Personal history of nicotine dependence: Secondary | ICD-10-CM | POA: Insufficient documentation

## 2024-01-24 ENCOUNTER — Encounter (HOSPITAL_COMMUNITY): Payer: Self-pay

## 2024-01-24 NOTE — ED Triage Notes (Signed)
 Had bypass done on right legat [facility] last Thursday, right foot is still swollen and there is pus coming out of it. My toes is red. PMH of diabetes

## 2024-01-26 ENCOUNTER — Other Ambulatory Visit (HOSPITAL_COMMUNITY): Payer: Self-pay

## 2024-01-26 ENCOUNTER — Other Ambulatory Visit: Payer: Self-pay | Admitting: Acute Care

## 2024-01-26 ENCOUNTER — Other Ambulatory Visit: Payer: Self-pay

## 2024-01-26 ENCOUNTER — Other Ambulatory Visit: Payer: Self-pay | Admitting: Physician Assistant

## 2024-01-26 ENCOUNTER — Encounter: Payer: Self-pay | Admitting: Physician Assistant

## 2024-01-26 ENCOUNTER — Ambulatory Visit: Admitting: Physician Assistant

## 2024-01-26 VITALS — BP 151/75 | HR 96 | Ht 69.0 in | Wt 162.9 lb

## 2024-01-26 DIAGNOSIS — L03115 Cellulitis of right lower limb: Secondary | ICD-10-CM

## 2024-01-26 DIAGNOSIS — Z122 Encounter for screening for malignant neoplasm of respiratory organs: Secondary | ICD-10-CM

## 2024-01-26 DIAGNOSIS — I70221 Atherosclerosis of native arteries of extremities with rest pain, right leg: Secondary | ICD-10-CM

## 2024-01-26 DIAGNOSIS — Z87891 Personal history of nicotine dependence: Secondary | ICD-10-CM

## 2024-01-26 MED ORDER — DOXYCYCLINE HYCLATE 100 MG PO CAPS
100.0000 mg | ORAL_CAPSULE | Freq: Two times a day (BID) | ORAL | 0 refills | Status: DC
  Filled 2024-01-26: qty 28, 14d supply, fill #0

## 2024-01-26 MED ORDER — CIPROFLOXACIN HCL 500 MG PO TABS
500.0000 mg | ORAL_TABLET | Freq: Two times a day (BID) | ORAL | 0 refills | Status: DC
  Filled 2024-01-26: qty 20, 10d supply, fill #0

## 2024-01-26 NOTE — Progress Notes (Unsigned)
 "   Office Note   History of Present Illness   [name] is a 74 y.o. (September 08, 1950) male who presents for surveillance of PAD. They have a history of ***  The patient returns today for follow up. He/she denies any recent medical history changes. The patient also denies any claudication, rest pain, or tissue loss of the lower extremities.  Current Outpatient Medications  Medication Sig Dispense Refill   albuterol  (PROAIR  HFA) 108 (90 Base) MCG/ACT inhaler Inhale 2 puffs into the lungs every 6 (six) hours as needed for wheezing or shortness of breath. 8 g 2   aspirin  EC 81 MG tablet Take 1 tablet (81 mg total) by mouth daily. Swallow whole.     atorvastatin  (LIPITOR) 80 MG tablet TAKE 1 TABLET(80 MG) BY MOUTH DAILY 90 tablet 2   clopidogrel  (PLAVIX ) 75 MG tablet Take 1 tablet (75 mg total) by mouth daily. 30 tablet 11   dexlansoprazole  (DEXILANT ) 60 MG capsule Take 1 capsule (60 mg total) by mouth daily. 30 capsule 11   diclofenac  Sodium (VOLTAREN ) 1 % GEL Apply 2 gram  to painful foot and ankle joint twice daily. 150 g 1   empagliflozin (JARDIANCE) 25 MG TABS tablet Take 25 mg by mouth in the morning.     glipiZIDE (GLUCOTROL) 5 MG tablet Take 5-10 mg by mouth See admin instructions. Take 10 mg by mouth in the morning and 5 mg in the evening     HYDROcodone-acetaminophen  (NORCO) 7.5-325 MG per tablet Take 1 tablet by mouth 2 (two) times daily as needed for moderate pain (pain score 4-6).     hydrocortisone  2.5 % lotion Apply topically daily. Apply to affected area of face daily x 1 month then decrease to once a day Monday, Wednesday, Friday 59 mL 11   hydroxychloroquine  (PLAQUENIL ) 200 MG tablet Take one tablet twice daily Mondays through Fridays only. DO NOT TAKE SATURDAY OR SUNDAY. 120 tablet 0   JANUVIA 100 MG tablet Take 100 mg by mouth in the morning.     LANTUS SOLOSTAR 100 UNIT/ML Solostar Pen Inject 20 Units into the skin at bedtime.     metoprolol  succinate (TOPROL -XL) 25 MG 24  hr tablet Take 0.5 tablets (12.5 mg total) by mouth daily. 45 tablet 3   nitroGLYCERIN  (NITROSTAT ) 0.4 MG SL tablet Place 1 tablet (0.4 mg total) under the tongue every 5 (five) minutes x 3 doses as needed for chest pain (If no relief after 3rd dose, GO TO ER.). 25 tablet 3   ONE TOUCH ULTRA TEST test strip      OneTouch Delica Lancets 33G MISC Apply topically.     Plecanatide  (TRULANCE ) 3 MG TABS Take 1 tablet (3 mg total) by mouth daily. (Patient taking differently: Take 3 mg by mouth daily as needed (constipation).) 30 tablet 5   sildenafil (REVATIO) 20 MG tablet Take 40-60 mg by mouth daily as needed for erectile dysfunction.     SYMBICORT  160-4.5 MCG/ACT inhaler INHALE 2 PUFFS FIRST THING IN THE MORNING AND THEN 2 PUFFS 12 HOURS LATER 10.2 g 5   Tiotropium Bromide  (SPIRIVA  RESPIMAT) 2.5 MCG/ACT AERS INHALE 2 PUFFS INTO THE LUNGS DAILY 4 g 3   XELJANZ  5 MG TABS Take 1 tablet (5 mg total) by mouth daily. 90 tablet 0   ciprofloxacin  (CIPRO ) 500 MG tablet Take 1 tablet (500 mg total) by mouth 2 (two) times daily for 10 days. 20 tablet 0   doxycycline  (VIBRAMYCIN ) 100 MG capsule Take 1 capsule (  100 mg total) by mouth 2 (two) times daily for 14 days. 28 capsule 0   ketoconazole  (NIZORAL ) 2 % cream Apply to both feet and between toes once daily for 6 weeks. (Patient not taking: Reported on 01/26/2024) 60 g 1   ketorolac  (ACULAR ) 0.5 % ophthalmic solution Place 1 drop into the right eye 4 (four) times daily. (Patient not taking: Reported on 01/26/2024)     No current facility-administered medications for this visit.    ***REVIEW OF SYSTEMS (negative unless checked):   Cardiac:  []  Chest pain or chest pressure? []  Shortness of breath upon activity? []  Shortness of breath when lying flat? []  Irregular heart rhythm?  Vascular:  []  Pain in calf, thigh, or hip brought on by walking? []  Pain in feet at night that wakes you up from your sleep? []  Blood clot in your veins? []  Leg  swelling?  Pulmonary:  []  Oxygen at home? []  Productive cough? []  Wheezing?  Neurologic:  []  Sudden weakness in arms or legs? []  Sudden numbness in arms or legs? []  Sudden onset of difficult speaking or slurred speech? []  Temporary loss of vision in one eye? []  Problems with dizziness?  Gastrointestinal:  []  Blood in stool? []  Vomited blood?  Genitourinary:  []  Burning when urinating? []  Blood in urine?  Psychiatric:  []  Major depression  Hematologic:  []  Bleeding problems? []  Problems with blood clotting?  Dermatologic:  []  Rashes or ulcers?  Constitutional:  []  Fever or chills?  Ear/Nose/Throat:  []  Change in hearing? []  Nose bleeds? []  Sore throat?  Musculoskeletal:  []  Back pain? []  Joint pain? []  Muscle pain?   Physical Examination   Vitals:   01/26/24 0959  BP: (!) 151/75  Pulse: 96  Weight: 162 lb 14.4 oz (73.9 kg)  Height: 5' 9 (1.753 m)   Body mass index is 24.06 kg/m.  General:  WDWN in NAD; vital signs documented above Gait: Not observed HENT: WNL, normocephalic Pulmonary: normal non-labored breathing Cardiac: regular Abdomen: soft, NT, no masses Skin: without rashes Vascular Exam/Pulses: brisk right DP and peroneal doppler signals Extremities: edema of right foot with macerated skin of 2nd-5th toes. Gangrenous changes to the right 3rd toe. Purulent drainage under the right great toenail Musculoskeletal: no muscle wasting or atrophy  Neurologic: A&O X 3;  No focal weakness or paresthesias are detected Psychiatric:  The pt has normal affect          Medical Decision Making   Arther Heisler is a 74 y.o. male who presents for surveillance of PAD  Based on the patient's vascular studies, their ABIs are essentially unchanged since last visit. *** Arterial duplex *** The patient denies any claudication, rest pain, or tissue loss. They have palpable/nonpalpable pedal pulses with *** doppler signals They will continue their ***  and follow up with our office in *** months/year with ABIs and ***   Ahmed Holster PA-C [facility] Office: [phone]  Clinic MD: ***  "

## 2024-01-27 ENCOUNTER — Ambulatory Visit: Admitting: Podiatry

## 2024-01-27 ENCOUNTER — Other Ambulatory Visit: Payer: Self-pay

## 2024-01-27 VITALS — BP 149/89 | HR 93 | Ht 69.0 in | Wt 162.0 lb

## 2024-01-27 DIAGNOSIS — I96 Gangrene, not elsewhere classified: Secondary | ICD-10-CM | POA: Diagnosis not present

## 2024-01-27 NOTE — Progress Notes (Signed)
"  °  Subjective:  Patient ID: [name], male    DOB: 01/21/1950,  MRN: [id number]  Chief Complaint  Patient presents with   Foot Problem    RM 1 R foot ischemic changes - referred by vascular vein as urgent ([facility] of [place]). Pt has swelling and discoloration of the right 2nd, 3rd and 4th toes. Pt states being prescribed antibiotics for infection of the right foot.    74 y.o. male presents with the above complaint. History confirmed with patient.  Had angiography and now has two-vessel runoff via peroneal and DP last week with vascular surgery  Objective:  Physical Exam: Foot temperature is warm has decent capillary fill time at the toes, weakly palpable DP pulse in the midfoot, he has early gangrenous changes most notably around the third toe     Assessment:   1. Gangrene of toe of right foot (HCC)      Plan:  Patient was evaluated and treated and all questions answered.  Moderate edema with early gangrenous changes, most of this is on the dorsal toe and will need time to demarcate.  Would like to give this time to continue to demarcate, utilize Betadine  applied to skin daily with dry dressings and gauze between toes.  Darco wedge shoe dispensed today to offload for ambulation.  Follow-up in 2 weeks to reevaluate.  Photographs taken.  Advised on signs and symptoms of worsening infection if he develops fevers chills nausea vomiting or purulent drainage notify me ASAP, complete current antibiotics of doxycycline  and Levaquin  Return in about 2 weeks (around 02/10/2024) for wound care.   "

## 2024-01-29 ENCOUNTER — Telehealth: Payer: Self-pay | Admitting: Lab

## 2024-01-29 ENCOUNTER — Other Ambulatory Visit: Payer: Self-pay | Admitting: Vascular Surgery

## 2024-01-29 DIAGNOSIS — I739 Peripheral vascular disease, unspecified: Secondary | ICD-10-CM

## 2024-01-29 DIAGNOSIS — L03115 Cellulitis of right lower limb: Secondary | ICD-10-CM

## 2024-01-29 DIAGNOSIS — I70221 Atherosclerosis of native arteries of extremities with rest pain, right leg: Secondary | ICD-10-CM

## 2024-01-29 NOTE — Telephone Encounter (Signed)
 Spoke to patient advised.

## 2024-01-29 NOTE — Telephone Encounter (Signed)
 Patient states is experiencing a lot of pain thinks infection is traveling to his leg wants doctor recommendations.

## 2024-02-03 NOTE — Telephone Encounter (Signed)
 Patient called again about the pain and infection in his foot. I reiterated that patient should go to the ER to be evaluated as he was advised to do on 1/22. Patient stated that he would do so later and if he could not make it until Tuesday (his appointment)

## 2024-02-04 ENCOUNTER — Inpatient Hospital Stay (HOSPITAL_COMMUNITY)
Admission: EM | Admit: 2024-02-04 | Discharge: 2024-02-06 | DRG: 240 | Disposition: A | Discharge status: Home / Self Care | Attending: Internal Medicine | Admitting: Internal Medicine

## 2024-02-04 ENCOUNTER — Emergency Department (HOSPITAL_COMMUNITY)

## 2024-02-04 ENCOUNTER — Other Ambulatory Visit: Payer: Self-pay

## 2024-02-04 ENCOUNTER — Encounter (HOSPITAL_COMMUNITY): Payer: Self-pay

## 2024-02-04 DIAGNOSIS — L089 Local infection of the skin and subcutaneous tissue, unspecified: Secondary | ICD-10-CM | POA: Diagnosis present

## 2024-02-04 DIAGNOSIS — E785 Hyperlipidemia, unspecified: Secondary | ICD-10-CM | POA: Diagnosis present

## 2024-02-04 DIAGNOSIS — L03115 Cellulitis of right lower limb: Secondary | ICD-10-CM | POA: Diagnosis present

## 2024-02-04 DIAGNOSIS — E1152 Type 2 diabetes mellitus with diabetic peripheral angiopathy with gangrene: Principal | ICD-10-CM | POA: Diagnosis present

## 2024-02-04 DIAGNOSIS — Z823 Family history of stroke: Secondary | ICD-10-CM

## 2024-02-04 DIAGNOSIS — Z7951 Long term (current) use of inhaled steroids: Secondary | ICD-10-CM

## 2024-02-04 DIAGNOSIS — Z7984 Long term (current) use of oral hypoglycemic drugs: Secondary | ICD-10-CM

## 2024-02-04 DIAGNOSIS — E119 Type 2 diabetes mellitus without complications: Secondary | ICD-10-CM

## 2024-02-04 DIAGNOSIS — Z7902 Long term (current) use of antithrombotics/antiplatelets: Secondary | ICD-10-CM

## 2024-02-04 DIAGNOSIS — L97418 Non-pressure chronic ulcer of right heel and midfoot with other specified severity: Secondary | ICD-10-CM | POA: Diagnosis present

## 2024-02-04 DIAGNOSIS — Z880 Allergy status to penicillin: Secondary | ICD-10-CM

## 2024-02-04 DIAGNOSIS — M069 Rheumatoid arthritis, unspecified: Secondary | ICD-10-CM | POA: Diagnosis present

## 2024-02-04 DIAGNOSIS — I2510 Atherosclerotic heart disease of native coronary artery without angina pectoris: Secondary | ICD-10-CM | POA: Diagnosis present

## 2024-02-04 DIAGNOSIS — Z87891 Personal history of nicotine dependence: Secondary | ICD-10-CM

## 2024-02-04 DIAGNOSIS — Z833 Family history of diabetes mellitus: Secondary | ICD-10-CM

## 2024-02-04 DIAGNOSIS — Z79899 Other long term (current) drug therapy: Secondary | ICD-10-CM

## 2024-02-04 DIAGNOSIS — Z8349 Family history of other endocrine, nutritional and metabolic diseases: Secondary | ICD-10-CM

## 2024-02-04 DIAGNOSIS — Z7982 Long term (current) use of aspirin: Secondary | ICD-10-CM

## 2024-02-04 DIAGNOSIS — I96 Gangrene, not elsewhere classified: Secondary | ICD-10-CM | POA: Diagnosis not present

## 2024-02-04 DIAGNOSIS — K219 Gastro-esophageal reflux disease without esophagitis: Secondary | ICD-10-CM | POA: Diagnosis present

## 2024-02-04 DIAGNOSIS — J449 Chronic obstructive pulmonary disease, unspecified: Secondary | ICD-10-CM | POA: Diagnosis present

## 2024-02-04 DIAGNOSIS — I739 Peripheral vascular disease, unspecified: Secondary | ICD-10-CM | POA: Diagnosis present

## 2024-02-04 DIAGNOSIS — E1165 Type 2 diabetes mellitus with hyperglycemia: Secondary | ICD-10-CM | POA: Diagnosis present

## 2024-02-04 DIAGNOSIS — E11621 Type 2 diabetes mellitus with foot ulcer: Secondary | ICD-10-CM | POA: Diagnosis present

## 2024-02-04 DIAGNOSIS — Z8249 Family history of ischemic heart disease and other diseases of the circulatory system: Secondary | ICD-10-CM

## 2024-02-04 DIAGNOSIS — Z95820 Peripheral vascular angioplasty status with implants and grafts: Secondary | ICD-10-CM

## 2024-02-04 DIAGNOSIS — Z794 Long term (current) use of insulin: Secondary | ICD-10-CM

## 2024-02-04 DIAGNOSIS — I10 Essential (primary) hypertension: Secondary | ICD-10-CM | POA: Diagnosis present

## 2024-02-04 LAB — CBC WITH DIFFERENTIAL/PLATELET
Abs Immature Granulocytes: 0.01 10*3/uL (ref 0.00–0.07)
Basophils Absolute: 0.1 10*3/uL (ref 0.0–0.1)
Basophils Relative: 1 %
Eosinophils Absolute: 0.2 10*3/uL (ref 0.0–0.5)
Eosinophils Relative: 3 %
HCT: 40.3 % (ref 39.0–52.0)
Hemoglobin: 12.3 g/dL — ABNORMAL LOW (ref 13.0–17.0)
Immature Granulocytes: 0 %
Lymphocytes Relative: 35 %
Lymphs Abs: 2.2 10*3/uL (ref 0.7–4.0)
MCH: 26.6 pg (ref 26.0–34.0)
MCHC: 30.5 g/dL (ref 30.0–36.0)
MCV: 87.2 fL (ref 80.0–100.0)
Monocytes Absolute: 0.5 10*3/uL (ref 0.1–1.0)
Monocytes Relative: 8 %
Neutro Abs: 3.3 10*3/uL (ref 1.7–7.7)
Neutrophils Relative %: 53 %
Platelets: 367 10*3/uL (ref 150–400)
RBC: 4.62 MIL/uL (ref 4.22–5.81)
RDW: 15.4 % (ref 11.5–15.5)
WBC: 6.3 10*3/uL (ref 4.0–10.5)
nRBC: 0 % (ref 0.0–0.2)

## 2024-02-04 LAB — COMPREHENSIVE METABOLIC PANEL WITH GFR
ALT: 9 U/L (ref 0–44)
AST: 22 U/L (ref 15–41)
Albumin: 4.2 g/dL (ref 3.5–5.0)
Alkaline Phosphatase: 74 U/L (ref 38–126)
Anion gap: 10 (ref 5–15)
BUN: 13 mg/dL (ref 8–23)
CO2: 24 mmol/L (ref 22–32)
Calcium: 9.4 mg/dL (ref 8.9–10.3)
Chloride: 103 mmol/L (ref 98–111)
Creatinine, Ser: 0.86 mg/dL (ref 0.61–1.24)
GFR, Estimated: >60 mL/min
Glucose, Bld: 73 mg/dL (ref 70–99)
Potassium: 4.5 mmol/L (ref 3.5–5.1)
Sodium: 138 mmol/L (ref 135–145)
Total Bilirubin: 0.3 mg/dL (ref 0.0–1.2)
Total Protein: 7.4 g/dL (ref 6.5–8.1)

## 2024-02-04 LAB — CBC
HCT: 40.1 % (ref 39.0–52.0)
Hemoglobin: 12.3 g/dL — ABNORMAL LOW (ref 13.0–17.0)
MCH: 26.4 pg (ref 26.0–34.0)
MCHC: 30.7 g/dL (ref 30.0–36.0)
MCV: 86.1 fL (ref 80.0–100.0)
Platelets: 316 10*3/uL (ref 150–400)
RBC: 4.66 MIL/uL (ref 4.22–5.81)
RDW: 15.3 % (ref 11.5–15.5)
WBC: 6.1 10*3/uL (ref 4.0–10.5)
nRBC: 0 % (ref 0.0–0.2)

## 2024-02-04 LAB — GLUCOSE, CAPILLARY
Glucose-Capillary: 87 mg/dL (ref 70–99)
Glucose-Capillary: 126 mg/dL — ABNORMAL HIGH (ref 70–99)

## 2024-02-04 LAB — CREATININE, SERUM
Creatinine, Ser: 0.98 mg/dL (ref 0.61–1.24)
GFR, Estimated: >60 mL/min

## 2024-02-04 LAB — I-STAT CG4 LACTIC ACID, ED: Lactic Acid, Venous: 0.7 mmol/L (ref 0.5–1.9)

## 2024-02-04 MED ORDER — VANCOMYCIN HCL IN DEXTROSE 1-5 GM/200ML-% IV SOLN: 1000.0000 mg | Freq: Once | INTRAVENOUS | Status: DC

## 2024-02-04 MED ORDER — VANCOMYCIN HCL IN DEXTROSE 1-5 GM/200ML-% IV SOLN
1000.0000 mg | Freq: Two times a day (BID) | INTRAVENOUS | Status: DC
  Administered 2024-02-05: 1000 mg via INTRAVENOUS
  Filled 2024-02-04: qty 200

## 2024-02-04 MED ORDER — GLIPIZIDE 5 MG PO TABS: 5.0000 mg | ORAL_TABLET | Freq: Two times a day (BID) | ORAL | Status: DC

## 2024-02-04 MED ORDER — METRONIDAZOLE 500 MG/100ML IV SOLN
500.0000 mg | Freq: Once | INTRAVENOUS | Status: AC
  Administered 2024-02-04: 500 mg via INTRAVENOUS
  Filled 2024-02-04: qty 100

## 2024-02-04 MED ORDER — MORPHINE SULFATE (PF) 4 MG/ML IV SOLN
4.0000 mg | Freq: Once | INTRAVENOUS | Status: AC
  Administered 2024-02-04: 4 mg via INTRAVENOUS
  Filled 2024-02-04: qty 1

## 2024-02-04 MED ORDER — ONDANSETRON HCL 4 MG/2ML IJ SOLN
4.0000 mg | Freq: Once | INTRAMUSCULAR | Status: AC
  Administered 2024-02-04: 4 mg via INTRAVENOUS
  Filled 2024-02-04: qty 2

## 2024-02-04 MED ORDER — ATORVASTATIN CALCIUM 80 MG PO TABS
80.0000 mg | ORAL_TABLET | Freq: Every day | ORAL | Status: DC
  Administered 2024-02-04 – 2024-02-06 (×3): 80 mg via ORAL
  Filled 2024-02-04 (×3): qty 1

## 2024-02-04 MED ORDER — ACETAMINOPHEN 500 MG PO TABS
1000.0000 mg | ORAL_TABLET | Freq: Three times a day (TID) | ORAL | Status: DC
  Administered 2024-02-04 – 2024-02-06 (×6): 1000 mg via ORAL
  Filled 2024-02-04 (×6): qty 2

## 2024-02-04 MED ORDER — ENOXAPARIN SODIUM 40 MG/0.4ML IJ SOSY
40.0000 mg | PREFILLED_SYRINGE | INTRAMUSCULAR | Status: DC
  Administered 2024-02-04: 40 mg via SUBCUTANEOUS
  Filled 2024-02-04: qty 0.4

## 2024-02-04 MED ORDER — METOPROLOL SUCCINATE ER 25 MG PO TB24
12.5000 mg | ORAL_TABLET | Freq: Every day | ORAL | Status: DC
  Administered 2024-02-05 – 2024-02-06 (×2): 12.5 mg via ORAL
  Filled 2024-02-04 (×2): qty 1

## 2024-02-04 MED ORDER — OXYCODONE HCL 5 MG PO TABS
5.0000 mg | ORAL_TABLET | ORAL | Status: DC | PRN
  Filled 2024-02-04: qty 1

## 2024-02-04 MED ORDER — VANCOMYCIN HCL 1500 MG/300ML IV SOLN
1500.0000 mg | Freq: Once | INTRAVENOUS | Status: DC
  Filled 2024-02-04: qty 300

## 2024-02-04 MED ORDER — SODIUM CHLORIDE 0.9 % IV SOLN
2.0000 g | Freq: Once | INTRAVENOUS | Status: AC
  Administered 2024-02-04: 2 g via INTRAVENOUS
  Filled 2024-02-04: qty 20

## 2024-02-04 MED ORDER — OXYCODONE HCL 5 MG PO TABS: 5.0000 mg | ORAL_TABLET | ORAL | Status: DC | PRN

## 2024-02-04 MED ORDER — FLUTICASONE FUROATE-VILANTEROL 100-25 MCG/ACT IN AEPB
1.0000 | INHALATION_SPRAY | Freq: Every day | RESPIRATORY_TRACT | Status: DC
  Administered 2024-02-05 – 2024-02-06 (×2): 1 via RESPIRATORY_TRACT
  Filled 2024-02-04: qty 28

## 2024-02-04 MED ORDER — INSULIN GLARGINE-YFGN 100 UNIT/ML ~~LOC~~ SOLN
15.0000 [IU] | Freq: Every day | SUBCUTANEOUS | Status: DC
  Administered 2024-02-04 – 2024-02-05 (×2): 15 [IU] via SUBCUTANEOUS
  Filled 2024-02-04 (×5): qty 0.15

## 2024-02-04 MED ORDER — METRONIDAZOLE 500 MG/100ML IV SOLN: 500.0000 mg | Freq: Once | INTRAVENOUS | Status: DC

## 2024-02-04 MED ORDER — SODIUM CHLORIDE 0.9 % IV SOLN
2.0000 g | Freq: Three times a day (TID) | INTRAVENOUS | Status: DC
  Administered 2024-02-04 – 2024-02-05 (×3): 2 g via INTRAVENOUS
  Filled 2024-02-04 (×4): qty 12.5

## 2024-02-04 MED ORDER — INSULIN ASPART 100 UNIT/ML IJ SOLN
0.0000 [IU] | Freq: Three times a day (TID) | INTRAMUSCULAR | Status: DC
  Administered 2024-02-05: 2 [IU] via SUBCUTANEOUS
  Administered 2024-02-06: 3 [IU] via SUBCUTANEOUS
  Filled 2024-02-04: qty 2
  Filled 2024-02-04: qty 3

## 2024-02-04 MED ORDER — OXYCODONE HCL 5 MG PO TABS
10.0000 mg | ORAL_TABLET | ORAL | Status: DC | PRN
  Administered 2024-02-04 – 2024-02-06 (×6): 10 mg via ORAL
  Filled 2024-02-04 (×6): qty 2

## 2024-02-04 MED ORDER — IPRATROPIUM-ALBUTEROL 0.5-2.5 (3) MG/3ML IN SOLN: 3.0000 mL | Freq: Four times a day (QID) | RESPIRATORY_TRACT | Status: DC | PRN

## 2024-02-04 NOTE — ED Notes (Signed)
"   No brain for vitals machine in room at this time. "

## 2024-02-04 NOTE — H&P (Signed)
 " History and Physical    Patient: [name] FMW:[id number] DOB: 02/19/1950 DOA: 02/04/2024 DOS: the patient was seen and examined on 02/04/2024 PCP: [name], MD  Patient coming from: Home  Chief Complaint:  Chief Complaint  Patient presents with   Foot Pain   HPI: [name] is a 74 y.o. male with medical history significant of CAD, PAD (s/p L fem-pop bypass in 2007 and R iliac artery stent in 2013), HTN, HLD, DM2 COPD, and RA who p/w R foot pain c/f infection.  The patient presented with severe pain in theRLE third toe, which has persisted for several days and prevented sleep for three days. The patient saw a vascular specialist in early January due to toe pain related to PAD, underwent RLE stenting and initially had  decreased pain; unfortunate, this relief was short lived and the pt pain worsened and became accompanied by diffuse swelling of RLE digits. He was evaluated by his vascular surgeon who recommended oral abx to no avail; despite taking the medication as prescribed his pain worsened, and pt was advised to report to ED for Podiatry evaluation.  In the ED, pt AFVSS. Labs unrremarkable. MRI R foot w/o evidence of osteomyelitis or septic joint. EDP consulted podiatry who requested medicine admission in anticipation of surgical amputation.   Review of Systems: As mentioned in the history of present illness. All other systems reviewed and are negative. Past Medical History:  Diagnosis Date   Anemia    Bronchitis    Carotid artery disease    Cervical spine disease    Neck surgery in 2007 with Dr. Leeann   Collagen vascular disease    COPD (chronic obstructive pulmonary disease) (HCC)    Coronary atherosclerosis of native coronary artery    Nonobstrtuctive by catheterization 2007, 2013, 2021   DVT (deep venous thrombosis) (HCC)    Essential hypertension    GERD (gastroesophageal reflux disease)    Hyperlipidemia    PAD (peripheral artery disease)    s/p left  fem-pop bypass 2007; PTA and stenting of right iliac artery 09/2011   Rheumatoid arthritis(714.0)    Type 2 diabetes mellitus (HCC)    Past Surgical History:  Procedure Laterality Date   ABDOMINAL AORTAGRAM N/A 10/03/2011   Procedure: ABDOMINAL EZELLA;  Surgeon: Redell LITTIE Door, MD;  Location: [facility] CATH LAB;  Service: Cardiovascular;  Laterality: N/A;   ABDOMINAL AORTOGRAM W/LOWER EXTREMITY N/A 01/16/2017   Procedure: ABDOMINAL AORTOGRAM W/LOWER EXTREMITY;  Surgeon: Door Redell LITTIE, MD;  Location: [facility] INVASIVE CV LAB;  Service: Cardiovascular;  Laterality: N/A;   ABDOMINAL AORTOGRAM W/LOWER EXTREMITY Bilateral 08/04/2019   Procedure: ABDOMINAL AORTOGRAM W/LOWER EXTREMITY;  Surgeon: Gretta Lonni PARAS, MD;  Location: [facility] INVASIVE CV LAB;  Service: Cardiovascular;  Laterality: Bilateral;   ABDOMINAL AORTOGRAM W/LOWER EXTREMITY N/A 03/09/2020   Procedure: ABDOMINAL AORTOGRAM W/LOWER EXTREMITY;  Surgeon: Gretta Lonni PARAS, MD;  Location: [facility] INVASIVE CV LAB;  Service: Cardiovascular;  Laterality: N/A;   ABDOMINAL AORTOGRAM W/LOWER EXTREMITY N/A 12/02/2022   Procedure: ABDOMINAL AORTOGRAM W/LOWER EXTREMITY;  Surgeon: Pearline Norman RAMAN, MD;  Location: [facility] INVASIVE CV LAB;  Service: Cardiovascular;  Laterality: N/A;   ABDOMINAL AORTOGRAM W/LOWER EXTREMITY N/A 01/15/2024   Procedure: ABDOMINAL AORTOGRAM W/LOWER EXTREMITY;  Surgeon: Gretta Lonni PARAS, MD;  Location: [facility] INVASIVE CV LAB;  Service: Cardiovascular;  Laterality: N/A;   BIOPSY N/A 12/28/2021   Procedure: BIOPSY;  Surgeon: Cindie Carlin POUR, DO;  Location: [facility] ENDO SUITE;  Service: Endoscopy;  Laterality: N/A;  CATARACT EXTRACTION W/PHACO Right 04/18/2023   Procedure: PHACOEMULSIFICATION, CATARACT, WITH IOL INSERTION;  Surgeon: Juli Blunt, MD;  Location: [facility] ORS;  Service: Ophthalmology;  Laterality: Right;  CDE: 1.81   CATARACT EXTRACTION W/PHACO Left 05/02/2023   Procedure: PHACOEMULSIFICATION, CATARACT, WITH IOL INSERTION;  Surgeon:  Juli Blunt, MD;  Location: [facility] ORS;  Service: Ophthalmology;  Laterality: Left;  CDE: 1.17   CERVICAL DISC SURGERY     COLONOSCOPY  2013   Hyperplastic polyp   COLONOSCOPY WITH PROPOFOL  N/A 02/17/2017   Surgeon: Shaaron Lamar HERO, MD; Many small and large mouth diverticula in the sigmoid, descending, transverse colon, internal hemorrhoids that were medium sized and grade 1.   COLONOSCOPY WITH PROPOFOL  N/A 12/28/2021   Surgeon: Cindie Carlin POUR, DO;  Diverticulosis in the sigmoid colon and descending colon.  Likely benign tumor in the ascending colon.   ESOPHAGOGASTRODUODENOSCOPY (EGD) WITH PROPOFOL  N/A 12/28/2021   Procedure: ESOPHAGOGASTRODUODENOSCOPY (EGD) WITH PROPOFOL ;  Surgeon: Cindie Carlin POUR, DO;  Location: [facility];  Service: Endoscopy;  Laterality: N/A;   FEMORAL-POPLITEAL BYPASS GRAFT  11/06/2005   ILIAC ARTERY STENT  10/03/2011   Right CIA stenting   INSERTION, STENT, DRUG-ELUTING, LACRIMAL CANALICULUS Right 04/18/2023   Procedure: INSERTION, STENT, DRUG-ELUTING, LACRIMAL CANALICULUS;  Surgeon: Juli Blunt, MD;  Location: [facility] ORS;  Service: Ophthalmology;  Laterality: Right;   INSERTION, STENT, DRUG-ELUTING, LACRIMAL CANALICULUS Left 05/02/2023   Procedure: INSERTION, STENT, DRUG-ELUTING, LACRIMAL CANALICULUS;  Surgeon: Juli Blunt, MD;  Location: [facility] ORS;  Service: Ophthalmology;  Laterality: Left;   LEFT HEART CATH AND CORONARY ANGIOGRAPHY N/A 04/14/2019   Procedure: LEFT HEART CATH AND CORONARY ANGIOGRAPHY;  Surgeon: Burnard Debby LABOR, MD;  Location: [facility] INVASIVE CV LAB;  Service: Cardiovascular;  Laterality: N/A;   MOUTH SURGERY Right 09/16/2023   had to cut into the jaw   PERIPHERAL VASCULAR BALLOON ANGIOPLASTY Right 01/16/2017   Procedure: PERIPHERAL VASCULAR BALLOON ANGIOPLASTY;  Surgeon: Laurence Redell CROME, MD;  Location: [facility] INVASIVE CV LAB;  Service: Cardiovascular;  Laterality: Right;  common iliac    PERIPHERAL VASCULAR BALLOON ANGIOPLASTY  03/09/2020    Procedure: PERIPHERAL VASCULAR BALLOON ANGIOPLASTY;  Surgeon: Gretta Lonni PARAS, MD;  Location: [facility] INVASIVE CV LAB;  Service: Cardiovascular;;   PR VEIN BYPASS GRAFT,AORTO-FEM-POP  06/22/2010   Redo Left Fem-Pop   Repair of left arm fracture     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 His smoking use included cigarettes and cigars. He started smoking about 57 years ago. He has a 67.5 pack-year smoking history. He has never been exposed to tobacco smoke. He has never used smokeless tobacco. He reports that he does not currently use alcohol. He reports that he does not use drugs.  Allergies[1]  Family History  Problem Relation Age of Onset   Heart attack Mother 55   Deep vein thrombosis Mother    Diabetes Mother    Hyperlipidemia Mother    Hypertension Mother    Peripheral vascular disease Mother    Stroke Father    Diabetes Father    Hyperlipidemia Father    Hypertension Father    Peripheral vascular disease Father    Diabetes Sister    Hyperlipidemia Sister    Hypertension Sister    Diabetes Brother    Hyperlipidemia Brother    Cancer Brother    Deep vein thrombosis Brother    Hypertension Brother  Peripheral vascular disease Brother    Diabetes Daughter    Hyperlipidemia Son    Cancer Son    Hypertension Son    Colon cancer Neg Hx     Prior to Admission medications  Medication Sig Start Date End Date Taking? Authorizing Provider  albuterol  (PROAIR  HFA) 108 (90 Base) MCG/ACT inhaler Inhale 2 puffs into the lungs every 6 (six) hours as needed for wheezing or shortness of breath. 08/22/22   Cobb, Comer GAILS, NP  aspirin  EC 81 MG tablet Take 1 tablet (81 mg total) by mouth daily. Swallow whole. 01/15/24 01/14/25  Gretta Lonni PARAS, MD  atorvastatin  (LIPITOR ) 80 MG tablet TAKE 1 TABLET(80 MG) BY MOUTH DAILY 06/17/23   Miriam Norris, NP  ciprofloxacin  (CIPRO ) 500 MG tablet Take 1  tablet (500 mg total) by mouth 2 (two) times daily for 10 days. 01/26/24 02/05/24  Schuh, McKenzi P, PA-C  clopidogrel  (PLAVIX ) 75 MG tablet Take 1 tablet (75 mg total) by mouth daily. 04/17/16   Laurence Redell CROME, MD  dexlansoprazole  (DEXILANT ) 60 MG capsule Take 1 capsule (60 mg total) by mouth daily. 03/14/22 01/27/24  Cindie Carlin POUR, DO  diclofenac  Sodium (VOLTAREN ) 1 % GEL Apply 2 gram  to painful foot and ankle joint twice daily. 10/17/21   Gaynel Delon CROME, DPM  doxycycline  (VIBRAMYCIN ) 100 MG capsule Take 1 capsule (100 mg total) by mouth 2 (two) times daily for 14 days. 01/26/24   Schuh, McKenzi P, PA-C  empagliflozin (JARDIANCE) 25 MG TABS tablet Take 25 mg by mouth in the morning.    [provider]  glipiZIDE  (GLUCOTROL ) 5 MG tablet Take 5-10 mg by mouth See admin instructions. Take 10 mg by mouth in the morning and 5 mg in the evening 02/19/21   [provider]  HYDROcodone-acetaminophen  (NORCO) 7.5-325 MG per tablet Take 1 tablet by mouth 2 (two) times daily as needed for moderate pain (pain score 4-6). 06/24/13   [provider]  hydrocortisone  2.5 % lotion Apply topically daily. Apply to affected area of face daily x 1 month then decrease to once a day Monday, Wednesday, Friday 07/28/23   Sandridge, Brenda K, PA-C  hydroxychloroquine  (PLAQUENIL ) 200 MG tablet Take one tablet twice daily Mondays through Fridays only. DO NOT TAKE SATURDAY OR SUNDAY. 09/02/23   Cheryl Waddell HERO, PA-C  JANUVIA 100 MG tablet Take 100 mg by mouth in the morning. 07/26/13   [provider]  ketoconazole  (NIZORAL ) 2 % cream Apply to both feet and between toes once daily for 6 weeks. Patient not taking: Reported on 01/26/2024 06/11/23   Gaynel Delon CROME, DPM  ketorolac  (ACULAR ) 0.5 % ophthalmic solution Place 1 drop into the right eye 4 (four) times daily. Patient not taking: Reported on 01/26/2024 04/14/23   [provider]  LANTUS  SOLOSTAR 100 UNIT/ML Solostar Pen Inject 20 Units  into the skin at bedtime. 07/25/22   [provider]  metoprolol  succinate (TOPROL -XL) 25 MG 24 hr tablet Take 0.5 tablets (12.5 mg total) by mouth daily. 10/17/23   Debera Jayson MATSU, MD  nitroGLYCERIN  (NITROSTAT ) 0.4 MG SL tablet Place 1 tablet (0.4 mg total) under the tongue every 5 (five) minutes x 3 doses as needed for chest pain (If no relief after 3rd dose, GO TO ER.). 03/04/22   Miriam Norris, NP  ONE TOUCH ULTRA TEST test strip  08/13/13   [provider]  OneTouch Delica Lancets 33G MISC Apply topically. 06/11/19   [provider]  Plecanatide  (  TRULANCE ) 3 MG TABS Take 1 tablet (3 mg total) by mouth daily. Patient taking differently: Take 3 mg by mouth daily as needed (constipation). 11/07/22   Rudy Josette RAMAN, PA-C  sildenafil (REVATIO) 20 MG tablet Take 40-60 mg by mouth daily as needed for erectile dysfunction. 03/01/20   [provider]  SYMBICORT  160-4.5 MCG/ACT inhaler INHALE 2 PUFFS FIRST THING IN THE MORNING AND THEN 2 PUFFS 12 HOURS LATER 12/24/22   Darlean Ozell NOVAK, MD  Tiotropium Bromide  (SPIRIVA  RESPIMAT) 2.5 MCG/ACT AERS INHALE 2 PUFFS INTO THE LUNGS DAILY 11/26/23   Darlean Ozell NOVAK, MD  XELJANZ  5 MG TABS Take 1 tablet (5 mg total) by mouth daily. 12/19/23   Cheryl Waddell HERO, PA-C    Physical Exam: Vitals:   02/04/24 0936 02/04/24 0959  BP: 129/76   Pulse: 72   Resp: 15   Temp: 98.4 F (36.9 C)   TempSrc: Oral   SpO2: 94%   Weight:  74.8 kg  Height:  5' 9 (1.753 m)   General: Alert, oriented x3, resting comfortably in no acute distress Respiratory: Lungs clear to auscultation bilaterally with normal respiratory effort; no w/r/r Cardiovascular: Regular rate and rhythm w/o m/r/g Abdomen: Soft, nontender, nondistended. Positive bowel sounds MSK: RLE toes with diffuse edema and skin sloughing; Dorsalis pedis and posterior tibial pulse undetectable on palpation   Data Reviewed:  Lab Results  Component Value Date   WBC 6.3  02/04/2024   HGB 12.3 (L) 02/04/2024   HCT 40.3 02/04/2024   MCV 87.2 02/04/2024   PLT 367 02/04/2024   Lab Results  Component Value Date   GLUCOSE 73 02/04/2024   CALCIUM  9.4 02/04/2024   NA 138 02/04/2024   K 4.5 02/04/2024   CO2 24 02/04/2024   CL 103 02/04/2024   BUN 13 02/04/2024   CREATININE 0.86 02/04/2024   Lab Results  Component Value Date   ALT 9 02/04/2024   AST 22 02/04/2024   ALKPHOS 74 02/04/2024   BILITOT 0.3 02/04/2024   Lab Results  Component Value Date   INR 1.1 (H) 07/24/2011   INR 1.00 06/20/2010   Radiology: MR FOOT RIGHT WO CONTRAST Result Date: 02/04/2024 CLINICAL DATA:  Osteomyelitis, foot EXAM: MRI OF THE RIGHT FOREFOOT WITHOUT CONTRAST TECHNIQUE: Multiplanar, multisequence MR imaging of the right forefoot was performed. No intravenous contrast was administered. COMPARISON:  Radiographs 02/04/2024 and 05/05/2020. 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 No evidence of acute fracture, dislocation or osteomyelitis. No significant joint effusions. Ligaments Intact Lisfranc ligament. The collateral ligaments of the metatarsophalangeal joints are intact. Muscles and Tendons No evidence of tendon tear or significant tenosynovitis. Mild T2 hyperintensity within the flexor musculature. No significant muscular atrophy. Soft tissues Generalized forefoot subcutaneous edema, greatest dorsally. Edema extends into the great toe. No organized fluid collection, foreign body or obvious soft tissue ulceration. Prominent vascular calcifications, better seen on radiographs. IMPRESSION: 1. No evidence of osteomyelitis or septic joint. 2. Nonspecific generalized forefoot subcutaneous edema, greatest dorsally. No organized fluid collection, foreign body or obvious soft tissue ulceration. 3. 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 Electronically Signed   By: Elsie Perone M.D.   On: 02/04/2024 13:46   DG Foot Complete Right Result Date: 02/04/2024 CLINICAL DATA:  Right foot pain/wound.  Evaluate for osteomyelitis. EXAM: RIGHT FOOT COMPLETE - 3 VIEW COMPARISON:  Prior  radiographs of the right foot 05/05/2020 FINDINGS: Degenerative osteoarthritis of the great toe MTP joint is slightly progressed compared to prior. Remote healed fracture of the fifth metatarsal. No evidence of focal erosion or decreased mineralization to suggest osteomyelitis. Extensive atherosclerotic vascular calcifications throughout the small vessels. IMPRESSION: 1. No conventional radiographic evidence of osteomyelitis. 2. Small vessel peripheral arterial disease. 3. 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 Electronically Signed   By: Wilkie Lent M.D.   On: 02/04/2024 11:50    Assessment and Plan: 64M h/o CAD, PAD (s/p L fem-pop bypass in 2007 and R iliac artery stent in 2013), HTN, HLD, DM2 COPD, and RA who p/w R foot infection.  R foot infection R foot pain Possible dry gangrene -Podiatry consulted; apprec eval/recs -IV vancomycin  and IV cefepime  for now -PO tylenol  1g QID sch + PO oxycodone  5-10mg  q4h prn  COPD -PTA inhalers (Breo Ellipta  and Duonebs)  CAD PAD -PTA metoprolol  and atorvastatin   DM2 -PTA Semglee  15U nightly and SSI TID AC prn   Advance Care Planning:   Code Status: Prior   Consults: Podiatry  Family Communication: Daughter  Severity of Illness: The appropriate patient status for this patient is INPATIENT. Inpatient status is judged to be reasonable and necessary in order to provide the required intensity of service to ensure the patient's safety. The patient's presenting symptoms, physical exam findings, and initial radiographic and laboratory data in the context of their chronic comorbidities is felt to place them at  high risk for further clinical deterioration. Furthermore, it is not anticipated that the patient will be medically stable for discharge from the hospital within 2 midnights of admission.   * I certify that at the point of admission it is my clinical judgment that the patient will require inpatient hospital care spanning beyond 2 midnights from the point of admission due to high intensity of service, high risk for further deterioration and high frequency of surveillance required.*   ------- I spent 57 minutes reviewing previous notes, at the bedside counseling/discussing the treatment plan, and performing clinical documentation.  Author: Marsha Ada, MD 02/04/2024 2:33 PM  For on call review [email or web address].      [1]  Allergies Allergen Reactions   Penicillins Hives   "

## 2024-02-04 NOTE — Progress Notes (Signed)
 Pharmacy Antibiotic Note  [name] is a 74 y.o. male admitted on 02/04/2024 presenting with foot pain and concern for infection.  Pharmacy has been consulted for vancomycin  dosing.  Vancomycin  1500 mg IV x 1 given in ED  Plan: Vancomycin  1g IV q 12h (eAUC 547) Monitor renal function, Cx and intervention plans to narrow Vancomycin  levels as needed  Height: 5' 9 (175.3 cm) Weight: 74.8 kg (165 lb) IBW/kg (Calculated) : 70.7  Temp (24hrs), Avg:98.4 F (36.9 C), Min:98.4 F (36.9 C), Max:98.4 F (36.9 C)  Recent Labs  Lab 02/04/24 1051 02/04/24 1110 02/04/24 1215  WBC  --   --  6.3  CREATININE 0.86  --   --   LATICACIDVEN  --  0.7  --     Estimated Creatinine Clearance: 76.5 mL/min (by C-G formula based on SCr of 0.86 mg/dL).    Allergies[1]  Dorn Poot, PharmD, [facility] Clinical Pharmacist ED Pharmacist Phone # [phone] 02/04/2024 3:06 PM      [1]  Allergies Allergen Reactions   Penicillins Hives

## 2024-02-04 NOTE — ED Triage Notes (Signed)
 Pt here for further eval of right foot pain/ wound. Pt has hx of vascular disease with a 4th toe amputation. Pt seen by vascular who referred him to podiatry for possible infection. Pt sent here by podiatry for abx and amputation of half his foot. Pt has drainage and black discoloration to toes. Pt c.o severe pain.

## 2024-02-04 NOTE — ED Notes (Addendum)
 Pt transferred off floor, unable to move off floor in system as chart is in use by  NT.

## 2024-02-04 NOTE — Progress Notes (Signed)
" ° ° °  PROCEDURAL EXPEDITER PROGRESS NOTE  Patient Name: [name]  DOB:10/18/1950 Date of Admission: 02/04/2024  Date of Assessment:02/04/24   -------------------------------------------------------------------------------------------------------------------   Brief clinical summary: Patient going for right foot transmetatarsal amputation surgery on 02-05-24.  Orders in place:   Yes   Communication with surgical team if no orders: n/a  Labs, test, and orders reviewed: yes  Requires surgical clearance:   No  What type of clearance: n/a  Clearance received: n/a  Barriers noted: none  Intervention provided by [facility] team: placed PCR swab for history of MRSA  Barrier resolved:   not applicable   -------------------------------------------------------------------------------------------------------------------  [name], [name] [name] Please contact us  directly via secure chat (search for [facility]) or by calling us  at [phone] [facility]).  "

## 2024-02-04 NOTE — Consult Note (Signed)
 "  PODIATRY CONSULTATION  NAME [name] MRN [id number] DOB July 10, 1950 DOA 02/04/2024   Reason for consult:  Chief Complaint  Patient presents with   Foot Pain    Attending/Consulting physician: CHARM Quale DO  History of present illness: 74 yo M with hx PVD with a complaints of concern for worsening necrosis to his right foot. Patient had been seen by vascular surgery recently and had a recent right tibial angioplasty. Had developed necrosis to one of the toes on his right foot. Had seen podiatry earlier this week. He feels like the areas got much more swollen and painful and spread up the foot.   Discussed with pt and he says he's having a lot of pain in the right forefoot. Discussed MRI and clinical findings. He is hopeful that transmetatarsal amputation which he was expecting to have to get will help with his pain in the foot. Dicussed surgical timing.   Past Medical History:  Diagnosis Date   Anemia    Bronchitis    Carotid artery disease    Cervical spine disease    Neck surgery in 2007 with Dr. Leeann   Collagen vascular disease    COPD (chronic obstructive pulmonary disease) (HCC)    Coronary atherosclerosis of native coronary artery    Nonobstrtuctive by catheterization 2007, 2013, 2021   DVT (deep venous thrombosis) (HCC)    Essential hypertension    GERD (gastroesophageal reflux disease)    Hyperlipidemia    PAD (peripheral artery disease)    s/p left fem-pop bypass 2007; PTA and stenting of right iliac artery 09/2011   Rheumatoid arthritis(714.0)    Type 2 diabetes mellitus (HCC)        Latest Ref Rng & Units 02/04/2024   12:15 PM 01/15/2024    7:27 AM 10/20/2023    9:04 AM  CBC  WBC 4.0 - 10.5 K/uL 6.3   9.2   Hemoglobin 13.0 - 17.0 g/dL 87.6  84.3  86.3   Hematocrit 39.0 - 52.0 % 40.3  46.0  42.7   Platelets 150 - 400 K/uL 367   275        Latest Ref Rng & Units 02/04/2024   10:51 AM 01/15/2024    7:27 AM 10/20/2023    9:04 AM  BMP  Glucose 70 - 99  mg/dL 73  780  748   BUN 8 - 23 mg/dL 13  18  11    Creatinine 0.61 - 1.24 mg/dL 9.13  8.89  9.10   BUN/Creat Ratio 6 - 22 (calc)   SEE NOTE:   Sodium 135 - 145 mmol/L 138  140  139   Potassium 3.5 - 5.1 mmol/L 4.5  5.4  4.0   Chloride 98 - 111 mmol/L 103  103  102   CO2 22 - 32 mmol/L 24   27   Calcium  8.9 - 10.3 mg/dL 9.4   9.0       Physical Exam: Lower Extremity Exam  Right foot with dusky discoloration of the distal forefoot, with pain to palpation  Gangrene worse of 2nd and 3rd toes  Palpable DP pulse, weakly palpable PT         ASSESSMENT/PLAN OF CARE 74 y.o. 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   - NPO p MN for OR tomorrow for right foot transmetatarsal amputation. Discussed with patient and he agrees to proceed. Consent ordered.  - ok to hold abx until pre op  - Anticoagulation:  please hold until after surgery tmrw - Wound care: Betadine  paint to right foot - WB status: NWB pre and post op - Will continue to follow   Thank you for the consult.  Please contact me directly with any questions or concerns.           Marolyn JULIANNA Honour, DPM [facility]    [address] [place], [place] [address]                Office [phone]  Fax [phone]     "

## 2024-02-04 NOTE — ED Notes (Signed)
 Redrawn labs sent

## 2024-02-04 NOTE — ED Notes (Signed)
 Ed Pharmacist contacted to ask to retime earlier late meds from before pts arrival, cannot draw these meds from [facility] at this time.

## 2024-02-04 NOTE — ED Provider Notes (Signed)
 " [place] [facility] AT [facility] Provider Note   CSN: [id number] Arrival date & time: 02/04/24  9086     Patient presents with: Foot Pain   [name] is a 74 y.o. male.   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  Patient had been seen by vascular surgery recently and had a recent right tibial angioplasty.  Had developed necrosis to one of the toes on his right foot.  Had seen podiatry earlier this week.  He feels like the areas got much more swollen and painful and spread up the foot.  No fevers.  No trauma.   Foot Pain       Prior to Admission medications  Medication Sig Start Date End Date Taking? Authorizing Provider  albuterol  (PROAIR  HFA) 108 (90 Base) MCG/ACT inhaler Inhale 2 puffs into the lungs every 6 (six) hours as needed for wheezing or shortness of breath. 08/22/22   Cobb, Comer GAILS, NP  aspirin  EC 81 MG tablet Take 1 tablet (81 mg total) by mouth daily. Swallow whole. 01/15/24 01/14/25  Gretta Lonni PARAS, MD  atorvastatin  (LIPITOR ) 80 MG tablet TAKE 1 TABLET(80 MG) BY MOUTH DAILY 06/17/23   Miriam Norris, NP  ciprofloxacin  (CIPRO ) 500 MG tablet Take 1 tablet (500 mg total) by mouth 2 (two) times daily for 10 days. 01/26/24 02/05/24  Schuh, McKenzi P, PA-C  clopidogrel  (PLAVIX ) 75 MG tablet Take 1 tablet (75 mg total) by mouth daily. 04/17/16   Laurence Redell CROME, MD  dexlansoprazole  (DEXILANT ) 60 MG capsule Take 1 capsule (60 mg total) by mouth daily. 03/14/22 01/27/24  Cindie Carlin POUR, DO  diclofenac  Sodium (VOLTAREN ) 1 % GEL Apply 2 gram  to painful foot and ankle joint twice daily. 10/17/21   Gaynel Delon CROME, DPM  doxycycline  (VIBRAMYCIN ) 100 MG capsule Take 1 capsule (100 mg total) by mouth 2 (two) times daily for 14 days. 01/26/24   Schuh, McKenzi P, PA-C  empagliflozin (JARDIANCE) 25 MG TABS tablet Take 25 mg by mouth in the morning.    [provider]  glipiZIDE  (GLUCOTROL ) 5 MG tablet Take  5-10 mg by mouth See admin instructions. Take 10 mg by mouth in the morning and 5 mg in the evening 02/19/21   [provider]  HYDROcodone-acetaminophen  (NORCO) 7.5-325 MG per tablet Take 1 tablet by mouth 2 (two) times daily as needed for moderate pain (pain score 4-6). 06/24/13   [provider]  hydrocortisone  2.5 % lotion Apply topically daily. Apply to affected area of face daily x 1 month then decrease to once a day Monday, Wednesday, Friday 07/28/23   Sandridge, Brenda K, PA-C  hydroxychloroquine  (PLAQUENIL ) 200 MG tablet Take one tablet twice daily Mondays through Fridays only. DO NOT TAKE SATURDAY OR SUNDAY. 09/02/23   Cheryl Waddell HERO, PA-C  JANUVIA 100 MG tablet Take 100 mg by mouth in the morning. 07/26/13   [provider]  ketoconazole  (NIZORAL ) 2 % cream Apply to both feet and between toes once daily for 6 weeks. Patient not taking: Reported on 01/26/2024 06/11/23   Gaynel Delon CROME, DPM  ketorolac  (ACULAR ) 0.5 % ophthalmic solution Place 1 drop into the right eye 4 (four) times daily. Patient not taking: Reported on 01/26/2024 04/14/23   [provider]  LANTUS  SOLOSTAR 100 UNIT/ML Solostar Pen Inject 20 Units into the skin at bedtime. 07/25/22   [provider]  metoprolol  succinate (TOPROL -XL) 25 MG 24 hr  tablet Take 0.5 tablets (12.5 mg total) by mouth daily. 10/17/23   Debera Jayson MATSU, MD  nitroGLYCERIN  (NITROSTAT ) 0.4 MG SL tablet Place 1 tablet (0.4 mg total) under the tongue every 5 (five) minutes x 3 doses as needed for chest pain (If no relief after 3rd dose, GO TO ER.). 03/04/22   Miriam Norris, NP  ONE TOUCH ULTRA TEST test strip  08/13/13   [provider]  OneTouch Delica Lancets 33G MISC Apply topically. 06/11/19   [provider]  Plecanatide  (TRULANCE ) 3 MG TABS Take 1 tablet (3 mg total) by mouth daily. Patient taking differently: Take 3 mg by mouth daily as needed (constipation). 11/07/22   Rudy Josette RAMAN, PA-C   sildenafil (REVATIO) 20 MG tablet Take 40-60 mg by mouth daily as needed for erectile dysfunction. 03/01/20   [provider]  SYMBICORT  160-4.5 MCG/ACT inhaler INHALE 2 PUFFS FIRST THING IN THE MORNING AND THEN 2 PUFFS 12 HOURS LATER 12/24/22   Darlean Ozell NOVAK, MD  Tiotropium Bromide  (SPIRIVA  RESPIMAT) 2.5 MCG/ACT AERS INHALE 2 PUFFS INTO THE LUNGS DAILY 11/26/23   Darlean Ozell NOVAK, MD  XELJANZ  5 MG TABS Take 1 tablet (5 mg total) by mouth daily. 12/19/23   Cheryl Waddell HERO, PA-C    Allergies: Penicillins    Review of Systems  Updated Vital Signs BP 129/76 (BP Location: Right Arm)   Pulse 72   Temp 98.4 F (36.9 C) (Oral)   Resp 15   Ht 5' 9 (1.753 m)   Wt 74.8 kg   SpO2 94%   BMI 24.37 kg/m   Physical Exam Vitals and nursing note reviewed.  Constitutional:      Appearance: He is well-developed.  HENT:     Head: Normocephalic and atraumatic.  Eyes:     Pupils: Pupils are equal, round, and reactive to light.  Neck:     Vascular: No JVD.  Cardiovascular:     Rate and Rhythm: Normal rate and regular rhythm.     Heart sounds: No murmur heard.    No friction rub. No gallop.  Pulmonary:     Effort: No respiratory distress.     Breath sounds: No wheezing.  Abdominal:     General: There is no distension.     Tenderness: There is no abdominal tenderness. There is no guarding or rebound.  Musculoskeletal:        General: Normal range of motion.     Cervical back: Normal range of motion and neck supple.     Comments: Patient's right third toe grossly necrotic.  No obvious drainage.  Foot is warm and appears well-perfused.  No obvious palpable pulse for me.  Dopplerable dorsalis pedis with good signal.  Some fluctuance to the base of the right first toe.  Skin:    Coloration: Skin is not pale.     Findings: No rash.  Neurological:     Mental Status: He is alert and oriented to person, place, and time.  Psychiatric:        Behavior: Behavior normal.     (all labs  ordered are listed, but only abnormal results are displayed) Labs Reviewed  CBC WITH DIFFERENTIAL/PLATELET - Abnormal; Notable for the following components:      Result Value   Hemoglobin 12.3 (*)    All other components within normal limits  CULTURE, BLOOD (ROUTINE X 2)  CULTURE, BLOOD (ROUTINE X 2)  COMPREHENSIVE METABOLIC PANEL WITH GFR  CBC WITH DIFFERENTIAL/PLATELET  CBC  CREATININE, SERUM  I-STAT CG4 LACTIC ACID, ED    EKG: None  Radiology: MR FOOT RIGHT WO CONTRAST Result Date: 02/04/2024 CLINICAL DATA:  Osteomyelitis, foot EXAM: MRI OF THE RIGHT FOREFOOT WITHOUT CONTRAST TECHNIQUE: Multiplanar, multisequence MR imaging of the right forefoot was performed. No intravenous contrast was administered. COMPARISON:  Radiographs 02/04/2024 and 05/05/2020. 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 No evidence of acute fracture, dislocation or osteomyelitis. No significant joint effusions. Ligaments Intact Lisfranc ligament. The collateral ligaments of the metatarsophalangeal joints are intact. Muscles and Tendons No evidence of tendon tear or significant tenosynovitis. Mild T2 hyperintensity within the flexor musculature. No significant muscular atrophy. Soft tissues Generalized forefoot subcutaneous edema, greatest dorsally. Edema extends into the great toe. No organized fluid collection, foreign body or obvious soft tissue ulceration. Prominent vascular calcifications, better seen on radiographs. IMPRESSION: 1. No evidence of osteomyelitis or septic joint. 2. Nonspecific generalized forefoot subcutaneous edema, greatest dorsally. No organized fluid collection, foreign body or obvious soft tissue ulceration. 3. 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 Electronically Signed   By:  Elsie Perone M.D.   On: 02/04/2024 13:46   DG Foot Complete Right Result Date: 02/04/2024 CLINICAL DATA:  Right foot pain/wound.  Evaluate for osteomyelitis. EXAM: RIGHT FOOT COMPLETE - 3 VIEW COMPARISON:  Prior radiographs of the right foot 05/05/2020 FINDINGS: Degenerative osteoarthritis of the great toe MTP joint is slightly progressed compared to prior. Remote healed fracture of the fifth metatarsal. No evidence of focal erosion or decreased mineralization to suggest osteomyelitis. Extensive atherosclerotic vascular calcifications throughout the small vessels. IMPRESSION: 1. No conventional radiographic evidence of osteomyelitis. 2. Small vessel peripheral arterial disease. 3. 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 Electronically Signed   By: Wilkie Lent M.D.   On: 02/04/2024 11:50     Procedures   Medications Ordered in the ED  cefTRIAXone  (ROCEPHIN ) 2 g in sodium chloride  0.9 % 100 mL IVPB (2 g Intravenous New Bag/Given 02/04/24 1201)    And  metroNIDAZOLE  (FLAGYL ) IVPB 500 mg (has no administration in time range)  vancomycin  (VANCOREADY) IVPB 1500 mg/300 mL (has no administration in time range)  morphine  (PF) 4 MG/ML injection 4 mg (has no administration in time range)  ondansetron  (ZOFRAN ) injection 4 mg (has no administration in time range)  enoxaparin  (LOVENOX ) injection 40 mg (has no administration in time range)  atorvastatin  (LIPITOR ) tablet 80 mg (has no administration in time range)  acetaminophen  (TYLENOL ) tablet 1,000 mg (has no administration in time range)  insulin  glargine-yfgn injection 15 Units (has no administration in time range)  metoprolol  succinate (TOPROL -XL) 24 hr tablet 12.5 mg (has no administration in time range)  fluticasone  furoate-vilanterol (BREO ELLIPTA ) 100-25 MCG/ACT 1 puff (1 puff Inhalation Not Given 02/04/24 1510)  ipratropium-albuterol  (DUONEB) 0.5-2.5 (3) MG/3ML nebulizer solution 3 mL (has no administration  in time range)  oxyCODONE  (Oxy IR/ROXICODONE ) immediate release tablet 5 mg (has no administration in time range)    Or  oxyCODONE  (Oxy IR/ROXICODONE ) immediate release tablet 10 mg (has no administration in time range)  ceFEPIme  (MAXIPIME ) 2 g in sodium chloride  0.9 % 100 mL IVPB (has no administration in time range)  vancomycin  (VANCOCIN ) IVPB 1000 mg/200 mL premix (has no administration in time range)  Medical Decision Making Amount and/or Complexity of Data Reviewed Labs: ordered. Radiology: ordered.  Risk Prescription drug management. Decision regarding hospitalization.   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  The patient recently had angioplasty done by vascular surgery on the eighth of this month.  Dopplerable pulse on my exam.  I discussed case with Dr. Malvin, podiatry will see the patient later today.  Recommending an MRI.  He thinks likely will require transmetatarsal amputation.  The patients results and plan were reviewed and discussed.   Any x-rays performed were independently reviewed by myself.   Differential diagnosis were considered with the presenting HPI.  Medications  cefTRIAXone  (ROCEPHIN ) 2 g in sodium chloride  0.9 % 100 mL IVPB (2 g Intravenous New Bag/Given 02/04/24 1201)    And  metroNIDAZOLE  (FLAGYL ) IVPB 500 mg (has no administration in time range)  vancomycin  (VANCOREADY) IVPB 1500 mg/300 mL (has no administration in time range)  morphine  (PF) 4 MG/ML injection 4 mg (has no administration in time range)  ondansetron  (ZOFRAN ) injection 4 mg (has no administration in time range)  enoxaparin  (LOVENOX ) injection 40 mg (has no administration in time range)  atorvastatin  (LIPITOR ) tablet 80 mg (has no administration in time range)  acetaminophen  (TYLENOL ) tablet 1,000 mg (has no administration in time range)  insulin  glargine-yfgn injection 15 Units (has no administration in time range)   metoprolol  succinate (TOPROL -XL) 24 hr tablet 12.5 mg (has no administration in time range)  fluticasone  furoate-vilanterol (BREO ELLIPTA ) 100-25 MCG/ACT 1 puff (1 puff Inhalation Not Given 02/04/24 1510)  ipratropium-albuterol  (DUONEB) 0.5-2.5 (3) MG/3ML nebulizer solution 3 mL (has no administration in time range)  oxyCODONE  (Oxy IR/ROXICODONE ) immediate release tablet 5 mg (has no administration in time range)    Or  oxyCODONE  (Oxy IR/ROXICODONE ) immediate release tablet 10 mg (has no administration in time range)  ceFEPIme  (MAXIPIME ) 2 g in sodium chloride  0.9 % 100 mL IVPB (has no administration in time range)  vancomycin  (VANCOCIN ) IVPB 1000 mg/200 mL premix (has no administration in time range)    Vitals:   02/04/24 0936 02/04/24 0959  BP: 129/76   Pulse: 72   Resp: 15   Temp: 98.4 F (36.9 C)   TempSrc: Oral   SpO2: 94%   Weight:  74.8 kg  Height:  5' 9 (1.753 m)    Final diagnoses:  Gangrene (HCC)    Admission/ observation were discussed with the admitting physician, patient and/or family and they are comfortable with the plan.       Final diagnoses:  Gangrene [facility])    ED Discharge Orders     None          Emil Share, DO 02/04/24 1600  "

## 2024-02-04 NOTE — ED Notes (Signed)
 Called charge number for 5N reaching secretary who transferred to pts RN on floor. Questions answered for main RN and informed of rolling call.

## 2024-02-05 ENCOUNTER — Inpatient Hospital Stay (HOSPITAL_COMMUNITY)

## 2024-02-05 ENCOUNTER — Inpatient Hospital Stay (HOSPITAL_COMMUNITY): Admitting: Anesthesiology

## 2024-02-05 ENCOUNTER — Encounter (HOSPITAL_COMMUNITY): Payer: Self-pay | Admitting: Hospitalist

## 2024-02-05 ENCOUNTER — Encounter (HOSPITAL_COMMUNITY): Admitting: Anesthesiology

## 2024-02-05 ENCOUNTER — Encounter (HOSPITAL_COMMUNITY)
Admission: EM | Disposition: A | Discharge status: Home / Self Care | Payer: Self-pay | Source: Home / Self Care | Attending: Internal Medicine

## 2024-02-05 DIAGNOSIS — I10 Essential (primary) hypertension: Secondary | ICD-10-CM

## 2024-02-05 DIAGNOSIS — E785 Hyperlipidemia, unspecified: Secondary | ICD-10-CM | POA: Diagnosis not present

## 2024-02-05 DIAGNOSIS — Z87891 Personal history of nicotine dependence: Secondary | ICD-10-CM

## 2024-02-05 DIAGNOSIS — I2510 Atherosclerotic heart disease of native coronary artery without angina pectoris: Secondary | ICD-10-CM

## 2024-02-05 DIAGNOSIS — I96 Gangrene, not elsewhere classified: Secondary | ICD-10-CM | POA: Diagnosis not present

## 2024-02-05 DIAGNOSIS — J449 Chronic obstructive pulmonary disease, unspecified: Secondary | ICD-10-CM | POA: Diagnosis not present

## 2024-02-05 DIAGNOSIS — L089 Local infection of the skin and subcutaneous tissue, unspecified: Secondary | ICD-10-CM

## 2024-02-05 DIAGNOSIS — E119 Type 2 diabetes mellitus without complications: Secondary | ICD-10-CM | POA: Diagnosis not present

## 2024-02-05 DIAGNOSIS — E1152 Type 2 diabetes mellitus with diabetic peripheral angiopathy with gangrene: Secondary | ICD-10-CM

## 2024-02-05 LAB — BASIC METABOLIC PANEL WITH GFR
Anion gap: 7 (ref 5–15)
BUN: 12 mg/dL (ref 8–23)
CO2: 27 mmol/L (ref 22–32)
Calcium: 8.5 mg/dL — ABNORMAL LOW (ref 8.9–10.3)
Chloride: 104 mmol/L (ref 98–111)
Creatinine, Ser: 0.91 mg/dL (ref 0.61–1.24)
GFR, Estimated: >60 mL/min
Glucose, Bld: 115 mg/dL — ABNORMAL HIGH (ref 70–99)
Potassium: 4.2 mmol/L (ref 3.5–5.1)
Sodium: 138 mmol/L (ref 135–145)

## 2024-02-05 LAB — GLUCOSE, CAPILLARY
Glucose-Capillary: 119 mg/dL — ABNORMAL HIGH (ref 70–99)
Glucose-Capillary: 96 mg/dL (ref 70–99)
Glucose-Capillary: 93 mg/dL (ref 70–99)
Glucose-Capillary: 122 mg/dL — ABNORMAL HIGH (ref 70–99)
Glucose-Capillary: 149 mg/dL — ABNORMAL HIGH (ref 70–99)

## 2024-02-05 LAB — CBC
HCT: 37.6 % — ABNORMAL LOW (ref 39.0–52.0)
Hemoglobin: 11.6 g/dL — ABNORMAL LOW (ref 13.0–17.0)
MCH: 26.9 pg (ref 26.0–34.0)
MCHC: 30.9 g/dL (ref 30.0–36.0)
MCV: 87.2 fL (ref 80.0–100.0)
Platelets: 303 10*3/uL (ref 150–400)
RBC: 4.31 MIL/uL (ref 4.22–5.81)
RDW: 15.2 % (ref 11.5–15.5)
WBC: 5.2 10*3/uL (ref 4.0–10.5)
nRBC: 0 % (ref 0.0–0.2)

## 2024-02-05 LAB — SURGICAL PCR SCREEN
MRSA, PCR: NEGATIVE
Staphylococcus aureus: NEGATIVE

## 2024-02-05 LAB — HEMOGLOBIN A1C
Hgb A1c MFr Bld: 8.9 % — ABNORMAL HIGH (ref 4.8–5.6)
Mean Plasma Glucose: 208.73 mg/dL

## 2024-02-05 MED ORDER — VANCOMYCIN HCL 500 MG/100ML IV SOLN
500.0000 mg | Freq: Once | INTRAVENOUS | Status: AC
  Administered 2024-02-05: 500 mg via INTRAVENOUS
  Filled 2024-02-05: qty 100

## 2024-02-05 MED ORDER — VANCOMYCIN HCL 1500 MG/300ML IV SOLN
1500.0000 mg | INTRAVENOUS | Status: DC
  Administered 2024-02-06: 1500 mg via INTRAVENOUS
  Filled 2024-02-05: qty 300

## 2024-02-05 MED ORDER — LACTATED RINGERS IV SOLN: INTRAVENOUS | Status: DC

## 2024-02-05 MED ORDER — LACTATED RINGERS IV SOLN: INTRAVENOUS | Status: DC | PRN

## 2024-02-05 MED ORDER — FENTANYL CITRATE (PF) 100 MCG/2ML IJ SOLN: 25.0000 ug | INTRAMUSCULAR | Status: DC | PRN

## 2024-02-05 MED ORDER — ROPIVACAINE HCL 5 MG/ML IJ SOLN
INTRAMUSCULAR | Status: DC | PRN
  Administered 2024-02-05: 25 mL via PERINEURAL
  Administered 2024-02-05: 15 mL via PERINEURAL

## 2024-02-05 MED ORDER — FENTANYL CITRATE (PF) 100 MCG/2ML IJ SOLN
INTRAMUSCULAR | Status: AC
  Filled 2024-02-05: qty 2

## 2024-02-05 MED ORDER — FENTANYL CITRATE (PF) 100 MCG/2ML IJ SOLN: 50.0000 ug | Freq: Once | INTRAMUSCULAR | Status: AC

## 2024-02-05 MED ORDER — BUPIVACAINE HCL (PF) 0.5 % IJ SOLN
INTRAMUSCULAR | Status: AC
  Filled 2024-02-05: qty 30

## 2024-02-05 MED ORDER — AMISULPRIDE (ANTIEMETIC) 5 MG/2ML IV SOLN: 10.0000 mg | Freq: Once | INTRAVENOUS | Status: DC | PRN

## 2024-02-05 MED ORDER — CHLORHEXIDINE GLUCONATE 0.12 % MT SOLN: 15.0000 mL | Freq: Once | OROMUCOSAL | Status: AC

## 2024-02-05 MED ORDER — EPHEDRINE SULFATE-NACL 50-0.9 MG/10ML-% IV SOSY
PREFILLED_SYRINGE | INTRAVENOUS | Status: DC | PRN
  Administered 2024-02-05: 5 mg via INTRAVENOUS

## 2024-02-05 MED ORDER — 0.9 % SODIUM CHLORIDE (POUR BTL) OPTIME
TOPICAL | Status: DC | PRN
  Administered 2024-02-05: 1000 mL

## 2024-02-05 MED ORDER — INSULIN ASPART 100 UNIT/ML IJ SOLN
0.0000 [IU] | INTRAMUSCULAR | Status: DC | PRN
  Filled 2024-02-05: qty 0.07

## 2024-02-05 MED ORDER — DOXYCYCLINE HYCLATE 100 MG PO TABS
100.0000 mg | ORAL_TABLET | Freq: Two times a day (BID) | ORAL | Status: DC
  Administered 2024-02-05 – 2024-02-06 (×2): 100 mg via ORAL
  Filled 2024-02-05 (×2): qty 1

## 2024-02-05 MED ORDER — ONDANSETRON HCL 4 MG/2ML IJ SOLN
INTRAMUSCULAR | Status: DC | PRN
  Administered 2024-02-05: 4 mg via INTRAVENOUS

## 2024-02-05 MED ORDER — LIDOCAINE HCL (PF) 1 % IJ SOLN
INTRAMUSCULAR | Status: AC
  Filled 2024-02-05: qty 30

## 2024-02-05 MED ORDER — PHENYLEPHRINE 80 MCG/ML (10ML) SYRINGE FOR IV PUSH (FOR BLOOD PRESSURE SUPPORT)
PREFILLED_SYRINGE | INTRAVENOUS | Status: DC | PRN
  Administered 2024-02-05 (×2): 80 ug via INTRAVENOUS

## 2024-02-05 MED ORDER — FENTANYL CITRATE (PF) 100 MCG/2ML IJ SOLN
INTRAMUSCULAR | Status: AC
  Administered 2024-02-05: 50 ug via INTRAVENOUS
  Filled 2024-02-05: qty 2

## 2024-02-05 MED ORDER — PROPOFOL 500 MG/50ML IV EMUL
INTRAVENOUS | Status: DC | PRN
  Administered 2024-02-05: 50 ug/kg/min via INTRAVENOUS

## 2024-02-05 MED ORDER — ENOXAPARIN SODIUM 40 MG/0.4ML IJ SOSY
40.0000 mg | PREFILLED_SYRINGE | INTRAMUSCULAR | Status: DC
  Administered 2024-02-06: 40 mg via SUBCUTANEOUS
  Filled 2024-02-05: qty 0.4

## 2024-02-05 MED ORDER — ORAL CARE MOUTH RINSE: 15.0000 mL | Freq: Once | OROMUCOSAL | Status: AC

## 2024-02-05 MED ORDER — ACETAMINOPHEN 500 MG PO TABS: 1000.0000 mg | ORAL_TABLET | Freq: Once | ORAL | Status: DC

## 2024-02-05 MED ORDER — CHLORHEXIDINE GLUCONATE 0.12 % MT SOLN
OROMUCOSAL | Status: AC
  Administered 2024-02-05: 15 mL via OROMUCOSAL
  Filled 2024-02-05: qty 15

## 2024-02-05 MED ORDER — OXYCODONE HCL 5 MG PO TABS: 5.0000 mg | ORAL_TABLET | Freq: Once | ORAL | Status: DC | PRN

## 2024-02-05 MED ORDER — PROPOFOL 10 MG/ML IV BOLUS
INTRAVENOUS | Status: DC | PRN
  Administered 2024-02-05: 30 mg via INTRAVENOUS

## 2024-02-05 MED ORDER — OXYCODONE HCL 5 MG/5ML PO SOLN: 5.0000 mg | Freq: Once | ORAL | Status: DC | PRN

## 2024-02-05 NOTE — Anesthesia Preprocedure Evaluation (Addendum)
 "                                  Anesthesia Evaluation  Patient identified by MRN, date of birth, ID band Patient awake    Reviewed: Allergy & Precautions, NPO status , Patient's Chart, lab work & pertinent test results  History of Anesthesia Complications Negative for: history of anesthetic complications  Airway Mallampati: III  TM Distance: >3 FB Neck ROM: Full    Dental  (+) Dental Advisory Given, Missing 3 teeth on the top, 4 teeth on the bottom:   Pulmonary neg shortness of breath, neg sleep apnea, COPD (no recent flares),  COPD inhaler, neg recent URI, former smoker   Pulmonary exam normal breath sounds clear to auscultation       Cardiovascular hypertension, (-) angina + CAD (non-obstructive), + Peripheral Vascular Disease (left common femoral to above-knee popliteal bypass with PTFE by Dr. Gerlean and also right common iliac stenting by Dr. Laurence) and + DVT  (-) Past MI, (-) Cardiac Stents and (-) CABG (-) dysrhythmias + Valvular Problems/Murmurs (mild) AI  Rhythm:Regular Rate:Normal  Carotid artery disease, HLD  TTE 05/14/2023: IMPRESSIONS    1. Left ventricular ejection fraction, by estimation, is 50 to 55%. The  left ventricle has low normal function. The left ventricle has no regional  wall motion abnormalities. Left ventricular diastolic parameters are  consistent with Grade I diastolic  dysfunction (impaired relaxation).   2. Right ventricular systolic function is normal. The right ventricular  size is normal. Tricuspid regurgitation signal is inadequate for assessing  PA pressure.   3. The mitral valve is normal in structure. No evidence of mitral valve  regurgitation. No evidence of mitral stenosis.   4. The aortic valve is tricuspid. Aortic valve regurgitation is mild. No  aortic stenosis is present.     Neuro/Psych neg Seizures  Neuromuscular disease (peripheral polyneuropathy)    GI/Hepatic Neg liver ROS,GERD  Medicated,,  Endo/Other   diabetes, Type 2, Oral Hypoglycemic Agents, Insulin  Dependent    Renal/GU negative Renal ROS     Musculoskeletal  (+) Arthritis , Rheumatoid disorders,    Abdominal   Peds  Hematology  (+) Blood dyscrasia, anemia Lab Results      Component                Value               Date                      WBC                      5.2                 02/05/2024                HGB                      11.6 (L)            02/05/2024                HCT                      37.6 (L)            02/05/2024  MCV                      87.2                02/05/2024                PLT                      303                 02/05/2024              Anesthesia Other Findings 74 y.o. male with medical history significant of CAD, PAD (s/p L fem-pop bypass in 2007 and R iliac artery stent in 2013), HTN, HLD, DM2 COPD, and RA who p/w R foot pain c/f infection.  Last Plavix : 02/03/2024  Reproductive/Obstetrics                              Anesthesia Physical Anesthesia Plan  ASA: 3  Anesthesia Plan: MAC and Regional   Post-op Pain Management: Regional block* and Tylenol  PO (pre-op)*   Induction: Intravenous  PONV Risk Score and Plan: 1 and Ondansetron , Propofol  infusion, TIVA and Treatment may vary due to age or medical condition  Airway Management Planned: Natural Airway and Simple Face Mask  Additional Equipment:   Intra-op Plan:   Post-operative Plan:   Informed Consent: I have reviewed the patients History and Physical, chart, labs and discussed the procedure including the risks, benefits and alternatives for the proposed anesthesia with the patient or authorized representative who has indicated his/her understanding and acceptance.     Dental advisory given  Plan Discussed with: CRNA  Anesthesia Plan Comments: (Discussed potential risks of nerve blocks including, but not limited to, infection, bleeding, nerve damage, seizures, respiratory  depression, and potential failure of the block. Alternatives to nerve blocks discussed. All questions answered.  Discussed with patient risks of MAC including, but not limited to, minor pain or discomfort, hearing people in the room, and possible need for backup general anesthesia. Risks for general anesthesia also discussed including, but not limited to, sore throat, hoarse voice, chipped/damaged teeth, injury to vocal cords, nausea and vomiting, allergic reactions, lung infection, heart attack, stroke, and death. All questions answered. )         Anesthesia Quick Evaluation  "

## 2024-02-05 NOTE — Anesthesia Procedure Notes (Signed)
 Anesthesia Regional Block: Popliteal block   Pre-Anesthetic Checklist: , timeout performed,  Correct Patient, Correct Site, Correct Laterality,  Correct Procedure, Correct Position, site marked,  Risks and benefits discussed,  Surgical consent,  Pre-op evaluation,  At surgeon's request and post-op pain management  Laterality: Right  Prep: chloraprep       Needles:  Injection technique: Single-shot  Needle Type: Echogenic Stimulator Needle     Needle Length: 9cm  Needle Gauge: 21     Additional Needles:   Procedures:,,,, ultrasound used (permanent image in chart),,    Narrative:  Start time: 02/05/2024 12:28 PM End time: 02/05/2024 12:33 PM Injection made incrementally with aspirations every 5 mL.  Performed by: Personally  Anesthesiologist: Peggye Delon Brunswick, MD  Additional Notes: Pre-block numbness in toes.  Discussed risks and benefits of nerve block including, but not limited to, prolonged and/or permanent nerve injury involving sensory and/or motor function. Monitors were applied and a time-out was performed. The nerve and associated structures were visualized under ultrasound guidance. After negative aspiration, local anesthetic was slowly injected around the nerve. There was no evidence of high pressure during the procedure. There were no paresthesias. VSS remained stable and the patient tolerated the procedure well.

## 2024-02-05 NOTE — Anesthesia Procedure Notes (Signed)
 Anesthesia Regional Block: Adductor canal block   Pre-Anesthetic Checklist: , timeout performed,  Correct Patient, Correct Site, Correct Laterality,  Correct Procedure, Correct Position, site marked,  Risks and benefits discussed,  Surgical consent,  Pre-op evaluation,  At surgeon's request and post-op pain management  Laterality: Right  Prep: chloraprep       Needles:  Injection technique: Single-shot  Needle Type: Echogenic Stimulator Needle     Needle Length: 9cm  Needle Gauge: 21     Additional Needles:   Procedures:,,,, ultrasound used (permanent image in chart),,    Narrative:  Start time: 02/05/2024 12:34 PM End time: 02/05/2024 12:37 PM Injection made incrementally with aspirations every 5 mL.  Performed by: Personally  Anesthesiologist: Peggye Delon Brunswick, MD  Additional Notes: Discussed risks and benefits of nerve block including, but not limited to, prolonged and/or permanent nerve injury involving sensory and/or motor function. Monitors were applied and a time-out was performed. The nerve and associated structures were visualized under ultrasound guidance. After negative aspiration, local anesthetic was slowly injected around the nerve. There was no evidence of high pressure during the procedure. There were no paresthesias. VSS remained stable and the patient tolerated the procedure well.

## 2024-02-05 NOTE — Progress Notes (Signed)
 History and Physical Interval Note:  02/05/2024 11:42 AM  [name]  has presented today for surgery, with the diagnosis of gangrene right forefoot.  The various methods of treatment have been discussed with the patient and family. After consideration of risks, benefits and other options for treatment, the patient has consented to   Procedures with comments: AMPUTATION, FOOT, TRANSMETATARSAL (Right) - TMA r foot, may need pre op block as a surgical intervention.  The patient's history has been reviewed, patient examined, no change in status, stable for surgery.  I have reviewed the patient's chart and labs.  Questions were answered to the patient's satisfaction.     Marsa FALCON Levern Pitter

## 2024-02-05 NOTE — Progress Notes (Signed)
 "          [name]                                                                              Keigan Tafoya, is a 74 y.o. male, DOB - February 08, 1950, FMW:[id number] Admit date - 02/04/2024    Outpatient Primary MD for the patient is [name], MD  LOS - 1  days  Chief Complaint  Patient presents with   Foot Pain       Brief summary   Patient is a 74 year old male with CAD, PAD status post left femoropopliteal bypass in 2007, right iliac artery stent in 2013, HTN, HLP, DM type II, COPD, RA presented with right foot pain and infection.  Patient reported severe pain in the right third toe, persisted for several days and prevented sleep for 3 days.  Patient saw a vascular specialist in early January due to toe pain related to PAD, underwent right lower extremity stenting and initially had decreased pain.  Unfortunately this relief was short-lived and pain worsened with diffuse swelling of the toes.  He was evaluated by vascular surgery and recommended oral antibiotics with no significant improvement.  Patient was seen by podiatry in ED and recommended right foot TMA.   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  - NPO, OR today for right foot transmetatarsal amputation. - Continue pain control, IV antibiotics - Hold aspirin , Plavix , will resume once cleared by podiatry    COPD  GOLD II -Current with no acute wheezing, continue DuoNebs, Breo Ellipta     Rheumatoid arthritis (HCC) - Hold hydroxychloroquine  for now    Gastroesophageal reflux disease -Continue PPI  Diabetes mellitus type 2, uncontrolled with hyperglycemia, IDDM Hold oral hypoglycemics -Placed on sliding scale insulin , Semglee  15 units daily CBG (last 3)  Recent Labs    02/04/24 1732 02/04/24 2156 02/05/24 0607  GLUCAP 87 126* 119*       Essential hypertension -Continue Toprol -XL     Dyslipidemia, goal LDL below 70 - Continue  Lipitor  80 mg daily   Estimated body mass index is 24.37 kg/m as calculated from the following:   Height as of this encounter: 5' 9 (1.753 m).   Weight as of this encounter: 74.8 kg.  Code Status: Full code DVT Prophylaxis:  enoxaparin  (LOVENOX ) injection 40 mg Start: 02/04/24 2200   Level of Care: Level of care: Med-Surg Family Communication: Updated patient's wife at the bedside Disposition Plan:      Remains inpatient appropriate:      Procedures:    Consultants:   Podiatry  Antimicrobials:   Anti-infectives (From admission, onward)    Start     Dose/Rate Route Frequency Ordered Stop   02/06/24 0500  vancomycin  (VANCOREADY) IVPB 1500 mg/300 mL        1,500 mg 150 mL/hr over 120 Minutes Intravenous Every 24 hours 02/05/24 0739     02/05/24 0830  vancomycin  (VANCOREADY) IVPB 500 mg/100 mL        500 mg 100 mL/hr over 60 Minutes Intravenous  Once 02/05/24 0738     02/05/24 0500  vancomycin  (VANCOCIN ) IVPB 1000 mg/200 mL premix  Status:  Discontinued        1,000 mg 200 mL/hr over 60 Minutes Intravenous Every 12 hours 02/04/24 1509 02/05/24 0739   02/04/24 1815  metroNIDAZOLE  (FLAGYL ) IVPB 500 mg        500 mg 100 mL/hr over 60 Minutes Intravenous  Once 02/04/24 1718 02/04/24 1957   02/04/24 1515  ceFEPIme  (MAXIPIME ) 2 g in sodium chloride  0.9 % 100 mL IVPB        2 g 200 mL/hr over 30 Minutes Intravenous Every 8 hours 02/04/24 1509     02/04/24 1100  vancomycin  (VANCOREADY) IVPB 1500 mg/300 mL  Status:  Discontinued        1,500 mg 150 mL/hr over 120 Minutes Intravenous  Once 02/04/24 1056 02/05/24 0738   02/04/24 1045  cefTRIAXone  (ROCEPHIN ) 2 g in sodium chloride  0.9 % 100 mL IVPB       Placed in And Linked Group   2 g 200 mL/hr over 30 Minutes Intravenous Once 02/04/24 1042 02/04/24 1628   02/04/24 1045  metroNIDAZOLE  (FLAGYL ) IVPB 500 mg  Status:  Discontinued       Placed in And Linked Group   500 mg 100 mL/hr over 60 Minutes Intravenous  Once 02/04/24  1042 02/04/24 1719   02/04/24 1045  vancomycin  (VANCOCIN ) IVPB 1000 mg/200 mL premix  Status:  Discontinued       Placed in And Linked Group   1,000 mg 200 mL/hr over 60 Minutes Intravenous  Once 02/04/24 1042 02/04/24 1056          Medications  acetaminophen   1,000 mg Oral TID   atorvastatin   80 mg Oral Daily   enoxaparin  (LOVENOX ) injection  40 mg Subcutaneous Q24H   fluticasone  furoate-vilanterol  1 puff Inhalation Daily   insulin  aspart  0-15 Units Subcutaneous TID WC   insulin  glargine-yfgn  15 Units Subcutaneous QHS   metoprolol  succinate  12.5 mg Oral Daily      Subjective:   [name] was seen and examined today.  Seen this morning, no acute complaints, wife at the bedside.  Awaiting surgery today.  States pain in the foot controlled due to medications.  Patient denies dizziness, chest pain, shortness of breath, abdominal pain, N/V. Objective:   Vitals:   02/04/24 1653 02/04/24 2200 02/05/24 0600 02/05/24 0827  BP: (!) 119/104 138/78 107/85 131/66  Pulse: 72 82 93 68  Resp: 18   16  Temp: (!) 97.3 F (36.3 C) 97.9 F (36.6 C) 98 F (36.7 C) (!) 97.5 F (36.4 C)  TempSrc: Oral Oral Oral Oral  SpO2: 94% 96% 94% 94%  Weight:      Height:        Intake/Output Summary (Last 24 hours) at 02/05/2024 1039 Last data filed at 02/05/2024 0423 Gross per 24 hour  Intake --  Output 700 ml  Net -700 ml     Wt Readings from Last 3 Encounters:  02/04/24 74.8 kg  01/27/24 73.5 kg  01/26/24 73.9 kg     Exam General: Alert and oriented x 3, NAD Cardiovascular: S1 S2 auscultated,  RRR Respiratory: Clear to auscultation bilaterally, no wheezing Gastrointestinal: Soft, nontender, nondistended, + bowel sounds Ext: no pedal edema bilaterally Neuro: No new deficits Skin: RLE toes with diffuse edema and skin sloughing Psych: Normal affect     Data Reviewed:  I have personally reviewed following labs    CBC Lab Results  Component Value Date  WBC 5.2  02/05/2024   RBC 4.31 02/05/2024   HGB 11.6 (L) 02/05/2024   HCT 37.6 (L) 02/05/2024   MCV 87.2 02/05/2024   MCH 26.9 02/05/2024   PLT 303 02/05/2024   MCHC 30.9 02/05/2024   RDW 15.2 02/05/2024   LYMPHSABS 2.2 02/04/2024   MONOABS 0.5 02/04/2024   EOSABS 0.2 02/04/2024   BASOSABS 0.1 02/04/2024     Last metabolic panel Lab Results  Component Value Date   NA 138 02/05/2024   K 4.2 02/05/2024   CL 104 02/05/2024   CO2 27 02/05/2024   BUN 12 02/05/2024   CREATININE 0.91 02/05/2024   GLUCOSE 115 (H) 02/05/2024   GFRNONAA >60 02/05/2024   GFRAA 83 01/27/2020   CALCIUM  8.5 (L) 02/05/2024   PROT 7.4 02/04/2024   ALBUMIN 4.2 02/04/2024   LABGLOB 2.7 08/02/2020   AGRATIO 1.7 08/02/2020   BILITOT 0.3 02/04/2024   ALKPHOS 74 02/04/2024   AST 22 02/04/2024   ALT 9 02/04/2024   ANIONGAP 7 02/05/2024    CBG (last 3)  Recent Labs    02/04/24 1732 02/04/24 2156 02/05/24 0607  GLUCAP 87 126* 119*      Coagulation Profile: No results for input(s): INR, PROTIME in the last 168 hours.   Radiology Studies: I have personally reviewed the imaging studies  MR FOOT RIGHT WO CONTRAST Result Date: 02/04/2024 CLINICAL DATA:  Osteomyelitis, foot EXAM: MRI OF THE RIGHT FOREFOOT WITHOUT CONTRAST TECHNIQUE: Multiplanar, multisequence MR imaging of the right forefoot was performed. No intravenous contrast was administered. COMPARISON:  Radiographs 02/04/2024 and 05/05/2020. 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 No evidence of acute fracture, dislocation or osteomyelitis. No significant joint effusions. Ligaments Intact Lisfranc ligament. The collateral ligaments of the metatarsophalangeal joints are intact. Muscles and Tendons No evidence of tendon tear or significant tenosynovitis. Mild T2 hyperintensity within the flexor musculature. No significant  muscular atrophy. Soft tissues Generalized forefoot subcutaneous edema, greatest dorsally. Edema extends into the great toe. No organized fluid collection, foreign body or obvious soft tissue ulceration. Prominent vascular calcifications, better seen on radiographs. IMPRESSION: 1. No evidence of osteomyelitis or septic joint. 2. Nonspecific generalized forefoot subcutaneous edema, greatest dorsally. No organized fluid collection, foreign body or obvious soft tissue ulceration. 3. 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 Electronically Signed   By: Elsie Perone M.D.   On: 02/04/2024 13:46   DG Foot Complete Right Result Date: 02/04/2024 CLINICAL DATA:  Right foot pain/wound.  Evaluate for osteomyelitis. EXAM: RIGHT FOOT COMPLETE - 3 VIEW COMPARISON:  Prior radiographs of the right foot 05/05/2020 FINDINGS: Degenerative osteoarthritis of the great toe MTP joint is slightly progressed compared to prior. Remote healed fracture of the fifth metatarsal. No evidence of focal erosion or decreased mineralization to suggest osteomyelitis. Extensive atherosclerotic vascular calcifications throughout the small vessels. IMPRESSION: 1. No conventional radiographic evidence of osteomyelitis. 2. Small vessel peripheral arterial disease. 3. 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 Electronically Signed   By: Wilkie Lent M.D.   On: 02/04/2024 11:50       [name] M.D. [name] 02/05/2024, 10:39 AM  Available via Epic secure chat 7am-7pm After 7 pm, please refer to night coverage provider listed on amion.    "

## 2024-02-05 NOTE — Anesthesia Procedure Notes (Signed)
 Procedure Name: MAC Date/Time: 02/05/2024 1:30 PM  Performed by: Boyce Shilling, CRNAPre-anesthesia Checklist: Patient identified, Emergency Drugs available, Suction available, Timeout performed and Patient being monitored Patient Re-evaluated:Patient Re-evaluated prior to induction Oxygen Delivery Method: Simple face mask Induction Type: IV induction Dental Injury: Teeth and Oropharynx as per pre-operative assessment

## 2024-02-05 NOTE — Transfer of Care (Signed)
 Immediate Anesthesia Transfer of Care Note  Patient: [name]  Procedure(s) Performed: AMPUTATION, FOOT, TRANSMETATARSAL (Right: Toe)  Patient Location: PACU  Anesthesia Type:MAC combined with regional for post-op pain  Level of Consciousness: awake and drowsy  Airway & Oxygen Therapy: Patient Spontanous Breathing and Patient connected to nasal cannula oxygen  Post-op Assessment: Report given to RN and Post -op Vital signs reviewed and stable  Post vital signs: Reviewed and stable  Last Vitals:  Vitals Value Taken Time  BP    Temp    Pulse    Resp    SpO2      Last Pain:  Vitals:   02/05/24 1159  TempSrc:   PainSc: 7          Complications: No notable events documented.

## 2024-02-05 NOTE — Op Note (Signed)
 Full Operative Report  Date of Operation: 1:57 PM, 02/05/2024   Patient: [name] - 74 y.o. male  Surgeon: Malvin Marsa FALCON, DPM   Assistant: None  Diagnosis: Gangrene of right forefoot  Procedure:  1.  Transmetatarsal amputation, right foot    Anesthesia: Monitor Anesthesia Care  Peggye Delon Brunswick, MD  Anesthesiologist: Peggye Delon Brunswick, MD CRNA: Boyce Shilling, CRNA   Estimated Blood Loss: Minimal   Hemostasis: 1) Anatomical dissection, mechanical compression, electrocautery 2) No tourniquet was used  Implants: Implant Name Type Inv. Item Serial No. Manufacturer Lot No. LRB No. Used Action  COLLAGEN CELLERATERX 1 GRAM - [id number] Miscellaneous COLLAGEN CELLERATERX 1 GRAM [id number] [facility] [employer]  Right 1 Implanted    Materials: Prolene 2-0, skin staples  Injectables: 1) Pre-operatively: Preop block per anesthesia 2) Post-operatively: None   Specimens: - Pathology: Right forefoot - Microbiology: None   Antibiotics: IV antibiotics given per schedule on the floor  Drains: None  Complications: Patient tolerated the procedure well without complication.   Operative findings: As below in detailed report  Indications for Procedure: Malvin Morrish presents to Malvin Marsa FALCON, DPM with a chief complaint of gangrene of the right forefoot.  Status post prior revascularization with vascular surgery.  The patient has failed conservative treatments of various modalities. At this time the patient has elected to proceed with surgical correction. All alternatives, risks, and complications of the procedures were thoroughly explained to the patient. Patient exhibits appropriate understanding of all discussion points and informed consent was signed and obtained in the chart with no guarantees to surgical outcome given or implied.  Description of Procedure: Patient was brought to the operating room. Patient remained on their hospital bed in the  supine position. A surgical timeout was performed and all members of the operating room, the procedure, and the surgical site were identified. anesthesia occurred as per anesthesia record. Local anesthetic as previously described was then injected about the operative field in a local infiltrative block.  The operative lower extremity as noted above was then prepped and draped in the usual sterile manner. The following procedure then began.  Attention was directed to the RIGHT lower extremity. A fish-mouth type incision was made proximal to the web spaces and encompassed the entire forefoot. The full-thickness incision was made with a longer plantar flap to allow for wound closure. The incision was continued through the soft tissue down to the shafts of the metatarsal bones. A 15 blade was then used to free up the periosteum on all the metatarsal shafts. Using an oscillating saw, the metatarsals were cut in a dorsal distal to plantar proximal orientation. The first metatarsal was beveled so that the medial cortex was shorter than the lateral, and the fifth metatarsal was beveled so that the lateral cortex was shorter than the medial, thus less prominent. The amputation was done so that a metatarsal parabola was maintained.  The distal portion including all the digits were freed from the metatarsals and soft tissue attachments. The specimen was passed off the field and sent for gross pathology. All remaining non-viable and necrotic tissues were sharply resected and removed. Extensor and flexors tendons were grasped with a hemostat and cut proximally. Good bleeding was seen from the dorsal and plantar flaps.. The surgical site was then flushed with saline.  Hemostasis was achieved as needed with electrocautery.  Then in order to promote healing and reduce drainage 1 g of collagen powder celerate was implanted within the surgical site and was closed  over.  The plantar flap was brought in approximation with the dorsal  flap and the sutures material previously described was used for closure. Care was taken not to place the flaps under tension in order not to jeopardize the vascular supply.  The surgical site was then dressed with Xeroform 4 x 4 ABD pad Kerlix Ace roll. The patient tolerated both the procedure and anesthesia well with vital signs stable throughout. The patient was transferred in good condition and all vital signs stable  from the OR to recovery under the discretion of anesthesia.  Condition: Vital signs stable, neurovascular status unchanged from preoperative   Surgical plan:  Expect clean margin. Good bleeding.  Nonweightbearing and postop shoe for protection. PT OT tomorrow.  Rec 5 days po abx for prophyalxis doxycyline ok.   The patient will be nonweightbearing in a postop shoe to the operative limb until further instructed. The dressing is to remain clean, dry, and intact. Will continue to follow unless noted elsewhere.   Marsa Honour, DPM [facility]

## 2024-02-05 NOTE — Progress Notes (Addendum)
 Pharmacy Antibiotic Note  [name] is a 74 y.o. male admitted on 02/04/2024 presenting with R foot pain and concern for infection.  Pharmacy has been consulted for vancomycin  dosing.    Loading dose not given in ED.  Adjust dose to:  1/29 Vancomycin  1500mg  Q 24 hr with Est AUC: 432 Scr used: 0.91 mg/dL; Vd coeff: 0.72 L/kg  Plan: Vancomycin  1500 mg Q24hr Monitor cultures, clinical status, renal function, vancomycin  level Narrow abx as able and f/u duration    Height: 5' 9 (175.3 cm) Weight: 74.8 kg (165 lb) IBW/kg (Calculated) : 70.7  Temp (24hrs), Avg:98.1 F (36.7 C), Min:97.3 F (36.3 C), Max:98.4 F (36.9 C)  Recent Labs  Lab 02/04/24 1051 02/04/24 1110 02/04/24 1215 02/04/24 1940 02/05/24 0614  WBC  --   --  6.3 6.1 5.2  CREATININE 0.86  --   --  0.98 0.91  LATICACIDVEN  --  0.7  --   --   --     Estimated Creatinine Clearance: 72.3 mL/min (by C-G formula based on SCr of 0.91 mg/dL).    Allergies[1]  Antimicrobials: Cefe 1/28>> Vanc 1/29>> CTX 1/28 MTZ 1/28  Microbiology  1/28 Bcx:  1/28 MRSA:    Jinnie Door, PharmD, BCPS, BCCP Clinical Pharmacist  Please check AMION for all [facility] Pharmacy phone numbers After 10:00 PM, call Main Pharmacy [phone]     [1]  Allergies Allergen Reactions   Penicillins Hives

## 2024-02-05 NOTE — Anesthesia Postprocedure Evaluation (Signed)
"   Anesthesia Post Note  Patient: [name]  Procedure(s) Performed: AMPUTATION, FOOT, TRANSMETATARSAL (Right: Toe)     Patient location during evaluation: PACU Anesthesia Type: Regional and MAC Level of consciousness: awake Pain management: pain level controlled Vital Signs Assessment: post-procedure vital signs reviewed and stable Respiratory status: spontaneous breathing, nonlabored ventilation and respiratory function stable Cardiovascular status: stable and blood pressure returned to baseline Postop Assessment: no apparent nausea or vomiting Anesthetic complications: no   No notable events documented.  Last Vitals:  Vitals:   02/05/24 1430 02/05/24 1511  BP: 113/60 105/70  Pulse: 67 61  Resp: 10 16  Temp:  (!) 36.4 C  SpO2: 92% 99%    Last Pain:  Vitals:   02/05/24 1430  TempSrc:   PainSc: Asleep                 Delon Aisha Arch      "

## 2024-02-06 ENCOUNTER — Other Ambulatory Visit (HOSPITAL_COMMUNITY)

## 2024-02-06 ENCOUNTER — Other Ambulatory Visit (HOSPITAL_COMMUNITY): Payer: Self-pay

## 2024-02-06 ENCOUNTER — Encounter (HOSPITAL_COMMUNITY): Payer: Self-pay | Admitting: Podiatry

## 2024-02-06 ENCOUNTER — Ambulatory Visit

## 2024-02-06 ENCOUNTER — Encounter (HOSPITAL_COMMUNITY)

## 2024-02-06 DIAGNOSIS — E119 Type 2 diabetes mellitus without complications: Secondary | ICD-10-CM

## 2024-02-06 DIAGNOSIS — E785 Hyperlipidemia, unspecified: Secondary | ICD-10-CM | POA: Diagnosis not present

## 2024-02-06 DIAGNOSIS — I96 Gangrene, not elsewhere classified: Secondary | ICD-10-CM | POA: Diagnosis not present

## 2024-02-06 DIAGNOSIS — L089 Local infection of the skin and subcutaneous tissue, unspecified: Secondary | ICD-10-CM | POA: Diagnosis not present

## 2024-02-06 LAB — GLUCOSE, CAPILLARY
Glucose-Capillary: 85 mg/dL (ref 70–99)
Glucose-Capillary: 112 mg/dL — ABNORMAL HIGH (ref 70–99)
Glucose-Capillary: 168 mg/dL — ABNORMAL HIGH (ref 70–99)

## 2024-02-06 MED ORDER — HYDROCODONE-ACETAMINOPHEN 7.5-325 MG PO TABS
1.0000 | ORAL_TABLET | Freq: Four times a day (QID) | ORAL | 0 refills | Status: AC | PRN
  Filled 2024-02-06: qty 15, 4d supply, fill #0

## 2024-02-06 MED ORDER — DOXYCYCLINE HYCLATE 100 MG PO TABS
100.0000 mg | ORAL_TABLET | Freq: Two times a day (BID) | ORAL | 0 refills | Status: AC
  Filled 2024-02-06: qty 10, 5d supply, fill #0

## 2024-02-06 MED ORDER — HYDROMORPHONE HCL 1 MG/ML IJ SOLN
1.0000 mg | INTRAMUSCULAR | Status: DC | PRN
  Administered 2024-02-06: 1 mg via INTRAVENOUS
  Filled 2024-02-06: qty 1

## 2024-02-06 NOTE — Plan of Care (Signed)
" °  Problem: Education: Goal: Knowledge of General Education information will improve Description: Including pain rating scale, medication(s)/side effects and non-pharmacologic comfort measures Outcome: Progressing   Problem: Health Behavior/Discharge Planning: Goal: Ability to manage health-related needs will improve Outcome: Progressing   Problem: Clinical Measurements: Goal: Ability to maintain clinical measurements within normal limits will improve Outcome: Progressing Goal: Will remain free from infection Outcome: Progressing Goal: Diagnostic test results will improve Outcome: Progressing Goal: Respiratory complications will improve Outcome: Progressing Goal: Cardiovascular complication will be avoided Outcome: Progressing   Problem: Activity: Goal: Risk for activity intolerance will decrease Outcome: Progressing   Problem: Nutrition: Goal: Adequate nutrition will be maintained Outcome: Progressing   Problem: Coping: Goal: Level of anxiety will decrease Outcome: Progressing   Problem: Elimination: Goal: Will not experience complications related to bowel motility Outcome: Progressing Goal: Will not experience complications related to urinary retention Outcome: Progressing   Problem: Pain Managment: Goal: General experience of comfort will improve and/or be controlled Outcome: Progressing   Problem: Safety: Goal: Ability to remain free from injury will improve Outcome: Progressing   Problem: Skin Integrity: Goal: Risk for impaired skin integrity will decrease Outcome: Progressing   Problem: Education: Goal: Ability to describe self-care measures that may prevent or decrease complications (Diabetes Survival Skills Education) will improve Outcome: Progressing Goal: Individualized Educational Video(s) Outcome: Progressing   Problem: Coping: Goal: Ability to adjust to condition or change in health will improve Outcome: Progressing   Problem: Fluid  Volume: Goal: Ability to maintain a balanced intake and output will improve Outcome: Progressing   Problem: Health Behavior/Discharge Planning: Goal: Ability to identify and utilize available resources and services will improve Outcome: Progressing Goal: Ability to manage health-related needs will improve Outcome: Progressing   Problem: Metabolic: Goal: Ability to maintain appropriate glucose levels will improve Outcome: Progressing   Problem: Nutritional: Goal: Maintenance of adequate nutrition will improve Outcome: Progressing Goal: Progress toward achieving an optimal weight will improve Outcome: Progressing   Problem: Skin Integrity: Goal: Risk for impaired skin integrity will decrease Outcome: Progressing   Problem: Tissue Perfusion: Goal: Adequacy of tissue perfusion will improve Outcome: Progressing   Problem: Education: Goal: Knowledge of the prescribed therapeutic regimen will improve Outcome: Progressing   Problem: Bowel/Gastric: Goal: Gastrointestinal status for postoperative course will improve Outcome: Progressing   Problem: Cardiac: Goal: Ability to maintain an adequate cardiac output Outcome: Progressing Goal: Will show no evidence of cardiac arrhythmias Outcome: Progressing   Problem: Nutritional: Goal: Will attain and maintain optimal nutritional status Outcome: Progressing   Problem: Neurological: Goal: Will regain or maintain usual level of consciousness Outcome: Progressing   Problem: Clinical Measurements: Goal: Ability to maintain clinical measurements within normal limits Outcome: Progressing Goal: Postoperative complications will be avoided or minimized Outcome: Progressing   Problem: Respiratory: Goal: Will regain and/or maintain adequate ventilation Outcome: Progressing Goal: Respiratory status will improve Outcome: Progressing   Problem: Skin Integrity: Goal: Demonstrates signs of wound healing without infection Outcome:  Progressing   Problem: Urinary Elimination: Goal: Will remain free from infection Outcome: Progressing Goal: Ability to achieve and maintain adequate urine output Outcome: Progressing   "

## 2024-02-06 NOTE — Evaluation (Signed)
 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 PMH: COPD, HTN, PVD, DVT, DM2, RA, CAD, multiple LE revascularizations.  Clinical Impression  Patient presents with decreased mobility due to NWB on R LE post TMA.  He was able to ambulate with cane independent PTA, now needing CGA for safety with RW and knee scooter though much improved tolerance with knee scooter as HR up to 144 after walker ambulation.  Family present and supportive and understanding of education including one step for home entry.  Safe for home with assistance and follow up HHPT. PT will sign off as for home today.       If plan is discharge home, recommend the following: A little help with walking and/or transfers;A little help with bathing/dressing/bathroom;Help with stairs or ramp for entrance;Assist for transportation;Assistance with cooking/housework   Can travel by private vehicle        Equipment Recommendations Other (comment) (knee scooter)  Recommendations for Other Services       Functional Status Assessment Patient has had a recent decline in their functional status and demonstrates the ability to make significant improvements in function in a reasonable and predictable amount of time.     Precautions / Restrictions Precautions Precautions: Fall Required Braces or Orthoses: Other Brace Other Brace: R post op shoe Restrictions Weight Bearing Restrictions Per Provider Order: Yes RLE Weight Bearing Per Provider Order: Non weight bearing      Mobility  Bed Mobility Overal bed mobility: Modified Independent                  Transfers Overall transfer level: Needs assistance Equipment used: Rolling walker (2 wheels) Transfers: Sit to/from Stand Sit to Stand: Contact guard  assist           General transfer comment: cues for technique and to avoid WB on R foot; assist and cues after demonstration for proper transition with knee scooter    Ambulation/Gait Ambulation/Gait assistance: Contact guard assist Gait Distance (Feet): 25 Feet (&80' with knee scooter) Assistive device: Rolling walker (2 wheels), Knee scooter Gait Pattern/deviations: Step-to pattern, Decreased stride length       General Gait Details: after ambulation with RW HR 144, rested then practiced one step with RW, rested in supine then practiced with knee scooter  Stairs Stairs: Yes Stairs assistance: Min assist Stair Management: Backwards, With walker Number of Stairs: 1 General stair comments: demonstrated then pt performed with reverse technique, min a for balance and brining walker up onto step due to balance with NWB on R  Wheelchair Mobility     Tilt Bed    Modified Rankin (Stroke Patients Only)       Balance Overall balance assessment: Needs assistance   Sitting balance-Leahy Scale: Good     Standing balance support: Bilateral upper extremity supported Standing balance-Leahy Scale: Poor Standing balance comment: UE support needed for balance with NWB on R                             Pertinent Vitals/Pain Pain Assessment Pain Assessment: Faces Faces Pain Scale: Hurts little more Pain Location: R foot Pain Descriptors / Indicators: Aching Pain Intervention(s): Monitored during session, RN gave pain meds during session    Home Living Family/patient expects to be  discharged to:: Private residence Living Arrangements: Spouse/significant other Available Help at Discharge: Family;Available PRN/intermittently Type of Home: House Home Access: Stairs to enter Entrance Stairs-Rails: None Entrance Stairs-Number of Steps: 1+1   Home Layout: Other (Comment) (one step into den) Home Equipment: Agricultural Consultant (2 wheels);Cane - single point      Prior  Function Prior Level of Function : Independent/Modified Independent             Mobility Comments: walking with cane       Extremity/Trunk Assessment   Upper Extremity Assessment Upper Extremity Assessment: Defer to OT evaluation    Lower Extremity Assessment Lower Extremity Assessment: RLE deficits/detail RLE Deficits / Details: wearing post op shoe over dressing on foot post TMA; lifts leg unaided and able to flex ankle though limited    Cervical / Trunk Assessment Cervical / Trunk Assessment: Normal  Communication   Communication Communication: Impaired Factors Affecting Communication: Hearing impaired    Cognition Arousal: Alert Behavior During Therapy: Impulsive   PT - Cognitive impairments: No apparent impairments                         Following commands: Intact       Cueing Cueing Techniques: Verbal cues, Gestural cues     General Comments General comments (skin integrity, edema, etc.): family present, supportive, reinforcing education    Exercises     Assessment/Plan    PT Assessment All further PT needs can be met in the next venue of care  PT Problem List Decreased balance;Decreased mobility;Decreased knowledge of use of DME;Pain;Decreased activity tolerance       PT Treatment Interventions      PT Goals (Current goals can be found in the Care Plan section)  Acute Rehab PT Goals PT Goal Formulation: All assessment and education complete, DC therapy    Frequency       Co-evaluation               AM-PAC PT 6 Clicks Mobility  Outcome Measure Help needed turning from your back to your side while in a flat bed without using bedrails?: None Help needed moving from lying on your back to sitting on the side of a flat bed without using bedrails?: None Help needed moving to and from a bed to a chair (including a wheelchair)?: A Little Help needed standing up from a chair using your arms (e.g., wheelchair or bedside chair)?: A  Little Help needed to walk in hospital room?: A Little Help needed climbing 3-5 steps with a railing? : A Little 6 Click Score: 20    End of Session Equipment Utilized During Treatment: Gait belt;Other (comment) (post-op shoe) Activity Tolerance: Patient limited by fatigue Patient left: in bed;with call bell/phone within reach;with family/visitor present   PT Visit Diagnosis: Difficulty in walking, not elsewhere classified (R26.2);Pain Pain - Right/Left: Right Pain - part of body: Ankle and joints of foot    Time: 9044-8971 PT Time Calculation (min) (ACUTE ONLY): 33 min   Charges:   PT Evaluation $PT Eval Moderate Complexity: 1 Mod PT Treatments $Gait Training: 8-22 mins PT General Charges $$ ACUTE PT VISIT: 1 Visit         Micheline Portal, PT Acute Rehabilitation Services Office:[phone] 02/06/2024   Montie Portal 02/06/2024, 11:13 AM

## 2024-02-06 NOTE — Progress Notes (Signed)
" °  °  Durable Medical Equipment  (From admission, onward)           Start     Ordered   02/06/24 1009  For home use only DME Tub bench  Once       Comments: Transfer tub bench   02/06/24 1008   02/06/24 1009  For home use only DME Other see comment  Once       Comments: Knee scooter  Question:  Length of Need  Answer:  6 Months   02/06/24 1008            "

## 2024-02-06 NOTE — Evaluation (Signed)
 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 PMH: COPD, HTn, PVD, DVT, DM2, RA, CAD, multiple LE revascularizations.     Clinical Impressions Pt was ambulating with a cane, driving and independent in ADLs and IADLs. He lives with his wife who can provide PRN assistance. Pt presents with R foot pain and impaired standing balance. Requires CGA with RW to ambulate, fatigues easily, able to maintain NWB in post op shoe. Pt and family educated in use of tub transfer bench. Instructed to avoid attempting to stand to complete LB ADLs and to lean side to side instead. Pt and family verbalizing understanding. Will recommend HHOT to reinforce and address IADLs.      If plan is discharge home, recommend the following:   A little help with walking and/or transfers;A little help with bathing/dressing/bathroom;Assistance with cooking/housework;Help with stairs or ramp for entrance     Functional Status Assessment   Patient has had a recent decline in their functional status and demonstrates the ability to make significant improvements in function in a reasonable and predictable amount of time.     Equipment Recommendations   Tub/shower bench     Recommendations for Other Services         Precautions/Restrictions   Precautions Precautions: Fall Required Braces or Orthoses: Other Brace Other Brace: R post op shoe Restrictions Weight Bearing Restrictions Per Provider Order: Yes RLE Weight Bearing Per Provider Order: Non weight bearing     Mobility Bed Mobility Overal bed mobility: Modified Independent                  Transfers Overall transfer level: Needs assistance Equipment used: Rolling walker (2 wheels) Transfers: Sit to/from Stand Sit to  Stand: Contact guard assist           General transfer comment: cues for technique and to avoid WB on R foot      Balance Overall balance assessment: Needs assistance   Sitting balance-Leahy Scale: Good       Standing balance-Leahy Scale: Poor                             ADL either performed or assessed with clinical judgement   ADL Overall ADL's : Needs assistance/impaired Eating/Feeding: Independent;Bed level   Grooming: Set up;Sitting   Upper Body Bathing: Supervision/ safety;Sitting   Lower Body Bathing: Supervison/ safety;Sitting/lateral leans   Upper Body Dressing : Set up;Sitting   Lower Body Dressing: Moderate assistance;Bed level;Sitting/lateral leans Lower Body Dressing Details (indicate cue type and reason): assisted to don large sock and post op shoe, pt donned L shoe with set up           Tub/Shower Transfer Details (indicate cue type and reason): educated in tub transfer bench use Functional mobility during ADLs: Contact guard assist;Rolling walker (2 wheels) General ADL Comments: Educated pt and family in seated ADLs, not to attempt standing on one foot     Vision Ability to See in Adequate Light: 0 Adequate Patient Visual Report: No change from baseline       Perception         Praxis         Pertinent Vitals/Pain Pain Assessment Pain Assessment: Faces Faces Pain Scale: Hurts little more  Pain Location: R foot Pain Descriptors / Indicators: Aching Pain Intervention(s): Patient requesting pain meds-RN notified, Monitored during session     Extremity/Trunk Assessment Upper Extremity Assessment Upper Extremity Assessment: Overall WFL for tasks assessed;Right hand dominant   Lower Extremity Assessment Lower Extremity Assessment: Defer to PT evaluation   Cervical / Trunk Assessment Cervical / Trunk Assessment: Normal   Communication Communication Communication: Impaired Factors Affecting Communication: Hearing  impaired   Cognition Arousal: Alert Behavior During Therapy: Impulsive Cognition: No apparent impairments             OT - Cognition Comments: difficult to assess, pt HOH                 Following commands: Intact       Cueing  General Comments   Cueing Techniques: Verbal cues;Gestural cues;Visual cues      Exercises     Shoulder Instructions      Home Living Family/patient expects to be discharged to:: Private residence Living Arrangements: Spouse/significant other Available Help at Discharge: Family;Available PRN/intermittently Type of Home: House Home Access: Stairs to enter Entrance Stairs-Number of Steps: 1+1 Entrance Stairs-Rails: None Home Layout: Other (Comment) (one step into den)     Bathroom Shower/Tub: Chief Strategy Officer: Handicapped height     Home Equipment: Agricultural Consultant (2 wheels);Cane - single point          Prior Functioning/Environment Prior Level of Function : Independent/Modified Independent             Mobility Comments: walking with cane      OT Problem List: Impaired balance (sitting and/or standing)   OT Treatment/Interventions:        OT Goals(Current goals can be found in the care plan section)       OT Frequency:       Co-evaluation              AM-PAC OT 6 Clicks Daily Activity     Outcome Measure Help from another person eating meals?: None Help from another person taking care of personal grooming?: A Little Help from another person toileting, which includes using toliet, bedpan, or urinal?: A Little Help from another person bathing (including washing, rinsing, drying)?: A Little Help from another person to put on and taking off regular upper body clothing?: A Little Help from another person to put on and taking off regular lower body clothing?: A Little 6 Click Score: 19   End of Session Equipment Utilized During Treatment: Gait belt;Rolling walker (2 wheels) Nurse  Communication: Patient requests pain meds  Activity Tolerance: Patient tolerated treatment well Patient left: Other (comment) (walking with PT)  OT Visit Diagnosis: Unsteadiness on feet (R26.81);Pain Pain - Right/Left: Right Pain - part of body: Ankle and joints of foot                Time: 9058-8994 OT Time Calculation (min): 24 min Charges:  OT General Charges $OT Visit: 1 Visit OT Evaluation $OT Eval Low Complexity: 1 Low  Mliss HERO, OTR/L Acute Rehabilitation Services Office: [phone]   Kennth Mliss Helling 02/06/2024, 10:14 AM

## 2024-02-09 ENCOUNTER — Ambulatory Visit: Admitting: Gastroenterology

## 2024-02-09 LAB — CULTURE, BLOOD (ROUTINE X 2)
Culture: NO GROWTH
Culture: NO GROWTH
Special Requests: ADEQUATE

## 2024-02-10 ENCOUNTER — Other Ambulatory Visit: Payer: Self-pay

## 2024-02-10 ENCOUNTER — Ambulatory Visit: Admitting: Podiatry

## 2024-02-10 LAB — SURGICAL PATHOLOGY

## 2024-02-11 ENCOUNTER — Telehealth: Payer: Self-pay | Admitting: Podiatry

## 2024-02-11 NOTE — Telephone Encounter (Signed)
 cld bk and pt said to talk to Shickley. She will go to Library and fax forms. I adv they can pay $25 fee at appt 02/17/24. She said Ronnald and her fax is [phone]. I will wait for forms form pt/Linda

## 2024-02-12 ENCOUNTER — Telehealth: Payer: Self-pay

## 2024-02-12 ENCOUNTER — Telehealth: Payer: Self-pay | Admitting: Podiatry

## 2024-02-12 NOTE — Telephone Encounter (Signed)
 Patient called and left a message - he is having some swelling in his legs after his surgery last week -02/05/24. His first post op is scheduled 02/17/24 - is this concerning? Or can it wait until then?

## 2024-02-12 NOTE — Telephone Encounter (Signed)
 pt had me s/w [facility] again. I adv her I need new forms, she filled out the part that Dr completes. She will email to me and I gave her [email or web address] to send

## 2024-02-12 NOTE — Telephone Encounter (Signed)
 Emailed [employer] and faxed [employer] [phone] for fmla for Rock to take are of [employer].

## 2024-02-12 NOTE — Progress Notes (Signed)
..  Complex Care Management   02/12/2024  Name: [name]  MRN: [id number]  DOB: 07-Nov-1950  Inbound call received from Ubaldo Bohr after receiving an Interactive Voice Response (IVR) call after a recent hospitalization. Information was provided about Care Management services.  Follow up plan: No further follow up required   Leita Lyme, BLANCH CAULK [facility]  [facility], [facility] Care Management Assistant [phone]

## 2024-02-17 ENCOUNTER — Ambulatory Visit: Admitting: Podiatry

## 2024-02-24 ENCOUNTER — Encounter

## 2024-02-24 ENCOUNTER — Ambulatory Visit (HOSPITAL_COMMUNITY)

## 2024-03-10 ENCOUNTER — Ambulatory Visit: Admitting: Physician Assistant

## 2024-03-23 ENCOUNTER — Ambulatory Visit: Admitting: Gastroenterology

## 2024-03-24 ENCOUNTER — Ambulatory Visit: Admitting: Gastroenterology

## 2024-04-06 ENCOUNTER — Ambulatory Visit: Admitting: Physician Assistant

## 2024-04-07 ENCOUNTER — Ambulatory Visit: Admitting: Podiatry
